# Patient Record
Sex: Female | Born: 1980
Health system: Southern US, Community
[De-identification: ages and names within clinical notes are randomized; demographics above are authoritative.]

## PROBLEM LIST (undated history)

## (undated) DIAGNOSIS — K429 Umbilical hernia without obstruction or gangrene: Secondary | ICD-10-CM

## (undated) DIAGNOSIS — J189 Pneumonia, unspecified organism: Secondary | ICD-10-CM

## (undated) DIAGNOSIS — M779 Enthesopathy, unspecified: Secondary | ICD-10-CM

## (undated) DIAGNOSIS — M1990 Unspecified osteoarthritis, unspecified site: Secondary | ICD-10-CM

## (undated) DIAGNOSIS — K2970 Gastritis, unspecified, without bleeding: Secondary | ICD-10-CM

## (undated) DIAGNOSIS — G2581 Restless legs syndrome: Secondary | ICD-10-CM

## (undated) DIAGNOSIS — Q909 Down syndrome, unspecified: Secondary | ICD-10-CM

## (undated) DIAGNOSIS — K219 Gastro-esophageal reflux disease without esophagitis: Secondary | ICD-10-CM

## (undated) DIAGNOSIS — K589 Irritable bowel syndrome without diarrhea: Secondary | ICD-10-CM

## (undated) DIAGNOSIS — E039 Hypothyroidism, unspecified: Secondary | ICD-10-CM

## (undated) DIAGNOSIS — Z9989 Dependence on other enabling machines and devices: Secondary | ICD-10-CM

## (undated) DIAGNOSIS — R519 Headache, unspecified: Secondary | ICD-10-CM

## (undated) DIAGNOSIS — K760 Fatty (change of) liver, not elsewhere classified: Secondary | ICD-10-CM

## (undated) DIAGNOSIS — G4730 Sleep apnea, unspecified: Secondary | ICD-10-CM

## (undated) DIAGNOSIS — N390 Urinary tract infection, site not specified: Secondary | ICD-10-CM

## (undated) DIAGNOSIS — R51 Headache: Secondary | ICD-10-CM

## (undated) DIAGNOSIS — Z8489 Family history of other specified conditions: Secondary | ICD-10-CM

## (undated) DIAGNOSIS — E079 Disorder of thyroid, unspecified: Secondary | ICD-10-CM

## (undated) DIAGNOSIS — L732 Hidradenitis suppurativa: Secondary | ICD-10-CM

## (undated) HISTORY — PX: HERNIA REPAIR: SHX51

## (undated) HISTORY — PX: AXILLARY HIDRADENITIS EXCISION: SUR522

## (undated) HISTORY — DX: Gastritis, unspecified, without bleeding: K29.70

## (undated) HISTORY — PX: TONSILLECTOMY: SUR1361

## (undated) HISTORY — DX: Disorder of thyroid, unspecified: E07.9

## (undated) HISTORY — DX: Down syndrome, unspecified: Q90.9

## (undated) HISTORY — DX: Umbilical hernia without obstruction or gangrene: K42.9

## (undated) HISTORY — DX: Enthesopathy, unspecified: M77.9

## (undated) HISTORY — PX: KNEE ARTHROSCOPY: SHX127

## (undated) HISTORY — PX: CHOLECYSTECTOMY: SHX55

## (undated) HISTORY — DX: Hidradenitis suppurativa: L73.2

## (undated) HISTORY — DX: Fatty (change of) liver, not elsewhere classified: K76.0

---

## 2007-09-07 ENCOUNTER — Encounter: Admission: RE | Admit: 2007-09-07 | Discharge: 2007-09-07 | Payer: Self-pay | Admitting: General Surgery

## 2007-10-19 ENCOUNTER — Inpatient Hospital Stay (HOSPITAL_COMMUNITY): Admission: RE | Admit: 2007-10-19 | Discharge: 2007-10-26 | Payer: Self-pay | Admitting: General Surgery

## 2007-10-19 ENCOUNTER — Ambulatory Visit: Payer: Self-pay | Admitting: Internal Medicine

## 2008-04-25 ENCOUNTER — Ambulatory Visit (HOSPITAL_COMMUNITY): Admission: RE | Admit: 2008-04-25 | Discharge: 2008-04-25 | Payer: Self-pay | Admitting: General Surgery

## 2008-06-20 ENCOUNTER — Encounter (INDEPENDENT_AMBULATORY_CARE_PROVIDER_SITE_OTHER): Payer: Self-pay | Admitting: General Surgery

## 2008-06-20 ENCOUNTER — Ambulatory Visit (HOSPITAL_COMMUNITY): Admission: RE | Admit: 2008-06-20 | Discharge: 2008-06-20 | Payer: Self-pay | Admitting: General Surgery

## 2009-11-17 ENCOUNTER — Encounter
Admission: RE | Admit: 2009-11-17 | Discharge: 2009-11-17 | Payer: Self-pay | Source: Home / Self Care | Attending: Emergency Medicine | Admitting: Emergency Medicine

## 2010-02-20 ENCOUNTER — Encounter: Admit: 2010-02-20 | Payer: Self-pay | Admitting: Emergency Medicine

## 2010-03-13 ENCOUNTER — Encounter: Payer: Medicare Other | Attending: Emergency Medicine | Admitting: *Deleted

## 2010-03-13 DIAGNOSIS — E119 Type 2 diabetes mellitus without complications: Secondary | ICD-10-CM | POA: Insufficient documentation

## 2010-03-13 DIAGNOSIS — Z713 Dietary counseling and surveillance: Secondary | ICD-10-CM | POA: Insufficient documentation

## 2010-03-13 DIAGNOSIS — E669 Obesity, unspecified: Secondary | ICD-10-CM | POA: Insufficient documentation

## 2010-04-10 ENCOUNTER — Ambulatory Visit: Payer: Medicare Other | Attending: Family Medicine | Admitting: Physical Therapy

## 2010-04-10 DIAGNOSIS — M25519 Pain in unspecified shoulder: Secondary | ICD-10-CM | POA: Insufficient documentation

## 2010-04-10 DIAGNOSIS — M25619 Stiffness of unspecified shoulder, not elsewhere classified: Secondary | ICD-10-CM | POA: Insufficient documentation

## 2010-04-10 DIAGNOSIS — IMO0001 Reserved for inherently not codable concepts without codable children: Secondary | ICD-10-CM | POA: Insufficient documentation

## 2010-04-13 ENCOUNTER — Ambulatory Visit: Payer: Medicare Other | Admitting: Physical Therapy

## 2010-04-16 ENCOUNTER — Ambulatory Visit: Payer: Medicare Other | Admitting: Physical Therapy

## 2010-04-19 ENCOUNTER — Ambulatory Visit: Payer: Medicare Other | Admitting: Physical Therapy

## 2010-04-23 ENCOUNTER — Ambulatory Visit: Payer: Medicare Other | Admitting: Physical Therapy

## 2010-04-26 ENCOUNTER — Ambulatory Visit: Payer: Medicare Other | Admitting: Physical Therapy

## 2010-05-03 ENCOUNTER — Ambulatory Visit: Payer: Medicare Other | Admitting: Physical Therapy

## 2010-05-04 ENCOUNTER — Ambulatory Visit: Payer: Medicare Other | Admitting: Physical Therapy

## 2010-05-15 LAB — CBC
Hemoglobin: 13.8 g/dL (ref 12.0–15.0)
MCV: 88.8 fL (ref 78.0–100.0)
RBC: 4.72 MIL/uL (ref 3.87–5.11)
WBC: 8.7 10*3/uL (ref 4.0–10.5)

## 2010-05-15 LAB — DIFFERENTIAL
Basophils Relative: 2 % — ABNORMAL HIGH (ref 0–1)
Eosinophils Relative: 1 % (ref 0–5)
Monocytes Relative: 6 % (ref 3–12)
Neutrophils Relative %: 64 % (ref 43–77)
Smear Review: ADEQUATE

## 2010-05-15 LAB — BASIC METABOLIC PANEL
CO2: 22 mEq/L (ref 19–32)
Calcium: 9.1 mg/dL (ref 8.4–10.5)
Chloride: 106 mEq/L (ref 96–112)
Creatinine, Ser: 0.63 mg/dL (ref 0.4–1.2)
GFR calc Af Amer: >60 mL/min (ref >60)
Sodium: 139 mEq/L (ref 135–145)

## 2010-05-15 LAB — PREGNANCY, URINE: Preg Test, Ur: NEGATIVE

## 2010-05-17 LAB — DIFFERENTIAL
Eosinophils Relative: 1 % (ref 0–5)
Lymphocytes Relative: 25 % (ref 12–46)
Lymphs Abs: 2.2 10*3/uL (ref 0.7–4.0)
Monocytes Absolute: 0.6 10*3/uL (ref 0.1–1.0)
Monocytes Relative: 7 % (ref 3–12)

## 2010-05-17 LAB — CBC
HCT: 36.3 % (ref 36.0–46.0)
Hemoglobin: 11.9 g/dL — ABNORMAL LOW (ref 12.0–15.0)
RBC: 4.09 MIL/uL (ref 3.87–5.11)
WBC: 8.8 10*3/uL (ref 4.0–10.5)

## 2010-05-17 LAB — BASIC METABOLIC PANEL
Chloride: 101 mEq/L (ref 96–112)
GFR calc non Af Amer: >60 mL/min (ref >60)
Potassium: 3.7 mEq/L (ref 3.5–5.1)
Sodium: 137 mEq/L (ref 135–145)

## 2010-05-17 LAB — PREGNANCY, URINE: Preg Test, Ur: NEGATIVE

## 2010-06-19 NOTE — Op Note (Signed)
NAME:  Oneill, Wanda                ACCOUNT NO.:  [id number]   MEDICAL RECORD NO.:  [id number]          PATIENT TYPE:  AMB   LOCATION:  DAY                          FACILITY:  [facility]   PHYSICIAN:  Juanetta Gosling, MDDATE OF BIRTH:  1980/04/11   DATE OF PROCEDURE:  06/20/2008  DATE OF DISCHARGE:                               OPERATIVE REPORT   PREOPERATIVE DIAGNOSIS:  Chronic left axillary hidradenitis.   POSTOPERATIVE DIAGNOSIS:  Chronic left axillary hidradenitis.   PROCEDURE:  Excision left axillary hidradenitis..   SURGEON:  Troy Sine. Dwain Sarna, M.D.   ASSISTANT:  None.   ANESTHESIA:  General.   SPECIMENS:  Axillary skin and subcutaneous tissue to pathology.   ESTIMATED BLOOD LOSS:  Minimal.   COMPLICATIONS:  None.   DRAINS:  None.   SUPERVISING ANESTHESIOLOGIST:  Quentin Cornwall. Council Mechanic, M.D.   DISPOSITION:  To recovery room in stable condition.   HISTORY:  [name] is a 30 year old female well known to me from a  laparoscopic ventral hernia repair in September 2009.  She has an area  of chronic hidradenitis in her left axilla that bothers her frequently  as well as cosmetically.  She and I, along with her parents, had a long  discussion about excising this area.  She was previously cancelled for  an upper respiratory tract infection, but on the day of surgery is doing  very well.  We discussed the risks and benefits of this procedure and  plan for an excision of her left axillary hidradenitis.   PROCEDURE:  After informed consent was obtained, the patient was first  brought to the holding area.  She had some significant difficulty in  obtaining an IV.  It was recommended by Anesthesia the next time she  undergoes the procedure that she have a PICC line or some other line  placed preoperatively.  Following this, she was then administered 400 mg  of ciprofloxacin and taken to the operating room where sequential  compression devices were placed on her lower extremities  prior to  induction.  She underwent general anesthesia without complication.  Her  left axilla and arm was then prepped with ChloraPrep and 3 minutes were  allowed to pass.  The area was then draped in the standard sterile  surgical fashion.  Surgical time-out was then performed.   An elliptical incision was then made to encompass the entire area that  appeared affected by the hidradenitis.  This was about a 13 x 5-cm area.  This area was excised down to the level of the fascia and the skin and  all the subcutaneous tissue was removed.  This appeared to remove all of  her disease.  This was then washed out.  Layered closure with 2-0 Vicryl  was then performed and the skin was then closed with multiple  interrupted vertical mattress 2-0 nylon sutures.  Bacitracin was applied  to the wound.  Ten mL of 0.25% Marcaine was infiltrated.  A sterile  dressing was then placed over her arm.  She tolerated this well, was  extubated in the operating room, and transferred to  PACU in stable  condition.      Juanetta Gosling, MD  Electronically Signed     MCW/MEDQ  D:  06/20/2008  T:  06/20/2008  Job:  [id number]   cc:   Clydie Braun L. Hal Hope, M.D.  Fax: [phone]

## 2010-06-19 NOTE — Op Note (Signed)
NAME:  Wanda Oneill, Wanda Oneill                ACCOUNT NO.:  [id number]   MEDICAL RECORD NO.:  [id number]          PATIENT TYPE:  INP   LOCATION:  1531                         FACILITY:  [facility]   PHYSICIAN:  Juanetta Gosling, MDDATE OF BIRTH:  1980-12-09   DATE OF PROCEDURE:  10/19/2007  DATE OF DISCHARGE:                               OPERATIVE REPORT   PREOPERATIVE DIAGNOSIS:  Incisional hernia.   POSTOPERATIVE DIAGNOSIS:  Incisional hernia x2.   PROCEDURE:  Laparoscopic ventral hernia repair with a Proceed mesh and  lysis of adhesions times 30 minutes.   SURGEON:  Juanetta Gosling, MD   ASSISTANT:  Clovis Pu. Cornett, M.D.   ANESTHESIA:  General.   FINDINGS:  Two small ventral hernias, one at the site of her prior  Ventralex hernia repair.   SPECIMENS:  None.   DRAINS:  None.   ESTIMATED BLOOD LOSS:  Minimal.   COMPLICATIONS:  None.   DISPOSITION:  To PACU in stable condition.   INDICATIONS:  This is a 30 year old with a past surgical history  significant for 3 abdominal hernia repairs, two of which she reports  were open and one laparoscopic.  She has had mesh placed in at least 2  of those.  She has Ventralex placed in one of the last operations.  Over  the last 3 years she has had increase in size of the supraumbilical  hernia and  feels some discomfort, no trouble with her bowel movements.  She does take  for some occasional complaints Bentyl.  She underwent a  CT scan that showed a small 2.5 cm supraumbilical hernia.  I counseled  her for laparoscopic ventral hernia repair with mesh with lysis of  adhesions.  We also discussed this may not cure her of some of the  symptoms she is having.   DESCRIPTION OF PROCEDURE:  After informed consent was obtained, the  patient was taken to the operating room.  She was administered 1 g of  vancomycin due to her penicillin allergy.  In the operating room  sequential compression devices were placed on her lower extremities and  then she was placed under general endotracheal anesthesia without  complication.  A Foley catheter and orogastric catheter were both  placed.  Her abdomen was prepped and draped in standard sterile surgical  fashion.  A surgical time out was then performed.  I then made a 5 mm  incision in her left upper quadrant and then I used the OptiVu trocar  and gained access to the abdomen without difficulty.  Her abdomen was  then insufflated to 15 mmHg pressure.  The camera was then inserted and  then I inserted a further 10 mm trocar into the left midabdomen under  direct vision after infiltration of local anesthetic without  complication.  Another 5 mm port was then placed in the left lower  quadrant after infiltration with local anesthetic without complication.  She was noted to have some adhesions to her abdominal wall.  These were  taken down with a combination of blunt dissection and sharp dissection  for approximately 30 minutes.  There was a small hernia noted just above  her umbilicus that was about 1.5 cm in size.  She was also noted to have  a small hernia just superior to her prior Ventralex repair.  I then used  the scissors to cut out the Ventralex mesh which was very scarred in and  difficult.  This was then removed through the 10 mm left midabdominal  trocar with some difficulty as this was very scarred and it was  basically a hard mass at that point.  Upon completion of this, I then  took the falciform ligament down with electrocautery.  There were two  defects approximately 4 cm apart. They were measured and I elected to  place an 8 x 10 inch Proceed mesh which would give me at least 4 cm  overlap in all directions.  I then placed 4-0 Prolene sutures in the  cardinal positions.  This was then placed intra-abdominally and  unrolled. The pressure was brought down to 7 and the position of the  mesh was then marked on the external portion of the abdomen.  Four stab  incisions were  made in the cardinal positions and a Storz suture passer  was then used to bring the Prolene suture through the abdominal wall.  Upon pulling these up, they were noted to be in good position.  The mesh  lay flat.  They were then tied down.  The Protacker was then used to  tack all around the edges in the double crown method.  The mesh was laid  flat and was in good position upon the completion of this.  To perform  the tacking portion, I did place one extra 5 mm trocar in her right mid  abdomen without difficulty.  Upon completion of this the camera was  moved to the right side.  I used a 0 Vicryl and the Endo close device to  then close the 10 mm trocar without difficulty.  I then looked around  the abdomen and there was no evidence of any injury from access and  hemostasis was observed.  The abdomen was then desufflated.  The trocars  were then removed without difficulty.  The trocar sites were then closed  with 4-0 Monocryl in the subcuticular fashion.  Dermabond was used then  to close all of these as well as the stab incisions from the sutures.  She tolerated this well, was extubated in the operating room.  Foley  catheter was removed.  She was transferred to the PACU in stable  condition.      Juanetta Gosling, MD  Electronically Signed     MCW/MEDQ  D:  10/19/2007  T:  10/20/2007  Job:  [id number]

## 2010-06-22 ENCOUNTER — Ambulatory Visit (INDEPENDENT_AMBULATORY_CARE_PROVIDER_SITE_OTHER): Payer: Medicare Other | Admitting: Family Medicine

## 2010-06-22 ENCOUNTER — Encounter: Payer: Self-pay | Admitting: Family Medicine

## 2010-06-22 DIAGNOSIS — G5750 Tarsal tunnel syndrome, unspecified lower limb: Secondary | ICD-10-CM

## 2010-06-22 DIAGNOSIS — R269 Unspecified abnormalities of gait and mobility: Secondary | ICD-10-CM

## 2010-06-22 DIAGNOSIS — M76829 Posterior tibial tendinitis, unspecified leg: Secondary | ICD-10-CM | POA: Insufficient documentation

## 2010-06-22 NOTE — Progress Notes (Signed)
  Subjective:    Patient ID: Wanda Oneill, female    DOB: 29-May-1980, 30 y.o.   MRN: [id number]  HPI 30 year old female with a history of Down's syndrome presents for evaluation of left foot and ankle pain. Her father is Derotha Fishbaugh, who is one of the urgent care doctors at [facility] urgent care.  Pain is in the left medial ankle and foot and has been present for about 2 weeks. She states she thinks she may have had some sort of slipping incident that stressed that part of her foot while she was at work. 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 Since then she has had gradual worsening pain in the left midfoot region that is worse with prolonged standing and walking and better with rest. It is a sharp throbbing pain. It does not wake her up at night. She's not having associated swelling, numbness, or tingling. She was initially evaluated by Dr. Warner Mccreedy at the urgent care and had x-rays, who subsequently referred her over here.  PMH: History of Down syndrome Social history: Works at [employer]. Does not smoke or drink. She lives by herself in an apartment.  Review of Systems Denies fever, sweats, chills, weight loss, shortness of breath, chest pain, numbness, tingling    Objective:   Physical Exam Gen. appearance well-appearing female no distress Neck: Supple Feet: She has mild pes cavus bilaterally and extremely broad forefoot. She has a fairly large gap between her first and second toes bilaterally. She has significant tenderness to palpation in the left medial midfoot in the region of the posterior tibial tendon insertion onto the navicular bone. She has no pain in the medial ankle. She has normal ankle range of motion and function. She is able to go up on both her toes with little pain on the left side. Gait: With walking, she has significant midfoot collapse left greater than right.  MSK [place]: Left foot and ankle shows normal posterior tibial tendon posterior to the medial malleolus. At the  insertion of the tendon onto the navicular bone, there is some mild edema and significant amount of increased Doppler flow. There is no evidence of cortical irregularity of the navicular bone.       Assessment & Plan:  Left midfoot pain, most likely insertional posterior tibial tendinitis - Given her amount of pain, we gave her a short Cam Walker boot - will also make custom orthotics to support her feet and I recommended she put the left orthotic in her boot for the time being -She can continue using the Voltaren gel that her dad has been giving her occasionally - we'll keep her out of work for next week, which is fortunate since she is going on vacation anyway for most of that time. She will return to work on May 29. - will followup with her in about 2-3 weeks to see how she's doing  Patient was fitted for a : standard, cushioned, semi-rigid orthotic. The orthotic was heated and afterward the patient stood on the orthotic blank positioned on the orthotic stand. The patient was positioned in subtalar neutral position and 10 degrees of ankle dorsiflexion in a weight bearing stance. After completion of molding, a stable base was applied to the orthotic blank. The blank was ground to a stable position for weight bearing. Size: 6 blue swirl Base: F3 plastic Posting: none Additional orthotic padding: none

## 2010-06-22 NOTE — Discharge Summary (Signed)
NAME:  Wanda Oneill, Wanda Oneill                ACCOUNT NO.:  [id number]   MEDICAL RECORD NO.:  [id number]          PATIENT TYPE:  INP   LOCATION:  1402                         [facility]   PHYSICIAN:  Juanetta Gosling, MDDATE OF BIRTH:  Jun 19, 1980   DATE OF ADMISSION:  10/19/2007  DATE OF DISCHARGE:  10/26/2007                               DISCHARGE SUMMARY   Wanda Oneill is a 30 year old female with  Down syndrome, who was admitted on  October 19, 2007 for repair of ventral hernia.  She has a prior  history of ventral hernia repairs and had a very symptomatic hernia.  She was admitted, underwent a laparoscopic ventral hernia repair with 8  x 10 inch Porcine mesh inserted, and also had extensive lysis of  adhesions and removal of a prior Ventralex patch that was in place.   Postoperatively, she had difficulty maintaining her oxygen saturations.  I obtained a pulmonary consultation from Dr. Sherene Sires.  She was eventually  begun on antibiotics for presumed pneumonia after undergoing a CT angio  of her chest, which showed no evidence of a pulmonary embolus.  Eventually with pulmonary toilet, she began to have better oxygen  saturations and was tolerating more of her activity.  Her diet was  advanced slowly to the point on postop day #7, she was able to go home  with  home oxygen set up.  Her wounds remained clean and her bowel  function had returned to normal by the time of her discharge.   PROCEDURES:  Laparoscopic ventral hernia repair with 8 x 10 Porcine  mesh.   CONSULTATIONS:  Pulmonary, Casimiro Needle B. Sherene Sires, MD, FCCP.   DISCHARGE:  Patient was doing fair and was discharged on Vicodin 5/500  one every 4 hours with the instructions to return in 1 week for  followup.      Juanetta Gosling, MD  Electronically Signed     MCW/MEDQ  D:  11/25/2007  T:  11/25/2007  Job:  [id number]

## 2010-07-06 ENCOUNTER — Ambulatory Visit (INDEPENDENT_AMBULATORY_CARE_PROVIDER_SITE_OTHER): Payer: Medicare Other | Admitting: Family Medicine

## 2010-07-06 ENCOUNTER — Encounter: Payer: Self-pay | Admitting: Family Medicine

## 2010-07-06 VITALS — BP 128/83 | HR 70

## 2010-07-06 DIAGNOSIS — M76829 Posterior tibial tendinitis, unspecified leg: Secondary | ICD-10-CM

## 2010-07-06 NOTE — Progress Notes (Signed)
  Subjective:    Patient ID: Wanda Oneill, female    DOB: [birth year], 30 y.o.   MRN: [id number]  HPI 30 year old female with history of Down syndrome here to followup on her left posterior tibial tendinitis. We made her orthotics at last visit and also put her in a Personal assistant. She's been back to work at [employer] 2 days this week, and had difficulty standing the entire 4 hours. She is with her mom today, and both feel the boot is hindering her work. She states the scaphoid pad we put on the right orthotic is causing more pain. She states otherwise her left medial foot and ankle does feel better as long as she is not standing on it.   Review of Systems Denies fever, sweats, chills, weight loss    Objective:   Physical Exam General appearance: Obese female no distress Left foot: Only mild tenderness along the medial midfoot at the insertion of the posterior tibial tendon at the navicular. No overlying erythema or warmth. Only minimal edema in the foot.       Assessment & Plan:  Posterior tibial tendinitis, improving -We took the right scaphoid pad off also since it was causing pain -She'll start working her way out of the [employer] -At this point she can followup with [place] as needed if her pain does not continue to improve or gets worse - Stress the importance overall continuing to use her custom orthotics in her normal tennis shoes

## 2010-08-21 ENCOUNTER — Ambulatory Visit (INDEPENDENT_AMBULATORY_CARE_PROVIDER_SITE_OTHER): Payer: Medicare Other | Admitting: Sports Medicine

## 2010-08-21 ENCOUNTER — Encounter: Payer: Self-pay | Admitting: Sports Medicine

## 2010-08-21 VITALS — BP 139/83 | HR 71

## 2010-08-21 DIAGNOSIS — M76829 Posterior tibial tendinitis, unspecified leg: Secondary | ICD-10-CM

## 2010-08-21 NOTE — Progress Notes (Signed)
PT referral faxed to [facility] at [facility] per pt request.

## 2010-08-21 NOTE — Progress Notes (Signed)
  Subjective:    Patient ID: Wanda Oneill, female    DOB: 1980/02/26, 30 y.o.   MRN: [id number]  HPI Posterior tibial tendinitis: Vicy initially noticed this injury in May of this year. She came to the sports medicine Center and was diagnosed with posterior tibial tendinitis.  Musculoskeletal ultrasound at that time showed edema around the posterior tibial tendon at its insertion in the navicular. She was placed in a CAM boot, given topical Voltaren, and custom orthotics were made. She was initially doing better and came to see [place] in June. At that point she was taken out of her cam boot. Unfortunately her pain has worsened, and at this point she experiences at both at work and at home. She notes that her pain occurs after approximately one hour of standing. She is is compliant with her orthotics. And she was very compliant through May with her cam boot. She has not done any formal therapy yet.   Review of Systems    negative except as noted above in the history of present illness. Objective:   Physical Exam    general: Well-developed, obese, Caucasian female. Musculoskeletal exam:   L Ankle: No visible erythema or swelling. Range of motion is full in all directions. Strength is 5/5 in all directions. We do reproduce pain with resisted inversion. She has no pain with great toe plantar flexion or small toe plantar flexion. Stable lateral and medial ligaments; squeeze test and kleiger test unremarkable; Talar dome nontender; No pain at base of 5th MT; No tenderness over cuboid; The patient has some tenderness over the navicular insertion of the posterior tibialis. No tenderness on posterior aspects of lateral and medial malleolus No sign of peroneal tendon subluxations or tenderness to palpation Negative tarsal tunnel tinel's  When viewed from the front, and standing, I note that her orthotics do not sufficiently control pronation of her left foot. I removed the existing scaphoid pad  from her left orthotic, and placed a larger felt scaphoid pad. This appeared to better control her pronation.  Assessment & Plan:

## 2010-08-21 NOTE — Assessment & Plan Note (Signed)
I suspect that she will need more time in orthosis that will control excess pronation. We will go ahead and place her in an ankle stabilizing orthosis, and she will wear this with her her newly builtup orthotics in her daily walking shoes. At this point I will place her in formal physical therapy for eccentric rehabilitation of her posterior tibialis tendon. She may also continue her oral ibuprofen and topical Voltaren. I would like to see her back in the office in one month to reassess her progress. Should she still had insufficient improvement in her symptoms, I would like to rescan her and likely get her back into her cam boot.

## 2010-08-21 NOTE — Patient Instructions (Signed)
Great to meet you today. I suspect that you have worsening of your posterior tibial tendinitis. We built up your orthotic today We will put you in an ASO brace today. I would also like you to see the physical therapist. Continue using the ibuprofen and topical Voltaren. I would like to see you back in one month to see how you are doing. Wanda Oneill. Benjamin Stain, M.D.

## 2010-08-28 ENCOUNTER — Ambulatory Visit: Payer: Medicare Other | Attending: Emergency Medicine | Admitting: Physical Therapy

## 2010-08-28 DIAGNOSIS — IMO0001 Reserved for inherently not codable concepts without codable children: Secondary | ICD-10-CM | POA: Insufficient documentation

## 2010-08-28 DIAGNOSIS — M25579 Pain in unspecified ankle and joints of unspecified foot: Secondary | ICD-10-CM | POA: Insufficient documentation

## 2010-08-28 DIAGNOSIS — M25673 Stiffness of unspecified ankle, not elsewhere classified: Secondary | ICD-10-CM | POA: Insufficient documentation

## 2010-08-28 DIAGNOSIS — R269 Unspecified abnormalities of gait and mobility: Secondary | ICD-10-CM | POA: Insufficient documentation

## 2010-08-28 DIAGNOSIS — M25676 Stiffness of unspecified foot, not elsewhere classified: Secondary | ICD-10-CM | POA: Insufficient documentation

## 2010-09-03 ENCOUNTER — Ambulatory Visit: Payer: Medicare Other | Admitting: Physical Therapy

## 2010-09-07 ENCOUNTER — Ambulatory Visit: Payer: Medicare Other | Attending: Emergency Medicine | Admitting: Physical Therapy

## 2010-09-07 DIAGNOSIS — IMO0001 Reserved for inherently not codable concepts without codable children: Secondary | ICD-10-CM | POA: Insufficient documentation

## 2010-09-07 DIAGNOSIS — M25673 Stiffness of unspecified ankle, not elsewhere classified: Secondary | ICD-10-CM | POA: Insufficient documentation

## 2010-09-07 DIAGNOSIS — M25676 Stiffness of unspecified foot, not elsewhere classified: Secondary | ICD-10-CM | POA: Insufficient documentation

## 2010-09-07 DIAGNOSIS — R269 Unspecified abnormalities of gait and mobility: Secondary | ICD-10-CM | POA: Insufficient documentation

## 2010-09-07 DIAGNOSIS — M25579 Pain in unspecified ankle and joints of unspecified foot: Secondary | ICD-10-CM | POA: Insufficient documentation

## 2010-09-17 ENCOUNTER — Ambulatory Visit: Payer: Medicare Other | Admitting: Physical Therapy

## 2010-09-18 ENCOUNTER — Ambulatory Visit (INDEPENDENT_AMBULATORY_CARE_PROVIDER_SITE_OTHER): Payer: Medicare Other | Admitting: Sports Medicine

## 2010-09-18 ENCOUNTER — Encounter: Payer: Self-pay | Admitting: Sports Medicine

## 2010-09-18 VITALS — BP 109/76 | HR 70

## 2010-09-18 DIAGNOSIS — M76829 Posterior tibial tendinitis, unspecified leg: Secondary | ICD-10-CM

## 2010-09-18 MED ORDER — NITROGLYCERIN 0.2 MG/HR TD PT24: MEDICATED_PATCH | TRANSDERMAL | Status: DC

## 2010-09-18 NOTE — Progress Notes (Signed)
  Subjective:    Patient ID: Wanda Oneill, female    DOB: 08-11-1980, 30 y.o.   MRN: [id number]  HPI Left posterior tibial tendinitis: Wanda Oneill initially noticed this injury in May of this year. She came to the sports medicine Center and was diagnosed with posterior tibial tendinitis.  Musculoskeletal ultrasound at that time showed edema around the posterior tibial tendon at its insertion in the navicular. She was placed in a CAM boot, given topical Voltaren, and custom orthotics were made. She was initially doing better and came to see [place] in June, she had been in the boot for less than a month. At that point she was taken out of her cam boot. I put her in anankle stabilizing orthosis, and referred her for formal physical therapy. She's been doing therapy for about 2 times per week now.  She notes that her pain is really overall not improved.    Review of Systems    negative except as noted above in the history of present illness Objective:   Physical Exam  L Ankle: No visible erythema or swelling. Range of motion is full in all directions. Strength is 5/5 in all directions. We do reproduce pain with resisted inversion. She has no pain with great toe plantar flexion or small toe plantar flexion. Stable lateral and medial ligaments; squeeze test and kleiger test unremarkable; Talar dome nontender; No pain at base of 5th MT; No tenderness over cuboid; No tenderness noted over the dorsal navicular The patient has some tenderness over the navicular insertion of the posterior tibialis. No tenderness on posterior aspects of lateral and medial malleolus No sign of peroneal tendon subluxations or tenderness to palpation Negative tarsal tunnel tinel's.    musculoskeletal ultrasonography: The posterior tibialis tendon still has some edema, however there are no tears or gaps in the tendon through its entire course. At its insertion in the navicular she does have some surrounding edema with signs of  periostitis with Doppler imaging over the navicular. When compared with previous images, the increased Doppler signal is greatly improved. I also did not seek neovessels in the tendon as were previously noted Assessment & Plan:

## 2010-09-18 NOTE — Assessment & Plan Note (Addendum)
Unfortunately she has not progressed as expected. I will put her back in the boot for 4 weeks, she may do physical therapy while in the boot. One quarter of a nitroglycerin patch to affected area daily A would like to see her back at that point and we will continue with therapy, and orthotics as before. Should she still not be doing better, it would be prudent to obtain an MRI of the left ankle, I am not that suspicious right now for a navicular stress fracture as the symptoms are not present on the dorsum of the foot, an MRI would further delineate this.

## 2010-09-19 ENCOUNTER — Ambulatory Visit: Payer: Medicare Other | Admitting: Physical Therapy

## 2010-09-25 ENCOUNTER — Ambulatory Visit: Payer: Medicare Other | Admitting: Physical Therapy

## 2010-09-27 ENCOUNTER — Ambulatory Visit: Payer: Medicare Other | Admitting: Physical Therapy

## 2010-10-03 ENCOUNTER — Ambulatory Visit: Payer: Medicare Other | Admitting: Physical Therapy

## 2010-10-05 ENCOUNTER — Ambulatory Visit: Payer: Medicare Other | Admitting: Physical Therapy

## 2010-10-09 ENCOUNTER — Ambulatory Visit: Payer: Medicare Other | Attending: Emergency Medicine | Admitting: Physical Therapy

## 2010-10-09 DIAGNOSIS — IMO0001 Reserved for inherently not codable concepts without codable children: Secondary | ICD-10-CM | POA: Insufficient documentation

## 2010-10-09 DIAGNOSIS — R269 Unspecified abnormalities of gait and mobility: Secondary | ICD-10-CM | POA: Insufficient documentation

## 2010-10-09 DIAGNOSIS — M25673 Stiffness of unspecified ankle, not elsewhere classified: Secondary | ICD-10-CM | POA: Insufficient documentation

## 2010-10-09 DIAGNOSIS — M25676 Stiffness of unspecified foot, not elsewhere classified: Secondary | ICD-10-CM | POA: Insufficient documentation

## 2010-10-09 DIAGNOSIS — M25579 Pain in unspecified ankle and joints of unspecified foot: Secondary | ICD-10-CM | POA: Insufficient documentation

## 2010-10-12 ENCOUNTER — Ambulatory Visit: Payer: Medicare Other | Admitting: Physical Therapy

## 2010-10-15 ENCOUNTER — Ambulatory Visit: Payer: Medicare Other | Admitting: Physical Therapy

## 2010-10-16 ENCOUNTER — Encounter: Payer: Self-pay | Admitting: Sports Medicine

## 2010-10-16 ENCOUNTER — Ambulatory Visit (INDEPENDENT_AMBULATORY_CARE_PROVIDER_SITE_OTHER): Payer: Medicare Other | Admitting: Sports Medicine

## 2010-10-16 VITALS — BP 127/77

## 2010-10-16 DIAGNOSIS — M76829 Posterior tibial tendinitis, unspecified leg: Secondary | ICD-10-CM

## 2010-10-16 NOTE — Assessment & Plan Note (Addendum)
Still not completely better after placing her in the boot for an additional month. I would have expected resolution of her posterior tibialis tendinitis by this point. She does have some tenderness over the navicular insertion of the posterior tibialis tendon, go ahead and get an MRI of the left foot to assess for a navicular stress fracture. She will continue using the nitroglycerin, and keep in the boot. Write her another note for 4 weeks out of work. She will continue physical therapy. She will come back to see me in one week to go over the results of the MRI. Should she have signs of a navicular stress fracture, and failure of therapy in the cam boot, I would put her in a formal cast.

## 2010-10-16 NOTE — Progress Notes (Deleted)
  Subjective:    Patient ID: Wanda Oneill, female    DOB: 26-Jun-1980, 30 y.o.   MRN: [id number]  HPI    Review of Systems     Objective:   Physical Exam        Assessment & Plan:   Subjective:    Patient ID: Wanda Oneill, female    DOB: 1980/03/28, 30 y.o.   MRN: [id number]  HPI Left posterior tibial tendinitis: Wanda Oneill returns for followup of this injury which was first noticed in May of this year. She came [facility] the [facility] and was diagnosed with posterior tibial tendinitis.  Musculoskeletal ultrasound at that time showed edema around the posterior tibial tendon at its insertion in the navicular. She was placed in a CAM boot, given topical Voltaren, and custom orthotics were made. She was initially doing better and came to see [place] in June, she had been in the boot for less than a month. At that point she was taken out of her cam boot. I put her in an ankle stabilizing orthosis at that point, and referred her for formal physical therapy. I then saw her approximately 1 month ago and she was not improved I put her in a boot again for an additional 4 weeks and we did an additional 4 weeks of physical therapy. She returns today approximately 40% better. She's been out of work, and is also been using the nitroglycerin patch.  Review of Systems    negative except as noted above in the history of present illness Objective:   Physical Exam  L Ankle: No visible erythema or swelling. Range of motion is full in all directions. Strength is 5/5 in all directions. We do reproduce pain with resisted inversion. She has no pain with great toe plantar flexion or small toe plantar flexion. Stable lateral and medial ligaments; squeeze test and kleiger test unremarkable; Talar dome nontender; No pain at base of 5th MT; No tenderness over cuboid; No tenderness noted over the dorsal navicular The patient still has some tenderness over the navicular insertion of the posterior  tibialis. No tenderness on posterior aspects of lateral and medial malleolus No sign of peroneal tendon subluxations or tenderness to palpation Negative tarsal tunnel tinel's.  Assessment & Plan:

## 2010-10-16 NOTE — Patient Instructions (Addendum)
Great to see you, Thanks for the brownies. MRI of the left foot. Continue physical therapy. Continue nitroglycerin patches. Make an appointment to see me in one week to go over the results of the MRI. MRI appt is Sept 17th at 10am at [facility].  Wanda Oneill. Benjamin Stain, M.D. [facility] [address] [place] [address] [phone]

## 2010-10-16 NOTE — Progress Notes (Signed)
  Subjective:    Patient ID: Wanda Oneill, female    DOB: [birth year], 30 y.o.   MRN: [id number]  HPI Left posterior tibial tendinitis: Wanda Oneill returns for followup of this injury which was first noticed in May of this year. She came to the sports medicine Center and was diagnosed with posterior tibial tendinitis.  Musculoskeletal ultrasound at that time showed edema around the posterior tibial tendon at its insertion in the navicular. She was placed in a CAM boot, given topical Voltaren, and custom orthotics were made. She was initially doing better and came to see [place] in June, she had been in the boot for less than a month. At that point she was taken out of her cam boot. I put her in an ankle stabilizing orthosis at that point, and referred her for formal physical therapy. I then saw her approximately 1 month ago and she was not improved I put her in a boot again for an additional 4 weeks and we did an additional 4 weeks of physical therapy. She returns today approximately 40% better. She's been out of work, and is also been using the nitroglycerin patch.  Review of Systems    negative except as noted above in the history of present illness Objective:   Physical Exam  L Ankle: No visible erythema or swelling. Range of motion is full in all directions. Strength is 5/5 in all directions. We do reproduce pain with resisted inversion. She has no pain with great toe plantar flexion or small toe plantar flexion. Stable lateral and medial ligaments; squeeze test and kleiger test unremarkable; Talar dome nontender; No pain at base of 5th MT; No tenderness over cuboid; No tenderness noted over the dorsal navicular The patient still has some tenderness over the navicular insertion of the posterior tibialis. No tenderness on posterior aspects of lateral and medial malleolus No sign of peroneal tendon subluxations or tenderness to palpation Negative tarsal tunnel tinel's.

## 2010-10-19 ENCOUNTER — Ambulatory Visit: Payer: Medicare Other | Admitting: Rehabilitation

## 2010-10-22 ENCOUNTER — Ambulatory Visit (HOSPITAL_COMMUNITY)
Admission: RE | Admit: 2010-10-22 | Discharge: 2010-10-22 | Disposition: A | Discharge status: Home / Self Care | Payer: Medicare Other | Source: Ambulatory Visit | Attending: Sports Medicine | Admitting: Sports Medicine

## 2010-10-22 DIAGNOSIS — M76829 Posterior tibial tendinitis, unspecified leg: Secondary | ICD-10-CM

## 2010-10-22 DIAGNOSIS — R609 Edema, unspecified: Secondary | ICD-10-CM | POA: Insufficient documentation

## 2010-10-22 DIAGNOSIS — M79609 Pain in unspecified limb: Secondary | ICD-10-CM | POA: Insufficient documentation

## 2010-10-22 DIAGNOSIS — M722 Plantar fascial fibromatosis: Secondary | ICD-10-CM | POA: Insufficient documentation

## 2010-10-23 ENCOUNTER — Ambulatory Visit: Payer: Medicare Other | Admitting: Physical Therapy

## 2010-10-23 ENCOUNTER — Ambulatory Visit (INDEPENDENT_AMBULATORY_CARE_PROVIDER_SITE_OTHER): Payer: Medicare Other | Admitting: Sports Medicine

## 2010-10-23 ENCOUNTER — Encounter: Payer: Self-pay | Admitting: Sports Medicine

## 2010-10-23 VITALS — BP 128/82

## 2010-10-23 DIAGNOSIS — M76829 Posterior tibial tendinitis, unspecified leg: Secondary | ICD-10-CM

## 2010-10-23 MED ORDER — PREDNISONE 20 MG PO TABS: 20.0000 mg | ORAL_TABLET | Freq: Two times a day (BID) | ORAL | Status: AC

## 2010-10-23 NOTE — Patient Instructions (Signed)
Out of work. Steroids 2x a day for 10 days. Check blood sugars daily while on steroids, call if >200. Come back to see me in 2 weeks. Airsport brace. Crutches. Ihor Austin. Benjamin Stain, M.D.

## 2010-10-23 NOTE — Progress Notes (Signed)
  Subjective:    Patient ID: Wanda Oneill, female    DOB: 1980-10-24, 30 y.o.   MRN: [id number]  HPI Left posterior tibial tendinitis: Wanda Oneill returns for followup of this injury which was first noticed in May of this year. She came to the sports medicine Center and was diagnosed with posterior tibial tendinitis.  Musculoskeletal ultrasound at that time showed edema around the posterior tibial tendon at its insertion in the navicular. She was placed in a CAM boot, given topical Voltaren, and custom orthotics were made. She was initially doing better and came to see [place] in June, she had been in the boot for less than a month. At that point she was taken out of her cam boot. I put her in an ankle stabilizing orthosis at that point, and referred her for formal physical therapy. I then saw her approximately 1.5 months ago and she was not improved I put her in a boot again for an additional 4 weeks and we did an additional 4 weeks of physical therapy. She returned approximately 40% better but still unable to work. She has also been using the nitroglycerin patch.  As this is been present for approximately 5 months, and her pain was predominantly at the insertion of the posterior tibialis tendon at the navicular, I had a concern for a navicular stress fracture. I obtained an MRI which we will review today.  She has had orthotics made sometime ago.   Review of Systems    thoroughly reviewed and negative with regards to the chief complaint Objective:   Physical Exam L Ankle: No visible erythema or swelling. Range of motion is full in all directions. Strength is 5/5 in all directions. We do reproduce pain with resisted inversion. She has no pain with great toe plantar flexion or small toe plantar flexion. Stable lateral and medial ligaments; squeeze test and kleiger test unremarkable; Talar dome nontender; No pain at base of 5th MT; No tenderness over cuboid; No tenderness noted over the dorsal  navicular The patient still has some tenderness over the navicular insertion of the posterior tibialis. No tenderness on posterior aspects of lateral and medial malleolus No sign of peroneal tendon subluxations or tenderness to palpation Negative tarsal tunnel tinel's.  MRI was reviewed independently by me and showed no stress injury to the navicular, there is radiographic plantar fasciitis, there is also posterior tibial tendinosis, with fluid around the sheath as well as increased T2 signal in the tendon substance itself.     Assessment & Plan:

## 2010-10-23 NOTE — Assessment & Plan Note (Signed)
We review the MRI results together, and I discussed the entire case with the patient's father Dr. Janace Hoard. I also discussed this case with Dr. Darrick Penna. I think it is reasonable to try an air sport brace to control excess pronation/supination. She will also try crutches. She may stop the nitroglycerin as there is no benefit yet. We will try a burst of prednisone, 20 mg twice a day for 10 days. She will check her blood sugars daily, and call us if they go over 200 during this period. She can continue physical therapy. They have requested that I see her back in a period of 2 weeks for another work note however I do not expect significant improvement in her symptoms at that point. Her father and mother have also requested that we consider a guided tendon sheath injection of corticosteroid should all of the above not provide any benefit. She would of course be nonweightbearing for at least one week if this procedure were done.

## 2010-10-24 ENCOUNTER — Ambulatory Visit: Payer: Medicare Other | Admitting: Physical Therapy

## 2010-10-29 ENCOUNTER — Ambulatory Visit: Payer: Medicare Other | Admitting: Physical Therapy

## 2010-10-31 ENCOUNTER — Ambulatory Visit: Payer: Medicare Other | Admitting: Physical Therapy

## 2010-11-05 ENCOUNTER — Ambulatory Visit: Payer: Medicare Other | Attending: Emergency Medicine | Admitting: Physical Therapy

## 2010-11-05 DIAGNOSIS — M25676 Stiffness of unspecified foot, not elsewhere classified: Secondary | ICD-10-CM | POA: Insufficient documentation

## 2010-11-05 DIAGNOSIS — M25579 Pain in unspecified ankle and joints of unspecified foot: Secondary | ICD-10-CM | POA: Insufficient documentation

## 2010-11-05 DIAGNOSIS — IMO0001 Reserved for inherently not codable concepts without codable children: Secondary | ICD-10-CM | POA: Insufficient documentation

## 2010-11-05 DIAGNOSIS — M25673 Stiffness of unspecified ankle, not elsewhere classified: Secondary | ICD-10-CM | POA: Insufficient documentation

## 2010-11-05 DIAGNOSIS — R269 Unspecified abnormalities of gait and mobility: Secondary | ICD-10-CM | POA: Insufficient documentation

## 2010-11-05 LAB — CBC
HCT: 26.3 — ABNORMAL LOW
HCT: 26.6 — ABNORMAL LOW
MCHC: 32.6
MCV: 89.2
MCV: 89.3
Platelets: 190
Platelets: 255
WBC: 12.2 — ABNORMAL HIGH
WBC: 12.8 — ABNORMAL HIGH

## 2010-11-05 LAB — BASIC METABOLIC PANEL
BUN: 4 — ABNORMAL LOW
BUN: 5 — ABNORMAL LOW
CO2: 30
CO2: 33 — ABNORMAL HIGH
Chloride: 100
Chloride: 102
Creatinine, Ser: 0.65
Potassium: 4.2

## 2010-11-06 ENCOUNTER — Encounter: Payer: Self-pay | Admitting: Sports Medicine

## 2010-11-06 ENCOUNTER — Ambulatory Visit (INDEPENDENT_AMBULATORY_CARE_PROVIDER_SITE_OTHER): Payer: Medicare Other | Admitting: Sports Medicine

## 2010-11-06 VITALS — BP 105/71 | HR 79

## 2010-11-06 DIAGNOSIS — M76829 Posterior tibial tendinitis, unspecified leg: Secondary | ICD-10-CM

## 2010-11-06 NOTE — Assessment & Plan Note (Signed)
Continue the air sport brace to control excess pronation/supination. May stop crutches. Continue physical therapy I will see her back in 3 months. I provided her a note to be out of work until November 5. We considered a guided tendon sheath injection of corticosteroid should her symptoms return, or her improvement plateau. She would of course be nonweightbearing for at least one week if this procedure were done.

## 2010-11-06 NOTE — Progress Notes (Signed)
  Subjective:    Patient ID: Wanda Oneill, female    DOB: 01/11/1981, 30 y.o.   MRN: [id number]  HPI Left posterior tibial tendinitis: Rielyn returns for followup of this injury which was first noticed in May of this year. She came to the sports medicine Center and was diagnosed with posterior tibial tendinitis.  Musculoskeletal ultrasound at that time showed edema around the posterior tibial tendon at its insertion in the navicular. She was placed in a CAM boot, given topical Voltaren, and custom orthotics were made. She was initially doing better and came to see [place] in June, she had been in the boot for less than a month. At that point she was taken out of her cam boot. I put her in an ankle stabilizing orthosis at that point, and referred her for formal physical therapy. I then saw her approximately 1.5 months ago and she was not improved I put her in a boot again for an additional 4 weeks and we did an additional 4 weeks of physical therapy. She returned approximately 40% better but still unable to work. She has also been using the nitroglycerin patch.  As this is been present for approximately 5 months, and her pain was predominantly at the insertion of the posterior tibialis tendon at the navicular, I had a concern for a navicular stress fracture. I obtained an MRI which was consistent with posterior tibial tendinopathy, and had no sign of stress fracture.  She has had orthotics made sometime ago.  We put her in an air sport brace, and gave her a burst of prednisone at the last visit. Today she returns and is significantly better.   Review of Systems    no fevers, chills, night sweats, weight loss Objective:   Physical Exam L Ankle: No visible erythema or swelling. Range of motion is full in all directions. Strength is 5/5 in all directions. We no longer reproduce pain with resisted inversion. She has no pain with great toe plantar flexion or small toe plantar flexion. Stable lateral and  medial ligaments; squeeze test and kleiger test unremarkable; Talar dome nontender; No pain at base of 5th MT; No tenderness over cuboid; No tenderness noted over the dorsal navicular The patient no longer has any tenderness over the navicular insertion of the posterior tibialis. No tenderness on posterior aspects of lateral and medial malleolus No sign of peroneal tendon subluxations or tenderness to palpation Negative tarsal tunnel tinel's.     Assessment & Plan:

## 2010-11-07 ENCOUNTER — Ambulatory Visit: Payer: Medicare Other | Admitting: Physical Therapy

## 2010-11-07 LAB — DIFFERENTIAL
Lymphocytes Relative: 32
Lymphs Abs: 2.5
Neutrophils Relative %: 60

## 2010-11-07 LAB — CBC
MCHC: 32.8
MCV: 88.2
RBC: 4.09

## 2010-11-07 LAB — COMPREHENSIVE METABOLIC PANEL
AST: 31
CO2: 28
Calcium: 9
Creatinine, Ser: 0.72
GFR calc Af Amer: >60
GFR calc non Af Amer: >60
Glucose, Bld: 113 — ABNORMAL HIGH

## 2010-11-07 LAB — URINE CULTURE
Colony Count: 25000
Special Requests: NEGATIVE

## 2010-11-07 LAB — URINALYSIS, ROUTINE W REFLEX MICROSCOPIC
Nitrite: NEGATIVE
Specific Gravity, Urine: 1.012
pH: 7

## 2010-11-07 LAB — PREGNANCY, URINE: Preg Test, Ur: NEGATIVE

## 2010-12-18 ENCOUNTER — Ambulatory Visit (INDEPENDENT_AMBULATORY_CARE_PROVIDER_SITE_OTHER): Payer: Medicare Other | Admitting: Sports Medicine

## 2010-12-18 VITALS — BP 118/77

## 2010-12-18 DIAGNOSIS — M76829 Posterior tibial tendinitis, unspecified leg: Secondary | ICD-10-CM

## 2010-12-18 MED ORDER — AIRCAST SPORT ANKLE BRACE/LEFT MISC: Status: DC

## 2010-12-18 NOTE — Progress Notes (Signed)
  Subjective:    Patient ID: Wanda Oneill, female    DOB: 08-Feb-1980, 30 y.o.   MRN: [id number]  HPI  Left posterior tibial tendinitis: Wanda Oneill returns for followup of this injury which was first noticed in May of this year. She came to the sports medicine Center and was diagnosed with posterior tibial tendinitis.  Musculoskeletal ultrasound at that time showed edema around the posterior tibial tendon at its insertion in the navicular. She was placed in a CAM boot, given topical Voltaren, and custom orthotics were made. She was initially doing better and came to see [place] in June, she had been in the boot for less than a month. At that point she was taken out of her cam boot. I put her in an ankle stabilizing orthosis at that point, and referred her for formal physical therapy. I then saw her approximately 1.5 months ago and she was not improved I put her in a boot again for an additional 4 weeks and we did an additional 4 weeks of physical therapy. She returned approximately 40% better but still unable to work. She has also been using the nitroglycerin patch.  As this is been present for approximately 5 months, and her pain was predominantly at the insertion of the posterior tibialis tendon at the navicular, I had a concern for a navicular stress fracture. I obtained an MRI which was consistent with posterior tibial tendinopathy, and had no sign of stress fracture.  She has had orthotics made sometime ago.  We put her in an air sport brace, and gave her a burst of prednisone at the last visit. She continues to do very well regarding her pain, and at this point has decided not to proceed with the posterior tibialis tendon sheath injection. 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 This will allow her to stay off of her feet, and she is really just not happy at [employer].  He is very happy with the Aircast. The problem is that his continue to wear down, and the support  material is now protruding. She would like a knot her Aircast, and a prescription for a custom Aircast to be made by local orthotist.   Review of Systems     no fevers, chills, night sweats, weight loss Objective:   Physical Exam  L Ankle: No visible erythema or swelling. Range of motion is full in all directions. Strength is 5/5 in all directions. We no longer reproduce pain with resisted inversion. She has no pain with great toe plantar flexion or small toe plantar flexion. Stable lateral and medial ligaments; squeeze test and kleiger test unremarkable; Talar dome nontender; No pain at base of 5th MT; No tenderness over cuboid; No tenderness noted over the dorsal navicular The patient no longer has any tenderness over the navicular insertion of the posterior tibialis. No tenderness on posterior aspects of lateral and medial malleolus No sign of peroneal tendon subluxations or tenderness to palpation Negative tarsal tunnel tinel's.     Assessment & Plan:

## 2010-12-18 NOTE — Assessment & Plan Note (Signed)
Continue the air sport brace to control excess pronation/supination. We again considered a guided tendon sheath injection of corticosteroid should her symptoms return, or her improvement plateau. She would of course be nonweightbearing for at least one week if this procedure were done. I've given her a new air sport brace. Have also given her a prescription for a custom Aircast to be made by local orthotist. We will see her back on an as-needed basis.

## 2010-12-26 ENCOUNTER — Other Ambulatory Visit: Payer: Self-pay | Admitting: Sports Medicine

## 2010-12-26 DIAGNOSIS — M76829 Posterior tibial tendinitis, unspecified leg: Secondary | ICD-10-CM

## 2010-12-26 MED ORDER — AIRCAST SPORT ANKLE BRACE/LEFT MISC: Status: DC

## 2011-01-08 ENCOUNTER — Ambulatory Visit (INDEPENDENT_AMBULATORY_CARE_PROVIDER_SITE_OTHER): Payer: Medicare Other | Admitting: General Surgery

## 2011-01-08 ENCOUNTER — Encounter (INDEPENDENT_AMBULATORY_CARE_PROVIDER_SITE_OTHER): Payer: Self-pay | Admitting: General Surgery

## 2011-01-08 VITALS — BP 114/76 | HR 76 | Temp 97.2°F | Resp 24 | Ht 64.0 in | Wt 270.8 lb

## 2011-01-08 DIAGNOSIS — R1013 Epigastric pain: Secondary | ICD-10-CM

## 2011-01-08 NOTE — Progress Notes (Signed)
Subjective:     Patient ID: Wanda Oneill, female   DOB: 08-20-1980, 30 y.o.   MRN: [id number]  HPI This is a 30 year old female no is well known to me after I performed a laparoscopic ventral hernia repair on her for a recurrent hernia in 2009. I reviewed my prior operation report from this. She also had a left axillary hidradenitis procedure done by me in 2010. She has done well since then. 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 She hurt her ankle the summer and this has been difficult for her. She also has a lot of concerns about her weight and her inability to lose weight we discussed at length today. She came in today after having what sounds like an episode of gastroenteritis in [place] recently. This has resolved. She apparently underwent a CAT scan while she was there that may or shown a recurrent hernia. She does complain she has a little bit of a bulge but this is difficult to tell due to her body habitus. She reports no other real complaints today.  Review of Systems     Objective:   Physical Exam  Constitutional: She appears well-developed and well-nourished.  Abdominal: Soft. Normal appearance. There is no tenderness. No hernia (she has a very weak abdominal wall but I do not definitively palpate a recurrent hernia).         Assessment:     Possible recurrent hernia    Plan:     There is a question of whether or not there is a recurrent hernia. It is difficult for me to tell my exam today and I don't really feel this today. I think the best plan is going to be to obtain her CAT scan she had in [place] for followup after that. She and I discussed this in detail today. We also had about a 30 minute discussion on her weight is well.

## 2011-01-23 ENCOUNTER — Ambulatory Visit (INDEPENDENT_AMBULATORY_CARE_PROVIDER_SITE_OTHER): Payer: Medicare Other

## 2011-01-23 DIAGNOSIS — I10 Essential (primary) hypertension: Secondary | ICD-10-CM

## 2011-01-23 DIAGNOSIS — E039 Hypothyroidism, unspecified: Secondary | ICD-10-CM

## 2011-01-23 DIAGNOSIS — E119 Type 2 diabetes mellitus without complications: Secondary | ICD-10-CM

## 2011-02-28 ENCOUNTER — Ambulatory Visit (INDEPENDENT_AMBULATORY_CARE_PROVIDER_SITE_OTHER): Payer: Medicare Other

## 2011-02-28 DIAGNOSIS — R300 Dysuria: Secondary | ICD-10-CM

## 2011-03-08 ENCOUNTER — Telehealth (INDEPENDENT_AMBULATORY_CARE_PROVIDER_SITE_OTHER): Payer: Self-pay

## 2011-03-08 NOTE — Telephone Encounter (Signed)
LMOM for pt to call me back. I want to give her the opinion of Dr Doreen Salvage after reviewing her records from out of town about a poss. Hernia. Per Dr Dwain Sarna he does not think this is a hernia and she should just watch this area and if anymore problems to call us for an appt.

## 2011-03-11 ENCOUNTER — Encounter (INDEPENDENT_AMBULATORY_CARE_PROVIDER_SITE_OTHER): Payer: Self-pay

## 2011-03-29 ENCOUNTER — Telehealth (INDEPENDENT_AMBULATORY_CARE_PROVIDER_SITE_OTHER): Payer: Self-pay

## 2011-03-29 NOTE — Telephone Encounter (Signed)
I called pt's mother Warden Fillers today after receiving letter from pt that she was wondering if we ever received her records from [place]. I explained that I did call the pt on 03-08-11 after DR Dwain Sarna reviewed her records from out of town but I had to leave a message. I asked the mother if I could save her contact # as the primary # and the pt with her mother agreed for me to change this in the computer. I did tell the pt's mother that Dr Dwain Sarna did not see a hernia on the records he reviewed from [place]. I advised for the pt to just watch the area but if becomes more painful to call us back. The mother relayed the message to the pt and the pt wants to make an appt now b/c having more pain at night to where she thinks she has a hernia now. I made an appt for the pt to see DR Dwain Sarna on 04-16-11.

## 2011-04-11 ENCOUNTER — Telehealth (INDEPENDENT_AMBULATORY_CARE_PROVIDER_SITE_OTHER): Payer: Self-pay

## 2011-04-11 NOTE — Telephone Encounter (Signed)
Called pt's mom to call me b/c I need to r/s pt's time for her appt date 3-12/ahs

## 2011-04-12 ENCOUNTER — Ambulatory Visit (INDEPENDENT_AMBULATORY_CARE_PROVIDER_SITE_OTHER): Payer: Medicare Other | Admitting: Family Medicine

## 2011-04-12 VITALS — BP 126/69 | HR 73 | Temp 98.7°F | Resp 16 | Ht 64.0 in | Wt 265.0 lb

## 2011-04-12 DIAGNOSIS — J330 Polyp of nasal cavity: Secondary | ICD-10-CM

## 2011-04-12 DIAGNOSIS — E1165 Type 2 diabetes mellitus with hyperglycemia: Secondary | ICD-10-CM | POA: Insufficient documentation

## 2011-04-12 DIAGNOSIS — E039 Hypothyroidism, unspecified: Secondary | ICD-10-CM | POA: Insufficient documentation

## 2011-04-12 DIAGNOSIS — IMO0002 Reserved for concepts with insufficient information to code with codable children: Secondary | ICD-10-CM | POA: Insufficient documentation

## 2011-04-12 DIAGNOSIS — IMO0001 Reserved for inherently not codable concepts without codable children: Secondary | ICD-10-CM

## 2011-04-12 MED ORDER — DICLOFENAC EPOLAMINE 1.3 % TD PTCH: MEDICATED_PATCH | TRANSDERMAL | Status: DC

## 2011-04-12 MED ORDER — BETAMETHASONE DIPROPIONATE AUG 0.05 % EX CREA: TOPICAL_CREAM | CUTANEOUS | Status: DC | PRN

## 2011-04-12 MED ORDER — SITAGLIPTIN PHOSPHATE 100 MG PO TABS: 100.0000 mg | ORAL_TABLET | Freq: Every day | ORAL | Status: DC

## 2011-04-12 MED ORDER — LEVOTHYROXINE SODIUM 137 MCG PO TABS: 137.0000 ug | ORAL_TABLET | Freq: Every day | ORAL | Status: DC

## 2011-04-12 MED ORDER — ALLOPURINOL 100 MG PO TABS: 100.0000 mg | ORAL_TABLET | Freq: Every day | ORAL | Status: DC

## 2011-04-12 NOTE — Progress Notes (Signed)
[facility]  4 skin tags prepped with alcohol. 2% lidocaine with epi injected to base of each tag. # 15 blade used for removal Drysol for hemostasis Mupirocin/Bandaid.  Pt tolerated this well.  Wound care reviewed

## 2011-04-12 NOTE — Progress Notes (Signed)
31 yo woman with right nasal polyp that has been annoying her for 3 months.  Seemed to improve a bit with nasal spray but never resolved and now is as annoying as ever.  Also, she has several skin tags on her neck that are irritating and she has a wedding to attend in May and she wants the  Tags to go away for ever.  O:  Small right nasal passage polyp  Multiple small right neck skin tags.  A:  Simple problems that should resolve  P: refer to ENT Dr. Nicholaus Corolla and remove the skin tags today.

## 2011-04-15 ENCOUNTER — Telehealth (INDEPENDENT_AMBULATORY_CARE_PROVIDER_SITE_OTHER): Payer: Self-pay

## 2011-04-15 NOTE — Telephone Encounter (Signed)
Moved the appt to 4:30 per Dr Dwain Sarna b/c a surgical case added in the middle of clinic.

## 2011-04-16 ENCOUNTER — Encounter (INDEPENDENT_AMBULATORY_CARE_PROVIDER_SITE_OTHER): Payer: Medicaid Other | Admitting: General Surgery

## 2011-04-16 ENCOUNTER — Ambulatory Visit (INDEPENDENT_AMBULATORY_CARE_PROVIDER_SITE_OTHER): Payer: Medicare Other | Admitting: General Surgery

## 2011-04-16 ENCOUNTER — Encounter (INDEPENDENT_AMBULATORY_CARE_PROVIDER_SITE_OTHER): Payer: Self-pay | Admitting: General Surgery

## 2011-04-16 DIAGNOSIS — R109 Unspecified abdominal pain: Secondary | ICD-10-CM

## 2011-04-16 NOTE — Progress Notes (Signed)
Subjective:     Patient ID: Wanda Oneill, female   DOB: 1980/04/02, 31 y.o.   MRN: [id number]  HPI Wanda Oneill is a 89 yof I know well from a laparoscopic repair of a small ventral hernia that recurred after primary repair.  This was a difficult postoperative course for her.  She has done well and I excised axillary hidradenitis as well.  I saw her last in December after she had an episode of abdominal pain and a possible recurrent hernia.  I did not feel a hernia when I saw her.  I obtained her notes and the ct from [place] and I did not see a hernia on her ct nor was one reported.  She was following up today for that but she states in last 2 weeks she noted a tender knot that appeared near one of her open incisions.  She has no trouble with her bowels now but this area is bothering her and she is concerned.  Review of Systems     Objective:   Physical Exam  Abdominal: There is tenderness.         Multiple well healed lap scars        Assessment:     ? Recurrent hernia vs abdominal wall mass    Plan:     I don't feel a definite hernia and I'm not sure exactly what this is.  I don't think is recurrent hernia but she certainly is at high risk given her abdominal wall and size.  I will send her to get a ct to see what this is and base further decisions on that.

## 2011-04-19 ENCOUNTER — Telehealth (INDEPENDENT_AMBULATORY_CARE_PROVIDER_SITE_OTHER): Payer: Self-pay

## 2011-04-19 NOTE — Telephone Encounter (Signed)
LMOM For pt's mom to call me so I can go over the CT appt.

## 2011-04-23 LAB — CBC WITH DIFFERENTIAL/PLATELET
Basophils Absolute: 0.1 K/uL (ref 0.0–0.1)
Basophils Relative: 1 % (ref 0–1)
Eosinophils Absolute: 0.1 K/uL (ref 0.0–0.7)
Eosinophils Relative: 1 % (ref 0–5)
HCT: 39 % (ref 36.0–46.0)
Hemoglobin: 12 g/dL (ref 12.0–15.0)
Lymphocytes Relative: 31 % (ref 12–46)
Lymphs Abs: 2.3 K/uL (ref 0.7–4.0)
MCH: 27.7 pg (ref 26.0–34.0)
MCHC: 30.8 g/dL (ref 30.0–36.0)
MCV: 90.1 fL (ref 78.0–100.0)
Monocytes Absolute: 0.6 K/uL (ref 0.1–1.0)
Monocytes Relative: 8 % (ref 3–12)
Neutro Abs: 4.6 K/uL (ref 1.7–7.7)
Neutrophils Relative %: 60 % (ref 43–77)
Platelets: 340 K/uL (ref 150–400)
RBC: 4.33 MIL/uL (ref 3.87–5.11)
RDW: 15.7 % — ABNORMAL HIGH (ref 11.5–15.5)
WBC: 7.6 K/uL (ref 4.0–10.5)

## 2011-04-24 ENCOUNTER — Ambulatory Visit (HOSPITAL_COMMUNITY)
Admission: RE | Admit: 2011-04-24 | Discharge: 2011-04-24 | Disposition: A | Discharge status: Home / Self Care | Payer: Medicare Other | Source: Ambulatory Visit | Attending: General Surgery | Admitting: General Surgery

## 2011-04-24 DIAGNOSIS — R109 Unspecified abdominal pain: Secondary | ICD-10-CM | POA: Insufficient documentation

## 2011-04-24 DIAGNOSIS — R160 Hepatomegaly, not elsewhere classified: Secondary | ICD-10-CM | POA: Insufficient documentation

## 2011-04-24 DIAGNOSIS — K439 Ventral hernia without obstruction or gangrene: Secondary | ICD-10-CM | POA: Insufficient documentation

## 2011-04-24 MED ORDER — IOHEXOL 300 MG/ML  SOLN
100.0000 mL | Freq: Once | INTRAMUSCULAR | Status: AC | PRN
  Administered 2011-04-24: 100 mL via INTRAVENOUS

## 2011-04-25 ENCOUNTER — Telehealth (INDEPENDENT_AMBULATORY_CARE_PROVIDER_SITE_OTHER): Payer: Self-pay | Admitting: General Surgery

## 2011-04-25 NOTE — Telephone Encounter (Signed)
Message copied by Liliana Cline on Thu Apr 25, 2011 12:16 PM ------      Message from: Dwain Sarna, MATTHEW      Created: Thu Apr 25, 2011  7:41 AM       Wanda Oneill,      She does have a small hernia.  This does not contain any bowel.  She is going to need to come back in preferably with her parents to talk.        MW      ----- Message -----         From: Rad Results In Interface         Sent: 04/24/2011   3:25 PM           To: Emelia Loron, MD

## 2011-04-25 NOTE — Telephone Encounter (Signed)
Patient and mother aware of CT results. Appt made for them to come in and discuss hernia repair.

## 2011-05-14 ENCOUNTER — Ambulatory Visit (INDEPENDENT_AMBULATORY_CARE_PROVIDER_SITE_OTHER): Payer: Medicare Other | Admitting: Family Medicine

## 2011-05-14 VITALS — BP 112/71 | HR 72 | Temp 98.3°F | Resp 18 | Ht 64.0 in | Wt 264.8 lb

## 2011-05-14 DIAGNOSIS — E119 Type 2 diabetes mellitus without complications: Secondary | ICD-10-CM

## 2011-05-14 DIAGNOSIS — M2550 Pain in unspecified joint: Secondary | ICD-10-CM

## 2011-05-14 DIAGNOSIS — L409 Psoriasis, unspecified: Secondary | ICD-10-CM

## 2011-05-14 DIAGNOSIS — L408 Other psoriasis: Secondary | ICD-10-CM

## 2011-05-14 LAB — URIC ACID: Uric Acid, Serum: 7.5 mg/dL — ABNORMAL HIGH (ref 2.4–7.0)

## 2011-05-14 MED ORDER — DICLOFENAC SODIUM 75 MG PO TBEC: 75.0000 mg | DELAYED_RELEASE_TABLET | Freq: Every day | ORAL | Status: AC | PRN

## 2011-05-14 NOTE — Progress Notes (Signed)
  Subjective:    Patient ID: Wanda Oneill, female    DOB: 09/30/1980, 31 y.o.   MRN: [id number]  HPI  Patient presents complaining of (L) knee pain. Patient's (R) knee can also bother her. Patient denies swelling or redness. Worse after prolong ambulation Knee feels like it could give way. Has never fallen. Diagnosed in the past with gout and on chronic allopurinol  (L) second digit pain- started today.  Very difficult to bend (L) second digit. "burns"  No repetitive tasks with (L) hand.  Psoriatic skin lesions   Review of Systems     Objective:   Physical Exam  Constitutional: She appears well-developed.  Neck: Neck supple. No thyromegaly present.  Cardiovascular: Normal rate, regular rhythm and normal heart sounds.   Pulmonary/Chest: Effort normal and breath sounds normal.  Musculoskeletal:       Right shoulder: She exhibits decreased range of motion.       Left knee: She exhibits normal range of motion, no swelling, no effusion and no erythema. tenderness (generalized) found. No medial joint line, no lateral joint line, no MCL and no LCL tenderness noted.  Neurological: She is alert.  Skin: Lesion (plaque lesions elbows) noted.      Results for orders placed in visit on 05/14/11  POCT SEDIMENTATION RATE      Component Value Range   POCT SED RATE 64 (*) 0 - 22 (mm/hr)  URIC ACID      Component Value Range   Uric Acid, Serum 7.5 (*) 2.4 - 7.0 (mg/dL)      Assessment & Plan:   1. Arthralgia  POCT SEDIMENTATION RATE, Uric acid, diclofenac (VOLTAREN) 75 MG EC tablet  2. Psoriasis  diclofenac (VOLTAREN) 75 MG EC tablet  3. DM type 2 (diabetes mellitus, type 2)     Trial of NSAIDS Follow up if symptoms persist after use of antiinflammatories

## 2011-05-23 ENCOUNTER — Ambulatory Visit (INDEPENDENT_AMBULATORY_CARE_PROVIDER_SITE_OTHER): Payer: Medicare Other | Admitting: General Surgery

## 2011-05-23 ENCOUNTER — Encounter (INDEPENDENT_AMBULATORY_CARE_PROVIDER_SITE_OTHER): Payer: Self-pay | Admitting: General Surgery

## 2011-05-23 DIAGNOSIS — R109 Unspecified abdominal pain: Secondary | ICD-10-CM

## 2011-05-23 NOTE — Progress Notes (Signed)
Subjective:     Patient ID: Wanda Oneill, female   DOB: Jun 13, 1980, 31 y.o.   MRN: [id number]  HPI 67 yof who I know well from lvh repair with mesh. She returns today with her mom to review ct results.   Review of Systems CT ABDOMEN AND PELVIS WITH CONTRAST  Technique: Multidetector CT imaging of the abdomen and pelvis was  performed following the standard protocol during bolus  administration of intravenous contrast.  Contrast: OMNIPAQUE IOHEXOL 300 MG/ML IJ SOLN  Comparison: [place] [facility] at [address] abdomen and pelvis  CT exam from 09/07/2007.  Findings: The liver is enlarged, measuring 22.6 cm in cranial  caudal length. No focal abnormalities seen in the liver or spleen.  Stomach, duodenum, pancreas, and adrenal glands are unremarkable.  The gallbladder is surgically absent. Kidneys have normal imaging  features.  No abdominal aortic aneurysm. There is no free fluid or  lymphadenopathy in the abdomen. There is evidence of ventral mesh  placement. There is a 1.7 cm ventral hernia in a right paramidline  location, superficial to the mesh. This tiny hernia sac contains  only omental fat and is positioned just to the right of the midline  about 5.5 cm cranial to the umbilicus.  Imaging through the pelvis shows no free intraperitoneal fluid.  There is no pelvic sidewall lymphadenopathy. There are some mildly  prominent lymph nodes in both inguinal regions and there is  associated superficial vascular collateralization in the  anteromedial aspect of each upper thigh, incompletely visualized.  Uterus is unremarkable. There is no adnexal mass. No colonic  diverticulitis. Terminal ileum is normal. The appendix is normal.  Bone windows reveal no worrisome lytic or sclerotic osseous  lesions.  IMPRESSION:  1.7 cm right paramidline hernia contains only fat. No evidence for  incarceration of fat necrosis at this time.     Objective:   Physical Exam Unchanged from  last visit, I do not feel a discrete hernia    Assessment:     Abdominal pain    Plan:     I have reviewed her CT scan as well as looking at it with a couple my other partners. When I look at her CT scan in relation to the operation that I did previously I do not think this is a recurrent hernia right now. I think that this area as it is read as a 1.7 cm hernia is just her hernia sac that is sitting above the mesh that previously placed. This goes along also with the fact that I do not feel a discrete defect on her exam. She had a lot of trouble after this first operation. I am hesitant to take her to the operating room unless I am absolutely sure there is something I can repair. I think it would be reasonable just to see her back in 3 months to see how she is doing as well. They also inquired about weight loss surgery and I will ask one of my partners to see if they be willing to see her to evaluate her for this as I think that that would help considerably both in the formation of hernias as well as some upper abdominal pain. She has her sister's wedding coming up soon and they're going to give me a call back to let me know what they like to do but otherwise I will plan on seeing her back in 3 months.

## 2011-05-28 ENCOUNTER — Ambulatory Visit (INDEPENDENT_AMBULATORY_CARE_PROVIDER_SITE_OTHER): Payer: Medicare Other | Admitting: Sports Medicine

## 2011-05-28 VITALS — BP 117/77

## 2011-05-28 DIAGNOSIS — M76829 Posterior tibial tendinitis, unspecified leg: Secondary | ICD-10-CM

## 2011-05-28 NOTE — Progress Notes (Signed)
  Subjective:    Patient ID: Wanda Oneill, female    DOB: 1980-12-12, 31 y.o.   MRN: [id number]  HPI Wanda Oneill returns for followup of her left tibialis posterior tendinitis.  She is localized her pain behind the medial malleolus, noted it was worse with being on her feet for a long time, and noted that it radiated medially to the navicular as well as proximally up her leg. I have been seeing her for almost a year now, and we worked through rehabilitation with formal therapy, MRI, nitroglycerin patches, oral NSAIDs. She's been to various braces, and finally has a custom air sport brace. Overall she is essentially pain-free at this point.   Review of Systems    No fevers, chills, night sweats, weight loss, chest pain, or shortness of breath.  Social History: Non-smoker. Objective:   Physical Exam General:  Well developed, well nourished, and in no acute distress. Neuro:  Alert and oriented x3, extra-ocular muscles intact. Skin: Warm and dry, no rashes noted. Respiratory:  Not using accessory muscles, speaking in full sentences. Musculoskeletal: Left Ankle: No visible erythema or swelling. Range of motion is full in all directions. Strength is 5/5 in all directions. Stable lateral and medial ligaments; squeeze test and kleiger test unremarkable; Talar dome nontender; No pain at base of 5th MT; No tenderness over cuboid; No tenderness over N spot or navicular prominence No tenderness on posterior aspects of lateral and medial malleolus No sign of peroneal tendon subluxations or tenderness to palpation Negative tarsal tunnel tinel's Able to walk 4 steps.     Assessment & Plan:

## 2011-05-28 NOTE — Assessment & Plan Note (Addendum)
Doing much better with custom brace, exercises, and oral mediations. Cont rehab exercises daily. Brace when out and about. RTC 2 months.

## 2011-07-02 ENCOUNTER — Other Ambulatory Visit: Payer: Self-pay | Admitting: Physician Assistant

## 2011-07-02 NOTE — Telephone Encounter (Signed)
Please advise Wanda Oneill that we need to see her and update her TSH before she runs out!

## 2011-07-03 ENCOUNTER — Ambulatory Visit (INDEPENDENT_AMBULATORY_CARE_PROVIDER_SITE_OTHER): Payer: Medicare Other | Admitting: Emergency Medicine

## 2011-07-03 ENCOUNTER — Ambulatory Visit: Payer: Medicare Other

## 2011-07-03 VITALS — BP 124/78 | HR 80 | Temp 98.2°F | Resp 16 | Ht 64.0 in | Wt 263.2 lb

## 2011-07-03 DIAGNOSIS — E039 Hypothyroidism, unspecified: Secondary | ICD-10-CM

## 2011-07-03 DIAGNOSIS — M25569 Pain in unspecified knee: Secondary | ICD-10-CM

## 2011-07-03 DIAGNOSIS — E119 Type 2 diabetes mellitus without complications: Secondary | ICD-10-CM

## 2011-07-03 LAB — COMPREHENSIVE METABOLIC PANEL
ALT: 38 U/L — ABNORMAL HIGH (ref 0–35)
AST: 30 U/L (ref 0–37)
Albumin: 4 g/dL (ref 3.5–5.2)
BUN: 13 mg/dL (ref 6–23)
Calcium: 9.5 mg/dL (ref 8.4–10.5)
Chloride: 98 mEq/L (ref 96–112)
Potassium: 4.2 mEq/L (ref 3.5–5.3)
Sodium: 136 mEq/L (ref 135–145)
Total Protein: 7.4 g/dL (ref 6.0–8.3)

## 2011-07-03 LAB — TSH: TSH: 3.669 u[IU]/mL (ref 0.350–4.500)

## 2011-07-03 MED ORDER — LEVOTHYROXINE SODIUM 137 MCG PO TABS: 137.0000 ug | ORAL_TABLET | Freq: Every day | ORAL | Status: DC

## 2011-07-03 NOTE — Progress Notes (Signed)
  Subjective:    Patient ID: Wanda Oneill, female    DOB: 02-01-1981, 31 y.o.   MRN: [id number]  HPI patient enters for followup of her diabetes. Sugars have been running high. She's had some orthopedic issues and has not been able to exercise as much his previous.    Review of Systems she recently has had some left knee discomfort. She has some difficulty walking. When she took the anti-inflammatory she felt well.     Objective:   Physical Exam HEENT exam is her neck is supple. Chest is clear to heart regular rate no murmurs. Abdomen soft nontender. Examination of the left knee reveals pain with McMurray testing.  Results for orders placed in visit on 07/03/11  POCT GLYCOSYLATED HEMOGLOBIN (HGB A1C)      Component Value Range   Hemoglobin A1C 6.5    GLUCOSE, POCT (MANUAL RESULT ENTRY)      Component Value Range   POC Glucose 118 (*) 70 - 99 (mg/dl)   [facility] reading (PRIMARY) by  Dr.Lockie Bothun NAD       Assessment & Plan:  Patient has poor control of her diabetes and intolerance to metformin. We'll get films of her left knee if not already done the would consider short course of nonsteroidals to see if this helps.

## 2011-07-07 ENCOUNTER — Encounter: Payer: Self-pay | Admitting: Family Medicine

## 2011-07-08 ENCOUNTER — Encounter (INDEPENDENT_AMBULATORY_CARE_PROVIDER_SITE_OTHER): Payer: Self-pay | Admitting: General Surgery

## 2011-07-10 ENCOUNTER — Other Ambulatory Visit: Payer: Self-pay | Admitting: Emergency Medicine

## 2011-07-10 DIAGNOSIS — M109 Gout, unspecified: Secondary | ICD-10-CM

## 2011-07-10 MED ORDER — ALLOPURINOL 100 MG PO TABS: 100.0000 mg | ORAL_TABLET | Freq: Two times a day (BID) | ORAL | Status: DC

## 2011-07-30 ENCOUNTER — Ambulatory Visit: Payer: Medicare Other | Admitting: Sports Medicine

## 2011-08-09 ENCOUNTER — Other Ambulatory Visit: Payer: Self-pay | Admitting: Physician Assistant

## 2011-09-09 ENCOUNTER — Other Ambulatory Visit: Payer: Self-pay | Admitting: Family Medicine

## 2011-09-09 DIAGNOSIS — E039 Hypothyroidism, unspecified: Secondary | ICD-10-CM

## 2011-09-09 DIAGNOSIS — R059 Cough, unspecified: Secondary | ICD-10-CM

## 2011-09-09 DIAGNOSIS — E119 Type 2 diabetes mellitus without complications: Secondary | ICD-10-CM

## 2011-09-09 DIAGNOSIS — N946 Dysmenorrhea, unspecified: Secondary | ICD-10-CM

## 2011-09-09 DIAGNOSIS — M79673 Pain in unspecified foot: Secondary | ICD-10-CM

## 2011-09-09 DIAGNOSIS — R05 Cough: Secondary | ICD-10-CM

## 2011-09-09 MED ORDER — NORGESTIMATE-ETH ESTRADIOL 0.25-35 MG-MCG PO TABS: 1.0000 | ORAL_TABLET | Freq: Every day | ORAL | Status: DC

## 2011-09-09 MED ORDER — TRAMADOL HCL 50 MG PO TABS: 50.0000 mg | ORAL_TABLET | Freq: Three times a day (TID) | ORAL | Status: AC | PRN

## 2011-09-09 MED ORDER — BENZONATATE 100 MG PO CAPS: 100.0000 mg | ORAL_CAPSULE | Freq: Three times a day (TID) | ORAL | Status: DC | PRN

## 2011-09-09 MED ORDER — PIOGLITAZONE HCL 30 MG PO TABS: 30.0000 mg | ORAL_TABLET | Freq: Every day | ORAL | Status: DC

## 2011-09-09 MED ORDER — LEVOTHYROXINE SODIUM 137 MCG PO TABS: 137.0000 ug | ORAL_TABLET | Freq: Every day | ORAL | Status: DC

## 2011-09-09 NOTE — Progress Notes (Signed)
Asked by patient's mother to refill some of her medications. She is stable on her medications- needs refills of her tramadol (takes about 4 a week for chronic foot pain).  Also refilled sprintec, actos, synthroid and bently

## 2011-09-12 ENCOUNTER — Encounter: Payer: Self-pay | Admitting: Emergency Medicine

## 2011-09-26 ENCOUNTER — Other Ambulatory Visit: Payer: Self-pay | Admitting: Internal Medicine

## 2011-10-21 ENCOUNTER — Ambulatory Visit (INDEPENDENT_AMBULATORY_CARE_PROVIDER_SITE_OTHER): Payer: Medicare Other | Admitting: Emergency Medicine

## 2011-10-21 VITALS — BP 131/74 | HR 65 | Temp 98.1°F | Resp 18 | Ht 64.0 in | Wt 270.0 lb

## 2011-10-21 DIAGNOSIS — Z9109 Other allergy status, other than to drugs and biological substances: Secondary | ICD-10-CM

## 2011-10-21 DIAGNOSIS — K589 Irritable bowel syndrome without diarrhea: Secondary | ICD-10-CM

## 2011-10-21 DIAGNOSIS — K469 Unspecified abdominal hernia without obstruction or gangrene: Secondary | ICD-10-CM

## 2011-10-21 DIAGNOSIS — M25559 Pain in unspecified hip: Secondary | ICD-10-CM

## 2011-10-21 DIAGNOSIS — J309 Allergic rhinitis, unspecified: Secondary | ICD-10-CM

## 2011-10-21 DIAGNOSIS — IMO0001 Reserved for inherently not codable concepts without codable children: Secondary | ICD-10-CM

## 2011-10-21 DIAGNOSIS — G8929 Other chronic pain: Secondary | ICD-10-CM

## 2011-10-21 DIAGNOSIS — N946 Dysmenorrhea, unspecified: Secondary | ICD-10-CM

## 2011-10-21 DIAGNOSIS — E119 Type 2 diabetes mellitus without complications: Secondary | ICD-10-CM

## 2011-10-21 DIAGNOSIS — K439 Ventral hernia without obstruction or gangrene: Secondary | ICD-10-CM | POA: Insufficient documentation

## 2011-10-21 DIAGNOSIS — E039 Hypothyroidism, unspecified: Secondary | ICD-10-CM

## 2011-10-21 DIAGNOSIS — L408 Other psoriasis: Secondary | ICD-10-CM

## 2011-10-21 DIAGNOSIS — L409 Psoriasis, unspecified: Secondary | ICD-10-CM | POA: Insufficient documentation

## 2011-10-21 DIAGNOSIS — M109 Gout, unspecified: Secondary | ICD-10-CM

## 2011-10-21 DIAGNOSIS — Z309 Encounter for contraceptive management, unspecified: Secondary | ICD-10-CM

## 2011-10-21 LAB — LIPASE: Lipase: 30 U/L (ref 0–75)

## 2011-10-21 LAB — COMPREHENSIVE METABOLIC PANEL
Alkaline Phosphatase: 54 U/L (ref 39–117)
Creat: 0.71 mg/dL (ref 0.50–1.10)
Glucose, Bld: 105 mg/dL — ABNORMAL HIGH (ref 70–99)
Sodium: 138 mEq/L (ref 135–145)
Total Bilirubin: 0.3 mg/dL (ref 0.3–1.2)
Total Protein: 7.2 g/dL (ref 6.0–8.3)

## 2011-10-21 LAB — AMYLASE: Amylase: 32 U/L (ref 0–105)

## 2011-10-21 LAB — GLUCOSE, POCT (MANUAL RESULT ENTRY): POC Glucose: 96 mg/dl (ref 70–99)

## 2011-10-21 MED ORDER — SITAGLIPTIN PHOSPHATE 100 MG PO TABS: 100.0000 mg | ORAL_TABLET | Freq: Every day | ORAL | Status: DC

## 2011-10-21 MED ORDER — ALLOPURINOL 100 MG PO TABS: 100.0000 mg | ORAL_TABLET | Freq: Two times a day (BID) | ORAL | Status: DC

## 2011-10-21 MED ORDER — PIOGLITAZONE HCL 30 MG PO TABS
30.0000 mg | ORAL_TABLET | Freq: Every day | ORAL | Status: DC
Start: 2011-10-21 — End: 2012-10-15

## 2011-10-21 MED ORDER — BETAMETHASONE DIPROPIONATE AUG 0.05 % EX CREA: TOPICAL_CREAM | Freq: Two times a day (BID) | CUTANEOUS | Status: DC

## 2011-10-21 MED ORDER — TRAMADOL HCL 50 MG PO TABS: 50.0000 mg | ORAL_TABLET | Freq: Four times a day (QID) | ORAL | Status: DC | PRN

## 2011-10-21 MED ORDER — FLUTICASONE PROPIONATE 50 MCG/ACT NA SUSP: 2.0000 | Freq: Every day | NASAL | Status: DC

## 2011-10-21 MED ORDER — NORGESTIMATE-ETH ESTRADIOL 0.25-35 MG-MCG PO TABS: 1.0000 | ORAL_TABLET | Freq: Every day | ORAL | Status: DC

## 2011-10-21 MED ORDER — DICYCLOMINE HCL 10 MG PO CAPS: 20.0000 mg | ORAL_CAPSULE | Freq: Two times a day (BID) | ORAL | Status: DC

## 2011-10-21 MED ORDER — LEVOTHYROXINE SODIUM 137 MCG PO TABS: 137.0000 ug | ORAL_TABLET | Freq: Every day | ORAL | Status: DC

## 2011-10-21 MED ORDER — METFORMIN HCL ER 500 MG PO TB24: 500.0000 mg | ORAL_TABLET | Freq: Every day | ORAL | Status: DC

## 2011-10-21 MED ORDER — METFORMIN HCL ER 500 MG PO TB24: ORAL_TABLET | ORAL | Status: DC

## 2011-10-21 NOTE — Progress Notes (Signed)
  Subjective:    Patient ID: Wanda Oneill, female    DOB: May 09, 1980, 31 y.o.   MRN: [id number]  HPI Patient presents today with a follow-up for diabetes. She has been trying to exercise more. She states she has days that are good for dieting and some days are not.     Review of Systems     Objective:   Physical Exam        Assessment & Plan:

## 2011-10-21 NOTE — Progress Notes (Signed)
  Subjective:    Patient ID: Jeryl Columbia, female    DOB: [birth year], 31 y.o.   MRN: [id number]  HPI patient in for followup of her diabetes. She has been doing well at home on her current medications. She continues to struggle with weight loss. She currently is on Actos and Januvia. She would like a referral back to her general surgeon to reevaluate her abdominal wall hernia. She's had chronic problems with myalgias. Lyme titer ordered    Review of Systems     Objective:   Physical Exam HEENT exam is unremarkable neck supple thyroid borderline-enlarged chest clear heart regular rate no murmurs extremity exam reveals no ulcerations normal  Results for orders placed in visit on 10/21/11  POCT GLYCOSYLATED HEMOGLOBIN (HGB A1C)      Component Value Range   Hemoglobin A1C 6.5    GLUCOSE, POCT (MANUAL RESULT ENTRY)      Component Value Range   POC Glucose 96  70 - 99 mg/dl        Assessment & Plan:  We'll start Glucophage XL 500 half a tablet a day and see she can tolerate this and hopefully protect her from side effects of Januvia . Routine labs were done today. All meds refilled.

## 2011-11-05 ENCOUNTER — Ambulatory Visit (INDEPENDENT_AMBULATORY_CARE_PROVIDER_SITE_OTHER): Payer: Medicare Other | Admitting: Emergency Medicine

## 2011-11-05 VITALS — BP 115/75 | HR 71 | Temp 98.0°F | Resp 18 | Ht 64.0 in | Wt 272.2 lb

## 2011-11-05 DIAGNOSIS — M545 Low back pain, unspecified: Secondary | ICD-10-CM

## 2011-11-05 DIAGNOSIS — A09 Infectious gastroenteritis and colitis, unspecified: Secondary | ICD-10-CM

## 2011-11-05 DIAGNOSIS — Q909 Down syndrome, unspecified: Secondary | ICD-10-CM

## 2011-11-05 LAB — POCT URINALYSIS DIPSTICK
Bilirubin, UA: NEGATIVE
Ketones, UA: NEGATIVE
Spec Grav, UA: <=1.005

## 2011-11-05 LAB — POCT UA - MICROSCOPIC ONLY: Mucus, UA: NEGATIVE

## 2011-11-05 MED ORDER — LOPERAMIDE HCL 2 MG PO TABS: 2.0000 mg | ORAL_TABLET | Freq: Four times a day (QID) | ORAL | Status: DC | PRN

## 2011-11-05 NOTE — Progress Notes (Signed)
[facility] [address], [place] [address] [phone]  Date:  11/05/2011   Name:  Wanda Oneill   DOB:  Apr 21, 1980   MRN:  [id number]  PCP:  Lucilla Edin, MD    Chief Complaint: Back Pain and Diarrhea   History of Present Illness:  Wanda Oneill is a 31 y.o. very pleasant female patient who presents with the following:  Multiple complaints.  Here with 4 days of diarrhea with no blood mucous or pus in stool. No fever or chills.  No improvement with non medical treatment. Mother ill last week with same.  No travel or questionable water consumption.  Now has developed low back pain.  No radiation, no dysuria, urgency or frequency.  Breaking through her usual pain medication. No weakness or tingling.  No history of injury or overuse.  Patient Active Problem List  Diagnosis  . Gait abnormality  . Tibial tendinitis, posterior  . Type 2 diabetes mellitus, uncontrolled  . Hypothyroid  . Hernia of abdominal wall  . Psoriasis    Past Medical History  Diagnosis Date  . Diabetes mellitus     type II  . Thyroid disease     hypothyroidism  . Down syndrome   . Hidradenitis   . Tendonitis     chronic in left foot  . Cancer     thyroid    Past Surgical History  Procedure Date  . Cholecystectomy   . Axillary hidradenitis excision   . Hernia repair     lvh with mesh    History  Substance Use Topics  . Smoking status: Never Smoker   . Smokeless tobacco: Never Used  . Alcohol Use: No    No family history on file.  Allergies  Allergen Reactions  . Cefuroxime Axetil Hives  . Sulfa Antibiotics Hives    Medication list has been reviewed and updated.  Current Outpatient Prescriptions on File Prior to Visit  Medication Sig Dispense Refill  . acetaminophen (TYLENOL) 500 MG tablet Take 500 mg by mouth 2 (two) times daily as needed.      Marland Kitchen allopurinol (ZYLOPRIM) 100 MG tablet Take 1 tablet (100 mg total) by mouth 2 (two) times daily.  180 tablet   3  . augmented betamethasone dipropionate (DIPROLENE-AF) 0.05 % cream Apply topically 2 (two) times daily.  50 g  0  . calcium carbonate 200 MG capsule Take 250 mg by mouth daily.        . diclofenac (FLECTOR) 1.3 % PTCH Once a day as needed  30 patch  0  . diclofenac (VOLTAREN) 75 MG EC tablet Take 1 tablet (75 mg total) by mouth daily as needed.  30 tablet  0  . dicyclomine (BENTYL) 10 MG capsule Take 2 capsules (20 mg total) by mouth 2 (two) times daily.  540 capsule  3  . Elastic Bandages & Supports (AIRCAST SPORT ANKLE BRACE/LEFT) MISC Custom aircast for left foot.  1 each  0  . esomeprazole (NEXIUM) 20 MG capsule Take 20 mg by mouth as needed.        . fluticasone (FLONASE) 50 MCG/ACT nasal spray Place 2 sprays into the nose daily.  48 g  3  . ibuprofen (ADVIL,MOTRIN) 100 MG tablet Take 100 mg by mouth every 6 (six) hours as needed.        Marland Kitchen levothyroxine (SYNTHROID, LEVOTHROID) 137 MCG tablet Take 1 tablet (137 mcg total) by mouth daily. NEEDS OFFICE VISIT/LABS FOR  MORE  90 tablet  3  . metFORMIN (GLUCOPHAGE-XR) 500 MG 24 hr tablet Take one half tablet daily  45 tablet  3  . naproxen sodium (ANAPROX) 220 MG tablet Take 220 mg by mouth 2 (two) times daily with a meal.        . norgestimate-ethinyl estradiol (SPRINTEC 28) 0.25-35 MG-MCG tablet Take 1 tablet by mouth daily.  3 Package  4  . pioglitazone (ACTOS) 30 MG tablet Take 1 tablet (30 mg total) by mouth daily.  90 tablet  3  . sitaGLIPtin (JANUVIA) 100 MG tablet Take 1 tablet (100 mg total) by mouth daily.  90 tablet  3  . traMADol (ULTRAM) 50 MG tablet Take 1 tablet (50 mg total) by mouth every 6 (six) hours as needed.  30 tablet  5    Review of Systems:  As per HPI, otherwise negative.    Physical Examination: Filed Vitals:   11/05/11 1335  BP: 115/75  Pulse: 71  Temp: 98 F (36.7 C)  Resp: 18   Filed Vitals:   11/05/11 1335  Height: 5\' 4"  (1.626 m)  Weight: 272 lb 3.2 oz (123.469 kg)   Body mass index is 46.72  kg/(m^2). Ideal Body Weight: Weight in (lb) to have BMI = 25: 145.3   GEN: WDWN, NAD, Non-toxic, A & O x 3.  Obese, not septic or icteric. No rash HEENT: Atraumatic, Normocephalic. Neck supple. No masses, No LAD. Ears and Nose: No external deformity.  TM negative CV: RRR, No M/G/R. No JVD. No thrill. No extra heart sounds. PULM: CTA B, no wheezes, crackles, rhonchi. No retractions. No resp. distress. No accessory muscle use. ABD: S, NT, ND, +BS. No rebound. No HSM. Obese  EXTR: No c/c/e NEURO Normal gait.  PSYCH: Normally interactive. Conversant. Not depressed or anxious appearing.  Calm demeanor.    Assessment and Plan: Gastroenteritis Lumbar strain Imodium Continue current pain meds.  Carmelina Dane, MD    Results for orders placed in visit on 11/05/11  POCT URINALYSIS DIPSTICK      Component Value Range   Color, UA yellow     Clarity, UA clear     Glucose, UA neg     Bilirubin, UA neg     Ketones, UA neg     Spec Grav, UA <=1.005     Blood, UA trace-intact     pH, UA 6.5     Protein, UA neg     Urobilinogen, UA 0.2     Nitrite, UA neg     Leukocytes, UA Trace    POCT UA - MICROSCOPIC ONLY      Component Value Range   WBC, Ur, HPF, POC 0-1     RBC, urine, microscopic 0-1     Bacteria, U Microscopic trace     Mucus, UA neg     Epithelial cells, urine per micros 5-10     Crystals, Ur, HPF, POC neg     Casts, Ur, LPF, POC waxy casts     Yeast, UA neg     I have reviewed and agree with documentation. Robert P. Merla Riches, M.D.

## 2011-11-11 DIAGNOSIS — Q909 Down syndrome, unspecified: Secondary | ICD-10-CM | POA: Insufficient documentation

## 2011-11-19 ENCOUNTER — Encounter (INDEPENDENT_AMBULATORY_CARE_PROVIDER_SITE_OTHER): Payer: Medicare Other | Admitting: General Surgery

## 2011-12-06 ENCOUNTER — Ambulatory Visit (INDEPENDENT_AMBULATORY_CARE_PROVIDER_SITE_OTHER): Payer: Medicare Other | Admitting: General Surgery

## 2011-12-06 ENCOUNTER — Encounter (INDEPENDENT_AMBULATORY_CARE_PROVIDER_SITE_OTHER): Payer: Self-pay | Admitting: General Surgery

## 2011-12-06 VITALS — BP 148/66 | HR 100 | Temp 97.0°F | Resp 28 | Ht 64.0 in | Wt 270.0 lb

## 2011-12-06 DIAGNOSIS — R1013 Epigastric pain: Secondary | ICD-10-CM

## 2011-12-06 NOTE — Progress Notes (Signed)
Subjective:     Patient ID: Wanda Oneill, female   DOB: 1980/10/03, 31 y.o.   MRN: [id number]  HPI  59 yof I know well from a laparoscopic repair of a small ventral hernia that recurred after primary repair. This was a difficult postoperative course for her.  I saw her last in December after she had an episode of abdominal pain and a possible recurrent hernia. I did not feel a hernia when I saw her. I obtained her notes and the ct from [facility] and I did not see a hernia on her ct nor was one reported. I then sent her for another ct as it sounded like things had changed further. I  reviewed her CT scan as well as looking at it with a couple my other partners. When I look at her CT scan in relation to the operation that I did previously I did not think this was a recurrent hernia. I think that this area as it is read as a 1.7 cm hernia is just her hernia sac that is sitting above the mesh that previously placed. This goes along also with the fact that I do not feel a discrete defect on her exam. She returns today and does not really notice a definitive bulge but notes some epigastric discomfort.  She states she has abdominal pain when doing heavy lifting and this prevents her from doing that.  That is better now that she does not work at [employer].  She also states she presses her stomach in when having a bowel movement   Review of Systems     Objective:   Physical Exam Well healed incision, loss of tone throughout abdomen, no discrete hernia defect that i can identify    Assessment:     Abdominal pain, ? hernia    Plan:      I still do not definitively think she has a hernia.  I don't think her two complaints as listed above would be from that anyways.  I think the pain and heavy lifting is associated with her obesity and lack of abdominal musculature more than anything.  I think the bowel movement issue is also associated with this.  I think the only way to really be for sure would be to do  laparoscopy.  I think this is not without risk in her.  She had a very difficult course previously and I am hesitant to do anything unless we feel there is something to fix.  I think would be better to follow her for now and see if she has any further symptoms. They will call me. Her mom was present for this appt today. She also is interested in weight loss surgery and I will discuss with one of my partners and get back in touch with them as I think that might be helpful.

## 2011-12-27 ENCOUNTER — Telehealth (INDEPENDENT_AMBULATORY_CARE_PROVIDER_SITE_OTHER): Payer: Self-pay | Admitting: General Surgery

## 2011-12-27 NOTE — Telephone Encounter (Signed)
Spoke with pt's mother and explained that Dr. Johna Sheriff has agreed to take on her daughter as a pt for the weight loss program and that as soon as they get her an appt on his schedule, we would call them to let them know.

## 2012-01-13 ENCOUNTER — Telehealth: Payer: Self-pay | Admitting: Radiology

## 2012-01-13 DIAGNOSIS — E039 Hypothyroidism, unspecified: Secondary | ICD-10-CM

## 2012-01-13 MED ORDER — GLUCOSE BLOOD VI STRP: ORAL_STRIP | Status: DC

## 2012-01-13 MED ORDER — LEVOTHYROXINE SODIUM 137 MCG PO TABS: 137.0000 ug | ORAL_TABLET | Freq: Every day | ORAL | Status: DC

## 2012-01-13 NOTE — Telephone Encounter (Signed)
Okay to refill medications for one year. When I see her back for her diabetes check I will check a TSH and T4 at that time

## 2012-01-13 NOTE — Telephone Encounter (Signed)
Thanks, have done this.

## 2012-01-13 NOTE — Telephone Encounter (Signed)
I have gotten a refill request for test strips and Levothryoxine 137 I have sent in one month supply. Will you review her chart and see when she is due for labs with thyroid, should it be now, or okay to wait full year?

## 2012-01-22 DIAGNOSIS — K219 Gastro-esophageal reflux disease without esophagitis: Secondary | ICD-10-CM | POA: Insufficient documentation

## 2012-01-28 ENCOUNTER — Telehealth: Payer: Self-pay

## 2012-01-28 NOTE — Telephone Encounter (Signed)
Patient needs refill of synthroid, she is completely out.

## 2012-01-28 NOTE — Telephone Encounter (Signed)
Patient was given #90 with 3RF in September, she should still have refills left, is this not correct?

## 2012-01-28 NOTE — Telephone Encounter (Signed)
Refills already at pharmacy.  Pharmacy called and they refilled.

## 2012-02-05 DIAGNOSIS — Z0271 Encounter for disability determination: Secondary | ICD-10-CM

## 2012-08-10 DIAGNOSIS — M109 Gout, unspecified: Secondary | ICD-10-CM | POA: Insufficient documentation

## 2012-10-15 ENCOUNTER — Ambulatory Visit (INDEPENDENT_AMBULATORY_CARE_PROVIDER_SITE_OTHER): Payer: Medicare Other | Admitting: Sports Medicine

## 2012-10-15 ENCOUNTER — Encounter: Payer: Self-pay | Admitting: Sports Medicine

## 2012-10-15 VITALS — BP 122/80 | HR 59 | Ht 64.0 in | Wt 270.0 lb

## 2012-10-15 DIAGNOSIS — IMO0002 Reserved for concepts with insufficient information to code with codable children: Secondary | ICD-10-CM

## 2012-10-15 DIAGNOSIS — M76822 Posterior tibial tendinitis, left leg: Secondary | ICD-10-CM

## 2012-10-15 DIAGNOSIS — R269 Unspecified abnormalities of gait and mobility: Secondary | ICD-10-CM

## 2012-10-15 DIAGNOSIS — E1165 Type 2 diabetes mellitus with hyperglycemia: Secondary | ICD-10-CM

## 2012-10-15 DIAGNOSIS — M76829 Posterior tibial tendinitis, unspecified leg: Secondary | ICD-10-CM

## 2012-10-15 DIAGNOSIS — IMO0001 Reserved for inherently not codable concepts without codable children: Secondary | ICD-10-CM

## 2012-10-15 NOTE — Assessment & Plan Note (Signed)
I think this is chronic based on the pronation and the loss of her longitudinal arch. I suspect the injury to her medial ankle when she fell partially tore some of the ligament and possibly the ankle capsule so that there is more pronation of the left side than the right.  I strongly encouraged her to get back into using custom orthotics  I would like her to use an ankle support  I recommended heel raises and balance exercises for the left foot

## 2012-10-15 NOTE — Patient Instructions (Addendum)
Practice balancing on 1 foot and do heel raises on a step set of 10   Start wearing orthotics with athletic shoes more   Try x - compression brace  Please get x-ray at [facility]  Please follow up as needed  Thank you for seeing [place] today!

## 2012-10-15 NOTE — Assessment & Plan Note (Signed)
Her uncontrolled diabetes make sure greater risk of developing midfoot arthritis and possibly a Charcot joint  I want to repeat her foot x-rays today

## 2012-10-15 NOTE — Assessment & Plan Note (Signed)
This requires a custom orthotic even to get a partial correction of her pronation  She needs to purchase new tennis shoes so that she can continue wearing the orthotic

## 2012-10-15 NOTE — Progress Notes (Signed)
  Subjective:    Patient ID: Wanda Oneill, female    DOB: [birth year], 32 y.o.   MRN: [id number]  HPI  Pt presents to clinic for f/u of left foot pain.  She has a history of a fall think was on a wet floor while working at [employer] in this led to increased pain in the left medial ankle and arch. Initial x-rays were negative but her pain persisted she was sent here for multiple evaluations and treatments.  The pain improved somewhat the October and November of 2012 him in improved a great deal April of 2013.  However, since that time the pain is never completely resolved. When she does not use her orthotics she does get more pain. Over the course of the year her ankle support does not provide as much help as well.  Injured at work 06/2010. Office visits at [facility] 06/22/10- orthotics made, place in cam walker 07/06/10- Wean out of cam walker 08/21/10- ASO and orthotics adjustment, PT started 09/18/10- placed back in cam walker, started NTG, cont PT 10/16/10- MRI ordered, cont PT 10/23/10 - Prednisone, airsport brace, and crutches 11/06/10 - Cont airsport and PT, stop crutches 12/18/10 - Cont airsport and PT 05/28/11 - Improved- almost pain free - continue orthotics, airsport as needed and home rehab exercises  Pt has continued to have left foot pain in arch.  She has not been wearing any type of support - brace or orthotics. Wears crocs most of the time. When she goes to school and does more walking she has increased pain.   Review of Systems     Objective:   Physical Exam Obese female in no acute distress Physical appearance of Down's syndrome She is very conversant and has a good vocabulary and excellent recall of her history  Sinus tarsi swelling on lt Mild subtalar subluxation on lt Good great toe motion bilat Short 1st MT and MC segments bilat bilat ankle laxity with positive drawer on lt, not on rt bilat navicular drop with severe pronation and genu valgus Rear foot not as pronated    Walking gait shows significant pronation that is worse on the left along with dynamic genu valgus      Assessment & Plan:

## 2012-10-19 ENCOUNTER — Ambulatory Visit (INDEPENDENT_AMBULATORY_CARE_PROVIDER_SITE_OTHER): Payer: Medicare Other | Admitting: Emergency Medicine

## 2012-10-19 VITALS — BP 122/74 | HR 76 | Temp 98.4°F | Resp 16 | Ht 64.0 in | Wt 255.0 lb

## 2012-10-19 DIAGNOSIS — M67919 Unspecified disorder of synovium and tendon, unspecified shoulder: Secondary | ICD-10-CM

## 2012-10-19 DIAGNOSIS — M7552 Bursitis of left shoulder: Secondary | ICD-10-CM

## 2012-10-19 DIAGNOSIS — M25512 Pain in left shoulder: Secondary | ICD-10-CM

## 2012-10-19 DIAGNOSIS — M25519 Pain in unspecified shoulder: Secondary | ICD-10-CM

## 2012-10-19 MED ORDER — NAPROXEN SODIUM 550 MG PO TABS: 550.0000 mg | ORAL_TABLET | Freq: Two times a day (BID) | ORAL | Status: DC

## 2012-10-19 MED ORDER — METHYLPREDNISOLONE ACETATE 80 MG/ML IJ SUSP: 80.0000 mg | Freq: Once | INTRAMUSCULAR | Status: DC

## 2012-10-19 NOTE — Patient Instructions (Addendum)
Bursitis Bursitis is a swelling and soreness (inflammation) of a fluid-filled sac (bursa) that overlies and protects a joint. It can be caused by injury, overuse of the joint, arthritis or infection. The joints most likely to be affected are the elbows, shoulders, hips and knees. HOME CARE INSTRUCTIONS   Apply ice to the affected area for 15-20 minutes each hour while awake for 2 days. Put the ice in a plastic bag and place a towel between the bag of ice and your skin.  Rest the injured joint as much as possible, but continue to put the joint through a full range of motion, 4 times per day. (The shoulder joint especially becomes rapidly "frozen" if not used.) When the pain lessens, begin normal slow movements and usual activities.  Only take over-the-counter or prescription medicines for pain, discomfort or fever as directed by your caregiver.  Your caregiver may recommend draining the bursa and injecting medicine into the bursa. This may help the healing process.  Follow all instructions for follow-up with your caregiver. This includes any orthopedic referrals, physical therapy and rehabilitation. Any delay in obtaining necessary care could result in a delay or failure of the bursitis to heal and chronic pain. SEEK IMMEDIATE MEDICAL CARE IF:   Your pain increases even during treatment.  You develop an oral temperature above 102 F (38.9 C) and have heat and inflammation over the involved bursa. MAKE SURE YOU:   Understand these instructions.  Will watch your condition.  Will get help right away if you are not doing well or get worse. Document Released: 01/19/2000 Document Revised: 04/15/2011 Document Reviewed: 12/23/2008 Exit[employer] Patient Information 2014 [employer].  

## 2012-10-19 NOTE — Progress Notes (Signed)
[facility] [address] [place] [address] [id number]  Date:  10/19/2012   Name:  Wanda Oneill   DOB:  09-24-1980   MRN:  [id number]  PCP:  Dois Davenport., MD    Chief Complaint: Arm Pain   History of Present Illness:  Wanda Oneill is a 32 y.o. very pleasant female patient who presents with the following:  History of pain in left shoulder.  No history of overuse or injury.  Pain started Thursday.  Had similar in past in opposite shoulder.  No improvement with over the counter medications or other home remedies. Denies other complaint or health concern today.   Patient Active Problem List   Diagnosis Date Noted  . Down syndrome 11/11/2011  . Hernia of abdominal wall 10/21/2011  . Psoriasis 10/21/2011  . Type 2 diabetes mellitus, uncontrolled 04/12/2011  . Hypothyroid 04/12/2011  . Gait abnormality 06/22/2010  . Tibial tendinitis, posterior 06/22/2010    Past Medical History  Diagnosis Date  . Diabetes mellitus     type II  . Thyroid disease     hypothyroidism  . Down syndrome   . Hidradenitis   . Tendonitis     chronic in left foot  . Cancer     thyroid    Past Surgical History  Procedure Laterality Date  . Cholecystectomy    . Axillary hidradenitis excision    . Hernia repair      lvh with mesh  . Tonsilectomy, adenoidectomy, bilateral myringotomy and tubes      History  Substance Use Topics  . Smoking status: Never Smoker   . Smokeless tobacco: Never Used  . Alcohol Use: No    History reviewed. No pertinent family history.  Allergies  Allergen Reactions  . Cefuroxime Axetil Hives  . Sulfa Antibiotics Hives    Medication list has been reviewed and updated.  Current Outpatient Prescriptions on File Prior to Visit  Medication Sig Dispense Refill  . acetaminophen (TYLENOL) 500 MG tablet Take 500 mg by mouth 2 (two) times daily as needed.      Marland Kitchen augmented betamethasone dipropionate (DIPROLENE-AF) 0.05 % cream Apply  topically 2 (two) times daily.  50 g  0  . baclofen (LIORESAL) 10 MG tablet Take 10 mg by mouth daily as needed.      . clobetasol cream (TEMOVATE) 0.05 % daily as needed.      . dicyclomine (BENTYL) 10 MG capsule Take 20 mg by mouth 2 (two) times daily as needed.      Marland Kitchen ibuprofen (ADVIL,MOTRIN) 100 MG tablet Take 100 mg by mouth every 6 (six) hours as needed.        . INVOKANA 300 MG TABS Take 300 mg by mouth daily.      Marland Kitchen levothyroxine (SYNTHROID, LEVOTHROID) 137 MCG tablet Take 1 tablet (137 mcg total) by mouth daily.  30 tablet  11  . naproxen sodium (ANAPROX) 220 MG tablet Take 220 mg by mouth 2 (two) times daily with a meal.        . omeprazole (PRILOSEC) 20 MG capsule Take 20 mg by mouth daily.      . sitaGLIPtin (JANUVIA) 100 MG tablet Take 1 tablet (100 mg total) by mouth daily.  90 tablet  3  . traMADol (ULTRAM) 50 MG tablet Take 1 tablet (50 mg total) by mouth every 6 (six) hours as needed.  30 tablet  5   No current facility-administered medications on file prior to  visit.    Review of Systems:  As per HPI, otherwise negative.    Physical Examination: Filed Vitals:   10/19/12 1524  BP: 122/74  Pulse: 76  Temp: 98.4 F (36.9 C)  Resp: 16   Filed Vitals:   10/19/12 1524  Height: 5\' 4"  (1.626 m)  Weight: 255 lb (115.667 kg)   Body mass index is 43.75 kg/(m^2). Ideal Body Weight: Weight in (lb) to have BMI = 25: 145.3   GEN: WDWN, NAD, Non-toxic, Alert & Oriented x 3 HEENT: Atraumatic, Normocephalic.  Ears and Nose: No external deformity. EXTR: No clubbing/cyanosis/edema NEURO: Normal gait.  PSYCH: Normally interactive. Conversant. Not depressed or anxious appearing.  Calm demeanor.  LEFT shoulder:  Tender over lateral shoulder.  Limited ABDuction.  NATI   Assessment and Plan: Shoulder bursitis Depo 80 and marcaine 2 ml injection   Signed,  Phillips Odor, MD

## 2012-10-26 ENCOUNTER — Ambulatory Visit: Payer: Medicare Other | Admitting: Physical Therapy

## 2012-11-03 ENCOUNTER — Ambulatory Visit
Admission: RE | Admit: 2012-11-03 | Discharge: 2012-11-03 | Disposition: A | Discharge status: Home / Self Care | Payer: Medicaid Other | Source: Ambulatory Visit | Attending: Sports Medicine | Admitting: Sports Medicine

## 2012-11-03 ENCOUNTER — Other Ambulatory Visit: Payer: Self-pay | Admitting: *Deleted

## 2012-11-03 DIAGNOSIS — M79672 Pain in left foot: Secondary | ICD-10-CM

## 2012-11-11 ENCOUNTER — Encounter: Payer: Self-pay | Admitting: Sports Medicine

## 2012-11-14 ENCOUNTER — Other Ambulatory Visit: Payer: Self-pay | Admitting: Emergency Medicine

## 2012-12-08 ENCOUNTER — Encounter: Payer: Self-pay | Admitting: *Deleted

## 2012-12-21 ENCOUNTER — Telehealth (INDEPENDENT_AMBULATORY_CARE_PROVIDER_SITE_OTHER): Payer: Self-pay | Admitting: General Surgery

## 2012-12-21 ENCOUNTER — Ambulatory Visit (INDEPENDENT_AMBULATORY_CARE_PROVIDER_SITE_OTHER): Payer: Medicare Other | Admitting: Family Medicine

## 2012-12-21 ENCOUNTER — Ambulatory Visit (INDEPENDENT_AMBULATORY_CARE_PROVIDER_SITE_OTHER): Payer: Medicare Other | Admitting: Surgery

## 2012-12-21 ENCOUNTER — Encounter (INDEPENDENT_AMBULATORY_CARE_PROVIDER_SITE_OTHER): Payer: Self-pay | Admitting: Surgery

## 2012-12-21 VITALS — BP 90/50 | HR 99 | Temp 99.9°F | Resp 16 | Ht 64.5 in | Wt 249.0 lb

## 2012-12-21 VITALS — BP 98/60 | HR 84 | Temp 98.6°F | Resp 15

## 2012-12-21 DIAGNOSIS — R809 Proteinuria, unspecified: Secondary | ICD-10-CM

## 2012-12-21 DIAGNOSIS — E119 Type 2 diabetes mellitus without complications: Secondary | ICD-10-CM

## 2012-12-21 DIAGNOSIS — R109 Unspecified abdominal pain: Secondary | ICD-10-CM

## 2012-12-21 DIAGNOSIS — R1110 Vomiting, unspecified: Secondary | ICD-10-CM

## 2012-12-21 DIAGNOSIS — K439 Ventral hernia without obstruction or gangrene: Secondary | ICD-10-CM

## 2012-12-21 LAB — POCT CBC
Granulocyte percent: 92.1 %G — AB (ref 37–80)
HCT, POC: 46.7 % (ref 37.7–47.9)
Hemoglobin: 14.3 g/dL (ref 12.2–16.2)
Lymph, poc: 0.6 (ref 0.6–3.4)
MCH, POC: 28.4 pg (ref 27–31.2)
MCV: 92.6 fL (ref 80–97)
POC Granulocyte: 10 — AB (ref 2–6.9)
Platelet Count, POC: 250 10*3/uL (ref 142–424)
RBC: 5.04 M/uL (ref 4.04–5.48)
WBC: 10.9 10*3/uL — AB (ref 4.6–10.2)

## 2012-12-21 LAB — POCT URINALYSIS DIPSTICK
Bilirubin, UA: 5
Glucose, UA: 500
Ketones, UA: 15
Leukocytes, UA: NEGATIVE
Nitrite, UA: NEGATIVE
Protein, UA: 30
pH, UA: 5.5

## 2012-12-21 LAB — POCT URINE PREGNANCY: Preg Test, Ur: NEGATIVE

## 2012-12-21 LAB — GLUCOSE, POCT (MANUAL RESULT ENTRY): POC Glucose: 175 mg/dl — AB (ref 70–99)

## 2012-12-21 MED ORDER — ONDANSETRON 4 MG PO TBDP: 4.0000 mg | ORAL_TABLET | Freq: Once | ORAL | Status: DC

## 2012-12-21 NOTE — Progress Notes (Addendum)
Pt seen and examined along with Ms. Debbra Riding.  Agree with her assessment and plan.  I spoke with Dr. Dwain Sarna who is on call at the hospital and cannot see her today.  She will see Dr. Corliss Skains at the urgent work in clinic at [facility] this afternoon.   Latesa's parents know that they can call me at any time She has not taken her DM medications because she has not been eating but will start back on them as soon as she is able.   Also add on CMP  Spoke with Roshini's mom Liza around 7pm.  She reports that Ryka went back to her apt around 5 and seemed to be doing ok.  I noted that her BP was lower than usual at her [facility] visit.  Warden Fillers had also noted this, but stated that her BP at [facility] was about 120/90, which is at her baseline. Although this BP was not recorded (it was a recheck) Liza did take notice of it and felt reassured.

## 2012-12-21 NOTE — Telephone Encounter (Signed)
Dr Dallas Schimke called re: patient. States she is currently having a fever, vomiting and abdominal pain. States the mom doesn't want patient to have another CT. She is worried about her hernia. Reading Dr Doreen Salvage note from 12/05/12 he did not think she had a hernia. Dr Dallas Schimke wanted to send patient over for evaluation with one of our MD's. I advised I would page Dr Dwain Sarna to speak with her since he is familiar with patient. Dr Dwain Sarna paged to call Dr Dallas Schimke at [phone].

## 2012-12-21 NOTE — Progress Notes (Signed)
Subjective:    Patient ID: Wanda Oneill, female    DOB: 03-24-1980, 32 y.o.   MRN: [id number]  HPI   Wanda Oneill is a very pleasant 32 yr old female here with abdominal pain and vomiting.  She is accompanied today by her mother.  She has had 5 abdominal hernias in the past.  A new one was discovered within the last several years.  CT last yr indicated hernia contained omental fat on and Dr. Dwain Sarna opted not to operate at that time.  She has an appt scheduled with Dr. Dwain Sarna 01/25/13.  She reports abdominal pain that began last night.  7 episodes of vomiting through the night, some diarrhea as well.  Last emesis at 7 am.  No fever, but 99.85F at triage.  Reports pain 9/10 in upper abdomen.  No known sick contacts.  She has had a few sips of water today but nothing else.  She did urinate this morning.  No urinary symptoms.  LMP about 2 wks ago, some spotting but just restarted OCP.  Hx stomach issues - takes bentyl and omeprazole.  She has diabetes - has not taken meds today.     Review of Systems  Constitutional: Negative for fever and chills.  Respiratory: Negative for cough, shortness of breath and wheezing.   Cardiovascular: Negative.   Gastrointestinal: Positive for nausea, vomiting, abdominal pain and diarrhea.  Genitourinary: Negative for dysuria, frequency and hematuria.  Musculoskeletal: Positive for back pain.  Skin: Negative.   Neurological: Negative.        Objective:   Physical Exam  Vitals reviewed. Constitutional: She is oriented to person, place, and time. She appears well-developed and well-nourished. She appears ill. No distress.  HENT:  Head: Normocephalic and atraumatic.  Mouth/Throat: Mucous membranes are dry.  Eyes: Conjunctivae are normal. No scleral icterus.  Cardiovascular: Normal rate, regular rhythm and normal heart sounds.   Pulmonary/Chest: Effort normal and breath sounds normal.  Abdominal: Soft. Bowel sounds are normal. There is tenderness in the right  upper quadrant, epigastric area and left upper quadrant. There is no rigidity, no rebound, no guarding and no tenderness at McBurney's point.  Bowel sounds normoactive; tenderness in upper abd; mulitple surgical scars along abd; tenderness over known hernia, but more so in upper abd; no rigidity  Neurological: She is alert and oriented to person, place, and time.  Skin: Skin is warm and dry.  Psychiatric: She has a normal mood and affect. Her behavior is normal.    Results for orders placed in visit on 12/21/12  POCT CBC      Result Value Range   WBC 10.9 (*) 4.6 - 10.2 K/uL   Lymph, poc 0.6  0.6 - 3.4   POC LYMPH PERCENT 5.3 (*) 10 - 50 %L   MID (cbc) 0.3  0 - 0.9   POC MID % 2.6  0 - 12 %M   POC Granulocyte 10.0 (*) 2 - 6.9   Granulocyte percent 92.1 (*) 37 - 80 %G   RBC 5.04  4.04 - 5.48 M/uL   Hemoglobin 14.3  12.2 - 16.2 g/dL   HCT, POC 40.9  81.1 - 47.9 %   MCV 92.6  80 - 97 fL   MCH, POC 28.4  27 - 31.2 pg   MCHC 30.6 (*) 31.8 - 35.4 g/dL   RDW, POC 91.4     Platelet Count, POC 250  142 - 424 K/uL   MPV 8.2  0 - 99.8 fL  GLUCOSE, POCT (MANUAL RESULT ENTRY)      Result Value Range   POC Glucose 175 (*) 70 - 99 mg/dl  POCT URINALYSIS DIPSTICK      Result Value Range   Color, UA amber     Clarity, UA cloudy     Glucose, UA 500     Bilirubin, UA 5     Ketones, UA 15     Spec Grav, UA 1.020     Blood, UA large     pH, UA 5.5     Protein, UA 30     Urobilinogen, UA 0.2     Nitrite, UA neg     Leukocytes, UA Negative    POCT URINE PREGNANCY      Result Value Range   Preg Test, Ur Negative         Assessment & Plan:  Abdominal pain - Plan: POCT CBC, POCT urinalysis dipstick, POCT urine pregnancy  Vomiting - Plan: POCT CBC, ondansetron (ZOFRAN-ODT) disintegrating tablet 4 mg  Ventral hernia  DM (diabetes mellitus) - Plan: POCT glucose (manual entry), CANCELED: POCT glycosylated hemoglobin (Hb A1C)   Wanda Oneill is a very pleasant 32 yr old female here with  abdominal pain, vomiting, and diarrhea in the setting of a known ventral hernia.  Vomiting and diarrhea have slowed at this point, but pt does have a low grade fever and white count of 10.9.  Certainly this could be a viral gastroenteritis, but with her known hernia would like to rule out incarceration.  We have spoken with [facility] who will work the pt in for an appt at 2:45pm today.  We may need to proceed with CT scan later today.    Case discussed with Dr. Margreta Journey MHS, PA-C [facility] 11/17/201412:44 PM

## 2012-12-21 NOTE — Patient Instructions (Signed)
We have worked you in to [facility] at 2:45pm today.

## 2012-12-21 NOTE — Progress Notes (Signed)
Urgent office on 12/21/12 This is a patient of Dr. Emelia Loron who is status post multiple ventral hernia repairs with mesh. Her last surgery was about 5 years ago and was a laparoscopic ventral hernia repair with mesh. The patient was last seen one year ago with some upper abdominal pain. Her last CT scan was in early 2013 and did not show an obvious recurrence. Over the last couple of days the patient has had some epigastric abdominal pain, nausea, vomiting, and diarrhea. The nausea and diarrhea have improved today. She still has a poor appetite. She denies any significant abdominal distention. Her primary care physician referred her for evaluation make sure she did not have a hernia.  Filed Vitals:   12/21/12 1517  BP: 98/60  Pulse: 84  Temp: 98.6 F (37 C)  Resp: 15     Her abdomen is obese but soft. She has multiple transverse incisions as well as laparoscopic incisions. When the patient is standing and with Valsalva maneuver there is no sign of recurrent hernia on physical examination. No significant abdominal tenderness. No palpable masses. Her abdomen does not seem particularly distended.  It is unlikely that her nausea, vomiting, and diarrhea are due to any type of recurrent hernia. She does not show any signs of bowel obstruction.  I cannot palpate any ventral hernia on physical examination. However, it secondary to her very complex past surgical history and her continued complaints of pain, I would recommend a repeat CT scan before her next followup appointment with Dr. Dwain Sarna. I spoke with his nurse and she has ordered the CT scan. The patient has an appointment with him in December.  Wilmon Arms. Corliss Skains, MD, [facility]  General/ Trauma Surgery  12/21/2012 3:49 PM

## 2012-12-22 LAB — COMPREHENSIVE METABOLIC PANEL
ALT: 23 U/L (ref 0–35)
AST: 23 U/L (ref 0–37)
Albumin: 3.5 g/dL (ref 3.5–5.2)
Alkaline Phosphatase: 43 U/L (ref 39–117)
BUN: 14 mg/dL (ref 6–23)
CO2: 25 mEq/L (ref 19–32)
Calcium: 8.3 mg/dL — ABNORMAL LOW (ref 8.4–10.5)
Chloride: 100 mEq/L (ref 96–112)
Potassium: 4.2 mEq/L (ref 3.5–5.3)
Sodium: 139 mEq/L (ref 135–145)
Total Bilirubin: 0.3 mg/dL (ref 0.3–1.2)

## 2012-12-23 ENCOUNTER — Encounter: Payer: Self-pay | Admitting: Family Medicine

## 2012-12-23 NOTE — Addendum Note (Signed)
Addended by: Abbe Amsterdam C on: 12/23/2012 07:49 PM   Modules accepted: Orders

## 2012-12-30 ENCOUNTER — Other Ambulatory Visit: Payer: Medicare Other

## 2013-01-04 ENCOUNTER — Ambulatory Visit (INDEPENDENT_AMBULATORY_CARE_PROVIDER_SITE_OTHER): Payer: Medicare Other | Admitting: Surgery

## 2013-01-06 ENCOUNTER — Ambulatory Visit
Admission: RE | Admit: 2013-01-06 | Discharge: 2013-01-06 | Disposition: A | Discharge status: Home / Self Care | Payer: Medicare Other | Source: Ambulatory Visit | Attending: General Surgery | Admitting: General Surgery

## 2013-01-06 DIAGNOSIS — R109 Unspecified abdominal pain: Secondary | ICD-10-CM

## 2013-01-06 MED ORDER — IOHEXOL 300 MG/ML  SOLN
125.0000 mL | Freq: Once | INTRAMUSCULAR | Status: AC | PRN
  Administered 2013-01-06: 125 mL via INTRAVENOUS

## 2013-01-25 ENCOUNTER — Encounter (INDEPENDENT_AMBULATORY_CARE_PROVIDER_SITE_OTHER): Payer: Self-pay | Admitting: General Surgery

## 2013-01-25 ENCOUNTER — Ambulatory Visit (INDEPENDENT_AMBULATORY_CARE_PROVIDER_SITE_OTHER): Payer: Medicare Other | Admitting: General Surgery

## 2013-01-25 ENCOUNTER — Encounter (INDEPENDENT_AMBULATORY_CARE_PROVIDER_SITE_OTHER): Payer: Self-pay

## 2013-01-25 VITALS — BP 124/80 | HR 72 | Temp 98.4°F | Resp 15 | Ht 64.0 in | Wt 254.0 lb

## 2013-01-25 DIAGNOSIS — R109 Unspecified abdominal pain: Secondary | ICD-10-CM

## 2013-01-25 DIAGNOSIS — Z09 Encounter for follow-up examination after completed treatment for conditions other than malignant neoplasm: Secondary | ICD-10-CM

## 2013-01-25 NOTE — Progress Notes (Signed)
Subjective:     Patient ID: Wanda Oneill, female   DOB: 05-06-1980, 32 y.o.   MRN: [id number]  HPI This is a 32 year old female who I know well. She has a surgical history of some open hernia repairs, laparoscopic cholecystectomy, as well as a laparoscopic incisional hernia repair with mesh that I did. She continues to have some abdominal pain. She has been having pain at the most superior incision that she has. She clearly points to that. She had one episode several weeks ago that she saw Dr. Patsy Lager for. I discussed with her at that time. She then saw one of my partners who ordered a CT scan although he did not feel anything concerning on her examination. This area which she points to which is really in her epigastrium hurts when she is having a bowel movement or when she is lifting something heavy. 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 She has one episode of emesis associated with the very significant episode. This also hurts more when she is eating greasy or fatty foods. She has not noticed a bulge in that area. She comes in today to review her CT scan and to discuss this. Both of her parents are also with her today.  Review of Systems EXAM:  CT ABDOMEN AND PELVIS WITH CONTRAST  TECHNIQUE:  Multidetector CT imaging of the abdomen and pelvis was performed  using the standard protocol following bolus administration of  intravenous contrast.  CONTRAST: OMNIPAQUE IOHEXOL 300 MG/ML SOLN  COMPARISON: CT abdomen pelvis of 04/24/2011  FINDINGS:  The lung bases clear. The liver is somewhat low in attenuation  suggesting fatty infiltration with probable small hemangioma  medially in the medial segment of the left lobe posteriorly.  Surgical clips are present from prior cholecystectomy. The pancreas  is normal in size in the pancreatic duct is not dilated. The adrenal  glands and spleen are unremarkable. The stomach is moderately  distended with fluid and food  debris with no abnormality noted. The  kidneys enhance with no calculus or mass and no hydronephrosis is  seen. The abdominal aorta is normal in caliber. No adenopathy is  seen.  The previously noted small collection of fat to the right of midline  several cms above the umbilicus now appears to have a calcified  periphery and probably represents an area of fat necrosis. Linear  stranding in the subcutaneous soft tissues adjacent to this probable  area fat necrosis is stable. The small left periumbilical hernia  containing fat is unchanged. No new anterior abdominal wall hernia  is seen. Surgical mesh is noted anteriorly from above the umbilicus  to the mid pelvic abdominal wall. The lumbar vertebrae are normal  alignment with degenerative change involving the facet joints of the  lower lumbar spine.  1. No new anterior abdominal wall hernia is seen.  2. Probable small focus of fat necrosis at the site of the  previously described small right ventral hernia above the umbilicus  which is now partially calcified.  3. No change in left periumbilical hernia containing only fat.  4. Probable fatty infiltration of the liver.     Objective:   Physical Exam  Vitals reviewed. Constitutional: She appears well-developed and well-nourished.  Abdominal: Normal appearance and bowel sounds are normal. She exhibits no distension. There is tenderness. No hernia. Hernia confirmed negative in the ventral area.         Assessment:  Abdominal pain     Plan:     I have reviewed her CT scan. I don't really think she has a new hernia. I think these are hernia sac from the prior repair and I dont think she has a new hernia. There is a chance she could have a small hernia but I do not think this is the source of her symptoms right now although either. Her symptoms as listed above sounds more GI in nature right now. I discussed this with [name] as well as her parents today. I think at first a GI  evaluation would be reasonable. She does take Bentyl and uses Prilosec. If this is negative then I will plan on seeing her back. She is s/p cholecystectomy.

## 2013-02-05 ENCOUNTER — Other Ambulatory Visit: Payer: Self-pay | Admitting: Emergency Medicine

## 2013-02-08 ENCOUNTER — Telehealth (INDEPENDENT_AMBULATORY_CARE_PROVIDER_SITE_OTHER): Payer: Self-pay

## 2013-02-08 DIAGNOSIS — R109 Unspecified abdominal pain: Secondary | ICD-10-CM

## 2013-02-08 NOTE — Telephone Encounter (Signed)
Placed there referral request into epic today for the pt to be seen by Dr Hilarie Fredrickson.

## 2013-02-24 ENCOUNTER — Encounter: Payer: Self-pay | Admitting: Internal Medicine

## 2013-03-04 ENCOUNTER — Encounter: Payer: Self-pay | Admitting: Internal Medicine

## 2013-03-05 ENCOUNTER — Encounter: Payer: Self-pay | Admitting: Internal Medicine

## 2013-03-05 ENCOUNTER — Ambulatory Visit (INDEPENDENT_AMBULATORY_CARE_PROVIDER_SITE_OTHER): Payer: Medicare Other | Admitting: Internal Medicine

## 2013-03-05 VITALS — BP 90/70 | HR 64 | Ht 64.0 in | Wt 254.0 lb

## 2013-03-05 DIAGNOSIS — K439 Ventral hernia without obstruction or gangrene: Secondary | ICD-10-CM

## 2013-03-05 DIAGNOSIS — R109 Unspecified abdominal pain: Secondary | ICD-10-CM

## 2013-03-05 DIAGNOSIS — R1013 Epigastric pain: Secondary | ICD-10-CM

## 2013-03-05 MED ORDER — OMEPRAZOLE 20 MG PO CPDR: 20.0000 mg | DELAYED_RELEASE_CAPSULE | Freq: Every day | ORAL | Status: DC

## 2013-03-05 MED ORDER — DICYCLOMINE HCL 10 MG PO CAPS: 20.0000 mg | ORAL_CAPSULE | Freq: Three times a day (TID) | ORAL | Status: DC

## 2013-03-05 NOTE — Progress Notes (Signed)
Patient ID: Wanda Oneill, female   DOB: 1980-03-27, 33 y.o.   MRN: [id number] HPI: Ludie Hudon is a 33 yo female with PMH of Down syndrome, hypothyroidism, type 2 diabetes, gallbladder disease status post cholecystectomy, and ventral hernia with multiple repairs who is seen in consultation at the request of Dr. Donne Hazel to evaluate abdominal pain. She is here today with her mother. She reports that she feels like she has another abdominal hernia which is causing her abdominal pain. She reports this pain is present every day it tends to be worse with eating. It is also worse with fried and fatty foods. At times it feels better after a bowel movement. She is having approximately 3-4 bowel movements daily which can be loose. No constipation. No blood in her stool or melena. She reports loose stools which occur postprandially and at times are urgent. This has been most noticeable after cholecystectomy.  Regarding her abdominal pain it doesn't radiate and is located in the epigastrium and central midabdomen. She uses Bentyl for this pain with relief. The pain can at times be worse with lifting heavy objects and lying on her stomach. She has a prescription for omeprazole but does not take this on a daily basis. She uses naproxen 3-4 days a week for chronic lower leg pain after an injury which occurred at work years ago.. She uses Bentyl several times daily.  Past Medical History  Diagnosis Date  . Diabetes mellitus     type II  . Thyroid disease     hypothyroidism  . Down syndrome   . Hidradenitis   . Tendonitis     chronic in left foot  . Cancer     thyroid    Past Surgical History  Procedure Laterality Date  . Cholecystectomy    . Axillary hidradenitis excision    . Hernia repair      lvh with mesh  . Tonsilectomy, adenoidectomy, bilateral myringotomy and tubes      Current Outpatient Prescriptions  Medication Sig Dispense Refill  . acetaminophen (TYLENOL) 500 MG tablet Take 500 mg by  mouth 2 (two) times daily as needed.      Marland Kitchen allopurinol (ZYLOPRIM) 300 MG tablet Take 300 mg by mouth daily.      Marland Kitchen augmented betamethasone dipropionate (DIPROLENE-AF) 0.05 % cream Apply topically 2 (two) times daily.  50 g  0  . baclofen (LIORESAL) 10 MG tablet Take 10 mg by mouth daily as needed.      . clobetasol cream (TEMOVATE) 0.05 % daily as needed.      . dicyclomine (BENTYL) 10 MG capsule Take 2 capsules (20 mg total) by mouth 4 (four) times daily -  before meals and at bedtime.  90 capsule  6  . ibuprofen (ADVIL,MOTRIN) 100 MG tablet Take 100 mg by mouth every 6 (six) hours as needed.        . INVOKANA 300 MG TABS Take 300 mg by mouth daily.      Marland Kitchen levothyroxine (SYNTHROID, LEVOTHROID) 137 MCG tablet Take 1 tablet (137 mcg total) by mouth daily before breakfast. PATIENT NEEDS OFFICE VISIT/LABS FOR ADDITIONAL REFILLS  30 tablet  0  . naproxen sodium (ANAPROX) 220 MG tablet Take 220 mg by mouth 2 (two) times daily with a meal.        . norgestimate-ethinyl estradiol (ORTHO-CYCLEN,SPRINTEC,PREVIFEM) 0.25-35 MG-MCG tablet Take 1 tablet by mouth daily.      Marland Kitchen omeprazole (PRILOSEC) 20 MG capsule Take 1 capsule (20 mg total)  by mouth daily.  30 capsule  6  . sitaGLIPtin (JANUVIA) 100 MG tablet Take 1 tablet (100 mg total) by mouth daily.  90 tablet  3  . traMADol (ULTRAM) 50 MG tablet Take 1 tablet (50 mg total) by mouth every 6 (six) hours as needed.  30 tablet  5   Current Facility-Administered Medications  Medication Dose Route Frequency Provider Last Rate Last Dose  . methylPREDNISolone acetate (DEPO-MEDROL) injection 80 mg  80 mg Intramuscular Once Ellison Carwin, MD      . ondansetron (ZOFRAN-ODT) disintegrating tablet 4 mg  4 mg Oral Once Theda Sers, PA-C        Allergies  Allergen Reactions  . Cefuroxime Axetil Hives  . Sulfa Antibiotics Hives    History reviewed. No pertinent family history.  History  Substance Use Topics  . Smoking status: Never Smoker   .  Smokeless tobacco: Never Used  . Alcohol Use: No    ROS: As per history of present illness, otherwise negative  BP 90/70  Pulse 64  Ht _0  (1.626 m)  Wt 254 lb (115.214 kg)  BMI 43.58 kg/m2 Constitutional: Well-developed and well-nourished, typical appearance for trisomy 21. No distress. HEENT: Oropharynx is clear and moist. No oropharyngeal exudate. Conjunctivae are normal.  No scleral icterus. Neck: Neck supple. Trachea midline. Cardiovascular: Normal rate, regular rhythm and intact distal pulses.  Pulmonary/chest: Effort normal and breath sounds normal. No wheezing, rales or rhonchi. Abdominal: Soft, obese, epigastric and midabdominal tenderness with deep palpation, no rebound or guarding, multiple well-healed abdominal scars no palpable hernias, nondistended. Bowel sounds active throughout. Extremities: no clubbing, cyanosis, or edema Neurological: Alert and oriented to person place and time. Skin: Skin is warm and dry. No rashes noted. Psychiatric: Normal mood and affect. Behavior is normal.  RELEVANT LABS AND IMAGING: CBC    Component Value Date/Time   WBC 10.9* 12/21/2012 1159   WBC 7.6 04/22/2011 1330   RBC 5.04 12/21/2012 1159   RBC 4.33 04/22/2011 1330   HGB 14.3 12/21/2012 1159   HGB 12.0 04/22/2011 1330   HCT 46.7 12/21/2012 1159   HCT 39.0 04/22/2011 1330   PLT 340 04/22/2011 1330   MCV 92.6 12/21/2012 1159   MCV 90.1 04/22/2011 1330   MCH 28.4 12/21/2012 1159   MCH 27.7 04/22/2011 1330   MCHC 30.6* 12/21/2012 1159   MCHC 30.8 04/22/2011 1330   RDW 15.7* 04/22/2011 1330   LYMPHSABS 2.3 04/22/2011 1330   MONOABS 0.6 04/22/2011 1330   EOSABS 0.1 04/22/2011 1330   BASOSABS 0.1 04/22/2011 1330    CMP     Component Value Date/Time   NA 139 12/21/2012 1851   K 4.2 12/21/2012 1851   CL 100 12/21/2012 1851   CO2 25 12/21/2012 1851   GLUCOSE 138* 12/21/2012 1851   BUN 14 12/21/2012 1851   CREATININE 0.73 12/21/2012 1851   CREATININE 0.63 06/20/2008 1036   CALCIUM  8.3* 12/21/2012 1851   PROT 7.1 12/21/2012 1851   ALBUMIN 3.5 12/21/2012 1851   AST 23 12/21/2012 1851   ALT 23 12/21/2012 1851   ALKPHOS 43 12/21/2012 1851   BILITOT 0.3 12/21/2012 1851   GFRNONAA >60 06/20/2008 1036   GFRAA  Value: >60        The eGFR has been calculated using the MDRD equation. This calculation has not been validated in all clinical situations. eGFR's persistently <60 mL/min signify possible Chronic Kidney Disease. 06/20/2008 1036   CLINICAL DATA:  Abdominal pain, evaluate for  recurrent umbilical hernia, history of 5 prior hernia repairs   EXAM: CT ABDOMEN AND PELVIS WITH CONTRAST -- DEC 2014   TECHNIQUE: Multidetector CT imaging of the abdomen and pelvis was performed using the standard protocol following bolus administration of intravenous contrast.   CONTRAST:  179m OMNIPAQUE IOHEXOL 300 MG/ML  SOLN   COMPARISON:  CT abdomen pelvis of 04/24/2011   FINDINGS: The lung bases clear. The liver is somewhat low in attenuation suggesting fatty infiltration with probable small hemangioma medially in the medial segment of the left lobe posteriorly. Surgical clips are present from prior cholecystectomy. The pancreas is normal in size in the pancreatic duct is not dilated. The adrenal glands and spleen are unremarkable. The stomach is moderately distended with fluid and food debris with no abnormality noted. The kidneys enhance with no calculus or mass and no hydronephrosis is seen. The abdominal aorta is normal in caliber. No adenopathy is seen.   The previously noted small collection of fat to the right of midline several cms above the umbilicus now appears to have a calcified periphery and probably represents an area of fat necrosis. Linear stranding in the subcutaneous soft tissues adjacent to this probable area fat necrosis is stable. The small left periumbilical hernia containing fat is unchanged. No new anterior abdominal wall hernia is seen. Surgical mesh  is noted anteriorly from above the umbilicus to the mid pelvic abdominal wall. The lumbar vertebrae are normal alignment with degenerative change involving the facet joints of the lower lumbar spine.   1. No new anterior abdominal wall hernia is seen. 2. Probable small focus of fat necrosis at the site of the previously described small right ventral hernia above the umbilicus which is now partially calcified. 3. No change in left periumbilical hernia containing only fat. 4. Probable fatty infiltration of the liver.   ASSESSMENT/PLAN: 33yo female with PMH of Down syndrome, hypothyroidism, type 2 diabetes, gallbladder disease status post cholecystectomy, and ventral hernia with multiple repairs who is seen in consultation at the request of Dr. WDonne Hazelto evaluate abdominal pain.  1.  Epigastric/mid-abd pain -- her pain could be dyspepsia or ulcer related. I will also like to rule out celiac disease. She has seen Dr. WDonne Hazelwho does not think her current pain is hernia related, and I agree. Irritable bowel is also a possibility. I have recommended that she take her omeprazole 20 mg daily on a more regular basis. She had been taking this at night more as needed and I recommended 30 minutes to one hour before breakfast each day.  I also have suggested she schedule her Bentyl 20 mg 3 times a day to try to prevent painful episodes. I also recommended low fat diet. [place] discussed the test today including the risks and benefits and they are agreeable to proceed. Endoscopy will help rule out active inflammation/ulcer disease in the setting of NSAIDs. Labs and imaging reviewed as above.

## 2013-03-05 NOTE — Patient Instructions (Signed)
We have sent the following medications to your pharmacy for you to pick up at your convenience: Omeprazole 20 mg DAILY; Bentyl; shcedule your times to take it.  You have been scheduled for an endoscopy with propofol. Please follow written instructions given to you at your visit today. If you use inhalers (even only as needed), please bring them with you on the day of your procedure. Your physician has requested that you go to www.[email or web address].com and enter the access code given to you at your visit today. This web site gives a general overview about your procedure. However, you should still follow specific instructions given to you by our office regarding your preparation for the procedure.                                                We are excited to introduce MyChart, a new best-in-class service that provides you online access to important information in your electronic medical record. We want to make it easier for you to view your health information - all in one secure location - when and where you need it. We expect MyChart will enhance the quality of care and service we provide.  When you register for MyChart, you can:    View your test results.    Request appointments and receive appointment reminders via email.    Request medication renewals.    View your medical history, allergies, medications and immunizations.    Communicate with your physician's office through a password-protected site.    Conveniently print information such as your medication lists.  To find out if MyChart is right for you, please talk to a member of our clinical staff today. We will gladly answer your questions about this free health and wellness tool.  If you are age 34 or older and want a member of your family to have access to your record, you must provide written consent by completing a proxy form available at our office. Please speak to our clinical staff about guidelines regarding accounts for patients  younger than age 96.  As you activate your MyChart account and need any technical assistance, please call the MyChart technical support line at [phone]) or email your question to [email or web address]. If you email your question(s), please include your name, a return phone number and the best time to reach you.  If you have non-urgent health-related questions, you can send a message to our office through [place] at [email or web address]. If you have a medical emergency, call 911.  Thank you for using MyChart as your new health and wellness resource!   MyChart licensed from [employer],  1999-2010. Patents Pending.

## 2013-03-09 ENCOUNTER — Encounter: Payer: Self-pay | Admitting: Internal Medicine

## 2013-04-03 ENCOUNTER — Ambulatory Visit (INDEPENDENT_AMBULATORY_CARE_PROVIDER_SITE_OTHER): Payer: Medicare Other | Admitting: Emergency Medicine

## 2013-04-03 VITALS — BP 102/70 | HR 77 | Temp 98.2°F | Resp 18 | Ht 62.5 in | Wt 249.0 lb

## 2013-04-03 DIAGNOSIS — IMO0002 Reserved for concepts with insufficient information to code with codable children: Secondary | ICD-10-CM

## 2013-04-03 MED ORDER — AMOXICILLIN-POT CLAVULANATE 875-125 MG PO TABS: 1.0000 | ORAL_TABLET | Freq: Two times a day (BID) | ORAL | Status: DC

## 2013-04-03 NOTE — Progress Notes (Signed)
[facility] [address] [place] [address] [phone]  Date:  04/03/2013   Name:  Wanda Oneill   DOB:  1980-06-11   MRN:  [id number]  PCP:  [name]., MD    Chief Complaint: Finger Infection   History of Present Illness:  Wanda Oneill is a 33 y.o. very pleasant female patient who presents with the following:  Has pain in left index finger.  History of MRSA.  No improvement with over the counter medications or other home remedies. Denies other complaint or health concern today.   Patient Active Problem List   Diagnosis Date Noted  . Down syndrome 11/11/2011  . Hernia of abdominal wall 10/21/2011  . Psoriasis 10/21/2011  . Type 2 diabetes mellitus, uncontrolled 04/12/2011  . Hypothyroid 04/12/2011  . Gait abnormality 06/22/2010  . Tibial tendinitis, posterior 06/22/2010    Past Medical History  Diagnosis Date  . Diabetes mellitus     type II  . Thyroid disease     hypothyroidism  . Down syndrome   . Hidradenitis   . Tendonitis     chronic in left foot  . Cancer     thyroid    Past Surgical History  Procedure Laterality Date  . Cholecystectomy    . Axillary hidradenitis excision    . Hernia repair      lvh with mesh  . Tonsilectomy, adenoidectomy, bilateral myringotomy and tubes      History  Substance Use Topics  . Smoking status: Never Smoker   . Smokeless tobacco: Never Used  . Alcohol Use: No    History reviewed. No pertinent family history.  Allergies  Allergen Reactions  . Cefuroxime Axetil Hives  . Sulfa Antibiotics Hives    Medication list has been reviewed and updated.  Current Outpatient Prescriptions on File Prior to Visit  Medication Sig Dispense Refill  . acetaminophen (TYLENOL) 500 MG tablet Take 500 mg by mouth 2 (two) times daily as needed.      Marland Kitchen allopurinol (ZYLOPRIM) 300 MG tablet Take 300 mg by mouth daily.      Marland Kitchen augmented betamethasone dipropionate (DIPROLENE-AF) 0.05 % cream Apply  topically 2 (two) times daily.  50 g  0  . clobetasol cream (TEMOVATE) 0.05 % daily as needed.      . dicyclomine (BENTYL) 10 MG capsule Take 2 capsules (20 mg total) by mouth 4 (four) times daily -  before meals and at bedtime.  90 capsule  6  . INVOKANA 300 MG TABS Take 300 mg by mouth daily.      Marland Kitchen levothyroxine (SYNTHROID, LEVOTHROID) 137 MCG tablet Take 1 tablet (137 mcg total) by mouth daily before breakfast. PATIENT NEEDS OFFICE VISIT/LABS FOR ADDITIONAL REFILLS  30 tablet  0  . naproxen sodium (ANAPROX) 220 MG tablet Take 220 mg by mouth 2 (two) times daily with a meal.        . norgestimate-ethinyl estradiol (ORTHO-CYCLEN,SPRINTEC,PREVIFEM) 0.25-35 MG-MCG tablet Take 1 tablet by mouth daily.      Marland Kitchen omeprazole (PRILOSEC) 20 MG capsule Take 1 capsule (20 mg total) by mouth daily.  30 capsule  6  . sitaGLIPtin (JANUVIA) 100 MG tablet Take 1 tablet (100 mg total) by mouth daily.  90 tablet  3  . traMADol (ULTRAM) 50 MG tablet Take 1 tablet (50 mg total) by mouth every 6 (six) hours as needed.  30 tablet  5  . baclofen (LIORESAL) 10 MG tablet Take 10 mg by  mouth daily as needed.      Marland Kitchen ibuprofen (ADVIL,MOTRIN) 100 MG tablet Take 100 mg by mouth every 6 (six) hours as needed.         Current Facility-Administered Medications on File Prior to Visit  Medication Dose Route Frequency Provider Last Rate Last Dose  . methylPREDNISolone acetate (DEPO-MEDROL) injection 80 mg  80 mg Intramuscular Once Ellison Carwin, MD      . ondansetron (ZOFRAN-ODT) disintegrating tablet 4 mg  4 mg Oral Once Theda Sers, PA-C        Review of Systems:  As per HPI, otherwise negative.    Physical Examination: Filed Vitals:   04/03/13 1615  BP: 102/70  Pulse: 77  Temp: 98.2 F (36.8 C)  Resp: 18   Filed Vitals:   04/03/13 1615  Height: 5' 2.5" (1.588 m)  Weight: 249 lb (112.946 kg)   Body mass index is 44.79 kg/(m^2). Ideal Body Weight: Weight in (lb) to have BMI = 25: 138.6   GEN: WDWN,  NAD, Non-toxic, Alert & Oriented x 3 HEENT: Atraumatic, Normocephalic.  Ears and Nose: No external deformity. EXTR: No clubbing/cyanosis/edema NEURO: Normal gait.  PSYCH: Normally interactive. Conversant. Not depressed or anxious appearing.  Calm demeanor.  LEFT hand:  Paronychia left index finger   Assessment and Plan: Paronychia augmentin  Signed,  Ellison Carwin, MD

## 2013-04-03 NOTE — Patient Instructions (Signed)
Paronychia Paronychia is an inflammatory reaction involving the folds of the skin surrounding the fingernail. This is commonly caused by an infection in the skin around a nail. The most common cause of paronychia is frequent wetting of the hands (as seen with bartenders, food servers, nurses or others who wet their hands). This makes the skin around the fingernail susceptible to infection by bacteria (germs) or fungus. Other predisposing factors are:  Aggressive manicuring.  Nail biting.  Thumb sucking. The most common cause is a staphylococcal (a type of germ) infection, or a fungal (Candida) infection. When caused by a germ, it usually comes on suddenly with redness, swelling, pus and is often painful. It may get under the nail and form an abscess (collection of pus), or form an abscess around the nail. If the nail itself is infected with a fungus, the treatment is usually prolonged and may require oral medicine for up to one year. Your caregiver will determine the length of time treatment is required. The paronychia caused by bacteria (germs) may largely be avoided by not pulling on hangnails or picking at cuticles. When the infection occurs at the tips of the finger it is called felon. When the cause of paronychia is from the herpes simplex virus (HSV) it is called herpetic whitlow. TREATMENT  When an abscess is present treatment is often incision and drainage. This means that the abscess must be cut open so the pus can get out. When this is done, the following home care instructions should be followed. HOME CARE INSTRUCTIONS   It is important to keep the affected fingers very dry. Rubber or plastic gloves over cotton gloves should be used whenever the hand must be placed in water.  Keep wound clean, dry and dressed as suggested by your caregiver between warm soaks or warm compresses.  Soak in warm water for fifteen to twenty minutes three to four times per day for bacterial infections. Fungal  infections are very difficult to treat, so often require treatment for long periods of time.  For bacterial (germ) infections take antibiotics (medicine which kill germs) as directed and finish the prescription, even if the problem appears to be solved before the medicine is gone.  Only take over-the-counter or prescription medicines for pain, discomfort, or fever as directed by your caregiver. SEEK IMMEDIATE MEDICAL CARE IF:  You have redness, swelling, or increasing pain in the wound.  You notice pus coming from the wound.  You have a fever.  You notice a bad smell coming from the wound or dressing. Document Released: 07/17/2000 Document Revised: 04/15/2011 Document Reviewed: 03/18/2008 ExitCare Patient Information 2014 [employer].  

## 2013-04-07 ENCOUNTER — Other Ambulatory Visit: Payer: Self-pay | Admitting: Gastroenterology

## 2013-04-07 ENCOUNTER — Other Ambulatory Visit (INDEPENDENT_AMBULATORY_CARE_PROVIDER_SITE_OTHER): Payer: Medicare Other

## 2013-04-07 ENCOUNTER — Encounter: Payer: Self-pay | Admitting: Internal Medicine

## 2013-04-07 ENCOUNTER — Ambulatory Visit (AMBULATORY_SURGERY_CENTER): Payer: Medicare Other | Admitting: Internal Medicine

## 2013-04-07 VITALS — BP 153/92 | HR 75 | Temp 97.7°F | Resp 17

## 2013-04-07 DIAGNOSIS — D1330 Benign neoplasm of unspecified part of small intestine: Secondary | ICD-10-CM

## 2013-04-07 DIAGNOSIS — R1013 Epigastric pain: Secondary | ICD-10-CM

## 2013-04-07 DIAGNOSIS — K2960 Other gastritis without bleeding: Secondary | ICD-10-CM

## 2013-04-07 LAB — LIPASE: Lipase: 42 U/L (ref 11.0–59.0)

## 2013-04-07 LAB — COMPREHENSIVE METABOLIC PANEL
ALK PHOS: 48 U/L (ref 39–117)
ALT: 26 U/L (ref 0–35)
AST: 22 U/L (ref 0–37)
Albumin: 3.1 g/dL — ABNORMAL LOW (ref 3.5–5.2)
BUN: 15 mg/dL (ref 6–23)
CO2: 26 mEq/L (ref 19–32)
Calcium: 8.6 mg/dL (ref 8.4–10.5)
Chloride: 103 mEq/L (ref 96–112)
Creatinine, Ser: 0.8 mg/dL (ref 0.4–1.2)
GFR: 88.91 mL/min (ref >60.00)
GLUCOSE: 108 mg/dL — AB (ref 70–99)
POTASSIUM: 4.6 meq/L (ref 3.5–5.1)
SODIUM: 137 meq/L (ref 135–145)
TOTAL PROTEIN: 7.5 g/dL (ref 6.0–8.3)
Total Bilirubin: 0.7 mg/dL (ref 0.3–1.2)

## 2013-04-07 LAB — IGA: IgA: 415 mg/dL — ABNORMAL HIGH (ref 68–378)

## 2013-04-07 LAB — AMYLASE: AMYLASE: 54 U/L (ref 27–131)

## 2013-04-07 MED ORDER — SODIUM CHLORIDE 0.9 % IV SOLN: 500.0000 mL | INTRAVENOUS | Status: DC

## 2013-04-07 NOTE — Patient Instructions (Addendum)
YOU HAD AN ENDOSCOPIC PROCEDURE TODAY AT [facility]: Refer to the procedure report that was given to you for any specific questions about what was found during the examination.  If the procedure report does not answer your questions, please call your gastroenterologist to clarify.  If you requested that your care partner not be given the details of your procedure findings, then the procedure report has been included in a sealed envelope for you to review at your convenience later.  YOU SHOULD EXPECT: Some feelings of bloating in the abdomen. Passage of more gas than usual.  Walking can help get rid of the air that was put into your GI tract during the procedure and reduce the bloating. If you had a lower endoscopy (such as a colonoscopy or flexible sigmoidoscopy) you may notice spotting of blood in your stool or on the toilet paper. If you underwent a bowel prep for your procedure, then you may not have a normal bowel movement for a few days.  DIET: Your first meal following the procedure should be a light meal and then it is ok to progress to your normal diet.  A half-sandwich or bowl of soup is an example of a good first meal.  Heavy or fried foods are harder to digest and may make you feel nauseous or bloated.  Likewise meals heavy in dairy and vegetables can cause extra gas to form and this can also increase the bloating.  Drink plenty of fluids but you should avoid alcoholic beverages for 24 hours.  ACTIVITY: Your care partner should take you home directly after the procedure.  You should plan to take it easy, moving slowly for the rest of the day.  You can resume normal activity the day after the procedure however you should NOT DRIVE or use heavy machinery for 24 hours (because of the sedation medicines used during the test).    SYMPTOMS TO REPORT IMMEDIATELY: A gastroenterologist can be reached at any hour.  During normal business hours, 8:30 AM to 5:00 PM Monday through Friday,  call [phone].  After hours and on weekends, please call the GI answering service at [phone] who will take a message and have the physician on call contact you.     Following upper endoscopy (EGD)  Vomiting of blood or coffee ground material  New chest pain or pain under the shoulder blades  Painful or persistently difficult swallowing  New shortness of breath  Fever of 100F or higher  Black, tarry-looking stools  FOLLOW UP: If any biopsies were taken you will be contacted by phone or by letter within the next 1-3 weeks.  Call your gastroenterologist if you have not heard about the biopsies in 3 weeks.  Our staff will call the home number listed on your records the next business day following your procedure to check on you and address any questions or concerns that you may have at that time regarding the information given to you following your procedure. This is a courtesy call and so if there is no answer at the home number and we have not heard from you through the emergency physician on call, we will assume that you have returned to your regular daily activities without incident.  SIGNATURES/CONFIDENTIALITY: You and/or your care partner have signed paperwork which will be entered into your electronic medical record.  These signatures attest to the fact that that the information above on your After Visit Summary has been reviewed and is understood.    Full responsibility of the confidentiality of this discharge information lies with you and/or your care-partner.   Information on gastritis given to you today  Await biopsy results  Lab work today on discharge  Continue anti reflux medication 20 -30 minutes before 1st meal of the day  Bentyl as directed and as needed

## 2013-04-07 NOTE — Progress Notes (Signed)
Called to room to assist during endoscopic procedure.  Patient ID and intended procedure confirmed with present staff. Received instructions for my participation in the procedure from the performing physician.  

## 2013-04-07 NOTE — Op Note (Addendum)
[place]  [address] [place], [address]   ENDOSCOPY PROCEDURE REPORT  PATIENT: Wanda, Oneill  MR#: [id number] BIRTHDATE: 1980/03/07 , 32  yrs. old GENDER: Female ENDOSCOPIST: Jerene Bears, MD REFERRED BY:  Rolm Bookbinder, M.D. PROCEDURE DATE:  04/07/2013 PROCEDURE:  EGD w/ biopsy ASA CLASS:     Class III INDICATIONS:  Epigastric pain. MEDICATIONS: MAC sedation, administered by CRNA and propofol (Diprivan) 200mg  IV TOPICAL ANESTHETIC: none  DESCRIPTION OF PROCEDURE: After the risks benefits and alternatives of the procedure were thoroughly explained, informed consent was obtained.  The LB [id number] endoscope was introduced through the mouth and advanced to the second portion of the duodenum. Without limitations.  The instrument was slowly withdrawn as the mucosa was fully examined.   ESOPHAGUS: The mucosa of the esophagus appeared normal.  Z-line regular at 38 cm.  STOMACH: Mild to moderate gastritis (inflammation) characterized by erythema and edema was found in the entire examined stomach.  There was a small nodule in the antrum, benign appearing.  Multiple biopsies were performed using cold forceps.  DUODENUM: Mild duodenal inflammation was found in the duodenal bulb. The second part of the duodenum had areas of scattered villous flattening.  Multiple biopsies from bulb and second duodenum to exclude celiac.  Retroflexed views revealed no abnormalities.     The scope was then withdrawn from the patient and the procedure completed.  COMPLICATIONS: There were no complications.  ENDOSCOPIC IMPRESSION: 1.   The mucosa of the esophagus appeared normal 2.   Gastritis (inflammation) was found in the entire examined stomach; multiple biopsies 3.   Duodenal inflammation was found in the duodenal bulb and second portion; multiple biopsies  RECOMMENDATIONS: 1.  Await biopsy results 2.  Follow-up of helicobacter pylori status, treat if  indicated 3.  Continue taking your PPI (antiacid medicine) once daily.  It is best to be taken 20-30 minutes prior to breakfast meal. 4.  Bentyl as directed and as needed  eSigned:  Jerene Bears, MD 04/07/2013 10:59 AM      CC:The Patient, Rolm Bookbinder, MD, and Horald Pollen, MD

## 2013-04-07 NOTE — Progress Notes (Signed)
  [facility] Post-op Note  Patient: Wanda Oneill  Procedure(s) Performed: endoscopy  Patient Location: [facility] - Recovery Area  Anesthesia Type: Deep Sedation/Propofol  Level of Consciousness: awake, oriented and patient cooperative  Airway and Oxygen Therapy: Patient Spontanous Breathing  Post-op Pain: none  Post-op Assessment:  Post-op Vital signs reviewed, Patient's Cardiovascular Status Stable, Respiratory Function Stable, Patent Airway, No signs of Nausea or vomiting and Pain level controlled  Post-op Vital Signs: Reviewed and stable  Complications: No apparent anesthesia complications  Tippi Mccrae E 10:57 AM

## 2013-04-08 ENCOUNTER — Telehealth: Payer: Self-pay | Admitting: *Deleted

## 2013-04-08 LAB — TISSUE TRANSGLUTAMINASE, IGA: Tissue Transglutaminase Ab, IgA: 8.2 U/mL (ref <20)

## 2013-04-08 NOTE — Telephone Encounter (Signed)
Left message on pt's mother's cell phone [phone] that we called to f/u

## 2013-04-13 ENCOUNTER — Encounter: Payer: Self-pay | Admitting: Internal Medicine

## 2013-05-08 ENCOUNTER — Other Ambulatory Visit: Payer: Self-pay | Admitting: Physician Assistant

## 2013-05-31 ENCOUNTER — Ambulatory Visit (INDEPENDENT_AMBULATORY_CARE_PROVIDER_SITE_OTHER): Payer: Medicare Other | Admitting: Family Medicine

## 2013-05-31 ENCOUNTER — Ambulatory Visit: Payer: Medicare Other

## 2013-05-31 VITALS — BP 112/70 | HR 69 | Temp 98.0°F | Resp 16 | Ht 63.0 in | Wt 252.3 lb

## 2013-05-31 DIAGNOSIS — M25519 Pain in unspecified shoulder: Secondary | ICD-10-CM

## 2013-05-31 DIAGNOSIS — M25512 Pain in left shoulder: Secondary | ICD-10-CM

## 2013-05-31 MED ORDER — DICLOFENAC SODIUM 1 % TD GEL: 2.0000 g | Freq: Four times a day (QID) | TRANSDERMAL | Status: DC

## 2013-05-31 NOTE — Patient Instructions (Signed)
I have prescribed voltaren gel for you to use on your shoulder- use 2 gm four times a day for a couple of weeks to see if it will help.  However, if this is too expensive do not fill it and let me know; we will think of something else.    You can also use some of your tramadol as needed for pain.  However I would recommend that you avoid taking this along with baclofen because it could make you too sleepy

## 2013-05-31 NOTE — Progress Notes (Signed)
[facility] [address] [place] [address] [phone]  Date:  05/31/2013   Name:  Wanda Oneill   DOB:  [birth year]   MRN:  [id number]  PCP:  [name]., MD    Chief Complaint: Shoulder Pain   History of Present Illness:  Wanda Oneill is a 33 y.o. very pleasant female patient who presents with the following:  Wanda Oneill is a charming young woman with Down syndrome; she is high functioning, employed at [employer] and keeps her own apartment.   Here today with a problem with her shoulder.  She was seen by my partner Dr. Ouida Sills in September with pain in her left shoulder- she was treated with a depo- medrol and marcaine injection., naproxen as needed.  She felt better  Her shoulder is hurting her again. She does not recall if she ever had an injection any other time in the past.  Her shoulder has been hurting again for the last week or so.  She is taking some baclofen that helps her some.   She has been carrying a heavy purse, and some bags in her day to day activities.  However otherwise she is not aware of anything that she has done out of the ordinary.  She does an exercise class sometimes and does lift light weights but has not been aware of any injury.    She has more pain at night when she tries to lay down.  It is difficult for her to get comfortable.  She also has more pain when she wakes up in the morning. She does take tramadol sometimes for her feet; she does have this at home.    Patient Active Problem List   Diagnosis Date Noted  . Down syndrome 11/11/2011  . Hernia of abdominal wall 10/21/2011  . Psoriasis 10/21/2011  . Type 2 diabetes mellitus, uncontrolled 04/12/2011  . Hypothyroid 04/12/2011  . Gait abnormality 06/22/2010  . Tibial tendinitis, posterior 06/22/2010    Past Medical History  Diagnosis Date  . Diabetes mellitus     type II  . Thyroid disease     hypothyroidism  . Down syndrome   . Hidradenitis   . Tendonitis    chronic in left foot  . Cancer     thyroid    Past Surgical History  Procedure Laterality Date  . Cholecystectomy    . Axillary hidradenitis excision    . Hernia repair      lvh with mesh  . Tonsilectomy, adenoidectomy, bilateral myringotomy and tubes      History  Substance Use Topics  . Smoking status: Never Smoker   . Smokeless tobacco: Never Used  . Alcohol Use: No    History reviewed. No pertinent family history.  Allergies  Allergen Reactions  . Cefuroxime Axetil Hives  . Sulfa Antibiotics Hives    Medication list has been reviewed and updated.  Current Outpatient Prescriptions on File Prior to Visit  Medication Sig Dispense Refill  . acetaminophen (TYLENOL) 500 MG tablet Take 500 mg by mouth 2 (two) times daily as needed.      Marland Kitchen allopurinol (ZYLOPRIM) 300 MG tablet Take 300 mg by mouth daily.      Marland Kitchen augmented betamethasone dipropionate (DIPROLENE-AF) 0.05 % cream Apply topically 2 (two) times daily.  50 g  0  . baclofen (LIORESAL) 10 MG tablet Take 10 mg by mouth daily as needed.      . clobetasol cream (TEMOVATE) 0.05 % daily as  needed.      . dicyclomine (BENTYL) 10 MG capsule Take 2 capsules (20 mg total) by mouth 4 (four) times daily -  before meals and at bedtime.  90 capsule  6  . INVOKANA 300 MG TABS Take 300 mg by mouth daily.      Marland Kitchen levothyroxine (SYNTHROID, LEVOTHROID) 137 MCG tablet Take 1 tablet (137 mcg total) by mouth daily before breakfast. PATIENT NEEDS OFFICE VISIT/LABS FOR ADDITIONAL REFILLS  30 tablet  0  . naproxen sodium (ANAPROX) 220 MG tablet Take 220 mg by mouth 2 (two) times daily with a meal.        . norgestimate-ethinyl estradiol (ORTHO-CYCLEN,SPRINTEC,PREVIFEM) 0.25-35 MG-MCG tablet Take 1 tablet by mouth daily.      . sitaGLIPtin (JANUVIA) 100 MG tablet Take 1 tablet (100 mg total) by mouth daily.  90 tablet  3  . amoxicillin-clavulanate (AUGMENTIN) 875-125 MG per tablet Take 1 tablet by mouth 2 (two) times daily.  20 tablet  0  .  omeprazole (PRILOSEC) 20 MG capsule Take 1 capsule (20 mg total) by mouth daily.  30 capsule  6  . ONE TOUCH ULTRA TEST test strip USE TO CHECK BLOOD SUGAR ONCE DAILY (ICD CODE:25.00)  100 each  3  . traMADol (ULTRAM) 50 MG tablet Take 1 tablet (50 mg total) by mouth every 6 (six) hours as needed.  30 tablet  5   Current Facility-Administered Medications on File Prior to Visit  Medication Dose Route Frequency Provider Last Rate Last Dose  . methylPREDNISolone acetate (DEPO-MEDROL) injection 80 mg  80 mg Intramuscular Once Ellison Carwin, MD      . ondansetron (ZOFRAN-ODT) disintegrating tablet 4 mg  4 mg Oral Once Theda Sers, PA-C        Review of Systems:  As per HPI- otherwise negative. She has no exertional pain, no chest pain.   Physical Examination: Filed Vitals:   05/31/13 1339  BP: 112/70  Pulse: 69  Temp: 98 F (36.7 C)  Resp: 16   Filed Vitals:   05/31/13 1339  Height: 5\' 3"  (1.6 m)  Weight: 252 lb 4.8 oz (114.443 kg)   Body mass index is 44.7 kg/(m^2). Ideal Body Weight: Weight in (lb) to have BMI = 25: 140.8  GEN: WDWN, NAD, Non-toxic, A & O x 3, overweight, looks well HEENT: Atraumatic, Normocephalic. Neck supple. No masses, No LAD. Ears and Nose: No external deformity. CV: RRR, No M/G/R. No JVD. No thrill. No extra heart sounds. PULM: CTA B, no wheezes, crackles, rhonchi. No retractions. No resp. distress. No accessory muscle use. EXTR: No c/c/e NEURO Normal gait.  PSYCH: Normally interactive. Conversant. Not depressed or anxious appearing.  Calm demeanor.  Left shoulder: she has full ROM to flexion, abduction, internal and external rotation.  No sign of impingement.  She is slightly tender over the [facility] joint.  No effusion, redness or swelling.  Normal UE strength bilaterally  [facility] reading (PRIMARY) by  Dr. Lorelei Pont. Left shoulder:  Normal shoulder  LEFT SHOULDER - 2+ VIEW  COMPARISON: None.  FINDINGS: There is no evidence of fracture or  dislocation. There is mild degenerative joint changes of the left acromioclavicular joint with mild increased sclerosis of the acromion and distal clavicle. Soft tissues are unremarkable.  IMPRESSION: No acute fracture or dislocation. Mild degenerative joint changes of the left acromioclavicular joint.  Assessment and Plan: Left shoulder pain - Plan: DG Shoulder Left, diclofenac sodium (VOLTAREN) 1 % GEL  Laiklyn is here today with  left shoulder pain.  She has recently had some GI troubles- gastritis.  Would prefer to avoid oral NSAIDs if possible. rx for voltaren gel; however she is aware that this might not be covered well by her insurance and will discuss with her parents prior to filling. If sx persist will refer to ortho for further evaluation.    Signed Lamar Blinks, MD

## 2013-06-01 ENCOUNTER — Telehealth: Payer: Self-pay

## 2013-06-01 ENCOUNTER — Encounter: Payer: Self-pay | Admitting: Family Medicine

## 2013-06-01 NOTE — Telephone Encounter (Signed)
PA needed for voltaren gel. Completed [place] form and faxed.

## 2013-08-31 ENCOUNTER — Other Ambulatory Visit: Payer: Self-pay | Admitting: Radiology

## 2013-08-31 MED ORDER — BLOOD GLUCOSE METER KIT: PACK | Status: AC

## 2013-08-31 NOTE — Telephone Encounter (Signed)
Sent in glucose meter per Email from patients mother.

## 2013-12-23 NOTE — Telephone Encounter (Signed)
I checked w/[employer] on this because never heard back and they don't have a record of it. I resent it to new fax # provided.

## 2013-12-24 NOTE — Telephone Encounter (Signed)
PA was denied bc it has not been approved for use for shoulder, spine or hip. Notified pharm.

## 2014-07-08 ENCOUNTER — Encounter: Payer: Self-pay | Admitting: *Deleted

## 2014-07-08 NOTE — Telephone Encounter (Signed)
This encounter was created in error - please disregard.

## 2014-07-27 DIAGNOSIS — K529 Noninfective gastroenteritis and colitis, unspecified: Secondary | ICD-10-CM | POA: Insufficient documentation

## 2014-08-01 ENCOUNTER — Ambulatory Visit: Payer: Medicaid Other | Admitting: Family Medicine

## 2014-08-01 ENCOUNTER — Other Ambulatory Visit: Payer: Self-pay | Admitting: Internal Medicine

## 2014-08-04 ENCOUNTER — Encounter: Payer: Self-pay | Admitting: Family Medicine

## 2014-08-04 ENCOUNTER — Ambulatory Visit (INDEPENDENT_AMBULATORY_CARE_PROVIDER_SITE_OTHER): Payer: Medicare Other | Admitting: Family Medicine

## 2014-08-04 VITALS — BP 135/83 | HR 75 | Ht 64.0 in | Wt 245.0 lb

## 2014-08-04 DIAGNOSIS — M79672 Pain in left foot: Secondary | ICD-10-CM

## 2014-08-09 DIAGNOSIS — M79672 Pain in left foot: Secondary | ICD-10-CM | POA: Insufficient documentation

## 2014-08-09 NOTE — Assessment & Plan Note (Signed)
consistent with metatarsalgia.  Metatarsal pads placed today.  Encouraged regular use, avoiding flat shoes.  Consider icing, nsaids if needed.  Consider custom orthotics if she doesn't continue to improve.  F/u in 1 month or prn.

## 2014-08-09 NOTE — Progress Notes (Signed)
PCP: [name]., MD  Subjective:   HPI: Patient is a 34 y.o. female here for left foot pain.  Patient reports for about 2-3 weeks she's had left foot pain mainly in ball of foot. No known injuries or increase in activities. Pain level up to 8/10 at times. Has history of post tib tendinitis but this is different. No swelling or bruising.  Past Medical History  Diagnosis Date  . Diabetes mellitus     type II  . Thyroid disease     hypothyroidism  . Down syndrome   . Hidradenitis   . Tendonitis     chronic in left foot  . Cancer     thyroid    Current Outpatient Prescriptions on File Prior to Visit  Medication Sig Dispense Refill  . acetaminophen (TYLENOL) 500 MG tablet Take 500 mg by mouth 2 (two) times daily as needed.    Marland Kitchen allopurinol (ZYLOPRIM) 300 MG tablet Take 300 mg by mouth daily.    Marland Kitchen augmented betamethasone dipropionate (DIPROLENE-AF) 0.05 % cream Apply topically 2 (two) times daily. 50 g 0  . baclofen (LIORESAL) 10 MG tablet Take 10 mg by mouth daily as needed.    . Blood Glucose Monitoring Suppl (BLOOD GLUCOSE METER KIT AND SUPPLIES) Dispense based on patient and insurance preference. To check blood sugars daily FOR ICD-9 250.00 1 each 0  . clobetasol cream (TEMOVATE) 0.05 % daily as needed.    . diclofenac sodium (VOLTAREN) 1 % GEL Apply 2 g topically 4 (four) times daily. 200 g 2  . dicyclomine (BENTYL) 10 MG capsule Take 2 capsules (20 mg total) by mouth 4 (four) times daily -  before meals and at bedtime. 90 capsule 6  . INVOKANA 300 MG TABS Take 300 mg by mouth daily.    Marland Kitchen levothyroxine (SYNTHROID, LEVOTHROID) 137 MCG tablet Take 1 tablet (137 mcg total) by mouth daily before breakfast. PATIENT NEEDS OFFICE VISIT/LABS FOR ADDITIONAL REFILLS 30 tablet 0  . naproxen sodium (ANAPROX) 220 MG tablet Take 220 mg by mouth 2 (two) times daily with a meal.      . norgestimate-ethinyl estradiol (ORTHO-CYCLEN,SPRINTEC,PREVIFEM) 0.25-35 MG-MCG tablet Take 1 tablet by  mouth daily.    Marland Kitchen omeprazole (PRILOSEC) 20 MG capsule TAKE 1 CAPSULE (20 MG TOTAL) BY MOUTH DAILY. 90 capsule 0  . ONE TOUCH ULTRA TEST test strip USE TO CHECK BLOOD SUGAR ONCE DAILY (ICD CODE:25.00) 100 each 3  . sitaGLIPtin (JANUVIA) 100 MG tablet Take 1 tablet (100 mg total) by mouth daily. 90 tablet 3  . traMADol (ULTRAM) 50 MG tablet Take 1 tablet (50 mg total) by mouth every 6 (six) hours as needed. 30 tablet 5   Current Facility-Administered Medications on File Prior to Visit  Medication Dose Route Frequency Provider Last Rate Last Dose  . methylPREDNISolone acetate (DEPO-MEDROL) injection 80 mg  80 mg Intramuscular Once Roselee Culver, MD        Past Surgical History  Procedure Laterality Date  . Cholecystectomy    . Axillary hidradenitis excision    . Hernia repair      lvh with mesh  . Tonsilectomy, adenoidectomy, bilateral myringotomy and tubes      Allergies  Allergen Reactions  . Cefuroxime Axetil Hives  . Sulfa Antibiotics Hives    History   Social History  . Marital Status: Single    Spouse Name: N/A  . Number of Children: N/A  . Years of Education: N/A   Occupational History  .  disabled    Social History Main Topics  . Smoking status: Never Smoker   . Smokeless tobacco: Never Used  . Alcohol Use: No  . Drug Use: No  . Sexual Activity: Not on file   Other Topics Concern  . Not on file   Social History Narrative    No family history on file.  BP 135/83 mmHg  Pulse 75  Ht 5' 4" (1.626 m)  Wt 245 lb (111.131 kg)  BMI 42.03 kg/m2  Review of Systems: See HPI above.    Objective:  Physical Exam:  Gen: NAD  Left foot/ankle: No gross deformity, swelling, ecchymoses Transverse arch collapse with callus over all MT heads. FROM ankle without pain, 5/5 strength. TTP 2nd > 3rd metatarsals plantar and dorsal surfaces. Negative ant drawer and talar tilt.   Negative syndesmotic compression. Mild pain metatarsal squeeze. Thompsons test  negative. NV intact distally.  MSK u/s: No evidence cortical irregularity, neovascularity, edema overlying cortices of metatarsals of left foot.    Assessment & Plan:  1. Left foot pain - consistent with metatarsalgia.  Metatarsal pads placed today.  Encouraged regular use, avoiding flat shoes.  Consider icing, nsaids if needed.  Consider custom orthotics if she doesn't continue to improve.  F/u in 1 month or prn.

## 2014-09-14 ENCOUNTER — Ambulatory Visit (INDEPENDENT_AMBULATORY_CARE_PROVIDER_SITE_OTHER): Payer: Medicare Other | Admitting: Family Medicine

## 2014-09-14 ENCOUNTER — Encounter: Payer: Self-pay | Admitting: *Deleted

## 2014-09-14 VITALS — BP 123/79 | HR 76 | Ht 64.0 in | Wt 240.0 lb

## 2014-09-14 DIAGNOSIS — M79672 Pain in left foot: Secondary | ICD-10-CM

## 2014-09-14 NOTE — Patient Instructions (Signed)
Break in the custom orthotics as we discussed - 2-3 hours today, 5-6 hours tomorrow, 9-10 hours on Friday, etc. If you're having problems after a week or two give me a call and we'll make some adjustments. Otherwise follow up with me in 5-6 weeks.

## 2014-09-20 NOTE — Assessment & Plan Note (Signed)
consistent with metatarsalgia.  Did not tolerate metatarsal pads in sports insoles despite proper placement.  Will try custom orthotics instead - these felt very comfortable in the office after making them for her.  F/u in 5-6 weeks but call if any problems before that.   Patient was fitted for a : standard, cushioned, semi-rigid orthotic. The orthotic was heated and afterward the patient stood on the orthotic blank positioned on the orthotic stand. The patient was positioned in subtalar neutral position and 10 degrees of ankle dorsiflexion in a weight bearing stance. After completion of molding, a stable base was applied to the orthotic blank. The blank was ground to a stable position for weight bearing. Size: 9 blue swirl  Base: blue med density eva Posting: none Additional orthotic padding: none Total prep time 45 minutes.

## 2014-09-20 NOTE — Progress Notes (Signed)
PCP: [name]., MD  Subjective:   HPI: Patient is a 34 y.o. female here for left foot pain.  6/30: Patient reports for about 2-3 weeks she's had left foot pain mainly in ball of foot. No known injuries or increase in activities. Pain level up to 8/10 at times. Has history of post tib tendinitis but this is different. No swelling or bruising.  8/10: Patient reports she continues to have pain despite metatarsal pads, sports insoles. She would like to go ahead with custom orthotics.  Past Medical History  Diagnosis Date  . Diabetes mellitus     type II  . Thyroid disease     hypothyroidism  . Down syndrome   . Hidradenitis   . Tendonitis     chronic in left foot  . Cancer     thyroid  . Periumbilical hernia   . Gastritis     Current Outpatient Prescriptions on File Prior to Visit  Medication Sig Dispense Refill  . acetaminophen (TYLENOL) 500 MG tablet Take 500 mg by mouth 2 (two) times daily as needed.    Marland Kitchen allopurinol (ZYLOPRIM) 300 MG tablet Take 300 mg by mouth daily.    Marland Kitchen augmented betamethasone dipropionate (DIPROLENE-AF) 0.05 % cream Apply topically 2 (two) times daily. 50 g 0  . baclofen (LIORESAL) 10 MG tablet Take 10 mg by mouth daily as needed.    . Blood Glucose Monitoring Suppl (BLOOD GLUCOSE METER KIT AND SUPPLIES) Dispense based on patient and insurance preference. To check blood sugars daily FOR ICD-9 250.00 1 each 0  . clobetasol cream (TEMOVATE) 0.05 % daily as needed.    . diclofenac sodium (VOLTAREN) 1 % GEL Apply 2 g topically 4 (four) times daily. 200 g 2  . dicyclomine (BENTYL) 10 MG capsule Take 2 capsules (20 mg total) by mouth 4 (four) times daily -  before meals and at bedtime. 90 capsule 6  . INVOKANA 300 MG TABS Take 300 mg by mouth daily.    Marland Kitchen levothyroxine (SYNTHROID, LEVOTHROID) 137 MCG tablet Take 1 tablet (137 mcg total) by mouth daily before breakfast. PATIENT NEEDS OFFICE VISIT/LABS FOR ADDITIONAL REFILLS 30 tablet 0  . naproxen  sodium (ANAPROX) 220 MG tablet Take 220 mg by mouth 2 (two) times daily with a meal.      . norgestimate-ethinyl estradiol (ORTHO-CYCLEN,SPRINTEC,PREVIFEM) 0.25-35 MG-MCG tablet Take 1 tablet by mouth daily.    Marland Kitchen omeprazole (PRILOSEC) 20 MG capsule TAKE 1 CAPSULE (20 MG TOTAL) BY MOUTH DAILY. 90 capsule 0  . ONE TOUCH ULTRA TEST test strip USE TO CHECK BLOOD SUGAR ONCE DAILY (ICD CODE:25.00) 100 each 3  . sitaGLIPtin (JANUVIA) 100 MG tablet Take 1 tablet (100 mg total) by mouth daily. 90 tablet 3  . traMADol (ULTRAM) 50 MG tablet Take 1 tablet (50 mg total) by mouth every 6 (six) hours as needed. 30 tablet 5   Current Facility-Administered Medications on File Prior to Visit  Medication Dose Route Frequency Provider Last Rate Last Dose  . methylPREDNISolone acetate (DEPO-MEDROL) injection 80 mg  80 mg Intramuscular Once Roselee Culver, MD        Past Surgical History  Procedure Laterality Date  . Cholecystectomy    . Axillary hidradenitis excision    . Hernia repair      multiple  . Tonsilectomy, adenoidectomy, bilateral myringotomy and tubes      Allergies  Allergen Reactions  . Cefuroxime Axetil Hives  . Sulfa Antibiotics Hives    Social History  Social History  . Marital Status: Single    Spouse Name: N/A  . Number of Children: N/A  . Years of Education: N/A   Occupational History  . disabled    Social History Main Topics  . Smoking status: Never Smoker   . Smokeless tobacco: Never Used  . Alcohol Use: No  . Drug Use: No  . Sexual Activity: Not on file   Other Topics Concern  . Not on file   Social History Narrative    No family history on file.  BP 123/79 mmHg  Pulse 76  Ht 5' 4"  (1.626 m)  Wt 240 lb (108.863 kg)  BMI 41.18 kg/m2  Review of Systems: See HPI above.    Objective:  Physical Exam:  Gen: NAD  Left foot/ankle: No gross deformity, swelling, ecchymoses Transverse arch collapse with callus over all MT heads. FROM ankle without  pain, 5/5 strength. TTP 2nd > 3rd metatarsals plantar and dorsal surfaces. Negative ant drawer and talar tilt.   Negative syndesmotic compression. Mild pain metatarsal squeeze. Thompsons test negative. NV intact distally.   Assessment & Plan:  1. Left foot pain - consistent with metatarsalgia.  Did not tolerate metatarsal pads in sports insoles despite proper placement.  Will try custom orthotics instead - these felt very comfortable in the office after making them for her.  F/u in 5-6 weeks but call if any problems before that.   Patient was fitted for a : standard, cushioned, semi-rigid orthotic. The orthotic was heated and afterward the patient stood on the orthotic blank positioned on the orthotic stand. The patient was positioned in subtalar neutral position and 10 degrees of ankle dorsiflexion in a weight bearing stance. After completion of molding, a stable base was applied to the orthotic blank. The blank was ground to a stable position for weight bearing. Size: 9 blue swirl  Base: blue med density eva Posting: none Additional orthotic padding: none Total prep time 45 minutes.

## 2014-10-19 ENCOUNTER — Ambulatory Visit: Payer: Medicaid Other | Admitting: Internal Medicine

## 2014-10-27 ENCOUNTER — Encounter: Payer: Self-pay | Admitting: Family Medicine

## 2014-10-27 ENCOUNTER — Ambulatory Visit (INDEPENDENT_AMBULATORY_CARE_PROVIDER_SITE_OTHER): Payer: Medicare Other | Admitting: Family Medicine

## 2014-10-27 VITALS — BP 104/63 | HR 78 | Ht 64.0 in | Wt 254.0 lb

## 2014-10-27 DIAGNOSIS — M79672 Pain in left foot: Secondary | ICD-10-CM

## 2014-11-01 NOTE — Progress Notes (Signed)
PCP: [name]., MD  Subjective:   HPI: Patient is a 34 y.o. female here for left foot pain.  6/30: Patient reports for about 2-3 weeks she's had left foot pain mainly in ball of foot. No known injuries or increase in activities. Pain level up to 8/10 at times. Has history of post tib tendinitis but this is different. No swelling or bruising.  8/10: Patient reports she continues to have pain despite metatarsal pads, sports insoles. She would like to go ahead with custom orthotics.  9/22: Patient reports she's still having 7/10 level of pain despite orthotics. Pain worse with walking.  Past Medical History  Diagnosis Date  . Diabetes mellitus     type II  . Thyroid disease     hypothyroidism  . Down syndrome   . Hidradenitis   . Tendonitis     chronic in left foot  . Cancer     thyroid  . Periumbilical hernia   . Gastritis     Current Outpatient Prescriptions on File Prior to Visit  Medication Sig Dispense Refill  . acetaminophen (TYLENOL) 500 MG tablet Take 500 mg by mouth 2 (two) times daily as needed.    Marland Kitchen allopurinol (ZYLOPRIM) 300 MG tablet Take 300 mg by mouth daily.    Marland Kitchen augmented betamethasone dipropionate (DIPROLENE-AF) 0.05 % cream Apply topically 2 (two) times daily. 50 g 0  . baclofen (LIORESAL) 10 MG tablet Take 10 mg by mouth daily as needed.    . Blood Glucose Monitoring Suppl (BLOOD GLUCOSE METER KIT AND SUPPLIES) Dispense based on patient and insurance preference. To check blood sugars daily FOR ICD-9 250.00 1 each 0  . clobetasol cream (TEMOVATE) 0.05 % daily as needed.    . diclofenac sodium (VOLTAREN) 1 % GEL Apply 2 g topically 4 (four) times daily. 200 g 2  . dicyclomine (BENTYL) 10 MG capsule Take 2 capsules (20 mg total) by mouth 4 (four) times daily -  before meals and at bedtime. 90 capsule 6  . INVOKANA 300 MG TABS Take 300 mg by mouth daily.    Marland Kitchen levothyroxine (SYNTHROID, LEVOTHROID) 137 MCG tablet Take 1 tablet (137 mcg total) by  mouth daily before breakfast. PATIENT NEEDS OFFICE VISIT/LABS FOR ADDITIONAL REFILLS 30 tablet 0  . naproxen sodium (ANAPROX) 220 MG tablet Take 220 mg by mouth 2 (two) times daily with a meal.      . norgestimate-ethinyl estradiol (ORTHO-CYCLEN,SPRINTEC,PREVIFEM) 0.25-35 MG-MCG tablet Take 1 tablet by mouth daily.    Marland Kitchen omeprazole (PRILOSEC) 20 MG capsule TAKE 1 CAPSULE (20 MG TOTAL) BY MOUTH DAILY. 90 capsule 0  . ONE TOUCH ULTRA TEST test strip USE TO CHECK BLOOD SUGAR ONCE DAILY (ICD CODE:25.00) 100 each 3  . sitaGLIPtin (JANUVIA) 100 MG tablet Take 1 tablet (100 mg total) by mouth daily. 90 tablet 3  . traMADol (ULTRAM) 50 MG tablet Take 1 tablet (50 mg total) by mouth every 6 (six) hours as needed. 30 tablet 5   Current Facility-Administered Medications on File Prior to Visit  Medication Dose Route Frequency Provider Last Rate Last Dose  . methylPREDNISolone acetate (DEPO-MEDROL) injection 80 mg  80 mg Intramuscular Once Roselee Culver, MD        Past Surgical History  Procedure Laterality Date  . Cholecystectomy    . Axillary hidradenitis excision    . Hernia repair      multiple  . Tonsilectomy, adenoidectomy, bilateral myringotomy and tubes      Allergies  Allergen  Reactions  . Cefuroxime Axetil Hives  . Sulfa Antibiotics Hives    Social History   Social History  . Marital Status: Single    Spouse Name: N/A  . Number of Children: N/A  . Years of Education: N/A   Occupational History  . disabled    Social History Main Topics  . Smoking status: Never Smoker   . Smokeless tobacco: Never Used  . Alcohol Use: No  . Drug Use: No  . Sexual Activity: Not on file   Other Topics Concern  . Not on file   Social History Narrative    No family history on file.  BP 104/63 mmHg  Pulse 78  Ht 5' 4"  (1.626 m)  Wt 254 lb (115.214 kg)  BMI 43.58 kg/m2  Review of Systems: See HPI above.    Objective:  Physical Exam:  Gen: NAD  Left foot/ankle: No gross  deformity, swelling, ecchymoses Transverse arch collapse with callus over all MT heads. FROM ankle without pain, 5/5 strength. TTP 2nd metatarsal plantar surface. Negative ant drawer and talar tilt.   Negative syndesmotic compression. Mild pain metatarsal squeeze. Thompsons test negative. NV intact distally.   Assessment & Plan:  1. Left foot pain - consistent with metatarsalgia.  Still having trouble with this despite trial of metatarsal pads, custom orthotics.  Added some padding to plantar portion of orthotic so painful area of 2nd MT head drops in this area.  Discussed we could try MT pads again but in the orthotic if this does not work as well.  F/u prn.

## 2014-11-01 NOTE — Assessment & Plan Note (Signed)
consistent with metatarsalgia.  Still having trouble with this despite trial of metatarsal pads, custom orthotics.  Added some padding to plantar portion of orthotic so painful area of 2nd MT head drops in this area.  Discussed we could try MT pads again but in the orthotic if this does not work as well.  F/u prn.

## 2014-11-21 ENCOUNTER — Ambulatory Visit (INDEPENDENT_AMBULATORY_CARE_PROVIDER_SITE_OTHER): Payer: Medicare Other | Admitting: Internal Medicine

## 2014-11-21 ENCOUNTER — Other Ambulatory Visit: Payer: Medicare Other

## 2014-11-21 ENCOUNTER — Encounter: Payer: Self-pay | Admitting: Internal Medicine

## 2014-11-21 VITALS — BP 96/50 | HR 80 | Ht 63.39 in | Wt 250.2 lb

## 2014-11-21 DIAGNOSIS — R1013 Epigastric pain: Secondary | ICD-10-CM

## 2014-11-21 DIAGNOSIS — K580 Irritable bowel syndrome with diarrhea: Secondary | ICD-10-CM

## 2014-11-21 DIAGNOSIS — R140 Abdominal distension (gaseous): Secondary | ICD-10-CM | POA: Diagnosis not present

## 2014-11-21 NOTE — Progress Notes (Signed)
Subjective:    Patient ID: Wanda Oneill, female    DOB: August 26, 1980, 34 y.o.   MRN: [id number]  HPI Wanda Oneill is a 34 year old female with history of GERD, dyspepsia, Down syndrome, hypothyroidism, type 2 diabetes, ventral hernia status post several repairs, history of cholecystectomy who is seen to evaluate epigastric and midabdominal pain with chronic loose stools. Loose stools have been more of an issue for her lately. She was last seen in the office in January 2015. She is having primarily postprandial loose stools and can have bowel movements multiple times a day as much as 10 times a day on days that her "bad". Fried and greasy foods tend to make her abdominal discomfort and loose stools worse. She denies rectal bleeding or melena. She was using Bentyl 20 mg 1-2 tablets in the morning and this considerably help symptoms but she was not using the additional daily doses on schedule. Her mother feels this may be why she notices more mid abdominal discomfort later in the day. She is taking omeprazole 20 mg once daily. She denies nausea and vomiting. Weight has been stable. She does report frequent gas and bloating.  Previously upper endoscopy was performed on 04/07/2013 -- this revealed normal esophagus, mild gastritis and mild duodenal bulb inflammation. Biopsies showed benign small bowel and minimal chronic inflammation in the stomach. There is no metaplasia, dysplasia or H. pylori. There is no evidence of celiac disease. TTG was also negative  Review of Systems As per history of present illness, otherwise negative  Current Medications, Allergies, Past Medical History, Past Surgical History, Family History and Social History were reviewed in [employer] record.     Objective:   Physical Exam BP 96/50 mmHg  Pulse 80  Ht 5' 3.39" (1.61 m)  Wt 250 lb 4 oz (113.513 kg)  BMI 43.79 kg/m2 Constitutional: Well-developed and well-nourished. No distress. HEENT:  Normocephalic and atraumatic. Oropharynx is clear and moist. No oropharyngeal exudate. Conjunctivae are normal.  No scleral icterus. Neck: Neck supple. Trachea midline. Cardiovascular: Normal rate, regular rhythm and intact distal pulses. No M/R/G Pulmonary/chest: Effort normal and breath sounds normal. No wheezing, rales or rhonchi. Abdominal: Soft,obese, nontender, nondistended. Bowel sounds active throughout. There are no masses palpable. No hepatosplenomegaly. Extremities: no clubbing, cyanosis, or edema Lymphadenopathy: No cervical adenopathy noted. Neurological: Alert and oriented to person place and time. Skin: Skin is warm and dry. Typical appearing psoriatic rash on elbows and knees Psychiatric: Normal mood and affect. Behavior is normal.     Assessment & Plan:   34 year old female with history of GERD, dyspepsia, Down syndrome, hypothyroidism, type 2 diabetes, ventral hernia status post several repairs, history of cholecystectomy who is seen to evaluate epigastric and midabdominal pain with chronic loose stools.  1. Mid abd pain/post-prandial loose stools/diarrhea -- differential includes IBS, pancreatic insufficiency, bile salt diarrhea and bacterial overgrowth. Celiac disease has been excluded previously by TTG and small bowel biopsy. I do think she would benefit from Bentyl on a scheduled basis and I have recommended she continue this 3 times daily. Stool test for fecal elastase and GI pathogen panel. We had a thorough discussion regarding possible treatment pathways. We'll await stool studies and give a trial of rifaximin 550 3 times a day 14 days for IBS with diarrhea predominance. If this fails to improve symptoms my next option would be to try Viberzi or cholestyramine. Office follow-up in 2-3 months, sooner if necessary  25 minutes spent with patient and mother today

## 2014-11-21 NOTE — Patient Instructions (Signed)
Your physician has requested that you go to the basement for the following lab work before leaving today: GI pathogen panel, fecal elastace  Continue Bentyl 40 mg four times daily as needed.  We have sent Xifaxan to [facility]. You should hear from them within the next several days.  Please follow up with Dr Hilarie Fredrickson in 3 months.  Call our office in 1 month to let us know how you are doing after the [place].

## 2014-11-29 ENCOUNTER — Encounter: Payer: Self-pay | Admitting: Family Medicine

## 2014-11-29 ENCOUNTER — Ambulatory Visit (INDEPENDENT_AMBULATORY_CARE_PROVIDER_SITE_OTHER): Payer: Medicare Other | Admitting: Family Medicine

## 2014-11-29 VITALS — BP 119/72 | Ht 64.0 in | Wt 250.0 lb

## 2014-11-29 DIAGNOSIS — M79672 Pain in left foot: Secondary | ICD-10-CM | POA: Diagnosis not present

## 2014-12-01 NOTE — Assessment & Plan Note (Signed)
consistent with metatarsalgia.  Still having trouble with this despite trial of metatarsal pads, custom orthotics, cutout made for 2nd MT head to drop in orthotic.  Decided to add small amount of blue cushion to act as a metatarsal pad to see if she tolerates this better.  Discussed only other consideration would be cushion directly under MT head which should increase pain though.  She will follow up as needed.

## 2014-12-01 NOTE — Progress Notes (Signed)
PCP: [name]., MD  Subjective:   HPI: Patient is a 34 y.o. female here for left foot pain.  6/30: Patient reports for about 2-3 weeks she's had left foot pain mainly in ball of foot. No known injuries or increase in activities. Pain level up to 8/10 at times. Has history of post tib tendinitis but this is different. No swelling or bruising.  8/10: Patient reports she continues to have pain despite metatarsal pads, sports insoles. She would like to go ahead with custom orthotics.  9/22: Patient reports she's still having 7/10 level of pain despite orthotics. Pain worse with walking.  10/25: Patient continues to struggle with plantar foot pain around 2nd metatarsal. Reports can get up to 7-10/10. Worse when on feet a long time, sharp. No skin changes, fever, other complaints.  Past Medical History  Diagnosis Date  . Diabetes mellitus     type II  . Thyroid disease     hypothyroidism  . Down syndrome   . Hidradenitis   . Tendonitis     chronic in left foot  . Cancer ([place])     thyroid  . Periumbilical hernia   . Gastritis     Current Outpatient Prescriptions on File Prior to Visit  Medication Sig Dispense Refill  . acetaminophen (TYLENOL) 500 MG tablet Take 500 mg by mouth 2 (two) times daily as needed.    Marland Kitchen allopurinol (ZYLOPRIM) 300 MG tablet Take 300 mg by mouth daily.    Marland Kitchen augmented betamethasone dipropionate (DIPROLENE-AF) 0.05 % cream Apply topically 2 (two) times daily. 50 g 0  . baclofen (LIORESAL) 10 MG tablet Take 10 mg by mouth daily as needed.    . Blood Glucose Monitoring Suppl (BLOOD GLUCOSE METER KIT AND SUPPLIES) Dispense based on patient and insurance preference. To check blood sugars daily FOR ICD-9 250.00 1 each 0  . clobetasol cream (TEMOVATE) 0.05 % daily as needed.    . dicyclomine (BENTYL) 10 MG capsule Take 2 capsules (20 mg total) by mouth 4 (four) times daily -  before meals and at bedtime. 90 capsule 6  . INVOKANA 300 MG TABS Take  300 mg by mouth daily.    Marland Kitchen levothyroxine (SYNTHROID, LEVOTHROID) 137 MCG tablet Take 1 tablet (137 mcg total) by mouth daily before breakfast. PATIENT NEEDS OFFICE VISIT/LABS FOR ADDITIONAL REFILLS 30 tablet 0  . naproxen sodium (ANAPROX) 220 MG tablet Take 220 mg by mouth 2 (two) times daily with a meal.      . norgestimate-ethinyl estradiol (ORTHO-CYCLEN,SPRINTEC,PREVIFEM) 0.25-35 MG-MCG tablet Take 1 tablet by mouth daily.    Marland Kitchen omeprazole (PRILOSEC) 20 MG capsule TAKE 1 CAPSULE (20 MG TOTAL) BY MOUTH DAILY. 90 capsule 0  . ONE TOUCH ULTRA TEST test strip USE TO CHECK BLOOD SUGAR ONCE DAILY (ICD CODE:25.00) 100 each 3  . sitaGLIPtin (JANUVIA) 100 MG tablet Take 1 tablet (100 mg total) by mouth daily. 90 tablet 3  . traMADol (ULTRAM) 50 MG tablet Take 1 tablet (50 mg total) by mouth every 6 (six) hours as needed. 30 tablet 5   Current Facility-Administered Medications on File Prior to Visit  Medication Dose Route Frequency Provider Last Rate Last Dose  . methylPREDNISolone acetate (DEPO-MEDROL) injection 80 mg  80 mg Intramuscular Once Roselee Culver, MD        Past Surgical History  Procedure Laterality Date  . Cholecystectomy    . Axillary hidradenitis excision    . Hernia repair      multiple  .  Tonsilectomy, adenoidectomy, bilateral myringotomy and tubes      Allergies  Allergen Reactions  . Cefuroxime Axetil Hives  . Sulfa Antibiotics Hives    Social History   Social History  . Marital Status: Single    Spouse Name: N/A  . Number of Children: N/A  . Years of Education: N/A   Occupational History  . disabled    Social History Main Topics  . Smoking status: Never Smoker   . Smokeless tobacco: Never Used  . Alcohol Use: No  . Drug Use: No  . Sexual Activity: Not on file   Other Topics Concern  . Not on file   Social History Narrative    No family history on file.  BP 119/72 mmHg  Ht 5' 4"  (1.626 m)  Wt 250 lb (113.399 kg)  BMI 42.89 kg/m2  Review of  Systems: See HPI above.    Objective:  Physical Exam:  Gen: NAD  Left foot/ankle: No gross deformity, swelling, ecchymoses Transverse arch collapse with callus over all MT heads. FROM ankle without pain, 5/5 strength. TTP 2nd metatarsal plantar surface. Negative ant drawer and talar tilt.   Negative syndesmotic compression. Mild pain metatarsal squeeze. Thompsons test negative. NV intact distally.  Right foot/ankle: FROM without pain.   Assessment & Plan:  1. Left foot pain - consistent with metatarsalgia.  Still having trouble with this despite trial of metatarsal pads, custom orthotics, cutout made for 2nd MT head to drop in orthotic.  Decided to add small amount of blue cushion to act as a metatarsal pad to see if she tolerates this better.  Discussed only other consideration would be cushion directly under MT head which should increase pain though.  She will follow up as needed.  Total visit time 15 minutes of which half was spent on counseling, answering questions, discussing treatment options.

## 2015-02-06 ENCOUNTER — Ambulatory Visit (INDEPENDENT_AMBULATORY_CARE_PROVIDER_SITE_OTHER): Payer: Medicare Other | Admitting: Physician Assistant

## 2015-02-06 ENCOUNTER — Ambulatory Visit (INDEPENDENT_AMBULATORY_CARE_PROVIDER_SITE_OTHER): Payer: Medicare Other

## 2015-02-06 VITALS — BP 121/64 | HR 77 | Temp 98.2°F | Resp 17 | Ht 63.5 in | Wt 249.5 lb

## 2015-02-06 DIAGNOSIS — R059 Cough, unspecified: Secondary | ICD-10-CM

## 2015-02-06 DIAGNOSIS — R05 Cough: Secondary | ICD-10-CM

## 2015-02-06 DIAGNOSIS — R0789 Other chest pain: Secondary | ICD-10-CM | POA: Diagnosis not present

## 2015-02-06 LAB — POCT CBC
GRANULOCYTE PERCENT: 59.8 % (ref 37–80)
HCT, POC: 43 % (ref 37.7–47.9)
HEMOGLOBIN: 14.1 g/dL (ref 12.2–16.2)
LYMPH, POC: 3.2 (ref 0.6–3.4)
MCH: 29.3 pg (ref 27–31.2)
MCHC: 32.8 g/dL (ref 31.8–35.4)
MCV: 89.2 fL (ref 80–97)
MID (cbc): 0.6 (ref 0–0.9)
MPV: 7.3 fL (ref 0–99.8)
POC GRANULOCYTE: 5.6 (ref 2–6.9)
POC LYMPH PERCENT: 33.7 %L (ref 10–50)
POC MID %: 6.5 % (ref 0–12)
Platelet Count, POC: 281 10*3/uL (ref 142–424)
RBC: 4.82 M/uL (ref 4.04–5.48)
RDW, POC: 15.8 %
WBC: 9.4 10*3/uL (ref 4.6–10.2)

## 2015-02-06 MED ORDER — ACETAMINOPHEN 500 MG PO TABS: 1000.0000 mg | ORAL_TABLET | Freq: Three times a day (TID) | ORAL | Status: DC | PRN

## 2015-02-06 MED ORDER — AZITHROMYCIN 250 MG PO TABS: ORAL_TABLET | ORAL | Status: DC

## 2015-02-06 MED ORDER — HYDROCODONE-HOMATROPINE 5-1.5 MG/5ML PO SYRP: 2.5000 mL | ORAL_SOLUTION | Freq: Every evening | ORAL | Status: DC | PRN

## 2015-02-06 NOTE — Progress Notes (Signed)
02/07/2015 12:56 PM   DOB: 1981-01-08 / MRN: [id number]  SUBJECTIVE:  Wanda Oneill is a 35 y.o. female presenting for 3 weeks of cough.  This started with mild sore throat and rhinorrhea, however this symptoms resolved after roughly 1 week and the cough lingered. Associates left sided chest pain that is worse with breathing and movement.  She is a never smoker. She is taking birth control.  She denies fever, chills, nausea, diaphoresis.  She has a history of pneumonia x 2 with hospitalization.  Her mother is with her today and would feel reassured with a chest radiograph. She has a history of GERD and does not tolerate NSAIDs.    Depression screen PHQ 2/9 02/06/2015  Decreased Interest 0  Down, Depressed, Hopeless 0  PHQ - 2 Score 0      She is allergic to cefuroxime axetil and sulfa antibiotics.   She  has a past medical history of Diabetes mellitus; Thyroid disease; Down syndrome; Hidradenitis; Tendonitis; Cancer ([place]); Periumbilical hernia; and Gastritis.    She  reports that she has never smoked. She has never used smokeless tobacco. She reports that she does not drink alcohol or use illicit drugs. She  has no sexual activity history on file. The patient  has past surgical history that includes Cholecystectomy; Axillary hidradenitis excision; Hernia repair; and Tonsilectomy, adenoidectomy, bilateral myringotomy and tubes.  Her family history is not on file.  Review of Systems  Constitutional: Negative for fever.  Respiratory: Positive for cough. Negative for hemoptysis and shortness of breath.   Cardiovascular: Positive for chest pain. Negative for palpitations and leg swelling.  Skin: Negative for rash.  Neurological: Negative for dizziness and headaches.  Psychiatric/Behavioral: Negative for depression.    Problem list and medications reviewed and updated by myself where necessary, and exist elsewhere in the encounter.   OBJECTIVE:  BP 121/64 mmHg  Pulse 77  Temp(Src) 98.2  F (36.8 C) (Oral)  Resp 17  Ht 5' 3.5" (1.613 m)  Wt 249 lb 8 oz (113.172 kg)  BMI 43.50 kg/m2  SpO2 98%  Physical Exam  Constitutional: She is oriented to person, place, and time. She appears well-developed.  HENT:  Right Ear: External ear normal.  Left Ear: External ear normal.  Nose: Nose normal.  Mouth/Throat: Oropharynx is clear and moist.  Cardiovascular: Normal rate and regular rhythm.   Pulmonary/Chest: Effort normal and breath sounds normal. She has no decreased breath sounds. She has no wheezes. She has no rhonchi. She has no rales. She exhibits no edema.    Abdominal: Soft.  Neurological: She is alert and oriented to person, place, and time. No cranial nerve deficit.  Skin: Skin is warm and dry. She is not diaphoretic.  Psychiatric: She has a normal mood and affect.  Nursing note and vitals reviewed.  [facility] reading (PRIMARY) by  PA Amarylis Rovito: STAT read please comment.   Results for orders placed or performed in visit on 02/06/15 (from the past 48 hour(s))  POCT CBC     Status: None   Collection Time: 02/06/15  7:40 PM  Result Value Ref Range   WBC 9.4 4.6 - 10.2 K/uL   Lymph, poc 3.2 0.6 - 3.4   POC LYMPH PERCENT 33.7 10 - 50 %L   MID (cbc) 0.6 0 - 0.9   POC MID % 6.5 0 - 12 %M   POC Granulocyte 5.6 2 - 6.9   Granulocyte percent 59.8 37 - 80 %G   RBC 4.82  4.04 - 5.48 M/uL   Hemoglobin 14.1 12.2 - 16.2 g/dL   HCT, POC 43.0 37.7 - 47.9 %   MCV 89.2 80 - 97 fL   MCH, POC 29.3 27 - 31.2 pg   MCHC 32.8 31.8 - 35.4 g/dL   RDW, POC 15.8 %   Platelet Count, POC 281 142 - 424 K/uL   MPV 7.3 0 - 99.8 fL    ASSESSMENT AND PLAN  Genece was seen today for cough and chest pain.  Diagnoses and all orders for this visit:  Cough: RADs, CBC, and PE negative for concerning pathology.  She has chest tenderness on exam, most likely secondary to post viral cough syndrome.  Advised conservative measures for now.  RTC if worsening of her symptoms change.  Abx prescribed to be  filled on the fifth if she is not getting better.   -     DG Chest 2 View; Future -     POCT CBC -     HYDROcodone-homatropine (HYCODAN) 5-1.5 MG/5ML syrup; Take 2.5-5 mLs by mouth at bedtime as needed.  Chest wall pain -     acetaminophen (TYLENOL) 500 MG tablet; Take 2 tablets (1,000 mg total) by mouth every 8 (eight) hours as needed for mild pain or moderate pain. Take 2 tabs every 8 hours.  Other orders -     azithromycin (ZITHROMAX) 250 MG tablet; Take 2 on day one, and 1 daily thereafter until finished.  Do no fill until 02/08/14.    The patient was advised to call or return to clinic if she does not see an improvement in symptoms or to seek the care of the closest emergency department if she worsens with the above plan.   Philis Fendt, MHS, PA-C [facility] 02/07/2015 12:56 PM

## 2015-03-23 ENCOUNTER — Other Ambulatory Visit: Payer: Self-pay | Admitting: General Surgery

## 2015-03-23 DIAGNOSIS — K439 Ventral hernia without obstruction or gangrene: Secondary | ICD-10-CM

## 2015-04-03 ENCOUNTER — Ambulatory Visit
Admission: RE | Admit: 2015-04-03 | Discharge: 2015-04-03 | Disposition: A | Discharge status: Home / Self Care | Payer: Medicare Other | Source: Ambulatory Visit | Attending: General Surgery | Admitting: General Surgery

## 2015-04-03 DIAGNOSIS — K439 Ventral hernia without obstruction or gangrene: Secondary | ICD-10-CM

## 2015-04-28 ENCOUNTER — Other Ambulatory Visit: Payer: Self-pay | Admitting: General Surgery

## 2015-05-23 ENCOUNTER — Encounter (HOSPITAL_COMMUNITY): Payer: Self-pay

## 2015-05-23 ENCOUNTER — Other Ambulatory Visit (HOSPITAL_COMMUNITY): Payer: Self-pay | Admitting: *Deleted

## 2015-05-23 ENCOUNTER — Encounter (HOSPITAL_COMMUNITY)
Admission: RE | Admit: 2015-05-23 | Discharge: 2015-05-23 | Disposition: A | Discharge status: Home / Self Care | Payer: Medicare Other | Source: Ambulatory Visit | Attending: General Surgery | Admitting: General Surgery

## 2015-05-23 DIAGNOSIS — K432 Incisional hernia without obstruction or gangrene: Secondary | ICD-10-CM | POA: Diagnosis not present

## 2015-05-23 DIAGNOSIS — E119 Type 2 diabetes mellitus without complications: Secondary | ICD-10-CM | POA: Insufficient documentation

## 2015-05-23 DIAGNOSIS — Z01818 Encounter for other preprocedural examination: Secondary | ICD-10-CM | POA: Insufficient documentation

## 2015-05-23 DIAGNOSIS — Z01812 Encounter for preprocedural laboratory examination: Secondary | ICD-10-CM | POA: Diagnosis not present

## 2015-05-23 HISTORY — DX: Family history of other specified conditions: Z84.89

## 2015-05-23 HISTORY — DX: Hypothyroidism, unspecified: E03.9

## 2015-05-23 HISTORY — DX: Headache, unspecified: R51.9

## 2015-05-23 HISTORY — DX: Gastro-esophageal reflux disease without esophagitis: K21.9

## 2015-05-23 HISTORY — DX: Irritable bowel syndrome, unspecified: K58.9

## 2015-05-23 HISTORY — DX: Headache: R51

## 2015-05-23 HISTORY — DX: Pneumonia, unspecified organism: J18.9

## 2015-05-23 HISTORY — DX: Restless legs syndrome: G25.81

## 2015-05-23 LAB — CBC WITH DIFFERENTIAL/PLATELET
BASOS ABS: 0.1 10*3/uL (ref 0.0–0.1)
BASOS PCT: 1 %
Eosinophils Absolute: 0 10*3/uL (ref 0.0–0.7)
Eosinophils Relative: 0 %
HEMATOCRIT: 43.9 % (ref 36.0–46.0)
HEMOGLOBIN: 14 g/dL (ref 12.0–15.0)
LYMPHS PCT: 39 %
Lymphs Abs: 3.5 10*3/uL (ref 0.7–4.0)
MCH: 29.3 pg (ref 26.0–34.0)
MCHC: 31.9 g/dL (ref 30.0–36.0)
MCV: 91.8 fL (ref 78.0–100.0)
MONO ABS: 0.6 10*3/uL (ref 0.1–1.0)
Monocytes Relative: 7 %
NEUTROS ABS: 4.7 10*3/uL (ref 1.7–7.7)
NEUTROS PCT: 53 %
Platelets: 265 10*3/uL (ref 150–400)
RBC: 4.78 MIL/uL (ref 3.87–5.11)
RDW: 15.2 % (ref 11.5–15.5)
WBC: 9 10*3/uL (ref 4.0–10.5)

## 2015-05-23 LAB — HCG, SERUM, QUALITATIVE: Preg, Serum: NEGATIVE

## 2015-05-23 LAB — BASIC METABOLIC PANEL
ANION GAP: 11 (ref 5–15)
BUN: 19 mg/dL (ref 6–20)
CO2: 26 mmol/L (ref 22–32)
Calcium: 9.3 mg/dL (ref 8.9–10.3)
Chloride: 100 mmol/L — ABNORMAL LOW (ref 101–111)
Creatinine, Ser: 0.78 mg/dL (ref 0.44–1.00)
GLUCOSE: 213 mg/dL — AB (ref 65–99)
POTASSIUM: 4.3 mmol/L (ref 3.5–5.1)
Sodium: 137 mmol/L (ref 135–145)

## 2015-05-23 LAB — GLUCOSE, CAPILLARY: Glucose-Capillary: 233 mg/dL — ABNORMAL HIGH (ref 65–99)

## 2015-05-23 NOTE — Pre-Procedure Instructions (Addendum)
Wanda Oneill  05/23/2015     Your procedure is scheduled on Tuesday, May 30, 2015 at 10:35 AM.   Report to [facility] Entrance "A" Admitting Office at 8:30 AM.   Call this number if you have problems the morning of surgery: [phone]   Any questions prior to day of surgery, please call [phone] between 8 & 4 PM.   Remember:  Do not eat food or drink liquids after midnight Monday, 05/29/15.  Take these medicines the morning of surgery with A SIP OF WATER: Allopurinol (Zyloprim), Famotidine (Pepcid), birth control pill, Levothyroxine (Synthroid), Tramadol or Tylenol - if needed.  Stop NSAIDS (Naproxen, Aleve, Ibuprofen, Motrin, etc) as of today. Do not use Aspirin products prior to surgery as of today.   How to Manage Your Diabetes Before Surgery   Why is it important to control my blood sugar before and after surgery?   Improving blood sugar levels before and after surgery helps healing and can limit problems.  A way of improving blood sugar control is eating a healthy diet by:  - Eating less sugar and carbohydrates  - Increasing activity/exercise  - Talk with your doctor about reaching your blood sugar goals  High blood sugars (greater than 180 mg/dL) can raise your risk of infections and slow down your recovery so you will need to focus on controlling your diabetes during the weeks before surgery.  Make sure that the doctor who takes care of your diabetes knows about your planned surgery including the date and location.  How do I manage my blood sugars before surgery?   Check your blood sugar at least 4 times a day, 2 days before surgery to make sure that they are not too high or low.  Check your blood sugar the morning of your surgery when you wake up and every 2 hours until you get to the Short-Stay unit.  Treat a low blood sugar (less than 70 mg/dL) with 1/2 cup of clear juice (cranberry or apple), 4 glucose tablets, OR glucose gel.  Recheck blood  sugar in 15 minutes after treatment (to make sure it is greater than 70 mg/dL).  If blood sugar is not greater than 70 mg/dL on re-check, call [phone] for further instructions.   Report your blood sugar to the Short-Stay nurse when you get to Short-Stay.  References:  [facility], 2007 "How to Manage your Diabetes Before and After Surgery".  What do I do about my diabetes medications?   Do not take oral diabetes medicines (pills) the morning of surgery.   Do not wear jewelry, make-up or nail polish.  Do not wear lotions, powders, or perfumes.  You may wear deodorant.  Do not shave 48 hours prior to surgery.    Do not bring valuables to the hospital.  [facility] is not responsible for any belongings or valuables.  Contacts, dentures or bridgework may not be worn into surgery.  Leave your suitcase in the car.  After surgery it may be brought to your room.  For patients admitted to the hospital, discharge time will be determined by your treatment team.  Special instructions:  [place] - Preparing for Surgery  Before surgery, you can play an important role.  Because skin is not sterile, your skin needs to be as free of germs as possible.  You can reduce the number of germs on you skin by washing with CHG (chlorahexidine gluconate) soap before surgery.  CHG is  an antiseptic cleaner which kills germs and bonds with the skin to continue killing germs even after washing.  Please DO NOT use if you have an allergy to CHG or antibacterial soaps.  If your skin becomes reddened/irritated stop using the CHG and inform your nurse when you arrive at Short Stay.  Do not shave (including legs and underarms) for at least 48 hours prior to the first CHG shower.  You may shave your face.  Please follow these instructions carefully:   1.  Shower with CHG Soap the night before surgery and the                                morning of Surgery.  2.  If you choose to wash  your hair, wash your hair first as usual with your       normal shampoo.  3.  After you shampoo, rinse your hair and body thoroughly to remove the                      Shampoo.  4.  Use CHG as you would any other liquid soap.  You can apply chg directly       to the skin and wash gently with scrungie or a clean washcloth.  5.  Apply the CHG Soap to your body ONLY FROM THE NECK DOWN.        Do not use on open wounds or open sores.  Avoid contact with your eyes, ears, mouth and genitals (private parts).  Wash genitals (private parts) with your normal soap.  6.  Wash thoroughly, paying special attention to the area where your surgery        will be performed.  7.  Thoroughly rinse your body with warm water from the neck down.  8.  DO NOT shower/wash with your normal soap after using and rinsing off       the CHG Soap.  9.  Pat yourself dry with a clean towel.            10.  Wear clean pajamas.            11.  Place clean sheets on your bed the night of your first shower and do not        sleep with pets.  Day of Surgery  Do not apply any lotions the morning of surgery.  Please wear clean clothes to the hospital.   Please read over the following fact sheets that you were given. Pain Booklet, Coughing and Deep Breathing and Surgical Site Infection Prevention

## 2015-05-23 NOTE — Progress Notes (Signed)
Pt has Down's syndrome, but is independent and lives on her own. Mom is here with her for her PAT appt. Pt and mom denies cardiac history, chest pain or sob. Pt is diabetic, last A1C was 8.0 on 04/25/15. Today's cbg was 233 (has eaten lunch). She states normally her fasting blood sugar is between 125-145.

## 2015-05-29 MED ORDER — VANCOMYCIN HCL 10 G IV SOLR
1500.0000 mg | INTRAVENOUS | Status: AC
  Administered 2015-05-30: 1000 mg via INTRAVENOUS
  Filled 2015-05-29: qty 1500

## 2015-05-30 ENCOUNTER — Encounter (HOSPITAL_COMMUNITY): Payer: Self-pay | Admitting: General Practice

## 2015-05-30 ENCOUNTER — Ambulatory Visit (HOSPITAL_COMMUNITY): Payer: Medicare Other | Admitting: Anesthesiology

## 2015-05-30 ENCOUNTER — Encounter (HOSPITAL_COMMUNITY)
Admission: RE | Disposition: A | Discharge status: Home / Self Care | Payer: Self-pay | Source: Ambulatory Visit | Attending: General Surgery

## 2015-05-30 ENCOUNTER — Observation Stay (HOSPITAL_COMMUNITY)
Admission: RE | Admit: 2015-05-30 | Discharge: 2015-05-31 | Disposition: A | Discharge status: Home / Self Care | Payer: Medicare Other | Source: Ambulatory Visit | Attending: General Surgery | Admitting: General Surgery

## 2015-05-30 DIAGNOSIS — E669 Obesity, unspecified: Secondary | ICD-10-CM | POA: Insufficient documentation

## 2015-05-30 DIAGNOSIS — Q909 Down syndrome, unspecified: Secondary | ICD-10-CM | POA: Diagnosis not present

## 2015-05-30 DIAGNOSIS — E039 Hypothyroidism, unspecified: Secondary | ICD-10-CM | POA: Diagnosis not present

## 2015-05-30 DIAGNOSIS — K436 Other and unspecified ventral hernia with obstruction, without gangrene: Secondary | ICD-10-CM | POA: Diagnosis present

## 2015-05-30 DIAGNOSIS — E119 Type 2 diabetes mellitus without complications: Secondary | ICD-10-CM | POA: Insufficient documentation

## 2015-05-30 DIAGNOSIS — K430 Incisional hernia with obstruction, without gangrene: Secondary | ICD-10-CM | POA: Diagnosis present

## 2015-05-30 DIAGNOSIS — K219 Gastro-esophageal reflux disease without esophagitis: Secondary | ICD-10-CM | POA: Insufficient documentation

## 2015-05-30 HISTORY — PX: INCISIONAL HERNIA REPAIR: SHX193

## 2015-05-30 HISTORY — PX: INSERTION OF MESH: SHX5868

## 2015-05-30 HISTORY — PX: LAPAROSCOPY: SHX197

## 2015-05-30 LAB — GLUCOSE, CAPILLARY
GLUCOSE-CAPILLARY: 144 mg/dL — AB (ref 65–99)
Glucose-Capillary: 181 mg/dL — ABNORMAL HIGH (ref 65–99)
Glucose-Capillary: 246 mg/dL — ABNORMAL HIGH (ref 65–99)
Glucose-Capillary: 206 mg/dL — ABNORMAL HIGH (ref 65–99)

## 2015-05-30 LAB — HEMOGLOBIN AND HEMATOCRIT, BLOOD
HCT: 33 % — ABNORMAL LOW (ref 36.0–46.0)
Hemoglobin: 10.3 g/dL — ABNORMAL LOW (ref 12.0–15.0)

## 2015-05-30 LAB — MRSA PCR SCREENING: MRSA BY PCR: NEGATIVE

## 2015-05-30 SURGERY — LAPAROSCOPY, DIAGNOSTIC
Anesthesia: General | Site: Abdomen

## 2015-05-30 MED ORDER — ACETAMINOPHEN 325 MG PO TABS
650.0000 mg | ORAL_TABLET | Freq: Four times a day (QID) | ORAL | Status: AC
  Administered 2015-05-30 – 2015-05-31 (×4): 650 mg via ORAL
  Filled 2015-05-30 (×4): qty 2

## 2015-05-30 MED ORDER — LORATADINE 10 MG PO TABS
10.0000 mg | ORAL_TABLET | Freq: Every day | ORAL | Status: DC
  Administered 2015-05-30: 10 mg via ORAL
  Filled 2015-05-30: qty 1

## 2015-05-30 MED ORDER — PROPOFOL 10 MG/ML IV BOLUS
INTRAVENOUS | Status: AC
  Filled 2015-05-30: qty 20

## 2015-05-30 MED ORDER — LEVOTHYROXINE SODIUM 25 MCG PO TABS
137.0000 ug | ORAL_TABLET | Freq: Every day | ORAL | Status: DC
  Administered 2015-05-31: 137 ug via ORAL
  Filled 2015-05-30: qty 1

## 2015-05-30 MED ORDER — SODIUM CHLORIDE 0.9 % IV SOLN
INTRAVENOUS | Status: DC
  Administered 2015-05-30 – 2015-05-31 (×2): via INTRAVENOUS

## 2015-05-30 MED ORDER — KETOROLAC TROMETHAMINE 15 MG/ML IJ SOLN
INTRAMUSCULAR | Status: AC
  Filled 2015-05-30: qty 1

## 2015-05-30 MED ORDER — PHENYLEPHRINE HCL 10 MG/ML IJ SOLN: 10.0000 mg | INTRAVENOUS | Status: DC | PRN

## 2015-05-30 MED ORDER — METFORMIN HCL ER 500 MG PO TB24
500.0000 mg | ORAL_TABLET | Freq: Every day | ORAL | Status: DC
  Filled 2015-05-30: qty 1

## 2015-05-30 MED ORDER — ONDANSETRON 4 MG PO TBDP
4.0000 mg | ORAL_TABLET | Freq: Four times a day (QID) | ORAL | Status: DC | PRN
  Filled 2015-05-30: qty 1

## 2015-05-30 MED ORDER — ONDANSETRON HCL 4 MG/2ML IJ SOLN
4.0000 mg | Freq: Four times a day (QID) | INTRAMUSCULAR | Status: DC | PRN
  Administered 2015-05-30 – 2015-05-31 (×2): 4 mg via INTRAVENOUS
  Filled 2015-05-30 (×2): qty 2

## 2015-05-30 MED ORDER — NEOSTIGMINE METHYLSULFATE 10 MG/10ML IV SOLN
INTRAVENOUS | Status: DC | PRN
  Administered 2015-05-30: 4 mg via INTRAVENOUS

## 2015-05-30 MED ORDER — TRAMADOL HCL 50 MG PO TABS: 50.0000 mg | ORAL_TABLET | Freq: Four times a day (QID) | ORAL | Status: DC | PRN

## 2015-05-30 MED ORDER — SODIUM CHLORIDE 0.9 % IV SOLN
Freq: Once | INTRAVENOUS | Status: AC
  Administered 2015-05-30: 16:00:00 via INTRAVENOUS

## 2015-05-30 MED ORDER — METOCLOPRAMIDE HCL 5 MG/ML IJ SOLN
INTRAMUSCULAR | Status: AC
  Filled 2015-05-30: qty 2

## 2015-05-30 MED ORDER — PROMETHAZINE HCL 25 MG/ML IJ SOLN
INTRAMUSCULAR | Status: AC
  Filled 2015-05-30: qty 1

## 2015-05-30 MED ORDER — ROCURONIUM BROMIDE 100 MG/10ML IV SOLN
INTRAVENOUS | Status: DC | PRN
  Administered 2015-05-30: 20 mg via INTRAVENOUS
  Administered 2015-05-30 (×4): 10 mg via INTRAVENOUS

## 2015-05-30 MED ORDER — LACTATED RINGERS IV SOLN
INTRAVENOUS | Status: DC
  Administered 2015-05-30 (×2): via INTRAVENOUS

## 2015-05-30 MED ORDER — FENTANYL CITRATE (PF) 250 MCG/5ML IJ SOLN
INTRAMUSCULAR | Status: AC
  Filled 2015-05-30: qty 5

## 2015-05-30 MED ORDER — MIDAZOLAM HCL 5 MG/5ML IJ SOLN
INTRAMUSCULAR | Status: DC | PRN
  Administered 2015-05-30: 1 mg via INTRAVENOUS

## 2015-05-30 MED ORDER — FENTANYL CITRATE (PF) 100 MCG/2ML IJ SOLN
25.0000 ug | INTRAMUSCULAR | Status: DC | PRN
  Administered 2015-05-30 (×3): 50 ug via INTRAVENOUS

## 2015-05-30 MED ORDER — ENOXAPARIN SODIUM 40 MG/0.4ML ~~LOC~~ SOLN
40.0000 mg | SUBCUTANEOUS | Status: DC
  Administered 2015-05-31: 40 mg via SUBCUTANEOUS
  Filled 2015-05-30: qty 0.4

## 2015-05-30 MED ORDER — CANAGLIFLOZIN 100 MG PO TABS
100.0000 mg | ORAL_TABLET | Freq: Every day | ORAL | Status: DC
  Administered 2015-05-31: 100 mg via ORAL
  Filled 2015-05-30: qty 1

## 2015-05-30 MED ORDER — DEXAMETHASONE SODIUM PHOSPHATE 4 MG/ML IJ SOLN
INTRAMUSCULAR | Status: DC | PRN
  Administered 2015-05-30: 4 mg via INTRAVENOUS

## 2015-05-30 MED ORDER — GLYCOPYRROLATE 0.2 MG/ML IJ SOLN
INTRAMUSCULAR | Status: DC | PRN
  Administered 2015-05-30: 0.6 mg via INTRAVENOUS

## 2015-05-30 MED ORDER — BUPIVACAINE-EPINEPHRINE (PF) 0.25% -1:200000 IJ SOLN
INTRAMUSCULAR | Status: AC
  Filled 2015-05-30: qty 30

## 2015-05-30 MED ORDER — MORPHINE SULFATE (PF) 2 MG/ML IV SOLN
1.0000 mg | INTRAVENOUS | Status: DC | PRN
Start: 2015-05-30 — End: 2015-05-31
  Filled 2015-05-30: qty 1

## 2015-05-30 MED ORDER — METOCLOPRAMIDE HCL 5 MG/ML IJ SOLN
10.0000 mg | Freq: Once | INTRAMUSCULAR | Status: AC | PRN
  Administered 2015-05-30: 10 mg via INTRAVENOUS

## 2015-05-30 MED ORDER — MIDAZOLAM HCL 2 MG/2ML IJ SOLN
INTRAMUSCULAR | Status: AC
  Filled 2015-05-30: qty 2

## 2015-05-30 MED ORDER — DOCUSATE SODIUM 100 MG PO CAPS
100.0000 mg | ORAL_CAPSULE | Freq: Two times a day (BID) | ORAL | Status: DC
  Administered 2015-05-30: 100 mg via ORAL
  Filled 2015-05-30: qty 1

## 2015-05-30 MED ORDER — CANAGLIFLOZIN-METFORMIN HCL 150-500 MG PO TABS: 1.0000 | ORAL_TABLET | Freq: Every day | ORAL | Status: DC

## 2015-05-30 MED ORDER — EPHEDRINE SULFATE 50 MG/ML IJ SOLN
INTRAMUSCULAR | Status: DC | PRN
  Administered 2015-05-30: 5 mg via INTRAVENOUS

## 2015-05-30 MED ORDER — ALLOPURINOL 300 MG PO TABS
300.0000 mg | ORAL_TABLET | Freq: Every day | ORAL | Status: DC
Start: 2015-05-31 — End: 2015-05-31
  Administered 2015-05-31: 300 mg via ORAL
  Filled 2015-05-30: qty 1

## 2015-05-30 MED ORDER — FENTANYL CITRATE (PF) 100 MCG/2ML IJ SOLN
INTRAMUSCULAR | Status: AC
  Filled 2015-05-30: qty 2

## 2015-05-30 MED ORDER — 0.9 % SODIUM CHLORIDE (POUR BTL) OPTIME
TOPICAL | Status: DC | PRN
  Administered 2015-05-30: 1000 mL

## 2015-05-30 MED ORDER — ONDANSETRON HCL 4 MG/2ML IJ SOLN
INTRAMUSCULAR | Status: DC | PRN
  Administered 2015-05-30: 4 mg via INTRAVENOUS

## 2015-05-30 MED ORDER — BACLOFEN 10 MG PO TABS: 10.0000 mg | ORAL_TABLET | Freq: Every day | ORAL | Status: DC | PRN

## 2015-05-30 MED ORDER — LIDOCAINE HCL (CARDIAC) 20 MG/ML IV SOLN
INTRAVENOUS | Status: DC | PRN
  Administered 2015-05-30: 100 mg via INTRAVENOUS

## 2015-05-30 MED ORDER — FENTANYL CITRATE (PF) 100 MCG/2ML IJ SOLN
INTRAMUSCULAR | Status: DC | PRN
  Administered 2015-05-30 (×2): 50 ug via INTRAVENOUS
  Administered 2015-05-30 (×6): 25 ug via INTRAVENOUS

## 2015-05-30 MED ORDER — PANTOPRAZOLE SODIUM 40 MG PO TBEC
40.0000 mg | DELAYED_RELEASE_TABLET | Freq: Every day | ORAL | Status: DC
Start: 2015-05-30 — End: 2015-05-31
  Administered 2015-05-30: 40 mg via ORAL
  Filled 2015-05-30: qty 1

## 2015-05-30 MED ORDER — NORGESTIMATE-ETH ESTRADIOL 0.25-35 MG-MCG PO TABS
1.0000 | ORAL_TABLET | Freq: Every day | ORAL | Status: DC
  Administered 2015-05-31: 1 via ORAL

## 2015-05-30 MED ORDER — BUPIVACAINE-EPINEPHRINE 0.25% -1:200000 IJ SOLN
INTRAMUSCULAR | Status: DC | PRN
  Administered 2015-05-30: 8 mL

## 2015-05-30 MED ORDER — SIMETHICONE 80 MG PO CHEW: 40.0000 mg | CHEWABLE_TABLET | Freq: Four times a day (QID) | ORAL | Status: DC | PRN

## 2015-05-30 MED ORDER — PROPOFOL 10 MG/ML IV BOLUS
INTRAVENOUS | Status: DC | PRN
  Administered 2015-05-30: 180 mg via INTRAVENOUS
  Administered 2015-05-30 (×2): 10 mg via INTRAVENOUS
  Administered 2015-05-30: 20 mg via INTRAVENOUS

## 2015-05-30 MED ORDER — MEPERIDINE HCL 25 MG/ML IJ SOLN: 6.2500 mg | INTRAMUSCULAR | Status: DC | PRN

## 2015-05-30 MED ORDER — FAMOTIDINE 20 MG PO TABS
20.0000 mg | ORAL_TABLET | Freq: Every day | ORAL | Status: DC
  Administered 2015-05-30: 20 mg via ORAL
  Filled 2015-05-30: qty 1

## 2015-05-30 MED ORDER — KETOROLAC TROMETHAMINE 15 MG/ML IJ SOLN
15.0000 mg | Freq: Three times a day (TID) | INTRAMUSCULAR | Status: DC
  Administered 2015-05-30 – 2015-05-31 (×3): 15 mg via INTRAVENOUS
  Filled 2015-05-30 (×2): qty 1

## 2015-05-30 MED ORDER — VANCOMYCIN HCL IN DEXTROSE 1-5 GM/200ML-% IV SOLN
INTRAVENOUS | Status: AC
  Filled 2015-05-30: qty 200

## 2015-05-30 MED ORDER — INSULIN ASPART 100 UNIT/ML ~~LOC~~ SOLN
0.0000 [IU] | Freq: Three times a day (TID) | SUBCUTANEOUS | Status: DC
  Administered 2015-05-30: 5 [IU] via SUBCUTANEOUS
  Administered 2015-05-31: 3 [IU] via SUBCUTANEOUS

## 2015-05-30 MED ORDER — SUCCINYLCHOLINE CHLORIDE 20 MG/ML IJ SOLN
INTRAMUSCULAR | Status: DC | PRN
  Administered 2015-05-30: 120 mg via INTRAVENOUS

## 2015-05-30 MED ORDER — PHENYLEPHRINE HCL 10 MG/ML IJ SOLN
INTRAMUSCULAR | Status: DC | PRN
  Administered 2015-05-30: 40 ug via INTRAVENOUS
  Administered 2015-05-30: 120 ug via INTRAVENOUS

## 2015-05-30 SURGICAL SUPPLY — 45 items
APPLIER CLIP 5 13 M/L LIGAMAX5 (MISCELLANEOUS) ×2
CANISTER SUCTION 2500CC (MISCELLANEOUS) IMPLANT
CHLORAPREP W/TINT 26ML (MISCELLANEOUS) ×2 IMPLANT
CLIP APPLIE 5 13 M/L LIGAMAX5 (MISCELLANEOUS) ×1 IMPLANT
COVER SURGICAL LIGHT HANDLE (MISCELLANEOUS) ×2 IMPLANT
DEVICE SECURE STRAP 25 ABSORB (INSTRUMENTS) ×2 IMPLANT
DRAPE LAPAROSCOPIC ABDOMINAL (DRAPES) ×2 IMPLANT
ELECT REM PT RETURN 9FT ADLT (ELECTROSURGICAL) ×2
ELECTRODE REM PT RTRN 9FT ADLT (ELECTROSURGICAL) ×1 IMPLANT
GLOVE BIO SURGEON STRL SZ7 (GLOVE) ×4 IMPLANT
GLOVE BIO SURGEON STRL SZ8 (GLOVE) ×2 IMPLANT
GLOVE BIOGEL PI IND STRL 6.5 (GLOVE) ×2 IMPLANT
GLOVE BIOGEL PI IND STRL 7.0 (GLOVE) ×1 IMPLANT
GLOVE BIOGEL PI IND STRL 7.5 (GLOVE) ×2 IMPLANT
GLOVE BIOGEL PI IND STRL 8.5 (GLOVE) ×1 IMPLANT
GLOVE BIOGEL PI INDICATOR 6.5 (GLOVE) ×2
GLOVE BIOGEL PI INDICATOR 7.0 (GLOVE) ×1
GLOVE BIOGEL PI INDICATOR 7.5 (GLOVE) ×2
GLOVE BIOGEL PI INDICATOR 8.5 (GLOVE) ×1
GLOVE ECLIPSE 7.5 STRL STRAW (GLOVE) ×2 IMPLANT
GOWN STRL REUS W/ TWL LRG LVL3 (GOWN DISPOSABLE) ×3 IMPLANT
GOWN STRL REUS W/TWL LRG LVL3 (GOWN DISPOSABLE) ×3
KIT BASIN OR (CUSTOM PROCEDURE TRAY) ×2 IMPLANT
KIT ROOM TURNOVER OR (KITS) ×2 IMPLANT
LIQUID BAND (GAUZE/BANDAGES/DRESSINGS) ×2 IMPLANT
MESH VENTRALEX ST 2.5 CRC MED (Mesh General) ×2 IMPLANT
NEEDLE SPNL 20GX3.5 QUINCKE YW (NEEDLE) ×2 IMPLANT
NS IRRIG 1000ML POUR BTL (IV SOLUTION) ×2 IMPLANT
PAD ARMBOARD 7.5X6 YLW CONV (MISCELLANEOUS) ×2 IMPLANT
SCISSORS LAP 5X35 DISP (ENDOMECHANICALS) IMPLANT
SET IRRIG TUBING LAPAROSCOPIC (IRRIGATION / IRRIGATOR) IMPLANT
SLEEVE ENDOPATH XCEL 5M (ENDOMECHANICALS) ×2 IMPLANT
STRIP CLOSURE SKIN 1/2X4 (GAUZE/BANDAGES/DRESSINGS) ×2 IMPLANT
SUT MNCRL AB 4-0 PS2 18 (SUTURE) ×2 IMPLANT
SUT PROLENE 0 CT 1 CR/8 (SUTURE) ×2 IMPLANT
SUT VIC AB 3-0 SH 27 (SUTURE) ×1
SUT VIC AB 3-0 SH 27X BRD (SUTURE) ×1 IMPLANT
TOWEL OR 17X24 6PK STRL BLUE (TOWEL DISPOSABLE) ×2 IMPLANT
TOWEL OR 17X26 10 PK STRL BLUE (TOWEL DISPOSABLE) ×2 IMPLANT
TRAY FOLEY CATH 14FRSI W/METER (CATHETERS) IMPLANT
TRAY LAPAROSCOPIC MC (CUSTOM PROCEDURE TRAY) ×2 IMPLANT
TROCAR XCEL BLUNT TIP 100MML (ENDOMECHANICALS) IMPLANT
TROCAR XCEL NON-BLD 11X100MML (ENDOMECHANICALS) IMPLANT
TROCAR XCEL NON-BLD 5MMX100MML (ENDOMECHANICALS) ×2 IMPLANT
TUBING INSUFFLATION (TUBING) ×2 IMPLANT

## 2015-05-30 NOTE — Anesthesia Procedure Notes (Signed)
Procedure Name: Intubation Date/Time: 05/30/2015 11:29 AM Performed by: Merdis Delay Pre-anesthesia Checklist: Patient identified, Timeout performed, Suction available, Patient being monitored and Emergency Drugs available Patient Re-evaluated:Patient Re-evaluated prior to inductionOxygen Delivery Method: Circle system utilized Preoxygenation: Pre-oxygenation with 100% oxygen Intubation Type: IV induction Ventilation: Mask ventilation without difficulty Tube size: 7.0 mm Number of attempts: 1 Airway Equipment and Method: Video-laryngoscopy Placement Confirmation: ETT inserted through vocal cords under direct vision,  breath sounds checked- equal and bilateral,  positive ETCO2 and CO2 detector Secured at: 22 cm Tube secured with: Tape Dental Injury: Teeth and Oropharynx as per pre-operative assessment  Difficulty Due To: Difficulty was unanticipated Comments: Patient easy mask ventilation. DL x1 with glidescope- delayed Succs onset--> glidescope blade removed and ventilation resumed. DL again with glidescope 3- grade 1 view.

## 2015-05-30 NOTE — Progress Notes (Signed)
Phenergan 6.25mg  iv ordered by dr Orene Desanctis epic will not allow me to put order in due to back order but we have it in our pyxis therefore 6.25mg  iv was given witness by phillip lopez rn

## 2015-05-30 NOTE — H&P (Signed)
Wanda Oneill is an 35 y.o. female.   Chief Complaint: abdominal bulge HPI: 56 yof with Downs syndrome with history of mulitple open and lap hernia repairs and lap chole presented with new epigastric pain especially with a bm. she is holding this area when she does this and has some tenderness when touched/hit there (she holds little kids). she had a lot of issues with my prior laparoscopic repair. I saw her recently and she sounded like there might be a hernia there. I sent her for a ct scan that shows a small fat containing hernia in the epigastrium about 6 mm in diameter. there are two other hernias but these are both covered by the mesh I placed previously. she returns today and her parents are here too    Past Medical History  Diagnosis Date  . Diabetes mellitus     type II  . Thyroid disease     hypothyroidism  . Down syndrome   . Hidradenitis   . Tendonitis     chronic in left foot  . Periumbilical hernia   . Gastritis   . Pneumonia   . Hypothyroidism   . GERD (gastroesophageal reflux disease)   . Headache   . Restless legs   . Irritable bowel syndrome   . Family history of adverse reaction to anesthesia     mom has had n/v    Past Surgical History  Procedure Laterality Date  . Cholecystectomy    . Axillary hidradenitis excision Bilateral   . Hernia repair      multiple  . Tonsilectomy, adenoidectomy, bilateral myringotomy and tubes      Family History  Problem Relation Age of Onset  . Thyroid cancer Mother   . Diabetes type II Mother   . High Cholesterol Mother   . Heart disease Mother   . Diabetes type II Father   . Hypertension Father   . Stroke Father    Social History:  reports that she has never smoked. She has never used smokeless tobacco. She reports that she does not drink alcohol or use illicit drugs.  Allergies:  Allergies  Allergen Reactions  . Cefuroxime Hives and Swelling    Unspecified location of swelling  . Cefuroxime Axetil Hives   . Sulfa Antibiotics Hives    Facility-administered medications prior to admission  Medication Dose Route Frequency Provider Last Rate Last Dose  . methylPREDNISolone acetate (DEPO-MEDROL) injection 80 mg  80 mg Intramuscular Once Wanda Culver, MD       Medications Prior to Admission  Medication Sig Dispense Refill  . acetaminophen (TYLENOL) 500 MG tablet Take 2 tablets (1,000 mg total) by mouth every 8 (eight) hours as needed for mild pain or moderate pain. Take 2 tabs every 8 hours. (Patient taking differently: Take 1,000 mg by mouth 2 (two) times daily as needed for mild pain or moderate pain. Take 2 tabs every 8 hours.) 30 tablet 0  . allopurinol (ZYLOPRIM) 300 MG tablet Take 300 mg by mouth daily.    Marland Kitchen augmented betamethasone dipropionate (DIPROLENE-AF) 0.05 % cream Apply topically 2 (two) times daily. (Patient taking differently: Apply 1 application topically daily as needed (for eczema). ) 50 g 0  . baclofen (LIORESAL) 10 MG tablet Take 10 mg by mouth daily as needed for muscle spasms.     . Blood Glucose Monitoring Suppl (BLOOD GLUCOSE METER KIT AND SUPPLIES) Dispense based on patient and insurance preference. To check blood sugars daily FOR ICD-9 250.00 1 each  0  . canagliflozin (INVOKANA) 300 MG TABS tablet Take 150 mg by mouth daily before breakfast.     . cetirizine (ZYRTEC) 10 MG tablet Take 10 mg by mouth daily as needed for allergies.     . clobetasol cream (TEMOVATE) 6.15 % Apply 1 application topically daily as needed (for eczema).     . dicyclomine (BENTYL) 10 MG capsule Take 2 capsules (20 mg total) by mouth 4 (four) times daily -  before meals and at bedtime. (Patient taking differently: Take 20 mg by mouth 2 (two) times daily as needed for spasms. ) 90 capsule 6  . famotidine (PEPCID) 20 MG tablet Take 20 mg by mouth daily.     . INVOKAMET 150-500 MG TABS Take 1 tablet by mouth daily. Once daily with dinner  1  . levothyroxine (SYNTHROID, LEVOTHROID) 137 MCG tablet  Take 1 tablet (137 mcg total) by mouth daily before breakfast. PATIENT NEEDS OFFICE VISIT/LABS FOR ADDITIONAL REFILLS 30 tablet 0  . naproxen sodium (ANAPROX) 220 MG tablet Take 220 mg by mouth daily as needed (for pain).     . norgestimate-ethinyl estradiol (ORTHO-CYCLEN,SPRINTEC,PREVIFEM) 0.25-35 MG-MCG tablet Take 1 tablet by mouth daily.    Marland Kitchen omeprazole (PRILOSEC) 20 MG capsule TAKE 1 CAPSULE (20 MG TOTAL) BY MOUTH DAILY. (Patient taking differently: TAKE 1 CAPSULE (20 MG TOTAL) BY MOUTH DAILY AS NEEDED for acid reflux) 90 capsule 0  . ONE TOUCH ULTRA TEST test strip USE TO CHECK BLOOD SUGAR ONCE DAILY (ICD CODE:25.00) 100 each 3  . sitaGLIPtin (JANUVIA) 100 MG tablet Take 1 tablet (100 mg total) by mouth daily. 90 tablet 3  . traMADol (ULTRAM) 50 MG tablet Take 1 tablet (50 mg total) by mouth every 6 (six) hours as needed. (Patient taking differently: Take 50 mg by mouth daily as needed for moderate pain. ) 30 tablet 5    Results for orders placed or performed during the hospital encounter of 05/30/15 (from the past 48 hour(s))  Glucose, capillary     Status: Abnormal   Collection Time: 05/30/15  8:49 AM  Result Value Ref Range   Glucose-Capillary 144 (H) 65 - 99 mg/dL   No results found.  ROS Negative  Blood pressure 123/72, pulse 72, temperature 97.8 F (36.6 C), resp. rate 20, weight 112.674 kg (248 lb 6.4 oz), SpO2 99 %. Physical Exam   Vitals (Wanda Oneill CMA; 04/28/2015 11:23 AM) 04/28/2015 11:23 AM Weight: 247 lb Height: 64in Body Surface Area: 2.14 m Body Mass Index: 42.4 kg/m  Temp.: 45F(Temporal)  Pulse: 73 (Regular)  BP: 130/76 (Sitting, Left Arm, Standard) Physical Exam Wanda Bookbinder MD; 04/28/2015 2:17 PM) General Mental Status-Alert. Orientation-Oriented X3. Chest and Lung Exam Chest and lung exam reveals -on auscultation, normal breath sounds, no adventitious sounds and normal vocal resonance. Cardiovascular Cardiovascular examination  reveals -normal heart sounds, regular rate and rhythm with no murmurs. Abdomen Note: healed incisions with small hernia at epigastrium likely, there are a couple other areas consistent with remaining sac/seroma but no ther hernias  Assessment/Plan INCISIONAL HERNIA (K43.2) Story: diagnostic laparoscopy likely open hernia with mesh I think reasonable to repair this given her symptoms . I would like to do this as simple as possible. ideally for recurrence and activity a better repair would be indicated but she did not fly through the last one. she went home on oxygen and I was concerned she would not survive it for a while. I will plan on inserting laparoscope, identifying defect and fixing likely  with a pvp. I discussed risks with she and her parents and we will proceed.  Wanda Bookbinder, MD 05/30/2015, 9:48 AM

## 2015-05-30 NOTE — Care Management Obs Status (Signed)
[facility] NOTIFICATION   Patient Details  Name: Wanda Oneill MRN: [id number] Date of Birth: 1980/12/25   Medicare Observation Status Notification Given:  Yes    Abeera Flannery T, RN 05/30/2015, 4:08 PM

## 2015-05-30 NOTE — Anesthesia Postprocedure Evaluation (Signed)
Anesthesia Post Note  Patient: Wanda Oneill  Procedure(s) Performed: Procedure(s) (LRB): LAPAROSCOPY DIAGNOSTIC (N/A) INSERTION OF MESH (N/A) LAPAROSCOPIC INCISIONAL HERNIA (N/A)  Patient location during evaluation: PACU Anesthesia Type: General Level of consciousness: awake and alert Pain management: pain level controlled Vital Signs Assessment: post-procedure vital signs reviewed and stable Respiratory status: spontaneous breathing, nonlabored ventilation, respiratory function stable and patient connected to nasal cannula oxygen Cardiovascular status: blood pressure returned to baseline and stable : some nausea , being treated. Anesthetic complications: no    Last Vitals:  Filed Vitals:   05/30/15 1315 05/30/15 1330  BP: 112/69 97/60  Pulse: 76 90  Temp:    Resp: 21 15    Last Pain:  Filed Vitals:   05/30/15 1335  PainSc: Asleep                 Sonnet Rizor,JAMES TERRILL

## 2015-05-30 NOTE — Op Note (Signed)
Preoperative diagnosis: incarcerated epigastric hernia, incisional Postoperative diagnosis: saa Procedure:diagnostic laparoscopy with lysis of adhesions, repair of 1 cm epigastric hernia with ventralex patch Surgeon: Dr Serita Grammes Anesthesia: general EBL: minimal Drains none Specimen none Complications: none Sponge count correct at completion Disposition to recovery stable  Indications: This is a 46 yof I know well who has Downs syndrome and multiple prior hernia repairs.  I did a laparoscopic repair in 2009.  She has epigastric pain and a ct scan that shows a small hernia at site of prior incision.  I discussed diagnostic laparoscopy with her and likely open repair with mesh.   Procedure: After informed consent was obtained the patient was taken to the operating room. She was given antibiotics. Sequential compression devices were on her legs. She was placed under general anesthesia without complication. Her abdomen was prepped and draped in the standard sterile surgical fashion. A surgical timeout was then performed.  I infiltrated marcaine in the left upper quadrant after the stomach was evacuated. I then made an incision and inserted a 5 mm optiview trocar without injury. The abdomen was insufflated to 15 mm Hg pressure. I then inserted two further five mm trocars in the left side of the abdomen. She had adhesions to the mesh I had placed previously. I took most of these down. There was no evidence of recurrent hernia at this site.  Her transverse colon was adherent to the mesh without a hernia and I did not lyse all these adhesions. I took the falciform ligament down with cautery. I was then able to identify a less than 1 cm defect with fat incarcerated in it.  I was able to reduce some of this. I elected to then make a transverse incision over this and inserted a 6 cm ventralex patch in this area. I closed the defect and secured the mesh ends with 0 prolene suture.  I then used the  securstrap tacker to put the mesh in apposition with the abdominal wall.  This was in good position and gave good overlap. I think this is best least invasive repair for her.  Hemostasis was observed. I did place clips on a small vessel near the hernia.  I then desufflated the abdomen and removed all trocars. I closed the transverse incision with 3-0 vicryl and 4-0 monocryl.  The remaining incision were closed with 4-0 monocryl. Glue and steristrips were applied to all of them.  She tolerated well, was extubated and transferred to recovery.   There was a small umbilical hernia as well as a 1 cm defect above the umbilicus. I took the falciform ligament down with cautery. I then removed the incarcerated preperitoneal fat from the hernia. I used an ecosac to remove this from the abdomen. I then placed four sutures of 0 prolene in cardinal positions around a 15 cm ventralight mesh. This was then inserted and rolled flat. I used the endoclose device to pull up these sutures to give at least 5 cm of overlap in all directions. I then used the reliatack tacker to secure the edges. The mesh was in good position and gave adequate coverage upon completion.  I then desufflated the abdomen and removed all my remaining trocars. I then closed these with 4-0 Monocryl and Dermabond. She tolerated this well was extubated and transferred to the recovery room in stable condition

## 2015-05-30 NOTE — Interval H&P Note (Signed)
History and Physical Interval Note:  05/30/2015 9:50 AM  Wanda Oneill  has presented today for surgery, with the diagnosis of INCISIONAL HERNIA  The various methods of treatment have been discussed with the patient and family. After consideration of risks, benefits and other options for treatment, the patient has consented to  Procedure(s): LAPAROSCOPY DIAGNOSTIC, OPEN INCISIONAL HERNIA REPAIR WITH MESH (N/A) INSERTION OF MESH (N/A) as a surgical intervention .  The patient's history has been reviewed, patient examined, no change in status, stable for surgery.  I have reviewed the patient's chart and labs.  Questions were answered to the patient's satisfaction.     [name]

## 2015-05-30 NOTE — Anesthesia Preprocedure Evaluation (Addendum)
Anesthesia Evaluation  Patient identified by MRN, date of birth, ID band Patient awake  General Assessment Comment:Pt c Down syn  History of Anesthesia Complications (+) Family history of anesthesia reaction  Airway Mallampati: III  TM Distance: <3 FB Neck ROM: Limited  Mouth opening: Limited Mouth Opening  Dental  (+) Teeth Intact, Dental Advisory Given   Pulmonary    breath sounds clear to auscultation       Cardiovascular negative cardio ROS   Rhythm:Regular Rate:Normal     Neuro/Psych  Headaches,    GI/Hepatic GERD  ,  Endo/Other  diabetes  Renal/GU      Musculoskeletal negative musculoskeletal ROS (+)   Abdominal (+) + obese,   Peds  Hematology   Anesthesia Other Findings   Reproductive/Obstetrics negative OB ROS                            Anesthesia Physical Anesthesia Plan  ASA: III  Anesthesia Plan: General   Post-op Pain Management:    Induction: Intravenous  Airway Management Planned: Oral ETT  Additional Equipment:   Intra-op Plan:   Post-operative Plan: Extubation in OR  Informed Consent:   Dental advisory given  Plan Discussed with: CRNA and Surgeon  Anesthesia Plan Comments:         Anesthesia Quick Evaluation

## 2015-05-30 NOTE — Transfer of Care (Signed)
Immediate Anesthesia Transfer of Care Note  Patient: Wanda Oneill  Procedure(s) Performed: Procedure(s): LAPAROSCOPY DIAGNOSTIC (N/A) INSERTION OF MESH (N/A) LAPAROSCOPIC INCISIONAL HERNIA (N/A)  Patient Location: PACU  Anesthesia Type:General  Level of Consciousness: awake, alert  and oriented  Airway & Oxygen Therapy: Patient Spontanous Breathing and Patient connected to face mask oxygen  Post-op Assessment: Report given to RN and Post -op Vital signs reviewed and stable  Post vital signs: Reviewed and stable  Last Vitals:  Filed Vitals:   05/30/15 0850  BP: 123/72  Pulse: 72  Temp: 36.6 C  Resp: 20    Complications: No apparent anesthesia complications

## 2015-05-30 NOTE — Anesthesia Postprocedure Evaluation (Signed)
Anesthesia Post Note  Patient: Wanda Oneill  Procedure(s) Performed: Procedure(s) (LRB): LAPAROSCOPY DIAGNOSTIC (N/A) INSERTION OF MESH (N/A) LAPAROSCOPIC INCISIONAL HERNIA (N/A)  Patient location during evaluation: PACU Anesthesia Type: General Level of consciousness: awake and alert Pain management: pain level controlled Vital Signs Assessment: post-procedure vital signs reviewed and stable Respiratory status: spontaneous breathing, nonlabored ventilation and respiratory function stable Cardiovascular status: blood pressure returned to baseline and stable Postop Assessment: no signs of nausea or vomiting Anesthetic complications: no            Ceylin Dreibelbis A.

## 2015-05-31 ENCOUNTER — Encounter (HOSPITAL_COMMUNITY): Payer: Self-pay | Admitting: General Surgery

## 2015-05-31 DIAGNOSIS — K430 Incisional hernia with obstruction, without gangrene: Secondary | ICD-10-CM | POA: Diagnosis not present

## 2015-05-31 LAB — BASIC METABOLIC PANEL
ANION GAP: 11 (ref 5–15)
BUN: 19 mg/dL (ref 6–20)
CALCIUM: 8 mg/dL — AB (ref 8.9–10.3)
CO2: 22 mmol/L (ref 22–32)
Chloride: 102 mmol/L (ref 101–111)
Creatinine, Ser: 1.02 mg/dL — ABNORMAL HIGH (ref 0.44–1.00)
Glucose, Bld: 182 mg/dL — ABNORMAL HIGH (ref 65–99)
Potassium: 4.3 mmol/L (ref 3.5–5.1)
Sodium: 135 mmol/L (ref 135–145)

## 2015-05-31 LAB — GLUCOSE, CAPILLARY: Glucose-Capillary: 176 mg/dL — ABNORMAL HIGH (ref 65–99)

## 2015-05-31 MED ORDER — TRAMADOL HCL 50 MG PO TABS: 50.0000 mg | ORAL_TABLET | Freq: Four times a day (QID) | ORAL | Status: DC | PRN

## 2015-05-31 MED ORDER — NAPROXEN 250 MG PO TABS: 250.0000 mg | ORAL_TABLET | Freq: Two times a day (BID) | ORAL | Status: DC

## 2015-05-31 NOTE — Progress Notes (Signed)
Pt doing well, has eaten a late solid breakfast (about 1100 am), walked in hall a few times and tolerated walking well, VSS, c/o only soreness to abdominal area at incisions. Pt and her mom feel ready for discharge. Call placed to Dr Donne Hazel at his office as he requested after pt had done the above activities and was tolerating well, and felt ready to go home.  Message left with office nurse, await return call from Dr Donne Hazel.

## 2015-05-31 NOTE — Progress Notes (Signed)
1 Day Post-Op  Subjective: Doing well, voiding, tol clears, mom thinks normal postop for her  Objective: Vital signs in last 24 hours: Temp:  [97 F (36.1 C)-99 F (37.2 C)] 99 F (37.2 C) (04/26 0423) Pulse Rate:  [64-104] 100 (04/26 0423) Resp:  [13-33] 23 (04/26 0100) BP: (90-142)/(48-106) 130/60 mmHg (04/26 0423) SpO2:  [86 %-100 %] 95 % (04/26 0423) Weight:  [112 kg (246 lb 14.6 oz)-112.674 kg (248 lb 6.4 oz)] 112 kg (246 lb 14.6 oz) (04/25 1700) Last BM Date: 05/30/15  Intake/Output from previous day: 04/25 0701 - 04/26 0700 In: 2209.3 [P.O.:600; I.V.:1609.3] Out: 85 [Urine:75; Blood:10] Intake/Output this shift:    GI: soft approp tender incisions clean  Lab Results:   Recent Labs  05/30/15 1551  HGB 10.3*  HCT 33.0*   BMET  Recent Labs  05/31/15 0413  NA 135  K 4.3  CL 102  CO2 22  GLUCOSE 182*  BUN 19  CREATININE 1.02*  CALCIUM 8.0*   PT/INR No results for input(s): LABPROT, INR in the last 72 hours. ABG No results for input(s): PHART, HCO3 in the last 72 hours.  Invalid input(s): PCO2, PO2  Studies/Results: No results found.  Anti-infectives: Anti-infectives    Start     Dose/Rate Route Frequency Ordered Stop   05/30/15 1000  vancomycin (VANCOCIN) 1,500 mg in sodium chloride 0.9 % 500 mL IVPB     1,500 mg 250 mL/hr over 120 Minutes Intravenous To ShortStay Surgical 05/29/15 1350 05/30/15 1215   05/30/15 0839  vancomycin (VANCOCIN) 1 GM/200ML IVPB    Comments:  Ancil Boozer  : cabinet override      05/30/15 0839 05/30/15 2044      Assessment/Plan: POD 1 dx lsc, epigastric hernia repair  Will get oob today, if does well can go home hct dilutional, has done well overnight      [facility] 05/31/2015

## 2015-05-31 NOTE — Discharge Instructions (Signed)
[facility] LAPAROSCOPIC SURGERY: POST OP INSTRUCTIONS  Always review your discharge instruction sheet given to you by the facility where your surgery was performed. IF YOU HAVE DISABILITY OR FAMILY LEAVE FORMS, YOU MUST BRING THEM TO THE OFFICE FOR PROCESSING.   DO NOT GIVE THEM TO YOUR DOCTOR.  1. A prescription for pain medication may be given to you upon discharge.  Take your pain medication as prescribed, if needed.  If narcotic pain medicine is not needed, then you may take acetaminophen (Tylenol), naprosyn (Alleve), or ibuprofen (Advil) as needed. 2. Take your usually prescribed medications unless otherwise directed. 3. If you need a refill on your pain medication, please contact your pharmacy.  They will contact our office to request authorization. Prescriptions will not be filled after 5pm or on week-ends. 4. You should follow a light diet the first few days after arrival home, such as soup and crackers, etc.  Be sure to include lots of fluids daily. 5. Most patients will experience some swelling and bruising in the area of the incisions.  Ice packs will help.  Swelling and bruising can take several days to resolve.  6. It is common to experience some constipation if taking pain medication after surgery.  Increasing fluid intake and taking a stool softener (such as Colace) will usually help or prevent this problem from occurring.  A mild laxative (Milk of Magnesia or Miralax) should be taken according to package instructions if there are no bowel movements after 48 hours. 7. Unless discharge instructions indicate otherwise, you may remove your bandages 48 hours after surgery, and you may shower at that time.  You may have steri-strips (small skin tapes) in place directly over the incision.  These strips should be left on the skin for 7-10 days.  If your surgeon used skin glue on the incision, you may shower in 24 hours.  The glue will flake  off over the next 2-3 weeks.  Any sutures or staples will be removed at the office during your follow-up visit. 8. ACTIVITIES:  You may resume regular (light) daily activities beginning the next day--such as daily self-care, walking, climbing stairs--gradually increasing activities as tolerated.  You may have sexual intercourse when it is comfortable.  Refrain from any heavy lifting or straining until approved by your doctor. a. You may drive when you are no longer taking prescription pain medication, you can comfortably wear a seatbelt, and you can safely maneuver your car and apply brakes. b. RETURN TO WORK:  __________________________________________________________ 9. You should see your doctor in the office for a follow-up appointment approximately 2-3 weeks after your surgery.  Make sure that you call for this appointment within a day or two after you arrive home to insure a convenient appointment time. 10. OTHER INSTRUCTIONS: __________________________________________________________________________________________________________________________ __________________________________________________________________________________________________________________________ WHEN TO CALL YOUR DOCTOR: 1. Fever over 101.0 2. Inability to urinate 3. Continued bleeding from incision. 4. Increased pain, redness, or drainage from the incision. 5. Increasing abdominal pain  The clinic staff is available to answer your questions during regular business hours.  Please don't hesitate to call and ask to speak to one of the nurses for clinical concerns.  If you have a medical emergency, go to the nearest emergency room or call 911.  A surgeon from [facility] is always on call at the hospital. [address], [address], [place], [place]  [address] ? [address] [address], [place], [place]   [address] [phone] ? [phone] ? [phone]   [phone] Web site: [email or web address]  

## 2015-05-31 NOTE — Progress Notes (Signed)
D/c instructions reviewed with pt and her father. Copy of instructions and script given to pt. Pt d/c'd via wheelchair with belongings with family, escorted by unit NT.

## 2015-06-02 NOTE — Discharge Summary (Signed)
Physician Discharge Summary  Patient ID: Wanda Oneill MRN: [id number] DOB/AGE: 36-Aug-1982 36 y.o.  Admit date: 05/30/2015 Discharge date: 06/02/2015  Admission Diagnoses: Incisional hernia Downs syndrome Obesity  Discharge Diagnoses:  Active Problems:   Incarcerated epigastric hernia   Discharged Condition: good  Hospital Course: 35 yof who I know well from prior surgery had an incarcerated epigastric hernia.  I had fixed multiple incisional hernias before with mesh.  I repaired this new one with mesh. She had some issues with oxygenation overnight which is not unexpected.  She is voiding, tolerating diet the following day.  She is ambulating with her mom well and ready to go home.   Consults: None  Significant Diagnostic Studies: none  Treatments: surgery: incisional hernia repair   Disposition: 01-Home or Self Care     Medication List    TAKE these medications        acetaminophen 500 MG tablet  Commonly known as:  TYLENOL  Take 2 tablets (1,000 mg total) by mouth every 8 (eight) hours as needed for mild pain or moderate pain. Take 2 tabs every 8 hours.     allopurinol 300 MG tablet  Commonly known as:  ZYLOPRIM  Take 300 mg by mouth daily.     augmented betamethasone dipropionate 0.05 % cream  Commonly known as:  DIPROLENE-AF  Apply topically 2 (two) times daily.     baclofen 10 MG tablet  Commonly known as:  LIORESAL  Take 10 mg by mouth daily as needed for muscle spasms.     blood glucose meter kit and supplies  Dispense based on patient and insurance preference. To check blood sugars daily FOR ICD-9 250.00     cetirizine 10 MG tablet  Commonly known as:  ZYRTEC  Take 10 mg by mouth daily as needed for allergies.     clobetasol cream 0.05 %  Commonly known as:  TEMOVATE  Apply 1 application topically daily as needed (for eczema).     dicyclomine 10 MG capsule  Commonly known as:  BENTYL  Take 2 capsules (20 mg total) by mouth 4 (four) times daily  -  before meals and at bedtime.     famotidine 20 MG tablet  Commonly known as:  PEPCID  Take 20 mg by mouth daily.     INVOKAMET 150-500 MG Tabs  Generic drug:  Canagliflozin-Metformin HCl  Take 1 tablet by mouth daily. Once daily with dinner     INVOKANA 300 MG Tabs tablet  Generic drug:  canagliflozin  Take 150 mg by mouth daily before breakfast.     levothyroxine 137 MCG tablet  Commonly known as:  SYNTHROID, LEVOTHROID  Take 1 tablet (137 mcg total) by mouth daily before breakfast. PATIENT NEEDS OFFICE VISIT/LABS FOR ADDITIONAL REFILLS     naproxen sodium 220 MG tablet  Commonly known as:  ANAPROX  Take 220 mg by mouth daily as needed (for pain).     norgestimate-ethinyl estradiol 0.25-35 MG-MCG tablet  Commonly known as:  ORTHO-CYCLEN,SPRINTEC,PREVIFEM  Take 1 tablet by mouth daily.     omeprazole 20 MG capsule  Commonly known as:  PRILOSEC  TAKE 1 CAPSULE (20 MG TOTAL) BY MOUTH DAILY.     ONE TOUCH ULTRA TEST test strip  Generic drug:  glucose blood  USE TO CHECK BLOOD SUGAR ONCE DAILY (ICD CODE:25.00)     sitaGLIPtin 100 MG tablet  Commonly known as:  JANUVIA  Take 1 tablet (100 mg total) by mouth daily.  traMADol 50 MG tablet  Commonly known as:  ULTRAM  Take 1 tablet (50 mg total) by mouth every 6 (six) hours as needed.     traMADol 50 MG tablet  Commonly known as:  ULTRAM  Take 1 tablet (50 mg total) by mouth every 6 (six) hours as needed for moderate pain.           Follow-up Information    Follow up with [facility], MD In 2 weeks.   Specialty:  General Surgery   Contact information:   [address] [place] [address] [phone]       Signed: Rolm Bookbinder 06/02/2015, 6:51 AM

## 2015-09-29 ENCOUNTER — Telehealth: Payer: Self-pay | Admitting: Internal Medicine

## 2015-09-29 NOTE — Telephone Encounter (Signed)
Wanda Oneill,  This is triage for Dr. Hilarie Fredrickson.

## 2015-10-02 NOTE — Telephone Encounter (Signed)
Paperwork [facility] to [facility] for xifaxan, pt aware.

## 2015-10-02 NOTE — Telephone Encounter (Signed)
Okay for rifaximin 550 mg TID x 14 days for IBS-d

## 2015-10-02 NOTE — Telephone Encounter (Signed)
Pt reports she is having issues with IBS-D again with abdominal pain, cramping and diarrhea. States she did well when she took the xifaxan back in October. Pt is requesting a refill on the xifaxan. Please advise.

## 2015-12-01 ENCOUNTER — Ambulatory Visit (HOSPITAL_COMMUNITY)
Admission: EM | Admit: 2015-12-01 | Discharge: 2015-12-01 | Disposition: A | Discharge status: Home / Self Care | Payer: Medicare Other | Attending: Internal Medicine | Admitting: Internal Medicine

## 2015-12-01 ENCOUNTER — Encounter (HOSPITAL_COMMUNITY): Payer: Self-pay | Admitting: Family Medicine

## 2015-12-01 ENCOUNTER — Ambulatory Visit (INDEPENDENT_AMBULATORY_CARE_PROVIDER_SITE_OTHER): Payer: Medicare Other

## 2015-12-01 DIAGNOSIS — J181 Lobar pneumonia, unspecified organism: Secondary | ICD-10-CM

## 2015-12-01 DIAGNOSIS — J189 Pneumonia, unspecified organism: Secondary | ICD-10-CM

## 2015-12-01 MED ORDER — LEVOFLOXACIN 750 MG PO TABS: 750.0000 mg | ORAL_TABLET | Freq: Every day | ORAL | 0 refills | Status: DC

## 2015-12-01 NOTE — ED Provider Notes (Signed)
CSN: [id number]     Arrival date & time 12/01/15  1251 History   First MD Initiated Contact with Patient 12/01/15 1341     Chief Complaint  Patient presents with  . Cough   (Consider location/radiation/quality/duration/timing/severity/associated sxs/prior Treatment) HPI  Wanda Oneill is a 35 y.o. female presenting to UC accompanied by her mother with concern for pneumonia.  Pt has Down Syndrome, majority of hx provided by mother.  Pt has had URI symptoms for about 3 weeks following a family vacation to the beach.  Pt's mother notes other family members had cold-like symptoms as well, however, yesterday, pt became very lethargic and started to cry due to not feeling well.  She was seen by a PA at her PCP office, dx with acute bronchitis and given a prescription for Azithromycin.  Mother notes her husband is a physician but is out of town, he recommended pt be seen  today as pt is still not any better and mother is requesting a CXR to r/o pneumonia due to hx of hospitalization in the past due to pneumonia. Pt has felt fatigued and has had a decreased appetite but no vomiting, diarrhea, chest pain or SOB.  Mild intermittent productive cough. No hx of asthma. She has never needed an inhaler when she has been sick.     Past Medical History:  Diagnosis Date  . Diabetes mellitus    type II  . Down syndrome   . Family history of adverse reaction to anesthesia    mom has had n/v  . Gastritis   . GERD (gastroesophageal reflux disease)   . Headache   . Hidradenitis   . Hypothyroidism   . Irritable bowel syndrome   . Periumbilical hernia   . Pneumonia   . Restless legs   . Tendonitis    chronic in left foot  . Thyroid disease    hypothyroidism   Past Surgical History:  Procedure Laterality Date  . AXILLARY HIDRADENITIS EXCISION Bilateral   . CHOLECYSTECTOMY    . HERNIA REPAIR     multiple  . INCISIONAL HERNIA REPAIR  05/30/2015   LAPROSCOPIC  . INCISIONAL HERNIA REPAIR N/A 05/30/2015    Procedure: LAPAROSCOPIC INCISIONAL HERNIA;  Surgeon: Rolm Bookbinder, MD;  Location: [place];  Service: General;  Laterality: N/A;  . INSERTION OF MESH N/A 05/30/2015   Procedure: INSERTION OF MESH;  Surgeon: Rolm Bookbinder, MD;  Location: [place];  Service: General;  Laterality: N/A;  . LAPAROSCOPY N/A 05/30/2015   Procedure: LAPAROSCOPY DIAGNOSTIC;  Surgeon: Rolm Bookbinder, MD;  Location: [facility];  Service: General;  Laterality: N/A;  . TONSILECTOMY, ADENOIDECTOMY, BILATERAL MYRINGOTOMY AND TUBES     Family History  Problem Relation Age of Onset  . Thyroid cancer Mother   . Diabetes type II Mother   . High Cholesterol Mother   . Heart disease Mother   . Diabetes type II Father   . Hypertension Father   . Stroke Father    Social History  Substance Use Topics  . Smoking status: Never Smoker  . Smokeless tobacco: Never Used  . Alcohol use No   OB History    No data available     Review of Systems  Constitutional: Positive for appetite change and fatigue. Negative for chills and fever.  HENT: Positive for congestion, rhinorrhea and sore throat (mild, from coughing). Negative for sneezing and voice change.   Respiratory: Positive for cough. Negative for chest tightness, shortness of breath, wheezing and stridor.  Cardiovascular: Negative for chest pain and palpitations.  Gastrointestinal: Negative for abdominal pain, diarrhea, nausea and vomiting.  Neurological: Positive for weakness ( generalized). Negative for syncope.    Allergies  Cefuroxime; Cefuroxime axetil; and Sulfa antibiotics  Home Medications   Prior to Admission medications   Medication Sig Start Date End Date Taking? Authorizing Provider  acetaminophen (TYLENOL) 500 MG tablet Take 2 tablets (1,000 mg total) by mouth every 8 (eight) hours as needed for mild pain or moderate pain. Take 2 tabs every 8 hours. Patient taking differently: Take 1,000 mg by mouth 2 (two) times daily as needed for mild pain or moderate  pain. Take 2 tabs every 8 hours. 02/06/15   Tereasa Coop, PA-C  allopurinol (ZYLOPRIM) 300 MG tablet Take 300 mg by mouth daily.    Historical Provider, MD  augmented betamethasone dipropionate (DIPROLENE-AF) 0.05 % cream Apply topically 2 (two) times daily. Patient taking differently: Apply 1 application topically daily as needed (for eczema).  10/21/11   Darlyne Russian, MD  baclofen (LIORESAL) 10 MG tablet Take 10 mg by mouth daily as needed for muscle spasms.  10/08/12   Historical Provider, MD  Blood Glucose Monitoring Suppl (BLOOD GLUCOSE METER KIT AND SUPPLIES) Dispense based on patient and insurance preference. To check blood sugars daily FOR ICD-9 250.00 08/31/13   Darlyne Russian, MD  canagliflozin (INVOKANA) 300 MG TABS tablet Take 150 mg by mouth daily before breakfast.     Historical Provider, MD  cetirizine (ZYRTEC) 10 MG tablet Take 10 mg by mouth daily as needed for allergies.     Historical Provider, MD  clobetasol cream (TEMOVATE) 7.20 % Apply 1 application topically daily as needed (for eczema).  10/07/12   Historical Provider, MD  dicyclomine (BENTYL) 10 MG capsule Take 2 capsules (20 mg total) by mouth 4 (four) times daily -  before meals and at bedtime. Patient taking differently: Take 20 mg by mouth 2 (two) times daily as needed for spasms.  03/05/13   Jerene Bears, MD  famotidine (PEPCID) 20 MG tablet Take 20 mg by mouth daily.     Historical Provider, MD  INVOKAMET 150-500 MG TABS Take 1 tablet by mouth daily. Once daily with dinner 04/21/15   Historical Provider, MD  levofloxacin (LEVAQUIN) 750 MG tablet Take 1 tablet (750 mg total) by mouth daily. 12/01/15   Noland Fordyce, PA-C  levothyroxine (SYNTHROID, LEVOTHROID) 137 MCG tablet Take 1 tablet (137 mcg total) by mouth daily before breakfast. PATIENT NEEDS OFFICE VISIT/LABS FOR ADDITIONAL REFILLS 02/05/13   Theda Sers, PA-C  naproxen sodium (ANAPROX) 220 MG tablet Take 220 mg by mouth daily as needed (for pain).     Historical  Provider, MD  norgestimate-ethinyl estradiol (ORTHO-CYCLEN,SPRINTEC,PREVIFEM) 0.25-35 MG-MCG tablet Take 1 tablet by mouth daily.    Historical Provider, MD  omeprazole (PRILOSEC) 20 MG capsule TAKE 1 CAPSULE (20 MG TOTAL) BY MOUTH DAILY. Patient taking differently: TAKE 1 CAPSULE (20 MG TOTAL) BY MOUTH DAILY AS NEEDED for acid reflux 08/01/14   Jerene Bears, MD  ONE TOUCH ULTRA TEST test strip USE TO CHECK BLOOD SUGAR ONCE DAILY (ICD CODE:25.00) 05/08/13   Mancel Bale, PA-C  sitaGLIPtin (JANUVIA) 100 MG tablet Take 1 tablet (100 mg total) by mouth daily. 10/21/11   Darlyne Russian, MD  traMADol (ULTRAM) 50 MG tablet Take 1 tablet (50 mg total) by mouth every 6 (six) hours as needed. Patient taking differently: Take 50 mg by mouth daily as  needed for moderate pain.  10/21/11   Darlyne Russian, MD  traMADol (ULTRAM) 50 MG tablet Take 1 tablet (50 mg total) by mouth every 6 (six) hours as needed for moderate pain. 05/31/15   Rolm Bookbinder, MD   Meds Ordered and Administered this Visit  Medications - No data to display  Temp 99.2 F (37.3 C)   Resp 20   LMP 12/01/2015   SpO2 96%  No data found.   Physical Exam  Constitutional: She appears well-developed and well-nourished. No distress.  Pt sitting in exam chair, appears well, NAD.  Non-toxic appearing.  HENT:  Head: Normocephalic and atraumatic.  Eyes: Conjunctivae are normal. No scleral icterus.  Neck: Normal range of motion. Neck supple.  Cardiovascular: Normal rate, regular rhythm and normal heart sounds.   Pulmonary/Chest: Effort normal. No stridor. No respiratory distress. She has decreased breath sounds in the left lower field. She has no wheezes. She has rhonchi ( faint rhonchi in lower lung fields bilaterally ). She has no rales.  Abdominal: Soft. She exhibits no distension. There is no tenderness.  Musculoskeletal: Normal range of motion.  Lymphadenopathy:    She has no cervical adenopathy.  Neurological: She is alert.  Skin:  Skin is warm and dry. She is not diaphoretic.  Nursing note and vitals reviewed.   Urgent Care Course   Clinical Course    Procedures (including critical care time)  Labs Review Labs Reviewed - No data to display  Imaging Review Dg Chest 2 View  Result Date: 12/01/2015 CLINICAL DATA:  Cough, chest congestion. EXAM: CHEST  2 VIEW COMPARISON:  02/06/2015 FINDINGS: There is consolidation noted in the left lower lobe compatible with pneumonia. Heart is normal in size. No confluent opacity on the right. No effusions or acute bony abnormality. IMPRESSION: Left lower lobe pneumonia. Electronically Signed   By: Rolm Baptise M.D.   On: 12/01/2015 13:39     MDM   1. Pneumonia of left lower lobe due to infectious organism [facility])    Pt presenting to UC with URI symptoms and concern for pneumonia despite being started on Azithromycin yesterday.  She has only had 1 dose of the antibiotic.   O2 Sat 96% on RA. No evidence of respiratory distress. Temp is 99.2*F  CXR: c/w Left lower lob pneumonia  Considered given IM injection of Rocephin due to potential resistance to Azithromycin in the community, however, pt has hx of hives and swelling with Ceftin. Will continue Azithromycin alone.   Encouraged mother to continue to give Azithromycin as it is an appropriate treatment for CAP.  However, due to hx of hospitalization and complications from pneumonia, discussed alternative treatment with Levaquin.  Provided paper prescription to hold for Levaquin (with expiration date). Advised to only give if pt not improving after 4th dose of Azithromycin. Encouraged to discuss with her husband (a physician) as well.  Discussed potential risk of tendon rupture and body/joint pain from Levaquin.    Discussed symptoms that warrant emergent care in the ED including passing out, unable to keep down fluids, or difficulty breathing. Patient's mother verbalized understanding and agreement with treatment plan.       Noland Fordyce, PA-C 12/01/15 1605

## 2015-12-01 NOTE — ED Triage Notes (Signed)
Pt here chills, cough, congestion. sts just started on Z pac and this morning became very weak.

## 2015-12-01 NOTE — Discharge Instructions (Signed)
°  Please continue to take the Azithromycin as prescribed it may take another 2-3 days of the medication to start to show improvement.    If symptoms continue to worsen after 3 more days of Azithromycin, you may start taking the Levaquin antibiotic.  There is a risk of severe tendon injury including tendon rupture and body aches/pains with Levaquin so it is important to only take if symptoms not improving.  If you have questions/concerns, please return to Urgent Care or follow up with her Primary Care Provider.  If she develops difficulty breathing, passing out, or other new concerning symptoms, please go to closest emergency department for further evaluation and treatment.

## 2015-12-04 ENCOUNTER — Ambulatory Visit (HOSPITAL_COMMUNITY)
Admission: EM | Admit: 2015-12-04 | Discharge: 2015-12-04 | Disposition: A | Discharge status: Home / Self Care | Payer: Medicare Other | Attending: Family Medicine | Admitting: Family Medicine

## 2015-12-04 ENCOUNTER — Ambulatory Visit (INDEPENDENT_AMBULATORY_CARE_PROVIDER_SITE_OTHER): Payer: Medicare Other

## 2015-12-04 ENCOUNTER — Encounter (HOSPITAL_COMMUNITY): Payer: Self-pay | Admitting: Emergency Medicine

## 2015-12-04 DIAGNOSIS — J181 Lobar pneumonia, unspecified organism: Secondary | ICD-10-CM

## 2015-12-04 DIAGNOSIS — J189 Pneumonia, unspecified organism: Secondary | ICD-10-CM

## 2015-12-04 MED ORDER — BENZONATATE 100 MG PO CAPS: 100.0000 mg | ORAL_CAPSULE | Freq: Three times a day (TID) | ORAL | 0 refills | Status: DC | PRN

## 2015-12-04 NOTE — ED Provider Notes (Signed)
[facility]    CSN: [id number] Arrival date & time: 12/04/15  1225     History   Chief Complaint Chief Complaint  Patient presents with  . Follow-up    HPI Wanda Oneill is a 35 y.o. female.   This a 35 year old mentally retarded girl who comes in for follow-up on pneumonia. Father was out of town and mother is leaving on Friday to join him in [place]. Wanda Oneill lives alone.  This is the fourth episode of pneumonia for Wanda Oneill over the last 10 years. It's not as severe as previous pneumonias and she is getting better. She still coughing quite a bit however. She was given a Z-Pak last Thursday and is about to complete it. She has no shortness of breath or fevers result of the last 24 hours.      Past Medical History:  Diagnosis Date  . Diabetes mellitus    type II  . Down syndrome   . Family history of adverse reaction to anesthesia    mom has had n/v  . Gastritis   . GERD (gastroesophageal reflux disease)   . Headache   . Hidradenitis   . Hypothyroidism   . Irritable bowel syndrome   . Periumbilical hernia   . Pneumonia   . Restless legs   . Tendonitis    chronic in left foot  . Thyroid disease    hypothyroidism    Patient Active Problem List   Diagnosis Date Noted  . Incarcerated epigastric hernia 05/30/2015  . Left foot pain 08/09/2014  . Chronic diarrhea 07/27/2014  . Gout 08/10/2012  . Acid reflux 01/22/2012  . Down syndrome 11/11/2011  . Hernia of abdominal wall 10/21/2011  . Psoriasis 10/21/2011  . Type 2 diabetes mellitus, uncontrolled ([facility]) 04/12/2011  . Hypothyroid 04/12/2011  . Gait abnormality 06/22/2010  . Tibial tendinitis, posterior 06/22/2010    Past Surgical History:  Procedure Laterality Date  . AXILLARY HIDRADENITIS EXCISION Bilateral   . CHOLECYSTECTOMY    . HERNIA REPAIR     multiple  . INCISIONAL HERNIA REPAIR  05/30/2015   LAPROSCOPIC  . INCISIONAL HERNIA REPAIR N/A 05/30/2015   Procedure: LAPAROSCOPIC INCISIONAL  HERNIA;  Surgeon: Rolm Bookbinder, MD;  Location: [facility];  Service: General;  Laterality: N/A;  . INSERTION OF MESH N/A 05/30/2015   Procedure: INSERTION OF MESH;  Surgeon: Rolm Bookbinder, MD;  Location: [facility];  Service: General;  Laterality: N/A;  . LAPAROSCOPY N/A 05/30/2015   Procedure: LAPAROSCOPY DIAGNOSTIC;  Surgeon: Rolm Bookbinder, MD;  Location: [facility];  Service: General;  Laterality: N/A;  . TONSILECTOMY, ADENOIDECTOMY, BILATERAL MYRINGOTOMY AND TUBES      OB History    No data available       Home Medications    Prior to Admission medications   Medication Sig Start Date End Date Taking? Authorizing Provider  acetaminophen (TYLENOL) 500 MG tablet Take 2 tablets (1,000 mg total) by mouth every 8 (eight) hours as needed for mild pain or moderate pain. Take 2 tabs every 8 hours. Patient taking differently: Take 1,000 mg by mouth 2 (two) times daily as needed for mild pain or moderate pain. Take 2 tabs every 8 hours. 02/06/15   Tereasa Coop, PA-C  allopurinol (ZYLOPRIM) 300 MG tablet Take 300 mg by mouth daily.    Historical Provider, MD  augmented betamethasone dipropionate (DIPROLENE-AF) 0.05 % cream Apply topically 2 (two) times daily. Patient taking differently: Apply 1 application topically daily as needed (for eczema).  10/21/11   Darlyne Russian, MD  baclofen (LIORESAL) 10 MG tablet Take 10 mg by mouth daily as needed for muscle spasms.  10/08/12   Historical Provider, MD  benzonatate (TESSALON) 100 MG capsule Take 1-2 capsules (100-200 mg total) by mouth 3 (three) times daily as needed for cough. 12/04/15   Robyn Haber, MD  Blood Glucose Monitoring Suppl (BLOOD GLUCOSE METER KIT AND SUPPLIES) Dispense based on patient and insurance preference. To check blood sugars daily FOR ICD-9 250.00 08/31/13   Darlyne Russian, MD  canagliflozin (INVOKANA) 300 MG TABS tablet Take 150 mg by mouth daily before breakfast.     Historical Provider, MD  cetirizine (ZYRTEC) 10 MG tablet Take 10  mg by mouth daily as needed for allergies.     Historical Provider, MD  clobetasol cream (TEMOVATE) 7.82 % Apply 1 application topically daily as needed (for eczema).  10/07/12   Historical Provider, MD  dicyclomine (BENTYL) 10 MG capsule Take 2 capsules (20 mg total) by mouth 4 (four) times daily -  before meals and at bedtime. Patient taking differently: Take 20 mg by mouth 2 (two) times daily as needed for spasms.  03/05/13   Jerene Bears, MD  famotidine (PEPCID) 20 MG tablet Take 20 mg by mouth daily.     Historical Provider, MD  INVOKAMET 150-500 MG TABS Take 1 tablet by mouth daily. Once daily with dinner 04/21/15   Historical Provider, MD  levothyroxine (SYNTHROID, LEVOTHROID) 137 MCG tablet Take 1 tablet (137 mcg total) by mouth daily before breakfast. PATIENT NEEDS OFFICE VISIT/LABS FOR ADDITIONAL REFILLS 02/05/13   Theda Sers, PA-C  naproxen sodium (ANAPROX) 220 MG tablet Take 220 mg by mouth daily as needed (for pain).     Historical Provider, MD  norgestimate-ethinyl estradiol (ORTHO-CYCLEN,SPRINTEC,PREVIFEM) 0.25-35 MG-MCG tablet Take 1 tablet by mouth daily.    Historical Provider, MD  omeprazole (PRILOSEC) 20 MG capsule TAKE 1 CAPSULE (20 MG TOTAL) BY MOUTH DAILY. Patient taking differently: TAKE 1 CAPSULE (20 MG TOTAL) BY MOUTH DAILY AS NEEDED for acid reflux 08/01/14   Jerene Bears, MD  ONE TOUCH ULTRA TEST test strip USE TO CHECK BLOOD SUGAR ONCE DAILY (ICD CODE:25.00) 05/08/13   Mancel Bale, PA-C  sitaGLIPtin (JANUVIA) 100 MG tablet Take 1 tablet (100 mg total) by mouth daily. 10/21/11   Darlyne Russian, MD  traMADol (ULTRAM) 50 MG tablet Take 1 tablet (50 mg total) by mouth every 6 (six) hours as needed. Patient taking differently: Take 50 mg by mouth daily as needed for moderate pain.  10/21/11   Darlyne Russian, MD  traMADol (ULTRAM) 50 MG tablet Take 1 tablet (50 mg total) by mouth every 6 (six) hours as needed for moderate pain. 05/31/15   Rolm Bookbinder, MD    Family  History Family History  Problem Relation Age of Onset  . Thyroid cancer Mother   . Diabetes type II Mother   . High Cholesterol Mother   . Heart disease Mother   . Diabetes type II Father   . Hypertension Father   . Stroke Father     Social History Social History  Substance Use Topics  . Smoking status: Never Smoker  . Smokeless tobacco: Never Used  . Alcohol use No     Allergies   Cefuroxime; Cefuroxime axetil; and Sulfa antibiotics   Review of Systems Review of Systems  Constitutional: Negative.   HENT: Negative.   Eyes: Negative.   Respiratory: Positive for  cough.   Cardiovascular: Negative.   Neurological: Negative.      Physical Exam Triage Vital Signs ED Triage Vitals  Enc Vitals Group     BP 12/04/15 1257 119/78     Pulse Rate 12/04/15 1257 83     Resp 12/04/15 1257 20     Temp 12/04/15 1257 98.2 F (36.8 C)     Temp Source 12/04/15 1257 Oral     SpO2 12/04/15 1257 97 %     Weight --      Height --      Head Circumference --      Peak Flow --      Pain Score 12/04/15 1302 0     Pain Loc --      Pain Edu? --      Excl. in [place]? --    No data found.   Updated Vital Signs BP 119/78 (BP Location: Left Wrist)   Pulse 83   Temp 98.2 F (36.8 C) (Oral)   Resp 20   LMP 12/01/2015   SpO2 97%   Visual Acuity Right Eye Distance:   Left Eye Distance:   Bilateral Distance:    Right Eye Near:   Left Eye Near:    Bilateral Near:     Physical Exam  Constitutional: She is oriented to person, place, and time. She appears well-developed and well-nourished.  HENT:  Head: Normocephalic and atraumatic.  Right Ear: External ear normal.  Left Ear: External ear normal.  Mouth/Throat: Oropharynx is clear and moist.  Eyes: Conjunctivae are normal. Pupils are equal, round, and reactive to light.  Neck: Normal range of motion. Neck supple.  Cardiovascular: Normal rate, regular rhythm and normal heart sounds.   Pulmonary/Chest: Effort normal. She has  rales.  Left lower lobe rales and expiratory wheezes on the left.  Neurological: She is alert and oriented to person, place, and time.  Skin: Skin is warm and dry.  Nursing note and vitals reviewed.    UC Treatments / Results  Labs (all labs ordered are listed, but only abnormal results are displayed) Labs Reviewed - No data to display  EKG  EKG Interpretation None       Radiology Dg Chest 2 View  Result Date: 12/04/2015 CLINICAL DATA:  Cough. EXAM: CHEST  2 VIEW COMPARISON:  12/01/2015. FINDINGS: Heart size stable. Persistent left lower lung infiltrate noted with slight interim clearing. Associated mild atelectasis noted both lung bases. No pleural effusion or pneumothorax. IMPRESSION: Persistent left lower lung infiltrate noted with slight interim clearing. Electronically Signed   By: Marcello Moores  Register   On: 12/04/2015 13:46    Procedures Procedures (including critical care time)  Medications Ordered in UC Medications - No data to display   Initial Impression / Assessment and Plan / UC Course  I have reviewed the triage vital signs and the nursing notes.  Pertinent labs & imaging results that were available during my care of the patient were reviewed by me and considered in my medical decision making (see chart for details).  Clinical Course    Final Clinical Impressions(s) / UC Diagnoses   Final diagnoses:  Pneumonia of left lower lobe due to infectious organism [facility])   Symptoms and x-ray show improvement. New Prescriptions New Prescriptions   BENZONATATE (TESSALON) 100 MG CAPSULE    Take 1-2 capsules (100-200 mg total) by mouth 3 (three) times daily as needed for cough.     Robyn Haber, MD 12/04/15 1355

## 2015-12-04 NOTE — ED Triage Notes (Signed)
The patient presented to the [facility] to follow up on a pneumonia diagnosis that was made at the [facility] on 12/01/2015. The patient was prescribed a Z-Pac and will complete that tonight as prescribed. The patient reported continued cough and fatigue. The patient requested a refill for the tessalon pearls.

## 2015-12-04 NOTE — Discharge Instructions (Signed)
Study Result   CLINICAL DATA:  Cough.   EXAM: CHEST  2 VIEW   COMPARISON:  12/01/2015.   FINDINGS: Heart size stable. Persistent left lower lung infiltrate noted with slight interim clearing. Associated mild atelectasis noted both lung bases. No pleural effusion or pneumothorax.   IMPRESSION: Persistent left lower lung infiltrate noted with slight interim clearing.     Electronically Signed   By: Marcello Moores  Register   On: 12/04/2015 13:46

## 2016-01-15 ENCOUNTER — Telehealth: Payer: Self-pay | Admitting: Internal Medicine

## 2016-01-15 NOTE — Telephone Encounter (Signed)
Mother states the xifaxan is not helping and that Dr. Hilarie Fredrickson mentioned starting her on something different if it didn't help. She would like something else called in. Please advise.

## 2016-01-16 NOTE — Telephone Encounter (Signed)
Please let her mother know that previously I had considered Viberzi for her loose stools. Since this discussion Viberzi is now contraindicated in patients without gallbladder, such as Wanda Oneill If loose stools continue to be an issue we could consider colestipol 2 g in the morning. This dose can be titrated based on result. I'm okay trying this but it needs to be separated by 2 hours on either side of other medications Office follow-up with me or APP is recommended for continuity

## 2016-01-17 NOTE — Telephone Encounter (Signed)
Pts mother is aware and states that she is having more of an issue with frequency and abdominal cramping that diarrhea. Would like to schedule an appt to be seen. Pt scheduled to see Dr. Hilarie Fredrickson 01/26/16 @9am . Pts mother aware of appt.

## 2016-01-22 ENCOUNTER — Encounter: Payer: Self-pay | Admitting: *Deleted

## 2016-01-26 ENCOUNTER — Encounter: Payer: Self-pay | Admitting: Internal Medicine

## 2016-01-26 ENCOUNTER — Ambulatory Visit (INDEPENDENT_AMBULATORY_CARE_PROVIDER_SITE_OTHER): Payer: Medicare Other | Admitting: Internal Medicine

## 2016-01-26 ENCOUNTER — Other Ambulatory Visit: Payer: Medicare Other

## 2016-01-26 VITALS — BP 110/78 | HR 96 | Ht 64.0 in | Wt 240.2 lb

## 2016-01-26 DIAGNOSIS — R195 Other fecal abnormalities: Secondary | ICD-10-CM

## 2016-01-26 DIAGNOSIS — R1030 Lower abdominal pain, unspecified: Secondary | ICD-10-CM

## 2016-01-26 DIAGNOSIS — K589 Irritable bowel syndrome without diarrhea: Secondary | ICD-10-CM | POA: Diagnosis not present

## 2016-01-26 DIAGNOSIS — R1013 Epigastric pain: Secondary | ICD-10-CM

## 2016-01-26 DIAGNOSIS — K219 Gastro-esophageal reflux disease without esophagitis: Secondary | ICD-10-CM | POA: Diagnosis not present

## 2016-01-26 MED ORDER — DICYCLOMINE HCL 20 MG PO TABS: 20.0000 mg | ORAL_TABLET | Freq: Two times a day (BID) | ORAL | 2 refills | Status: DC

## 2016-01-26 NOTE — Progress Notes (Signed)
Subjective:    Patient ID: Wanda Oneill, female    DOB: 1980-09-13, 35 y.o.   MRN: [id number]  HPI Wanda Oneill is a 35 year old female with a history of GERD, dyspepsia, chronic loose stools with abdominal pain, Down syndrome, hypothyroidism, type 2 diabetes, ventral hernia status post several repairs and history of cholecystectomy who is here for follow-up. She is here today with her mother and father.  She has continued to struggle with loose stools, worse after eating. At times this can be urgent. Stools are nonbloody and non-melenic. Associated with lower abdominal cramping which can be intense and even "make her cry". Stools can be as frequently as 5-6 times in the morning and a very severe day maybe 10-12 times per day. Symptoms are typically not nocturnal. She was treated with rifaximin about 10 months ago and had at least 2 months of near complete remission of her loose stools. Her mother states she was "in love" with this medication. Symptoms recurred and she was retreated in October 2017. During this time symptoms again improved dramatically but the benefit on lasted about 2 weeks after therapy. She is currently using Bentyl 20 mg tablets twice per day occasionally she will take 2 tablets when her lower crampy pain is severe. She feels that her metformin also affects her loose stools. Her blood sugars on the whole are fairly well controlled around usually 150 fasting.  She does report issues with indigestion and reflux which is separate from her lower abdominal pain. It seems that she is using both omeprazole more on an as-needed basis but several times per week. This is 20 mg when she uses it. She also will use famotidine 20 mg daily as needed. She is normally taking at least one of these 2 medications daily.  Diet varies and she admits to having a hard time avoiding high fat foods. She does work to try to control her blood sugars. She states that she has received advice from "friends at  church" about needing to try various types of diet which feels overwhelming to her. During this discussion she became tearful.  Previously upper endoscopy was performed on 04/07/2013 -- this revealed normal esophagus, mild gastritis and mild duodenal bulb inflammation. Biopsies showed benign small bowel and minimal chronic inflammation in the stomach. There is no metaplasia, dysplasia or H. pylori. There is no evidence of celiac disease. TTG was also negative  Review of Systems As per HPI, otherwise negative  Current Medications, Allergies, Past Medical History, Past Surgical History, Family History and Social History were reviewed in [employer] record.     Objective:   Physical Exam BP 110/78   Pulse 96   Ht 5\' 4"  (1.626 m)   Wt 240 lb 4 oz (109 kg)   BMI 41.24 kg/m  Constitutional: Well-developed and well-nourished. Appearance consistent with Down syndrome. No distress. HEENT: Normocephalic and atraumatic. Oropharynx is clear and moist. No oropharyngeal exudate. Conjunctivae are normal.  No scleral icterus. Neck: Neck supple. Trachea midline. Cardiovascular: Normal rate, regular rhythm and intact distal pulses. No M/R/G Pulmonary/chest: Effort normal and breath sounds normal. No wheezing, rales or rhonchi. Abdominal: Soft, obese, well-healed abdominal scars nontender, nondistended. Bowel sounds active throughout.  Extremities: no clubbing, cyanosis, or edema Lymphadenopathy: No cervical adenopathy noted. Neurological: Alert and oriented to person place and time. Skin: Skin is warm and dry. No rashes noted. Psychiatric: Normal mood and affect. Behavior is normal.      Assessment & Plan:  35 year old female with a history of GERD, dyspepsia, chronic loose stools with abdominal pain, Down syndrome, hypothyroidism, type 2 diabetes, ventral hernia status post several repairs and history of cholecystectomy who is here for follow-up.   1. Chronic loose  stools/lower abdominal pain/IBS -- excellent response to rifaximin highly suggestive of IBS. Unfortunately the benefit of rifaximin did not last very long after recent re-treatment. I explained that this medication is not intended for daily and ongoing use. We had a long discussion regarding management of the symptoms as well as medications. --I would like her to take Bentyl 20 mg twice daily on schedule. She can add a third or even fourth dose if symptoms are severe. Do not exceed 80 mg in 24 hours --We're going to do a trial of changing acid suppression therapy to see if this affects loose stools. I'm going to have her do Pepcid 20 mg twice daily without omeprazole for 1-2 weeks. If loose stools improve and GERD/dyspepsia controlled will continue this dose. If not discontinue Pepcid and try omeprazole 20 mg daily alone. We will allow time to pass before determining which therapy works best for her. Her mother and father will help with this trial --She will work with primary care and her father, who is a primary physician, on trying to reduce metformin if allowable by her blood sugars. Metformin likely contributing, degree of which unknown, to her loose stools and lower abdominal cramping --If no benefit from the above could consider a trial of probiotic, Creon or colestipol. Likely in this order --I've asked that she submit a fecal elastase --FODMAP diet discussed and recommended  2. GERD and dyspepsia -- see above  Three-month follow-up, sooner if necessary  45 minutes spent with the patient today. Greater than 50% was spent in counseling and coordination of care with the patient

## 2016-01-26 NOTE — Patient Instructions (Addendum)
Your physician has requested that you go to the basement for the following lab work before leaving today: Fecal elastase, fecal leukocytes, Ova and Parasites  Continue Bentyl 20 mg twice daily. We have sent a prescription of this to your pharmacy.  Lets try changing your meds some: For 1 week, take Pepcid 20 mg twice daily WITHOUT omeprazole. Then, for 1 week, take omeprazole 20 mg daily WITHOUT pepcid.  We may need to consider decreasing your metformin dosing to see if this helps with the loose stool.  Call our office after you have finished the 2 week trial of medication changes to let us know how you are doing. Our phone number is [phone].  We have given you a copy of a FODMAP diet to look over and follow.  If you are age 41 or older, your body mass index should be between 23-30. Your Body mass index is 41.24 kg/m. If this is out of the aforementioned range listed, please consider follow up with your Primary Care Provider.  If you are age 91 or younger, your body mass index should be between 19-25. Your Body mass index is 41.24 kg/m. If this is out of the aformentioned range listed, please consider follow up with your Primary Care Provider.

## 2016-01-28 ENCOUNTER — Emergency Department (HOSPITAL_BASED_OUTPATIENT_CLINIC_OR_DEPARTMENT_OTHER): Payer: Medicare Other

## 2016-01-28 ENCOUNTER — Encounter (HOSPITAL_BASED_OUTPATIENT_CLINIC_OR_DEPARTMENT_OTHER): Payer: Self-pay | Admitting: *Deleted

## 2016-01-28 ENCOUNTER — Emergency Department (HOSPITAL_BASED_OUTPATIENT_CLINIC_OR_DEPARTMENT_OTHER)
Admission: EM | Admit: 2016-01-28 | Discharge: 2016-01-28 | Disposition: A | Discharge status: Home / Self Care | Payer: Medicare Other | Attending: Emergency Medicine | Admitting: Emergency Medicine

## 2016-01-28 DIAGNOSIS — E039 Hypothyroidism, unspecified: Secondary | ICD-10-CM | POA: Insufficient documentation

## 2016-01-28 DIAGNOSIS — Z79899 Other long term (current) drug therapy: Secondary | ICD-10-CM | POA: Diagnosis not present

## 2016-01-28 DIAGNOSIS — B9789 Other viral agents as the cause of diseases classified elsewhere: Secondary | ICD-10-CM

## 2016-01-28 DIAGNOSIS — J069 Acute upper respiratory infection, unspecified: Secondary | ICD-10-CM | POA: Diagnosis not present

## 2016-01-28 DIAGNOSIS — R05 Cough: Secondary | ICD-10-CM | POA: Diagnosis present

## 2016-01-28 DIAGNOSIS — Z7984 Long term (current) use of oral hypoglycemic drugs: Secondary | ICD-10-CM | POA: Insufficient documentation

## 2016-01-28 DIAGNOSIS — E119 Type 2 diabetes mellitus without complications: Secondary | ICD-10-CM | POA: Insufficient documentation

## 2016-01-28 MED ORDER — BENZONATATE 100 MG PO CAPS: 100.0000 mg | ORAL_CAPSULE | Freq: Three times a day (TID) | ORAL | 0 refills | Status: DC

## 2016-01-28 NOTE — ED Provider Notes (Signed)
[facility] Provider Note   CSN: [id number] Arrival date & time: 01/28/16  1254     History   Chief Complaint Chief Complaint  Patient presents with  . Cough    HPI Wanda Oneill is a 35 y.o. female.  HPI Patient presents to the emergency department with cough and nasal congestion over the last 3 days.  The patient states that she has had cough with nasal congestion that seems to be worsening.  Patient states that she normally takes [employer] for cough and seems to help with her symptoms.  Patient states nothing seems make the condition worseThe patient denies chest pain, shortness of breath, headache,blurred vision, neck pain, fever, weakness, numbness, dizziness, anorexia, edema, abdominal pain, nausea, vomiting, diarrhea, rash, back pain, dysuria, hematemesis, bloody stool, near syncope, or syncope. Past Medical History:  Diagnosis Date  . Diabetes mellitus    type II  . Down syndrome   . Family history of adverse reaction to anesthesia    mom has had n/v  . Gastritis   . GERD (gastroesophageal reflux disease)   . Headache   . Hepatic steatosis   . Hidradenitis   . Hypothyroidism   . Irritable bowel syndrome   . Periumbilical hernia   . Pneumonia   . Restless legs   . Tendonitis    chronic in left foot  . Thyroid disease    hypothyroidism    Patient Active Problem List   Diagnosis Date Noted  . Incarcerated epigastric hernia 05/30/2015  . Left foot pain 08/09/2014  . Chronic diarrhea 07/27/2014  . Gout 08/10/2012  . Acid reflux 01/22/2012  . Down syndrome 11/11/2011  . Hernia of abdominal wall 10/21/2011  . Psoriasis 10/21/2011  . Type 2 diabetes mellitus, uncontrolled ([place]) 04/12/2011  . Hypothyroid 04/12/2011  . Gait abnormality 06/22/2010  . Tibial tendinitis, posterior 06/22/2010    Past Surgical History:  Procedure Laterality Date  . AXILLARY HIDRADENITIS EXCISION Bilateral   . CHOLECYSTECTOMY    . HERNIA REPAIR     multiple   . INCISIONAL HERNIA REPAIR  05/30/2015   LAPROSCOPIC  . INCISIONAL HERNIA REPAIR N/A 05/30/2015   Procedure: LAPAROSCOPIC INCISIONAL HERNIA;  Surgeon: Rolm Bookbinder, MD;  Location: [facility];  Service: General;  Laterality: N/A;  . INSERTION OF MESH N/A 05/30/2015   Procedure: INSERTION OF MESH;  Surgeon: Rolm Bookbinder, MD;  Location: [facility];  Service: General;  Laterality: N/A;  . LAPAROSCOPY N/A 05/30/2015   Procedure: LAPAROSCOPY DIAGNOSTIC;  Surgeon: Rolm Bookbinder, MD;  Location: [facility];  Service: General;  Laterality: N/A;  . TONSILECTOMY, ADENOIDECTOMY, BILATERAL MYRINGOTOMY AND TUBES      OB History    No data available       Home Medications    Prior to Admission medications   Medication Sig Start Date End Date Taking? Authorizing Provider  acetaminophen (TYLENOL) 500 MG tablet Take 2 tablets (1,000 mg total) by mouth every 8 (eight) hours as needed for mild pain or moderate pain. Take 2 tabs every 8 hours. Patient taking differently: Take 1,000 mg by mouth 2 (two) times daily as needed for mild pain or moderate pain. Take 2 tabs every 8 hours. 02/06/15   Tereasa Coop, PA-C  allopurinol (ZYLOPRIM) 300 MG tablet Take 300 mg by mouth daily.    Historical Provider, MD  baclofen (LIORESAL) 10 MG tablet Take 10 mg by mouth daily as needed for muscle spasms.  10/08/12   Historical Provider, MD  benzonatate (  TESSALON) 100 MG capsule Take 1 capsule (100 mg total) by mouth every 8 (eight) hours. 01/28/16   Dalia Heading, PA-C  Blood Glucose Monitoring Suppl (BLOOD GLUCOSE METER KIT AND SUPPLIES) Dispense based on patient and insurance preference. To check blood sugars daily FOR ICD-9 250.00 08/31/13   Darlyne Russian, MD  cetirizine (ZYRTEC) 10 MG tablet Take 10 mg by mouth daily as needed for allergies.     Historical Provider, MD  clobetasol cream (TEMOVATE) 5.40 % Apply 1 application topically daily as needed (for eczema).  10/07/12   Historical Provider, MD  dicyclomine (BENTYL) 20  MG tablet Take 1 tablet (20 mg total) by mouth 2 (two) times daily. 01/26/16   Jerene Bears, MD  famotidine (PEPCID) 20 MG tablet Take 20 mg by mouth daily.     Historical Provider, MD  INVOKAMET 150-500 MG TABS Take 1 tablet by mouth daily. Once daily with dinner 04/21/15   Historical Provider, MD  levothyroxine (SYNTHROID, LEVOTHROID) 137 MCG tablet Take 1 tablet (137 mcg total) by mouth daily before breakfast. PATIENT NEEDS OFFICE VISIT/LABS FOR ADDITIONAL REFILLS 02/05/13   Theda Sers, PA-C  metFORMIN (GLUCOPHAGE) 1000 MG tablet 2 (two) times daily. 01/16/16   Historical Provider, MD  naproxen sodium (ANAPROX) 220 MG tablet Take 220 mg by mouth daily as needed (for pain).     Historical Provider, MD  norgestimate-ethinyl estradiol (ORTHO-CYCLEN,SPRINTEC,PREVIFEM) 0.25-35 MG-MCG tablet Take 1 tablet by mouth daily.    Historical Provider, MD  omeprazole (PRILOSEC) 20 MG capsule TAKE 1 CAPSULE (20 MG TOTAL) BY MOUTH DAILY. Patient taking differently: TAKE 1 CAPSULE (20 MG TOTAL) BY MOUTH DAILY AS NEEDED for acid reflux 08/01/14   Jerene Bears, MD  ONE TOUCH ULTRA TEST test strip USE TO CHECK BLOOD SUGAR ONCE DAILY (ICD CODE:25.00) 05/08/13   Mancel Bale, PA-C  sitaGLIPtin (JANUVIA) 100 MG tablet Take 1 tablet (100 mg total) by mouth daily. 10/21/11   Darlyne Russian, MD    Family History Family History  Problem Relation Age of Onset  . Thyroid cancer Mother   . Diabetes type II Mother   . High Cholesterol Mother   . Heart disease Mother   . Diabetes type II Father   . Hypertension Father   . Stroke Father     Social History Social History  Substance Use Topics  . Smoking status: Never Smoker  . Smokeless tobacco: Never Used  . Alcohol use No     Allergies   Cefuroxime; Cefuroxime axetil; and Sulfa antibiotics   Review of Systems Review of Systems  All other systems negative except as documented in the HPI. All pertinent positives and negatives as reviewed in the  HPI. Physical Exam Updated Vital Signs BP 120/70 (BP Location: Right Arm)   Pulse 85   Temp 97.6 F (36.4 C) (Oral)   Resp 18   Ht '5\' 4"'$  (1.626 m)   Wt 108.9 kg   LMP 01/14/2016   SpO2 96%   BMI 41.20 kg/m   Physical Exam  Constitutional: She is oriented to person, place, and time. She appears well-developed and well-nourished. No distress.  HENT:  Head: Normocephalic and atraumatic.  Mouth/Throat: Oropharynx is clear and moist.  Eyes: Pupils are equal, round, and reactive to light.  Neck: Normal range of motion. Neck supple.  Cardiovascular: Normal rate, regular rhythm and normal heart sounds.  Exam reveals no gallop and no friction rub.   No murmur heard. Pulmonary/Chest: Effort normal and  breath sounds normal. No respiratory distress. She has no wheezes.  Neurological: She is alert and oriented to person, place, and time. She exhibits normal muscle tone. Coordination normal.  Skin: Skin is warm and dry. No rash noted. No erythema.  Psychiatric: She has a normal mood and affect. Her behavior is normal.  Nursing note and vitals reviewed.    ED Treatments / Results  Labs (all labs ordered are listed, but only abnormal results are displayed) Labs Reviewed - No data to display  EKG  EKG Interpretation None       Radiology Dg Chest 2 View  Result Date: 01/28/2016 CLINICAL DATA:  Cough x2 days EXAM: CHEST  2 VIEW COMPARISON:  None. FINDINGS: Mild lower lobe scarring. No focal consolidation. No pleural effusion or pneumothorax. The heart is normal in size. Visualized osseous structures are within normal limits. IMPRESSION: No evidence of acute cardiopulmonary disease. Electronically Signed   By: Julian Hy M.D.   On: 01/28/2016 13:19    Procedures Procedures (including critical care time)  Medications Ordered in ED Medications - No data to display   Initial Impression / Assessment and Plan / ED Course  I have reviewed the triage vital signs and the nursing  notes.  Pertinent labs & imaging results that were available during my care of the patient were reviewed by me and considered in my medical decision making (see chart for details).  Clinical Course     History of present illness treated for viral URI with cough.  Told to return here as needed.  Advised follow-up with her primary care Dr. increase her fluid intake, rest as much as possible  Final Clinical Impressions(s) / ED Diagnoses   Final diagnoses:  Viral URI with cough    New Prescriptions Discharge Medication List as of 01/28/2016  2:22 PM       Dalia Heading, PA-C 01/28/16 [place], MD 02/02/16 [phone]

## 2016-01-28 NOTE — ED Triage Notes (Signed)
Cough with sputum. Hx of pneumonia.

## 2016-01-28 NOTE — Discharge Instructions (Signed)
Return here as needed.  Follow-up with your primary care doctor, increase your fluid intake, rest as much as possible °

## 2016-02-10 ENCOUNTER — Ambulatory Visit (INDEPENDENT_AMBULATORY_CARE_PROVIDER_SITE_OTHER): Payer: Medicare Other

## 2016-02-10 ENCOUNTER — Ambulatory Visit (HOSPITAL_COMMUNITY)
Admission: EM | Admit: 2016-02-10 | Discharge: 2016-02-10 | Disposition: A | Discharge status: Home / Self Care | Payer: Medicare Other | Attending: Family Medicine | Admitting: Family Medicine

## 2016-02-10 ENCOUNTER — Encounter (HOSPITAL_COMMUNITY): Payer: Self-pay | Admitting: *Deleted

## 2016-02-10 DIAGNOSIS — B9789 Other viral agents as the cause of diseases classified elsewhere: Secondary | ICD-10-CM | POA: Diagnosis not present

## 2016-02-10 DIAGNOSIS — J069 Acute upper respiratory infection, unspecified: Secondary | ICD-10-CM

## 2016-02-10 MED ORDER — IPRATROPIUM BROMIDE 0.06 % NA SOLN: 2.0000 | Freq: Four times a day (QID) | NASAL | 1 refills | Status: DC

## 2016-02-10 MED ORDER — PREDNISONE 50 MG PO TABS: ORAL_TABLET | ORAL | 0 refills | Status: DC

## 2016-02-10 MED ORDER — BENZONATATE 200 MG PO CAPS: 200.0000 mg | ORAL_CAPSULE | Freq: Three times a day (TID) | ORAL | 1 refills | Status: DC | PRN

## 2016-02-10 NOTE — Discharge Instructions (Signed)
Drink plenty of fluids as discussed, use medicine as prescribed, and mucinex or delsym for cough. Return or see your doctor if further problems °

## 2016-02-10 NOTE — ED Provider Notes (Signed)
[facility]    CSN: [id number] Arrival date & time: 02/10/16  1548     History   Chief Complaint Chief Complaint  Patient presents with  . Cough    HPI Wanda Oneill is a 36 y.o. female.   The history is provided by the patient and a parent.  Cough  Cough characteristics:  Productive Sputum characteristics:  Yellow Severity:  Moderate Onset quality:  Gradual Duration:  3 weeks Progression:  Unchanged Chronicity:  New Smoker: no   Context: sick contacts, upper respiratory infection and weather changes   Associated symptoms: rhinorrhea and sinus congestion   Associated symptoms: no chest pain, no fever, no shortness of breath and no wheezing     Past Medical History:  Diagnosis Date  . Diabetes mellitus    type II  . Down syndrome   . Family history of adverse reaction to anesthesia    mom has had n/v  . Gastritis   . GERD (gastroesophageal reflux disease)   . Headache   . Hepatic steatosis   . Hidradenitis   . Hypothyroidism   . Irritable bowel syndrome   . Periumbilical hernia   . Pneumonia   . Restless legs   . Tendonitis    chronic in left foot  . Thyroid disease    hypothyroidism    Patient Active Problem List   Diagnosis Date Noted  . Incarcerated epigastric hernia 05/30/2015  . Left foot pain 08/09/2014  . Chronic diarrhea 07/27/2014  . Gout 08/10/2012  . Acid reflux 01/22/2012  . Down syndrome 11/11/2011  . Hernia of abdominal wall 10/21/2011  . Psoriasis 10/21/2011  . Type 2 diabetes mellitus, uncontrolled ([place]) 04/12/2011  . Hypothyroid 04/12/2011  . Gait abnormality 06/22/2010  . Tibial tendinitis, posterior 06/22/2010    Past Surgical History:  Procedure Laterality Date  . AXILLARY HIDRADENITIS EXCISION Bilateral   . CHOLECYSTECTOMY    . HERNIA REPAIR     multiple  . INCISIONAL HERNIA REPAIR  05/30/2015   LAPROSCOPIC  . INCISIONAL HERNIA REPAIR N/A 05/30/2015   Procedure: LAPAROSCOPIC INCISIONAL HERNIA;  Surgeon:  Rolm Bookbinder, MD;  Location: [facility];  Service: General;  Laterality: N/A;  . INSERTION OF MESH N/A 05/30/2015   Procedure: INSERTION OF MESH;  Surgeon: Rolm Bookbinder, MD;  Location: [facility];  Service: General;  Laterality: N/A;  . LAPAROSCOPY N/A 05/30/2015   Procedure: LAPAROSCOPY DIAGNOSTIC;  Surgeon: Rolm Bookbinder, MD;  Location: [facility];  Service: General;  Laterality: N/A;  . TONSILECTOMY, ADENOIDECTOMY, BILATERAL MYRINGOTOMY AND TUBES      OB History    No data available       Home Medications    Prior to Admission medications   Medication Sig Start Date End Date Taking? Authorizing Provider  acetaminophen (TYLENOL) 500 MG tablet Take 2 tablets (1,000 mg total) by mouth every 8 (eight) hours as needed for mild pain or moderate pain. Take 2 tabs every 8 hours. Patient taking differently: Take 1,000 mg by mouth 2 (two) times daily as needed for mild pain or moderate pain. Take 2 tabs every 8 hours. 02/06/15   Tereasa Coop, PA-C  allopurinol (ZYLOPRIM) 300 MG tablet Take 300 mg by mouth daily.    Historical Provider, MD  baclofen (LIORESAL) 10 MG tablet Take 10 mg by mouth daily as needed for muscle spasms.  10/08/12   Historical Provider, MD  benzonatate (TESSALON) 200 MG capsule Take 1 capsule (200 mg total) by mouth 3 (three) times  daily as needed for cough. 02/10/16   Billy Fischer, MD  Blood Glucose Monitoring Suppl (BLOOD GLUCOSE METER KIT AND SUPPLIES) Dispense based on patient and insurance preference. To check blood sugars daily FOR ICD-9 250.00 08/31/13   Darlyne Russian, MD  cetirizine (ZYRTEC) 10 MG tablet Take 10 mg by mouth daily as needed for allergies.     Historical Provider, MD  clobetasol cream (TEMOVATE) 6.65 % Apply 1 application topically daily as needed (for eczema).  10/07/12   Historical Provider, MD  dicyclomine (BENTYL) 20 MG tablet Take 1 tablet (20 mg total) by mouth 2 (two) times daily. 01/26/16   Jerene Bears, MD  famotidine (PEPCID) 20 MG tablet Take 20 mg  by mouth daily.     Historical Provider, MD  INVOKAMET 150-500 MG TABS Take 1 tablet by mouth daily. Once daily with dinner 04/21/15   Historical Provider, MD  ipratropium (ATROVENT) 0.06 % nasal spray Place 2 sprays into both nostrils 4 (four) times daily. 02/10/16   Billy Fischer, MD  levothyroxine (SYNTHROID, LEVOTHROID) 137 MCG tablet Take 1 tablet (137 mcg total) by mouth daily before breakfast. PATIENT NEEDS OFFICE VISIT/LABS FOR ADDITIONAL REFILLS 02/05/13   Theda Sers, PA-C  metFORMIN (GLUCOPHAGE) 1000 MG tablet 2 (two) times daily. 01/16/16   Historical Provider, MD  naproxen sodium (ANAPROX) 220 MG tablet Take 220 mg by mouth daily as needed (for pain).     Historical Provider, MD  norgestimate-ethinyl estradiol (ORTHO-CYCLEN,SPRINTEC,PREVIFEM) 0.25-35 MG-MCG tablet Take 1 tablet by mouth daily.    Historical Provider, MD  omeprazole (PRILOSEC) 20 MG capsule TAKE 1 CAPSULE (20 MG TOTAL) BY MOUTH DAILY. Patient taking differently: TAKE 1 CAPSULE (20 MG TOTAL) BY MOUTH DAILY AS NEEDED for acid reflux 08/01/14   Jerene Bears, MD  ONE TOUCH ULTRA TEST test strip USE TO CHECK BLOOD SUGAR ONCE DAILY (ICD CODE:25.00) 05/08/13   Mancel Bale, PA-C  predniSONE (DELTASONE) 50 MG tablet 1 tab daily for 2 days then 1/2 tab daily for 2 days. 02/10/16   Billy Fischer, MD  sitaGLIPtin (JANUVIA) 100 MG tablet Take 1 tablet (100 mg total) by mouth daily. 10/21/11   Darlyne Russian, MD    Family History Family History  Problem Relation Age of Onset  . Thyroid cancer Mother   . Diabetes type II Mother   . High Cholesterol Mother   . Heart disease Mother   . Diabetes type II Father   . Hypertension Father   . Stroke Father     Social History Social History  Substance Use Topics  . Smoking status: Never Smoker  . Smokeless tobacco: Never Used  . Alcohol use No     Allergies   Cefuroxime; Cefuroxime axetil; and Sulfa antibiotics   Review of Systems Review of Systems  Constitutional: Negative.   Negative for fever.  HENT: Positive for congestion, postnasal drip and rhinorrhea.   Respiratory: Positive for cough. Negative for shortness of breath and wheezing.   Cardiovascular: Negative.  Negative for chest pain.  Gastrointestinal: Negative.   All other systems reviewed and are negative.    Physical Exam Triage Vital Signs ED Triage Vitals  Enc Vitals Group     BP      Pulse      Resp      Temp      Temp src      SpO2      Weight      Height  Head Circumference      Peak Flow      Pain Score      Pain Loc      Pain Edu?      Excl. in [place]?    No data found.   Updated Vital Signs LMP 01/14/2016   Visual Acuity Right Eye Distance:   Left Eye Distance:   Bilateral Distance:    Right Eye Near:   Left Eye Near:    Bilateral Near:     Physical Exam  Constitutional: She is oriented to person, place, and time. She appears well-developed and well-nourished.  HENT:  Right Ear: External ear normal.  Left Ear: External ear normal.  Nose: Nose normal.  Mouth/Throat: Oropharynx is clear and moist.  Eyes: Conjunctivae and EOM are normal. Pupils are equal, round, and reactive to light.  Neck: Normal range of motion. Neck supple.  Cardiovascular: Normal rate, regular rhythm, normal heart sounds and intact distal pulses.   Pulmonary/Chest: Effort normal and breath sounds normal. She has no wheezes.  Lymphadenopathy:    She has no cervical adenopathy.  Neurological: She is alert and oriented to person, place, and time.  Skin: Skin is warm and dry.  Nursing note and vitals reviewed.    UC Treatments / Results  Labs (all labs ordered are listed, but only abnormal results are displayed) Labs Reviewed - No data to display  EKG  EKG Interpretation None       Radiology No results found. X-rays reviewed and report per radiologist.  Procedures Procedures (including critical care time)  Medications Ordered in UC Medications - No data to  display   Initial Impression / Assessment and Plan / UC Course  I have reviewed the triage vital signs and the nursing notes.  Pertinent labs & imaging results that were available during my care of the patient were reviewed by me and considered in my medical decision making (see chart for details).       Final Clinical Impressions(s) / UC Diagnoses   Final diagnoses:  Viral URI with cough    New Prescriptions Discharge Medication List as of 02/10/2016  5:47 PM    START taking these medications   Details  ipratropium (ATROVENT) 0.06 % nasal spray Place 2 sprays into both nostrils 4 (four) times daily., Starting Sat 02/10/2016, Print    predniSONE (DELTASONE) 50 MG tablet 1 tab daily for 2 days then 1/2 tab daily for 2 days., Print         Billy Fischer, MD 03/03/16 [phone]

## 2016-02-10 NOTE — ED Triage Notes (Signed)
Cough  X   3   Weeks     Seen  In   Er    approx   2  Weeks  Ago  Had       [employer]      Done   At  That  Time    cough   Got    Cough productive  Today

## 2016-05-17 ENCOUNTER — Other Ambulatory Visit: Payer: Self-pay | Admitting: Internal Medicine

## 2016-05-28 ENCOUNTER — Telehealth: Payer: Self-pay | Admitting: Internal Medicine

## 2016-05-28 NOTE — Telephone Encounter (Signed)
Pts mother calling stating the pts insurance will cover xifaxan twice a year and she thinks April is when it is covered. Would like refill on xifaxan. Please advise.

## 2016-05-28 NOTE — Telephone Encounter (Signed)
Ok to refill rifaximin 550 mg TID x 14 days

## 2016-05-28 NOTE — Telephone Encounter (Signed)
Order sent to [facility].

## 2016-11-12 ENCOUNTER — Telehealth: Payer: Self-pay | Admitting: *Deleted

## 2016-11-12 NOTE — Telephone Encounter (Signed)
Would you like pt to take another course of Xifaxin? Please advise. Last seen 01-2016. Last refills given 05-2016.

## 2016-11-12 NOTE — Telephone Encounter (Signed)
Patient's mom sent the following message via the [facility] website:  Naevia needs a refill of her Xifaxin from Dr. Germain Osgood. Patient has down syndrome.  If you speak to her mother, please let her know to use MyChart and not the website for refill requests. Thanks

## 2016-11-13 MED ORDER — RIFAXIMIN 550 MG PO TABS: 550.0000 mg | ORAL_TABLET | Freq: Three times a day (TID) | ORAL | 0 refills | Status: AC

## 2016-11-13 NOTE — Telephone Encounter (Signed)
Dr Hilarie Fredrickson, please advise... Per your last office note in 01/2016, she needed follow up in 3 months. She hasnt been seen in nearly a year. Do you want patient to have refill of xifaxan or come to office first?

## 2016-11-13 NOTE — Telephone Encounter (Signed)
Okay to refill her rifaximin, she's had excellent response to this in the past Does need follow-up at some point, nonurgent, for continuity

## 2016-11-13 NOTE — Telephone Encounter (Signed)
I have sent xifaxan to [facility] and have scheduled patient for a follow up with Dr Hilarie Fredrickson on 01/14/17 at 1:45 pm. Patient's mother verbalizes understanding. I have also asked that she use MyChart as a quicker way to contact us in the future regarding her medications.

## 2016-12-09 ENCOUNTER — Encounter: Payer: Self-pay | Admitting: Physical Therapy

## 2016-12-09 ENCOUNTER — Ambulatory Visit: Payer: Medicare Other | Attending: Specialist | Admitting: Physical Therapy

## 2016-12-09 DIAGNOSIS — R262 Difficulty in walking, not elsewhere classified: Secondary | ICD-10-CM

## 2016-12-09 DIAGNOSIS — M25661 Stiffness of right knee, not elsewhere classified: Secondary | ICD-10-CM

## 2016-12-09 DIAGNOSIS — R600 Localized edema: Secondary | ICD-10-CM | POA: Diagnosis present

## 2016-12-09 DIAGNOSIS — M25561 Pain in right knee: Secondary | ICD-10-CM | POA: Diagnosis not present

## 2016-12-09 NOTE — Therapy (Signed)
[address] [place], [place], [address] Phone: [phone]   Fax:  [phone]  Physical Therapy Evaluation  Patient Details  Name: Wanda Oneill MRN: [id number] Date of Birth: 02-20-1980 Referring Provider: Jomarie Longs   Encounter Date: 12/09/2016  PT End of Session - 12/09/16 1455    Visit Number  1    Date for PT Re-Evaluation  02/08/17    PT Start Time  1440    PT Stop Time  1535    PT Time Calculation (min)  55 min    Activity Tolerance  Patient tolerated treatment well    Behavior During Therapy  [facility] for tasks assessed/performed       Past Medical History:  Diagnosis Date  . Diabetes mellitus    type II  . Down syndrome   . Family history of adverse reaction to anesthesia    mom has had n/v  . Gastritis   . GERD (gastroesophageal reflux disease)   . Headache   . Hepatic steatosis   . Hidradenitis   . Hypothyroidism   . Irritable bowel syndrome   . Periumbilical hernia   . Pneumonia   . Restless legs   . Tendonitis    chronic in left foot  . Thyroid disease    hypothyroidism    Past Surgical History:  Procedure Laterality Date  . AXILLARY HIDRADENITIS EXCISION Bilateral   . CHOLECYSTECTOMY    . HERNIA REPAIR     multiple  . INCISIONAL HERNIA REPAIR  05/30/2015   LAPROSCOPIC  . TONSILECTOMY, ADENOIDECTOMY, BILATERAL MYRINGOTOMY AND TUBES      There were no vitals filed for this visit.   Subjective Assessment - 12/09/16 1436    Subjective  Patient reports that she had a right knee scope on 11/19/16.  They did a chondroplasty and a lateral meniscectomy.  She used a walker for about a week.      Limitations  Standing;Walking    Patient Stated Goals  less pain, easier walking    Currently in Pain?  Yes    Pain Score  6     Pain Location  Knee    Pain Orientation  Right    Pain Descriptors / Indicators  Sore    Pain Type  Surgical pain;Acute pain    Pain Onset  1 to 4 weeks  ago    Pain Frequency  Constant    Aggravating Factors   standing/walking, stairs pain can be up to 8/10    Pain Relieving Factors  rest, ice , Tylenol can help at best a 3/10    Effect of Pain on Daily Activities  some difficulty walking, standing and with stairs         [facility] PT Assessment - 12/09/16 0001      Assessment   Medical Diagnosis  right knee scope with lateral meniscectomy    Referring Provider  RA Collins    Onset Date/Surgical Date  11/19/16    Prior Therapy  no      Precautions   Precautions  None      Balance Screen   Has the patient fallen in the past 6 months  No    Has the patient had a decrease in activity level because of a fear of falling?   No    Is the patient reluctant to leave their home because of a fear of falling?   No      Home  Environment   Additional Comments  a few steps, has a ramp, does cooking and cleaning      Prior Function   Level of Independence  Independent    Vocation  On disability    Leisure  no exercise      ROM / Strength   AROM / PROM / Strength  AROM;Strength;PROM      AROM   AROM Assessment Site  Knee    Right/Left Knee  Right    Right Knee Extension  0    Right Knee Flexion  90      PROM   PROM Assessment Site  Knee    Right/Left Knee  Right    Right Knee Extension  0    Right Knee Flexion  104      Strength   Overall Strength Comments  right knee 4-/5 with some pain      Palpation   Palpation comment  some tenderness over the scars and in the posterior knee, she has edema of the knee area, there is mild warmth      Ambulation/Gait   Gait Comments  mild antalgic gait, right toe turned out, she goes down stairs one at a time, and can go up step over step, reports that for shopping now she has to use an electric cart             Objective measurements completed on examination: See above findings.      [facility] Adult PT Treatment/Exercise - 12/09/16 0001      Exercises   Exercises  Knee/Hip       Knee/Hip Exercises: Aerobic   Nustep  level 4 x 5 minutes      Modalities   Modalities  Vasopneumatic      Vasopneumatic   Number Minutes Vasopneumatic   15 minutes    Vasopnuematic Location   Knee    Vasopneumatic Pressure  Medium    Vasopneumatic Temperature   37             PT Education - 12/09/16 1453    Education provided  Yes    Education Details  Heel slides, quad sets, SAQ's    Person(s) Educated  Patient    Methods  Explanation;Demonstration;Handout    Comprehension  Verbalized understanding       PT Short Term Goals - 12/09/16 1501      PT SHORT TERM GOAL #1   Title  independent with initial HEP    Time  2    Period  Weeks    Status  New        PT Long Term Goals - 12/09/16 1501      PT LONG TERM GOAL #1   Title  understand RICE    Time  8    Period  Weeks    Status  New      PT LONG TERM GOAL #2   Title  dcrease pain 50%    Time  8    Period  Weeks    Status  New      PT LONG TERM GOAL #3   Title  increase AROM of knee flexion to 115 degrees    Time  8    Period  Weeks    Status  New      PT LONG TERM GOAL #4   Title  go down stairs step over step    Time  8    Period  Weeks  Status  New             Plan - 12/09/16 1458    Clinical Impression Statement  Patient underwent a right knee scope with chondroplasy and lateral meniscectomy on 11/19/16.  She reports that she is "sore" rates pain a 6/10.  She has antalgic gait, toe out gait, and goes down stairs step over step, she has some swelling and tenderness in the right knee    History and Personal Factors relevant to plan of care:  Down's    Clinical Presentation  Stable    Clinical Decision Making  Low    Rehab Potential  Good    PT Frequency  2x / week    PT Duration  8 weeks    PT Treatment/Interventions  ADLs/Self Care Home Management;Cryotherapy;Electrical Stimulation;Gait training;Stair training;Functional mobility training;Therapeutic activities;Therapeutic  exercise;Balance training;Patient/family education;Manual techniques;Vasopneumatic Device    PT Next Visit Plan  slowly add exercises, treat edema and pain as needed    Consulted and Agree with Plan of Care  Patient       Patient will benefit from skilled therapeutic intervention in order to improve the following deficits and impairments:  Abnormal gait, Decreased activity tolerance, Decreased balance, Difficulty walking, Increased edema, Decreased range of motion, Decreased strength, Decreased mobility  Visit Diagnosis: Acute pain of right knee - Plan: PT plan of care cert/re-cert  Stiffness of right knee, not elsewhere classified - Plan: PT plan of care cert/re-cert  Localized edema - Plan: PT plan of care cert/re-cert  Difficulty in walking, not elsewhere classified - Plan: PT plan of care cert/re-cert  G-Codes - 82/50/03 1503    Functional Assessment Tool Used (Outpatient Only)  foto 72% limitation    Functional Limitation  Mobility: Walking and moving around    Mobility: Walking and Moving Around Current Status (B0488)  At least 60 percent but less than 80 percent impaired, limited or restricted    Mobility: Walking and Moving Around Goal Status [phone])  At least 40 percent but less than 60 percent impaired, limited or restricted        Problem List Patient Active Problem List   Diagnosis Date Noted  . Incarcerated epigastric hernia 05/30/2015  . Left foot pain 08/09/2014  . Chronic diarrhea 07/27/2014  . Gout 08/10/2012  . Acid reflux 01/22/2012  . Down syndrome 11/11/2011  . Hernia of abdominal wall 10/21/2011  . Psoriasis 10/21/2011  . Type 2 diabetes mellitus, uncontrolled ([place]) 04/12/2011  . Hypothyroid 04/12/2011  . Gait abnormality 06/22/2010  . Tibial tendinitis, posterior 06/22/2010    Sumner Boast., PT 12/09/2016, 3:07 PM  [address] [place], [place], [address] Phone:  [phone]   Fax:  [phone]  Name: Wanda Oneill MRN: [id number] Date of Birth: [birth year]

## 2016-12-11 ENCOUNTER — Encounter: Payer: Self-pay | Admitting: Physical Therapy

## 2016-12-11 ENCOUNTER — Ambulatory Visit: Payer: Medicare Other | Admitting: Physical Therapy

## 2016-12-11 DIAGNOSIS — R600 Localized edema: Secondary | ICD-10-CM

## 2016-12-11 DIAGNOSIS — M25561 Pain in right knee: Secondary | ICD-10-CM | POA: Diagnosis not present

## 2016-12-11 DIAGNOSIS — R262 Difficulty in walking, not elsewhere classified: Secondary | ICD-10-CM

## 2016-12-11 DIAGNOSIS — M25661 Stiffness of right knee, not elsewhere classified: Secondary | ICD-10-CM

## 2016-12-11 NOTE — Therapy (Signed)
[place] [address] [place], [place], [address] Phone: [phone]   Fax:  [phone]  Physical Therapy Treatment  Patient Details  Name: Wanda Oneill MRN: [id number] Date of Birth: November 15, 1980 Referring Provider: Jomarie Longs   Encounter Date: 12/11/2016  PT End of Session - 12/11/16 1640    Visit Number  2    Date for PT Re-Evaluation  02/08/17    PT Start Time  3888    PT Stop Time  1650    PT Time Calculation (min)  49 min    Activity Tolerance  Patient tolerated treatment well    Behavior During Therapy  [facility] for tasks assessed/performed       Past Medical History:  Diagnosis Date  . Diabetes mellitus    type II  . Down syndrome   . Family history of adverse reaction to anesthesia    mom has had n/v  . Gastritis   . GERD (gastroesophageal reflux disease)   . Headache   . Hepatic steatosis   . Hidradenitis   . Hypothyroidism   . Irritable bowel syndrome   . Periumbilical hernia   . Pneumonia   . Restless legs   . Tendonitis    chronic in left foot  . Thyroid disease    hypothyroidism    Past Surgical History:  Procedure Laterality Date  . AXILLARY HIDRADENITIS EXCISION Bilateral   . CHOLECYSTECTOMY    . HERNIA REPAIR     multiple  . INCISIONAL HERNIA REPAIR  05/30/2015   LAPROSCOPIC  . TONSILECTOMY, ADENOIDECTOMY, BILATERAL MYRINGOTOMY AND TUBES      There were no vitals filed for this visit.  Subjective Assessment - 12/11/16 1603    Subjective  Pt reports that she is doing ok    Currently in Pain?  Yes    Pain Score  5     Pain Location  Knee    Pain Orientation  Right                      [facility] Adult PT Treatment/Exercise - 12/11/16 0001      High Level Balance   High Level Balance Activities  Side stepping;Backward walking      Knee/Hip Exercises: Aerobic   Nustep  level 4 x 5 minutes      Knee/Hip Exercises: Standing   Forward Step Up  Right;5 reps;Hand  Hold: 1;Step Height: 4";3 sets    Other Standing Knee Exercises  Standing march x10       Knee/Hip Exercises: Seated   Long Arc Quad  2 sets;Both;10 reps;Weights    Long Arc Quad Weight  2 lbs.    Hamstring Curl  2 sets;Both;Strengthening;10 reps;Weights    Hamstring Limitations  red tband    Sit to Sand  1 set;10 reps;with UE support      Modalities   Modalities  Cryotherapy      Cryotherapy   Number Minutes Cryotherapy  10 Minutes    Cryotherapy Location  Knee    Type of Cryotherapy  Ice pack               PT Short Term Goals - 12/11/16 1643      PT SHORT TERM GOAL #1   Title  independent with initial HEP    Status  Achieved        PT Long Term Goals - 12/11/16 1643      PT LONG  TERM GOAL #1   Title  understand RICE    Status  Partially Met      PT LONG TERM GOAL #2   Title  dcrease pain 50%    Status  On-going      PT LONG TERM GOAL #3   Title  increase AROM of knee flexion to 115 degrees            Plan - 12/11/16 1643    Clinical Impression Statement  Pt reports that he is doing better overall. She does stated some soreness. No issues with today's exercises, does report little pain in R knee with hamstring curls. Pt has a very slight antalgic gait.     Rehab Potential  Good    PT Frequency  2x / week    PT Duration  8 weeks    PT Treatment/Interventions  ADLs/Self Care Home Management;Cryotherapy;Electrical Stimulation;Gait training;Stair training;Functional mobility training;Therapeutic activities;Therapeutic exercise;Balance training;Patient/family education;Manual techniques;Vasopneumatic Device    PT Next Visit Plan  slowly add exercises, treat edema and pain as needed       Patient will benefit from skilled therapeutic intervention in order to improve the following deficits and impairments:  Abnormal gait, Decreased activity tolerance, Decreased balance, Difficulty walking, Increased edema, Decreased range of motion, Decreased strength,  Decreased mobility  Visit Diagnosis: Acute pain of right knee  Localized edema  Difficulty in walking, not elsewhere classified  Stiffness of right knee, not elsewhere classified     Problem List Patient Active Problem List   Diagnosis Date Noted  . Incarcerated epigastric hernia 05/30/2015  . Left foot pain 08/09/2014  . Chronic diarrhea 07/27/2014  . Gout 08/10/2012  . Acid reflux 01/22/2012  . Down syndrome 11/11/2011  . Hernia of abdominal wall 10/21/2011  . Psoriasis 10/21/2011  . Type 2 diabetes mellitus, uncontrolled ([place]) 04/12/2011  . Hypothyroid 04/12/2011  . Gait abnormality 06/22/2010  . Tibial tendinitis, posterior 06/22/2010    Scot Jun, PTA 12/11/2016, 4:46 PM  [place] [address], [place], [address] Phone: [phone]   Fax:  [phone]  Name: Wanda Oneill MRN: [id number] Date of Birth: Dec 05, 1980

## 2016-12-16 ENCOUNTER — Encounter: Payer: Self-pay | Admitting: Physical Therapy

## 2016-12-16 ENCOUNTER — Ambulatory Visit: Payer: Medicare Other | Admitting: Physical Therapy

## 2016-12-16 DIAGNOSIS — M25561 Pain in right knee: Secondary | ICD-10-CM

## 2016-12-16 DIAGNOSIS — R262 Difficulty in walking, not elsewhere classified: Secondary | ICD-10-CM

## 2016-12-16 DIAGNOSIS — M25661 Stiffness of right knee, not elsewhere classified: Secondary | ICD-10-CM

## 2016-12-16 DIAGNOSIS — R600 Localized edema: Secondary | ICD-10-CM

## 2016-12-16 NOTE — Therapy (Signed)
[address] [place], [place], [address] Phone: [phone]   Fax:  [phone]  Physical Therapy Treatment  Patient Details  Name: Wanda Oneill MRN: [id number] Date of Birth: 01/29/1981 Referring Provider: Jomarie Longs   Encounter Date: 12/16/2016  PT End of Session - 12/16/16 1436    Visit Number  3    Date for PT Re-Evaluation  02/08/17    PT Start Time  1400    PT Stop Time  1455    PT Time Calculation (min)  55 min    Activity Tolerance  Patient tolerated treatment well    Behavior During Therapy  [facility] for tasks assessed/performed       Past Medical History:  Diagnosis Date  . Diabetes mellitus    type II  . Down syndrome   . Family history of adverse reaction to anesthesia    mom has had n/v  . Gastritis   . GERD (gastroesophageal reflux disease)   . Headache   . Hepatic steatosis   . Hidradenitis   . Hypothyroidism   . Irritable bowel syndrome   . Periumbilical hernia   . Pneumonia   . Restless legs   . Tendonitis    chronic in left foot  . Thyroid disease    hypothyroidism    Past Surgical History:  Procedure Laterality Date  . AXILLARY HIDRADENITIS EXCISION Bilateral   . CHOLECYSTECTOMY    . HERNIA REPAIR     multiple  . INCISIONAL HERNIA REPAIR  05/30/2015   LAPROSCOPIC  . TONSILECTOMY, ADENOIDECTOMY, BILATERAL MYRINGOTOMY AND TUBES      There were no vitals filed for this visit.  Subjective Assessment - 12/16/16 1358    Subjective  Patient reports that she is sore, but it is getting better.    Currently in Pain?  Yes    Pain Score  4     Pain Location  Knee    Pain Orientation  Right    Pain Descriptors / Indicators  Sore    Aggravating Factors   standing, walking    Pain Relieving Factors  rest                      [facility] Adult PT Treatment/Exercise - 12/16/16 0001      High Level Balance   High Level Balance Activities  Side stepping;Backward  walking    High Level Balance Comments  ball kicks to get her moving some      Knee/Hip Exercises: Aerobic   Recumbent Bike  L0 x 5 minutes    Nustep  level 4 x 6minutes      Knee/Hip Exercises: Machines for Strengthening   Cybex Knee Extension  tried 5# but reported "too much"    Cybex Knee Flexion  20# 2x15      Knee/Hip Exercises: Seated   Long Arc Quad  2 sets;Both;10 reps;Weights    Long Arc Quad Weight  2 lbs.    Hamstring Curl  2 sets;Both;Strengthening;10 reps;Weights    Hamstring Limitations  red tband      Knee/Hip Exercises: Supine   Short Arc Quad Sets  Right;2 sets;10 reps 2.5#    Bridges with Diona Foley Squeeze  2 sets;10 reps    Straight Leg Raise with External Rotation  2 sets;10 reps;Right      Vasopneumatic   Number Minutes Vasopneumatic   15 minutes    Vasopnuematic Location   Knee  Vasopneumatic Pressure  Medium    Vasopneumatic Temperature   37               PT Short Term Goals - 12/11/16 1643      PT SHORT TERM GOAL #1   Title  independent with initial HEP    Status  Achieved        PT Long Term Goals - 12/16/16 1437      PT LONG TERM GOAL #1   Title  understand RICE    Status  Achieved            Plan - 12/16/16 1436    Clinical Impression Statement  Patient very weak in the quads and the glutes.  Needs cues and encouragement    PT Next Visit Plan  continue to add exercises    Consulted and Agree with Plan of Care  Patient       Patient will benefit from skilled therapeutic intervention in order to improve the following deficits and impairments:  Abnormal gait, Decreased activity tolerance, Decreased balance, Difficulty walking, Increased edema, Decreased range of motion, Decreased strength, Decreased mobility  Visit Diagnosis: Acute pain of right knee  Localized edema  Difficulty in walking, not elsewhere classified  Stiffness of right knee, not elsewhere classified     Problem List Patient Active Problem List    Diagnosis Date Noted  . Incarcerated epigastric hernia 05/30/2015  . Left foot pain 08/09/2014  . Chronic diarrhea 07/27/2014  . Gout 08/10/2012  . Acid reflux 01/22/2012  . Down syndrome 11/11/2011  . Hernia of abdominal wall 10/21/2011  . Psoriasis 10/21/2011  . Type 2 diabetes mellitus, uncontrolled ([place]) 04/12/2011  . Hypothyroid 04/12/2011  . Gait abnormality 06/22/2010  . Tibial tendinitis, posterior 06/22/2010    Sumner Boast., PT 12/16/2016, 2:37 PM  [place] [address] [place], [place], [address] Phone: [phone]   Fax:  [phone]  Name: BRYN SALINE MRN: [id number] Date of Birth: 05/30/1980

## 2016-12-19 ENCOUNTER — Encounter: Payer: Medicare Other | Admitting: Physical Therapy

## 2016-12-31 ENCOUNTER — Ambulatory Visit: Payer: Medicare Other | Admitting: Physical Therapy

## 2016-12-31 ENCOUNTER — Encounter: Payer: Self-pay | Admitting: Physical Therapy

## 2016-12-31 DIAGNOSIS — R600 Localized edema: Secondary | ICD-10-CM

## 2016-12-31 DIAGNOSIS — R262 Difficulty in walking, not elsewhere classified: Secondary | ICD-10-CM

## 2016-12-31 DIAGNOSIS — M25561 Pain in right knee: Secondary | ICD-10-CM | POA: Diagnosis not present

## 2016-12-31 DIAGNOSIS — M25661 Stiffness of right knee, not elsewhere classified: Secondary | ICD-10-CM

## 2016-12-31 NOTE — Therapy (Signed)
[place] [address] [place], [place], [address] Phone: [phone]   Fax:  [phone]  Physical Therapy Treatment  Patient Details  Name: Wanda Oneill MRN: [id number] Date of Birth: 1980/12/01 Referring Provider: Jomarie Longs   Encounter Date: 12/31/2016  PT End of Session - 12/31/16 1602    Visit Number  4    Date for PT Re-Evaluation  02/08/17    PT Start Time  8099    PT Stop Time  1615    PT Time Calculation (min)  45 min    Activity Tolerance  Patient limited by pain    Behavior During Therapy  [facility] for tasks assessed/performed       Past Medical History:  Diagnosis Date  . Diabetes mellitus    type II  . Down syndrome   . Family history of adverse reaction to anesthesia    mom has had n/v  . Gastritis   . GERD (gastroesophageal reflux disease)   . Headache   . Hepatic steatosis   . Hidradenitis   . Hypothyroidism   . Irritable bowel syndrome   . Periumbilical hernia   . Pneumonia   . Restless legs   . Tendonitis    chronic in left foot  . Thyroid disease    hypothyroidism    Past Surgical History:  Procedure Laterality Date  . AXILLARY HIDRADENITIS EXCISION Bilateral   . CHOLECYSTECTOMY    . HERNIA REPAIR     multiple  . INCISIONAL HERNIA REPAIR  05/30/2015   LAPROSCOPIC  . INCISIONAL HERNIA REPAIR N/A 05/30/2015   Procedure: LAPAROSCOPIC INCISIONAL HERNIA;  Surgeon: Rolm Bookbinder, MD;  Location: [facility];  Service: General;  Laterality: N/A;  . INSERTION OF MESH N/A 05/30/2015   Procedure: INSERTION OF MESH;  Surgeon: Rolm Bookbinder, MD;  Location: [facility];  Service: General;  Laterality: N/A;  . LAPAROSCOPY N/A 05/30/2015   Procedure: LAPAROSCOPY DIAGNOSTIC;  Surgeon: Rolm Bookbinder, MD;  Location: [facility];  Service: General;  Laterality: N/A;  . TONSILECTOMY, ADENOIDECTOMY, BILATERAL MYRINGOTOMY AND TUBES      There were no vitals filed for this visit.  Subjective Assessment  - 12/31/16 1536    Subjective  Patient reports that she has been sore, has a higher rating of pain.  Reports that she ices sometimes at home    Currently in Pain?  Yes    Pain Score  7     Pain Location  Knee    Pain Orientation  Right    Aggravating Factors   walking                      [facility] Adult PT Treatment/Exercise - 12/31/16 0001      Knee/Hip Exercises: Aerobic   Recumbent Bike  L0 x 5 minutes    Nustep  level 4 x 64minutes      Knee/Hip Exercises: Supine   Short Arc Quad Sets  Right;2 sets;10 reps 2.5#    Bridges with Ball Squeeze  2 sets;10 reps    Straight Leg Raise with External Rotation  2 sets;10 reps;Right      Vasopneumatic   Number Minutes Vasopneumatic   15 minutes    Vasopnuematic Location   Knee    Vasopneumatic Pressure  Medium    Vasopneumatic Temperature   37               PT Short Term Goals - 12/11/16 1643  PT SHORT TERM GOAL #1   Title  independent with initial HEP    Status  Achieved        PT Long Term Goals - 12/31/16 1603      PT LONG TERM GOAL #2   Title  dcrease pain 50%    Status  On-going      PT LONG TERM GOAL #4   Title  go down stairs step over step    Status  On-going            Plan - 12/31/16 1603    Clinical Impression Statement  Patient reporting some increased pain in the knee, decreased some of the exercises, she is very weak and deconditioned requiring rest periodically       Patient will benefit from skilled therapeutic intervention in order to improve the following deficits and impairments:  Abnormal gait, Decreased activity tolerance, Decreased balance, Difficulty walking, Increased edema, Decreased range of motion, Decreased strength, Decreased mobility  Visit Diagnosis: Acute pain of right knee  Localized edema  Difficulty in walking, not elsewhere classified  Stiffness of right knee, not elsewhere classified     Problem List Patient Active Problem List   Diagnosis  Date Noted  . Incarcerated epigastric hernia 05/30/2015  . Left foot pain 08/09/2014  . Chronic diarrhea 07/27/2014  . Gout 08/10/2012  . Acid reflux 01/22/2012  . Down syndrome 11/11/2011  . Hernia of abdominal wall 10/21/2011  . Psoriasis 10/21/2011  . Type 2 diabetes mellitus, uncontrolled ([place]) 04/12/2011  . Hypothyroid 04/12/2011  . Gait abnormality 06/22/2010  . Tibial tendinitis, posterior 06/22/2010    Sumner Boast., PT 12/31/2016, 4:04 PM  [place] [address] [place], [place], [address] Phone: [phone]   Fax:  [phone]  Name: Wanda Oneill MRN: [id number] Date of Birth: 01/17/1981

## 2017-01-01 ENCOUNTER — Ambulatory Visit: Payer: Medicare Other | Admitting: Physical Therapy

## 2017-01-08 ENCOUNTER — Encounter: Payer: Self-pay | Admitting: Physical Therapy

## 2017-01-08 ENCOUNTER — Ambulatory Visit: Payer: Medicare Other | Attending: Specialist | Admitting: Physical Therapy

## 2017-01-08 DIAGNOSIS — M25661 Stiffness of right knee, not elsewhere classified: Secondary | ICD-10-CM | POA: Diagnosis present

## 2017-01-08 DIAGNOSIS — R600 Localized edema: Secondary | ICD-10-CM | POA: Diagnosis present

## 2017-01-08 DIAGNOSIS — M25561 Pain in right knee: Secondary | ICD-10-CM

## 2017-01-08 DIAGNOSIS — R262 Difficulty in walking, not elsewhere classified: Secondary | ICD-10-CM

## 2017-01-08 NOTE — Therapy (Addendum)
[address] [place], [place], [address] Phone: [phone]   Fax:  [phone]  Physical Therapy Treatment  Patient Details  Name: Wanda Oneill MRN: [id number] Date of Birth: 04/27/1980 Referring Provider: Jomarie Longs   Encounter Date: 01/08/2017  PT End of Session - 01/08/17 1349    Visit Number  5    Date for PT Re-Evaluation  02/08/17    PT Start Time  1314    PT Stop Time  1410    PT Time Calculation (min)  56 min    Activity Tolerance  Patient tolerated treatment well    Behavior During Therapy  [facility] for tasks assessed/performed       Past Medical History:  Diagnosis Date  . Diabetes mellitus    type II  . Down syndrome   . Family history of adverse reaction to anesthesia    mom has had n/v  . Gastritis   . GERD (gastroesophageal reflux disease)   . Headache   . Hepatic steatosis   . Hidradenitis   . Hypothyroidism   . Irritable bowel syndrome   . Periumbilical hernia   . Pneumonia   . Restless legs   . Tendonitis    chronic in left foot  . Thyroid disease    hypothyroidism    Past Surgical History:  Procedure Laterality Date  . AXILLARY HIDRADENITIS EXCISION Bilateral   . CHOLECYSTECTOMY    . HERNIA REPAIR     multiple  . INCISIONAL HERNIA REPAIR  05/30/2015   LAPROSCOPIC  . INCISIONAL HERNIA REPAIR N/A 05/30/2015   Procedure: LAPAROSCOPIC INCISIONAL HERNIA;  Surgeon: Rolm Bookbinder, MD;  Location: [place];  Service: General;  Laterality: N/A;  . INSERTION OF MESH N/A 05/30/2015   Procedure: INSERTION OF MESH;  Surgeon: Rolm Bookbinder, MD;  Location: [place];  Service: General;  Laterality: N/A;  . LAPAROSCOPY N/A 05/30/2015   Procedure: LAPAROSCOPY DIAGNOSTIC;  Surgeon: Rolm Bookbinder, MD;  Location: [place];  Service: General;  Laterality: N/A;  . TONSILECTOMY, ADENOIDECTOMY, BILATERAL MYRINGOTOMY AND TUBES      There were no vitals filed for this visit.  Subjective  Assessment - 01/08/17 1314    Subjective  Patient reports that she was a little better, she reports that she thinks the more she is up walking and the colder weather does increase her soreness    Currently in Pain?  Yes    Pain Score  5     Pain Location  Knee    Pain Orientation  Right    Pain Descriptors / Indicators  Sore    Aggravating Factors   walking, colder weather         [facility] PT Assessment - 01/08/17 0001      AROM   Right Knee Extension  0    Right Knee Flexion  108                  [facility] Adult PT Treatment/Exercise - 01/08/17 0001      Knee/Hip Exercises: Aerobic   Recumbent Bike  L0 x 5 minutes    Nustep  level 4 x 79mnutes      Knee/Hip Exercises: Standing   Knee Flexion  2 sets;10 reps 3#    Hip Abduction  2 sets;10 reps    Abduction Limitations  2#      Knee/Hip Exercises: Seated   Long Arc Quad  2 sets;Both;10 reps;Weights    Long  Arc Quad Weight  2 lbs.    Ball Squeeze  20 reps    Marching  2 sets;10 reps    Marching Weights  2 lbs.    Hamstring Curl  2 sets;Both;Strengthening;10 reps;Weights    Hamstring Limitations  red tband      Knee/Hip Exercises: Supine   Short Arc Quad Sets  Right;2 sets;10 reps 4$    Bridges with [employer] Squeeze  2 sets;10 reps    Straight Leg Raise with External Rotation  2 sets;10 reps;Right      Vasopneumatic   Number Minutes Vasopneumatic   15 minutes    Vasopnuematic Location   Knee    Vasopneumatic Pressure  Medium    Vasopneumatic Temperature   37               PT Short Term Goals - 12/11/16 1643      PT SHORT TERM GOAL #1   Title  independent with initial HEP    Status  Achieved        PT Long Term Goals - 01/08/17 1351      PT LONG TERM GOAL #1   Title  understand RICE    Status  Achieved      PT LONG TERM GOAL #2   Title  dcrease pain 50%    Status  On-going      PT LONG TERM GOAL #3   Title  increase AROM of knee flexion to 115 degrees    Status  Partially Met      PT LONG  TERM GOAL #4   Title  go down stairs step over step    Status  On-going            Plan - 01/08/17 1349    Clinical Impression Statement  We have decreased frequency to 1x/week, we are working on knee and hip strength, she is weak in the hips.  She reports no pain with any exercises, reports difficukty walking for longer periods    PT Next Visit Plan  work on hips    Consulted and Agree with Plan of Care  Patient       Patient will benefit from skilled therapeutic intervention in order to improve the following deficits and impairments:  Abnormal gait, Decreased activity tolerance, Decreased balance, Difficulty walking, Increased edema, Decreased range of motion, Decreased strength, Decreased mobility  Visit Diagnosis: Acute pain of right knee  Localized edema  Difficulty in walking, not elsewhere classified  Stiffness of right knee, not elsewhere classified     Problem List Patient Active Problem List   Diagnosis Date Noted  . Incarcerated epigastric hernia 05/30/2015  . Left foot pain 08/09/2014  . Chronic diarrhea 07/27/2014  . Gout 08/10/2012  . Acid reflux 01/22/2012  . Down syndrome 11/11/2011  . Hernia of abdominal wall 10/21/2011  . Psoriasis 10/21/2011  . Type 2 diabetes mellitus, uncontrolled ([place]) 04/12/2011  . Hypothyroid 04/12/2011  . Gait abnormality 06/22/2010  . Tibial tendinitis, posterior 06/22/2010  Spoke with patient's mom, reports that she feels Madhavi is doing great and would like to D/C.  Goals met   Sumner Boast., PT 01/08/2017, 1:53 PM  [place], [place], [address] Phone: [phone]   Fax:  [phone]  Name: PENNEY DOMANSKI MRN: [id number] Date of Birth: 07-20-1980

## 2017-01-14 ENCOUNTER — Ambulatory Visit: Payer: Medicare Other | Admitting: Internal Medicine

## 2017-01-15 ENCOUNTER — Ambulatory Visit: Payer: Medicare Other | Admitting: Physical Therapy

## 2017-01-22 ENCOUNTER — Ambulatory Visit: Payer: Medicare Other | Admitting: Physical Therapy

## 2017-02-27 ENCOUNTER — Other Ambulatory Visit: Payer: Self-pay

## 2017-02-27 ENCOUNTER — Encounter: Payer: Self-pay | Admitting: Family Medicine

## 2017-02-27 ENCOUNTER — Ambulatory Visit (INDEPENDENT_AMBULATORY_CARE_PROVIDER_SITE_OTHER): Payer: Medicare Other

## 2017-02-27 ENCOUNTER — Ambulatory Visit (INDEPENDENT_AMBULATORY_CARE_PROVIDER_SITE_OTHER): Payer: Medicare Other | Admitting: Family Medicine

## 2017-02-27 VITALS — BP 120/78 | HR 91 | Temp 98.7°F | Resp 18 | Ht 64.0 in | Wt 240.6 lb

## 2017-02-27 DIAGNOSIS — S99921A Unspecified injury of right foot, initial encounter: Secondary | ICD-10-CM | POA: Diagnosis not present

## 2017-02-27 DIAGNOSIS — Y92009 Unspecified place in unspecified non-institutional (private) residence as the place of occurrence of the external cause: Secondary | ICD-10-CM | POA: Diagnosis not present

## 2017-02-27 DIAGNOSIS — S8991XA Unspecified injury of right lower leg, initial encounter: Secondary | ICD-10-CM | POA: Diagnosis not present

## 2017-02-27 DIAGNOSIS — W1789XA Other fall from one level to another, initial encounter: Secondary | ICD-10-CM

## 2017-02-27 DIAGNOSIS — S93601A Unspecified sprain of right foot, initial encounter: Secondary | ICD-10-CM

## 2017-02-27 NOTE — Patient Instructions (Addendum)
Dg Knee Complete 4 Views Right  Result Date: 02/27/2017 CLINICAL DATA:  Right knee pain due to a fall today. Initial encounter. EXAM: RIGHT KNEE - COMPLETE 4+ VIEW COMPARISON:  None. FINDINGS: No evidence of fracture, dislocation, or joint effusion. No evidence of arthropathy or other focal bone abnormality. Soft tissues are unremarkable. IMPRESSION: Negative exam. Electronically Signed   By: Inge Rise M.D.   On: 02/27/2017 15:39   Dg Foot Complete Right  Result Date: 02/27/2017 CLINICAL DATA:  Tenderness and swelling. EXAM: RIGHT FOOT COMPLETE - 3+ VIEW COMPARISON:  No recent. FINDINGS: Diffuse degenerative change. Degenerative changes most prominent first metatarsophalangeal joint. No evidence of fracture dislocation. No acute bony abnormality. Diffuse soft tissue swelling. No radiopaque foreign body. IMPRESSION: 1.  Diffuse soft tissue swelling.  No radiopaque foreign body. 2. Diffuse degenerative change. Degenerative changes most prominent about the first metatarsophalangeal joint. No acute bony abnormality. Electronically Signed   By: Marcello Moores  Register   On: 02/27/2017 15:39       IF you received an x-ray today, you will receive an invoice from [facility]. Please contact [facility] at [phone] with questions or concerns regarding your invoice.   IF you received labwork today, you will receive an invoice from [facility]. Please contact [facility] at [phone] with questions or concerns regarding your invoice.   Our billing staff will not be able to assist you with questions regarding bills from these companies.  You will be contacted with the lab results as soon as they are available. The fastest way to get your results is to activate your My Chart account. Instructions are located on the last page of this paperwork. If you have not heard from [place] regarding the results in 2 weeks, please contact this office.    Foot Sprain A foot sprain is an injury to one of the  strong bands of tissue (ligaments) that connect and support the many bones in your feet. The ligament can be stretched too much or it can tear. A tear can be either partial or complete. The severity of the sprain depends on how much of the ligament was damaged or torn. What are the causes? A foot sprain is usually caused by suddenly twisting or pivoting your foot. What increases the risk? This injury is more likely to occur in people who:  Play a sport, such as basketball or football.  Exercise or play a sport without warming up.  Start a new workout or sport.  Suddenly increase how long or hard they exercise or play a sport.  What are the signs or symptoms? Symptoms of this condition start soon after an injury and include:  Pain, especially in the arch of the foot.  Bruising.  Swelling.  Inability to walk or use the foot to support body weight.  How is this diagnosed? This condition is diagnosed with a medical history and physical exam. You may also have imaging tests, such as:  X-rays to make sure there are no broken bones (fractures).  MRI to see if the ligament has torn.  How is this treated? Treatment varies depending on the severity of your sprain. Mild sprains can be treated with rest, ice, compression, and elevation (RICE). If your ligament is overstretched or partially torn, treatment usually involves keeping your foot in a fixed position (immobilization) for a period of time. To help you do this, your health care provider will apply a bandage, splint, or walking boot to keep your foot from moving until it  heals. You may also be advised to use crutches or a scooter for a few weeks to avoid bearing weight on your foot while it is healing. If your ligament is fully torn, you may need surgery to reconnect the ligament to the bone. After surgery, a cast or splint will be applied and will need to stay on your foot while it heals. Your health care provider may also suggest  exercises or physical therapy to strengthen your foot. Follow these instructions at home: If You Have a Bandage, Splint, or Walking Boot:  Wear it as directed by your health care provider. Remove it only as directed by your health care provider.  Loosen the bandage, splint, or walking boot if your toes become numb and tingle, or if they turn cold and blue. Bathing  If your health care provider approves bathing and showering, cover the bandage or splint with a watertight plastic bag to protect it from water. Do not let the bandage or splint get wet. Managing pain, stiffness, and swelling  If directed, apply ice to the injured area: ? Put ice in a plastic bag. ? Place a towel between your skin and the bag. ? Leave the ice on for 20 minutes, 2-3 times per day.  Move your toes often to avoid stiffness and to lessen swelling.  Raise (elevate) the injured area above the level of your heart while you are sitting or lying down. Driving  Do not drive or operate heavy machinery while taking pain medicine.  Ask your health care provider when it is safe to drive if you have a bandage, splint, or walking boot on your foot. Activity  Rest as directed by your health care provider.  Do not use the injured foot to support your body weight until your health care provider says that you can. Use crutches or other supportive devices as directed by your health care provider.  Ask your health care provider what activities are safe for you. Gradually increase how much and how far you walk until your health care provider says it is safe to return to full activity.  Do any exercise or physical therapy as directed by your health care provider. General instructions  If a splint was applied, do not put pressure on any part of it until it is fully hardened. This may take several hours.  Take medicines only as directed by your health care provider. These include over-the-counter medicines and prescription  medicines.  Keep all follow-up visits as directed by your health care provider. This is important.  When you can walk without pain, wear supportive shoes that have stiff soles. Do not wear flip-flops, and do not walk barefoot. Contact a health care provider if:  Your pain is not controlled with medicine.  Your bruising or swelling gets worse or does not get better with treatment.  Your splint or walking boot is damaged. Get help right away if:  You develop severe numbness or tingling in your foot.  Your foot turns blue, white, or gray, and it feels cold. This information is not intended to replace advice given to you by your health care provider. Make sure you discuss any questions you have with your health care provider. Document Released: 07/13/2001 Document Revised: 06/29/2015 Document Reviewed: 11/24/2013 Elsevier Interactive Patient Education  2018 [employer].

## 2017-02-27 NOTE — Progress Notes (Signed)
Subjective:    Patient ID: Wanda Oneill, female    DOB: 02-24-1980, 37 y.o.   MRN: [id number] Chief Complaint  Patient presents with  . Foot Injury    x1 week, pt states she missed on step going down and fell and twisted her right ankle and right knee.  . Knee Injury    HPI Went down into a sunken room and fell and went down onto right knee and both feet.  Still has aching in the knee which is wexing/waning and has some new lumps but may have been there prior has had bad varicose veins in Rt leg (surgically fixed in Lt). Main complaint is ongoing Rt foot pain with pain in center dorsum of foot - mild swelling and subtle bruising. Has been able to wear some thick soled shoes with a mary jane strap with comfort but others hurt. Limping a little. Iced a few times but heat feel better.    Past Medical History:  Diagnosis Date  . Diabetes mellitus    type II  . Down syndrome   . Family history of adverse reaction to anesthesia    mom has had n/v  . Gastritis   . GERD (gastroesophageal reflux disease)   . Headache   . Hepatic steatosis   . Hidradenitis   . Hypothyroidism   . Irritable bowel syndrome   . Periumbilical hernia   . Pneumonia   . Restless legs   . Tendonitis    chronic in left foot  . Thyroid disease    hypothyroidism   Past Surgical History:  Procedure Laterality Date  . AXILLARY HIDRADENITIS EXCISION Bilateral   . CHOLECYSTECTOMY    . HERNIA REPAIR     multiple  . INCISIONAL HERNIA REPAIR  05/30/2015   LAPROSCOPIC  . INCISIONAL HERNIA REPAIR N/A 05/30/2015   Procedure: LAPAROSCOPIC INCISIONAL HERNIA;  Surgeon: Rolm Bookbinder, MD;  Location: [place];  Service: General;  Laterality: N/A;  . INSERTION OF MESH N/A 05/30/2015   Procedure: INSERTION OF MESH;  Surgeon: Rolm Bookbinder, MD;  Location: [place];  Service: General;  Laterality: N/A;  . LAPAROSCOPY N/A 05/30/2015   Procedure: LAPAROSCOPY DIAGNOSTIC;  Surgeon: Rolm Bookbinder, MD;  Location: [place];   Service: General;  Laterality: N/A;  . TONSILECTOMY, ADENOIDECTOMY, BILATERAL MYRINGOTOMY AND TUBES     Current Outpatient Medications on File Prior to Visit  Medication Sig Dispense Refill  . acetaminophen (TYLENOL) 500 MG tablet Take 2 tablets (1,000 mg total) by mouth every 8 (eight) hours as needed for mild pain or moderate pain. Take 2 tabs every 8 hours. (Patient taking differently: Take 1,000 mg by mouth 2 (two) times daily as needed for mild pain or moderate pain. Take 2 tabs every 8 hours.) 30 tablet 0  . allopurinol (ZYLOPRIM) 300 MG tablet Take 300 mg by mouth daily.    . Blood Glucose Monitoring Suppl (BLOOD GLUCOSE METER KIT AND SUPPLIES) Dispense based on patient and insurance preference. To check blood sugars daily FOR ICD-9 250.00 1 each 0  . cetirizine (ZYRTEC) 10 MG tablet Take 10 mg by mouth daily as needed for allergies.     . clobetasol cream (TEMOVATE) 0.37 % Apply 1 application topically daily as needed (for eczema).     . dicyclomine (BENTYL) 20 MG tablet TAKE ONE TABLET BY MOUTH TWICE DAILY 60 tablet 2  . famotidine (PEPCID) 20 MG tablet Take 20 mg by mouth daily.     . INVOKAMET 150-500 MG TABS  Take 1 tablet by mouth daily. Once daily with dinner  1  . levothyroxine (SYNTHROID, LEVOTHROID) 137 MCG tablet Take 1 tablet (137 mcg total) by mouth daily before breakfast. PATIENT NEEDS OFFICE VISIT/LABS FOR ADDITIONAL REFILLS 30 tablet 0  . metFORMIN (GLUCOPHAGE) 1000 MG tablet 2 (two) times daily.  2  . naproxen sodium (ANAPROX) 220 MG tablet Take 220 mg by mouth daily as needed (for pain).     . norgestimate-ethinyl estradiol (ORTHO-CYCLEN,SPRINTEC,PREVIFEM) 0.25-35 MG-MCG tablet Take 1 tablet by mouth daily.    Marland Kitchen omeprazole (PRILOSEC) 20 MG capsule TAKE 1 CAPSULE (20 MG TOTAL) BY MOUTH DAILY. (Patient taking differently: TAKE 1 CAPSULE (20 MG TOTAL) BY MOUTH DAILY AS NEEDED for acid reflux) 90 capsule 0  . ONE TOUCH ULTRA TEST test strip USE TO CHECK BLOOD SUGAR ONCE DAILY  (ICD CODE:25.00) 100 each 3  . sitaGLIPtin (JANUVIA) 100 MG tablet Take 1 tablet (100 mg total) by mouth daily. 90 tablet 3  . baclofen (LIORESAL) 10 MG tablet Take 10 mg by mouth daily as needed for muscle spasms.     . benzonatate (TESSALON) 200 MG capsule Take 1 capsule (200 mg total) by mouth 3 (three) times daily as needed for cough. (Patient not taking: Reported on 02/27/2017) 30 capsule 1  . ipratropium (ATROVENT) 0.06 % nasal spray Place 2 sprays into both nostrils 4 (four) times daily. (Patient not taking: Reported on 02/27/2017) 15 mL 1  . predniSONE (DELTASONE) 50 MG tablet 1 tab daily for 2 days then 1/2 tab daily for 2 days. (Patient not taking: Reported on 02/27/2017) 3 tablet 0   No current facility-administered medications on file prior to visit.    Allergies  Allergen Reactions  . Cefuroxime Hives and Swelling    Unspecified location of swelling  . Cefuroxime Axetil Hives  . Sulfa Antibiotics Hives   Family History  Problem Relation Age of Onset  . Thyroid cancer Mother   . Diabetes type II Mother   . High Cholesterol Mother   . Heart disease Mother   . Diabetes type II Father   . Hypertension Father   . Stroke Father    Social History   Socioeconomic History  . Marital status: Single    Spouse name: None  . Number of children: None  . Years of education: None  . Highest education level: None  Social Needs  . Financial resource strain: None  . Food insecurity - worry: None  . Food insecurity - inability: None  . Transportation needs - medical: None  . Transportation needs - non-medical: None  Occupational History  . Occupation: disabled  Tobacco Use  . Smoking status: Never Smoker  . Smokeless tobacco: Never Used  Substance and Sexual Activity  . Alcohol use: No    Alcohol/week: 0.0 oz  . Drug use: No  . Sexual activity: Yes    Birth control/protection: Pill  Other Topics Concern  . None  Social History Narrative  . None   Depression screen [facility]  2/9 02/27/2017 02/06/2015  Decreased Interest 0 0  Down, Depressed, Hopeless 0 0  PHQ - 2 Score 0 0    Review of Systems  Constitutional: Positive for activity change.  Cardiovascular: Negative for leg swelling.  Musculoskeletal: Positive for arthralgias, gait problem (limping some) and joint swelling (foot). Negative for myalgias.  Skin: Negative for color change, pallor and wound.  Neurological: Negative for weakness and numbness.  Hematological: Bruises/bleeds easily.       Objective:  Physical Exam  Constitutional: She is oriented to person, place, and time. She appears well-developed and well-nourished. No distress.  HENT:  Head: Normocephalic and atraumatic.  Right Ear: External ear normal.  Eyes: Conjunctivae are normal. No scleral icterus.  Pulmonary/Chest: Effort normal.  Musculoskeletal:       Right knee: She exhibits normal range of motion, no swelling, no effusion, normal patellar mobility and no bony tenderness. No tenderness found.       Right ankle: She exhibits decreased range of motion (mild). She exhibits no swelling, no ecchymosis and normal pulse. No tenderness.       Right foot: There is decreased range of motion, tenderness, bony tenderness and swelling (subtle). There is normal capillary refill and no crepitus.  Varicose veins over patella but not tender, no color change, easily compressible.  Foot tender over proximal 2nd and 3rd metatarsals on the dorsal surface only  Neurological: She is alert and oriented to person, place, and time.  Skin: Skin is warm and dry. She is not diaphoretic. No erythema.  Psychiatric: She has a normal mood and affect. Her behavior is normal.         BP 120/78 (BP Location: Left Arm, Patient Position: Sitting, Cuff Size: Large)   Pulse 91   Temp 98.7 F (37.1 C) (Oral)   Resp 18   Ht _0  (1.626 m)   Wt 240 lb 9.6 oz (109.1 kg)   LMP 02/04/2017   SpO2 97%   BMI 41.30 kg/m   Dg Knee Complete 4 Views Right  Result  Date: 02/27/2017 CLINICAL DATA:  Right knee pain due to a fall today. Initial encounter. EXAM: RIGHT KNEE - COMPLETE 4+ VIEW COMPARISON:  None. FINDINGS: No evidence of fracture, dislocation, or joint effusion. No evidence of arthropathy or other focal bone abnormality. Soft tissues are unremarkable. IMPRESSION: Negative exam. Electronically Signed   By: Inge Rise M.D.   On: 02/27/2017 15:39   Dg Foot Complete Right  Result Date: 02/27/2017 CLINICAL DATA:  Tenderness and swelling. EXAM: RIGHT FOOT COMPLETE - 3+ VIEW COMPARISON:  No recent. FINDINGS: Diffuse degenerative change. Degenerative changes most prominent first metatarsophalangeal joint. No evidence of fracture dislocation. No acute bony abnormality. Diffuse soft tissue swelling. No radiopaque foreign body. IMPRESSION: 1.  Diffuse soft tissue swelling.  No radiopaque foreign body. 2. Diffuse degenerative change. Degenerative changes most prominent about the first metatarsophalangeal joint. No acute bony abnormality. Electronically Signed   By: Marcello Moores  Register   On: 02/27/2017 15:39    Assessment & Plan:   1. Injury of right foot, initial encounter   2. Injury of right knee, initial encounter - suspect bruised - does have some varicose veins over patella but may have been there prior  3. Other fall from one level to another as cause of accidental injury at home as place of occurrence, initial encounter   4.      Right foot sprain - continue conservative care with RICE. Has firm-soled shoes she can wear at all times during day - ok to remove while asleep - so no need for post-op shoe. RTC if worsening or not healed in 1 mo.  Orders Placed This Encounter  Procedures  . DG Foot Complete Right    Standing Status:   Future    Number of Occurrences:   1    Standing Expiration Date:   02/27/2018    Order Specific Question:   Reason for Exam (SYMPTOM  OR DIAGNOSIS REQUIRED)  Answer:   tenderness and swelling over 1-3rd proximal  metatarsals since fell down step 8d ago    Order Specific Question:   Is the patient pregnant?    Answer:   No    Order Specific Question:   Preferred imaging location?    Answer:   External  . DG Knee Complete 4 Views Right    Standing Status:   Future    Number of Occurrences:   1    Standing Expiration Date:   02/27/2018    Order Specific Question:   Reason for Exam (SYMPTOM  OR DIAGNOSIS REQUIRED)    Answer:   fell onto knee 8d prior with incontinued intermittent pain    Order Specific Question:   Is the patient pregnant?    Answer:   No    Order Specific Question:   Preferred imaging location?    Answer:   External    Delman Cheadle, M.D.  Primary Care at [facility] [address] [place], [place] [address] [phone] phone [phone] fax  03/02/17 1:37 PM

## 2017-03-04 NOTE — Progress Notes (Signed)
Wanda Oneill [address] [place], [place] [address] [phone]  Date:  03/05/2017   Name:  Wanda Oneill   DOB:  [birth year]   MRN:  [id number]  PCP:  Darreld Mclean, MD    Chief Complaint: Establish Care   History of Present Illness:  Wanda Oneill is a 37 y.o. very pleasant female patient who presents with the following:  Sheliah is a here as a new pt to [facility], although I have known her for years and used to see her at [facility].  Her dad Dr. Ruben Reason is my former partner. Kealie has down syndrome, DM, hypothyroidism, obesity  She is a pt of Dr. Chalmers Cater for endocrinology - for her DM, but not for her thyroid  She sees Dr. Hilarie Fredrickson for IBS  She recently had her left varicose veins stripped- this turned out great.  She needs to get her right leg done as well  She has had a lot of ventral hernia repairs done- they think 6 times.    A1c in care everywhere from 2/18- 7.7, TSH 2.9  Foot exam: do today Eye exam: [facility] Pneumovax: she did have "a long time ago" but it seemed to make her itch Flu: october Urine micro: We are not sure of her tdap history- will update for her today as she has many young nieces and nephews   She has her own apartment.  She had been working at [employer] for a while, but kept getting injured so she gave this up.  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 Could be psoriasis.  Never had a biopsy  Patient Active Problem List   Diagnosis Date Noted  . Incarcerated epigastric hernia 05/30/2015  . Left foot pain 08/09/2014  . Chronic diarrhea 07/27/2014  . Gout 08/10/2012  . Acid reflux 01/22/2012  .  Down syndrome 11/11/2011  . Hernia of abdominal wall 10/21/2011  . Psoriasis 10/21/2011  . Type 2 diabetes mellitus, uncontrolled ([place]) 04/12/2011  . Hypothyroid 04/12/2011  . Gait abnormality 06/22/2010  . Tibial tendinitis, posterior 06/22/2010    Past Medical History:  Diagnosis Date  . Diabetes mellitus    type II  . Down syndrome   . Family history of adverse reaction to anesthesia    mom has had n/v  . Gastritis   . GERD (gastroesophageal reflux disease)   . Headache   . Hepatic steatosis   . Hidradenitis   . Hypothyroidism   . Irritable bowel syndrome   . Periumbilical hernia   . Pneumonia   . Restless legs   . Tendonitis    chronic in left foot  . Thyroid disease    hypothyroidism    Past Surgical History:  Procedure Laterality Date  . AXILLARY HIDRADENITIS EXCISION Bilateral   . CHOLECYSTECTOMY    . HERNIA REPAIR     multiple  . INCISIONAL HERNIA REPAIR  05/30/2015   LAPROSCOPIC  . INCISIONAL HERNIA REPAIR N/A 05/30/2015   Procedure: LAPAROSCOPIC INCISIONAL HERNIA;  Surgeon: Rolm Bookbinder, MD;  Location: [place];  Service: General;  Laterality: N/A;  .  INSERTION OF MESH N/A 05/30/2015   Procedure: INSERTION OF MESH;  Surgeon: Rolm Bookbinder, MD;  Location: [facility];  Service: General;  Laterality: N/A;  . LAPAROSCOPY N/A 05/30/2015   Procedure: LAPAROSCOPY DIAGNOSTIC;  Surgeon: Rolm Bookbinder, MD;  Location: [facility];  Service: General;  Laterality: N/A;  . TONSILECTOMY, ADENOIDECTOMY, BILATERAL MYRINGOTOMY AND TUBES      Social History   Tobacco Use  . Smoking status: Never Smoker  . Smokeless tobacco: Never Used  Substance Use Topics  . Alcohol use: No    Alcohol/week: 0.0 oz  . Drug use: No    Family History  Problem Relation Age of Onset  . Thyroid cancer Mother   . Diabetes type II Mother   . High Cholesterol Mother   . Heart disease Mother   . Diabetes type II Father   . Hypertension Father   . Stroke Father     Allergies  Allergen  Reactions  . Cefuroxime Hives and Swelling    Unspecified location of swelling  . Cefuroxime Axetil Hives  . Sulfa Antibiotics Hives    Medication list has been reviewed and updated.  Current Outpatient Medications on File Prior to Visit  Medication Sig Dispense Refill  . acetaminophen (TYLENOL) 500 MG tablet Take 2 tablets (1,000 mg total) by mouth every 8 (eight) hours as needed for mild pain or moderate pain. Take 2 tabs every 8 hours. (Patient taking differently: Take 1,000 mg by mouth 2 (two) times daily as needed for mild pain or moderate pain. Take 2 tabs every 8 hours.) 30 tablet 0  . allopurinol (ZYLOPRIM) 300 MG tablet Take 300 mg by mouth daily.    . baclofen (LIORESAL) 10 MG tablet Take 10 mg by mouth daily as needed for muscle spasms.     . benzonatate (TESSALON) 200 MG capsule Take 1 capsule (200 mg total) by mouth 3 (three) times daily as needed for cough. 30 capsule 1  . Blood Glucose Monitoring Suppl (BLOOD GLUCOSE METER KIT AND SUPPLIES) Dispense based on patient and insurance preference. To check blood sugars daily FOR ICD-9 250.00 1 each 0  . cetirizine (ZYRTEC) 10 MG tablet Take 10 mg by mouth daily as needed for allergies.     . clobetasol cream (TEMOVATE) 3.90 % Apply 1 application topically daily as needed (for eczema).     . dicyclomine (BENTYL) 20 MG tablet TAKE ONE TABLET BY MOUTH TWICE DAILY 60 tablet 2  . famotidine (PEPCID) 20 MG tablet Take 20 mg by mouth daily.     . INVOKAMET 150-500 MG TABS Take 1 tablet by mouth daily. Once daily with dinner  1  . ipratropium (ATROVENT) 0.06 % nasal spray Place 2 sprays into both nostrils 4 (four) times daily. 15 mL 1  . levothyroxine (SYNTHROID, LEVOTHROID) 137 MCG tablet Take 1 tablet (137 mcg total) by mouth daily before breakfast. PATIENT NEEDS OFFICE VISIT/LABS FOR ADDITIONAL REFILLS 30 tablet 0  . metFORMIN (GLUCOPHAGE) 1000 MG tablet 2 (two) times daily.  2  . naproxen sodium (ANAPROX) 220 MG tablet Take 220 mg by  mouth daily as needed (for pain).     . norgestimate-ethinyl estradiol (ORTHO-CYCLEN,SPRINTEC,PREVIFEM) 0.25-35 MG-MCG tablet Take 1 tablet by mouth daily.    Marland Kitchen omeprazole (PRILOSEC) 20 MG capsule TAKE 1 CAPSULE (20 MG TOTAL) BY MOUTH DAILY. (Patient taking differently: TAKE 1 CAPSULE (20 MG TOTAL) BY MOUTH DAILY AS NEEDED for acid reflux) 90 capsule 0  . ONE TOUCH ULTRA TEST test strip USE  TO CHECK BLOOD SUGAR ONCE DAILY (ICD CODE:25.00) 100 each 3  . predniSONE (DELTASONE) 50 MG tablet 1 tab daily for 2 days then 1/2 tab daily for 2 days. 3 tablet 0  . sitaGLIPtin (JANUVIA) 100 MG tablet Take 1 tablet (100 mg total) by mouth daily. 90 tablet 3   No current facility-administered medications on file prior to visit.     Review of Systems:  As per HPI- otherwise negative.   Physical Examination: Vitals:   03/05/17 1537  BP: 126/88  Pulse: 80  Resp: 18  Temp: 97.8 F (36.6 C)  SpO2: 99%   Vitals:   03/05/17 1537  Weight: 240 lb 12.8 oz (109.2 kg)  Height: '5\' 4"'  (1.626 m)   Body mass index is 41.33 kg/m. Ideal Body Weight: Weight in (lb) to have BMI = 25: 145.3  GEN: WDWN, NAD, Non-toxic, A & O x 3, obese, down syndrome.  Looks well  HEENT: Atraumatic, Normocephalic. Neck supple. No masses, No LAD.  Bilateral TM wnl, oropharynx normal.  PEERL,EOMI.   Ears and Nose: No external deformity. CV: RRR, No M/G/R. No JVD. No thrill. No extra heart sounds. PULM: CTA B, no wheezes, crackles, rhonchi. No retractions. No resp. distress. No accessory muscle use. EXTR: No c/c/e NEURO Normal gait.  PSYCH: Normally interactive. Conversant. Not depressed or anxious appearing.  Calm demeanor.  She has very dry and scaly skin over the knuckles of both hands and in a plaque pattern on her right elbow.  May be psoriasis   Assessment and Plan: Acquired hypothyroidism  Controlled type 2 diabetes mellitus without complication, without long-term current use of insulin ([place])  Immunization due -  Plan: Tdap vaccine greater than or equal to 7yo IM  Dry skin  Updated tdap today Offered to do a skin bx for her today but she would prefer to delay as she is already getting a tetanus shot today She will come in to see me soon to have this bx done  Signed Lamar Blinks, MD  Touched base with one of the ID docs about the possibility of further pneumonia vaccine doses.  She did suggest a dose of prevnar followed in 5 years with a booster dose.  Will offer this to [employer] message to pt:  I asked one of the infectious disease doctors about if you should have a different type of pneumonia vaccine - since you got sick with pneumonia last year.  She did suggest that you have the prevar 13 vaccine shot, with a booster dose in 5 years.   I wanted to offer this to you at your convenience.  Also, she suggest that we check an IgG panel to look for any immune deficiencies which we can do when we next draw blood

## 2017-03-05 ENCOUNTER — Ambulatory Visit: Payer: Medicare Other | Admitting: Family Medicine

## 2017-03-05 ENCOUNTER — Encounter: Payer: Self-pay | Admitting: Family Medicine

## 2017-03-05 VITALS — BP 126/88 | HR 80 | Temp 97.8°F | Resp 18 | Ht 64.0 in | Wt 240.8 lb

## 2017-03-05 DIAGNOSIS — L853 Xerosis cutis: Secondary | ICD-10-CM | POA: Diagnosis not present

## 2017-03-05 DIAGNOSIS — Z23 Encounter for immunization: Secondary | ICD-10-CM

## 2017-03-05 DIAGNOSIS — E039 Hypothyroidism, unspecified: Secondary | ICD-10-CM | POA: Diagnosis not present

## 2017-03-05 DIAGNOSIS — E119 Type 2 diabetes mellitus without complications: Secondary | ICD-10-CM

## 2017-03-05 NOTE — Patient Instructions (Addendum)
It was so nice to see you again today! I would like to see you and take a very small biopsy of your skin to see if you have psoriasis.  We can do this at your convenience- please schedule a 30 minute appt for this  Let me look into the question of if you need any additional pneumonia vaccine coverage- I will be in touch with you once I do some research   You got your tdap vaccine today- this will make sure you are protected against the whooping cough, which is important given the time you spend around kids!

## 2017-03-07 ENCOUNTER — Ambulatory Visit (INDEPENDENT_AMBULATORY_CARE_PROVIDER_SITE_OTHER): Payer: Medicare Other | Admitting: Internal Medicine

## 2017-03-07 ENCOUNTER — Encounter: Payer: Self-pay | Admitting: Internal Medicine

## 2017-03-07 VITALS — BP 118/68 | HR 74 | Ht 64.0 in | Wt 239.1 lb

## 2017-03-07 DIAGNOSIS — R109 Unspecified abdominal pain: Secondary | ICD-10-CM | POA: Diagnosis not present

## 2017-03-07 DIAGNOSIS — K529 Noninfective gastroenteritis and colitis, unspecified: Secondary | ICD-10-CM

## 2017-03-07 DIAGNOSIS — K219 Gastro-esophageal reflux disease without esophagitis: Secondary | ICD-10-CM | POA: Diagnosis not present

## 2017-03-07 DIAGNOSIS — K580 Irritable bowel syndrome with diarrhea: Secondary | ICD-10-CM

## 2017-03-07 MED ORDER — DIPHENOXYLATE-ATROPINE 2.5-0.025 MG PO TABS: 1.0000 | ORAL_TABLET | Freq: Four times a day (QID) | ORAL | 2 refills | Status: DC | PRN

## 2017-03-07 MED ORDER — DICYCLOMINE HCL 20 MG PO TABS: 20.0000 mg | ORAL_TABLET | Freq: Two times a day (BID) | ORAL | 2 refills | Status: DC

## 2017-03-07 NOTE — Progress Notes (Signed)
Subjective:    Patient ID: Wanda Oneill, female    DOB: Aug 15, 1980, 38 y.o.   MRN: [id number]  HPI Wanda Oneill is a 37 year old female with a history of GERD, dyspepsia, chronic diarrhea with abdominal cramping pain, Down syndrome, hypothyroidism, type 2 diabetes, and ventral hernia status post multiple repairs, prior cholecystectomy who is here for follow-up.  She is here today with her mother.  She was last seen on 01/26/2016.  She had been treated several times for her chronic diarrhea with abdominal cramping with rifaximin.  Initially her response to this medication was superb and she had 5-6 months of benefit.  We last treated her in October 2018 with rifaximin 550 mg 3 times daily times 14 days and her relief only lasted 1-2 weeks.  She is currently struggling with frequent loose and urgent stools with mid and lower abdominal crampy pain.  Stools are nonbloody and non-melenic.  This is despite Bentyl 20 mg 1-3 times a day.  She is having some acid reflux and indigestion for which she has been primarily using Pepcid 1-2 times daily.  She is not using omeprazole on a regular basis.  She is also using the over-the-counter product IBgard with some benefit.  She estimates she can have 7 or 8 stools just in the morning and at times stools can wake her from sleep which is interrupting to her sleep habits.  Appetite has remained good.  She has not lost weight.  The Bentyl does seem to help her abdominal cramping and her mother feels that she uses this at least twice daily.  I requested previous stool studies but they have had a hard time completing these.  She had an upper endoscopy previously in March 2015 which revealed minimal chronic gastritis without H. pylori.  Small bowel biopsies were negative for celiac disease.  Review of Systems As per HPI, otherwise negative  Current Medications, Allergies, Past Medical History, Past Surgical History, Family History and Social History were reviewed in  [employer] record.     Objective:   Physical Exam BP 118/68   Pulse 74   Ht 5\' 4"  (1.626 m)   Wt 239 lb 2 oz (108.5 kg)   BMI 41.05 kg/m  Constitutional: Well-developed and well-nourished. No distress. HEENT: Normocephalic and atraumatic. Oropharynx is clear and moist. Conjunctivae are normal.  No scleral icterus. Neck: Neck supple. Trachea midline. Cardiovascular: Normal rate, regular rhythm and intact distal pulses. No M/R/G Pulmonary/chest: Effort normal and breath sounds normal. No wheezing, rales or rhonchi. Abdominal: Soft, obese, nontender, nondistended. Bowel sounds active throughout.  Well-healed prior abdominal scars Extremities: no clubbing, cyanosis, or edema Neurological: Alert and oriented to person place and time. Skin: Skin is warm and dry. Psychiatric: Normal mood and affect. Behavior is normal.     Assessment & Plan:  37 year old female with a history of GERD, dyspepsia, chronic diarrhea with abdominal cramping pain, Down syndrome, hypothyroidism, type 2 diabetes, and ventral hernia status post multiple repairs, prior cholecystectomy who is here for follow-up.  1.  IBS with diarrhea and mid and lower abdominal cramping --her symptoms responded initially to rifaximin making IBS or SIBO the most likely etiology.  That said she is struggling now with frequent loose stools which are affecting her life and sleep.  We discussed possible colonoscopy which she would like to avoid if possible.  I have recommended that we perform the [facility] test called IBCause --this hopefully help identify the exact cause of her chronic  diarrhea (it would retest for celiac disease which I think should be done given the high association of celiac and Down syndrome, check for bile salt diarrhea given her cholecystectomy, check for pancreatic insufficiency, check for inflammatory diarrhea).  Her mother will check with her insurance company, I believe Medicaid should cover  this, and if so we will proceed with this test --In the interim, continue Bentyl 20 mg 3 times daily as needed.  Continue IBgard per box instruction. --New prescription for Lomotil 1 tablet 4 times daily as needed (she was told to avoid this when we are performing the stool tests discussed above)  2.  GERD and dyspepsia --as omeprazole can worsen diarrhea, I recommended Pepcid 20 mg twice daily, about 12 hours apart if possible  25 minutes spent with the patient today. Greater than 50% was spent in counseling and coordination of care with the patient

## 2017-03-07 NOTE — Patient Instructions (Addendum)
Please take pepcid twice daily.  Discontinue omeprazole.  Continue Bentyl 20 mg three times daily as needed.  Continue IG Guard as needed.  We have sent the following medications to your pharmacy for you to pick up at your convenience: Lomotil four times daily as needed  Your physician has requested that you have specialized "[facility]" lab work completed. Please take the order form given to you at your visit today to [facility] station closest to you. We will contact you once results have been made available to [place]. If you have not heard from our office within 2 weeks after having lab work completed, please contact us at [phone].

## 2017-03-08 ENCOUNTER — Encounter: Payer: Self-pay | Admitting: Family Medicine

## 2017-03-23 ENCOUNTER — Other Ambulatory Visit: Payer: Self-pay | Admitting: Family Medicine

## 2017-03-31 NOTE — Progress Notes (Signed)
[facility] [place] [address], [address] [place], [place] [address] [phone] Fax [phone]  Date:  04/07/2017   Name:  Wanda Oneill   DOB:  03/02/1980   MRN:  [id number]  PCP:  ,  C, MD    Chief Complaint: Biopsy   History of Present Illness:  Wanda Oneill is a 36 y.o. very pleasant female patient who presents with the following:  Following up today with concern about her skin.  She has always suffered from very try skin, thought to be eczema She does have dx of arthritis- we wonder if she might have psoriasis and thus psoriatic arthritis.  However she has never had a formal dx of her skin issue.  She is having a punch bx done today for this reason.    Wanda Oneill is here today with her mom.  She understands the plan for a punch bx and is ok with this being done today  She also needs me to put refills of her meds on file for her- a lot of her meds are coming from different providers and she would like to get them all in one place  Dr. Balan is managing her DM   Patient Active Problem List   Diagnosis Date Noted  . Incarcerated epigastric hernia 05/30/2015  . Left foot pain 08/09/2014  . Chronic diarrhea 07/27/2014  . Gout 08/10/2012  . Acid reflux 01/22/2012  . Down syndrome 11/11/2011  . Hernia of abdominal wall 10/21/2011  . Psoriasis 10/21/2011  . Type 2 diabetes mellitus, uncontrolled ([facility]) 04/12/2011  . Hypothyroid 04/12/2011  . Gait abnormality 06/22/2010  . Tibial tendinitis, posterior 06/22/2010    Past Medical History:  Diagnosis Date  . Diabetes mellitus    type II  . Down syndrome   . Family history of adverse reaction to anesthesia    mom has had n/v  . Gastritis   . GERD (gastroesophageal reflux disease)   . Headache   . Hepatic steatosis   . Hidradenitis   . Hypothyroidism   . Irritable bowel syndrome   . Periumbilical hernia   . Pneumonia   . Restless legs   . Tendonitis    chronic in left  foot  . Thyroid disease    hypothyroidism    Past Surgical History:  Procedure Laterality Date  . AXILLARY HIDRADENITIS EXCISION Bilateral   . CHOLECYSTECTOMY    . HERNIA REPAIR     multiple  . INCISIONAL HERNIA REPAIR  05/30/2015   LAPROSCOPIC  . INCISIONAL HERNIA REPAIR N/A 05/30/2015   Procedure: LAPAROSCOPIC INCISIONAL HERNIA;  Surgeon: Matthew Wakefield, MD;  [facility];  Service: General;  Laterality: N/A;  . INSERTION OF MESH N/A 05/30/2015   Procedure: INSERTION OF MESH;  Surgeon: Matthew Wakefield, MD;  [facility];  Service: General;  Laterality: N/A;  . LAPAROSCOPY N/A 05/30/2015   Procedure: LAPAROSCOPY DIAGNOSTIC;  Surgeon: Matthew Wakefield, MD;  Location[facility];  Service: General;  Laterality: N/A;  . TONSILECTOMY, ADENOIDECTOMY, BILATERAL MYRINGOTOMY AND TUBES      Social History   Tobacco Use  . Smoking status: Never Smoker  . Smokeless tobacco: Never Used  Substance Use Topics  . Alcohol use: No    Alcohol/week: 0.0 oz  . Drug use: No    Family History  Problem Relation Age of Onset  . Thyroid cancer Mother   . Diabetes type II Mother   . High Cholesterol Mother   .   Heart disease Mother   . Diabetes type II Father   . Hypertension Father   . Stroke Father     Allergies  Allergen Reactions  . Cefuroxime Hives and Swelling    Unspecified location of swelling  . Cefuroxime Axetil Hives  . Sulfa Antibiotics Hives    Medication list has been reviewed and updated.  Current Outpatient Medications on File Prior to Visit  Medication Sig Dispense Refill  . acetaminophen (TYLENOL) 500 MG tablet Take 2 tablets (1,000 mg total) by mouth every 8 (eight) hours as needed for mild pain or moderate pain. Take 2 tabs every 8 hours. (Patient taking differently: Take 1,000 mg by mouth 2 (two) times daily as needed for mild pain or moderate pain. Take 2 tabs every 8 hours.) 30 tablet 0  . allopurinol (ZYLOPRIM) 300 MG tablet Take 1 tablet (300 mg total) by  mouth daily. 30 tablet 2  . baclofen (LIORESAL) 10 MG tablet Take 10 mg by mouth daily as needed for muscle spasms.     . Blood Glucose Monitoring Suppl (BLOOD GLUCOSE METER KIT AND SUPPLIES) Dispense based on patient and insurance preference. To check blood sugars daily FOR ICD-9 250.00 1 each 0  . cetirizine (ZYRTEC) 10 MG tablet Take 10 mg by mouth daily as needed for allergies.     . clobetasol cream (TEMOVATE) 0.05 % Apply 1 application topically daily as needed (for eczema).     . dicyclomine (BENTYL) 20 MG tablet Take 1 tablet (20 mg total) by mouth 2 (two) times daily. 60 tablet 2  . diphenoxylate-atropine (LOMOTIL) 2.5-0.025 MG tablet Take 1 tablet by mouth 4 (four) times daily as needed for diarrhea or loose stools. 120 tablet 2  . famotidine (PEPCID) 20 MG tablet Take 20 mg by mouth daily.     . levothyroxine (SYNTHROID, LEVOTHROID) 137 MCG tablet Take 1 tablet (137 mcg total) by mouth daily before breakfast. PATIENT NEEDS OFFICE VISIT/LABS FOR ADDITIONAL REFILLS 30 tablet 0  . liraglutide (VICTOZA) 18 MG/3ML SOPN Inject into the skin daily.    . metFORMIN (GLUCOPHAGE) 1000 MG tablet 2 (two) times daily.  2  . naproxen sodium (ANAPROX) 220 MG tablet Take 220 mg by mouth daily as needed (for pain).     . norgestimate-ethinyl estradiol (ORTHO-CYCLEN,SPRINTEC,PREVIFEM) 0.25-35 MG-MCG tablet Take 1 tablet by mouth daily.    . ONE TOUCH ULTRA TEST test strip USE TO CHECK BLOOD SUGAR ONCE DAILY (ICD CODE:25.00) 100 each 3  . sitaGLIPtin (JANUVIA) 100 MG tablet Take 1 tablet (100 mg total) by mouth daily. 90 tablet 3   No current facility-administered medications on file prior to visit.     Review of Systems:  As per HPI- otherwise negative.   Physical Examination: Vitals:   04/07/17 1359  BP: 118/72  Pulse: 86  Resp: 16  Temp: 98.2 F (36.8 C)  SpO2: 98%   Vitals:   04/07/17 1359  Weight: 238 lb (108 kg)  Height: 5' 4" (1.626 m)   Body mass index is 40.85 kg/m. Ideal  Body Weight: Weight in (lb) to have BMI = 25: 145.3  GEN: WDWN, NAD, Non-toxic, A & O x 3, obese, looks well  HEENT: Atraumatic, Normocephalic. Neck supple. No masses, No LAD. Ears and Nose: No external deformity. CV: RRR, No M/G/R. No JVD. No thrill. No extra heart sounds. PULM: CTA B, no wheezes, crackles, rhonchi. No retractions. No resp. distress. No accessory muscle use. EXTR: No c/c/e NEURO Normal gait.  PSYCH: Normally   interactive. Conversant. Not depressed or anxious appearing.  Calm demeanor.  She has scaly, white in color plaques on her knees and on her elbows,  And one plaque distal to the elbow on the dorsal left forearm  VC obtained.  Prepped skin with betadine, draped and used aseptic technique.  Anesthesia with small wheal of 1% lido with epi.  Took 39m punch bx through the dermis and sent to path.  Silver nitrate for hemostasis and bandage applied.  Pt tolerated very well, minimal EBL.    Assessment and Plan: Psoriasis - Plan: Dermatology pathology  Punch bx of skin plaque left arm.  Await pathology report and will be in touch  Had agreed to "refill all her medications" but realized I will need some clarification, will message her mom with a few questions as below  Signed JLamar Blinks MD  Allopurinol- is this a long term drug?  Baclofen- is she still taking this? Bentyl- from Dr. PHilarie Fredrickson he has rx for twice a day.  Will refill for her Lomotil- generally a prn med.  Will not refill for now pepcid- OTC?  Levothyroxine- we have not filled in some time.  What dose is LMersadieson now Victoza- per Dr. BChalmers CaterMetformin- I presume also from Dr. BChalmers Cater I am glad to refill if needed Birth control pills- refill needed? Januvia- still taking?

## 2017-04-02 ENCOUNTER — Ambulatory Visit: Payer: Medicare Other | Admitting: Family Medicine

## 2017-04-03 ENCOUNTER — Other Ambulatory Visit: Payer: Self-pay | Admitting: General Surgery

## 2017-04-03 DIAGNOSIS — K432 Incisional hernia without obstruction or gangrene: Secondary | ICD-10-CM

## 2017-04-04 ENCOUNTER — Telehealth: Payer: Self-pay | Admitting: *Deleted

## 2017-04-04 MED ORDER — ALLOPURINOL 300 MG PO TABS: 300.0000 mg | ORAL_TABLET | Freq: Every day | ORAL | 2 refills | Status: DC

## 2017-04-04 NOTE — Telephone Encounter (Signed)
Melissa-- please advise in PCP's absence.  Hinton Lovely  Sent: Thu April 03, 2017 7:09 PM  To: [facility] Clinical Pool      Message   ----- Message from [place], Generic sent at 04/03/2017 7:09 PM EST -----    Can U pls call in allopurinol for [facility].

## 2017-04-07 ENCOUNTER — Encounter: Payer: Self-pay | Admitting: Family Medicine

## 2017-04-07 ENCOUNTER — Ambulatory Visit (INDEPENDENT_AMBULATORY_CARE_PROVIDER_SITE_OTHER): Payer: Medicare Other | Admitting: Family Medicine

## 2017-04-07 VITALS — BP 118/72 | HR 86 | Temp 98.2°F | Resp 16 | Ht 64.0 in | Wt 238.0 lb

## 2017-04-07 DIAGNOSIS — L409 Psoriasis, unspecified: Secondary | ICD-10-CM

## 2017-04-07 MED ORDER — DICYCLOMINE HCL 20 MG PO TABS: 20.0000 mg | ORAL_TABLET | Freq: Two times a day (BID) | ORAL | 3 refills | Status: DC

## 2017-04-07 NOTE — Patient Instructions (Signed)
I will be in touch with your biopsy results asap Leave the wound clean and dry for today, tomorrow you can shower and then re-place bandage Keep a band-aid on for a few days until a good scab forms  Let me know if any sign of infection such as redness, swelling or pain

## 2017-04-13 ENCOUNTER — Encounter: Payer: Self-pay | Admitting: Family Medicine

## 2017-04-16 ENCOUNTER — Telehealth: Payer: Self-pay | Admitting: *Deleted

## 2017-04-16 NOTE — Telephone Encounter (Signed)
Received Dermatopathology Report results from [facility]; forwarded to provider/SLS 03/13    

## 2017-04-21 ENCOUNTER — Other Ambulatory Visit: Payer: Self-pay | Admitting: Family Medicine

## 2017-04-25 ENCOUNTER — Ambulatory Visit
Admission: RE | Admit: 2017-04-25 | Discharge: 2017-04-25 | Disposition: A | Discharge status: Home / Self Care | Payer: Medicare Other | Source: Ambulatory Visit | Attending: General Surgery | Admitting: General Surgery

## 2017-04-25 ENCOUNTER — Other Ambulatory Visit: Payer: Self-pay | Admitting: Family Medicine

## 2017-04-25 ENCOUNTER — Telehealth: Payer: Self-pay

## 2017-04-25 DIAGNOSIS — K432 Incisional hernia without obstruction or gangrene: Secondary | ICD-10-CM

## 2017-04-25 MED ORDER — LEVOTHYROXINE SODIUM 137 MCG PO TABS: 137.0000 ug | ORAL_TABLET | Freq: Every day | ORAL | 2 refills | Status: DC

## 2017-04-25 MED ORDER — DICYCLOMINE HCL 20 MG PO TABS: 20.0000 mg | ORAL_TABLET | Freq: Three times a day (TID) | ORAL | 3 refills | Status: DC

## 2017-04-25 MED ORDER — ALLOPURINOL 300 MG PO TABS: 300.0000 mg | ORAL_TABLET | Freq: Every day | ORAL | 3 refills | Status: DC

## 2017-04-25 MED ORDER — DIPHENOXYLATE-ATROPINE 2.5-0.025 MG PO TABS: 1.0000 | ORAL_TABLET | Freq: Four times a day (QID) | ORAL | 3 refills | Status: DC | PRN

## 2017-04-25 MED ORDER — BACLOFEN 10 MG PO TABS: 10.0000 mg | ORAL_TABLET | Freq: Every day | ORAL | 3 refills | Status: DC | PRN

## 2017-04-25 MED ORDER — IOPAMIDOL (ISOVUE-300) INJECTION 61%
100.0000 mL | Freq: Once | INTRAVENOUS | Status: AC | PRN
  Administered 2017-04-25: 100 mL via INTRAVENOUS

## 2017-04-25 MED ORDER — NORGESTIMATE-ETH ESTRADIOL 0.25-35 MG-MCG PO TABS: 1.0000 | ORAL_TABLET | Freq: Every day | ORAL | 4 refills | Status: DC

## 2017-04-25 NOTE — Telephone Encounter (Signed)
PA approved. Effective from 04/25/2017 through 04/26/2018.

## 2017-04-25 NOTE — Telephone Encounter (Signed)
PA initiated via Covermymeds; KEY: [id number]. Awaiting determination.

## 2017-04-25 NOTE — Progress Notes (Signed)
Orders only for medication refill

## 2017-04-29 ENCOUNTER — Other Ambulatory Visit: Payer: Self-pay | Admitting: Family Medicine

## 2017-04-30 ENCOUNTER — Encounter: Payer: Self-pay | Admitting: Internal Medicine

## 2017-05-15 ENCOUNTER — Telehealth: Payer: Self-pay | Admitting: *Deleted

## 2017-05-15 NOTE — Telephone Encounter (Signed)
Patient's IBCause Results from [facility] have returned. Per Dr Hilarie Fredrickson, "Please let patient know, IBCause reveals bile acid diarrhea as cause for her chronic loose stools. This can be treated; please begin cholestyramine 4 grams every morning. May need BID.Titrate to response. Separate med from others. JMP"  I have left a voicemail for patient to call back.  See original results under "Media" tab in EPIC.

## 2017-05-16 ENCOUNTER — Encounter: Payer: Self-pay | Admitting: Internal Medicine

## 2017-05-19 MED ORDER — CHOLESTYRAMINE 4 G PO PACK: 4.0000 g | PACK | ORAL | 2 refills | Status: DC

## 2017-05-19 NOTE — Telephone Encounter (Signed)
I have spoken to patient's mother and father to advise of IBCause test results and Dr Vena Rua recommendations. Cholestryamine will be sent to [facility] and they have been advised to titrate to twice daily dosing of medication if needed. They will call us back if this is needed so we can adjust medication amounts at the pharmacy.

## 2017-05-26 ENCOUNTER — Other Ambulatory Visit: Payer: Self-pay | Admitting: Family Medicine

## 2017-05-26 MED ORDER — LEVONORGEST-ETH ESTRAD 91-DAY 0.15-0.03 MG PO TABS
1.0000 | ORAL_TABLET | Freq: Every day | ORAL | 4 refills | Status: DC
Start: 2017-05-26 — End: 2018-08-17

## 2017-06-02 ENCOUNTER — Ambulatory Visit: Payer: Medicare Other | Admitting: Family Medicine

## 2017-06-17 ENCOUNTER — Telehealth: Payer: Self-pay | Admitting: *Deleted

## 2017-06-17 NOTE — Telephone Encounter (Signed)
Received Application and Renewal of Disability Parking Placard form [place] [employer]; forward to provider/SLS 05/14

## 2017-08-01 LAB — HM DIABETES EYE EXAM

## 2017-10-13 NOTE — H&P (Signed)
UNICOMPARTMENTAL KNEE ADMISSION H&P  Patient is being admitted for right lateral unicompartmental knee arthroplasty.  Subjective:  Chief Complaint:  Right knee lateral compartmental primary OA /pain    HPI: Wanda Oneill, 37 y.o. female , has a history of pain and functional disability in the right and has failed non-surgical conservative treatments for greater than 12 weeks to include NSAID's and/or analgesics, corticosteriod injections, viscosupplementation injections and activity modification.  Onset of symptoms was gradual, starting 7 years ago with gradually worsening course since that time. The patient noted prior procedures on the knee to include  arthroscopy on the right knee(s).  Patient currently rates pain in the right knee(s) at 7 out of 10 with activity. Patient has worsening of pain with activity and weight bearing, pain that interferes with activities of daily living, pain with passive range of motion, crepitus and joint swelling.  Patient has evidence of periarticular osteophytes and joint space narrowing of the lateral compartment by imaging studies.  There is no active infection.  Risks, benefits and expectations were discussed with the patient.  Risks including but not limited to the risk of anesthesia, blood clots, nerve damage, blood vessel damage, failure of the prosthesis, infection and up to and including death.  Patient understand the risks, benefits and expectations and wishes to proceed with surgery.   PCP: Copland, Gay Filler, MD  D/C Plans:       Home / SNF  Post-op Meds:       No Rx given   Tranexamic Acid:      To be given - IV   Decadron:      Is to be given  FYI:      ASA  Norco  DME:    Rx given for - RW & 3-n-1  PT:   OPPT Rx given   Past Medical History:  Diagnosis Date  . Diabetes mellitus    type II  . Down syndrome   . Family history of adverse reaction to anesthesia    mom has had n/v  . Gastritis   . GERD (gastroesophageal  reflux disease)   . Headache   . Hepatic steatosis   . Hidradenitis   . Hypothyroidism   . Irritable bowel syndrome   . Periumbilical hernia   . Pneumonia   . Restless legs   . Tendonitis    chronic in left foot  . Thyroid disease    hypothyroidism     Past Surgical History:  Procedure Laterality Date  . AXILLARY HIDRADENITIS EXCISION Bilateral   . CHOLECYSTECTOMY    . HERNIA REPAIR     multiple  . INCISIONAL HERNIA REPAIR  05/30/2015   LAPROSCOPIC  . INCISIONAL HERNIA REPAIR N/A 05/30/2015   Procedure: LAPAROSCOPIC INCISIONAL HERNIA;  Surgeon: Rolm Bookbinder, MD;  Location: [place];  Service: General;  Laterality: N/A;  . INSERTION OF MESH N/A 05/30/2015   Procedure: INSERTION OF MESH;  Surgeon: Rolm Bookbinder, MD;  Location: [place];  Service: General;  Laterality: N/A;  . LAPAROSCOPY N/A 05/30/2015   Procedure: LAPAROSCOPY DIAGNOSTIC;  Surgeon: Rolm Bookbinder, MD;  Location: [place];  Service: General;  Laterality: N/A;  . TONSILECTOMY, ADENOIDECTOMY, BILATERAL MYRINGOTOMY AND TUBES      Allergies  Allergen Reactions  . Cefuroxime Hives and Swelling    Unspecified location of swelling  . Cefuroxime Axetil Hives  . Sulfa Antibiotics Hives    Social History   Tobacco Use  . Smoking status: Never Smoker  .  Smokeless tobacco: Never Used  Substance Use Topics  . Alcohol use: No    Alcohol/week: 0.0 standard drinks  . Drug use: No    Family History  Problem Relation Age of Onset  . Thyroid cancer Mother   . Diabetes type II Mother   . High Cholesterol Mother   . Heart disease Mother   . Diabetes type II Father   . Hypertension Father   . Stroke Father      Review of Systems  Constitutional: Negative.   HENT: Negative.   Eyes: Negative.   Respiratory: Negative.   Cardiovascular: Negative.   Gastrointestinal: Positive for diarrhea and heartburn.  Genitourinary: Negative.   Musculoskeletal: Positive for joint pain.  Skin: Negative.   Neurological:  Positive for headaches.  Endo/Heme/Allergies: Negative.   Psychiatric/Behavioral: Negative.      Objective:   Physical Exam  Constitutional: She is oriented to person, place, and time. She appears well-developed.  HENT:  Head: Normocephalic.  Eyes: Pupils are equal, round, and reactive to light.  Neck: Neck supple. No JVD present. No tracheal deviation present. No thyromegaly present.  Cardiovascular: Normal rate, regular rhythm and intact distal pulses.  Respiratory: Effort normal and breath sounds normal. No respiratory distress. She has no wheezes.  GI: Soft. There is no tenderness. There is no guarding.  Musculoskeletal:       Right knee: She exhibits decreased range of motion and swelling. She exhibits no ecchymosis, no deformity, no laceration and no erythema. Tenderness found. Lateral joint line tenderness noted. No medial joint line tenderness noted.  Lymphadenopathy:    She has no cervical adenopathy.  Neurological: She is alert and oriented to person, place, and time.  Skin: Skin is warm and dry.  Psychiatric: She has a normal mood and affect.       Labs:  Estimated body mass index is 40.85 kg/m as calculated from the following:   Height as of 04/07/17: 5\' 4"  (1.626 m).   Weight as of 04/07/17: 108 kg.   Imaging Review Plain radiographs demonstrate severe degenerative joint disease of the right knee(s) lateral compartment. The overall alignment is neutral. The bone quality appears to be good for age and reported activity level.  Assessment/Plan:  End stage arthritis, right knee lateral compartment  The patient history, physical examination, clinical judgment of the provider and imaging studies are consistent with end stage degenerative joint disease of the right knee(s) and lateral unicompartmental knee arthroplasty is deemed medically necessary. The treatment options including medical management, injection therapy arthroscopy and arthroplasty were discussed at  length. The risks and benefits of total knee arthroplasty were presented and reviewed. The risks due to aseptic loosening, infection, stiffness, patella tracking problems, thromboembolic complications and other imponderables were discussed. The patient acknowledged the explanation, agreed to proceed with the plan and consent was signed. Patient is being admitted for outpatient / observation treatment for surgery, pain control, PT, OT, prophylactic antibiotics, VTE prophylaxis, progressive ambulation and ADL's and discharge planning. The patient is planning to be discharged to skilled nursing facility / home.     [place] Pugh Caelin Rosen   PA-C  10/13/2017, 10:56 AM

## 2017-10-17 NOTE — Patient Instructions (Addendum)
Wanda Oneill  10/17/2017   Your procedure is scheduled on: Thursday 10/30/2017  Report to [facility] Main  Entrance              Report to admitting at  [place]  AM    Call this number if you have problems the morning of surgery [phone]  How to Manage Your Diabetes Before and After Surgery  Why is it important to control my blood sugar before and after surgery? . Improving blood sugar levels before and after surgery helps healing and can limit problems. . A way of improving blood sugar control is eating a healthy diet by: o  Eating less sugar and carbohydrates o  Increasing activity/exercise o  Talking with your doctor about reaching your blood sugar goals . High blood sugars (greater than 180 mg/dL) can raise your risk of infections and slow your recovery, so you will need to focus on controlling your diabetes during the weeks before surgery. . Make sure that the doctor who takes care of your diabetes knows about your planned surgery including the date and location.  How do I manage my blood sugar before surgery? . Check your blood sugar at least 4 times a day, starting 2 days before surgery, to make sure that the level is not too high or low. o Check your blood sugar the morning of your surgery when you wake up and every 2 hours until you get to the Short Stay unit. . If your blood sugar is less than 70 mg/dL, you will need to treat for low blood sugar: o Do not take insulin. o Treat a low blood sugar (less than 70 mg/dL) with  cup of clear juice (cranberry or apple), 4 glucose tablets, OR glucose gel. o Recheck blood sugar in 15 minutes after treatment (to make sure it is greater than 70 mg/dL). If your blood sugar is not greater than 70 mg/dL on recheck, call [phone] for further instructions. . Report your blood sugar to the short stay nurse when you get to Short Stay.  . If you are admitted to the hospital after surgery: o Your blood sugar will  be checked by the staff and you will probably be given insulin after surgery (instead of oral diabetes medicines) to make sure you have good blood sugar levels. o The goal for blood sugar control after surgery is 80-180 mg/dL.   WHAT DO I DO ABOUT MY DIABETES MEDICATION?       The day before surgery, Take Metformin as usual.        The day before surgery, take Empagliflozin (Jardiance)as usual.  . Do not take oral diabetes medicines (pills) the morning of surgery.        Remember: Do not eat food or drink liquids :After Midnight.   BRUSH YOUR TEETH MORNING OF SURGERY AND RINSE YOUR MOUTH OUT, NO CHEWING GUM CANDY OR MINTS.     Take these medicines the morning of surgery with A SIP OF WATER: none   DO NOT TAKE ANY DIABETIC MEDICATIONS DAY OF YOUR SURGERY                               You may not have any metal on your body including hair pins and              piercings  Do not wear jewelry, make-up, lotions, powders or perfumes, deodorant             Do not wear nail polish.  Do not shave  48 hours prior to surgery.             Do not bring valuables to the hospital. [place].  Contacts, dentures or bridgework may not be worn into surgery.  Leave suitcase in the car. After surgery it may be brought to your room.                  Please read over the following fact sheets you were given: _____________________________________________________________________             [facility] - Preparing for Surgery Before surgery, you can play an important role.  Because skin is not sterile, your skin needs to be as free of germs as possible.  You can reduce the number of germs on your skin by washing with CHG (chlorahexidine gluconate) soap before surgery.  CHG is an antiseptic cleaner which kills germs and bonds with the skin to continue killing germs even after washing. Please DO NOT use if you have an allergy to CHG or antibacterial  soaps.  If your skin becomes reddened/irritated stop using the CHG and inform your nurse when you arrive at Short Stay. Do not shave (including legs and underarms) for at least 48 hours prior to the first CHG shower.  You may shave your face/neck. Please follow these instructions carefully:  1.  Shower with CHG Soap the night before surgery and the  morning of Surgery.  2.  If you choose to wash your hair, wash your hair first as usual with your  normal  shampoo.  3.  After you shampoo, rinse your hair and body thoroughly to remove the  shampoo.                           4.  Use CHG as you would any other liquid soap.  You can apply chg directly  to the skin and wash                       Gently with a scrungie or clean washcloth.  5.  Apply the CHG Soap to your body ONLY FROM THE NECK DOWN.   Do not use on face/ open                           Wound or open sores. Avoid contact with eyes, ears mouth and genitals (private parts).                       Wash face,  Genitals (private parts) with your normal soap.             6.  Wash thoroughly, paying special attention to the area where your surgery  will be performed.  7.  Thoroughly rinse your body with warm water from the neck down.  8.  DO NOT shower/wash with your normal soap after using and rinsing off  the CHG Soap.                9.  Pat yourself dry with a clean towel.  10.  Wear clean pajamas.            11.  Place clean sheets on your bed the night of your first shower and do not  sleep with pets. Day of Surgery : Do not apply any lotions/deodorants the morning of surgery.  Please wear clean clothes to the hospital/surgery center.  FAILURE TO FOLLOW THESE INSTRUCTIONS MAY RESULT IN THE CANCELLATION OF YOUR SURGERY PATIENT SIGNATURE_________________________________  NURSE SIGNATURE__________________________________  ________________________________________________________________________   [name]  An  incentive spirometer is a tool that can help keep your lungs clear and active. This tool measures how well you are filling your lungs with each breath. Taking long deep breaths may help reverse or decrease the chance of developing breathing (pulmonary) problems (especially infection) following:  A long period of time when you are unable to move or be active. BEFORE THE PROCEDURE   If the spirometer includes an indicator to show your best effort, your nurse or respiratory therapist will set it to a desired goal.  If possible, sit up straight or lean slightly forward. Try not to slouch.  Hold the incentive spirometer in an upright position. INSTRUCTIONS FOR USE  1. Sit on the edge of your bed if possible, or sit up as far as you can in bed or on a chair. 2. Hold the incentive spirometer in an upright position. 3. Breathe out normally. 4. Place the mouthpiece in your mouth and seal your lips tightly around it. 5. Breathe in slowly and as deeply as possible, raising the piston or the ball toward the top of the column. 6. Hold your breath for 3-5 seconds or for as long as possible. Allow the piston or ball to fall to the bottom of the column. 7. Remove the mouthpiece from your mouth and breathe out normally. 8. Rest for a few seconds and repeat Steps 1 through 7 at least 10 times every 1-2 hours when you are awake. Take your time and take a few normal breaths between deep breaths. 9. The spirometer may include an indicator to show your best effort. Use the indicator as a goal to work toward during each repetition. 10. After each set of 10 deep breaths, practice coughing to be sure your lungs are clear. If you have an incision (the cut made at the time of surgery), support your incision when coughing by placing a pillow or rolled up towels firmly against it. Once you are able to get out of bed, walk around indoors and cough well. You may stop using the incentive spirometer when instructed by your  caregiver.  RISKS AND COMPLICATIONS  Take your time so you do not get dizzy or light-headed.  If you are in pain, you may need to take or ask for pain medication before doing incentive spirometry. It is harder to take a deep breath if you are having pain. AFTER USE  Rest and breathe slowly and easily.  It can be helpful to keep track of a log of your progress. Your caregiver can provide you with a simple table to help with this. If you are using the spirometer at home, follow these instructions: [place] IF:   You are having difficultly using the spirometer.  You have trouble using the spirometer as often as instructed.  Your pain medication is not giving enough relief while using the spirometer.  You develop fever of 100.5 F (38.1 C) or higher. SEEK IMMEDIATE MEDICAL CARE IF:   You cough up bloody  sputum that had not been present before.  You develop fever of 102 F (38.9 C) or greater.  You develop worsening pain at or near the incision site. MAKE SURE YOU:   Understand these instructions.  Will watch your condition.  Will get help right away if you are not doing well or get worse. Document Released: 06/03/2006 Document Revised: 04/15/2011 Document Reviewed: 08/04/2006 Exit[employer] Patient Information 2014 [employer], [place].   ________________________________________________________________________  WHAT IS A BLOOD TRANSFUSION? Blood Transfusion Information  A transfusion is the replacement of blood or some of its parts. Blood is made up of multiple cells which provide different functions.  Red blood cells carry oxygen and are used for blood loss replacement.  White blood cells fight against infection.  Platelets control bleeding.  Plasma helps clot blood.  Other blood products are available for specialized needs, such as hemophilia or other clotting disorders. BEFORE THE TRANSFUSION  Who gives blood for transfusions?   Healthy volunteers who are fully  evaluated to make sure their blood is safe. This is blood bank blood. Transfusion therapy is the safest it has ever been in the practice of medicine. Before blood is taken from a donor, a complete history is taken to make sure that person has no history of diseases nor engages in risky social behavior (examples are intravenous drug use or sexual activity with multiple partners). The donor's travel history is screened to minimize risk of transmitting infections, such as malaria. The donated blood is tested for signs of infectious diseases, such as HIV and hepatitis. The blood is then tested to be sure it is compatible with you in order to minimize the chance of a transfusion reaction. If you or a relative donates blood, this is often done in anticipation of surgery and is not appropriate for emergency situations. It takes many days to process the donated blood. RISKS AND COMPLICATIONS Although transfusion therapy is very safe and saves many lives, the main dangers of transfusion include:   Getting an infectious disease.  Developing a transfusion reaction. This is an allergic reaction to something in the blood you were given. Every precaution is taken to prevent this. The decision to have a blood transfusion has been considered carefully by your caregiver before blood is given. Blood is not given unless the benefits outweigh the risks. AFTER THE TRANSFUSION  Right after receiving a blood transfusion, you will usually feel much better and more energetic. This is especially true if your red blood cells have gotten low (anemic). The transfusion raises the level of the red blood cells which carry oxygen, and this usually causes an energy increase.  The nurse administering the transfusion will monitor you carefully for complications. HOME CARE INSTRUCTIONS  No special instructions are needed after a transfusion. You may find your energy is better. Speak with your caregiver about any limitations on activity  for underlying diseases you may have. SEEK MEDICAL CARE IF:   Your condition is not improving after your transfusion.  You develop redness or irritation at the intravenous (IV) site. SEEK IMMEDIATE MEDICAL CARE IF:  Any of the following symptoms occur over the next 12 hours:  Shaking chills.  You have a temperature by mouth above 102 F (38.9 C), not controlled by medicine.  Chest, back, or muscle pain.  People around you feel you are not acting correctly or are confused.  Shortness of breath or difficulty breathing.  Dizziness and fainting.  You get a rash or develop hives.  You have a  decrease in urine output.  Your urine turns a dark color or changes to pink, red, or brown. Any of the following symptoms occur over the next 10 days:  You have a temperature by mouth above 102 F (38.9 C), not controlled by medicine.  Shortness of breath.  Weakness after normal activity.  The white part of the eye turns yellow (jaundice).  You have a decrease in the amount of urine or are urinating less often.  Your urine turns a dark color or changes to pink, red, or brown. Document Released: 01/19/2000 Document Revised: 04/15/2011 Document Reviewed: 09/07/2007 [facility] Patient Information 2014 [place], [place].  _______________________________________________________________________

## 2017-10-20 ENCOUNTER — Other Ambulatory Visit: Payer: Self-pay

## 2017-10-20 ENCOUNTER — Encounter (HOSPITAL_COMMUNITY)
Admission: RE | Admit: 2017-10-20 | Discharge: 2017-10-20 | Disposition: A | Discharge status: Home / Self Care | Payer: Medicare Other | Source: Ambulatory Visit | Attending: Orthopedic Surgery | Admitting: Orthopedic Surgery

## 2017-10-20 ENCOUNTER — Encounter (HOSPITAL_COMMUNITY): Payer: Self-pay

## 2017-10-20 DIAGNOSIS — Z01818 Encounter for other preprocedural examination: Secondary | ICD-10-CM | POA: Diagnosis present

## 2017-10-20 DIAGNOSIS — E119 Type 2 diabetes mellitus without complications: Secondary | ICD-10-CM | POA: Insufficient documentation

## 2017-10-20 DIAGNOSIS — M1711 Unilateral primary osteoarthritis, right knee: Secondary | ICD-10-CM | POA: Diagnosis not present

## 2017-10-20 LAB — BASIC METABOLIC PANEL
Anion gap: 12 (ref 5–15)
BUN: 15 mg/dL (ref 6–20)
CO2: 27 mmol/L (ref 22–32)
CREATININE: 0.68 mg/dL (ref 0.44–1.00)
Calcium: 9.4 mg/dL (ref 8.9–10.3)
Chloride: 101 mmol/L (ref 98–111)
GFR calc Af Amer: >60 mL/min (ref >60)
Glucose, Bld: 172 mg/dL — ABNORMAL HIGH (ref 70–99)
Potassium: 4.7 mmol/L (ref 3.5–5.1)
SODIUM: 140 mmol/L (ref 135–145)

## 2017-10-20 LAB — CBC
HCT: 43.9 % (ref 36.0–46.0)
Hemoglobin: 14.1 g/dL (ref 12.0–15.0)
MCH: 29.5 pg (ref 26.0–34.0)
MCHC: 32.1 g/dL (ref 30.0–36.0)
MCV: 91.8 fL (ref 78.0–100.0)
PLATELETS: 300 10*3/uL (ref 150–400)
RBC: 4.78 MIL/uL (ref 3.87–5.11)
RDW: 15.7 % — AB (ref 11.5–15.5)
WBC: 7.4 10*3/uL (ref 4.0–10.5)

## 2017-10-20 LAB — SURGICAL PCR SCREEN
MRSA, PCR: NEGATIVE
STAPHYLOCOCCUS AUREUS: NEGATIVE

## 2017-10-20 LAB — GLUCOSE, CAPILLARY: GLUCOSE-CAPILLARY: 203 mg/dL — AB (ref 70–99)

## 2017-10-20 LAB — HCG, SERUM, QUALITATIVE: PREG SERUM: NEGATIVE

## 2017-10-20 LAB — ABO/RH: ABO/RH(D): A POS

## 2017-10-21 LAB — HEMOGLOBIN A1C
Hgb A1c MFr Bld: 7.2 % — ABNORMAL HIGH (ref 4.8–5.6)
Mean Plasma Glucose: 160 mg/dL

## 2017-10-21 NOTE — Pre-Procedure Instructions (Signed)
Hgb A1C results 10/20/2017 faxed to Dr. Alvan Dame via epic.

## 2017-10-22 NOTE — Pre-Procedure Instructions (Signed)
Dr. Nyoka Cowden made aware of EKG results 10/20/2017, no new orders received at this time.

## 2017-10-29 ENCOUNTER — Other Ambulatory Visit: Payer: Self-pay | Admitting: Internal Medicine

## 2017-10-29 MED ORDER — GENTAMICIN SULFATE 40 MG/ML IJ SOLN
5.0000 mg/kg | INTRAVENOUS | Status: DC
  Filled 2017-10-29: qty 9.5

## 2017-10-29 MED ORDER — VANCOMYCIN HCL 10 G IV SOLR
1500.0000 mg | INTRAVENOUS | Status: AC
  Administered 2017-10-30: 1500 mg via INTRAVENOUS
  Filled 2017-10-29: qty 1500

## 2017-10-30 ENCOUNTER — Encounter (HOSPITAL_COMMUNITY)
Admission: RE | Disposition: A | Discharge status: Home / Self Care | Payer: Self-pay | Source: Ambulatory Visit | Attending: Orthopedic Surgery

## 2017-10-30 ENCOUNTER — Other Ambulatory Visit: Payer: Self-pay

## 2017-10-30 ENCOUNTER — Ambulatory Visit (HOSPITAL_COMMUNITY): Payer: Medicare Other | Admitting: Anesthesiology

## 2017-10-30 ENCOUNTER — Encounter (HOSPITAL_COMMUNITY): Payer: Self-pay | Admitting: *Deleted

## 2017-10-30 ENCOUNTER — Observation Stay (HOSPITAL_COMMUNITY)
Admission: RE | Admit: 2017-10-30 | Discharge: 2017-11-02 | Disposition: A | Discharge status: Home / Self Care | Payer: Medicare Other | Source: Ambulatory Visit | Attending: Orthopedic Surgery | Admitting: Orthopedic Surgery

## 2017-10-30 DIAGNOSIS — Z7982 Long term (current) use of aspirin: Secondary | ICD-10-CM | POA: Diagnosis not present

## 2017-10-30 DIAGNOSIS — Z8249 Family history of ischemic heart disease and other diseases of the circulatory system: Secondary | ICD-10-CM | POA: Insufficient documentation

## 2017-10-30 DIAGNOSIS — E119 Type 2 diabetes mellitus without complications: Secondary | ICD-10-CM | POA: Insufficient documentation

## 2017-10-30 DIAGNOSIS — Z881 Allergy status to other antibiotic agents status: Secondary | ICD-10-CM | POA: Diagnosis not present

## 2017-10-30 DIAGNOSIS — K589 Irritable bowel syndrome without diarrhea: Secondary | ICD-10-CM | POA: Diagnosis not present

## 2017-10-30 DIAGNOSIS — G2581 Restless legs syndrome: Secondary | ICD-10-CM | POA: Diagnosis not present

## 2017-10-30 DIAGNOSIS — M1711 Unilateral primary osteoarthritis, right knee: Secondary | ICD-10-CM | POA: Diagnosis not present

## 2017-10-30 DIAGNOSIS — Z882 Allergy status to sulfonamides status: Secondary | ICD-10-CM | POA: Insufficient documentation

## 2017-10-30 DIAGNOSIS — Z888 Allergy status to other drugs, medicaments and biological substances status: Secondary | ICD-10-CM | POA: Insufficient documentation

## 2017-10-30 DIAGNOSIS — K219 Gastro-esophageal reflux disease without esophagitis: Secondary | ICD-10-CM | POA: Diagnosis not present

## 2017-10-30 DIAGNOSIS — E039 Hypothyroidism, unspecified: Secondary | ICD-10-CM | POA: Diagnosis not present

## 2017-10-30 DIAGNOSIS — Z7984 Long term (current) use of oral hypoglycemic drugs: Secondary | ICD-10-CM | POA: Diagnosis not present

## 2017-10-30 DIAGNOSIS — Z96651 Presence of right artificial knee joint: Secondary | ICD-10-CM

## 2017-10-30 DIAGNOSIS — Q909 Down syndrome, unspecified: Secondary | ICD-10-CM | POA: Diagnosis not present

## 2017-10-30 DIAGNOSIS — Z79899 Other long term (current) drug therapy: Secondary | ICD-10-CM | POA: Insufficient documentation

## 2017-10-30 DIAGNOSIS — Z6841 Body Mass Index (BMI) 40.0 and over, adult: Secondary | ICD-10-CM | POA: Insufficient documentation

## 2017-10-30 HISTORY — PX: PARTIAL KNEE ARTHROPLASTY: SHX2174

## 2017-10-30 LAB — GLUCOSE, CAPILLARY
GLUCOSE-CAPILLARY: 168 mg/dL — AB (ref 70–99)
GLUCOSE-CAPILLARY: 131 mg/dL — AB (ref 70–99)
Glucose-Capillary: 144 mg/dL — ABNORMAL HIGH (ref 70–99)
Glucose-Capillary: 172 mg/dL — ABNORMAL HIGH (ref 70–99)

## 2017-10-30 LAB — TYPE AND SCREEN
ABO/RH(D): A POS
ANTIBODY SCREEN: NEGATIVE

## 2017-10-30 SURGERY — ARTHROPLASTY, KNEE, UNICOMPARTMENTAL
Anesthesia: Spinal | Site: Knee | Laterality: Right

## 2017-10-30 MED ORDER — CLINDAMYCIN PHOSPHATE 600 MG/50ML IV SOLN
600.0000 mg | Freq: Four times a day (QID) | INTRAVENOUS | Status: AC
  Administered 2017-10-30 (×2): 600 mg via INTRAVENOUS
  Filled 2017-10-30 (×2): qty 50

## 2017-10-30 MED ORDER — SODIUM CHLORIDE 0.9 % IJ SOLN
INTRAMUSCULAR | Status: AC
  Filled 2017-10-30: qty 20

## 2017-10-30 MED ORDER — DOCUSATE SODIUM 100 MG PO CAPS
100.0000 mg | ORAL_CAPSULE | Freq: Two times a day (BID) | ORAL | Status: DC
  Filled 2017-10-30 (×3): qty 1

## 2017-10-30 MED ORDER — LORATADINE 10 MG PO TABS
10.0000 mg | ORAL_TABLET | Freq: Every day | ORAL | Status: DC
  Administered 2017-10-31 – 2017-11-02 (×3): 10 mg via ORAL
  Filled 2017-10-30 (×4): qty 1

## 2017-10-30 MED ORDER — ONDANSETRON HCL 4 MG/2ML IJ SOLN
4.0000 mg | Freq: Four times a day (QID) | INTRAMUSCULAR | Status: DC | PRN
  Administered 2017-10-31: 4 mg via INTRAVENOUS
  Filled 2017-10-30: qty 2

## 2017-10-30 MED ORDER — CLINDAMYCIN PHOSPHATE 900 MG/50ML IV SOLN
900.0000 mg | Freq: Once | INTRAVENOUS | Status: AC
  Administered 2017-10-30: 900 mg via INTRAVENOUS

## 2017-10-30 MED ORDER — PROPOFOL 500 MG/50ML IV EMUL
INTRAVENOUS | Status: DC | PRN
  Administered 2017-10-30: 100 ug/kg/min via INTRAVENOUS

## 2017-10-30 MED ORDER — ROPIVACAINE HCL 7.5 MG/ML IJ SOLN
INTRAMUSCULAR | Status: DC | PRN
  Administered 2017-10-30: 20 mL via PERINEURAL

## 2017-10-30 MED ORDER — MORPHINE SULFATE (PF) 2 MG/ML IV SOLN
0.5000 mg | INTRAVENOUS | Status: DC | PRN
  Administered 2017-10-30: 0.5 mg via INTRAVENOUS
  Administered 2017-10-31: 1 mg via INTRAVENOUS
  Filled 2017-10-30 (×2): qty 1

## 2017-10-30 MED ORDER — LACTATED RINGERS IV SOLN
INTRAVENOUS | Status: DC
  Administered 2017-10-30: 07:00:00 via INTRAVENOUS

## 2017-10-30 MED ORDER — FERROUS SULFATE 325 (65 FE) MG PO TABS
325.0000 mg | ORAL_TABLET | Freq: Two times a day (BID) | ORAL | Status: DC
  Administered 2017-10-30: 325 mg via ORAL
  Filled 2017-10-30 (×5): qty 1

## 2017-10-30 MED ORDER — HYDROCODONE-ACETAMINOPHEN 5-325 MG PO TABS
1.0000 | ORAL_TABLET | ORAL | Status: DC | PRN
  Administered 2017-10-31: 1 via ORAL
  Administered 2017-11-01 (×2): 2 via ORAL
  Administered 2017-11-01: 1 via ORAL
  Administered 2017-11-02 (×2): 2 via ORAL
  Filled 2017-10-30 (×4): qty 2
  Filled 2017-10-30: qty 1
  Filled 2017-10-30: qty 2
  Filled 2017-10-30: qty 1

## 2017-10-30 MED ORDER — DEXAMETHASONE SODIUM PHOSPHATE 10 MG/ML IJ SOLN
10.0000 mg | Freq: Once | INTRAMUSCULAR | Status: AC
  Administered 2017-10-31: 10 mg via INTRAVENOUS
  Filled 2017-10-30: qty 1

## 2017-10-30 MED ORDER — ONDANSETRON HCL 4 MG/2ML IJ SOLN
INTRAMUSCULAR | Status: DC | PRN
  Administered 2017-10-30: 4 mg via INTRAVENOUS

## 2017-10-30 MED ORDER — CLINDAMYCIN PHOSPHATE 900 MG/50ML IV SOLN
INTRAVENOUS | Status: AC
  Administered 2017-10-30: 900 mg via INTRAVENOUS
  Filled 2017-10-30: qty 50

## 2017-10-30 MED ORDER — PROPOFOL 10 MG/ML IV BOLUS
INTRAVENOUS | Status: AC
  Filled 2017-10-30: qty 40

## 2017-10-30 MED ORDER — FENTANYL CITRATE (PF) 100 MCG/2ML IJ SOLN
INTRAMUSCULAR | Status: AC
  Administered 2017-10-30: 100 ug
  Filled 2017-10-30: qty 2

## 2017-10-30 MED ORDER — HYDROMORPHONE HCL 1 MG/ML IJ SOLN: 0.2500 mg | INTRAMUSCULAR | Status: DC | PRN

## 2017-10-30 MED ORDER — SODIUM CHLORIDE 0.9 % IJ SOLN
INTRAMUSCULAR | Status: DC | PRN
  Administered 2017-10-30: 30 mL via INTRAVENOUS

## 2017-10-30 MED ORDER — METOCLOPRAMIDE HCL 5 MG PO TABS: 5.0000 mg | ORAL_TABLET | Freq: Three times a day (TID) | ORAL | Status: DC | PRN

## 2017-10-30 MED ORDER — CHLORHEXIDINE GLUCONATE 4 % EX LIQD: 60.0000 mL | Freq: Once | CUTANEOUS | Status: DC

## 2017-10-30 MED ORDER — BUPIVACAINE IN DEXTROSE 0.75-8.25 % IT SOLN
INTRATHECAL | Status: DC | PRN
  Administered 2017-10-30: 1.6 mg via INTRATHECAL

## 2017-10-30 MED ORDER — ACETAMINOPHEN 325 MG PO TABS
325.0000 mg | ORAL_TABLET | Freq: Four times a day (QID) | ORAL | Status: DC | PRN
  Administered 2017-10-31 – 2017-11-01 (×2): 650 mg via ORAL
  Filled 2017-10-30 (×2): qty 2

## 2017-10-30 MED ORDER — KETOROLAC TROMETHAMINE 30 MG/ML IJ SOLN
INTRAMUSCULAR | Status: AC
  Filled 2017-10-30: qty 1

## 2017-10-30 MED ORDER — METOCLOPRAMIDE HCL 5 MG/ML IJ SOLN: 5.0000 mg | Freq: Three times a day (TID) | INTRAMUSCULAR | Status: DC | PRN

## 2017-10-30 MED ORDER — HYDROCODONE-ACETAMINOPHEN 7.5-325 MG PO TABS
1.0000 | ORAL_TABLET | ORAL | Status: DC | PRN
  Administered 2017-10-30 – 2017-10-31 (×4): 1 via ORAL
  Filled 2017-10-30: qty 1
  Filled 2017-10-30: qty 2
  Filled 2017-10-30 (×2): qty 1

## 2017-10-30 MED ORDER — PHENYLEPHRINE 40 MCG/ML (10ML) SYRINGE FOR IV PUSH (FOR BLOOD PRESSURE SUPPORT)
PREFILLED_SYRINGE | INTRAVENOUS | Status: DC | PRN
  Administered 2017-10-30: 80 ug via INTRAVENOUS

## 2017-10-30 MED ORDER — MIDAZOLAM HCL 2 MG/2ML IJ SOLN
INTRAMUSCULAR | Status: AC
  Filled 2017-10-30: qty 2

## 2017-10-30 MED ORDER — TRANEXAMIC ACID 1000 MG/10ML IV SOLN
1000.0000 mg | INTRAVENOUS | Status: DC
  Filled 2017-10-30: qty 10

## 2017-10-30 MED ORDER — METHOCARBAMOL 500 MG PO TABS
500.0000 mg | ORAL_TABLET | Freq: Four times a day (QID) | ORAL | Status: DC | PRN
  Administered 2017-10-31 – 2017-11-01 (×3): 500 mg via ORAL
  Filled 2017-10-30 (×5): qty 1

## 2017-10-30 MED ORDER — ASPIRIN 81 MG PO CHEW
81.0000 mg | CHEWABLE_TABLET | Freq: Two times a day (BID) | ORAL | Status: DC
  Administered 2017-10-30 – 2017-11-02 (×6): 81 mg via ORAL
  Filled 2017-10-30 (×6): qty 1

## 2017-10-30 MED ORDER — MIDAZOLAM HCL 2 MG/2ML IJ SOLN
INTRAMUSCULAR | Status: AC
  Administered 2017-10-30: 1 mg via INTRAVENOUS
  Filled 2017-10-30: qty 2

## 2017-10-30 MED ORDER — KETOROLAC TROMETHAMINE 30 MG/ML IJ SOLN
INTRAMUSCULAR | Status: DC | PRN
  Administered 2017-10-30: 30 mg via INTRAVENOUS

## 2017-10-30 MED ORDER — PROPOFOL 10 MG/ML IV BOLUS
INTRAVENOUS | Status: AC
  Filled 2017-10-30: qty 20

## 2017-10-30 MED ORDER — MIDAZOLAM HCL 5 MG/5ML IJ SOLN
INTRAMUSCULAR | Status: DC | PRN
  Administered 2017-10-30: 1 mg via INTRAVENOUS

## 2017-10-30 MED ORDER — INSULIN ASPART 100 UNIT/ML ~~LOC~~ SOLN
0.0000 [IU] | Freq: Three times a day (TID) | SUBCUTANEOUS | Status: DC
  Administered 2017-10-30: 2 [IU] via SUBCUTANEOUS
  Administered 2017-10-31 – 2017-11-01 (×6): 3 [IU] via SUBCUTANEOUS
  Administered 2017-11-02: 5 [IU] via SUBCUTANEOUS
  Administered 2017-11-02: 2 [IU] via SUBCUTANEOUS

## 2017-10-30 MED ORDER — SODIUM CHLORIDE 0.9 % IV SOLN
INTRAVENOUS | Status: DC
  Administered 2017-10-30 – 2017-10-31 (×2): via INTRAVENOUS

## 2017-10-30 MED ORDER — TRAMADOL HCL 50 MG PO TABS
50.0000 mg | ORAL_TABLET | Freq: Four times a day (QID) | ORAL | Status: DC
  Filled 2017-10-30 (×3): qty 1

## 2017-10-30 MED ORDER — ONDANSETRON HCL 4 MG/2ML IJ SOLN
INTRAMUSCULAR | Status: AC
  Filled 2017-10-30: qty 2

## 2017-10-30 MED ORDER — 0.9 % SODIUM CHLORIDE (POUR BTL) OPTIME
TOPICAL | Status: DC | PRN
  Administered 2017-10-30: 1000 mL

## 2017-10-30 MED ORDER — BUPIVACAINE-EPINEPHRINE 0.25% -1:200000 IJ SOLN
INTRAMUSCULAR | Status: DC | PRN
  Administered 2017-10-30: 30 mL

## 2017-10-30 MED ORDER — DEXAMETHASONE SODIUM PHOSPHATE 10 MG/ML IJ SOLN: 10.0000 mg | Freq: Once | INTRAMUSCULAR | Status: DC

## 2017-10-30 MED ORDER — HYDROMORPHONE HCL 1 MG/ML IJ SOLN
INTRAMUSCULAR | Status: AC
  Filled 2017-10-30: qty 1

## 2017-10-30 MED ORDER — LIP MEDEX EX OINT
TOPICAL_OINTMENT | CUTANEOUS | Status: AC
  Filled 2017-10-30: qty 7

## 2017-10-30 MED ORDER — METHOCARBAMOL 500 MG IVPB - SIMPLE MED
INTRAVENOUS | Status: AC
  Filled 2017-10-30: qty 50

## 2017-10-30 MED ORDER — ONDANSETRON HCL 4 MG PO TABS
4.0000 mg | ORAL_TABLET | Freq: Four times a day (QID) | ORAL | Status: DC | PRN
  Administered 2017-10-30: 4 mg via ORAL
  Filled 2017-10-30: qty 1

## 2017-10-30 MED ORDER — MENTHOL 3 MG MT LOZG: 1.0000 | LOZENGE | OROMUCOSAL | Status: DC | PRN

## 2017-10-30 MED ORDER — MAGNESIUM CITRATE PO SOLN: 1.0000 | Freq: Once | ORAL | Status: DC | PRN

## 2017-10-30 MED ORDER — BISACODYL 10 MG RE SUPP: 10.0000 mg | Freq: Every day | RECTAL | Status: DC | PRN

## 2017-10-30 MED ORDER — CANAGLIFLOZIN 100 MG PO TABS
100.0000 mg | ORAL_TABLET | Freq: Every day | ORAL | Status: DC
  Administered 2017-10-31 – 2017-11-02 (×3): 100 mg via ORAL
  Filled 2017-10-30 (×3): qty 1

## 2017-10-30 MED ORDER — PROMETHAZINE HCL 25 MG/ML IJ SOLN: 6.2500 mg | INTRAMUSCULAR | Status: DC | PRN

## 2017-10-30 MED ORDER — CHOLESTYRAMINE LIGHT 4 G PO PACK
4.0000 g | PACK | ORAL | Status: DC
  Administered 2017-10-31 – 2017-11-02 (×3): 4 g via ORAL
  Filled 2017-10-30 (×4): qty 1

## 2017-10-30 MED ORDER — BUPIVACAINE-EPINEPHRINE (PF) 0.25% -1:200000 IJ SOLN
INTRAMUSCULAR | Status: AC
  Filled 2017-10-30: qty 30

## 2017-10-30 MED ORDER — SODIUM CHLORIDE 0.9 % IJ SOLN
INTRAMUSCULAR | Status: AC
  Filled 2017-10-30: qty 10

## 2017-10-30 MED ORDER — LEVOTHYROXINE SODIUM 25 MCG PO TABS
137.0000 ug | ORAL_TABLET | Freq: Every day | ORAL | Status: DC
  Administered 2017-10-30 – 2017-11-01 (×3): 137 ug via ORAL
  Filled 2017-10-30 (×3): qty 1

## 2017-10-30 MED ORDER — ALUM & MAG HYDROXIDE-SIMETH 200-200-20 MG/5ML PO SUSP: 15.0000 mL | ORAL | Status: DC | PRN

## 2017-10-30 MED ORDER — DICYCLOMINE HCL 20 MG PO TABS
20.0000 mg | ORAL_TABLET | Freq: Three times a day (TID) | ORAL | Status: DC
  Administered 2017-10-30 – 2017-10-31 (×4): 20 mg via ORAL
  Filled 2017-10-30 (×6): qty 1

## 2017-10-30 MED ORDER — GLYCOPYRROLATE PF 0.2 MG/ML IJ SOSY
PREFILLED_SYRINGE | INTRAMUSCULAR | Status: DC | PRN
  Administered 2017-10-30: .2 mg via INTRAVENOUS

## 2017-10-30 MED ORDER — MIDAZOLAM HCL 2 MG/2ML IJ SOLN
1.0000 mg | INTRAMUSCULAR | Status: DC
  Administered 2017-10-30: 1 mg via INTRAVENOUS

## 2017-10-30 MED ORDER — PHENYLEPHRINE 40 MCG/ML (10ML) SYRINGE FOR IV PUSH (FOR BLOOD PRESSURE SUPPORT)
PREFILLED_SYRINGE | INTRAVENOUS | Status: AC
  Filled 2017-10-30: qty 10

## 2017-10-30 MED ORDER — TRANEXAMIC ACID 1000 MG/10ML IV SOLN
1000.0000 mg | Freq: Once | INTRAVENOUS | Status: AC
  Administered 2017-10-30: 1000 mg via INTRAVENOUS
  Filled 2017-10-30: qty 1000

## 2017-10-30 MED ORDER — PHENOL 1.4 % MT LIQD: 1.0000 | OROMUCOSAL | Status: DC | PRN

## 2017-10-30 MED ORDER — METHOCARBAMOL 500 MG IVPB - SIMPLE MED
500.0000 mg | Freq: Four times a day (QID) | INTRAVENOUS | Status: DC | PRN
  Administered 2017-10-30: 500 mg via INTRAVENOUS
  Filled 2017-10-30: qty 50

## 2017-10-30 MED ORDER — GENTAMICIN SULFATE 40 MG/ML IJ SOLN
5.0000 mg/kg | INTRAVENOUS | Status: DC
  Filled 2017-10-30: qty 9.5

## 2017-10-30 MED ORDER — ALLOPURINOL 300 MG PO TABS
300.0000 mg | ORAL_TABLET | Freq: Every day | ORAL | Status: DC
  Administered 2017-10-30 – 2017-11-02 (×4): 300 mg via ORAL
  Filled 2017-10-30 (×4): qty 1

## 2017-10-30 MED ORDER — CHOLESTYRAMINE 4 G PO PACK
4.0000 g | PACK | Freq: Every day | ORAL | Status: DC
  Filled 2017-10-30: qty 1

## 2017-10-30 MED ORDER — POLYETHYLENE GLYCOL 3350 17 G PO PACK
17.0000 g | PACK | Freq: Two times a day (BID) | ORAL | Status: DC
  Filled 2017-10-30 (×3): qty 1

## 2017-10-30 MED ORDER — METFORMIN HCL 500 MG PO TABS
1000.0000 mg | ORAL_TABLET | Freq: Two times a day (BID) | ORAL | Status: DC
  Administered 2017-10-30 – 2017-11-02 (×6): 1000 mg via ORAL
  Filled 2017-10-30 (×7): qty 2

## 2017-10-30 MED ORDER — DIPHENHYDRAMINE HCL 12.5 MG/5ML PO ELIX: 12.5000 mg | ORAL_SOLUTION | ORAL | Status: DC | PRN

## 2017-10-30 SURGICAL SUPPLY — 52 items
BAG DECANTER FOR FLEXI CONT (MISCELLANEOUS) IMPLANT
BAG ZIPLOCK 12X15 (MISCELLANEOUS) IMPLANT
BANDAGE ACE 6X5 VEL STRL LF (GAUZE/BANDAGES/DRESSINGS) ×2 IMPLANT
BLADE SAW RECIPROCATING 77.5 (BLADE) ×2 IMPLANT
BLADE SAW SGTL 13.0X1.19X90.0M (BLADE) ×2 IMPLANT
BONE CEMENT GENTAMICIN (Cement) ×2 IMPLANT
BOWL SMART MIX CTS (DISPOSABLE) ×2 IMPLANT
CEMENT BONE GENTAMICIN 40 (Cement) ×1 IMPLANT
COVER SURGICAL LIGHT HANDLE (MISCELLANEOUS) ×2 IMPLANT
CUFF TOURN SGL QUICK 34 (TOURNIQUET CUFF) ×1
CUFF TRNQT CYL 34X4X40X1 (TOURNIQUET CUFF) ×1 IMPLANT
DECANTER SPIKE VIAL GLASS SM (MISCELLANEOUS) ×2 IMPLANT
DERMABOND ADVANCED (GAUZE/BANDAGES/DRESSINGS) ×1
DERMABOND ADVANCED .7 DNX12 (GAUZE/BANDAGES/DRESSINGS) ×1 IMPLANT
DRAPE U-SHAPE 47X51 STRL (DRAPES) ×2 IMPLANT
DRESSING AQUACEL AG SP 3.5X10 (GAUZE/BANDAGES/DRESSINGS) ×1 IMPLANT
DRSG AQUACEL AG SP 3.5X10 (GAUZE/BANDAGES/DRESSINGS) ×2
DURAPREP 26ML APPLICATOR (WOUND CARE) ×4 IMPLANT
ELECT REM PT RETURN 15FT ADLT (MISCELLANEOUS) ×2 IMPLANT
GLOVE BIOGEL M 7.0 STRL (GLOVE) ×4 IMPLANT
GLOVE BIOGEL PI IND STRL 6.5 (GLOVE) ×3 IMPLANT
GLOVE BIOGEL PI IND STRL 7.5 (GLOVE) ×2 IMPLANT
GLOVE BIOGEL PI IND STRL 8.5 (GLOVE) ×1 IMPLANT
GLOVE BIOGEL PI INDICATOR 6.5 (GLOVE) ×3
GLOVE BIOGEL PI INDICATOR 7.5 (GLOVE) ×2
GLOVE BIOGEL PI INDICATOR 8.5 (GLOVE) ×1
GLOVE ECLIPSE 8.0 STRL XLNG CF (GLOVE) ×2 IMPLANT
GLOVE ORTHO TXT STRL SZ7.5 (GLOVE) ×4 IMPLANT
GOWN STRL REUS W/TWL 2XL LVL3 (GOWN DISPOSABLE) ×2 IMPLANT
GOWN STRL REUS W/TWL LRG LVL3 (GOWN DISPOSABLE) ×2 IMPLANT
GOWN STRL REUS W/TWL XL LVL3 (GOWN DISPOSABLE) ×2 IMPLANT
HOLDER FOLEY CATH W/STRAP (MISCELLANEOUS) ×2 IMPLANT
KNEE PARTIAL CEMENT FEM XS (Miscellaneous) ×2 IMPLANT
LEGGING LITHOTOMY PAIR STRL (DRAPES) ×2 IMPLANT
MANIFOLD NEPTUNE II (INSTRUMENTS) ×2 IMPLANT
MARKER SKIN DUAL TIP RULER LAB (MISCELLANEOUS) ×2 IMPLANT
NDL SAFETY ECLIPSE 18X1.5 (NEEDLE) ×1 IMPLANT
NEEDLE HYPO 18GX1.5 SHARP (NEEDLE) ×1
PACK TOTAL KNEE CUSTOM (KITS) ×2 IMPLANT
SUT MNCRL AB 4-0 PS2 18 (SUTURE) ×2 IMPLANT
SUT STRATAFIX PDS+ 0 24IN (SUTURE) ×2 IMPLANT
SUT VIC AB 1 CT1 36 (SUTURE) ×2 IMPLANT
SUT VIC AB 2-0 CT1 27 (SUTURE) ×2
SUT VIC AB 2-0 CT1 TAPERPNT 27 (SUTURE) ×2 IMPLANT
SYR 3ML LL SCALE MARK (SYRINGE) IMPLANT
SYR 50ML LL SCALE MARK (SYRINGE) ×2 IMPLANT
SYSTEM KNEE OXFORD PARTIAL (Knees) ×2 IMPLANT
TRAY FOLEY BAG SILVER LF 14FR (CATHETERS) ×2 IMPLANT
TRAY FOLEY MTR SLVR 16FR STAT (SET/KITS/TRAYS/PACK) IMPLANT
WATER STERILE IRR 1000ML UROMA (IV SOLUTION) ×4 IMPLANT
WRAP KNEE MAXI GEL POST OP (GAUZE/BANDAGES/DRESSINGS) ×2 IMPLANT
YANKAUER SUCT BULB TIP NO VENT (SUCTIONS) ×2 IMPLANT

## 2017-10-30 NOTE — Anesthesia Postprocedure Evaluation (Signed)
Anesthesia Post Note  Patient: Wanda Oneill  Procedure(s) Performed: UNICOMPARTMENTAL RIGHT KNEE LATERAL (Right Knee)     Patient location during evaluation: PACU Anesthesia Type: Spinal Level of consciousness: awake and alert Pain management: pain level controlled Vital Signs Assessment: post-procedure vital signs reviewed and stable Respiratory status: spontaneous breathing and respiratory function stable Cardiovascular status: blood pressure returned to baseline and stable Postop Assessment: spinal receding Anesthetic complications: no                  Neville Walston DANIEL

## 2017-10-30 NOTE — Evaluation (Addendum)
Physical Therapy Evaluation Patient Details Name: Wanda Oneill MRN: [id number] DOB: 11/08/1980 Today's Date: 10/30/2017   History of Present Illness  Pt is a 37 YO female s/p R UKR on 10/30/17. PMH includes down syndrome, DMII, GERD, headaches, IBS, RLS, multiple hernia repairs.   Clinical Impression   Pt presents with R knee pain exacerbated by mobility, difficulty performing mobility tasks, mild bilat LE weakness, and decreased tolerance for ambulation due to pain. Pt to benefit from acute PT to address deficits. Pt ambulated 20 ft with RW with min guard assist, requiring 3 seated rest breaks because of R knee pain. Pt would prefer to be d/c home with family with OPPT, but family states if she is not safe to go home they are open to ST-SNF stay. Family states pt will need to be able to ambulate 45 ft at once to safely return home. Continue to assess d/c placement tomorrow. Will continue to follow acutely, and will progress mobility as tolerated by pt.     Follow Up Recommendations Follow surgeon's recommendation for DC plan and follow-up therapies;Supervision for mobility/OOB(Preferred home, option for ST-SNF for rehab )    Equipment Recommendations  None recommended by PT    Recommendations for Other Services       Precautions / Restrictions Precautions Precautions: Fall;Knee Restrictions Weight Bearing Restrictions: No Other Position/Activity Restrictions: WBAT       Mobility  Bed Mobility Overal bed mobility: Needs Assistance Bed Mobility: Supine to Sit     Supine to sit: Min assist;HOB elevated     General bed mobility comments: Assist for LE management, trunk elevation. Verbal cuing to scoot to EOB. Pt with painful R knee when sitting EOB due to dependent position of limb.   Transfers Overall transfer level: Needs assistance Equipment used: Rolling walker (2 wheeled) Transfers: Sit to/from Stand Sit to Stand: Min assist;+2 safety/equipment;+2 physical  assistance;From elevated surface         General transfer comment: Assist for power up and steadying upon standing. No RLE buckling present with weight shifts. Verbal cuing for hand placement.   Ambulation/Gait Ambulation/Gait assistance: +2 safety/equipment;Min guard(chair follow ) Gait Distance (Feet): 20 Feet Assistive device: Rolling walker (2 wheeled) Gait Pattern/deviations: Step-to pattern;Antalgic;Decreased weight shift to right;Decreased stance time - right;Trunk flexed;Decreased stride length;Wide base of support Gait velocity: very decr    General Gait Details: Min guard for safety. Verbal cuing for sequencing, placement in RW. Pt required 3 seated rest breaks due to R knee pain, which increased with ambulation.   Stairs            Wheelchair Mobility    Modified Rankin (Stroke Patients Only)       Balance Overall balance assessment: Mild deficits observed, not formally tested                                           Pertinent Vitals/Pain Pain Assessment: 0-10 Pain Score: 7  Pain Location: R knee  Pain Descriptors / Indicators: Constant;Sore;Tightness Pain Intervention(s): Limited activity within patient's tolerance;Repositioned;Ice applied;Monitored during session    [place] expects to be discharged to:: Private residence Living Arrangements: Parent(for 2-3 weeks or as needed ) Available Help at Discharge: Family;Available PRN/intermittently Type of Home: House Home Access: Stairs to enter Entrance Stairs-Rails: Right Entrance Stairs-Number of Steps: 2 Home Layout: Two level;Able to live on main level with bedroom/bathroom  Home Equipment: Toilet riser;Walker - 2 wheels;Cane - single point      Prior Function Level of Independence: Independent               Hand Dominance   Dominant Hand: Right    Extremity/Trunk Assessment   Upper Extremity Assessment Upper Extremity Assessment: Overall WFL for  tasks assessed    Lower Extremity Assessment Lower Extremity Assessment: Generalized weakness;RLE deficits/detail RLE Deficits / Details: Suspected post-surgical RLE weakness; weak quad set, difficulty with SLR  RLE Sensation: WNL    Cervical / Trunk Assessment Cervical / Trunk Assessment: Normal  Communication   Communication: No difficulties  Cognition Arousal/Alertness: Awake/alert Behavior During Therapy: WFL for tasks assessed/performed Overall Cognitive Status: Within Functional Limits for tasks assessed                                        General Comments      Exercises     Assessment/Plan    PT Assessment Patient needs continued PT services  PT Problem List Decreased strength;Pain;Decreased range of motion;Decreased activity tolerance;Decreased knowledge of use of DME;Decreased balance;Decreased safety awareness;Decreased mobility       PT Treatment Interventions DME instruction;Therapeutic activities;Therapeutic exercise;Gait training;Patient/family education;Stair training;Balance training;Functional mobility training    PT Goals (Current goals can be found in the Care Plan section)  Acute Rehab PT Goals Patient Stated Goal: go home with parents  PT Goal Formulation: With patient/family Time For Goal Achievement: 11/06/17 Potential to Achieve Goals: Good    Frequency 7X/week   Barriers to discharge        Co-evaluation               AM-PAC PT "6 Clicks" Daily Activity  Outcome Measure Difficulty turning over in bed (including adjusting bedclothes, sheets and blankets)?: Unable Difficulty moving from lying on back to sitting on the side of the bed? : Unable Difficulty sitting down on and standing up from a chair with arms (e.g., wheelchair, bedside commode, etc,.)?: Unable Help needed moving to and from a bed to chair (including a wheelchair)?: A Little Help needed walking in hospital room?: A Little Help needed climbing 3-5  steps with a railing? : A Little 6 Click Score: 12    End of Session Equipment Utilized During Treatment: Gait belt Activity Tolerance: Patient tolerated treatment well;Patient limited by pain Patient left: in chair;with chair alarm set;with call bell/phone within reach;with family/visitor present;with SCD's reapplied Nurse Communication: Mobility status PT Visit Diagnosis: Difficulty in walking, not elsewhere classified (R26.2);Other abnormalities of gait and mobility (R26.89)    Time: 7893-8101 PT Time Calculation (min) (ACUTE ONLY): 32 min   Charges:   PT Evaluation $PT Eval Low Complexity: 1 Low PT Treatments $Gait Training: 8-22 mins        Julien Girt, PT Acute Rehabilitation Services Pager [phone]  Office [phone]   Ruthe Roemer D Audriella Blakeley 10/30/2017, 5:30 PM

## 2017-10-30 NOTE — Op Note (Signed)
NAME: Rosier, INAAYA VELLUCCI MEDICAL RECORD [id number] ACCOUNT [id number] DATE OF BIRTH:03-31-1980 FACILITY: [facility] LOCATION: [facility] PHYSICIAN:Lorie Melichar Marian Sorrow, MD  OPERATIVE REPORT  DATE OF PROCEDURE:  10/30/2017  PREOPERATIVE DIAGNOSIS:  Right knee lateral compartment isolated osteoarthritis.  POSTOPERATIVE DIAGNOSIS:  Right knee lateral compartment isolated osteoarthritis.  PROCEDURE:  Right knee lateral compartment unicompartmental arthroplasty.  COMPONENTS USED:  A Biomet lateral fixed bearing arthroplasty with a size extra small femur and a right size C3 lateral tibial complex.  SURGEON:  Paralee Cancel, MD  ASSISTANT:  Danae Orleans PA-C.  Note that Ms. Babish was present for the entirety of the case and preoperative positioning, perioperative management of the operative extremity, general facilitation of the case and primary wound closure.  ANESTHESIA:  Regional plus spinal.  SPECIMENS:  None.  COMPLICATIONS:  None.  ESTIMATED BLOOD LOSS:  Less than 100 mL.  TOURNIQUET:  Set at 250 mmHg for 42 minutes.  DRAINS:  None.  INDICATIONS:  This lady is a very pleasant 37 year old female with Down syndrome who was referred for evaluation and management of an isolated lateral compartment osteoarthritis failing conservative measures including arthroscopic surgery and  medications.  We discussed treatment options of partial versus total knee arthroplasty in the setting of isolated lateral compartment disease.  We discussed the risks of infection and DVT.  The risk of progression of degenerative changes in the other  compartments.  Failure of the implants that would require conversion to total knee arthroplasty.  After discussion with her family, the decision was to proceed with right lateral arthroplasty.  Consent was obtained for benefit of pain relief.  DESCRIPTION OF PROCEDURE:  The patient was brought to the operative theater.  Once adequate anesthesia, preoperative antibiotic  administered including clindamycin and gentamicin.  She was positioned supine with a right thigh tourniquet placed.  Right  lower extremity was then positioned over the Oxford leg holder to allow for 120 degrees of flexion.  Right lower extremity was then prepped and draped in sterile fashion.  A timeout was performed identifying the patient, the planned procedure and  extremity.  Leg was exsanguinated, tourniquet elevated to 250 mmHg.  A lateral paramidline incision was made from the proximal pole of patella to the tubercle.  Soft tissue exposure obtained.  Lateral arthrotomy was made with minimal dissection of the  proximal lateral tibia.  Once the knee was exposed and synovectomy carried out, I first sized the femur to be a size extra small accounting for the defect and the loss of cartilage.  Once this was carried out, we placed extramedullary guide parallel to  the tibial crest.  I made a split through the patella tendon and then used a reciprocating saw along the lateral ridge of the anterior spine of the tibia and then used an oscillating saw to resect bone off the proximal tibia to make a minimal resection  per protocol design.  We then brought the knee to full extension with a size C tibial tray in place.  In extension, I found that the size 3 feeler gauge had appropriate tension at full extension.  Once we did this, I attended to the femoral preparation.  The femoral canal was opened with a drill and opened with a T handle awl and an intramedullary rod passed.  The extra small posterior cutting block guide was then positioned on the proximal cut  surface of the tibia in length to the intramedullary rod.  Drill holes were made in the middle portion of the lateral  femoral condyle.  The posterior cutting block was then tamped to the distal femur and the posterior cut of the femur, made.  At this  point, per protocol, I placed a 0 speculum in the distal femur and milled the distal femur, removing any  bone that was a necessary.  We then did a trial reduction with an extra small femur the C tibial tray.  In this position, I could not get even the #1  alert in place in extension.  For this reason, I started with a 4 spigot.  I placed the 4 spigot on the milled distal femur, removing excessive bone.  Once this was done, we retrialed again with a C tibia, and an extra small femur.  Here, I was able to get the knee to extension, but  only with a 2 insert.  Given these findings, I did re-mill with a 5.  Note that the flexion gap was appropriately lax with the above noted findings.  Once I milled again with a size 5 spigot, we removed excessive bone.  I then fit final tibial preparation, pinning the tray into place and using a reciprocating saw to create  a keel.  At this point, we did a final trial reduction with an extra small femur.  The right lateral C3 construct.  Here I found the knee was stable in extension and flexion.  She was noted based on her Down syndrome to have 10 degrees of passive  hyperextension and this was maintained.  Given these findings, we removed all the trial components.  I drilled the sclerotic bone.  We mixed cement and the final components were opened.  The final components were then cemented into place, cementing the femur first removing excessive cement and  then in a figure-of-four position, placed in the size right C3 lateral tibial complex.  It was then tamped into place and brought into full extension for compression.  Excess cement was removed.  Once the cement fully cured, we checked to make certain  that there was no visualized cement and none was seen and if it was, it was removed.  We then irrigated the knee.  The tourniquet was let down after 40 minutes.  The knee was irrigated.  The extensor mechanism was reapproximated using #1 Vicryl and #1  Stratafix suture.  The remaining wound was closed with 2-0 Vicryl and running Monocryl stitch.  The knee was then cleaned,  dried and dressed sterilely using surgical glue and Aquacel dressing.  The knee was wrapped in Ace wrap and ice pack.  Note that we  did do an intraarticular synovial injection of Marcaine, Toradol and saline.  She tolerated the procedure well.  Findings were reviewed with family.  She will be admitted to the hospital overnight for observation.  TN/NUANCE  D:10/30/2017 T:10/30/2017 JOB:[id number]

## 2017-10-30 NOTE — Progress Notes (Signed)
Assisted Dr. Singer with right, ultrasound guided, adductor canal block. Side rails up, monitors on throughout procedure. See vital signs in flow sheet. Tolerated Procedure well.  

## 2017-10-30 NOTE — Anesthesia Procedure Notes (Signed)
Spinal  Patient location during procedure: OR Start time: 10/30/2017 8:47 AM End time: 10/30/2017 8:57 AM Staffing Anesthesiologist: Duane Boston, MD Performed: anesthesiologist  Preanesthetic Checklist Completed: patient identified, surgical consent, pre-op evaluation, timeout performed, IV checked, risks and benefits discussed and monitors and equipment checked Spinal Block Patient position: sitting Prep: DuraPrep Patient monitoring: cardiac monitor, continuous pulse ox and blood pressure Approach: midline Location: L3-4 Injection technique: single-shot Needle Needle type: Pencan  Needle gauge: 24 G Needle length: 9 cm Additional Notes Functioning IV was confirmed and monitors were applied. Sterile prep and drape, including hand hygiene and sterile gloves were used. The patient was positioned and the spine was prepped. The skin was anesthetized with lidocaine.  Free flow of clear CSF was obtained prior to injecting local anesthetic into the CSF.  The spinal needle aspirated freely following injection.  The needle was carefully withdrawn.  The patient tolerated the procedure well. Difficult placement.

## 2017-10-30 NOTE — Anesthesia Procedure Notes (Signed)
Anesthesia Regional Block: Adductor canal block   Pre-Anesthetic Checklist: ,, timeout performed, Correct Patient, Correct Site, Correct Laterality, Correct Procedure, Correct Position, site marked, Risks and benefits discussed,  Surgical consent,  Pre-op evaluation,  At surgeon's request and post-op pain management  Laterality: Right  Prep: chloraprep       Needles:  Injection technique: Single-shot  Needle Type: Stimulator Needle - 80     Needle Length: 10cm  Needle Gauge: 21     Additional Needles:   Narrative:  Start time: 10/30/2017 8:11 AM End time: 10/30/2017 8:21 AM Injection made incrementally with aspirations every 5 mL.  Performed by: Personally

## 2017-10-30 NOTE — Brief Op Note (Signed)
10/30/2017  10:33 AM  PATIENT:  Tresa Endo  37 y.o. female  PRE-OPERATIVE DIAGNOSIS:  Right knee lateral compartment osetoarthritis  POST-OPERATIVE DIAGNOSIS:  Right knee lateral compartment osetoarthritis  PROCEDURE:  Procedure(s) with comments: UNICOMPARTMENTAL RIGHT KNEE LATERAL (Right) - 90 mins  SURGEON:  Surgeon(s) and Role:    Paralee Cancel, MD - Primary  PHYSICIAN ASSISTANT: Danae Orleans, PA-C  ANESTHESIA:   regional and spinal  EBL:  50 mL   BLOOD ADMINISTERED:none  DRAINS: none   LOCAL MEDICATIONS USED:  MARCAINE     SPECIMEN:  No Specimen  DISPOSITION OF SPECIMEN:  N/A  COUNTS:  YES  TOURNIQUET:   Total Tourniquet Time Documented: Thigh (Right) - 42 minutes Total: Thigh (Right) - 42 minutes   DICTATION: .Other Dictation: Dictation Number [id number]  PLAN OF CARE: Admit for overnight observation  PATIENT DISPOSITION:  PACU - hemodynamically stable.   Delay start of Pharmacological VTE agent (>24hrs) due to surgical blood loss or risk of bleeding: no

## 2017-10-30 NOTE — Anesthesia Preprocedure Evaluation (Addendum)
Anesthesia Evaluation  Patient identified by MRN, date of birth, ID band Patient awake    Reviewed: Allergy & Precautions, NPO status , Patient's Chart, lab work & pertinent test results  History of Anesthesia Complications Negative for: history of anesthetic complications  Airway Mallampati: III  TM Distance: >3 FB Neck ROM: Full    Dental no notable dental hx. (+) Dental Advisory Given   Pulmonary    Pulmonary exam normal        Cardiovascular negative cardio ROS Normal cardiovascular exam     Neuro/Psych  Headaches, negative psych ROS   GI/Hepatic Neg liver ROS, GERD  ,  Endo/Other  diabetesHypothyroidism   Renal/GU negative Renal ROS     Musculoskeletal   Abdominal   Peds  Hematology   Anesthesia Other Findings   Reproductive/Obstetrics                            Anesthesia Physical Anesthesia Plan  ASA: III  Anesthesia Plan: Spinal   Post-op Pain Management:  Regional for Post-op pain   Induction:   PONV Risk Score and Plan: 2 and Ondansetron and Propofol infusion  Airway Management Planned: Natural Airway and Simple Face Mask  Additional Equipment:   Intra-op Plan:   Post-operative Plan:   Informed Consent: I have reviewed the patients History and Physical, chart, labs and discussed the procedure including the risks, benefits and alternatives for the proposed anesthesia with the patient or authorized representative who has indicated his/her understanding and acceptance.   Dental advisory given  Plan Discussed with: Anesthesiologist and CRNA  Anesthesia Plan Comments:        Anesthesia Quick Evaluation

## 2017-10-30 NOTE — Discharge Instructions (Signed)

## 2017-10-30 NOTE — Transfer of Care (Signed)
Immediate Anesthesia Transfer of Care Note  Patient: Wanda Oneill  Procedure(s) Performed: UNICOMPARTMENTAL RIGHT KNEE LATERAL (Right Knee)  Patient Location: PACU  Anesthesia Type:Regional and Spinal  Level of Consciousness: sedated  Airway & Oxygen Therapy: Patient Spontanous Breathing and Patient connected to face mask oxygen  Post-op Assessment: Report given to RN and Post -op Vital signs reviewed and stable  Post vital signs: Reviewed and stable  Last Vitals:  Vitals Value Taken Time  BP    Temp    Pulse    Resp    SpO2      Last Pain:  Vitals:   10/30/17 0649  TempSrc: Oral      Patients Stated Pain Goal: 3 (11/55/20 8022)  Complications: No apparent anesthesia complications

## 2017-10-30 NOTE — Interval H&P Note (Signed)
History and Physical Interval Note:  10/30/2017 7:19 AM  Wanda Oneill  has presented today for surgery, with the diagnosis of Right knee lateral osteoarthritis  The various methods of treatment have been discussed with the patient and family. After consideration of risks, benefits and other options for treatment, the patient has consented to  Procedure(s) with comments: UNICOMPARTMENTAL RIGHT KNEE LATERAL (Right) - 90 mins as a surgical intervention .  The patient's history has been reviewed, patient examined, no change in status, stable for surgery.  I have reviewed the patient's chart and labs.  Questions were answered to the patient's satisfaction.     Mauri Pole

## 2017-10-31 ENCOUNTER — Encounter (HOSPITAL_COMMUNITY): Payer: Self-pay | Admitting: Orthopedic Surgery

## 2017-10-31 DIAGNOSIS — M1711 Unilateral primary osteoarthritis, right knee: Secondary | ICD-10-CM | POA: Diagnosis not present

## 2017-10-31 LAB — GLUCOSE, CAPILLARY
GLUCOSE-CAPILLARY: 175 mg/dL — AB (ref 70–99)
GLUCOSE-CAPILLARY: 147 mg/dL — AB (ref 70–99)
Glucose-Capillary: 160 mg/dL — ABNORMAL HIGH (ref 70–99)
Glucose-Capillary: 200 mg/dL — ABNORMAL HIGH (ref 70–99)

## 2017-10-31 LAB — CBC
HCT: 39 % (ref 36.0–46.0)
HEMOGLOBIN: 12.5 g/dL (ref 12.0–15.0)
MCH: 29.4 pg (ref 26.0–34.0)
MCHC: 32.1 g/dL (ref 30.0–36.0)
MCV: 91.8 fL (ref 78.0–100.0)
Platelets: 252 10*3/uL (ref 150–400)
RBC: 4.25 MIL/uL (ref 3.87–5.11)
RDW: 15.6 % — AB (ref 11.5–15.5)
WBC: 12.3 10*3/uL — AB (ref 4.0–10.5)

## 2017-10-31 LAB — BASIC METABOLIC PANEL
ANION GAP: 13 (ref 5–15)
BUN: 9 mg/dL (ref 6–20)
CALCIUM: 8 mg/dL — AB (ref 8.9–10.3)
CO2: 20 mmol/L — ABNORMAL LOW (ref 22–32)
CREATININE: 0.71 mg/dL (ref 0.44–1.00)
Chloride: 102 mmol/L (ref 98–111)
GFR calc non Af Amer: >60 mL/min (ref >60)
Glucose, Bld: 185 mg/dL — ABNORMAL HIGH (ref 70–99)
Potassium: 4.2 mmol/L (ref 3.5–5.1)
SODIUM: 135 mmol/L (ref 135–145)

## 2017-10-31 MED ORDER — DOCUSATE SODIUM 100 MG PO CAPS: 100.0000 mg | ORAL_CAPSULE | Freq: Two times a day (BID) | ORAL | 0 refills | Status: DC

## 2017-10-31 MED ORDER — POLYETHYLENE GLYCOL 3350 17 G PO PACK: 17.0000 g | PACK | Freq: Two times a day (BID) | ORAL | 0 refills | Status: DC

## 2017-10-31 MED ORDER — METHOCARBAMOL 500 MG PO TABS: 500.0000 mg | ORAL_TABLET | Freq: Four times a day (QID) | ORAL | 0 refills | Status: DC | PRN

## 2017-10-31 MED ORDER — DICYCLOMINE HCL 20 MG PO TABS
40.0000 mg | ORAL_TABLET | Freq: Four times a day (QID) | ORAL | Status: DC | PRN
  Administered 2017-10-31: 40 mg via ORAL
  Filled 2017-10-31 (×2): qty 2

## 2017-10-31 MED ORDER — HYDROCODONE-ACETAMINOPHEN 7.5-325 MG PO TABS: 1.0000 | ORAL_TABLET | ORAL | 0 refills | Status: DC | PRN

## 2017-10-31 MED ORDER — ASPIRIN 81 MG PO CHEW: 81.0000 mg | CHEWABLE_TABLET | Freq: Two times a day (BID) | ORAL | 0 refills | Status: AC

## 2017-10-31 MED ORDER — FERROUS SULFATE 325 (65 FE) MG PO TABS: 325.0000 mg | ORAL_TABLET | Freq: Three times a day (TID) | ORAL | 3 refills | Status: DC

## 2017-10-31 NOTE — Progress Notes (Signed)
Physical Therapy Treatment Patient Details Name: Wanda Oneill MRN: [id number] DOB: 1980-04-15 Today's Date: 10/31/2017    History of Present Illness Pt is a 37 YO female s/p R UKR on 10/30/17. PMH includes down syndrome, DMII, GERD, headaches, IBS, RLS, multiple hernia repairs.     PT Comments    Right KI placed with much improved weight bearing and able to progress ambulation. Will assist back into bed after use of BSC. Plans Rehab.  Follow Up Recommendations  SNF     Equipment Recommendations  None recommended by PT    Recommendations for Other Services       Precautions / Restrictions Precautions Precautions: Fall;Knee Precaution Comments: used for PT ambulation Required Braces or Orthoses: Knee Immobilizer - Right    Mobility  Bed Mobility                  Transfers Overall transfer level: Needs assistance Equipment used: Rolling walker (2 wheeled) Transfers: Sit to/from [employer] Transfers Sit to Stand: Mod assist         General transfer comment: Assist for power up and steadying upon standing.  Multiple attempts from recliner with more assistance. Cues for hand  and right leg position when sitting down.   Ambulation/Gait Ambulation/Gait assistance: +2 safety/equipment;Mod assist;Min assist Gait Distance (Feet): 15 Feet(then 20') Assistive device: Rolling walker (2 wheeled) Gait Pattern/deviations: Step-to pattern;Step-through pattern;Decreased step length - left;Decreased stance time - right     General Gait Details: much improved step length on left using right KI. Patient able to progress ambulation.   Stairs             Wheelchair Mobility    Modified Rankin (Stroke Patients Only)       Balance                                            Cognition Arousal/Alertness: Awake/alert                                            Exercises      General Comments        Pertinent  Vitals/Pain Pain Score: 5  Pain Location: R knee  Pain Descriptors / Indicators: Constant;Sore;Tightness Pain Intervention(s): Monitored during session;Premedicated before session;Ice applied    Home Living                      Prior Function            PT Goals (current goals can now be found in the care plan section) Progress towards PT goals: Progressing toward goals    Frequency    7X/week      PT Plan Current plan remains appropriate    Co-evaluation              AM-PAC PT "6 Clicks" Daily Activity  Outcome Measure  Difficulty turning over in bed (including adjusting bedclothes, sheets and blankets)?: A Lot Difficulty moving from lying on back to sitting on the side of the bed? : A Lot Difficulty sitting down on and standing up from a chair with arms (e.g., wheelchair, bedside commode, etc,.)?: A Lot Help needed moving to and from a bed to chair (including a wheelchair)?: A Lot Help  needed walking in hospital room?: A Lot Help needed climbing 3-5 steps with a railing? : Total 6 Click Score: 11    End of Session Equipment Utilized During Treatment: Gait belt Activity Tolerance: Patient tolerated treatment well Patient left: (on [facility]) Nurse Communication: Mobility status PT Visit Diagnosis: Difficulty in walking, not elsewhere classified (R26.2);Other abnormalities of gait and mobility (R26.89)     Time: 1413-1500 PT Time Calculation (min) (ACUTE ONLY): 47 min  Charges:  $Gait Training: 23-37 mins $Self Care/Home Management: [place] Pager [phone] Office [phone]    Claretha Cooper 10/31/2017, 3:41 PM

## 2017-10-31 NOTE — Progress Notes (Signed)
     Subjective: 1 Day Post-Op Procedure(s) (LRB): UNICOMPARTMENTAL RIGHT KNEE LATERAL (Right)   Patient reports pain as mild, controlled with medications. No events throughout the night.  Family concerned about safety at home with regards to ambulation.  Plan for discharge tomorrow due to underlying medical co-morbidities, pain control and need for inpatient therapy to meet goal of being discharged home safely with family/caregiver.  Past Medical History:  Diagnosis Date  . Diabetes mellitus    type II  . Down syndrome   . Family history of adverse reaction to anesthesia    mom has had n/v  . Gastritis   . GERD (gastroesophageal reflux disease)   . Headache   . Hepatic steatosis   . Hidradenitis   . Hypothyroidism   . Irritable bowel syndrome   . Periumbilical hernia   . Pneumonia   . Restless legs   . Tendonitis    chronic in left foot  . Thyroid disease    hypothyroidism      Objective:   VITALS:   Vitals:   10/31/17 0148 10/31/17 0406  BP: (!) 155/85 135/75  Pulse: 98 90  Resp: 18 18  Temp: 98.4 F (36.9 C) 97.7 F (36.5 C)  SpO2: 95% 95%    Dorsiflexion/Plantar flexion intact Incision: dressing C/D/I No cellulitis present Compartment soft  LABS Recent Labs    10/31/17 0447  HGB 12.5  HCT 39.0  WBC 12.3*  PLT 252    Recent Labs    10/31/17 0447  NA 135  K 4.2  BUN 9  CREATININE 0.71  GLUCOSE 185*     Assessment/Plan: 1 Day Post-Op Procedure(s) (LRB): UNICOMPARTMENTAL RIGHT KNEE LATERAL (Right) Foley cath d/c'ed Advance diet Up with therapy D/C IV fluids Discharge disposition TBD  Morbid Obesity (BMI >40)  Estimated body mass index is 40.72 kg/m as calculated from the following:   Height as of this encounter: 5\' 4"  (1.626 m).   Weight as of this encounter: 107.6 kg. Patient also counseled that weight may inhibit the healing process Patient counseled that losing weight will help with future health issues     [place].  Nethra Mehlberg   PAC  10/31/2017, 8:05 AM

## 2017-10-31 NOTE — Clinical Social Work Note (Signed)
Clinical Social Work Assessment  Patient Details  Name: Wanda Oneill MRN: [id number] Date of Birth: 12-01-1980  Date of referral:  10/31/17               Reason for consult:  Discharge Planning                Permission sought to share information with:  Family Supports, Facility Sport and exercise psychologist Permission granted to share information::  Yes, Verbal Permission Granted  Name::     Wanda Oneill, Wanda Oneill Dr  Agency::  SNF  Relationship::  Parents   Contact Information:  [phone], [phone]  Housing/Transportation Living arrangements for the past 2 months:  Apartment Source of Information:  Parent Patient Interpreter Needed:  None Criminal Activity/Legal Involvement Pertinent to Current Situation/Hospitalization:  No - Comment as needed Significant Relationships:  None Lives with:  Self Do you feel safe going back to the place where you live?  Yes Need for family participation in patient care:  Yes  Care giving concerns:  Patient is being admitted for right lateral unicompartmental knee arthroplasty. Patient will need SNF placement for short rehab.   Social Worker assessment / plan: CSW discussed discharge plans with the patient and her parents.Patient parents are agreeable to SNF placement for a couple of days of rehab. Patient plan is to transition home with her parents until she is able to complete her own ADL'S again.  CSW explain SNF process and provided bed offers. Patient and parents agreeable to [facility].  CSW initiated [employer] authorization. FL2 done. PASRR done.   Plan:SNF   Employment status:   Disabled  Insurance information:  Medicare PT Recommendations:  [place] / Referral to community resources:  [place]  Patient/Family's Response to care:  Agreeable and Responding well to care.   Patient/Family's Understanding of and Emotional Response to Diagnosis, Current Treatment, and  Prognosis:  Patient defers to her parents for understanding and support. Patient parents are very acknowledgeable of the patient care.   Emotional Assessment Appearance:  Developmentally appropriate Attitude/Demeanor/Rapport:    Affect (typically observed):  Accepting Orientation:  Oriented to Self, Oriented to Place, Oriented to  Time, Oriented to Situation Alcohol / Substance use:  Not Applicable Psych involvement (Current and /or in the community):  No (Comment)  Discharge Needs  Concerns to be addressed:  Discharge Planning Concerns Readmission within the last 30 days:  No Current discharge risk:  Dependent with Mobility, Physical Impairment Barriers to Discharge:  Continued Medical Work up, Ernest, LCSW 10/31/2017, 11:56 AM

## 2017-10-31 NOTE — Progress Notes (Signed)
Physical Therapy Treatment Patient Details Name: Wanda Oneill MRN: [id number] DOB: 1981-01-08 Today's Date: 10/31/2017    History of Present Illness Pt is a 37 YO female s/p R UKR on 10/30/17. PMH includes down syndrome, DMII, GERD, headaches, IBS, RLS, multiple hernia repairs.     PT Comments    The patient requires extensive time and mod assist of 2 for safe ambulation x 8' x 2. Patient demonstrates decreased ability to advance left leg with decreased tolerance to WB on right leg. Will place KI on right next PT visit in hopes to stabilize right knee to allow increased weight bearing and be able to advance ambulation.  Patient complained of dizziness when upright, BP 143/84. Mildly clammy. Parents present. Patient will benefit from post acute rehab to return to  Prior level of function.  Follow Up Recommendations  SNF     Equipment Recommendations  None recommended by PT    Recommendations for Other Services       Precautions / Restrictions Precautions Precautions: Fall;Knee Precaution Comments: will use KI temporarily Restrictions Weight Bearing Restrictions: No    Mobility  Bed Mobility   Bed Mobility: Supine to Sit     Supine to sit: Mod assist     General bed mobility comments: multimodal cues for  technique, use of bed rail. Assist  for right leg and trunk. Extra time  Transfers Overall transfer level: Needs assistance Equipment used: Rolling walker (2 wheeled) Transfers: Sit to/from Stand Sit to Stand: Mod assist         General transfer comment: Assist for power up and steadying upon standing. from bed raised and recliner with more assistance. Cues for hand  and right leg position when sitting down.   Ambulation/Gait Ambulation/Gait assistance: +2 safety/equipment;Min guard;Mod assist Gait Distance (Feet): 8 Feet(x 2) Assistive device: Rolling walker (2 wheeled) Gait Pattern/deviations: Step-to pattern;Antalgic;Trunk flexed;Decreased stance time -  right;Decreased step length - left Gait velocity: very decr    General Gait Details: decreased  tolerance and support of the right leg in order  to step forward with the left leg. Much extra time required  to advance left leg. Multimodal cues for posture, tends to look down and lean on RW at forearms. Seated rest and  stood with mod assist from recliner, asssitance to power up and steady. Again much difficulty advancing left leg. Left leg tending to be in external rotation.   Stairs             Wheelchair Mobility    Modified Rankin (Stroke Patients Only)       Balance Overall balance assessment: Needs assistance Sitting-balance support: Feet supported;No upper extremity supported Sitting balance-Leahy Scale: Fair     Standing balance support: During functional activity;Bilateral upper extremity supported Standing balance-Leahy Scale: Poor Standing balance comment: relies on UE for suport                            Cognition Arousal/Alertness: Awake/alert Behavior During Therapy: WFL for tasks assessed/performed Overall Cognitive Status: Within Functional Limits for tasks assessed                                        Exercises Total Joint Exercises Ankle Circles/Pumps: AROM;Both;10 reps;Supine Quad Sets: AAROM;Both;10 reps;Supine Heel Slides: AAROM;Right;10 reps;Supine Hip ABduction/ADduction: AAROM;Right;10 reps Straight Leg Raises: AAROM;Right;10 reps Goniometric ROM: 5-  45 degrees knee flexion while sitting. Patient guarding during heel slides Patient guarding with attempts for SLR and knee flexion.   General Comments        Pertinent Vitals/Pain Pain Score: 7  Pain Location: R knee  Pain Descriptors / Indicators: Constant;Sore;Tightness Pain Intervention(s): Monitored during session;Limited activity within patient's tolerance;Premedicated before session;Ice applied    Home Living                      Prior Function             PT Goals (current goals can now be found in the care plan section) Progress towards PT goals: Progressing toward goals    Frequency    7X/week      PT Plan Discharge plan needs to be updated    Co-evaluation              AM-PAC PT "6 Clicks" Daily Activity  Outcome Measure  Difficulty turning over in bed (including adjusting bedclothes, sheets and blankets)?: Unable Difficulty moving from lying on back to sitting on the side of the bed? : Unable Difficulty sitting down on and standing up from a chair with arms (e.g., wheelchair, bedside commode, etc,.)?: Unable Help needed moving to and from a bed to chair (including a wheelchair)?: Total Help needed walking in hospital room?: Total Help needed climbing 3-5 steps with a railing? : Total 6 Click Score: 6    End of Session Equipment Utilized During Treatment: Gait belt Activity Tolerance: Patient limited by pain Patient left: in chair;with call bell/phone within reach;with chair alarm set Nurse Communication: Mobility status PT Visit Diagnosis: Difficulty in walking, not elsewhere classified (R26.2);Other abnormalities of gait and mobility (R26.89)     Time: 1006-1050 PT Time Calculation (min) (ACUTE ONLY): 44 min  Charges:  $Gait Training: 23-37 mins $Therapeutic Exercise: 8-22 mins                     Tresa Endo PT Acute Rehabilitation Services Pager [phone] Office [phone]    Claretha Cooper 10/31/2017, 11:17 AM

## 2017-10-31 NOTE — Progress Notes (Signed)
Physical Therapy Treatment Patient Details Name: Wanda Oneill MRN: [id number] DOB: [birth year] Today's Date: 10/31/2017    History of Present Illness Pt is a 37 YO female s/p R UKR on 10/30/17. PMH includes down syndrome, DMII, GERD, headaches, IBS, RLS, multiple hernia repairs.     PT Comments    The patient is progressing well. KI is supportive. Continue mobility and wean from [facility] use.   Follow Up Recommendations  SNF     Equipment Recommendations  None recommended by PT    Recommendations for Other Services       Precautions / Restrictions Precautions Precautions: Fall;Knee Precaution Comments: used for PT ambulation Required Braces or Orthoses: Knee Immobilizer - Right    Mobility  Bed Mobility   Bed Mobility: Sit to Supine       Sit to supine: Min assist   General bed mobility comments: assist right leg onto bed. later demonstrated leg lifter to be used for repositioning and bed mobility  Transfers Overall transfer level: Needs assistance Equipment used: Rolling walker (2 wheeled) Transfers: Sit to/from [facility] Sit to Stand: Min assist Stand pivot transfers: Min assist       General transfer comment: min assist from higher surface of BSC. Pivot steps to rbed, able to side step along the bed.  Ambulation/Gait        Stairs             Wheelchair Mobility    Modified Rankin (Stroke Patients Only)       Balance                                            Cognition Arousal/Alertness: Awake/alert                                            Exercises      General Comments        Pertinent Vitals/Pain Pain Score: 5  Pain Location: R knee  Pain Descriptors / Indicators: Sore;Tightness Pain Intervention(s): Premedicated before session;Monitored during session;Limited activity within patient's tolerance;Ice applied    Home Living                      Prior Function             PT Goals (current goals can now be found in the care plan section) Progress towards PT goals: Progressing toward goals    Frequency    7X/week      PT Plan Current plan remains appropriate    Co-evaluation              AM-PAC PT "6 Clicks" Daily Activity  Outcome Measure  Difficulty turning over in bed (including adjusting bedclothes, sheets and blankets)?: A Lot Difficulty moving from lying on back to sitting on the side of the bed? : A Lot Difficulty sitting down on and standing up from a chair with arms (e.g., wheelchair, bedside commode, etc,.)?: A Lot Help needed moving to and from a bed to chair (including a wheelchair)?: A Lot Help needed walking in hospital room?: A Lot Help needed climbing 3-5 steps with a railing? : Total 6 Click Score: 11    End of Session Equipment Utilized During Treatment: Gait belt  Activity Tolerance: Patient tolerated treatment well Patient left: in bed;with call bell/phone within reach;with family/visitor present;with bed alarm set Nurse Communication: Mobility status PT Visit Diagnosis: Difficulty in walking, not elsewhere classified (R26.2);Other abnormalities of gait and mobility (R26.89)     Time: 9810-2548 PT Time Calculation (min) (ACUTE ONLY): 17 min  Charges:  $$Therapeutic Activity: 23-37 mins $                    Claretha Cooper 10/31/2017, 3:46 PM  Tresa Endo PT Acute Rehabilitation Services Pager [phone] Office [phone]

## 2017-10-31 NOTE — NC FL2 (Addendum)
[place] LEVEL OF CARE SCREENING TOOL     IDENTIFICATION  Patient Name: Wanda Oneill Birthdate: 08-Apr-1980 Sex: female Admission Date (Current Location): 10/30/2017  [facility] and [place] Number:  Herbalist and Address:  [facility],  [place] [address], [place]      Provider Number: [id number]  Attending Physician Name and Address:  Paralee Cancel, MD  Relative Name and Phone Number:       Current Level of Care: Hospital Recommended Level of Care: [place] Prior Approval Number:    Date Approved/Denied:   PASRR Number: [id number]  Discharge Plan: SNF    Current Diagnoses: Patient Active Problem List   Diagnosis Date Noted  . Morbid obesity ([place]) 10/31/2017  . S/P right UKR, lateral 10/30/2017  . Incarcerated epigastric hernia 05/30/2015  . Left foot pain 08/09/2014  . Chronic diarrhea 07/27/2014  . Gout 08/10/2012  . Acid reflux 01/22/2012  . Down syndrome 11/11/2011  . Hernia of abdominal wall 10/21/2011  . Psoriasis 10/21/2011  . Type 2 diabetes mellitus, uncontrolled ([place]) 04/12/2011  . Hypothyroid 04/12/2011  . Gait abnormality 06/22/2010  . Tibial tendinitis, posterior 06/22/2010    Orientation RESPIRATION BLADDER Height & Weight     Self, Time, Situation, Place  Normal Continent Weight: 237 lb 3.2 oz (107.6 kg) Height:  5\' 4"  (162.6 cm)  BEHAVIORAL SYMPTOMS/MOOD NEUROLOGICAL BOWEL NUTRITION STATUS      Continent Diet(Low Sodium Heart Healthy )  AMBULATORY STATUS COMMUNICATION OF NEEDS Skin   Extensive Assist Verbally Surgical wounds                       Personal Care Assistance Level of Assistance  Bathing, Feeding, Dressing Bathing Assistance: Limited assistance Feeding assistance: Independent Dressing Assistance: Limited assistance     Functional Limitations Info  Sight, Hearing, Speech Sight Info: Impaired Hearing Info: Adequate Speech Info: Adequate    SPECIAL CARE  FACTORS FREQUENCY  PT (By licensed PT), OT (By licensed OT)     PT Frequency: 5X/WEEK  OT Frequency: 5X/WEEK             Contractures Contractures Info: Not present    Additional Factors Info  Code Status, Allergies, Psychotropic, Insulin Sliding Scale Code Status Info: Fullcode  Allergies Info: Allergies: Vancomycin, Cefuroxime, Cefuroxime Axetil, Sulfa Antibiotics Psychotropic Info: Tramadol  Insulin Sliding Scale Info: See Med list        Current Medications (10/31/2017):  This is the current hospital active medication list Current Facility-Administered Medications  Medication Dose Route Frequency Provider Last Rate Last Dose  . 0.9 %  sodium chloride infusion   Intravenous Continuous Danae Orleans, PA-C 75 mL/hr at 10/31/17 0310    . acetaminophen (TYLENOL) tablet 325-650 mg  325-650 mg Oral Q6H PRN Danae Orleans, PA-C   650 mg at 10/31/17 0647  . allopurinol (ZYLOPRIM) tablet 300 mg  300 mg Oral Daily Danae Orleans, PA-C   300 mg at 10/31/17 1104  . alum & mag hydroxide-simeth (MAALOX/MYLANTA) 200-200-20 MG/5ML suspension 15 mL  15 mL Oral Q4H PRN Danae Orleans, PA-C      . aspirin chewable tablet 81 mg  81 mg Oral BID Danae Orleans, PA-C   81 mg at 10/31/17 0849  . bisacodyl (DULCOLAX) suppository 10 mg  10 mg Rectal Daily PRN Babish, Rodman Key, PA-C      . canagliflozin [facility]) tablet 100 mg  100 mg Oral QAC breakfast Danae Orleans, PA-C  100 mg at 10/31/17 0849  . cholestyramine light (PREVALITE) packet 4 g  4 g Oral Q24H Paralee Cancel, MD   4 g at 10/31/17 [phone]  . dicyclomine (BENTYL) tablet 20 mg  20 mg Oral TID AC & HS Danae Orleans, PA-C   20 mg at 10/31/17 0849  . diphenhydrAMINE (BENADRYL) 12.5 MG/5ML elixir 12.5-25 mg  12.5-25 mg Oral Q4H PRN Danae Orleans, PA-C      . docusate sodium (COLACE) capsule 100 mg  100 mg Oral BID Babish, Matthew, PA-C      . ferrous sulfate tablet 325 mg  325 mg Oral BID WC Danae Orleans, PA-C   325 mg at 10/31/17 0850   . HYDROcodone-acetaminophen (NORCO) 7.5-325 MG per tablet 1-2 tablet  1-2 tablet Oral Q4H PRN Danae Orleans, PA-C   1 tablet at 10/30/17 1800  . HYDROcodone-acetaminophen (NORCO/VICODIN) 5-325 MG per tablet 1-2 tablet  1-2 tablet Oral Q4H PRN Danae Orleans, PA-C   1 tablet at 10/31/17 0849  . insulin aspart (novoLOG) injection 0-15 Units  0-15 Units Subcutaneous TID WC Danae Orleans, PA-C   3 Units at 10/31/17 0850  . levothyroxine (SYNTHROID, LEVOTHROID) tablet 137 mcg  137 mcg Oral QHS Danae Orleans, PA-C   137 mcg at 10/30/17 2309  . loratadine (CLARITIN) tablet 10 mg  10 mg Oral Daily Danae Orleans, PA-C   10 mg at 10/31/17 1104  . magnesium citrate solution 1 Bottle  1 Bottle Oral Once PRN Babish, Matthew, PA-C      . menthol-cetylpyridinium (CEPACOL) lozenge 3 mg  1 lozenge Oral PRN Danae Orleans, PA-C       Or  . phenol (CHLORASEPTIC) mouth spray 1 spray  1 spray Mouth/Throat PRN Danae Orleans, PA-C      . metFORMIN (GLUCOPHAGE) tablet 1,000 mg  1,000 mg Oral BID WC Danae Orleans, PA-C   1,000 mg at 10/31/17 0849  . methocarbamol (ROBAXIN) tablet 500 mg  500 mg Oral Q6H PRN Danae Orleans, PA-C   500 mg at 10/31/17 1104   Or  . methocarbamol (ROBAXIN) 500 mg in dextrose 5 % 50 mL IVPB  500 mg Intravenous Q6H PRN Danae Orleans, PA-C   Stopped at 10/30/17 1300  . metoCLOPramide (REGLAN) tablet 5-10 mg  5-10 mg Oral Q8H PRN Danae Orleans, PA-C       Or  . metoCLOPramide (REGLAN) injection 5-10 mg  5-10 mg Intravenous Q8H PRN Danae Orleans, PA-C      . morphine 2 MG/ML injection 0.5-1 mg  0.5-1 mg Intravenous Q2H PRN Danae Orleans, PA-C   1 mg at 10/31/17 0350  . ondansetron (ZOFRAN) tablet 4 mg  4 mg Oral Q6H PRN Danae Orleans, PA-C   4 mg at 10/30/17 2314   Or  . ondansetron (ZOFRAN) injection 4 mg  4 mg Intravenous Q6H PRN Danae Orleans, PA-C   4 mg at 10/31/17 0400  . polyethylene glycol (MIRALAX / GLYCOLAX) packet 17 g  17 g Oral BID Babish, Matthew, PA-C       . traMADol Veatrice Bourbon) tablet 50 mg  50 mg Oral Q6H Danae Orleans, PA-C         Discharge Medications: Please see discharge summary for a list of discharge medications.  Relevant Imaging Results:  Relevant Lab Results:   Additional Information [id number]  Lia Hopping, LCSW

## 2017-11-01 DIAGNOSIS — M1711 Unilateral primary osteoarthritis, right knee: Secondary | ICD-10-CM | POA: Diagnosis not present

## 2017-11-01 LAB — BASIC METABOLIC PANEL
ANION GAP: 10 (ref 5–15)
BUN: 10 mg/dL (ref 6–20)
CHLORIDE: 100 mmol/L (ref 98–111)
CO2: 25 mmol/L (ref 22–32)
Calcium: 8.7 mg/dL — ABNORMAL LOW (ref 8.9–10.3)
Creatinine, Ser: 0.67 mg/dL (ref 0.44–1.00)
Glucose, Bld: 166 mg/dL — ABNORMAL HIGH (ref 70–99)
POTASSIUM: 4.4 mmol/L (ref 3.5–5.1)
Sodium: 135 mmol/L (ref 135–145)

## 2017-11-01 LAB — GLUCOSE, CAPILLARY
GLUCOSE-CAPILLARY: 184 mg/dL — AB (ref 70–99)
Glucose-Capillary: 165 mg/dL — ABNORMAL HIGH (ref 70–99)
Glucose-Capillary: 164 mg/dL — ABNORMAL HIGH (ref 70–99)
Glucose-Capillary: 196 mg/dL — ABNORMAL HIGH (ref 70–99)

## 2017-11-01 LAB — CBC
HCT: 39.7 % (ref 36.0–46.0)
HEMOGLOBIN: 12.6 g/dL (ref 12.0–15.0)
MCH: 29 pg (ref 26.0–34.0)
MCHC: 31.7 g/dL (ref 30.0–36.0)
MCV: 91.3 fL (ref 78.0–100.0)
PLATELETS: 283 10*3/uL (ref 150–400)
RBC: 4.35 MIL/uL (ref 3.87–5.11)
RDW: 15.5 % (ref 11.5–15.5)
WBC: 11.7 10*3/uL — AB (ref 4.0–10.5)

## 2017-11-01 NOTE — Progress Notes (Addendum)
Physical Therapy Treatment Patient Details Name: Wanda Oneill MRN: [id number] DOB: 07-24-1980 Today's Date: 11/01/2017    History of Present Illness Pt is a 37 YO female s/p R UKR on 10/30/17. PMH includes down syndrome, DMII, GERD, headaches, IBS, RLS, multiple hernia repairs.     PT Comments    The patient is improving  Today in  Mobility. Parents would like for patient to DC to home if able to achieve goals of steps by tomorrow. Recommend patient DC tomorrow to give more PT sessions for achieving goals. Recommend OT consult.  Follow Up Recommendations  Outpatient PT;Follow surgeon's recommendation for DC plan and follow-up therapies     Equipment Recommendations  None recommended by PT    Recommendations for Other Services  OT      Precautions / Restrictions Precautions Precautions: Fall;Knee Precaution Comments: used KI for PT ambulation Required Braces or Orthoses: Knee Immobilizer - Right Restrictions Other Position/Activity Restrictions: WBAT     Mobility  Bed Mobility               General bed mobility comments: in recliner  Transfers Overall transfer level: Needs assistance Equipment used: Rolling walker (2 wheeled) Transfers: Sit to/from Stand Sit to Stand: Min assist         General transfer comment: extra time to rise, cues for hand placement and Right leg, cues for hand placement to push from recliner.  Ambulation/Gait                 Stairs             Wheelchair Mobility    Modified Rankin (Stroke Patients Only)       Balance                                            Cognition Arousal/Alertness: Awake/alert                                            Exercises Total Joint Exercises Goniometric ROM: 5-60 knee flexion right knee in sitting    General Comments        Pertinent Vitals/Pain Pain Score: 3  Pain Location: R knee  Pain Descriptors / Indicators:  Sore;Tightness Pain Intervention(s): Monitored during session;Premedicated before session;Ice applied    Home Living                      Prior Function            PT Goals (current goals can now be found in the care plan section) Progress towards PT goals: Progressing toward goals    Frequency    7X/week      PT Plan Discharge plan needs to be updated    Co-evaluation              AM-PAC PT "6 Clicks" Daily Activity  Outcome Measure  Difficulty turning over in bed (including adjusting bedclothes, sheets and blankets)?: A Lot Difficulty moving from lying on back to sitting on the side of the bed? : A Lot Difficulty sitting down on and standing up from a chair with arms (e.g., wheelchair, bedside commode, etc,.)?: A Lot Help needed moving to and from a bed to chair (including a wheelchair)?: A  Lot Help needed walking in hospital room?: A Lot Help needed climbing 3-5 steps with a railing? : Total 6 Click Score: 11    End of Session Equipment Utilized During Treatment: Gait belt;Right knee immobilizer Activity Tolerance: Patient tolerated treatment well Patient left: in chair;with call bell/phone within reach Nurse Communication: Mobility status PT Visit Diagnosis: Difficulty in walking, not elsewhere classified (R26.2);Other abnormalities of gait and mobility (R26.89)     Time: 1324-4010 PT Time Calculation (min) (ACUTE ONLY): 32 min  Charges:  $Gait Training: 8-22 mins $Therapeutic Exercise: 8-22 mins                     Tresa Endo PT Acute Rehabilitation Services Pager [phone] Office [phone]    Claretha Cooper 11/01/2017, 12:55 PM

## 2017-11-01 NOTE — Progress Notes (Signed)
CSW spoke with patient and family regarding discharge plans. Family has decided to spend an additional day inpatient to have two more PT sessions and then discharge home. CSW updated facility on change of plans.  No more needs. CSW signing off.    Pricilla Holm, MSW, [place] [facility] [phone]

## 2017-11-01 NOTE — Progress Notes (Signed)
Subjective: 2 Days Post-Op Procedure(s) (LRB): UNICOMPARTMENTAL RIGHT KNEE LATERAL (Right) Patient reports pain as mild.  Pain well controlled. No other c/o. Voiding without difficulty. Plan for D/C to [facility] today.  Objective: Vital signs in last 24 hours: Temp:  [97.5 F (36.4 C)-98.2 F (36.8 C)] 98.1 F (36.7 C) (09/28 0629) Pulse Rate:  [74-86] 85 (09/28 0629) Resp:  [16-17] 16 (09/28 0626) BP: (133-142)/(84-97) 138/84 (09/28 0629) SpO2:  [93 %-97 %] 93 % (09/28 0629)  Intake/Output from previous day: 09/27 0701 - 09/28 0700 In: 1407.7 [P.O.:760; I.V.:647.7] Out: 3000 [Urine:3000] Intake/Output this shift: No intake/output data recorded.  Recent Labs    10/31/17 0447 11/01/17 0343  HGB 12.5 12.6   Recent Labs    10/31/17 0447 11/01/17 0343  WBC 12.3* 11.7*  RBC 4.25 4.35  HCT 39.0 39.7  PLT 252 283   Recent Labs    10/31/17 0447 11/01/17 0343  NA 135 135  K 4.2 4.4  CL 102 100  CO2 20* 25  BUN 9 10  CREATININE 0.71 0.67  GLUCOSE 185* 166*  CALCIUM 8.0* 8.7*   No results for input(s): LABPT, INR in the last 72 hours.  Neurologically intact ABD soft Neurovascular intact Sensation intact distally Intact pulses distally Dorsiflexion/Plantar flexion intact Incision: dressing C/D/I and no drainage No cellulitis present Compartment soft no calf pain or sign of DVT  Assessment/Plan: 2 Days Post-Op Procedure(s) (LRB): UNICOMPARTMENTAL RIGHT KNEE LATERAL (Right) Advance diet Up with therapy D/C IV fluids D/C to [facility] today Follow up 2 weeks p/op as outpt   BISSELL, JACLYN M. 11/01/2017, 10:01 AM

## 2017-11-01 NOTE — Progress Notes (Signed)
Notified Jennette Kettle, PA, re: pt, family & PT discussed home discharge vs SNF and believe she could go home after two more PT sessions. Order given to cancel discharge to SNF. Dereonna Lensing, [employer]

## 2017-11-01 NOTE — Discharge Summary (Signed)
Physician Discharge Summary   Patient ID: Wanda Oneill MRN: [id number] DOB/AGE: 05-03-1980 37 y.o.  Admit date: 10/30/2017 Discharge date:   Primary Diagnosis: primary osteoarthritis right knee  Admission Diagnoses:  Past Medical History:  Diagnosis Date  . Diabetes mellitus    type II  . Down syndrome   . Family history of adverse reaction to anesthesia    mom has had n/v  . Gastritis   . GERD (gastroesophageal reflux disease)   . Headache   . Hepatic steatosis   . Hidradenitis   . Hypothyroidism   . Irritable bowel syndrome   . Periumbilical hernia   . Pneumonia   . Restless legs   . Tendonitis    chronic in left foot  . Thyroid disease    hypothyroidism   Discharge Diagnoses:   Principal Problem:   S/P right UKR, lateral Active Problems:   Morbid obesity ([place])  Estimated body mass index is 40.72 kg/m as calculated from the following:   Height as of this encounter: 5' 4"  (1.626 m).   Weight as of this encounter: 107.6 kg.  Procedure:  Procedure(s) (LRB): UNICOMPARTMENTAL RIGHT KNEE LATERAL (Right)   Consults: None  HPI: see H&P Laboratory Data: Admission on 10/30/2017  Component Date Value Ref Range Status  . Glucose-Capillary 10/30/2017 168* 70 - 99 mg/dL Final  . Comment 1 10/30/2017 Notify RN   Final  . Comment 2 10/30/2017 Document in Chart   Final  . Glucose-Capillary 10/30/2017 131* 70 - 99 mg/dL Final  . Comment 1 10/30/2017 Notify RN   Final  . Comment 2 10/30/2017 Document in Chart   Final  . WBC 10/31/2017 12.3* 4.0 - 10.5 K/uL Final  . RBC 10/31/2017 4.25  3.87 - 5.11 MIL/uL Final  . Hemoglobin 10/31/2017 12.5  12.0 - 15.0 g/dL Final  . HCT 10/31/2017 39.0  36.0 - 46.0 % Final  . MCV 10/31/2017 91.8  78.0 - 100.0 fL Final  . MCH 10/31/2017 29.4  26.0 - 34.0 pg Final  . MCHC 10/31/2017 32.1  30.0 - 36.0 g/dL Final  . RDW 10/31/2017 15.6* 11.5 - 15.5 % Final  . Platelets 10/31/2017 252  150 - 400 K/uL Final   Performed at [facility], [place] [address], [place], [place] [address]  . Sodium 10/31/2017 135  135 - 145 mmol/L Final  . Potassium 10/31/2017 4.2  3.5 - 5.1 mmol/L Final  . Chloride 10/31/2017 102  98 - 111 mmol/L Final  . CO2 10/31/2017 20* 22 - 32 mmol/L Final  . Glucose, Bld 10/31/2017 185* 70 - 99 mg/dL Final  . BUN 10/31/2017 9  6 - 20 mg/dL Final  . Creatinine, Ser 10/31/2017 0.71  0.44 - 1.00 mg/dL Final  . Calcium 10/31/2017 8.0* 8.9 - 10.3 mg/dL Final  . GFR calc non Af Amer 10/31/2017 >60  >60 mL/min Final  . GFR calc Af Amer 10/31/2017 >60  >60 mL/min Final   Comment: (NOTE) The eGFR has been calculated using the CKD EPI equation. This calculation has not been validated in all clinical situations. eGFR's persistently <60 mL/min signify possible Chronic Kidney Disease.   Georgiann Hahn gap 10/31/2017 13  5 - 15 Final   Performed at [facility], [place] [address] [place],  [address]  . Glucose-Capillary 10/30/2017 144* 70 - 99 mg/dL Final  . Glucose-Capillary 10/30/2017 172* 70 - 99 mg/dL Final  . Glucose-Capillary 10/31/2017 175* 70 - 99 mg/dL Final  . Glucose-Capillary  10/31/2017 160* 70 - 99 mg/dL Final  . Glucose-Capillary 10/31/2017 200* 70 - 99 mg/dL Final  . WBC 11/01/2017 11.7* 4.0 - 10.5 K/uL Final  . RBC 11/01/2017 4.35  3.87 - 5.11 MIL/uL Final  . Hemoglobin 11/01/2017 12.6  12.0 - 15.0 g/dL Final  . HCT 11/01/2017 39.7  36.0 - 46.0 % Final  . MCV 11/01/2017 91.3  78.0 - 100.0 fL Final  . MCH 11/01/2017 29.0  26.0 - 34.0 pg Final  . MCHC 11/01/2017 31.7  30.0 - 36.0 g/dL Final  . RDW 11/01/2017 15.5  11.5 - 15.5 % Final  . Platelets 11/01/2017 283  150 - 400 K/uL Final   Performed at [facility], [place] [address] [place], [place] [address]  . Sodium 11/01/2017 135  135 - 145 mmol/L Final  . Potassium 11/01/2017 4.4  3.5 - 5.1 mmol/L Final  . Chloride 11/01/2017 100  98 - 111 mmol/L Final  . CO2 11/01/2017 25  22 - 32 mmol/L  Final  . Glucose, Bld 11/01/2017 166* 70 - 99 mg/dL Final  . BUN 11/01/2017 10  6 - 20 mg/dL Final  . Creatinine, Ser 11/01/2017 0.67  0.44 - 1.00 mg/dL Final  . Calcium 11/01/2017 8.7* 8.9 - 10.3 mg/dL Final  . GFR calc non Af Amer 11/01/2017 >60  >60 mL/min Final  . GFR calc Af Amer 11/01/2017 >60  >60 mL/min Final   Comment: (NOTE) The eGFR has been calculated using the CKD EPI equation. This calculation has not been validated in all clinical situations. eGFR's persistently <60 mL/min signify possible Chronic Kidney Disease.   Georgiann Hahn gap 11/01/2017 10  5 - 15 Final   Performed at [facility], [place] [address] [place], [place] [address]  . Glucose-Capillary 10/31/2017 147* 70 - 99 mg/dL Final  . Glucose-Capillary 11/01/2017 165* 70 - 99 mg/dL Final  . Comment 1 11/01/2017 Notify RN   Final  . Comment 2 11/01/2017 Document in [facility] Outpatient Visit on 10/20/2017  Component Date Value Ref Range Status  . MRSA, PCR 10/20/2017 NEGATIVE  NEGATIVE Final  . Staphylococcus aureus 10/20/2017 NEGATIVE  NEGATIVE Final   Comment: (NOTE) The Xpert SA Assay (FDA approved for NASAL specimens in patients 35 years of age and older), is one component of a comprehensive surveillance program. It is not intended to diagnose infection nor to guide or monitor treatment. Performed at [facility], [place] [address], [place], [place] [address]   . ABO/RH(D) 10/20/2017 A POS   Final  . Antibody Screen 10/20/2017 NEG   Final  . Sample Expiration 10/20/2017 11/02/2017   Final  . Extend sample reason 10/20/2017    Final                   Value:NO TRANSFUSIONS OR PREGNANCY IN THE PAST 3 MONTHS Performed at [facility], [place] [address] [place], [place] [address]   . Sodium 10/20/2017 140  135 - 145 mmol/L Final  . Potassium 10/20/2017 4.7  3.5 - 5.1 mmol/L Final  . Chloride 10/20/2017 101  98 - 111 mmol/L Final  . CO2 10/20/2017 27   22 - 32 mmol/L Final  . Glucose, Bld 10/20/2017 172* 70 - 99 mg/dL Final  . BUN 10/20/2017 15  6 - 20 mg/dL Final  . Creatinine, Ser 10/20/2017 0.68  0.44 - 1.00 mg/dL Final  . Calcium 10/20/2017 9.4  8.9 - 10.3 mg/dL Final  .  GFR calc non Af Amer 10/20/2017 >60  >60 mL/min Final  . GFR calc Af Amer 10/20/2017 >60  >60 mL/min Final   Comment: (NOTE) The eGFR has been calculated using the CKD EPI equation. This calculation has not been validated in all clinical situations. eGFR's persistently <60 mL/min signify possible Chronic Kidney Disease.   Georgiann Hahn gap 10/20/2017 12  5 - 15 Final   Performed at [facility], [place] [address], [place], [place] [address]  . Hgb A1c MFr Bld 10/20/2017 7.2* 4.8 - 5.6 % Final   Comment: (NOTE)         Prediabetes: 5.7 - 6.4         Diabetes: >6.4         Glycemic control for adults with diabetes: <7.0   . Mean Plasma Glucose 10/20/2017 160  mg/dL Final   Comment: (NOTE) Performed At: [facility] [place], [place] [id number] Rush Farmer MD [id number]   . WBC 10/20/2017 7.4  4.0 - 10.5 K/uL Final  . RBC 10/20/2017 4.78  3.87 - 5.11 MIL/uL Final  . Hemoglobin 10/20/2017 14.1  12.0 - 15.0 g/dL Final  . HCT 10/20/2017 43.9  36.0 - 46.0 % Final  . MCV 10/20/2017 91.8  78.0 - 100.0 fL Final  . MCH 10/20/2017 29.5  26.0 - 34.0 pg Final  . MCHC 10/20/2017 32.1  30.0 - 36.0 g/dL Final  . RDW 10/20/2017 15.7* 11.5 - 15.5 % Final  . Platelets 10/20/2017 300  150 - 400 K/uL Final   Performed at [facility], [place] [address], [place], [place] [address]  . Preg, Serum 10/20/2017 NEGATIVE  NEGATIVE Final   Comment:        THE SENSITIVITY OF THIS METHODOLOGY IS >10 mIU/mL. Performed at [facility], [place] [address], [place], [place] [address]   . Glucose-Capillary 10/20/2017 203* 70 - 99 mg/dL Final  . ABO/RH(D) 10/20/2017    Final                   Value:A POS Performed at  [facility], [place] [address], [place], [place] [address]      X-Rays:No results found.  EKG: Orders placed or performed during the hospital encounter of 10/20/17  . EKG 12 lead  . EKG 12 lead     Hospital Course: ASLYNN BRUNETTI is a 37 y.o. who was admitted to [facility]. They were brought to the operating room on 10/30/2017 and underwent Procedure(s): UNICOMPARTMENTAL RIGHT KNEE LATERAL.  Patient tolerated the procedure well and was later transferred to the recovery room and then to the orthopaedic floor for postoperative care.  They were given PO and IV analgesics for pain control following their surgery.  They were given 24 hours of postoperative antibiotics of  Anti-infectives (From admission, onward)   Start     Dose/Rate Route Frequency Ordered Stop   10/30/17 1430  clindamycin (CLEOCIN) IVPB 600 mg     600 mg 100 mL/hr over 30 Minutes Intravenous Every 6 hours 10/30/17 1341 10/30/17 2025   10/30/17 0845  clindamycin (CLEOCIN) IVPB 900 mg     900 mg 100 mL/hr over 30 Minutes Intravenous  Once 10/30/17 0831 10/30/17 0908   10/30/17 0642  gentamicin (GARAMYCIN) 380 mg in dextrose 5 % 100 mL IVPB  Status:  Discontinued     5 mg/kg  75.9 kg (Adjusted) 109.5 mL/hr over 60 Minutes Intravenous Every 24 hours  10/30/17 9381 10/30/17 1337   10/30/17 0600  vancomycin (VANCOCIN) 1,500 mg in sodium chloride 0.9 % 500 mL IVPB     1,500 mg 250 mL/hr over 120 Minutes Intravenous On call to O.R. 10/29/17 1330 10/30/17 0825   10/30/17 0600  gentamicin (GARAMYCIN) 380 mg in dextrose 5 % 100 mL IVPB  Status:  Discontinued     5 mg/kg  75.9 kg (Adjusted) 219 mL/hr over 30 Minutes Intravenous 30 min pre-op 10/29/17 1343 10/30/17 1337     and started on DVT prophylaxis in the form of Aspirin, Foot Pumps and TED hose.   PT and OT were ordered for total joint protocol.  Discharge planning consulted to help with postop disposition and equipment needs.  Patient had a good  night on the evening of surgery.  They started to get up OOB with therapy on day one. Continued to work with therapy into day two. By day two, the patient had progressed with therapy and meeting their goals.  Incision was healing well.  Patient was seen in rounds and was ready for discharge.   Diet: Regular diet Activity:WBAT Follow-up:in 14 days Disposition - [place] Discharged Condition: good   Discharge Instructions    Call MD / Call 911   Complete by:  As directed    If you experience chest pain or shortness of breath, CALL 911 and be transported to the hospital emergency room.  If you develope a fever above 101 F, pus (white drainage) or increased drainage or redness at the wound, or calf pain, call your surgeon's office.   Call MD / Call 911   Complete by:  As directed    If you experience chest pain or shortness of breath, CALL 911 and be transported to the hospital emergency room.  If you develope a fever above 101 F, pus (white drainage) or increased drainage or redness at the wound, or calf pain, call your surgeon's office.   Change dressing   Complete by:  As directed    Maintain surgical dressing until follow up in the clinic. If the edges start to pull up, may reinforce with tape. If the dressing is no longer working, may remove and cover with gauze and tape, but must keep the area dry and clean.  Call with any questions or concerns.   Constipation Prevention   Complete by:  As directed    Drink plenty of fluids.  Prune juice may be helpful.  You may use a stool softener, such as Colace (over the counter) 100 mg twice a day.  Use MiraLax (over the counter) for constipation as needed.   Constipation Prevention   Complete by:  As directed    Drink plenty of fluids.  Prune juice may be helpful.  You may use a stool softener, such as Colace (over the counter) 100 mg twice a day.  Use MiraLax (over the counter) for constipation as needed.   Diet - low  sodium heart healthy   Complete by:  As directed    Diet - low sodium heart healthy   Complete by:  As directed    Discharge instructions   Complete by:  As directed    Maintain surgical dressing until follow up in the clinic. If the edges start to pull up, may reinforce with tape. If the dressing is no longer working, may remove and cover with gauze and tape, but must keep the area dry and clean.  Follow up in 2  weeks at [facility]. Call with any questions or concerns.   Increase activity slowly as tolerated   Complete by:  As directed    Weight bearing as tolerated with assist device (walker, cane, etc) as directed, use it as long as suggested by your surgeon or therapist, typically at least 4-6 weeks.   Increase activity slowly as tolerated   Complete by:  As directed    TED hose   Complete by:  As directed    Use stockings (TED hose) for 2 weeks on both leg(s).  You may remove them at night for sleeping.     Allergies as of 11/01/2017      Reactions   Vancomycin Itching   Cefuroxime Hives, Swelling   Unspecified location of swelling   Cefuroxime Axetil Hives   Sulfa Antibiotics Hives      Medication List    STOP taking these medications   acetaminophen 650 MG CR tablet Commonly known as:  TYLENOL   baclofen 10 MG tablet Commonly known as:  LIORESAL   naproxen sodium 220 MG tablet Commonly known as:  ALEVE     TAKE these medications   allopurinol 300 MG tablet Commonly known as:  ZYLOPRIM Take 1 tablet (300 mg total) by mouth daily.   aspirin 81 MG chewable tablet Chew 1 tablet (81 mg total) by mouth 2 (two) times daily. Take for 4 weeks, then resume regular dose.   blood glucose meter kit and supplies Dispense based on patient and insurance preference. To check blood sugars daily FOR ICD-9 250.00   cetirizine 10 MG tablet Commonly known as:  ZYRTEC Take 10 mg by mouth daily as needed for allergies.   cholestyramine 4 g packet Commonly known as:   QUESTRAN DISSOLVE & TAKE 1 PACKET ( 4 GM TOTAL ) BY MOUTH IN THE MORNING   clobetasol cream 0.05 % Commonly known as:  TEMOVATE Apply 1 application topically daily as needed (for eczema).   dicyclomine 20 MG tablet Commonly known as:  BENTYL Take 1 tablet (20 mg total) by mouth 4 (four) times daily -  before meals and at bedtime.   docusate sodium 100 MG capsule Commonly known as:  COLACE Take 1 capsule (100 mg total) by mouth 2 (two) times daily.   ferrous sulfate 325 (65 FE) MG tablet Take 1 tablet (325 mg total) by mouth 3 (three) times daily with meals.   HYDROcodone-acetaminophen 7.5-325 MG tablet Commonly known as:  NORCO Take 1-2 tablets by mouth every 4 (four) hours as needed for moderate pain.   IBGARD 90 MG Cpcr Generic drug:  Peppermint Oil Take 1 capsule by mouth daily as needed (IBS).   JARDIANCE 25 MG Tabs tablet Generic drug:  empagliflozin Take 25 mg by mouth daily.   levonorgestrel-ethinyl estradiol 0.15-0.03 MG tablet Commonly known as:  SEASONALE,INTROVALE,JOLESSA Take 1 tablet by mouth daily.   levothyroxine 137 MCG tablet Commonly known as:  SYNTHROID, LEVOTHROID Take 1 tablet (137 mcg total) by mouth daily before breakfast. What changed:  when to take this   metFORMIN 1000 MG tablet Commonly known as:  GLUCOPHAGE Take 1,000 mg by mouth 2 (two) times daily.   methocarbamol 500 MG tablet Commonly known as:  ROBAXIN Take 1 tablet (500 mg total) by mouth every 6 (six) hours as needed for muscle spasms.   ONE TOUCH ULTRA TEST test strip Generic drug:  glucose blood USE TO CHECK BLOOD SUGAR ONCE DAILY (ICD CODE:25.00)   OZEMPIC (0.25 OR 0.5 MG/DOSE)  2 MG/1.5ML Sopn Generic drug:  Semaglutide(0.25 or 0.5MG/DOS) Inject 0.5 mg into the skin once a week. Saturdays   polyethylene glycol packet Commonly known as:  MIRALAX / GLYCOLAX Take 17 g by mouth 2 (two) times daily.            Discharge Care Instructions  (From admission, onward)          Start     Ordered   10/31/17 0000  Change dressing    Comments:  Maintain surgical dressing until follow up in the clinic. If the edges start to pull up, may reinforce with tape. If the dressing is no longer working, may remove and cover with gauze and tape, but must keep the area dry and clean.  Call with any questions or concerns.   10/31/17 0804         Follow-up Information    Paralee Cancel, MD. Schedule an appointment as soon as possible for a visit in 2 weeks.   Specialty:  Orthopedic Surgery Contact information: [address] [place] [address] [phone]           Signed: Lacie Draft PA-C Orthopaedic Surgery 11/01/2017, 10:07 AM

## 2017-11-01 NOTE — Progress Notes (Signed)
Physical Therapy Treatment Patient Details Name: Wanda Oneill MRN: [id number] DOB: Jun 22, 1980 Today's Date: 11/01/2017    History of Present Illness Pt is a 37 YO female s/p R UKR on 10/30/17. PMH includes down syndrome, DMII, GERD, headaches, IBS, RLS, multiple hernia repairs.     PT Comments    Patient is progressing well. Plans to Dc  To parent's home. Practice steps in AM. Plans OPPT.   Follow Up Recommendations  Outpatient PT;Follow surgeon's recommendation for DC plan and follow-up therapies     Equipment Recommendations  None recommended by PT    Recommendations for Other Services       Precautions / Restrictions Precautions Precautions: Fall;Knee Precaution Comments: did not use this visit Restrictions Weight Bearing Restrictions: No Other Position/Activity Restrictions: WBAT     Mobility  Bed Mobility   Bed Mobility: Supine to Sit;Sit to Supine, patient practiced with leg lifter           General bed mobility comments: practiced with leg lifter, min assist  for supporting right leg during transition  Transfers Overall transfer level: Needs assistance Equipment used: Rolling walker (2 wheeled) Transfers: Sit to/from Stand Sit to Stand: Min assist         General transfer comment: extra time to rise, cues for hand placement   Ambulation/Gait Ambulation/Gait assistance: Min guard;Min assist Gait Distance (Feet): 50 Feet(then 25) Assistive device: Rolling walker (2 wheeled) Gait Pattern/deviations: Step-to pattern;Step-through pattern     General Gait Details: much improved step length . Patient able to progress ambulation.   Stairs             Wheelchair Mobility    Modified Rankin (Stroke Patients Only)       Balance Overall balance assessment: Needs assistance Sitting-balance support: Feet supported;No upper extremity supported Sitting balance-Leahy Scale: Good     Standing balance support: During functional activity;No  upper extremity supported Standing balance-Leahy Scale: Fair                              Cognition Arousal/Alertness: Awake/alert Behavior During Therapy: WFL for tasks assessed/performed Overall Cognitive Status: Within Functional Limits for tasks assessed                                        Exercises      General Comments        Pertinent Vitals/Pain Pain Assessment: Faces Pain Score: 7  Faces Pain Scale: Hurts little more Pain Location: R knee  Pain Descriptors / Indicators: Aching;Sore;Tightness Pain Intervention(s): Monitored during session;Premedicated before session;Ice applied    Home Living Family/patient expects to be discharged to:: Private residence Living Arrangements: Parent(2-3 weeks as needed) Available Help at Discharge: Family;Available PRN/intermittently Type of Home: House Home Access: Stairs to enter Entrance Stairs-Rails: Right Home Layout: Two level;Able to live on main level with bedroom/bathroom Home Equipment: Toilet riser;Walker - 2 wheels;Cane - single point      Prior Function Level of Independence: Independent          PT Goals (current goals can now be found in the care plan section) Acute Rehab PT Goals Patient Stated Goal: go home with parents  Progress towards PT goals: Progressing toward goals    Frequency    7X/week      PT Plan Current plan remains appropriate    Co-evaluation  AM-PAC PT "6 Clicks" Daily Activity  Outcome Measure  Difficulty turning over in bed (including adjusting bedclothes, sheets and blankets)?: A Lot Difficulty moving from lying on back to sitting on the side of the bed? : A Lot Difficulty sitting down on and standing up from a chair with arms (e.g., wheelchair, bedside commode, etc,.)?: A Lot Help needed moving to and from a bed to chair (including a wheelchair)?: A Lot Help needed walking in hospital room?: A Lot Help needed climbing 3-5  steps with a railing? : Total 6 Click Score: 11    End of Session   Activity Tolerance: Patient tolerated treatment well Patient left: in chair;with call bell/phone within reach;with family/visitor present Nurse Communication: Mobility status PT Visit Diagnosis: Difficulty in walking, not elsewhere classified (R26.2);Other abnormalities of gait and mobility (R26.89)     Time: 3903-0092 PT Time Calculation (min) (ACUTE ONLY): 42 min  Charges:  $Gait Training: 23-37 mins $Self Care/Home Management: [place] Pager [phone] Office [phone]    Claretha Cooper 11/01/2017, 6:06 PM

## 2017-11-01 NOTE — Evaluation (Signed)
Occupational Therapy Evaluation Patient Details Name: Wanda Oneill MRN: [id number] DOB: 06/12/1980 Today's Date: 11/01/2017    History of Present Illness Pt is a 37 YO female s/p R UKR on 10/30/17. PMH includes down syndrome, DMII, GERD, headaches, IBS, RLS, multiple hernia repairs.    Clinical Impression   This 37 yo female admitted and underwent above presents to acute OT with decreased AROM R knee, increased pain right knee, and decreased  Balance all affecting her safety and independence with basic ADLs. She will benefit from acute OT with follow up [place] to get back to [place] where she lives alone.    Follow Up Recommendations  Home health OT;Supervision/Assistance - 24 hour    Equipment Recommendations  3 in 1 bedside commode       Precautions / Restrictions Precautions Precautions: Fall;Knee Precaution Comments: ambulated to bathroom and back without KI--pt did well (no knee buckling) Required Braces or Orthoses: Knee Immobilizer - Right Restrictions Weight Bearing Restrictions: No Other Position/Activity Restrictions: WBAT       Mobility Bed Mobility               General bed mobility comments: Pt up in recliner upon arrival  Transfers Overall transfer level: Needs assistance Equipment used: Rolling walker (2 wheeled) Transfers: Sit to/from Stand Sit to Stand: Min assist         General transfer comment: extra time to rise, cues for hand placement     Balance Overall balance assessment: Needs assistance Sitting-balance support: Feet supported;No upper extremity supported Sitting balance-Leahy Scale: Good     Standing balance support: During functional activity;No upper extremity supported Standing balance-Leahy Scale: Fair                             ADL either performed or assessed with clinical judgement   ADL Overall ADL's : Needs assistance/impaired Eating/Feeding: Independent;Sitting   Grooming: Min guard;Wash/dry  hands;Standing Grooming Details (indicate cue type and reason): at sink in bathroom Upper Body Bathing: Set up;Sitting   Lower Body Bathing: Moderate assistance Lower Body Bathing Details (indicate cue type and reason): min A sit<>stand Upper Body Dressing : Set up;Sitting   Lower Body Dressing: Moderate assistance Lower Body Dressing Details (indicate cue type and reason): min A sit<>stand Toilet Transfer: Minimal assistance;Ambulation;RW;BSC Toilet Transfer Details (indicate cue type and reason): over toilet, with A for RLE to prop on trashcan once seated on 3n1 and to help with leg off of trash can with sit<>stand Toileting- Clothing Manipulation and Hygiene: Minimal assistance;Sit to/from stand               Vision Patient Visual Report: No change from baseline              Pertinent Vitals/Pain Pain Assessment: 0-10 Pain Score: 7  Pain Location: R knee  Pain Descriptors / Indicators: Aching;Sore;Tightness Pain Intervention(s): Limited activity within patient's tolerance;Monitored during session;Repositioned;Ice applied     Hand Dominance Right   Extremity/Trunk Assessment Upper Extremity Assessment Upper Extremity Assessment: Overall WFL for tasks assessed           Communication Communication Communication: No difficulties   Cognition Arousal/Alertness: Awake/alert Behavior During Therapy: WFL for tasks assessed/performed Overall Cognitive Status: Within Functional Limits for tasks assessed  Home Living Family/patient expects to be discharged to:: Private residence Living Arrangements: Parent(2-3 weeks as needed) Available Help at Discharge: Family;Available PRN/intermittently Type of Home: House Home Access: Stairs to enter [employer] of Steps: 2 Entrance Stairs-Rails: Right Home Layout: Two level;Able to live on main level with bedroom/bathroom     Bathroom Shower/Tub:  Occupational psychologist: Standard     Home Equipment: Toilet riser;Walker - 2 wheels;Cane - single point          Prior Functioning/Environment Level of Independence: Independent                 OT Problem List: Decreased range of motion;Impaired balance (sitting and/or standing);Pain;Obesity;Decreased knowledge of use of DME or AE      OT Treatment/Interventions: Self-care/ADL training;Balance training;DME and/or AE instruction;Patient/family education    OT Goals(Current goals can be found in the care plan section) Acute Rehab OT Goals Patient Stated Goal: go home with parents  OT Goal Formulation: With patient Time For Goal Achievement: 11/08/17 Potential to Achieve Goals: Good  OT Frequency: Min 2X/week              AM-PAC PT "6 Clicks" Daily Activity     Outcome Measure Help from another person eating meals?: None Help from another person taking care of personal grooming?: A Little Help from another person toileting, which includes using toliet, bedpan, or urinal?: A Little Help from another person bathing (including washing, rinsing, drying)?: A Lot Help from another person to put on and taking off regular upper body clothing?: A Little Help from another person to put on and taking off regular lower body clothing?: A Lot 6 Click Score: 17   End of Session Equipment Utilized During Treatment: Gait belt;Rolling walker  Activity Tolerance: Patient tolerated treatment well Patient left: in chair;with call bell/phone within reach  OT Visit Diagnosis: Unsteadiness on feet (R26.81);Other abnormalities of gait and mobility (R26.89);Pain Pain - Right/Left: Right Pain - part of body: Knee                Time: 8469-6295 OT Time Calculation (min): 42 min Charges:  OT General Charges $OT Visit: 1 Visit OT Evaluation $OT Eval Moderate Complexity: 1 Mod OT Treatments $Self Care/Home Management : 23-37 mins  Golden Circle, OTR/L Acute [employer] Pager [phone] Office [phone]

## 2017-11-02 DIAGNOSIS — M1711 Unilateral primary osteoarthritis, right knee: Secondary | ICD-10-CM | POA: Diagnosis not present

## 2017-11-02 LAB — GLUCOSE, CAPILLARY
Glucose-Capillary: 201 mg/dL — ABNORMAL HIGH (ref 70–99)
Glucose-Capillary: 126 mg/dL — ABNORMAL HIGH (ref 70–99)
Glucose-Capillary: 165 mg/dL — ABNORMAL HIGH (ref 70–99)

## 2017-11-02 NOTE — Plan of Care (Signed)
  Problem: Health Behavior/Discharge Planning: Goal: Ability to manage health-related needs will improve Outcome: Progressing   Problem: Clinical Measurements: Goal: Ability to maintain clinical measurements within normal limits will improve Outcome: Progressing Goal: Will remain free from infection Outcome: Progressing Goal: Diagnostic test results will improve Outcome: Progressing   Problem: Coping: Goal: Level of anxiety will decrease Outcome: Progressing   Problem: Education: Goal: Knowledge of the prescribed therapeutic regimen will improve Outcome: Progressing Goal: Individualized Educational Video(s) Outcome: Progressing   Problem: Clinical Measurements: Goal: Postoperative complications will be avoided or minimized Outcome: Progressing   Problem: Skin Integrity: Goal: Will show signs of wound healing Outcome: Progressing

## 2017-11-02 NOTE — Progress Notes (Signed)
Discharged from floor via w/c for transport home by car. Belongings & mother with pt. No changes in assessment. Karyn Brull, [employer]

## 2017-11-02 NOTE — Progress Notes (Signed)
Occupational Therapy Treatment Patient Details Name: Wanda Oneill MRN: [id number] DOB: 07-Aug-1980 Today's Date: 11/02/2017    History of present illness Pt is a 37 YO female s/p R UKR on 10/30/17. PMH includes down syndrome, DMII, GERD, headaches, IBS, RLS, multiple hernia repairs.    OT comments  Pt is progressing toward stated goals. Focused session on toilet t/f and LB ADL training. Pt completed toilet t/f with BSC over toilet, needing in A and cues for hand placement. Toilet hygiene needing min A to use sitting/lateral leans. Introduced LB ADL equipment. Pt with long handled reacher at baseline, educated on how to implement into LB dressing routine, pt in understanding. Pt wanting to acquire sock aide, stating that is her biggest challenge this date. Practiced sock aide, needing min A and VC's. Ice applied at end of session for pain management, pt very ind in directing goals of care. Will continue to follow acutely, d/c plans remain appropriate.   Follow Up Recommendations  Home health OT;Supervision/Assistance - 24 hour    Equipment Recommendations  3 in 1 bedside commode    Recommendations for Other Services      Precautions / Restrictions Precautions Precautions: Fall;Knee Precaution Comments: used KI for func mobility Required Braces or Orthoses: Knee Immobilizer - Right Restrictions Weight Bearing Restrictions: No Other Position/Activity Restrictions: WBAT        Mobility Bed Mobility Overal bed mobility: Needs Assistance Bed Mobility: Supine to Sit     Supine to sit: Min assist Sit to supine: Min assist   General bed mobility comments: min A for support of RLE with leg lifter  Transfers Overall transfer level: Needs assistance Equipment used: Rolling walker (2 wheeled) Transfers: Sit to/from Stand Sit to Stand: Min assist Stand pivot transfers: Min assist       General transfer comment: extra time to push into standing, needing cues for appropriate hand  placement    Balance Overall balance assessment: Needs assistance Sitting-balance support: Feet supported;No upper extremity supported Sitting balance-Leahy Scale: Good     Standing balance support: During functional activity;Bilateral upper extremity supported Standing balance-Leahy Scale: Fair                             ADL either performed or assessed with clinical judgement   ADL Overall ADL's : Needs assistance/impaired                     Lower Body Dressing: Minimal assistance;Cueing for compensatory techniques;With adaptive equipment Lower Body Dressing Details (indicate cue type and reason): using sock aide Toilet Transfer: Minimal assistance;Regular Toilet;Comfort height toilet;RW;Ambulation Toilet Transfer Details (indicate cue type and reason): BSC over toilet, pt needing RLE propped on trash can, needin gmin A to get off toilet Toileting- Clothing Manipulation and Hygiene: Minimal assistance;Sitting/lateral lean Toileting - Clothing Manipulation Details (indicate cue type and reason): pt initiates with sitting/lateral leans, needing VC's and min A for accuracy of cleaning       General ADL Comments: focused session on toilet t/f and LB ADLs. Pt completed toilet t/f with BSC over toilet. Introduced AE, pt reports having long Acupuncturist. Instructed on use for LB dressing. Pt practiced with sock aide, wishing to acquire one.     Vision Baseline Vision/History: Wears glasses Wears Glasses: At all times Patient Visual Report: No change from baseline     Perception     Praxis      Cognition Arousal/Alertness: Awake/alert  Behavior During Therapy: WFL for tasks assessed/performed Overall Cognitive Status: Within Functional Limits for tasks assessed                                 General Comments: PMH of down syndrome        Exercises   Shoulder Instructions       General Comments      Pertinent Vitals/ Pain        Pain Assessment: Faces Pain Score: 7  Faces Pain Scale: Hurts little more Pain Location: R knee  Pain Descriptors / Indicators: Sore Pain Intervention(s): Limited activity within patient's tolerance;Monitored during session;Repositioned  Home Living                                          Prior Functioning/Environment              Frequency  Min 2X/week        Progress Toward Goals  OT Goals(current goals can now be found in the care plan section)  Progress towards OT goals: Progressing toward goals  Acute Rehab OT Goals Patient Stated Goal: go home and do things for myself Time For Goal Achievement: 11/08/17 Potential to Achieve Goals: Good  Plan Discharge plan remains appropriate    Co-evaluation      Reason for Co-Treatment: For patient/therapist safety(for a portion of session) PT goals addressed during session: Mobility/safety with mobility;Strengthening/ROM        AM-PAC PT "6 Clicks" Daily Activity     Outcome Measure   Help from another person eating meals?: None Help from another person taking care of personal grooming?: A Little Help from another person toileting, which includes using toliet, bedpan, or urinal?: A Little Help from another person bathing (including washing, rinsing, drying)?: A Lot Help from another person to put on and taking off regular upper body clothing?: A Little Help from another person to put on and taking off regular lower body clothing?: A Lot 6 Click Score: 17    End of Session Equipment Utilized During Treatment: Gait belt;Rolling walker;Right knee immobilizer  OT Visit Diagnosis: Unsteadiness on feet (R26.81);Other abnormalities of gait and mobility (R26.89);Pain Pain - Right/Left: Right Pain - part of body: Knee   Activity Tolerance Patient tolerated treatment well   Patient Left in chair;with call bell/phone within reach   Nurse Communication          Time: 0900-0920 OT Time Calculation  (min): 20 min  Charges: OT General Charges $OT Visit: 1 Visit OT Treatments $Self Care/Home Management : 8-22 mins  Zenovia Jarred, MSOT, OTR/L [place] OT/ Acute Relief OT Pager: [phone]  Zenovia Jarred 11/02/2017, 10:18 AM

## 2017-11-02 NOTE — Progress Notes (Signed)
Physical Therapy Treatment Patient Details Name: Wanda Oneill MRN: [id number] DOB: 08-20-1980 Today's Date: 11/02/2017    History of Present Illness Pt is a 37 YO female s/p R UKR on 10/30/17. PMH includes down syndrome, DMII, GERD, headaches, IBS, RLS, multiple hernia repairs.     PT Comments     Pt able to do standard size steps with min A and heavy reliance on rail. Her mother was present and feels they will be able to help her as needed. Pt works hard, but fatigues quickly.  Follow Up Recommendations  Outpatient PT;Follow surgeon's recommendation for DC plan and follow-up therapies     Equipment Recommendations  None recommended by PT    Recommendations for Other Services       Precautions / Restrictions Precautions Precautions: Fall;Knee Precaution Comments: used KI for func mobility Required Braces or Orthoses: Knee Immobilizer - Right Restrictions Weight Bearing Restrictions: No Other Position/Activity Restrictions: WBAT     Mobility  Bed Mobility               General bed mobility comments: in chair upon arrival  Transfers Overall transfer level: Needs assistance Equipment used: Rolling walker (2 wheeled) Transfers: Sit to/from Stand Sit to Stand: Min guard         General transfer comment: Able to stand with min/guard, but with a bit too much forward flexion. Pt educated on not leaning to far forward to avoid momentum making her fall forward  Ambulation/Gait Ambulation/Gait assistance: Min guard Gait Distance (Feet): 75 Feet Assistive device: Rolling walker (2 wheeled) Gait Pattern/deviations: Step-through pattern;Trunk flexed     General Gait Details: Pt fatigued with gait and needed several standing rest breaks   Stairs Stairs: Yes Stairs assistance: Min assist Stair Management: One rail Right;Sideways;Step to pattern Number of Stairs: 2 General stair comments: Pt with heavy reliance on rail with forward flexion. Mom present and felt  she and husband would be able to help   Wheelchair Mobility    Modified Rankin (Stroke Patients Only)       Balance Overall balance assessment: Needs assistance         Standing balance support: During functional activity;Bilateral upper extremity supported Standing balance-Leahy Scale: Fair                              Cognition Arousal/Alertness: Awake/alert Behavior During Therapy: WFL for tasks assessed/performed Overall Cognitive Status: Within Functional Limits for tasks assessed                                 General Comments: PMH of down syndrome      Exercises      General Comments        Pertinent Vitals/Pain Pain Assessment: Faces Faces Pain Scale: Hurts little more Pain Location: R knee  Pain Descriptors / Indicators: Sore Pain Intervention(s): Premedicated before session;Limited activity within patient's tolerance;Repositioned;Ice applied;Monitored during session    Home Living                      Prior Function            PT Goals (current goals can now be found in the care plan section) Acute Rehab PT Goals Patient Stated Goal: go home and do things for myself PT Goal Formulation: With patient/family Time For Goal Achievement: 11/06/17 Potential to Achieve Goals:  Good Progress towards PT goals: Progressing toward goals    Frequency    7X/week      PT Plan Current plan remains appropriate    Co-evaluation              AM-PAC PT "6 Clicks" Daily Activity  Outcome Measure  Difficulty turning over in bed (including adjusting bedclothes, sheets and blankets)?: A Lot Difficulty moving from lying on back to sitting on the side of the bed? : Unable Difficulty sitting down on and standing up from a chair with arms (e.g., wheelchair, bedside commode, etc,.)?: A Lot Help needed moving to and from a bed to chair (including a wheelchair)?: A Little Help needed walking in hospital room?: A  Little Help needed climbing 3-5 steps with a railing? : A Lot 6 Click Score: 13    End of Session Equipment Utilized During Treatment: Gait belt;Right knee immobilizer Activity Tolerance: Patient tolerated treatment well Patient left: in chair;with call bell/phone within reach;with family/visitor present Nurse Communication: Mobility status PT Visit Diagnosis: Difficulty in walking, not elsewhere classified (R26.2);Other abnormalities of gait and mobility (R26.89)     Time: 7867-6720 PT Time Calculation (min) (ACUTE ONLY): 22 min  Charges:  $Gait Training: 8-22 mins                     Patric Buckhalter L. Tamala Julian, [place] Pager [phone] 11/02/2017    [name] 11/02/2017, 3:17 PM

## 2017-11-02 NOTE — Progress Notes (Signed)
    Subjective: 3 Days Post-Op Procedure(s) (LRB): UNICOMPARTMENTAL RIGHT KNEE LATERAL (Right) Patient reports pain as 3 on 0-10 scale.   Denies CP or SOB.  Voiding without difficulty. Positive flatus. Objective: Vital signs in last 24 hours: Temp:  [97.5 F (36.4 C)-98.2 F (36.8 C)] 98.2 F (36.8 C) (09/29 0640) Pulse Rate:  [76-92] 92 (09/29 0640) Resp:  [16] 16 (09/29 0640) BP: (124-128)/(72-93) 125/80 (09/29 0640) SpO2:  [98 %-100 %] 98 % (09/29 0640)  Intake/Output from previous day: 09/28 0701 - 09/29 0700 In: 600 [P.O.:600] Out: 0  Intake/Output this shift: No intake/output data recorded.  Labs: Recent Labs    10/31/17 0447 11/01/17 0343  HGB 12.5 12.6   Recent Labs    10/31/17 0447 11/01/17 0343  WBC 12.3* 11.7*  RBC 4.25 4.35  HCT 39.0 39.7  PLT 252 283   Recent Labs    10/31/17 0447 11/01/17 0343  NA 135 135  K 4.2 4.4  CL 102 100  CO2 20* 25  BUN 9 10  CREATININE 0.71 0.67  GLUCOSE 185* 166*  CALCIUM 8.0* 8.7*   No results for input(s): LABPT, INR in the last 72 hours.  Physical Exam: Neurologically intact ABD soft Sensation intact distally Dorsiflexion/Plantar flexion intact Incision: no drainage Compartment soft Body mass index is 40.72 kg/m.   Assessment/Plan: 3 Days Post-Op Procedure(s) (LRB): UNICOMPARTMENTAL RIGHT KNEE LATERAL (Right) Advance diet Up with therapy  Hopeful to [place] after cleared by PT/OT Home Health ordered Meds on chart  [place]on Verne, Darla Lesches for Dr. Melina Schools [facility] [phone] 11/02/2017, 7:38 AM

## 2017-11-02 NOTE — Progress Notes (Signed)
Physical Therapy Treatment Patient Details Name: Wanda Oneill MRN: [id number] DOB: February 22, 1980 Today's Date: 11/02/2017    History of Present Illness Pt is a 37 YO female s/p R UKR on 10/30/17. PMH includes down syndrome, DMII, GERD, headaches, IBS, RLS, multiple hernia repairs.     PT Comments    Pt works well with therapy, but fatigues quickly.  Initiated stair training, but will return later today to practice again.    Follow Up Recommendations  Outpatient PT;Follow surgeon's recommendation for DC plan and follow-up therapies     Equipment Recommendations  None recommended by PT    Recommendations for Other Services       Precautions / Restrictions Precautions Precautions: Fall;Knee Precaution Comments: used KI for func mobility Required Braces or Orthoses: Knee Immobilizer - Right Restrictions Weight Bearing Restrictions: No Other Position/Activity Restrictions: WBAT     Mobility  Bed Mobility Overal bed mobility: Needs Assistance Bed Mobility: Supine to Sit     Supine to sit: Min assist Sit to supine: Min assist   General bed mobility comments: min A for support of RLE with leg lifter  Transfers Overall transfer level: Needs assistance Equipment used: Rolling walker (2 wheeled) Transfers: Sit to/from Stand Sit to Stand: Min assist Stand pivot transfers: Min assist       General transfer comment: extra time to push into standing, needing cues for appropriate hand placement  Ambulation/Gait Ambulation/Gait assistance: Min guard Gait Distance (Feet): 20 Feet Assistive device: Rolling walker (2 wheeled) Gait Pattern/deviations: Step-through pattern     General Gait Details: fatigued after toileting   Stairs Stairs: Yes Stairs assistance: Mod assist Stair Management: One rail Right;Sideways;Step to pattern Number of Stairs: 1 General stair comments: Pt ascended 1 step sideways with flexed posture and great effort.  She was fatigued from toileting  and ambulation.   Wheelchair Mobility    Modified Rankin (Stroke Patients Only)       Balance Overall balance assessment: Needs assistance Sitting-balance support: Feet supported;No upper extremity supported Sitting balance-Leahy Scale: Good     Standing balance support: During functional activity;Bilateral upper extremity supported Standing balance-Leahy Scale: Fair                              Cognition Arousal/Alertness: Awake/alert Behavior During Therapy: WFL for tasks assessed/performed Overall Cognitive Status: Within Functional Limits for tasks assessed                                 General Comments: PMH of down syndrome      Exercises Total Joint Exercises Ankle Circles/Pumps: AROM;Both;10 reps;Supine Quad Sets: AAROM;Both;10 reps;Supine Short Arc Quad: AAROM;Right;Supine;10 reps Heel Slides: AAROM;Right;10 reps;Supine Hip ABduction/ADduction: AAROM;Right;10 reps    General Comments        Pertinent Vitals/Pain Pain Assessment: Faces Pain Score: 7  Faces Pain Scale: Hurts little more Pain Location: R knee  Pain Descriptors / Indicators: Sore Pain Intervention(s): Limited activity within patient's tolerance;Monitored during session;Repositioned    Home Living                      Prior Function            PT Goals (current goals can now be found in the care plan section) Acute Rehab PT Goals Patient Stated Goal: go home and do things for myself PT Goal Formulation: With patient/family  Time For Goal Achievement: 11/06/17 Potential to Achieve Goals: Good Progress towards PT goals: Progressing toward goals    Frequency    7X/week      PT Plan Current plan remains appropriate    Co-evaluation PT/OT/SLP Co-Evaluation/Treatment: Yes Reason for Co-Treatment: For patient/therapist safety(for a portion of session) PT goals addressed during session: Mobility/safety with mobility;Strengthening/ROM         AM-PAC PT "6 Clicks" Daily Activity  Outcome Measure  Difficulty turning over in bed (including adjusting bedclothes, sheets and blankets)?: A Lot Difficulty moving from lying on back to sitting on the side of the bed? : Unable Difficulty sitting down on and standing up from a chair with arms (e.g., wheelchair, bedside commode, etc,.)?: A Lot Help needed moving to and from a bed to chair (including a wheelchair)?: A Little Help needed walking in hospital room?: A Lot Help needed climbing 3-5 steps with a railing? : Total 6 Click Score: 11    End of Session Equipment Utilized During Treatment: Gait belt;Right knee immobilizer Activity Tolerance: Patient tolerated treatment well Patient left: in chair;Other (comment)(OT) Nurse Communication: Mobility status PT Visit Diagnosis: Difficulty in walking, not elsewhere classified (R26.2);Other abnormalities of gait and mobility (R26.89)     Time: 5110-2111 PT Time Calculation (min) (ACUTE ONLY): 48 min  Charges:  $Gait Training: 8-22 mins $Therapeutic Exercise: 8-22 mins                     Wanda Oneill, [place] Pager [phone] 11/02/2017    [name] 11/02/2017, 10:01 AM

## 2017-11-02 NOTE — Care Management Note (Signed)
Case Management Note  Patient Details  Name: Wanda Oneill MRN: [id number] Date of Birth: 27-Jul-1980  Subjective/Objective:      Pt from home with parents for knee surgery.  Spoke with patient and mother via phone.  Pt has RW, 3n1, and all other necessary DME at home.                Action/Plan: Offered choice for [facility] agency.  Mother chooses [facility].  Tiffany with [facility] accepted referral.    Expected Discharge Date:  11/02/17               Expected Discharge Plan:  [place]  In-House Referral:  NA  Discharge planning Services  CM Consult  Post Acute Care Choice:  Home Health Choice offered to:  Patient  DME Arranged:  N/A DME Agency:  NA  HH Arranged:  PT, OT HH Agency:  [facility] (formerly [employer])  Status of Service:  Completed, signed off  If discussed at [employer], dates discussed:    Additional Comments:  Claudie Leach, RN 11/02/2017, 5:32 PM

## 2017-11-05 ENCOUNTER — Ambulatory Visit: Payer: Medicare Other | Admitting: Physical Therapy

## 2017-11-13 ENCOUNTER — Encounter: Payer: Self-pay | Admitting: Physical Therapy

## 2017-11-13 ENCOUNTER — Ambulatory Visit: Payer: Medicare Other | Attending: Orthopedic Surgery | Admitting: Physical Therapy

## 2017-11-13 DIAGNOSIS — M25661 Stiffness of right knee, not elsewhere classified: Secondary | ICD-10-CM | POA: Insufficient documentation

## 2017-11-13 DIAGNOSIS — R600 Localized edema: Secondary | ICD-10-CM | POA: Diagnosis present

## 2017-11-13 DIAGNOSIS — M25561 Pain in right knee: Secondary | ICD-10-CM | POA: Diagnosis present

## 2017-11-13 DIAGNOSIS — R262 Difficulty in walking, not elsewhere classified: Secondary | ICD-10-CM | POA: Insufficient documentation

## 2017-11-13 NOTE — Therapy (Signed)
[address] [place], [place], [address] Phone: [phone]   Fax:  [phone]  Physical Therapy Evaluation  Patient Details  Name: Wanda Oneill MRN: [id number] Date of Birth: May 06, 1980 Referring Provider (PT): Cathe Mons Date: 11/13/2017  PT End of Session - 11/13/17 1011    Visit Number  1    Date for PT Re-Evaluation  01/13/18    PT Start Time  0930    PT Stop Time  1022    PT Time Calculation (min)  52 min    Activity Tolerance  Patient tolerated treatment well    Behavior During Therapy  [facility] for tasks assessed/performed       Past Medical History:  Diagnosis Date  . Diabetes mellitus    type II  . Down syndrome   . Family history of adverse reaction to anesthesia    mom has had n/v  . Gastritis   . GERD (gastroesophageal reflux disease)   . Headache   . Hepatic steatosis   . Hidradenitis   . Hypothyroidism   . Irritable bowel syndrome   . Periumbilical hernia   . Pneumonia   . Restless legs   . Tendonitis    chronic in left foot  . Thyroid disease    hypothyroidism    Past Surgical History:  Procedure Laterality Date  . AXILLARY HIDRADENITIS EXCISION Bilateral   . CHOLECYSTECTOMY    . HERNIA REPAIR     multiple  . INCISIONAL HERNIA REPAIR  05/30/2015   LAPROSCOPIC  . INCISIONAL HERNIA REPAIR N/A 05/30/2015   Procedure: LAPAROSCOPIC INCISIONAL HERNIA;  Surgeon: Rolm Bookbinder, MD;  Location: [place];  Service: General;  Laterality: N/A;  . INSERTION OF MESH N/A 05/30/2015   Procedure: INSERTION OF MESH;  Surgeon: Rolm Bookbinder, MD;  Location: [place];  Service: General;  Laterality: N/A;  . KNEE ARTHROSCOPY     right knee  . LAPAROSCOPY N/A 05/30/2015   Procedure: LAPAROSCOPY DIAGNOSTIC;  Surgeon: Rolm Bookbinder, MD;  Location: [place];  Service: General;  Laterality: N/A;  . PARTIAL KNEE ARTHROPLASTY Right 10/30/2017   Procedure: UNICOMPARTMENTAL RIGHT KNEE  LATERAL;  Surgeon: Paralee Cancel, MD;  [facility];  Service: Orthopedics;  Laterality: Right;  90 mins  . TONSILLECTOMY     adenoids    There were no vitals filed for this visit.   Subjective Assessment - 11/13/17 0934    Subjective  Patient with right knee pain for over a year, had a knee scope last year and some injections that did not help.  She underwent a lateral 1/3 right knee replacement on 10/30/17.  Had home PT 5 visits.  She reports that she is really ready to get moving    Limitations  Standing;Walking;House hold activities    Patient Stated Goals  have more motions, no pain and walk without device    Currently in Pain?  Yes    Pain Score  5     Pain Location  Knee    Pain Orientation  Right    Pain Descriptors / Indicators  Aching    Pain Type  Acute pain;Surgical pain    Pain Onset  1 to 4 weeks ago    Pain Frequency  Constant    Aggravating Factors   bending, walking, as the day goes on pain up to 8/10    Pain Relieving Factors  pain medication, rest and ice, pain can come down  to 3/10    Effect of Pain on Daily Activities  limits walking         [facility] PT Assessment - 11/13/17 0001      Assessment   Medical Diagnosis  s/p right partial knee replacement    Referring Provider (PT)  Alvan Dame    Onset Date/Surgical Date  11/02/17      Precautions   Precautions  None      Balance Screen   Has the patient fallen in the past 6 months  Yes    How many times?  1    Has the patient had a decrease in activity level because of a fear of falling?   No    Is the patient reluctant to leave their home because of a fear of falling?   No      Home Environment   Additional Comments  a few steps, has a ramp, does cooking and cleaning      Prior Function   Level of Independence  Independent    Vocation  Student    Vocation Requirements  school 3x/week      Observation/Other Assessments-Edema    Edema  Circumferential      Circumferential Edema   Circumferential -  Right  48.5 cm    Circumferential - Left   43.5 cm      ROM / Strength   AROM / PROM / Strength  AROM;PROM;Strength      AROM   Overall AROM Comments  painful AROM    AROM Assessment Site  Knee    Right/Left Knee  Right    Right Knee Extension  25    Right Knee Flexion  73      PROM   Overall PROM Comments  very painful PROM    PROM Assessment Site  Knee    Right/Left Knee  Right    Right Knee Extension  15    Right Knee Flexion  84      Strength   Overall Strength Comments  3+/5 with pain      Palpation   Palpation comment  mild warmth, scar is puckering      Ambulation/Gait   Gait Comments  uses a FWW, slow, step to gait, stiff right knee      Standardized Balance Assessment   Standardized Balance Assessment  Timed Up and Go Test      Timed Up and Go Test   Normal TUG (seconds)  39                Objective measurements completed on examination: See above findings.      [place] Adult PT Treatment/Exercise - 11/13/17 0001      Exercises   Exercises  Knee/Hip      Knee/Hip Exercises: Aerobic   Nustep  level 4 x 6 minutes      Modalities   Modalities  Vasopneumatic      Vasopneumatic   Number Minutes Vasopneumatic   15 minutes    Vasopnuematic Location   Knee    Vasopneumatic Pressure  Low    Vasopneumatic Temperature   34             PT Education - 11/13/17 1010    Education Details  QS, SAQ, Heel slides, stnading knee flexion    Person(s) Educated  Patient;Parent(s)    Methods  Explanation;Demonstration;Handout    Comprehension  Verbalized understanding       PT Short Term Goals - 11/13/17 1015  PT SHORT TERM GOAL #1   Title  independent with initial HEP    Time  2    Period  Weeks    Status  New        PT Long Term Goals - 11/13/17 1015      PT LONG TERM GOAL #1   Title  understand RICE    Time  8    Period  Weeks    Status  New      PT LONG TERM GOAL #2   Title  dcrease pain 50%    Time  8    Period  Weeks     Status  New      PT LONG TERM GOAL #3   Title  increase AROM of knee flexion to 10 to 115 degrees flexion    Time  8    Period  Weeks    Status  New      PT LONG TERM GOAL #4   Title  go down stairs step over step    Time  8    Period  Weeks    Status  New      PT LONG TERM GOAL #5   Title  walk with SPC or less device for home and community distances    Time  8    Period  Weeks    Status  New             Plan - 11/13/17 1011    Clinical Impression Statement  Patient underwent right lateral 1/3 knee replacement on 10/30/17.  She had some home PT.  She is limited in her AROM to 25-73 degrees flexion, PROM 13-84 degrees flexion, TUG was 39 seconds, uses a FWW, slow step to gait, stiff leg, has significant edema    Clinical Presentation  Evolving    Clinical Decision Making  Low    Rehab Potential  Good    PT Frequency  2x / week    PT Duration  8 weeks    PT Treatment/Interventions  ADLs/Self Care Home Management;Cryotherapy;Electrical Stimulation;Gait training;Balance training;Therapeutic exercise;Therapeutic activities;Functional mobility training;Stair training;Patient/family education;Manual techniques;Vasopneumatic Device    PT Next Visit Plan  work on function, gait    Consulted and Agree with Plan of Care  Patient       Patient will benefit from skilled therapeutic intervention in order to improve the following deficits and impairments:  Abnormal gait, Decreased range of motion, Difficulty walking, Increased muscle spasms, Decreased activity tolerance, Pain, Impaired flexibility, Decreased scar mobility, Decreased mobility, Decreased strength, Increased edema  Visit Diagnosis: Acute pain of right knee - Plan: PT plan of care cert/re-cert  Localized edema - Plan: PT plan of care cert/re-cert  Difficulty in walking, not elsewhere classified - Plan: PT plan of care cert/re-cert  Stiffness of right knee, not elsewhere classified - Plan: PT plan of care  cert/re-cert     Problem List Patient Active Problem List   Diagnosis Date Noted  . Morbid obesity ([place]) 10/31/2017  . S/P right UKR, lateral 10/30/2017  . Incarcerated epigastric hernia 05/30/2015  . Left foot pain 08/09/2014  . Chronic diarrhea 07/27/2014  . Gout 08/10/2012  . Acid reflux 01/22/2012  . Down syndrome 11/11/2011  . Hernia of abdominal wall 10/21/2011  . Psoriasis 10/21/2011  . Type 2 diabetes mellitus, uncontrolled ([place]) 04/12/2011  . Hypothyroid 04/12/2011  . Gait abnormality 06/22/2010  . Tibial tendinitis, posterior 06/22/2010    Sumner Boast., PT 11/13/2017, 10:21 AM  [address] [place], [place], [address] Phone: [phone]   Fax:  [phone]  Name: Wanda Oneill MRN: [id number] Date of Birth: 09/26/1980

## 2017-11-18 ENCOUNTER — Ambulatory Visit: Payer: Medicare Other | Admitting: Physical Therapy

## 2017-11-18 DIAGNOSIS — M25561 Pain in right knee: Secondary | ICD-10-CM

## 2017-11-18 DIAGNOSIS — R600 Localized edema: Secondary | ICD-10-CM

## 2017-11-18 DIAGNOSIS — M25661 Stiffness of right knee, not elsewhere classified: Secondary | ICD-10-CM

## 2017-11-18 DIAGNOSIS — R262 Difficulty in walking, not elsewhere classified: Secondary | ICD-10-CM

## 2017-11-18 NOTE — Therapy (Signed)
[address] [place], [place], [address] Phone: [phone]   Fax:  [phone]  Physical Therapy Treatment  Patient Details  Name: Wanda Oneill MRN: [id number] Date of Birth: 22-Jan-1981 Referring Provider (PT): Cathe Mons Date: 11/18/2017  PT End of Session - 11/18/17 1558    Visit Number  2    Date for PT Re-Evaluation  01/13/18    PT Start Time  1525    PT Stop Time  1620    PT Time Calculation (min)  55 min       Past Medical History:  Diagnosis Date  . Diabetes mellitus    type II  . Down syndrome   . Family history of adverse reaction to anesthesia    mom has had n/v  . Gastritis   . GERD (gastroesophageal reflux disease)   . Headache   . Hepatic steatosis   . Hidradenitis   . Hypothyroidism   . Irritable bowel syndrome   . Periumbilical hernia   . Pneumonia   . Restless legs   . Tendonitis    chronic in left foot  . Thyroid disease    hypothyroidism    Past Surgical History:  Procedure Laterality Date  . AXILLARY HIDRADENITIS EXCISION Bilateral   . CHOLECYSTECTOMY    . HERNIA REPAIR     multiple  . INCISIONAL HERNIA REPAIR  05/30/2015   LAPROSCOPIC  . INCISIONAL HERNIA REPAIR N/A 05/30/2015   Procedure: LAPAROSCOPIC INCISIONAL HERNIA;  Surgeon: Rolm Bookbinder, MD;  Location: [place];  Service: General;  Laterality: N/A;  . INSERTION OF MESH N/A 05/30/2015   Procedure: INSERTION OF MESH;  Surgeon: Rolm Bookbinder, MD;  Location: [place];  Service: General;  Laterality: N/A;  . KNEE ARTHROSCOPY     right knee  . LAPAROSCOPY N/A 05/30/2015   Procedure: LAPAROSCOPY DIAGNOSTIC;  Surgeon: Rolm Bookbinder, MD;  Location: [place];  Service: General;  Laterality: N/A;  . PARTIAL KNEE ARTHROPLASTY Right 10/30/2017   Procedure: UNICOMPARTMENTAL RIGHT KNEE LATERAL;  Surgeon: Paralee Cancel, MD;  [facility];  Service: Orthopedics;  Laterality: Right;  90 mins  . TONSILLECTOMY      adenoids    There were no vitals filed for this visit.  Subjective Assessment - 11/18/17 1526    Subjective  amb in without AD, antalgic gait    Currently in Pain?  Yes    Pain Score  4     Pain Location  Knee    Pain Orientation  Right                       [facility] Adult PT Treatment/Exercise - 11/18/17 0001      Exercises   Exercises  Knee/Hip      Knee/Hip Exercises: Aerobic   Recumbent Bike  5 min   partial rev   Nustep  level 4 x 6 minutes   LE only     Knee/Hip Exercises: Machines for Strengthening   Cybex Leg Press  30# 2 sets 10      Knee/Hip Exercises: Standing   Lateral Step Up  Right;Hand Hold: 1;Step Height: 6"   step tap   Forward Step Up  Right;15 reps;Hand Hold: 1;Step Height: 6"   step tap     Knee/Hip Exercises: Seated   Long Arc Quad  Strengthening;Right;2 sets;10 reps   hold 3 sec at [employer]   Hamstring Curl  Strengthening;Right;2 sets;10  reps   yellow tband     Modalities   Modalities  Vasopneumatic      Vasopneumatic   Number Minutes Vasopneumatic   15 minutes    Vasopnuematic Location   Knee    Vasopneumatic Pressure  Low    Vasopneumatic Temperature   34      Manual Therapy   Manual Therapy  Passive ROM    Passive ROM  gentle PROM flex and ext, limited tolerance               PT Short Term Goals - 11/13/17 1015      PT SHORT TERM GOAL #1   Title  independent with initial HEP    Time  2    Period  Weeks    Status  New        PT Long Term Goals - 11/13/17 1015      PT LONG TERM GOAL #1   Title  understand RICE    Time  8    Period  Weeks    Status  New      PT LONG TERM GOAL #2   Title  dcrease pain 50%    Time  8    Period  Weeks    Status  New      PT LONG TERM GOAL #3   Title  increase AROM of knee flexion to 10 to 115 degrees flexion    Time  8    Period  Weeks    Status  New      PT LONG TERM GOAL #4   Title  go down stairs step over step    Time  8    Period  Weeks    Status  New       PT LONG TERM GOAL #5   Title  walk with SPC or less device for home and community distances    Time  8    Period  Weeks    Status  New            Plan - 11/18/17 1558    Clinical Impression Statement  pt concerned with getting leg into tub, addressed this but only able to if goes with Left leg first. pt needed much encouragement and cuing with ex and limited tolerance to PROM    PT Treatment/Interventions  ADLs/Self Care Home Management;Cryotherapy;Electrical Stimulation;Gait training;Balance training;Therapeutic exercise;Therapeutic activities;Functional mobility training;Stair training;Patient/family education;Manual techniques;Vasopneumatic Device    PT Next Visit Plan  work on function, gait and ROM       Patient will benefit from skilled therapeutic intervention in order to improve the following deficits and impairments:  Abnormal gait, Decreased range of motion, Difficulty walking, Increased muscle spasms, Decreased activity tolerance, Pain, Impaired flexibility, Decreased scar mobility, Decreased mobility, Decreased strength, Increased edema  Visit Diagnosis: Acute pain of right knee  Localized edema  Difficulty in walking, not elsewhere classified  Stiffness of right knee, not elsewhere classified     Problem List Patient Active Problem List   Diagnosis Date Noted  . Morbid obesity ([place]) 10/31/2017  . S/P right UKR, lateral 10/30/2017  . Incarcerated epigastric hernia 05/30/2015  . Left foot pain 08/09/2014  . Chronic diarrhea 07/27/2014  . Gout 08/10/2012  . Acid reflux 01/22/2012  . Down syndrome 11/11/2011  . Hernia of abdominal wall 10/21/2011  . Psoriasis 10/21/2011  . Type 2 diabetes mellitus, uncontrolled ([place]) 04/12/2011  . Hypothyroid 04/12/2011  . Gait abnormality 06/22/2010  . Tibial  tendinitis, posterior 06/22/2010    PAYSEUR,ANGIE PTA 11/18/2017, 4:00 PM  [address] [place], [place], [address] Phone: [phone]   Fax:  [phone]  Name: Wanda Oneill MRN: [id number] Date of Birth: January 16, 1981

## 2017-11-20 ENCOUNTER — Ambulatory Visit: Payer: Medicare Other | Admitting: Physical Therapy

## 2017-11-20 DIAGNOSIS — R262 Difficulty in walking, not elsewhere classified: Secondary | ICD-10-CM

## 2017-11-20 DIAGNOSIS — M25561 Pain in right knee: Secondary | ICD-10-CM

## 2017-11-20 DIAGNOSIS — R600 Localized edema: Secondary | ICD-10-CM

## 2017-11-20 DIAGNOSIS — M25661 Stiffness of right knee, not elsewhere classified: Secondary | ICD-10-CM

## 2017-11-20 NOTE — Therapy (Signed)
[address] [place], [place], [address] Phone: [phone]   Fax:  [phone]  Physical Therapy Treatment  Patient Details  Name: Wanda Oneill MRN: [id number] Date of Birth: 1980-08-11 Referring Provider (PT): Cathe Mons Date: 11/20/2017  PT End of Session - 11/20/17 1623    Visit Number  3    Date for PT Re-Evaluation  01/13/18    PT Start Time  1275    PT Stop Time  1635    PT Time Calculation (min)  65 min    Activity Tolerance  Patient tolerated treatment well    Behavior During Therapy  [facility] for tasks assessed/performed       Past Medical History:  Diagnosis Date  . Diabetes mellitus    type II  . Down syndrome   . Family history of adverse reaction to anesthesia    mom has had n/v  . Gastritis   . GERD (gastroesophageal reflux disease)   . Headache   . Hepatic steatosis   . Hidradenitis   . Hypothyroidism   . Irritable bowel syndrome   . Periumbilical hernia   . Pneumonia   . Restless legs   . Tendonitis    chronic in left foot  . Thyroid disease    hypothyroidism    Past Surgical History:  Procedure Laterality Date  . AXILLARY HIDRADENITIS EXCISION Bilateral   . CHOLECYSTECTOMY    . HERNIA REPAIR     multiple  . INCISIONAL HERNIA REPAIR  05/30/2015   LAPROSCOPIC  . INCISIONAL HERNIA REPAIR N/A 05/30/2015   Procedure: LAPAROSCOPIC INCISIONAL HERNIA;  Surgeon: Rolm Bookbinder, MD;  Location: [place];  Service: General;  Laterality: N/A;  . INSERTION OF MESH N/A 05/30/2015   Procedure: INSERTION OF MESH;  Surgeon: Rolm Bookbinder, MD;  Location: [place];  Service: General;  Laterality: N/A;  . KNEE ARTHROSCOPY     right knee  . LAPAROSCOPY N/A 05/30/2015   Procedure: LAPAROSCOPY DIAGNOSTIC;  Surgeon: Rolm Bookbinder, MD;  Location: [place];  Service: General;  Laterality: N/A;  . PARTIAL KNEE ARTHROPLASTY Right 10/30/2017   Procedure: UNICOMPARTMENTAL RIGHT KNEE LATERAL;   Surgeon: Paralee Cancel, MD;  [facility];  Service: Orthopedics;  Laterality: Right;  90 mins  . TONSILLECTOMY     adenoids    There were no vitals filed for this visit.  Subjective Assessment - 11/20/17 1621    Subjective  Pt relays she has been walking without walker or assistive device with good balance.     Currently in Pain?  Yes    Pain Score  4     Pain Location  Knee    Pain Orientation  Right    Pain Descriptors / Indicators  Aching;Tightness         [facility] PT Assessment - 11/20/17 0001      Assessment   Medical Diagnosis  s/p right partial knee replacement    Referring Provider (PT)  Alvan Dame    Onset Date/Surgical Date  11/02/17    Next MD Visit  11/27/17      AROM   Right Knee Flexion  93      Standardized Balance Assessment   Standardized Balance Assessment  Berg Balance Test      Berg Balance Test   Sit to Stand  Able to stand without using hands and stabilize independently    Standing Unsupported  Able to stand safely 2 minutes  Sitting with Back Unsupported but Feet Supported on Floor or Stool  Able to sit safely and securely 2 minutes    Stand to Sit  Sits safely with minimal use of hands    Transfers  Able to transfer safely, minor use of hands    Standing Unsupported with Eyes Closed  Able to stand 10 seconds safely    Standing Ubsupported with Feet Together  Able to place feet together independently and stand 1 minute safely    From Standing, Reach Forward with Outstretched Arm  Can reach confidently >25 cm (10")    From Standing Position, Pick up Object from Floor  Able to pick up shoe safely and easily    From Standing Position, Turn to Look Behind Over each Shoulder  Looks behind from both sides and weight shifts well    Turn 360 Degrees  Able to turn 360 degrees safely but slowly    Standing Unsupported, Alternately Place Feet on Step/Stool  Able to stand independently and complete 8 steps >20 seconds    Standing Unsupported, One Foot in Front   Able to plae foot ahead of the other independently and hold 30 seconds    Standing on One Leg  Tries to lift leg/unable to hold 3 seconds but remains standing independently    Total Score  49                   [facility] Adult PT Treatment/Exercise - 11/20/17 0001      Exercises   Exercises  Knee/Hip      Knee/Hip Exercises: Aerobic   Nustep  level 4 x 6 minutes      Knee/Hip Exercises: Standing   Forward Step Up  Right;15 reps;Hand Hold: 1;Step Height: 6"      Knee/Hip Exercises: Seated   Long Arc Quad  Strengthening;Right;2 sets;10 reps   1 lb   Long Arc Quad Weight  1 lbs.    Hamstring Curl  Strengthening;Right;2 sets;10 reps   yellow     Knee/Hip Exercises: Supine   Heel Slides  AAROM;15 reps   5 sec hold with strap     Modalities   Modalities  Vasopneumatic      Vasopneumatic   Number Minutes Vasopneumatic   15 minutes    Vasopnuematic Location   Knee    Vasopneumatic Pressure  Low    Vasopneumatic Temperature   34             PT Education - 11/20/17 1622    Education Details  BERG balance results which suggest she does not need assistive device    Person(s) Educated  Patient;Parent(s)   mom   Methods  Explanation    Comprehension  Verbalized understanding       PT Short Term Goals - 11/13/17 1015      PT SHORT TERM GOAL #1   Title  independent with initial HEP    Time  2    Period  Weeks    Status  New        PT Long Term Goals - 11/13/17 1015      PT LONG TERM GOAL #1   Title  understand RICE    Time  8    Period  Weeks    Status  New      PT LONG TERM GOAL #2   Title  dcrease pain 50%    Time  8    Period  Weeks    Status  New      PT LONG TERM GOAL #3   Title  increase AROM of knee flexion to 10 to 115 degrees flexion    Time  8    Period  Weeks    Status  New      PT LONG TERM GOAL #4   Title  go down stairs step over step    Time  8    Period  Weeks    Status  New      PT LONG TERM GOAL #5   Title  walk with  SPC or less device for home and community distances    Time  8    Period  Weeks    Status  New            Plan - 11/20/17 1624    Clinical Impression Statement  Pt has made good progress thus far with PT. She had increased knee flexion ROM, see updated measurements. She was stretched with AAROM heel slides today oppossed to PROM as she has better tolerance to this. Her balance was assessed today with BERG balance assessment where she scored 49/52 which suggest she does not need walker or other assistive device for walking. Scores less than 46/56 suggest she is at an increased risk for falls. She is not at a falls risk at this time and PT feels she does not need RW for community ambulation and at school. She does still have limp on Rt leg but this is more due to pain, and ROM versus balance deficits.     Rehab Potential  Good    PT Frequency  2x / week    PT Duration  8 weeks    PT Treatment/Interventions  ADLs/Self Care Home Management;Cryotherapy;Electrical Stimulation;Gait training;Balance training;Therapeutic exercise;Therapeutic activities;Functional mobility training;Stair training;Patient/family education;Manual techniques;Vasopneumatic Device    PT Next Visit Plan  work on function, gait and ROM    Consulted and Agree with Plan of Care  Patient       Patient will benefit from skilled therapeutic intervention in order to improve the following deficits and impairments:  Abnormal gait, Decreased range of motion, Difficulty walking, Increased muscle spasms, Decreased activity tolerance, Pain, Impaired flexibility, Decreased scar mobility, Decreased mobility, Decreased strength, Increased edema  Visit Diagnosis: Acute pain of right knee  Localized edema  Difficulty in walking, not elsewhere classified  Stiffness of right knee, not elsewhere classified     Problem List Patient Active Problem List   Diagnosis Date Noted  . Morbid obesity ([place]) 10/31/2017  . S/P right UKR,  lateral 10/30/2017  . Incarcerated epigastric hernia 05/30/2015  . Left foot pain 08/09/2014  . Chronic diarrhea 07/27/2014  . Gout 08/10/2012  . Acid reflux 01/22/2012  . Down syndrome 11/11/2011  . Hernia of abdominal wall 10/21/2011  . Psoriasis 10/21/2011  . Type 2 diabetes mellitus, uncontrolled ([place]) 04/12/2011  . Hypothyroid 04/12/2011  . Gait abnormality 06/22/2010  . Tibial tendinitis, posterior 06/22/2010    Debbe Odea, PT, DPT 11/20/2017, 4:31 PM  [address] [place], [place], [address] Phone: [phone]   Fax:  [phone]  Name: SUHANA WILNER MRN: [id number] Date of Birth: 06-Jan-1981

## 2017-11-25 ENCOUNTER — Ambulatory Visit: Payer: Medicare Other | Admitting: Physical Therapy

## 2017-11-25 ENCOUNTER — Encounter: Payer: Self-pay | Admitting: Physical Therapy

## 2017-11-25 DIAGNOSIS — M25661 Stiffness of right knee, not elsewhere classified: Secondary | ICD-10-CM

## 2017-11-25 DIAGNOSIS — R262 Difficulty in walking, not elsewhere classified: Secondary | ICD-10-CM

## 2017-11-25 DIAGNOSIS — R600 Localized edema: Secondary | ICD-10-CM

## 2017-11-25 DIAGNOSIS — M25561 Pain in right knee: Secondary | ICD-10-CM | POA: Diagnosis not present

## 2017-11-25 NOTE — Therapy (Signed)
[place] [address] [place], [place], [address] Phone: [phone]   Fax:  [phone]  Physical Therapy Treatment  Patient Details  Name: Wanda Oneill MRN: [id number] Date of Birth: 1981/01/27 Referring Provider (PT): Cathe Mons Date: 11/25/2017  PT End of Session - 11/25/17 1645    Visit Number  4    Date for PT Re-Evaluation  01/13/18    PT Start Time  1602    PT Stop Time  1657    PT Time Calculation (min)  55 min    Activity Tolerance  Patient tolerated treatment well    Behavior During Therapy  [facility] for tasks assessed/performed       Past Medical History:  Diagnosis Date  . Diabetes mellitus    type II  . Down syndrome   . Family history of adverse reaction to anesthesia    mom has had n/v  . Gastritis   . GERD (gastroesophageal reflux disease)   . Headache   . Hepatic steatosis   . Hidradenitis   . Hypothyroidism   . Irritable bowel syndrome   . Periumbilical hernia   . Pneumonia   . Restless legs   . Tendonitis    chronic in left foot  . Thyroid disease    hypothyroidism    Past Surgical History:  Procedure Laterality Date  . AXILLARY HIDRADENITIS EXCISION Bilateral   . CHOLECYSTECTOMY    . HERNIA REPAIR     multiple  . INCISIONAL HERNIA REPAIR  05/30/2015   LAPROSCOPIC  . INCISIONAL HERNIA REPAIR N/A 05/30/2015   Procedure: LAPAROSCOPIC INCISIONAL HERNIA;  Surgeon: Rolm Bookbinder, MD;  Location: [place];  Service: General;  Laterality: N/A;  . INSERTION OF MESH N/A 05/30/2015   Procedure: INSERTION OF MESH;  Surgeon: Rolm Bookbinder, MD;  Location: [place];  Service: General;  Laterality: N/A;  . KNEE ARTHROSCOPY     right knee  . LAPAROSCOPY N/A 05/30/2015   Procedure: LAPAROSCOPY DIAGNOSTIC;  Surgeon: Rolm Bookbinder, MD;  Location: [place];  Service: General;  Laterality: N/A;  . PARTIAL KNEE ARTHROPLASTY Right 10/30/2017   Procedure: UNICOMPARTMENTAL RIGHT KNEE LATERAL;   Surgeon: Paralee Cancel, MD;  [facility];  Service: Orthopedics;  Laterality: Right;  90 mins  . TONSILLECTOMY     adenoids    There were no vitals filed for this visit.  Subjective Assessment - 11/25/17 1639    Subjective  Patient reports that she gets tired easily, reports some calf pain and cramps    Currently in Pain?  Yes    Pain Score  5     Pain Location  Knee    Pain Orientation  Right    Aggravating Factors   walking    Pain Relieving Factors  rest, "the ice machine"                       [facility] Adult PT Treatment/Exercise - 11/25/17 0001      Ambulation/Gait   Gait Comments  gait with patient around the building no device, very slow gait 1 2 rest breaks, significant limp on the right      Exercises   Exercises  Knee/Hip      Knee/Hip Exercises: Stretches   Gastroc Stretch  2 reps;20 seconds      Knee/Hip Exercises: Aerobic   Recumbent Bike  5 min, partial revolutions      Knee/Hip Exercises: Machines for  Strengthening   Cybex Leg Press  no weight working on ROM      Knee/Hip Exercises: Standing   Other Standing Knee Exercises  8" toe clears      Knee/Hip Exercises: Seated   Hamstring Curl  Left;3 sets;10 reps    Hamstring Limitations  red tband      Knee/Hip Exercises: Supine   Short Arc Quad Sets  Right;3 sets;10 reps    Other Supine Knee/Hip Exercises  feet on ball K2C, small bridges very difficult for her, needs to do sets of 3, then worked on her very weak core with isometric abdominals with ball pushing with some PT resistance      Modalities   Modalities  Vasopneumatic      Vasopneumatic   Number Minutes Vasopneumatic   15 minutes    Vasopnuematic Location   Knee    Vasopneumatic Pressure  Low    Vasopneumatic Temperature   38               PT Short Term Goals - 11/25/17 1648      PT SHORT TERM GOAL #1   Title  independent with initial HEP    Status  Partially Met        PT Long Term Goals - 11/25/17 1648       PT LONG TERM GOAL #1   Title  understand [place]    Status  Partially Met            Plan - 11/25/17 1646    Clinical Impression Statement  Patient does not like to push the knee, she is hesitant with any exercise that may bend the knee or use weights, she does not like pain so does not like the stretches.  She does have tight calves, she is very weak in teh core but able to fire them with some assist.  She needed two rests with gait around the building    PT Next Visit Plan  continue to push her function,  and if she allows the ROM    Consulted and Agree with Plan of Care  Patient       Patient will benefit from skilled therapeutic intervention in order to improve the following deficits and impairments:  Abnormal gait, Decreased range of motion, Difficulty walking, Increased muscle spasms, Decreased activity tolerance, Pain, Impaired flexibility, Decreased scar mobility, Decreased mobility, Decreased strength, Increased edema  Visit Diagnosis: Acute pain of right knee  Localized edema  Difficulty in walking, not elsewhere classified  Stiffness of right knee, not elsewhere classified     Problem List Patient Active Problem List   Diagnosis Date Noted  . Morbid obesity ([place]) 10/31/2017  . S/P right UKR, lateral 10/30/2017  . Incarcerated epigastric hernia 05/30/2015  . Left foot pain 08/09/2014  . Chronic diarrhea 07/27/2014  . Gout 08/10/2012  . Acid reflux 01/22/2012  . Down syndrome 11/11/2011  . Hernia of abdominal wall 10/21/2011  . Psoriasis 10/21/2011  . Type 2 diabetes mellitus, uncontrolled ([place]) 04/12/2011  . Hypothyroid 04/12/2011  . Gait abnormality 06/22/2010  . Tibial tendinitis, posterior 06/22/2010    Sumner Boast., PT 11/25/2017, 4:49 PM  [place] [address] [place], [place], [address] Phone: [phone]   Fax:  [phone]  Name: Wanda Oneill MRN: [id number] Date  of Birth: 03/16/1980

## 2017-11-26 ENCOUNTER — Encounter: Payer: Self-pay | Admitting: Physical Therapy

## 2017-11-26 ENCOUNTER — Ambulatory Visit: Payer: Medicare Other | Admitting: Physical Therapy

## 2017-11-26 DIAGNOSIS — M25561 Pain in right knee: Secondary | ICD-10-CM

## 2017-11-26 DIAGNOSIS — M25661 Stiffness of right knee, not elsewhere classified: Secondary | ICD-10-CM

## 2017-11-26 DIAGNOSIS — R600 Localized edema: Secondary | ICD-10-CM

## 2017-11-26 DIAGNOSIS — R262 Difficulty in walking, not elsewhere classified: Secondary | ICD-10-CM

## 2017-11-26 NOTE — Therapy (Signed)
[place] [address] [place], [place], [address] Phone: [phone]   Fax:  [phone]  Physical Therapy Treatment  Patient Details  Name: Wanda Oneill MRN: [id number] Date of Birth: [birth year] Referring Provider (PT): Cathe Mons Date: 11/26/2017  PT End of Session - 11/26/17 1510    Visit Number  5    Date for PT Re-Evaluation  01/13/18    PT Start Time  1430    PT Stop Time  1524    PT Time Calculation (min)  54 min    Activity Tolerance  Patient tolerated treatment well    Behavior During Therapy  [facility] for tasks assessed/performed       Past Medical History:  Diagnosis Date  . Diabetes mellitus    type II  . Down syndrome   . Family history of adverse reaction to anesthesia    mom has had n/v  . Gastritis   . GERD (gastroesophageal reflux disease)   . Headache   . Hepatic steatosis   . Hidradenitis   . Hypothyroidism   . Irritable bowel syndrome   . Periumbilical hernia   . Pneumonia   . Restless legs   . Tendonitis    chronic in left foot  . Thyroid disease    hypothyroidism    Past Surgical History:  Procedure Laterality Date  . AXILLARY HIDRADENITIS EXCISION Bilateral   . CHOLECYSTECTOMY    . HERNIA REPAIR     multiple  . INCISIONAL HERNIA REPAIR  05/30/2015   LAPROSCOPIC  . INCISIONAL HERNIA REPAIR N/A 05/30/2015   Procedure: LAPAROSCOPIC INCISIONAL HERNIA;  Surgeon: Rolm Bookbinder, MD;  Location: [place];  Service: General;  Laterality: N/A;  . INSERTION OF MESH N/A 05/30/2015   Procedure: INSERTION OF MESH;  Surgeon: Rolm Bookbinder, MD;  Location: [place];  Service: General;  Laterality: N/A;  . KNEE ARTHROSCOPY     right knee  . LAPAROSCOPY N/A 05/30/2015   Procedure: LAPAROSCOPY DIAGNOSTIC;  Surgeon: Rolm Bookbinder, MD;  Location: [place];  Service: General;  Laterality: N/A;  . PARTIAL KNEE ARTHROPLASTY Right 10/30/2017   Procedure: UNICOMPARTMENTAL RIGHT KNEE LATERAL;   Surgeon: Paralee Cancel, MD;  [facility];  Service: Orthopedics;  Laterality: Right;  90 mins  . TONSILLECTOMY     adenoids    There were no vitals filed for this visit.  Subjective Assessment - 11/26/17 1431    Subjective  Reports that she was hurting a little more after the walking yesterday    Currently in Pain?  Yes    Pain Score  6     Pain Location  Knee    Pain Orientation  Right    Aggravating Factors   walking    Pain Relieving Factors  rest and ice                       [facility] Adult PT Treatment/Exercise - 11/26/17 0001      Transfers   Comments  problem solved and worked on bathtub transfers, used a 6 " step to step over and then a 10 " high to step over      Ambulation/Gait   Gait Comments  gait to the front door and back cues to take bigger steps and walk faster x 200 feet      Knee/Hip Exercises: Stretches   Gastroc Stretch  2 reps;20 seconds      Knee/Hip Exercises:  Aerobic   Nustep  level 4 x 6 minutes      Knee/Hip Exercises: Machines for Strengthening   Cybex Leg Press  no weight working on ROM      Knee/Hip Exercises: Standing   Other Standing Knee Exercises  8" toe clears      Knee/Hip Exercises: Seated   Long Arc Quad  Strengthening;Right;2 sets;10 reps    Long Arc Quad Weight  3 lbs.    Hamstring Curl  Left;3 sets;10 reps    Hamstring Limitations  red tband      Knee/Hip Exercises: Supine   Other Supine Knee/Hip Exercises  feet on ball K2C, small bridges very difficult for her, needs to do sets of 3, then worked on her very weak core with isometric abdominals with ball pushing with some PT resistance      Modalities   Modalities  Vasopneumatic      Vasopneumatic   Number Minutes Vasopneumatic   10 minutes    Vasopnuematic Location   Knee    Vasopneumatic Pressure  Low    Vasopneumatic Temperature   38               PT Short Term Goals - 11/25/17 1648      PT SHORT TERM GOAL #1   Title  independent with  initial HEP    Status  Partially Met        PT Long Term Goals - 11/26/17 1516      PT LONG TERM GOAL #1   Title  understand [place]    Status  Achieved            Plan - 11/26/17 1511    Clinical Impression Statement  Patient does not like to exercise.  She had some questions about bathtub transfers.  She has a very week core.  [place] with her mom about the treansfers    PT Next Visit Plan  continue to push her function,  and if she allows the ROM    Consulted and Agree with Plan of Care  Patient       Patient will benefit from skilled therapeutic intervention in order to improve the following deficits and impairments:  Abnormal gait, Decreased range of motion, Difficulty walking, Increased muscle spasms, Decreased activity tolerance, Pain, Impaired flexibility, Decreased scar mobility, Decreased mobility, Decreased strength, Increased edema  Visit Diagnosis: Acute pain of right knee  Localized edema  Difficulty in walking, not elsewhere classified  Stiffness of right knee, not elsewhere classified     Problem List Patient Active Problem List   Diagnosis Date Noted  . Morbid obesity ([place]) 10/31/2017  . S/P right UKR, lateral 10/30/2017  . Incarcerated epigastric hernia 05/30/2015  . Left foot pain 08/09/2014  . Chronic diarrhea 07/27/2014  . Gout 08/10/2012  . Acid reflux 01/22/2012  . Down syndrome 11/11/2011  . Hernia of abdominal wall 10/21/2011  . Psoriasis 10/21/2011  . Type 2 diabetes mellitus, uncontrolled ([place]) 04/12/2011  . Hypothyroid 04/12/2011  . Gait abnormality 06/22/2010  . Tibial tendinitis, posterior 06/22/2010    Sumner Boast., PT 11/26/2017, 3:17 PM  [place] [address] [place], [place], [address] Phone: [phone]   Fax:  [phone]  Name: Wanda Oneill MRN: [id number] Date of Birth: 03-15-1980

## 2017-12-02 ENCOUNTER — Ambulatory Visit: Payer: Medicare Other | Admitting: Physical Therapy

## 2017-12-02 ENCOUNTER — Encounter: Payer: Self-pay | Admitting: Physical Therapy

## 2017-12-02 DIAGNOSIS — M25561 Pain in right knee: Secondary | ICD-10-CM | POA: Diagnosis not present

## 2017-12-02 DIAGNOSIS — M25661 Stiffness of right knee, not elsewhere classified: Secondary | ICD-10-CM

## 2017-12-02 DIAGNOSIS — R262 Difficulty in walking, not elsewhere classified: Secondary | ICD-10-CM

## 2017-12-02 DIAGNOSIS — R600 Localized edema: Secondary | ICD-10-CM

## 2017-12-02 NOTE — Therapy (Signed)
[place] [place], [place], [address] Phone: [phone]   Fax:  [phone]  Physical Therapy Treatment  Patient Details  Name: Wanda Oneill MRN: [id number] Date of Birth: 03/22/1980 Referring Provider (PT): Cathe Mons Date: 12/02/2017  PT End of Session - 12/02/17 1331    Visit Number  6    Date for PT Re-Evaluation  01/13/18    PT Start Time  1304    PT Stop Time  5638    PT Time Calculation (min)  51 min    Activity Tolerance  Patient tolerated treatment well    Behavior During Therapy  [facility] for tasks assessed/performed       Past Medical History:  Diagnosis Date  . Diabetes mellitus    type II  . Down syndrome   . Family history of adverse reaction to anesthesia    mom has had n/v  . Gastritis   . GERD (gastroesophageal reflux disease)   . Headache   . Hepatic steatosis   . Hidradenitis   . Hypothyroidism   . Irritable bowel syndrome   . Periumbilical hernia   . Pneumonia   . Restless legs   . Tendonitis    chronic in left foot  . Thyroid disease    hypothyroidism    Past Surgical History:  Procedure Laterality Date  . AXILLARY HIDRADENITIS EXCISION Bilateral   . CHOLECYSTECTOMY    . HERNIA REPAIR     multiple  . INCISIONAL HERNIA REPAIR  05/30/2015   LAPROSCOPIC  . INCISIONAL HERNIA REPAIR N/A 05/30/2015   Procedure: LAPAROSCOPIC INCISIONAL HERNIA;  Surgeon: Rolm Bookbinder, MD;  Location: [place];  Service: General;  Laterality: N/A;  . INSERTION OF MESH N/A 05/30/2015   Procedure: INSERTION OF MESH;  Surgeon: Rolm Bookbinder, MD;  Location: [place];  Service: General;  Laterality: N/A;  . KNEE ARTHROSCOPY     right knee  . LAPAROSCOPY N/A 05/30/2015   Procedure: LAPAROSCOPY DIAGNOSTIC;  Surgeon: Rolm Bookbinder, MD;  Location: [place];  Service: General;  Laterality: N/A;  . PARTIAL KNEE ARTHROPLASTY Right 10/30/2017   Procedure: UNICOMPARTMENTAL RIGHT KNEE LATERAL;   Surgeon: Paralee Cancel, MD;  [facility];  Service: Orthopedics;  Laterality: Right;  90 mins  . TONSILLECTOMY     adenoids    There were no vitals filed for this visit.  Subjective Assessment - 12/02/17 1306    Subjective  Patient reports that she is very tired today    Currently in Pain?  Yes    Pain Score  5     Pain Location  Knee    Pain Orientation  Right;Anterior;Proximal    Pain Descriptors / Indicators  Sore    Aggravating Factors   bending         [facility] PT Assessment - 12/02/17 0001      AROM   Right Knee Extension  17    Right Knee Flexion  101                   [facility] Adult PT Treatment/Exercise - 12/02/17 0001      Transfers   Comments  problem solved and worked on bathtub transfers, used a 6 " step to step over and then a 10 " high to step over      Ambulation/Gait   Gait Comments  gait outside around the building, and did a lot of work with her on negotiating  steps      Knee/Hip Exercises: Aerobic   Nustep  level 4 x 6 minutes      Knee/Hip Exercises: Seated   Long Arc Quad  Strengthening;Right;2 sets;10 reps    Long Arc Quad Weight  3 lbs.      Knee/Hip Exercises: Supine   Short Arc Quad Sets  Right;3 sets;10 reps;Limitations    Short Arc Quad Sets Limitations  3#    Other Supine Knee/Hip Exercises  feet on ball K2C, small bridges very difficult for her, needs to do sets of 3, then worked on her very weak core with isometric abdominals with ball pushing with some PT resistance      Modalities   Modalities  Vasopneumatic      Vasopneumatic   Number Minutes Vasopneumatic   10 minutes    Vasopnuematic Location   Knee    Vasopneumatic Pressure  Low    Vasopneumatic Temperature   37               PT Short Term Goals - 12/02/17 1334      PT SHORT TERM GOAL #1   Title  independent with initial HEP    Status  Achieved        PT Long Term Goals - 11/26/17 1516      PT LONG TERM GOAL #1   Title  understand [place]     Status  Achieved            Plan - 12/02/17 1332    Clinical Impression Statement  Patient reports that she tried to get in and out of bathtub at home and was able to do, we worked again on this and she did well with some of the cues given.  She told me she was having difficulty with curbs, with a few cues she really did well only slight hesitation going up and down.    PT Next Visit Plan  continue to push her function,  and if she allows the ROM    Consulted and Agree with Plan of Care  Patient       Patient will benefit from skilled therapeutic intervention in order to improve the following deficits and impairments:  Abnormal gait, Decreased range of motion, Difficulty walking, Increased muscle spasms, Decreased activity tolerance, Pain, Impaired flexibility, Decreased scar mobility, Decreased mobility, Decreased strength, Increased edema  Visit Diagnosis: Acute pain of right knee  Localized edema  Difficulty in walking, not elsewhere classified  Stiffness of right knee, not elsewhere classified     Problem List Patient Active Problem List   Diagnosis Date Noted  . Morbid obesity ([place]) 10/31/2017  . S/P right UKR, lateral 10/30/2017  . Incarcerated epigastric hernia 05/30/2015  . Left foot pain 08/09/2014  . Chronic diarrhea 07/27/2014  . Gout 08/10/2012  . Acid reflux 01/22/2012  . Down syndrome 11/11/2011  . Hernia of abdominal wall 10/21/2011  . Psoriasis 10/21/2011  . Type 2 diabetes mellitus, uncontrolled ([place]) 04/12/2011  . Hypothyroid 04/12/2011  . Gait abnormality 06/22/2010  . Tibial tendinitis, posterior 06/22/2010    Sumner Boast., PT 12/02/2017, 1:40 PM  [place] [address] [place], [place], [address] Phone: [phone]   Fax:  [phone]  Name: Wanda Oneill MRN: [id number] Date of Birth: February 27, 1980

## 2017-12-04 ENCOUNTER — Ambulatory Visit: Payer: Medicare Other | Admitting: Physical Therapy

## 2017-12-09 ENCOUNTER — Ambulatory Visit: Payer: Medicare Other | Attending: Orthopedic Surgery | Admitting: Physical Therapy

## 2017-12-09 DIAGNOSIS — M25661 Stiffness of right knee, not elsewhere classified: Secondary | ICD-10-CM

## 2017-12-09 DIAGNOSIS — R600 Localized edema: Secondary | ICD-10-CM | POA: Diagnosis present

## 2017-12-09 DIAGNOSIS — R262 Difficulty in walking, not elsewhere classified: Secondary | ICD-10-CM | POA: Diagnosis present

## 2017-12-09 DIAGNOSIS — M25561 Pain in right knee: Secondary | ICD-10-CM | POA: Diagnosis not present

## 2017-12-09 NOTE — Therapy (Signed)
[address] [place], [place], [address] Phone: [phone]   Fax:  [phone]  Physical Therapy Treatment  Patient Details  Name: Wanda Oneill MRN: [id number] Date of Birth: Oct 15, 1980 Referring Provider (PT): Cathe Mons Date: 12/09/2017  PT End of Session - 12/09/17 1640    Visit Number  7    Date for PT Re-Evaluation  01/13/18    PT Start Time  4818    PT Stop Time  1700    PT Time Calculation (min)  56 min       Past Medical History:  Diagnosis Date  . Diabetes mellitus    type II  . Down syndrome   . Family history of adverse reaction to anesthesia    mom has had n/v  . Gastritis   . GERD (gastroesophageal reflux disease)   . Headache   . Hepatic steatosis   . Hidradenitis   . Hypothyroidism   . Irritable bowel syndrome   . Periumbilical hernia   . Pneumonia   . Restless legs   . Tendonitis    chronic in left foot  . Thyroid disease    hypothyroidism    Past Surgical History:  Procedure Laterality Date  . AXILLARY HIDRADENITIS EXCISION Bilateral   . CHOLECYSTECTOMY    . HERNIA REPAIR     multiple  . INCISIONAL HERNIA REPAIR  05/30/2015   LAPROSCOPIC  . INCISIONAL HERNIA REPAIR N/A 05/30/2015   Procedure: LAPAROSCOPIC INCISIONAL HERNIA;  Surgeon: Rolm Bookbinder, MD;  Location: [place];  Service: General;  Laterality: N/A;  . INSERTION OF MESH N/A 05/30/2015   Procedure: INSERTION OF MESH;  Surgeon: Rolm Bookbinder, MD;  Location: [place];  Service: General;  Laterality: N/A;  . KNEE ARTHROSCOPY     right knee  . LAPAROSCOPY N/A 05/30/2015   Procedure: LAPAROSCOPY DIAGNOSTIC;  Surgeon: Rolm Bookbinder, MD;  Location: [place];  Service: General;  Laterality: N/A;  . PARTIAL KNEE ARTHROPLASTY Right 10/30/2017   Procedure: UNICOMPARTMENTAL RIGHT KNEE LATERAL;  Surgeon: Paralee Cancel, MD;  [facility];  Service: Orthopedics;  Laterality: Right;  90 mins  . TONSILLECTOMY      adenoids    There were no vitals filed for this visit.  Subjective Assessment - 12/09/17 1606    Subjective  still hurts but better. cant cross RT leg like I do left    Currently in Pain?  Yes    Pain Score  5     Pain Location  Knee    Pain Orientation  Right         [facility] PT Assessment - 12/09/17 0001      AROM   Right Knee Extension  6                   [facility] Adult PT Treatment/Exercise - 12/09/17 0001      Exercises   Exercises  Knee/Hip      Knee/Hip Exercises: Aerobic   Nustep  L 4 6 min LE only      Knee/Hip Exercises: Machines for Strengthening   Cybex Knee Extension  5# 2 sets 10    Cybex Knee Flexion  20# 2 sets 10    Cybex Leg Press  20# 2 sets 10      Knee/Hip Exercises: Standing   Heel Raises  Both;2 sets;10 reps   black bar   Lateral Step Up  Right;1 set;15 reps;Hand Hold: 2;Step  Height: 6"    Forward Step Up  Right;15 reps;Hand Hold: 2;Step Height: 6"      Modalities   Modalities  Vasopneumatic      Vasopneumatic   Number Minutes Vasopneumatic   10 minutes    Vasopnuematic Location   Knee    Vasopneumatic Pressure  Low    Vasopneumatic Temperature   37               PT Short Term Goals - 12/02/17 1334      PT SHORT TERM GOAL #1   Title  independent with initial HEP    Status  Achieved        PT Long Term Goals - 12/09/17 1642      PT LONG TERM GOAL #2   Title  dcrease pain 50%    Status  Partially Met      PT LONG TERM GOAL #3   Title  increase AROM of knee flexion to 10 to 115 degrees flexion    Status  Partially Met      PT LONG TERM GOAL #4   Title  go down stairs step over step    Status  On-going      PT LONG TERM GOAL #5   Title  walk with SPC or less device for home and community distances    Status  Achieved            Plan - 12/09/17 1640    Clinical Impression Statement  initiated wt machines today for strength and she tolerated well, needed cues to work legs symmetrically and for  full TKE.pt with heavy use of UE and very fatigued with step ups esp laterally. pt with much improved ext, measured after ther ex. progressing with goals.    PT Treatment/Interventions  ADLs/Self Care Home Management;Cryotherapy;Electrical Stimulation;Gait training;Balance training;Therapeutic exercise;Therapeutic activities;Functional mobility training;Stair training;Patient/family education;Manual techniques;Vasopneumatic Device    PT Next Visit Plan  assess how she felt after weighted ex and work on steps and neg obstacles as pt verb walked in community with friends but fatigued       Patient will benefit from skilled therapeutic intervention in order to improve the following deficits and impairments:  Abnormal gait, Decreased range of motion, Difficulty walking, Increased muscle spasms, Decreased activity tolerance, Pain, Impaired flexibility, Decreased scar mobility, Decreased mobility, Decreased strength, Increased edema  Visit Diagnosis: Acute pain of right knee  Localized edema  Difficulty in walking, not elsewhere classified  Stiffness of right knee, not elsewhere classified     Problem List Patient Active Problem List   Diagnosis Date Noted  . Morbid obesity ([place]) 10/31/2017  . S/P right UKR, lateral 10/30/2017  . Incarcerated epigastric hernia 05/30/2015  . Left foot pain 08/09/2014  . Chronic diarrhea 07/27/2014  . Gout 08/10/2012  . Acid reflux 01/22/2012  . Down syndrome 11/11/2011  . Hernia of abdominal wall 10/21/2011  . Psoriasis 10/21/2011  . Type 2 diabetes mellitus, uncontrolled ([place]) 04/12/2011  . Hypothyroid 04/12/2011  . Gait abnormality 06/22/2010  . Tibial tendinitis, posterior 06/22/2010    Pinchos Topel,ANGIE PTA 12/09/2017, 4:47 PM  [address] [place], [place], [address] Phone: [phone]   Fax:  [phone]  Name: Wanda Oneill MRN: [id number] Date of Birth:  03-22-1980

## 2017-12-24 ENCOUNTER — Ambulatory Visit: Payer: Medicare Other | Admitting: Physical Therapy

## 2017-12-24 DIAGNOSIS — M25561 Pain in right knee: Secondary | ICD-10-CM

## 2017-12-24 DIAGNOSIS — R600 Localized edema: Secondary | ICD-10-CM

## 2017-12-24 DIAGNOSIS — R262 Difficulty in walking, not elsewhere classified: Secondary | ICD-10-CM

## 2017-12-24 DIAGNOSIS — M25661 Stiffness of right knee, not elsewhere classified: Secondary | ICD-10-CM

## 2017-12-24 NOTE — Therapy (Signed)
[address] [place], [place], [address] Phone: [phone]   Fax:  [phone]  Physical Therapy Treatment  Patient Details  Name: Wanda Oneill MRN: [id number] Date of Birth: [birth year] Referring Provider (PT): Cathe Mons Date: 12/24/2017  PT End of Session - 12/24/17 1134    Visit Number  8    Date for PT Re-Evaluation  01/13/18    PT Start Time  1102    PT Stop Time  1155    PT Time Calculation (min)  53 min    Activity Tolerance  Patient tolerated treatment well    Behavior During Therapy  [facility] for tasks assessed/performed       Past Medical History:  Diagnosis Date  . Diabetes mellitus    type II  . Down syndrome   . Family history of adverse reaction to anesthesia    mom has had n/v  . Gastritis   . GERD (gastroesophageal reflux disease)   . Headache   . Hepatic steatosis   . Hidradenitis   . Hypothyroidism   . Irritable bowel syndrome   . Periumbilical hernia   . Pneumonia   . Restless legs   . Tendonitis    chronic in left foot  . Thyroid disease    hypothyroidism    Past Surgical History:  Procedure Laterality Date  . AXILLARY HIDRADENITIS EXCISION Bilateral   . CHOLECYSTECTOMY    . HERNIA REPAIR     multiple  . INCISIONAL HERNIA REPAIR  05/30/2015   LAPROSCOPIC  . INCISIONAL HERNIA REPAIR N/A 05/30/2015   Procedure: LAPAROSCOPIC INCISIONAL HERNIA;  Surgeon: Rolm Bookbinder, MD;  Location: [place];  Service: General;  Laterality: N/A;  . INSERTION OF MESH N/A 05/30/2015   Procedure: INSERTION OF MESH;  Surgeon: Rolm Bookbinder, MD;  Location: [place];  Service: General;  Laterality: N/A;  . KNEE ARTHROSCOPY     right knee  . LAPAROSCOPY N/A 05/30/2015   Procedure: LAPAROSCOPY DIAGNOSTIC;  Surgeon: Rolm Bookbinder, MD;  Location: [place];  Service: General;  Laterality: N/A;  . PARTIAL KNEE ARTHROPLASTY Right 10/30/2017   Procedure: UNICOMPARTMENTAL RIGHT KNEE LATERAL;   Surgeon: Paralee Cancel, MD;  Location: [facility];  Service: Orthopedics;  Laterality: Right;  90 mins  . TONSILLECTOMY     adenoids    There were no vitals filed for this visit.  Subjective Assessment - 12/24/17 1127    Subjective  Pt relays her knee is getting better, no complaints after last session adding in wt machines    Patient Stated Goals  have more motions, no pain and walk without device    Currently in Pain?  Yes    Pain Score  3     Pain Orientation  Right    Pain Descriptors / Indicators  Sore                       [facility] Adult PT Treatment/Exercise - 12/24/17 0001      Knee/Hip Exercises: Aerobic   Nustep  L 4 6 min LE only      Knee/Hip Exercises: Machines for Strengthening   Cybex Knee Extension  10 lbs 2 sets of 10    Cybex Knee Flexion  20 lbs 2 sets of 15    Cybex Leg Press  10 lbs 2 sets of 10      Knee/Hip Exercises: Standing   Lateral Step Up  Right;1 set;15  reps;Hand Hold: 2;Step Height: 6"    Forward Step Up  Right;15 reps;Hand Hold: 2;Step Height: 6"    Other Standing Knee Exercises  stepping over objects in clinic step to and reciprocal      Modalities   Modalities  Vasopneumatic      Vasopneumatic   Number Minutes Vasopneumatic   10 minutes    Vasopnuematic Location   Knee    Vasopneumatic Pressure  Low    Vasopneumatic Temperature   37               PT Short Term Goals - 12/02/17 1334      PT SHORT TERM GOAL #1   Title  independent with initial HEP    Status  Achieved        PT Long Term Goals - 12/09/17 1642      PT LONG TERM GOAL #2   Title  dcrease pain 50%    Status  Partially Met      PT LONG TERM GOAL #3   Title  increase AROM of knee flexion to 10 to 115 degrees flexion    Status  Partially Met      PT LONG TERM GOAL #4   Title  go down stairs step over step    Status  On-going      PT LONG TERM GOAL #5   Title  walk with SPC or less device for home and community distances    Status  Achieved             Plan - 12/24/17 1134    Clinical Impression Statement  Session focused on functional knee strengthening with good tolerance. Weightt machines continued and she was able to go up in reps with knee flexion machine and up in weight with knee ext machine but she had to decrease weight with leg press today. Balance training was progressed today to add in stepping over objects today where she was supersion with stepping over with recovery step in bbetween obstacles but she required min A for stepping reciproally over obstacles.     Rehab Potential  Good    PT Frequency  2x / week    PT Duration  8 weeks    PT Treatment/Interventions  ADLs/Self Care Home Management;Cryotherapy;Electrical Stimulation;Gait training;Balance training;Therapeutic exercise;Therapeutic activities;Functional mobility training;Stair training;Patient/family education;Manual techniques;Vasopneumatic Device    PT Next Visit Plan  work on steps and neg obstacles as pt verb walked in community with friends but fatigued    Consulted and Agree with Plan of Care  Patient       Patient will benefit from skilled therapeutic intervention in order to improve the following deficits and impairments:  Abnormal gait, Decreased range of motion, Difficulty walking, Increased muscle spasms, Decreased activity tolerance, Pain, Impaired flexibility, Decreased scar mobility, Decreased mobility, Decreased strength, Increased edema  Visit Diagnosis: Acute pain of right knee  Localized edema  Difficulty in walking, not elsewhere classified  Stiffness of right knee, not elsewhere classified     Problem List Patient Active Problem List   Diagnosis Date Noted  . Morbid obesity ([place]) 10/31/2017  . S/P right UKR, lateral 10/30/2017  . Incarcerated epigastric hernia 05/30/2015  . Left foot pain 08/09/2014  . Chronic diarrhea 07/27/2014  . Gout 08/10/2012  . Acid reflux 01/22/2012  . Down syndrome 11/11/2011  . Hernia of  abdominal wall 10/21/2011  . Psoriasis 10/21/2011  . Type 2 diabetes mellitus, uncontrolled ([place]) 04/12/2011  . Hypothyroid 04/12/2011  .  Gait abnormality 06/22/2010  . Tibial tendinitis, posterior 06/22/2010    Debbe Odea, PT,DPT 12/24/2017, 11:58 AM  [address] [place], [place], [address] Phone: [phone]   Fax:  [phone]  Name: Wanda Oneill MRN: [id number] Date of Birth: 01-07-1981

## 2017-12-26 ENCOUNTER — Other Ambulatory Visit: Payer: Self-pay | Admitting: Endocrinology

## 2017-12-26 DIAGNOSIS — E039 Hypothyroidism, unspecified: Secondary | ICD-10-CM

## 2017-12-31 ENCOUNTER — Ambulatory Visit: Payer: Medicare Other | Admitting: Physical Therapy

## 2018-01-05 ENCOUNTER — Telehealth: Payer: Self-pay | Admitting: Family Medicine

## 2018-01-05 NOTE — Telephone Encounter (Signed)
Called patient in regards to needing to know where she had her diabetic eye exam at so we can obtain those records.

## 2018-01-07 ENCOUNTER — Ambulatory Visit: Payer: Medicare Other | Admitting: Physical Therapy

## 2018-01-14 ENCOUNTER — Ambulatory Visit: Payer: Medicare Other | Attending: Orthopedic Surgery | Admitting: Physical Therapy

## 2018-01-14 DIAGNOSIS — R262 Difficulty in walking, not elsewhere classified: Secondary | ICD-10-CM | POA: Insufficient documentation

## 2018-01-14 DIAGNOSIS — M25561 Pain in right knee: Secondary | ICD-10-CM | POA: Insufficient documentation

## 2018-01-14 NOTE — Therapy (Signed)
[address] [place], [place], [address] Phone: [phone]   Fax:  [phone]  Physical Therapy Treatment  Patient Details  Name: Wanda Oneill MRN: [id number] Date of Birth: 25-May-1980 Referring Provider (PT): Cathe Mons Date: 01/14/2018  PT End of Session - 01/14/18 1105    Visit Number  9    Date for PT Re-Evaluation  01/13/18    PT Start Time  1100    PT Stop Time  1140    PT Time Calculation (min)  40 min       Past Medical History:  Diagnosis Date  . Diabetes mellitus    type II  . Down syndrome   . Family history of adverse reaction to anesthesia    mom has had n/v  . Gastritis   . GERD (gastroesophageal reflux disease)   . Headache   . Hepatic steatosis   . Hidradenitis   . Hypothyroidism   . Irritable bowel syndrome   . Periumbilical hernia   . Pneumonia   . Restless legs   . Tendonitis    chronic in left foot  . Thyroid disease    hypothyroidism    Past Surgical History:  Procedure Laterality Date  . AXILLARY HIDRADENITIS EXCISION Bilateral   . CHOLECYSTECTOMY    . HERNIA REPAIR     multiple  . INCISIONAL HERNIA REPAIR  05/30/2015   LAPROSCOPIC  . INCISIONAL HERNIA REPAIR N/A 05/30/2015   Procedure: LAPAROSCOPIC INCISIONAL HERNIA;  Surgeon: Rolm Bookbinder, MD;  Location: [place];  Service: General;  Laterality: N/A;  . INSERTION OF MESH N/A 05/30/2015   Procedure: INSERTION OF MESH;  Surgeon: Rolm Bookbinder, MD;  Location: [place];  Service: General;  Laterality: N/A;  . KNEE ARTHROSCOPY     right knee  . LAPAROSCOPY N/A 05/30/2015   Procedure: LAPAROSCOPY DIAGNOSTIC;  Surgeon: Rolm Bookbinder, MD;  Location: [place];  Service: General;  Laterality: N/A;  . PARTIAL KNEE ARTHROPLASTY Right 10/30/2017   Procedure: UNICOMPARTMENTAL RIGHT KNEE LATERAL;  Surgeon: Paralee Cancel, MD;  Location: [facility];  Service: Orthopedics;  Laterality: Right;  90 mins  . TONSILLECTOMY      adenoids    There were no vitals filed for this visit.  Subjective Assessment - 01/14/18 1058    Subjective  overall doing much better 75%, some mild pain esp if I overdo    Currently in Pain?  Yes    Pain Score  3     Pain Location  Knee    Pain Orientation  Right         [facility] PT Assessment - 01/14/18 0001      AROM   Right Knee Extension  0    Right Knee Flexion  115      Strength   Overall Strength Comments  4+/5                   [facility] Adult PT Treatment/Exercise - 01/14/18 0001      Exercises   Exercises  Knee/Hip      Knee/Hip Exercises: Aerobic   Recumbent Bike  6 min    Nustep  L 5 6 min LE only      Knee/Hip Exercises: Machines for Strengthening   Cybex Knee Extension  10 lbs 2 sets of 10    Cybex Knee Flexion  20 lbs 2 sets of 15    Cybex Leg Press  20# 2  sets 10      Knee/Hip Exercises: Standing   Heel Raises  Both;2 sets;10 reps             PT Education - 01/14/18 1103    Education Details  educ on doing bike at apt gym and walking program    Person(s) Educated  Patient    Methods  Explanation    Comprehension  Verbalized understanding       PT Short Term Goals - 12/02/17 1334      PT SHORT TERM GOAL #1   Title  independent with initial HEP    Status  Achieved        PT Long Term Goals - 01/14/18 1100      PT LONG TERM GOAL #2   Title  dcrease pain 50%    Status  Achieved      PT LONG TERM GOAL #3   Title  increase AROM of knee flexion to 10 to 115 degrees flexion    Status  Achieved      PT LONG TERM GOAL #4   Title  go down stairs step over step    Status  Achieved      PT LONG TERM GOAL #5   Title  walk with SPC or less device for home and community distances            Plan - 01/14/18 1106    Clinical Impression Statement  all goals met. educ on ex for strength and endurance at home and apartment gym.    PT Treatment/Interventions  ADLs/Self Care Home Management;Cryotherapy;Electrical  Stimulation;Gait training;Balance training;Therapeutic exercise;Therapeutic activities;Functional mobility training;Stair training;Patient/family education;Manual techniques;Vasopneumatic Device    PT Next Visit Plan  D/C       Patient will benefit from skilled therapeutic intervention in order to improve the following deficits and impairments:  Abnormal gait, Decreased range of motion, Difficulty walking, Increased muscle spasms, Decreased activity tolerance, Pain, Impaired flexibility, Decreased scar mobility, Decreased mobility, Decreased strength, Increased edema  Visit Diagnosis: Acute pain of right knee  Difficulty in walking, not elsewhere classified     Problem List Patient Active Problem List   Diagnosis Date Noted  . Morbid obesity ([place]) 10/31/2017  . S/P right UKR, lateral 10/30/2017  . Incarcerated epigastric hernia 05/30/2015  . Left foot pain 08/09/2014  . Chronic diarrhea 07/27/2014  . Gout 08/10/2012  . Acid reflux 01/22/2012  . Down syndrome 11/11/2011  . Hernia of abdominal wall 10/21/2011  . Psoriasis 10/21/2011  . Type 2 diabetes mellitus, uncontrolled ([place]) 04/12/2011  . Hypothyroid 04/12/2011  . Gait abnormality 06/22/2010  . Tibial tendinitis, posterior 06/22/2010   PHYSICAL THERAPY DISCHARGE SUMMARY   Plan: Patient agrees to discharge.  Patient goals were met. Patient is being discharged due to meeting the stated rehab goals.  ?????      Wanda Oneill,ANGIE PTA 01/14/2018, 11:18 AM  [address] [place], [place], [address] Phone: [phone]   Fax:  [phone]  Name: Wanda Oneill MRN: [id number] Date of Birth: [birth year]

## 2018-01-21 ENCOUNTER — Other Ambulatory Visit: Payer: Medicare Other

## 2018-01-21 ENCOUNTER — Ambulatory Visit
Admission: RE | Admit: 2018-01-21 | Discharge: 2018-01-21 | Disposition: A | Discharge status: Home / Self Care | Payer: Medicare Other | Source: Ambulatory Visit | Attending: Endocrinology | Admitting: Endocrinology

## 2018-01-21 DIAGNOSIS — E039 Hypothyroidism, unspecified: Secondary | ICD-10-CM

## 2018-03-05 ENCOUNTER — Other Ambulatory Visit: Payer: Self-pay | Admitting: Family Medicine

## 2018-03-10 ENCOUNTER — Other Ambulatory Visit: Payer: Self-pay | Admitting: Family Medicine

## 2018-03-10 ENCOUNTER — Encounter: Payer: Self-pay | Admitting: Family Medicine

## 2018-03-10 MED ORDER — LEVOTHYROXINE SODIUM 137 MCG PO TABS: 137.0000 ug | ORAL_TABLET | Freq: Every morning | ORAL | 0 refills | Status: DC

## 2018-03-10 NOTE — Progress Notes (Signed)
Lab Results  Component Value Date   TSH 3.669 07/03/2011

## 2018-03-11 ENCOUNTER — Other Ambulatory Visit: Payer: Self-pay | Admitting: Internal Medicine

## 2018-04-24 ENCOUNTER — Telehealth: Payer: Self-pay | Admitting: Internal Medicine

## 2018-04-24 NOTE — Telephone Encounter (Signed)
Discussed with pts mother that Dr. Hilarie Fredrickson had stated that pt could take 4gm every am but may titrate to BID if needed. She verbalized understanding. Will call back if they need a different prescription.

## 2018-04-28 ENCOUNTER — Encounter: Payer: Self-pay | Admitting: Family Medicine

## 2018-04-28 ENCOUNTER — Ambulatory Visit: Payer: Medicare Other | Admitting: Internal Medicine

## 2018-04-29 ENCOUNTER — Ambulatory Visit: Payer: Medicare Other | Admitting: Family Medicine

## 2018-05-06 MED ORDER — CHOLESTYRAMINE 4 G PO PACK: 4.0000 g | PACK | Freq: Two times a day (BID) | ORAL | 2 refills | Status: DC

## 2018-05-06 NOTE — Addendum Note (Signed)
Addended by: Larina Bras on: 05/06/2018 02:29 PM   Modules accepted: Orders

## 2018-05-06 NOTE — Telephone Encounter (Signed)
Pt requested new script for Questran to state BID instead of daily.

## 2018-05-06 NOTE — Telephone Encounter (Signed)
Rx sent 

## 2018-05-13 ENCOUNTER — Other Ambulatory Visit: Payer: Self-pay | Admitting: Endocrinology

## 2018-05-13 DIAGNOSIS — E039 Hypothyroidism, unspecified: Secondary | ICD-10-CM

## 2018-06-05 ENCOUNTER — Other Ambulatory Visit: Payer: Self-pay | Admitting: Family Medicine

## 2018-06-19 ENCOUNTER — Other Ambulatory Visit: Payer: Medicare Other

## 2018-06-19 ENCOUNTER — Ambulatory Visit
Admission: RE | Admit: 2018-06-19 | Discharge: 2018-06-19 | Disposition: A | Discharge status: Home / Self Care | Payer: Medicare Other | Source: Ambulatory Visit | Attending: Endocrinology | Admitting: Endocrinology

## 2018-06-19 DIAGNOSIS — E039 Hypothyroidism, unspecified: Secondary | ICD-10-CM

## 2018-06-24 ENCOUNTER — Other Ambulatory Visit: Payer: Self-pay | Admitting: General Surgery

## 2018-06-24 DIAGNOSIS — R1013 Epigastric pain: Secondary | ICD-10-CM

## 2018-06-28 ENCOUNTER — Other Ambulatory Visit: Payer: Self-pay | Admitting: Family Medicine

## 2018-07-02 ENCOUNTER — Ambulatory Visit
Admission: RE | Admit: 2018-07-02 | Discharge: 2018-07-02 | Disposition: A | Discharge status: Home / Self Care | Payer: Medicare Other | Source: Ambulatory Visit | Attending: General Surgery | Admitting: General Surgery

## 2018-07-02 DIAGNOSIS — R1013 Epigastric pain: Secondary | ICD-10-CM

## 2018-07-02 MED ORDER — IOPAMIDOL (ISOVUE-300) INJECTION 61%
125.0000 mL | Freq: Once | INTRAVENOUS | Status: AC | PRN
  Administered 2018-07-02: 125 mL via INTRAVENOUS

## 2018-07-03 ENCOUNTER — Other Ambulatory Visit: Payer: Self-pay | Admitting: Family Medicine

## 2018-07-07 ENCOUNTER — Other Ambulatory Visit: Payer: Self-pay | Admitting: Family Medicine

## 2018-07-07 MED ORDER — ALLOPURINOL 300 MG PO TABS: 300.0000 mg | ORAL_TABLET | Freq: Every day | ORAL | 3 refills | Status: DC

## 2018-07-07 MED ORDER — BLOOD GLUCOSE TEST VI STRP: ORAL_STRIP | 3 refills | Status: DC

## 2018-07-22 ENCOUNTER — Other Ambulatory Visit: Payer: Self-pay

## 2018-07-22 ENCOUNTER — Ambulatory Visit (INDEPENDENT_AMBULATORY_CARE_PROVIDER_SITE_OTHER): Payer: Medicare Other | Admitting: Family

## 2018-07-22 ENCOUNTER — Encounter: Payer: Self-pay | Admitting: Family

## 2018-07-22 DIAGNOSIS — L03311 Cellulitis of abdominal wall: Secondary | ICD-10-CM | POA: Diagnosis not present

## 2018-07-22 MED ORDER — CLINDAMYCIN HCL 150 MG PO CAPS: 450.0000 mg | ORAL_CAPSULE | Freq: Three times a day (TID) | ORAL | 0 refills | Status: AC

## 2018-07-22 NOTE — Progress Notes (Signed)
Virtual Visit via Video Note  I connected with Wanda Oneill on 07/22/18 at 12:20 PM EDT by a video enabled telemedicine application and verified that I am speaking with the correct person using two identifiers.  Location: Patient: home Provider: office   I discussed the limitations of evaluation and management by telemedicine and the availability of in person appointments. The patient expressed understanding and agreed to proceed.  History of Present Illness:  Patient is a 38 yr old female with history of Down's syndrome and diabetes type II, who presents today with her mother for a virtual visit. Pt reports that she developed a "pimple" on her left lower abdomen 3 days ago. Mother saw it last night noted significant surrounding redness. They had some doxycycline on hand which they gave her but she has trouble tolerating doxy due to GI side effects. She is allergic to sulfa antibiotics.  Pt reports that the area is tender.  Mother outlined the redness with a black marker last night and notes that the redness has spread slightly outside of the demarcation.  Mother who is an Therapist, sports states that the area of redness if firm and without fluctuance.     Observations/Objective:  Gen: Awake, alert, no acute distress Resp: Breathing is even and non-labored Psych: calm/pleasant demeanor Neuro: Alert and Oriented x 3, + facial symmetry, speech is slightly dysarthric Skin:  + oval area of redness noted left lower abdomen (about 3-4 inches in diameter) with a central scabbed lesion   Assessment and Plan:  Cellulitis- Suspect MRSA.  will give trial of clindamycin to see if she can better tolerate compared to doxycycline.  Mother will continue to check the area daily and will let us know if increased pain, redness or if fluctuance develops. Pt and mother verbalize understanding and agree to plan.    Follow Up Instructions:    I discussed the assessment and treatment plan with the patient. The patient  was provided an opportunity to ask questions and all were answered. The patient agreed with the plan and demonstrated an understanding of the instructions.   The patient was advised to call back or seek an in-person evaluation if the symptoms worsen or if the condition fails to improve as anticipated.   [name], NP

## 2018-07-24 ENCOUNTER — Telehealth: Payer: Self-pay | Admitting: Family

## 2018-07-24 NOTE — Telephone Encounter (Signed)
Attempted to call mother to follow up on patient's cellulitis- no answer and no voicemail set up.

## 2018-07-27 NOTE — Telephone Encounter (Signed)
Contacted pt's mother. She reports that patient is doing much better, redness is much improved.

## 2018-07-31 ENCOUNTER — Other Ambulatory Visit: Payer: Self-pay | Admitting: Internal Medicine

## 2018-08-03 ENCOUNTER — Ambulatory Visit: Payer: Medicare Other | Admitting: Family Medicine

## 2018-08-09 NOTE — Progress Notes (Signed)
[facility] [address] [place], [place], [place] [address] [phone] [id number]  Date:  08/10/2018   Name:  Wanda Oneill   DOB:  1980/10/08   MRN:  [id number]  PCP:  Darreld Mclean, MD    Chief Complaint: Diabetes   History of Present Illness:  Wanda Oneill is a 38 y.o. very pleasant female patient who presents with the following:  Charming woman with history of Down syndrome and diabetes, hypothyroidism, obesity Here today for a recheck visit with her mom Wanda Oneill who contributes to the history  She recently did a evisit with Melissa due to possible cellulitis of her abdomen, treated with clindamycin   Most recent labs this fall She is getting her A1c and TSH checks per Dr. Chalmers Cater, her endocrinologist - they saw her in the recent past although I do not have the exact date   Foot exam due Urine micro due Pap? Eye exam - they will catch up, had to postpone due to covid Pneumovax- she did have this done a few years ago, developed itching at the injection site   Allopurinol- she has not noted any gout sx Zyrtec Bentyl jardiance 25 ocp synthroid Metformin 1000 BID ozempic  Wt Readings from Last 3 Encounters:  08/10/18 234 lb (106.1 kg)  10/30/17 237 lb 3.2 oz (107.6 kg)  10/20/17 237 lb 3.2 oz (107.6 kg)   She recently moved to a new apt- she is enjoying it, it is larger and really nice She does tend to have some chronic lower back pain. They wonder if I can refer her to see Neurosurgery- she was noted to have some degenerative change on her most recent CT abd/ pelvis from May: Musculoskeletal: There is degenerative change in the lower thoracic and lumbar regions. No blastic or lytic bone lesions. There are no intramuscular lesions evident.  [name] also mentions today that she might have some CP for the last 2 months- non exertional, might last 10 minutes or so. She is not sure quite how often she may notice it Not  having any such sx currently  She has not seen cardiology since she was a young child    Lab Results  Component Value Date   HGBA1C 7.2 (H) 10/20/2017     Patient Active Problem List   Diagnosis Date Noted  . Morbid obesity ([place]) 10/31/2017  . S/P right UKR, lateral 10/30/2017  . Incarcerated epigastric hernia 05/30/2015  . Left foot pain 08/09/2014  . Chronic diarrhea 07/27/2014  . Gout 08/10/2012  . Acid reflux 01/22/2012  . Down syndrome 11/11/2011  . Hernia of abdominal wall 10/21/2011  . Psoriasis 10/21/2011  . Type 2 diabetes mellitus, uncontrolled ([place]) 04/12/2011  . Hypothyroid 04/12/2011  . Gait abnormality 06/22/2010  . Tibial tendinitis, posterior 06/22/2010    Past Medical History:  Diagnosis Date  . Diabetes mellitus    type II  . Down syndrome   . Family history of adverse reaction to anesthesia    mom has had n/v  . Gastritis   . GERD (gastroesophageal reflux disease)   . Headache   . Hepatic steatosis   . Hidradenitis   . Hypothyroidism   . Irritable bowel syndrome   . Periumbilical hernia   . Pneumonia   . Restless legs   . Tendonitis    chronic in left foot  . Thyroid disease    hypothyroidism    Past Surgical History:  Procedure Laterality Date  . AXILLARY HIDRADENITIS EXCISION Bilateral   . CHOLECYSTECTOMY    . HERNIA REPAIR     multiple  . INCISIONAL HERNIA REPAIR  05/30/2015   LAPROSCOPIC  . INCISIONAL HERNIA REPAIR N/A 05/30/2015   Procedure: LAPAROSCOPIC INCISIONAL HERNIA;  Surgeon: Rolm Bookbinder, MD;  Location: [facility];  Service: General;  Laterality: N/A;  . INSERTION OF MESH N/A 05/30/2015   Procedure: INSERTION OF MESH;  Surgeon: Rolm Bookbinder, MD;  Location: [facility];  Service: General;  Laterality: N/A;  . KNEE ARTHROSCOPY     right knee  . LAPAROSCOPY N/A 05/30/2015   Procedure: LAPAROSCOPY DIAGNOSTIC;  Surgeon: Rolm Bookbinder, MD;  Location: [facility];  Service: General;  Laterality: N/A;  . PARTIAL KNEE ARTHROPLASTY  Right 10/30/2017   Procedure: UNICOMPARTMENTAL RIGHT KNEE LATERAL;  Surgeon: Paralee Cancel, MD;  Location: [facility];  Service: Orthopedics;  Laterality: Right;  90 mins  . TONSILLECTOMY     adenoids    Social History   Tobacco Use  . Smoking status: Never Smoker  . Smokeless tobacco: Never Used  Substance Use Topics  . Alcohol use: No    Alcohol/week: 0.0 standard drinks  . Drug use: No    Family History  Problem Relation Age of Onset  . Thyroid cancer Mother   . Diabetes type II Mother   . High Cholesterol Mother   . Heart disease Mother   . Diabetes type II Father   . Hypertension Father   . Stroke Father     Allergies  Allergen Reactions  . Vancomycin Itching  . Cefuroxime Hives and Swelling    Unspecified location of swelling  . Cefuroxime Axetil Hives  . Sulfa Antibiotics Hives    Medication list has been reviewed and updated.  Current Outpatient Medications on File Prior to Visit  Medication Sig Dispense Refill  . acetaminophen (TYLENOL) 325 MG tablet Take 650 mg by mouth every 6 (six) hours as needed.    Marland Kitchen allopurinol (ZYLOPRIM) 300 MG tablet Take 1 tablet (300 mg total) by mouth daily. 90 tablet 3  . Blood Glucose Monitoring Suppl (BLOOD GLUCOSE METER KIT AND SUPPLIES) Dispense based on patient and insurance preference. To check blood sugars daily FOR ICD-9 250.00 1 each 0  . cetirizine (ZYRTEC) 10 MG tablet Take 10 mg by mouth daily as needed for allergies.     . cholestyramine (QUESTRAN) 4 g packet DISSOLVE & TAKE 1 POWDER BY MOUTH TWICE DAILY 60 each 0  . clobetasol cream (TEMOVATE) 1.77 % Apply 1 application topically daily as needed (for eczema).     . dicyclomine (BENTYL) 20 MG tablet Take 1 tablet (20 mg total) by mouth 4 (four) times daily -  before meals and at bedtime. (Patient taking differently: Take 20 mg by mouth 2 (two) times a day. ) 360 tablet 3  . empagliflozin (JARDIANCE) 25 MG TABS tablet Take 25 mg by mouth daily.    . Glucose Blood (BLOOD  GLUCOSE TEST STRIPS) STRP Use to test blood sugar up to twice a day 100 each 3  . levonorgestrel-ethinyl estradiol (SEASONALE,INTROVALE,JOLESSA) 0.15-0.03 MG tablet Take 1 tablet by mouth daily. 1 Package 4  . levothyroxine (SYNTHROID) 137 MCG tablet TAKE 1 TABLET BY MOUTH ONCE DAILY IN THE MORNING . APPOINTMENT REQUIRED FOR FUTURE REFILLS 90 tablet 1  . metFORMIN (GLUCOPHAGE) 1000 MG tablet Take 1,000 mg by mouth 2 (two) times daily.   2  . naproxen sodium (ALEVE) 220 MG tablet Take 220  mg by mouth.    . ONE TOUCH ULTRA TEST test strip USE TO CHECK BLOOD SUGAR ONCE DAILY (ICD CODE:25.00) 100 each 3  . Semaglutide,0.25 or 0.5MG/DOS, (OZEMPIC, 0.25 OR 0.5 MG/DOSE,) 2 MG/1.5ML SOPN Inject 0.5 mg into the skin once a week. Saturdays     No current facility-administered medications on file prior to visit.     Review of Systems:  As per HPI- otherwise negative.   Physical Examination: Vitals:   08/10/18 1330  BP: 108/70  Pulse: 94  Resp: 16  Temp: 98 F (36.7 C)  SpO2: 97%   Vitals:   08/10/18 1330  Weight: 234 lb (106.1 kg)  Height: 5' 4" (1.626 m)   Body mass index is 40.17 kg/m. Ideal Body Weight: Weight in (lb) to have BMI = 25: 145.3  GEN: WDWN, NAD, Non-toxic, A & O x 3, obese, looks well  HEENT: Atraumatic, Normocephalic. Neck supple. No masses, No LAD. Ears and Nose: No external deformity. CV: RRR, No M/G/R. No JVD. No thrill. No extra heart sounds. PULM: CTA B, no wheezes, crackles, rhonchi. No retractions. No resp. distress. No accessory muscle use. ABD: S, NT, ND, +BS. No rebound. No HSM. EXTR: No c/c/e NEURO Normal gait.  PSYCH: Normally interactive. Conversant. Not depressed or anxious appearing.  Calm demeanor.  Did foot exam today  EKG: SR,  no acute change noted from previous tracing.  However machine today indicates possible old infarct   Assessment and Plan:   ICD-10-CM   1. Chest discomfort  R07.89 EKG 12-Lead    Ambulatory referral to Cardiology  2.  Chronic midline low back pain without sciatica  M54.5 Ambulatory referral to Neurosurgery   G89.29    Here today for a follow-up visit Referral to neurosurgery for her chronic back issues. If any imaging is needed prior to scheduling I am glad to order.  However today they are in a bit of a hurry to make it to next MD appt this afternoon so did not do x-rays today.  Atypical CP.  No acute findings on EKG.  I was able to schedule her to see Dr. Raliegh Ip here at the [facility] cardiology tomorrow at 1:15 pm.  I called her dad Dr Ruben Reason and we discussed this plan  Follow-up: No follow-ups on file.  No orders of the defined types were placed in this encounter.  Orders Placed This Encounter  Procedures  . Ambulatory referral to Neurosurgery  . Ambulatory referral to Cardiology  . EKG 12-Lead    _0 @  Outpatient Encounter Medications as of 08/10/2018  Medication Sig  . acetaminophen (TYLENOL) 325 MG tablet Take 650 mg by mouth every 6 (six) hours as needed.  Marland Kitchen allopurinol (ZYLOPRIM) 300 MG tablet Take 1 tablet (300 mg total) by mouth daily.  . Blood Glucose Monitoring Suppl (BLOOD GLUCOSE METER KIT AND SUPPLIES) Dispense based on patient and insurance preference. To check blood sugars daily FOR ICD-9 250.00  . cetirizine (ZYRTEC) 10 MG tablet Take 10 mg by mouth daily as needed for allergies.   . cholestyramine (QUESTRAN) 4 g packet DISSOLVE & TAKE 1 POWDER BY MOUTH TWICE DAILY  . clobetasol cream (TEMOVATE) 8.88 % Apply 1 application topically daily as needed (for eczema).   . dicyclomine (BENTYL) 20 MG tablet Take 1 tablet (20 mg total) by mouth 4 (four) times daily -  before meals and at bedtime. (Patient taking differently: Take 20 mg by mouth 2 (two) times a day. )  . empagliflozin (  JARDIANCE) 25 MG TABS tablet Take 25 mg by mouth daily.  . Glucose Blood (BLOOD GLUCOSE TEST STRIPS) STRP Use to test blood sugar up to twice a day  . levonorgestrel-ethinyl estradiol  (SEASONALE,INTROVALE,JOLESSA) 0.15-0.03 MG tablet Take 1 tablet by mouth daily.  Marland Kitchen levothyroxine (SYNTHROID) 137 MCG tablet TAKE 1 TABLET BY MOUTH ONCE DAILY IN THE MORNING . APPOINTMENT REQUIRED FOR FUTURE REFILLS  . metFORMIN (GLUCOPHAGE) 1000 MG tablet Take 1,000 mg by mouth 2 (two) times daily.   . naproxen sodium (ALEVE) 220 MG tablet Take 220 mg by mouth.  . ONE TOUCH ULTRA TEST test strip USE TO CHECK BLOOD SUGAR ONCE DAILY (ICD CODE:25.00)  . Semaglutide,0.25 or 0.5MG/DOS, (OZEMPIC, 0.25 OR 0.5 MG/DOSE,) 2 MG/1.5ML SOPN Inject 0.5 mg into the skin once a week. Saturdays  . [DISCONTINUED] docusate sodium (COLACE) 100 MG capsule Take 1 capsule (100 mg total) by mouth 2 (two) times daily.  . [DISCONTINUED] ferrous sulfate (FERROUSUL) 325 (65 FE) MG tablet Take 1 tablet (325 mg total) by mouth 3 (three) times daily with meals.  . [DISCONTINUED] HYDROcodone-acetaminophen (NORCO) 7.5-325 MG tablet Take 1-2 tablets by mouth every 4 (four) hours as needed for moderate pain.  . [DISCONTINUED] methocarbamol (ROBAXIN) 500 MG tablet Take 1 tablet (500 mg total) by mouth every 6 (six) hours as needed for muscle spasms.  . [DISCONTINUED] Peppermint Oil (IBGARD) 90 MG CPCR Take 1 capsule by mouth daily as needed (IBS).  . [DISCONTINUED] polyethylene glycol (MIRALAX / GLYCOLAX) packet Take 17 g by mouth 2 (two) times daily.   No facility-administered encounter medications on file as of 08/10/2018.      Signed Lamar Blinks, MD

## 2018-08-10 ENCOUNTER — Encounter: Payer: Self-pay | Admitting: Family Medicine

## 2018-08-10 ENCOUNTER — Other Ambulatory Visit: Payer: Self-pay

## 2018-08-10 ENCOUNTER — Ambulatory Visit (INDEPENDENT_AMBULATORY_CARE_PROVIDER_SITE_OTHER): Payer: Medicare Other | Admitting: Family Medicine

## 2018-08-10 VITALS — BP 108/70 | HR 94 | Temp 98.0°F | Resp 16 | Ht 64.0 in | Wt 234.0 lb

## 2018-08-10 DIAGNOSIS — G8929 Other chronic pain: Secondary | ICD-10-CM | POA: Diagnosis not present

## 2018-08-10 DIAGNOSIS — R0789 Other chest pain: Secondary | ICD-10-CM

## 2018-08-10 DIAGNOSIS — M545 Low back pain, unspecified: Secondary | ICD-10-CM

## 2018-08-11 ENCOUNTER — Ambulatory Visit (INDEPENDENT_AMBULATORY_CARE_PROVIDER_SITE_OTHER): Payer: Medicare Other | Admitting: Cardiology

## 2018-08-11 ENCOUNTER — Encounter: Payer: Self-pay | Admitting: Cardiology

## 2018-08-11 ENCOUNTER — Other Ambulatory Visit: Payer: Medicare Other

## 2018-08-11 VITALS — BP 114/64 | HR 96 | Ht 64.0 in | Wt 233.8 lb

## 2018-08-11 DIAGNOSIS — Q909 Down syndrome, unspecified: Secondary | ICD-10-CM | POA: Diagnosis not present

## 2018-08-11 DIAGNOSIS — E039 Hypothyroidism, unspecified: Secondary | ICD-10-CM | POA: Diagnosis not present

## 2018-08-11 DIAGNOSIS — R072 Precordial pain: Secondary | ICD-10-CM

## 2018-08-11 DIAGNOSIS — R079 Chest pain, unspecified: Secondary | ICD-10-CM

## 2018-08-11 DIAGNOSIS — E1165 Type 2 diabetes mellitus with hyperglycemia: Secondary | ICD-10-CM

## 2018-08-11 MED ORDER — METOPROLOL TARTRATE 50 MG PO TABS: 50.0000 mg | ORAL_TABLET | Freq: Two times a day (BID) | ORAL | 0 refills | Status: DC

## 2018-08-11 NOTE — Patient Instructions (Addendum)
Medication Instructions:  Your physician recommends that you continue on your current medications as directed. Please refer to the Current Medication list given to you today.  If you need a refill on your cardiac medications before your next appointment, please call your pharmacy.   Lab work: Your physician recommends that you return for lab work in:   3-7 days prior to CT: BMP to check kidneys  If you have labs (blood work) drawn today and your tests are completely normal, you will receive your results only by: Marland Kitchen MyChart Message (if you have MyChart) OR . A paper copy in the mail If you have any lab test that is abnormal or we need to change your treatment, we will call you to review the results.  Testing/Procedures: Your physician has requested that you have cardiac CT. Cardiac computed tomography (CT) is a painless test that uses an x-ray machine to take clear, detailed pictures of your heart. For further information please visit [email or web address]. Please follow instruction sheet as given.  Please arrive at the [facility] main entrance of [facility] at xx:xx AM (30-45 minutes prior to test start time)  [facility] [place], [place] [address] [phone]  Proceed to the [facility] Radiology Department (First Floor).  Please follow these instructions carefully (unless otherwise directed):   On the Night Before the Test: . Be sure to Drink plenty of water. . Do not consume any caffeinated/decaffeinated beverages or chocolate 12 hours prior to your test. . Do not take any antihistamines 12 hours prior to your test. . If you take Metformin do not take 24 hours prior to test. . Hold Jardiance and Ozempic day of the test On the Day of the Test: . Drink plenty of water. Do not drink any water within one hour of the test. . Do not eat any food 4 hours prior to the test. . You may take your regular medications prior to the test.  . Take  metoprolol (Lopressor) two hours prior to test..                  -If HR is less than 55 BPM- No Beta Blocker                -IF HR is greater than 55 BPM and patient is less than or equal to 20 yrs old Lopressor 100mg  x1.       After the Test: . Drink plenty of water. . After receiving IV contrast, you may experience a mild flushed feeling. This is normal. . On occasion, you may experience a mild rash up to 24 hours after the test. This is not dangerous. If this occurs, you can take Benadryl 25 mg and increase your fluid intake. . If you experience trouble breathing, this can be serious. If it is severe call 911 IMMEDIATELY. If it is mild, please call our office. . If you take any of these medications: Glipizide/Metformin, Avandament, Glucavance, please do not take 48 hours after completing test.   Follow-Up: At [facility], you and your health needs are our priority.  As part of our continuing mission to provide you with exceptional heart care, we have created designated Provider Care Teams.  These Care Teams include your primary Cardiologist (physician) and Advanced Practice Providers (APPs -  Physician Assistants and Nurse Practitioners) who all work together to provide you with the care you need, when you need it. You will need a follow up appointment  in 6 weeks.  Any Other Special Instructions Will Be Listed Below (If Applicable).

## 2018-08-11 NOTE — Progress Notes (Signed)
Cardiology Office Note:    Date:  08/11/2018   ID:  Wanda Oneill, DOB 13-Nov-1980, MRN [id number]  PCP:  Darreld Mclean, MD  Cardiologist:  No primary care provider on file.  Electrophysiologist:  None   Referring MD: Darreld Mclean, MD   Chief Complaint  Patient presents with  . Chest Pain    For at least the past month, on the left side, has felt pain toward the neck and at the top of her shoulder   38 yo female presents for consult with chief complaint of chest pain at request of her PCP Dr. Lorelei Pont.   History of Present Illness:    Wanda Oneill is a 38 y.o. female with a hx of Down syndrome, DM2, hypothyroidism, obesity, chronic lower back pain seen today for consult of chest pain at the request of her PCP Dr. Lorelei Pont. Seen by Dr. Lorelei Pont yesterday and she noted 2 month history of chest discomfort, EKG at that visit personally reviewed by me with sinus rhythm with t-wave inversion in lead III stable when compared to previous 10/20/17.  She complained of having chest pain which he described as heavy sensation with radiation towards the left arm.  It happens sometimes when she walks.  She is moving to a different apartment that involve carrying a lot of heavy stuff and sometimes she will get this sensation in the chest.  There is sometimes shortness of breath with this sensation.  Takes usually few minutes for this to go away sometimes however after 1 hour.  At the moment of my interview she is asymptomatic.  Her father is accompanying her into our office he is a physician. Risk factors include diabetes, her last fasting lipid profile was good.  Past Medical History:  Diagnosis Date  . Diabetes mellitus    type II  . Down syndrome   . Family history of adverse reaction to anesthesia    mom has had n/v  . Gastritis   . GERD (gastroesophageal reflux disease)   . Headache   . Hepatic steatosis   . Hidradenitis   . Hypothyroidism   . Irritable bowel syndrome   .  Periumbilical hernia   . Pneumonia   . Restless legs   . Tendonitis    chronic in left foot  . Thyroid disease    hypothyroidism    Past Surgical History:  Procedure Laterality Date  . AXILLARY HIDRADENITIS EXCISION Bilateral   . CHOLECYSTECTOMY    . HERNIA REPAIR     multiple  . INCISIONAL HERNIA REPAIR  05/30/2015   LAPROSCOPIC  . INCISIONAL HERNIA REPAIR N/A 05/30/2015   Procedure: LAPAROSCOPIC INCISIONAL HERNIA;  Surgeon: Rolm Bookbinder, MD;  Location: [place];  Service: General;  Laterality: N/A;  . INSERTION OF MESH N/A 05/30/2015   Procedure: INSERTION OF MESH;  Surgeon: Rolm Bookbinder, MD;  Location: [place];  Service: General;  Laterality: N/A;  . KNEE ARTHROSCOPY     right knee  . LAPAROSCOPY N/A 05/30/2015   Procedure: LAPAROSCOPY DIAGNOSTIC;  Surgeon: Rolm Bookbinder, MD;  Location: [place];  Service: General;  Laterality: N/A;  . PARTIAL KNEE ARTHROPLASTY Right 10/30/2017   Procedure: UNICOMPARTMENTAL RIGHT KNEE LATERAL;  Surgeon: Paralee Cancel, MD;  Location: [facility];  Service: Orthopedics;  Laterality: Right;  90 mins  . TONSILLECTOMY     adenoids    Current Medications: Current Meds  Medication Sig  . acetaminophen (TYLENOL) 325 MG tablet Take 650 mg by mouth every 6 (  six) hours as needed.  Marland Kitchen allopurinol (ZYLOPRIM) 300 MG tablet Take 1 tablet (300 mg total) by mouth daily.  . Blood Glucose Monitoring Suppl (BLOOD GLUCOSE METER KIT AND SUPPLIES) Dispense based on patient and insurance preference. To check blood sugars daily FOR ICD-9 250.00  . cetirizine (ZYRTEC) 10 MG tablet Take 10 mg by mouth daily as needed for allergies.   . cholestyramine (QUESTRAN) 4 g packet DISSOLVE & TAKE 1 POWDER BY MOUTH TWICE DAILY  . clobetasol cream (TEMOVATE) 3.90 % Apply 1 application topically daily as needed (for eczema).   . dicyclomine (BENTYL) 20 MG tablet Take 1 tablet (20 mg total) by mouth 4 (four) times daily -  before meals and at bedtime. (Patient taking differently: Take  20 mg by mouth 2 (two) times a day. )  . empagliflozin (JARDIANCE) 25 MG TABS tablet Take 25 mg by mouth daily.  . Glucose Blood (BLOOD GLUCOSE TEST STRIPS) STRP Use to test blood sugar up to twice a day  . levonorgestrel-ethinyl estradiol (SEASONALE,INTROVALE,JOLESSA) 0.15-0.03 MG tablet Take 1 tablet by mouth daily.  Marland Kitchen levothyroxine (SYNTHROID) 137 MCG tablet TAKE 1 TABLET BY MOUTH ONCE DAILY IN THE MORNING . APPOINTMENT REQUIRED FOR FUTURE REFILLS  . metFORMIN (GLUCOPHAGE) 1000 MG tablet Take 1,000 mg by mouth 2 (two) times daily.   . naproxen sodium (ALEVE) 220 MG tablet Take 220 mg by mouth.  . ONE TOUCH ULTRA TEST test strip USE TO CHECK BLOOD SUGAR ONCE DAILY (ICD CODE:25.00)  . Semaglutide,0.25 or 0.5MG/DOS, (OZEMPIC, 0.25 OR 0.5 MG/DOSE,) 2 MG/1.5ML SOPN Inject 0.5 mg into the skin once a week. Saturdays     Allergies:   Vancomycin, Cefuroxime, Cefuroxime axetil, and Sulfa antibiotics   Social History   Socioeconomic History  . Marital status: Single    Spouse name: Not on file  . Number of children: Not on file  . Years of education: Not on file  . Highest education level: Not on file  Occupational History  . Occupation: disabled  Social Needs  . Financial resource strain: Not on file  . Food insecurity    Worry: Not on file    Inability: Not on file  . Transportation needs    Medical: Not on file    Non-medical: Not on file  Tobacco Use  . Smoking status: Never Smoker  . Smokeless tobacco: Never Used  Substance and Sexual Activity  . Alcohol use: No    Alcohol/week: 0.0 standard drinks  . Drug use: No  . Sexual activity: Yes    Birth control/protection: Pill  Lifestyle  . Physical activity    Days per week: Not on file    Minutes per session: Not on file  . Stress: Not on file  Relationships  . Social Herbalist on phone: Not on file    Gets together: Not on file    Attends religious service: Not on file    Active member of club or organization:  Not on file    Attends meetings of clubs or organizations: Not on file    Relationship status: Not on file  Other Topics Concern  . Not on file  Social History Narrative  . Not on file     Family History: The patient's family history includes Diabetes type II in her father and mother; Heart disease in her mother; High Cholesterol in her mother; Hypertension in her father; Stroke in her father; Thyroid cancer in her mother.  ROS:   Please  see the history of present illness.     All other systems reviewed and are negative.  EKGs/Labs/Other Studies Reviewed:    The following studies were reviewed today: EKG 08/28/2018 sinus rhythm without acute ST/T wave changes - noted t-wave inversion in lead III stable when compared to prevoius 10/20/17    Recent Labs: 11/01/2017: BUN 10; Creatinine, Ser 0.67; Hemoglobin 12.6; Platelets 283; Potassium 4.4; Sodium 135  Recent Lipid Panel No results found for: CHOL, TRIG, HDL, CHOLHDL, VLDL, LDLCALC, LDLDIRECT  Physical Exam:    VS:  BP 114/64   Pulse 96   Ht 5' 4"  (1.626 m)   Wt 233 lb 12.8 oz (106.1 kg)   SpO2 99%   BMI 40.13 kg/m     Wt Readings from Last 3 Encounters:  08/11/18 233 lb 12.8 oz (106.1 kg)  Aug 28, 2018 234 lb (106.1 kg)  10/30/17 237 lb 3.2 oz (107.6 kg)     GEN:  Well nourished, well developed in no acute distress HEENT: Normal NECK: No JVD; No carotid bruits LYMPHATICS: No lymphadenopathy CARDIAC: RRR, no murmurs, rubs, gallops RESPIRATORY:  Clear to auscultation without rales, wheezing or rhonchi  ABDOMEN: Soft, non-tender, non-distended MUSCULOSKELETAL:  No edema; No deformity  SKIN: Warm and dry NEUROLOGIC:  Alert and oriented x 3 PSYCHIATRIC:  Normal affect   ASSESSMENT:    1. Chest pain in adult   2. Uncontrolled type 2 diabetes mellitus with hyperglycemia ([place])   3. Acquired hypothyroidism   4. Down syndrome   5. Precordial chest pain    PLAN:    In order of problems listed above:  1. Chest pain -does  have some worrisome characteristics.  Of course in somebody with Down syndrome concern is about potentially anomalous coronary arteries.  I think the best test we can do trying to rule it out will be to do fractional flow reserve.  That will allow [place] to look at her coronary arteries as well as to rule out any potential obstruction.  I asked her not to exert herself to the point that she is getting chest pain.  I asked her to start taking one baby aspirin every single day. 2. DM2 - Followed by endocrinology. On SGLT2 which is of cardiac benefit.  3. Hypothyroidism - Followed by endocrinology. Most recent TSH by [facility] 12/2017 2.74.  4. Down syndrome -noted   Medication Adjustments/Labs and Tests Ordered: Current medicines are reviewed at length with the patient today.  Concerns regarding medicines are outlined above.  Orders Placed This Encounter  Procedures  . CT CORONARY FRACTIONAL FLOW RESERVE DATA PREP  . CT CORONARY FRACTIONAL FLOW RESERVE FLUID ANALYSIS  . CT CORONARY MORPH W/CTA COR W/SCORE W/CA W/CM &/OR WO/CM  . Basic Metabolic Panel (BMET)   Meds ordered this encounter  Medications  . metoprolol tartrate (LOPRESSOR) 50 MG tablet    Sig: Take 1 tablet (50 mg total) by mouth 2 (two) times daily.    Dispense:  2 tablet    Refill:  0    Patient Instructions  Medication Instructions:  Your physician recommends that you continue on your current medications as directed. Please refer to the Current Medication list given to you today.  If you need a refill on your cardiac medications before your next appointment, please call your pharmacy.   Lab work: Your physician recommends that you return for lab work in:   3-7 days prior to CT: BMP to check kidneys  If you have labs (blood work) drawn today  and your tests are completely normal, you will receive your results only by: Marland Kitchen MyChart Message (if you have MyChart) OR . A paper copy in the mail If you have any lab test that is abnormal  or we need to change your treatment, we will call you to review the results.  Testing/Procedures: Your physician has requested that you have cardiac CT. Cardiac computed tomography (CT) is a painless test that uses an x-ray machine to take clear, detailed pictures of your heart. For further information please visit [facility]uge[facility]tn. Please follow instruction sheet as given.  Please arrive at the [facility] main entrance of [facility] at xx:xx AM (30-45 minutes prior to test start time)  [facility] [place], [place] [address] [phone]  Proceed to the [facility] Radiology Department (First Floor).  Please follow these instructions carefully (unless otherwise directed):   On the Night Before the Test: . Be sure to Drink plenty of water. . Do not consume any caffeinated/decaffeinated beverages or chocolate 12 hours prior to your test. . Do not take any antihistamines 12 hours prior to your test. . If you take Metformin do not take 24 hours prior to test. . Hold Jardiance and Ozempic day of the test On the Day of the Test: . Drink plenty of water. Do not drink any water within one hour of the test. . Do not eat any food 4 hours prior to the test. . You may take your regular medications prior to the test.  . Take metoprolol (Lopressor) two hours prior to test..                  -If HR is less than 55 BPM- No Beta Blocker                -IF HR is greater than 55 BPM and patient is less than or equal to 39 yrs old Lopressor 167m x1.       After the Test: . Drink plenty of water. . After receiving IV contrast, you may experience a mild flushed feeling. This is normal. . On occasion, you may experience a mild rash up to 24 hours after the test. This is not dangerous. If this occurs, you can take Benadryl 25 mg and increase your fluid intake. . If you experience trouble breathing, this can be serious. If it is severe call 911 IMMEDIATELY. If  it is mild, please call our office. . If you take any of these medications: Glipizide/Metformin, Avandament, Glucavance, please do not take 48 hours after completing test.   Follow-Up: At [facility] you and your health needs are our priority.  As part of our continuing mission to provide you with exceptional heart care, we have created designated Provider Care Teams.  These Care Teams include your primary Cardiologist (physician) and Advanced Practice Providers (APPs -  Physician Assistants and Nurse Practitioners) who all work together to provide you with the care you need, when you need it. You will need a follow up appointment in 6 weeks.  Any Other Special Instructions Will Be Listed Below (If Applicable).       Signed, Dr. RJenne Campus 08/11/2018 2:44 PM    [place]

## 2018-08-17 ENCOUNTER — Other Ambulatory Visit: Payer: Self-pay | Admitting: Family Medicine

## 2018-08-30 ENCOUNTER — Other Ambulatory Visit: Payer: Self-pay | Admitting: Internal Medicine

## 2018-08-31 ENCOUNTER — Telehealth: Payer: Self-pay | Admitting: Internal Medicine

## 2018-08-31 MED ORDER — CHOLESTYRAMINE 4 G PO PACK: PACK | ORAL | 0 refills | Status: DC

## 2018-08-31 NOTE — Telephone Encounter (Signed)
Rx sent 

## 2018-08-31 NOTE — Telephone Encounter (Signed)
Pt is scheduled for OV with Janett Billow 8/21 and requested a refill for cholestyramine.

## 2018-09-02 ENCOUNTER — Other Ambulatory Visit: Payer: Self-pay | Admitting: Family Medicine

## 2018-09-02 DIAGNOSIS — Z5181 Encounter for therapeutic drug level monitoring: Secondary | ICD-10-CM

## 2018-09-08 ENCOUNTER — Other Ambulatory Visit (INDEPENDENT_AMBULATORY_CARE_PROVIDER_SITE_OTHER): Payer: Medicare Other

## 2018-09-08 ENCOUNTER — Other Ambulatory Visit: Payer: Self-pay

## 2018-09-08 DIAGNOSIS — Z5181 Encounter for therapeutic drug level monitoring: Secondary | ICD-10-CM | POA: Diagnosis not present

## 2018-09-09 ENCOUNTER — Encounter: Payer: Self-pay | Admitting: Family Medicine

## 2018-09-09 LAB — BASIC METABOLIC PANEL
BUN: 15 mg/dL (ref 6–23)
CO2: 25 mEq/L (ref 19–32)
Calcium: 9.6 mg/dL (ref 8.4–10.5)
Chloride: 99 mEq/L (ref 96–112)
Creatinine, Ser: 0.65 mg/dL (ref 0.40–1.20)
GFR: 101.62 mL/min (ref >60.00)
Glucose, Bld: 168 mg/dL — ABNORMAL HIGH (ref 70–99)
Potassium: 4.6 mEq/L (ref 3.5–5.1)
Sodium: 138 mEq/L (ref 135–145)

## 2018-09-11 ENCOUNTER — Telehealth (HOSPITAL_COMMUNITY): Payer: Self-pay | Admitting: Emergency Medicine

## 2018-09-11 NOTE — Telephone Encounter (Signed)
Reaching out to patient to offer assistance regarding upcoming cardiac imaging study; pt verbalizes understanding of appt date/time, parking situation and where to check in, pre-test NPO status and medications ordered, and verified current allergies; name and call back number provided for further questions should they arise Izumi Mixon RN Navigator Cardiac Imaging [facility] [phone] office [phone] cell 

## 2018-09-14 ENCOUNTER — Other Ambulatory Visit: Payer: Self-pay

## 2018-09-14 ENCOUNTER — Ambulatory Visit (HOSPITAL_COMMUNITY): Payer: Medicare Other

## 2018-09-14 ENCOUNTER — Ambulatory Visit (HOSPITAL_COMMUNITY)
Admission: RE | Admit: 2018-09-14 | Discharge: 2018-09-14 | Disposition: A | Discharge status: Home / Self Care | Payer: Medicare Other | Source: Ambulatory Visit | Attending: Cardiology | Admitting: Cardiology

## 2018-09-14 ENCOUNTER — Encounter: Payer: Medicare Other | Admitting: *Deleted

## 2018-09-14 DIAGNOSIS — R079 Chest pain, unspecified: Secondary | ICD-10-CM | POA: Insufficient documentation

## 2018-09-14 DIAGNOSIS — Z006 Encounter for examination for normal comparison and control in clinical research program: Secondary | ICD-10-CM

## 2018-09-14 DIAGNOSIS — R072 Precordial pain: Secondary | ICD-10-CM | POA: Insufficient documentation

## 2018-09-14 MED ORDER — NITROGLYCERIN 0.4 MG SL SUBL
SUBLINGUAL_TABLET | SUBLINGUAL | Status: AC
  Filled 2018-09-14: qty 2

## 2018-09-14 MED ORDER — METOPROLOL TARTRATE 5 MG/5ML IV SOLN
INTRAVENOUS | Status: AC
  Filled 2018-09-14: qty 5

## 2018-09-14 MED ORDER — IOHEXOL 350 MG/ML SOLN
100.0000 mL | Freq: Once | INTRAVENOUS | Status: AC | PRN
  Administered 2018-09-14: 100 mL via INTRAVENOUS

## 2018-09-14 MED ORDER — NITROGLYCERIN 0.4 MG SL SUBL
0.8000 mg | SUBLINGUAL_TABLET | Freq: Once | SUBLINGUAL | Status: AC
  Administered 2018-09-14: 0.8 mg via SUBLINGUAL
  Filled 2018-09-14: qty 25

## 2018-09-14 MED ORDER — METOPROLOL TARTRATE 5 MG/5ML IV SOLN
5.0000 mg | INTRAVENOUS | Status: DC | PRN
  Administered 2018-09-14 (×2): 5 mg via INTRAVENOUS
  Filled 2018-09-14 (×3): qty 5

## 2018-09-14 NOTE — Research (Signed)
CADFEM Informed Consent                  Subject Name:   Wanda Oneill   Subject met inclusion and exclusion criteria.  The informed consent form, study requirements and expectations were reviewed with the subject and questions and concerns were addressed prior to the signing of the consent form.  The subject verbalized understanding of the trial requirements.  The subject agreed to participate in the CADFEM trial and signed the informed consent.  The informed consent was obtained prior to performance of any protocol-specific procedures for the subject.  A copy of the signed informed consent was given to the subject and a copy was placed in the subject's medical record.   [place] Natajah Derderian, Research Assistant  09/13/08/2020 08:34 a.m.

## 2018-09-22 ENCOUNTER — Ambulatory Visit (INDEPENDENT_AMBULATORY_CARE_PROVIDER_SITE_OTHER): Payer: Medicare Other | Admitting: Cardiology

## 2018-09-22 ENCOUNTER — Encounter: Payer: Self-pay | Admitting: Cardiology

## 2018-09-22 ENCOUNTER — Other Ambulatory Visit: Payer: Self-pay

## 2018-09-22 VITALS — BP 110/64 | HR 80 | Resp 10 | Ht 64.0 in | Wt 239.0 lb

## 2018-09-22 DIAGNOSIS — R072 Precordial pain: Secondary | ICD-10-CM | POA: Diagnosis not present

## 2018-09-22 DIAGNOSIS — Q909 Down syndrome, unspecified: Secondary | ICD-10-CM

## 2018-09-22 DIAGNOSIS — E1165 Type 2 diabetes mellitus with hyperglycemia: Secondary | ICD-10-CM | POA: Diagnosis not present

## 2018-09-22 MED ORDER — ASPIRIN EC 81 MG PO TBEC: 81.0000 mg | DELAYED_RELEASE_TABLET | Freq: Every day | ORAL | 3 refills | Status: DC

## 2018-09-22 NOTE — Progress Notes (Signed)
Patient's mother states PCP Dr. Lorelei Pont put Wanda Oneill on 81mg  ASA, wonders if [name] should continue to take that. Dr. Agustin Cree advises she continue to take ASA 81mg  daily.

## 2018-09-22 NOTE — Progress Notes (Signed)
Cardiology Office Note:    Date:  09/22/2018   ID:  Wanda Oneill, DOB [birth year], MRN [id number]  PCP:  Darreld Mclean, MD  Cardiologist:  Jenne Campus, MD    Referring MD: Darreld Mclean, MD   Chief Complaint  Patient presents with  . 6 week up follow up  Still having some chest pain  History of Present Illness:    Wanda Oneill is a 38 y.o. female who does have a Down syndrome, also hyper glycemia with diabetes.  She was referred to [place] because of atypical precordial chest pain.  Pain happened without exercise and is slightly worse with moving her shoulder.  We decided to proceed with CT angiogram to look at the morphology of her coronary arteries as well as to look for any evidence of calcium.  Results came no abnormality of coronary arteries, no calcium in her coronary arteries.  Therefore, likelihood of significant narrowing of her coronary arteries is very low.  On top of that today I will talk more about this pain.  Her mother was with [place] in the room and she brought some additional information to her symptomatology.  Patient described to have some pain worse when she move her left shoulder, on top of that she always carries back on her left shoulder.  On top of that she was discovered to have significant problem with her low lumbar spine.  Anymore she may be having some problem with cervical spine as well.  Regardless we decided to continue modification of her risk factors for coronary artery disease namely controlling her diabetes better.  Also trying to be active.  She will see a neurologist for her back.  She will not carry a parous and back on her left shoulder we did talk about some exercises.  Past Medical History:  Diagnosis Date  . Diabetes mellitus    type II  . Down syndrome   . Family history of adverse reaction to anesthesia    mom has had n/v  . Gastritis   . GERD (gastroesophageal reflux disease)   . Headache   . Hepatic steatosis   . Hidradenitis    . Hypothyroidism   . Irritable bowel syndrome   . Periumbilical hernia   . Pneumonia   . Restless legs   . Tendonitis    chronic in left foot  . Thyroid disease    hypothyroidism    Past Surgical History:  Procedure Laterality Date  . AXILLARY HIDRADENITIS EXCISION Bilateral   . CHOLECYSTECTOMY    . HERNIA REPAIR     multiple  . INCISIONAL HERNIA REPAIR  05/30/2015   LAPROSCOPIC  . INCISIONAL HERNIA REPAIR N/A 05/30/2015   Procedure: LAPAROSCOPIC INCISIONAL HERNIA;  Surgeon: Rolm Bookbinder, MD;  Location: [place];  Service: General;  Laterality: N/A;  . INSERTION OF MESH N/A 05/30/2015   Procedure: INSERTION OF MESH;  Surgeon: Rolm Bookbinder, MD;  Location: [place];  Service: General;  Laterality: N/A;  . KNEE ARTHROSCOPY     right knee  . LAPAROSCOPY N/A 05/30/2015   Procedure: LAPAROSCOPY DIAGNOSTIC;  Surgeon: Rolm Bookbinder, MD;  Location: [place];  Service: General;  Laterality: N/A;  . PARTIAL KNEE ARTHROPLASTY Right 10/30/2017   Procedure: UNICOMPARTMENTAL RIGHT KNEE LATERAL;  Surgeon: Paralee Cancel, MD;  Location[facility];  Service: Orthopedics;  Laterality: Right;  90 mins  . TONSILLECTOMY     adenoids    Current Medications: Current Meds  Medication Sig  . acetaminophen (TYLENOL)  325 MG tablet Take 650 mg by mouth every 6 (six) hours as needed.  Marland Kitchen allopurinol (ZYLOPRIM) 300 MG tablet Take 1 tablet (300 mg total) by mouth daily.  . Blood Glucose Monitoring Suppl (BLOOD GLUCOSE METER KIT AND SUPPLIES) Dispense based on patient and insurance preference. To check blood sugars daily FOR ICD-9 250.00  . cetirizine (ZYRTEC) 10 MG tablet Take 10 mg by mouth daily as needed for allergies.   . cholestyramine (QUESTRAN) 4 g packet DISSOLVE & TAKE 1 POWDER BY MOUTH TWICE DAILY  . clobetasol cream (TEMOVATE) 9.73 % Apply 1 application topically daily as needed (for eczema).   . dicyclomine (BENTYL) 20 MG tablet Take 1 tablet (20 mg total) by mouth 4 (four) times daily -  before  meals and at bedtime. (Patient taking differently: Take 20 mg by mouth 2 (two) times a day. )  . empagliflozin (JARDIANCE) 25 MG TABS tablet Take 25 mg by mouth daily.  . Glucose Blood (BLOOD GLUCOSE TEST STRIPS) STRP Use to test blood sugar up to twice a day  . JOLESSA 0.15-0.03 MG tablet Take 1 tablet by mouth once daily  . levothyroxine (SYNTHROID) 137 MCG tablet TAKE 1 TABLET BY MOUTH ONCE DAILY IN THE MORNING . APPOINTMENT REQUIRED FOR FUTURE REFILLS  . metFORMIN (GLUCOPHAGE) 1000 MG tablet Take 1,000 mg by mouth 2 (two) times daily.   . naproxen sodium (ALEVE) 220 MG tablet Take 220 mg by mouth.  . ONE TOUCH ULTRA TEST test strip USE TO CHECK BLOOD SUGAR ONCE DAILY (ICD CODE:25.00)  . Semaglutide,0.25 or 0.5MG/DOS, (OZEMPIC, 0.25 OR 0.5 MG/DOSE,) 2 MG/1.5ML SOPN Inject 0.5 mg into the skin once a week. Saturdays     Allergies:   Vancomycin, Cefuroxime, Cefuroxime axetil, and Sulfa antibiotics   Social History   Socioeconomic History  . Marital status: Single    Spouse name: Not on file  . Number of children: Not on file  . Years of education: Not on file  . Highest education level: Not on file  Occupational History  . Occupation: disabled  Social Needs  . Financial resource strain: Not on file  . Food insecurity    Worry: Not on file    Inability: Not on file  . Transportation needs    Medical: Not on file    Non-medical: Not on file  Tobacco Use  . Smoking status: Never Smoker  . Smokeless tobacco: Never Used  Substance and Sexual Activity  . Alcohol use: No    Alcohol/week: 0.0 standard drinks  . Drug use: No  . Sexual activity: Yes    Birth control/protection: Pill  Lifestyle  . Physical activity    Days per week: Not on file    Minutes per session: Not on file  . Stress: Not on file  Relationships  . Social Herbalist on phone: Not on file    Gets together: Not on file    Attends religious service: Not on file    Active member of club or  organization: Not on file    Attends meetings of clubs or organizations: Not on file    Relationship status: Not on file  Other Topics Concern  . Not on file  Social History Narrative  . Not on file     Family History: The patient's family history includes Diabetes type II in her father and mother; Heart disease in her mother; High Cholesterol in her mother; Hypertension in her father; Stroke in her father; Thyroid  cancer in her mother. ROS:   Please see the history of present illness.    All 14 point review of systems negative except as described per history of present illness  EKGs/Labs/Other Studies Reviewed:      Recent Labs: 11/01/2017: Hemoglobin 12.6; Platelets 283 09/08/2018: BUN 15; Creatinine, Ser 0.65; Potassium 4.6; Sodium 138  Recent Lipid Panel No results found for: CHOL, TRIG, HDL, CHOLHDL, VLDL, LDLCALC, LDLDIRECT  Physical Exam:    VS:  BP 110/64   Pulse 80   Ht '5\' 4"'  (1.626 m)   Wt 239 lb (108.4 kg)   SpO2 (!) 10%   BMI 41.02 kg/m     Wt Readings from Last 3 Encounters:  09/22/18 239 lb (108.4 kg)  08/11/18 233 lb 12.8 oz (106.1 kg)  08/10/18 234 lb (106.1 kg)     GEN:  Well nourished, well developed in no acute distress HEENT: Normal NECK: No JVD; No carotid bruits LYMPHATICS: No lymphadenopathy CARDIAC: RRR, no murmurs, no rubs, no gallops RESPIRATORY:  Clear to auscultation without rales, wheezing or rhonchi  ABDOMEN: Soft, non-tender, non-distended MUSCULOSKELETAL:  No edema; No deformity  SKIN: Warm and dry LOWER EXTREMITIES: no swelling NEUROLOGIC:  Alert and oriented x 3 PSYCHIATRIC:  Normal affect   ASSESSMENT:    1. Precordial chest pain   2. Down syndrome   3. Uncontrolled type 2 diabetes mellitus with hyperglycemia ([facility])    PLAN:    In order of problems listed above:  1. Precordial chest pain.  Calcium score 0.  No anomaly of her coronary arteries.  Will continue monitoring the situation. 2. Down syndrome.  Noted she is doing  quite well and functional 3. Type 2 diabetes: Still poorly controlled she understands she need to improve it.   Medication Adjustments/Labs and Tests Ordered: Current medicines are reviewed at length with the patient today.  Concerns regarding medicines are outlined above.  No orders of the defined types were placed in this encounter.  Medication changes: No orders of the defined types were placed in this encounter.   Signed, Park Liter, MD, [facility] 09/22/2018 2:57 PM    [place]

## 2018-09-22 NOTE — Patient Instructions (Signed)
Medication Instructions:  Your physician recommends that you continue on your current medications as directed. Please refer to the Current Medication list given to you today.  If you need a refill on your cardiac medications before your next appointment, please call your pharmacy.   Lab work: NONE  If you have labs (blood work) drawn today and your tests are completely normal, you will receive your results only by: . MyChart Message (if you have MyChart) OR . A paper copy in the mail If you have any lab test that is abnormal or we need to change your treatment, we will call you to review the results.  Testing/Procedures: NONE  Follow-Up: At [facility], you and your health needs are our priority.  As part of our continuing mission to provide you with exceptional heart care, we have created designated Provider Care Teams.  These Care Teams include your primary Cardiologist (physician) and Advanced Practice Providers (APPs -  Physician Assistants and Nurse Practitioners) who all work together to provide you with the care you need, when you need it. You will need a follow up appointment in 3 months.  Please call our office 2 months in advance to schedule this appointment.  You may see No primary care provider on file. or another member of our [facility] Provider Team in [place]: Brian Munley, MD . Rajan Revankar, MD  Any Other Special Instructions Will Be Listed Below (If Applicable).    

## 2018-09-23 ENCOUNTER — Ambulatory Visit (INDEPENDENT_AMBULATORY_CARE_PROVIDER_SITE_OTHER): Payer: Medicare Other | Admitting: Sports Medicine

## 2018-09-23 ENCOUNTER — Encounter: Payer: Self-pay | Admitting: Sports Medicine

## 2018-09-23 ENCOUNTER — Ambulatory Visit (INDEPENDENT_AMBULATORY_CARE_PROVIDER_SITE_OTHER): Payer: Medicare Other

## 2018-09-23 DIAGNOSIS — M19012 Primary osteoarthritis, left shoulder: Secondary | ICD-10-CM

## 2018-09-23 NOTE — Assessment & Plan Note (Signed)
Persistently painful and waking her from sleep in spite of naproxen. Ultrasound-guided acromioclavicular injection as above, updated x-rays. Formal physical therapy at [facility]. Return to see me in 1 month.

## 2018-09-23 NOTE — Progress Notes (Signed)
Subjective:    CC: Left shoulder pain  HPI:  This is a pleasant 38 year old female, for several days now she has had pain that she localizes over her left shoulder, directly at the acromioclavicular joint, worse with overhead activities and reaching across her chest.  She did have an x-ray back in 2015 that showed some acromioclavicular degenerative changes, she has been taking Aleve without much improvement, pain is moderate, persistent, localized without radiation.  I reviewed the past medical history, family history, social history, surgical history, and allergies today and no changes were needed.  Please see the problem list section below in epic for further details.  Past Medical History: Past Medical History:  Diagnosis Date  . Diabetes mellitus    type II  . Down syndrome   . Family history of adverse reaction to anesthesia    mom has had n/v  . Gastritis   . GERD (gastroesophageal reflux disease)   . Headache   . Hepatic steatosis   . Hidradenitis   . Hypothyroidism   . Irritable bowel syndrome   . Periumbilical hernia   . Pneumonia   . Restless legs   . Tendonitis    chronic in left foot  . Thyroid disease    hypothyroidism   Past Surgical History: Past Surgical History:  Procedure Laterality Date  . AXILLARY HIDRADENITIS EXCISION Bilateral   . CHOLECYSTECTOMY    . HERNIA REPAIR     multiple  . INCISIONAL HERNIA REPAIR  05/30/2015   LAPROSCOPIC  . INCISIONAL HERNIA REPAIR N/A 05/30/2015   Procedure: LAPAROSCOPIC INCISIONAL HERNIA;  Surgeon: Rolm Bookbinder, MD;  Location: [facility];  Service: General;  Laterality: N/A;  . INSERTION OF MESH N/A 05/30/2015   Procedure: INSERTION OF MESH;  Surgeon: Rolm Bookbinder, MD;  Location: [facility];  Service: General;  Laterality: N/A;  . KNEE ARTHROSCOPY     right knee  . LAPAROSCOPY N/A 05/30/2015   Procedure: LAPAROSCOPY DIAGNOSTIC;  Surgeon: Rolm Bookbinder, MD;  Location: [facility];  Service: General;  Laterality: N/A;  .  PARTIAL KNEE ARTHROPLASTY Right 10/30/2017   Procedure: UNICOMPARTMENTAL RIGHT KNEE LATERAL;  Surgeon: Paralee Cancel, MD;  Location: [facility];  Service: Orthopedics;  Laterality: Right;  90 mins  . TONSILLECTOMY     adenoids   Social History: Social History   Socioeconomic History  . Marital status: Single    Spouse name: Not on file  . Number of children: Not on file  . Years of education: Not on file  . Highest education level: Not on file  Occupational History  . Occupation: disabled  Social Needs  . Financial resource strain: Not on file  . Food insecurity    Worry: Not on file    Inability: Not on file  . Transportation needs    Medical: Not on file    Non-medical: Not on file  Tobacco Use  . Smoking status: Never Smoker  . Smokeless tobacco: Never Used  Substance and Sexual Activity  . Alcohol use: No    Alcohol/week: 0.0 standard drinks  . Drug use: No  . Sexual activity: Yes    Birth control/protection: Pill  Lifestyle  . Physical activity    Days per week: Not on file    Minutes per session: Not on file  . Stress: Not on file  Relationships  . Social Herbalist on phone: Not on file    Gets together: Not on file    Attends religious service: Not  on file    Active member of club or organization: Not on file    Attends meetings of clubs or organizations: Not on file    Relationship status: Not on file  Other Topics Concern  . Not on file  Social History Narrative  . Not on file   Family History: Family History  Problem Relation Age of Onset  . Thyroid cancer Mother   . Diabetes type II Mother   . High Cholesterol Mother   . Heart disease Mother   . Diabetes type II Father   . Hypertension Father   . Stroke Father    Allergies: Allergies  Allergen Reactions  . Vancomycin Itching  . Cefuroxime Hives and Swelling    Unspecified location of swelling  . Cefuroxime Axetil Hives  . Sulfa Antibiotics Hives   Medications: See med rec.   Review of Systems: No headache, visual changes, nausea, vomiting, diarrhea, constipation, dizziness, abdominal pain, skin rash, fevers, chills, night sweats, swollen lymph nodes, weight loss, chest pain, body aches, joint swelling, muscle aches, shortness of breath, mood changes, visual or auditory hallucinations.  Objective:    General: Well Developed, well nourished, and in no acute distress.  Neuro: Alert and oriented x3, extra-ocular muscles intact, sensation grossly intact.  HEENT: Normocephalic, atraumatic, pupils equal round reactive to light, neck supple, no masses, no lymphadenopathy, thyroid nonpalpable.  Skin: Warm and dry, no rashes noted.  Cardiac: Regular rate and rhythm, no murmurs rubs or gallops.  Respiratory: Clear to auscultation bilaterally. Not using accessory muscles, speaking in full sentences.  Abdominal: Soft, nontender, nondistended, positive bowel sounds, no masses, no organomegaly.  Left shoulder: Inspection reveals no abnormalities, atrophy or asymmetry. Tender to palpation over the acromioclavicular joint with a positive crossarm sign. ROM is full in all planes. Rotator cuff strength normal throughout. No signs of impingement with negative Neer and Hawkin's tests, empty can. Speeds and Yergason's tests normal. No labral pathology noted with negative Obrien's, negative crank, negative clunk, and good stability. Normal scapular function observed. No painful arc and no drop arm sign. No apprehension sign  Procedure: Real-time Ultrasound Guided injection of the left acromioclavicular joint Device: GE Logiq E  Verbal informed consent obtained.  Time-out conducted.  Noted no overlying erythema, induration, or other signs of local infection.  Skin prepped in a sterile fashion.  Local anesthesia: Topical Ethyl chloride.  With sterile technique and under real time ultrasound guidance:  1 cc Kenalog 40, 1 cc lidocaine injected easily Completed without difficulty   Pain immediately resolved suggesting accurate placement of the medication.  Advised to call if fevers/chills, erythema, induration, drainage, or persistent bleeding.  Images permanently stored and available for review in the ultrasound unit.  Impression: Technically successful ultrasound guided injection.  X-rays personally reviewed, she has acromioclavicular degenerative joint disease, she also has some supraspinatus calcific tendinopathy.  Impression and Recommendations:    The patient was counselled, risk factors were discussed, anticipatory guidance given.  Arthritis of left acromioclavicular joint Persistently painful and waking her from sleep in spite of naproxen. Ultrasound-guided acromioclavicular injection as above, updated x-rays. Formal physical therapy at [facility]. Return to see me in 1 month.   ___________________________________________ Gwen Her. Dianah Field, M.D., ABFM., CAQSM. Primary Care and Sports Medicine [facility]  Adjunct Professor of [employer]

## 2018-09-25 ENCOUNTER — Ambulatory Visit (INDEPENDENT_AMBULATORY_CARE_PROVIDER_SITE_OTHER): Payer: Medicare Other | Admitting: Gastroenterology

## 2018-09-25 ENCOUNTER — Encounter: Payer: Self-pay | Admitting: Gastroenterology

## 2018-09-25 VITALS — BP 100/64 | HR 83 | Temp 98.4°F | Ht 64.0 in | Wt 236.0 lb

## 2018-09-25 DIAGNOSIS — K9089 Other intestinal malabsorption: Secondary | ICD-10-CM | POA: Diagnosis not present

## 2018-09-25 MED ORDER — CHOLESTYRAMINE 4 G PO PACK: PACK | ORAL | 3 refills | Status: DC

## 2018-09-25 NOTE — Progress Notes (Signed)
09/25/2018 Wanda Oneill [id number] 08/20/1980   HISTORY OF PRESENT ILLNESS: This is a 38 year old female who is a patient of Dr. Vena Rua.  She is here today with her mom for follow-up of bile salt induced diarrhea.  She had the IBcause study performed, which indicated bile salt induced diarrhea as a cause of her symptoms.  Also may have some IBS component as well.  She has been taking Questran 1 packet twice daily and has had amazing results with that.  Her mother indicates that is been life-changing.  She also uses occasional Bentyl as needed as well.  Says that if she eats very fried or fatty/greasy foods then sometimes she will still have diarrhea so that is when she uses the Bentyl.  She needs medication refill the Questran.   Past Medical History:  Diagnosis Date   Diabetes mellitus    type II   Down syndrome    Family history of adverse reaction to anesthesia    mom has had n/v   Gastritis    GERD (gastroesophageal reflux disease)    Headache    Hepatic steatosis    Hidradenitis    Hypothyroidism    Irritable bowel syndrome    Periumbilical hernia    Pneumonia    Restless legs    Tendonitis    chronic in left foot   Thyroid disease    hypothyroidism   Past Surgical History:  Procedure Laterality Date   AXILLARY HIDRADENITIS EXCISION Bilateral    CHOLECYSTECTOMY     HERNIA REPAIR     multiple   INCISIONAL HERNIA REPAIR  05/30/2015   LAPROSCOPIC   INCISIONAL HERNIA REPAIR N/A 05/30/2015   Procedure: LAPAROSCOPIC INCISIONAL HERNIA;  Surgeon: Rolm Bookbinder, MD;  Location: [facility];  Service: General;  Laterality: N/A;   INSERTION OF MESH N/A 05/30/2015   Procedure: INSERTION OF MESH;  Surgeon: Rolm Bookbinder, MD;  Location: [facility];  Service: General;  Laterality: N/A;   KNEE ARTHROSCOPY     right knee   LAPAROSCOPY N/A 05/30/2015   Procedure: LAPAROSCOPY DIAGNOSTIC;  Surgeon: Rolm Bookbinder, MD;  Location: [facility];  Service: General;   Laterality: N/A;   PARTIAL KNEE ARTHROPLASTY Right 10/30/2017   Procedure: UNICOMPARTMENTAL RIGHT KNEE LATERAL;  Surgeon: Paralee Cancel, MD;  Location[facility];  Service: Orthopedics;  Laterality: Right;  90 mins   TONSILLECTOMY     adenoids    reports that she has never smoked. She has never used smokeless tobacco. She reports that she does not drink alcohol or use drugs. family history includes Diabetes type II in her father and mother; Heart disease in her mother; High Cholesterol in her mother; Hypertension in her father; Stroke in her father; Thyroid cancer in her mother. Allergies  Allergen Reactions   Vancomycin Itching   Cefuroxime Hives and Swelling    Unspecified location of swelling   Cefuroxime Axetil Hives   Sulfa Antibiotics Hives      Outpatient Encounter Medications as of 09/25/2018  Medication Sig   acetaminophen (TYLENOL) 325 MG tablet Take 650 mg by mouth every 6 (six) hours as needed.   allopurinol (ZYLOPRIM) 300 MG tablet Take 1 tablet (300 mg total) by mouth daily.   aspirin EC 81 MG tablet Take 1 tablet (81 mg total) by mouth daily.   Blood Glucose Monitoring Suppl (BLOOD GLUCOSE METER KIT AND SUPPLIES) Dispense based on patient and insurance preference. To check blood sugars daily FOR ICD-9 250.00   cetirizine (ZYRTEC)  10 MG tablet Take 10 mg by mouth daily as needed for allergies.    cholestyramine (QUESTRAN) 4 g packet DISSOLVE & TAKE 1 POWDER BY MOUTH TWICE DAILY   clobetasol cream (TEMOVATE) 6.72 % Apply 1 application topically daily as needed (for eczema).    dicyclomine (BENTYL) 20 MG tablet Take 1 tablet (20 mg total) by mouth 4 (four) times daily -  before meals and at bedtime. (Patient taking differently: Take 20 mg by mouth 2 (two) times a day. )   empagliflozin (JARDIANCE) 25 MG TABS tablet Take 25 mg by mouth daily.   Glucose Blood (BLOOD GLUCOSE TEST STRIPS) STRP Use to test blood sugar up to twice a day   JOLESSA 0.15-0.03 MG tablet  Take 1 tablet by mouth once daily   levothyroxine (SYNTHROID) 137 MCG tablet TAKE 1 TABLET BY MOUTH ONCE DAILY IN THE MORNING . APPOINTMENT REQUIRED FOR FUTURE REFILLS   metFORMIN (GLUCOPHAGE) 1000 MG tablet Take 1,000 mg by mouth 2 (two) times daily.    naproxen sodium (ALEVE) 220 MG tablet Take 220 mg by mouth.   ONE TOUCH ULTRA TEST test strip USE TO CHECK BLOOD SUGAR ONCE DAILY (ICD CODE:25.00)   Semaglutide,0.25 or 0.5MG/DOS, (OZEMPIC, 0.25 OR 0.5 MG/DOSE,) 2 MG/1.5ML SOPN Inject 0.5 mg into the skin once a week. Saturdays   [DISCONTINUED] cholestyramine (QUESTRAN) 4 g packet DISSOLVE & TAKE 1 POWDER BY MOUTH TWICE DAILY   No facility-administered encounter medications on file as of 09/25/2018.      REVIEW OF SYSTEMS  : All other systems reviewed and negative except where noted in the History of Present Illness.   PHYSICAL EXAM: BP 100/64    Pulse 83    Temp 98.4 F (36.9 C)    Ht _0  (1.626 m)    Wt 236 lb (107 kg)    BMI 40.51 kg/m  General: Well developed white female in no acute distress Head: Normocephalic and atraumatic Eyes:  Sclerae anicteric, conjunctiva pink. Ears: Normal auditory acuity Lungs: Clear throughout to auscultation; no increased WOB. Heart: Regular rate and rhythm; no M/R/G Abdomen: Soft, non-distended.  BS present.  Mild diffuse TTP. Musculoskeletal: Symmetrical with no gross deformities  Skin: No lesions on visible extremities Extremities: No edema  Neurological: Alert oriented x 4, grossly non-focal Psychological:  Alert and cooperative. Normal mood and affect  ASSESSMENT AND PLAN: *38 year old female with bile salt related diarrhea and possibly some IBS component as well.  Symptoms well controlled on Questran 2 packs daily.  Also uses Bentyl as needed.  Very satisfied with results.  Her mother says it has been life-changing.  Needs refills on cholestyramine.  Will send prescription for this.   CC:  Copland, Gay Filler, MD

## 2018-09-25 NOTE — Patient Instructions (Signed)
We have sent the following medications to your pharmacy for you to pick up at your convenience: Cholestyramine     If you are age 39 or younger, your body mass index should be between 19-25. Your Body mass index is 40.51 kg/m. If this is out of the aformentioned range listed, please consider follow up with your Primary Care Provider.    Thank you for choosing me and [facility].  Janett Billow Zehr-PA

## 2018-09-29 ENCOUNTER — Other Ambulatory Visit: Payer: Self-pay

## 2018-09-29 ENCOUNTER — Ambulatory Visit: Payer: Medicare Other | Attending: Sports Medicine | Admitting: Physical Therapy

## 2018-09-29 ENCOUNTER — Encounter: Payer: Self-pay | Admitting: Physical Therapy

## 2018-09-29 ENCOUNTER — Ambulatory Visit: Payer: Medicare Other | Admitting: Cardiology

## 2018-09-29 DIAGNOSIS — M25512 Pain in left shoulder: Secondary | ICD-10-CM | POA: Diagnosis present

## 2018-09-29 DIAGNOSIS — R262 Difficulty in walking, not elsewhere classified: Secondary | ICD-10-CM | POA: Diagnosis present

## 2018-09-29 DIAGNOSIS — M62830 Muscle spasm of back: Secondary | ICD-10-CM | POA: Insufficient documentation

## 2018-09-29 DIAGNOSIS — M545 Low back pain, unspecified: Secondary | ICD-10-CM

## 2018-09-29 NOTE — Therapy (Signed)
[address] [place] [place], [place], [address] Phone: [phone]   Fax:  [phone]  Physical Therapy Evaluation  Patient Details  Name: Wanda Oneill MRN: [id number] Date of Birth: 12/20/1980 Referring Provider (PT): Dr. Darene Lamer   Encounter Date: 09/29/2018  PT End of Session - 09/29/18 1548    Visit Number  1    Date for PT Re-Evaluation  11/29/18    Authorization Type  Medicare    PT Start Time  1440    PT Stop Time  1530    PT Time Calculation (min)  50 min    Activity Tolerance  Patient tolerated treatment well    Behavior During Therapy  [facility] for tasks assessed/performed       Past Medical History:  Diagnosis Date  . Diabetes mellitus    type II  . Down syndrome   . Family history of adverse reaction to anesthesia    mom has had n/v  . Gastritis   . GERD (gastroesophageal reflux disease)   . Headache   . Hepatic steatosis   . Hidradenitis   . Hypothyroidism   . Irritable bowel syndrome   . Periumbilical hernia   . Pneumonia   . Restless legs   . Tendonitis    chronic in left foot  . Thyroid disease    hypothyroidism    Past Surgical History:  Procedure Laterality Date  . AXILLARY HIDRADENITIS EXCISION Bilateral   . CHOLECYSTECTOMY    . HERNIA REPAIR     multiple  . INCISIONAL HERNIA REPAIR  05/30/2015   LAPROSCOPIC  . INCISIONAL HERNIA REPAIR N/A 05/30/2015   Procedure: LAPAROSCOPIC INCISIONAL HERNIA;  Surgeon: Rolm Bookbinder, MD;  Location: [facility];  Service: General;  Laterality: N/A;  . INSERTION OF MESH N/A 05/30/2015   Procedure: INSERTION OF MESH;  Surgeon: Rolm Bookbinder, MD;  Location: [facility];  Service: General;  Laterality: N/A;  . KNEE ARTHROSCOPY     right knee  . LAPAROSCOPY N/A 05/30/2015   Procedure: LAPAROSCOPY DIAGNOSTIC;  Surgeon: Rolm Bookbinder, MD;  Location: [facility];  Service: General;  Laterality: N/A;  . PARTIAL KNEE ARTHROPLASTY Right 10/30/2017   Procedure:  UNICOMPARTMENTAL RIGHT KNEE LATERAL;  Surgeon: Paralee Cancel, MD;  Location[facility];  Service: Orthopedics;  Laterality: Right;  90 mins  . TONSILLECTOMY     adenoids    There were no vitals filed for this visit.   Subjective Assessment - 09/29/18 1448    Subjective  Patient has referral for left shoulder pain and low back pain.  She reports very bad shoulder pain possibly from taking care of small children.  Had an injection and the pain got much better.Patient reports that she has had a long term back issues, MRI shows DDD, stenosis.  Reports that she can funtion but there are times that she really hurts and is "disabled"    Patient is accompained by:  Family member    Pertinent History  Downs,    Limitations  Lifting;Standing;Walking;House hold activities    Patient Stated Goals  have less pain    Currently in Pain?  Yes    Pain Score  5     Pain Location  Back    Pain Orientation  Lower    Pain Descriptors / Indicators  Aching;Tightness    Pain Type  Chronic pain    Pain Radiating Towards  denies    Pain Onset  More than a month  ago    Pain Frequency  Constant    Aggravating Factors   walking, standing,  activity pain up 9/10    Pain Relieving Factors  rest and Aleve pain can be down to 1-2/10    Effect of Pain on Daily Activities  has to use an electric scooter, limits activity         [facility] PT Assessment - 09/29/18 0001      Assessment   Medical Diagnosis  LBP, left shoulder pain    Referring Provider (PT)  Dr. Darene Lamer    Onset Date/Surgical Date  08/29/18    Prior Therapy  about 1 year ago      Precautions   Precautions  None      Balance Screen   Has the patient fallen in the past 6 months  No    Has the patient had a decrease in activity level because of a fear of falling?   No    Is the patient reluctant to leave their home because of a fear of falling?   No      Home Environment   Additional Comments  single story, does cooking and cleaning      Prior Function    Level of Independence  Independent    Vocation  On disability    Leisure  no exercise, some walking      Posture/Postural Control   Posture Comments  fwd head, rounded shoulders      ROM / Strength   AROM / PROM / Strength  AROM;Strength      AROM   Overall AROM Comments  Left shoulder ROM is WNL's, Lumbar ROM decreased 50%      Strength   Overall Strength Comments  LE's strength is 4-/5      Palpation   Palpation comment  she has tenderness iwth spasms in the lumbar area                Objective measurements completed on examination: See above findings.      [facility] Adult PT Treatment/Exercise - 09/29/18 0001      Modalities   Modalities  Moist Heat;Electrical Stimulation      Moist Heat Therapy   Number Minutes Moist Heat  15 Minutes    Moist Heat Location  Lumbar Spine      Electrical Stimulation   Electrical Stimulation Location  lumbar area    Electrical Stimulation Action  IFC    Electrical Stimulation Parameters  supine    Electrical Stimulation Goals  Pain             PT Education - 09/29/18 1548    Education Details  Wms flexion    Person(s) Educated  Patient    Methods  Explanation;Demonstration;Handout    Comprehension  Verbalized understanding       PT Short Term Goals - 09/29/18 1618      PT SHORT TERM GOAL #1   Title  independent with initial HEP    Time  2    Period  Weeks    Status  New        PT Long Term Goals - 09/29/18 1618      PT LONG TERM GOAL #1   Title  understadn posture and body mechanics    Time  8    Period  Weeks    Status  New      PT LONG TERM GOAL #2   Title  dcrease pain 50%  Time  8    Period  Weeks    Status  New      PT LONG TERM GOAL #3   Title  increase lumbar ROM 25%    Time  8    Period  Weeks    Status  New      PT LONG TERM GOAL #4   Title  report able to cook a meal without sitting down    Time  8    Period  Weeks    Status  New             Plan - 09/29/18 1553     Clinical Impression Statement  Patietn has LBP diagnosed with DDD.  Lumbar ROm decreased 50% with some LBP.  She reports difficulty standing to cook meals and walking to sho, at times uses an electric cart due to back pain.  Also has left shoulder pain, reports that it was very bad until she had an injection last week, the shoulder ROM is normal today.    Stability/Clinical Decision Making  Stable/Uncomplicated    Clinical Decision Making  Low    Rehab Potential  Good    PT Frequency  2x / week    PT Duration  8 weeks    PT Treatment/Interventions  ADLs/Self Care Home Management;Cryotherapy;Electrical Stimulation;Iontophoresis 4mg /ml Dexamethasone;Moist Heat;Ultrasound;Functional mobility training;Therapeutic activities;Patient/family education;Therapeutic exercise    PT Next Visit Plan  slowly start some exercises    Consulted and Agree with Plan of Care  Patient       Patient will benefit from skilled therapeutic intervention in order to improve the following deficits and impairments:  Abnormal gait, Improper body mechanics, Pain, Postural dysfunction, Increased muscle spasms, Decreased activity tolerance, Decreased endurance, Decreased range of motion, Decreased strength, Difficulty walking, Impaired flexibility  Visit Diagnosis: Acute bilateral low back pain without sciatica - Plan: PT plan of care cert/re-cert  Muscle spasm of back - Plan: PT plan of care cert/re-cert  Difficulty in walking, not elsewhere classified - Plan: PT plan of care cert/re-cert  Acute pain of left shoulder - Plan: PT plan of care cert/re-cert     Problem List Patient Active Problem List   Diagnosis Date Noted  . Bile salt-induced diarrhea 09/25/2018  . Arthritis of left acromioclavicular joint 09/23/2018  . Precordial chest pain 08/11/2018  . Morbid obesity ([place]) 10/31/2017  . S/P right UKR, lateral 10/30/2017  . Incarcerated epigastric hernia 05/30/2015  . Left foot pain 08/09/2014  . Chronic  diarrhea 07/27/2014  . Gout 08/10/2012  . Acid reflux 01/22/2012  . Down syndrome 11/11/2011  . Hernia of abdominal wall 10/21/2011  . Psoriasis 10/21/2011  . Type 2 diabetes mellitus, uncontrolled ([place]) 04/12/2011  . Hypothyroid 04/12/2011  . Gait abnormality 06/22/2010  . Tibial tendinitis, posterior 06/22/2010    Sumner Boast., PT 09/29/2018, 4:22 PM  [address] [place], [place], [address] Phone: [phone]   Fax:  [phone]  Name: Wanda Oneill MRN: [id number] Date of Birth: 26-Apr-1980

## 2018-10-01 ENCOUNTER — Ambulatory Visit: Payer: Medicare Other | Admitting: Physical Therapy

## 2018-10-01 ENCOUNTER — Other Ambulatory Visit: Payer: Self-pay

## 2018-10-01 ENCOUNTER — Encounter: Payer: Self-pay | Admitting: Physical Therapy

## 2018-10-01 DIAGNOSIS — M62830 Muscle spasm of back: Secondary | ICD-10-CM

## 2018-10-01 DIAGNOSIS — M545 Low back pain, unspecified: Secondary | ICD-10-CM

## 2018-10-01 NOTE — Therapy (Signed)
[place], [place], [address] Phone: [phone]   Fax:  [phone]  Physical Therapy Treatment  Patient Details  Name: Wanda Oneill MRN: [id number] Date of Birth: April 08, 1980 Referring Provider (PT): Dr. Darene Lamer   Encounter Date: 10/01/2018  PT End of Session - 10/01/18 1429    Visit Number  2    Date for PT Re-Evaluation  11/29/18    PT Start Time  1355    PT Stop Time  [id number]    PT Time Calculation (min)  50 min       Past Medical History:  Diagnosis Date  . Diabetes mellitus    type II  . Down syndrome   . Family history of adverse reaction to anesthesia    mom has had n/v  . Gastritis   . GERD (gastroesophageal reflux disease)   . Headache   . Hepatic steatosis   . Hidradenitis   . Hypothyroidism   . Irritable bowel syndrome   . Periumbilical hernia   . Pneumonia   . Restless legs   . Tendonitis    chronic in left foot  . Thyroid disease    hypothyroidism    Past Surgical History:  Procedure Laterality Date  . AXILLARY HIDRADENITIS EXCISION Bilateral   . CHOLECYSTECTOMY    . HERNIA REPAIR     multiple  . INCISIONAL HERNIA REPAIR  05/30/2015   LAPROSCOPIC  . INCISIONAL HERNIA REPAIR N/A 05/30/2015   Procedure: LAPAROSCOPIC INCISIONAL HERNIA;  Surgeon: Rolm Bookbinder, MD;  Location: [facility];  Service: General;  Laterality: N/A;  . INSERTION OF MESH N/A 05/30/2015   Procedure: INSERTION OF MESH;  Surgeon: Rolm Bookbinder, MD;  Location: [facility];  Service: General;  Laterality: N/A;  . KNEE ARTHROSCOPY     right knee  . LAPAROSCOPY N/A 05/30/2015   Procedure: LAPAROSCOPY DIAGNOSTIC;  Surgeon: Rolm Bookbinder, MD;  Location: [facility];  Service: General;  Laterality: N/A;  . PARTIAL KNEE ARTHROPLASTY Right 10/30/2017   Procedure: UNICOMPARTMENTAL RIGHT KNEE LATERAL;  Surgeon: Paralee Cancel, MD;  [facility];  Service: Orthopedics;  Laterality: Right;  90 mins  . TONSILLECTOMY      adenoids    There were no vitals filed for this visit.  Subjective Assessment - 10/01/18 1358    Subjective  shoulder feels fine after the injection, back does hurt. in good spirits.pt verb working on HEP but has to use hands on pant legs.    Pain Score  5     Pain Location  Eye                       [facility] Adult PT Treatment/Exercise - 10/01/18 0001      Exercises   Exercises  Knee/Hip;Shoulder;Lumbar      Lumbar Exercises: Aerobic   Nustep  lvl 4 30min.      Lumbar Exercises: Seated   Other Seated Lumbar Exercises  pelvic motions on dyna disc 10reps    Other Seated Lumbar Exercises  LAQ, Hip abduction, and marching seated on DynaDisc BIL 3# 10 reps      Lumbar Exercises: Supine   Other Supine Lumbar Exercises  Red Physio ball KTC, oblique, brdige DL, ball squeezes with abs      Knee/Hip Exercises: Standing   Hip Abduction  Stengthening;Both;10 reps;Knee straight    Other Standing Knee Exercises  Hip extension BIL 10 reps Y tband   knee  straight     Shoulder Exercises: Standing   Extension  10 reps;Both;Strengthening;Theraband    Theraband Level (Shoulder Extension)  Level 1 (Yellow)    Row  Strengthening;Both;10 reps;Theraband    Theraband Level (Shoulder Row)  Level 1 (Yellow)    Row Limitations  VC's for proper form      Modalities   Modalities  Moist Heat;Electrical Stimulation      Moist Heat Therapy   Number Minutes Moist Heat  15 Minutes    Moist Heat Location  Lumbar Spine      Electrical Stimulation   Electrical Stimulation Location  lumbar area    Electrical Stimulation Action  IFC    Electrical Stimulation Parameters  supine    Electrical Stimulation Goals  Pain               PT Short Term Goals - 09/29/18 1618      PT SHORT TERM GOAL #1   Title  independent with initial HEP    Time  2    Period  Weeks    Status  New        PT Long Term Goals - 09/29/18 1618      PT LONG TERM GOAL #1   Title  understadn posture  and body mechanics    Time  8    Period  Weeks    Status  New      PT LONG TERM GOAL #2   Title  dcrease pain 50%    Time  8    Period  Weeks    Status  New      PT LONG TERM GOAL #3   Title  increase lumbar ROM 25%    Time  8    Period  Weeks    Status  New      PT LONG TERM GOAL #4   Title  report able to cook a meal without sitting down    Time  8    Period  Weeks    Status  New            Plan - 10/01/18 1429    Clinical Impression Statement  pt tolerated ther ex well but did need VCing for core engagemnet and compensations.    PT Treatment/Interventions  ADLs/Self Care Home Management;Cryotherapy;Electrical Stimulation;Iontophoresis 4mg /ml Dexamethasone;Moist Heat;Ultrasound;Functional mobility training;Therapeutic activities;Patient/family education;Therapeutic exercise    PT Next Visit Plan  slowly start some exercises       Patient will benefit from skilled therapeutic intervention in order to improve the following deficits and impairments:  Abnormal gait, Improper body mechanics, Pain, Postural dysfunction, Increased muscle spasms, Decreased activity tolerance, Decreased endurance, Decreased range of motion, Decreased strength, Difficulty walking, Impaired flexibility  Visit Diagnosis: Acute bilateral low back pain without sciatica  Muscle spasm of back     Problem List Patient Active Problem List   Diagnosis Date Noted  . Bile salt-induced diarrhea 09/25/2018  . Arthritis of left acromioclavicular joint 09/23/2018  . Precordial chest pain 08/11/2018  . Morbid obesity ([place]) 10/31/2017  . S/P right UKR, lateral 10/30/2017  . Incarcerated epigastric hernia 05/30/2015  . Left foot pain 08/09/2014  . Chronic diarrhea 07/27/2014  . Gout 08/10/2012  . Acid reflux 01/22/2012  . Down syndrome 11/11/2011  . Hernia of abdominal wall 10/21/2011  . Psoriasis 10/21/2011  . Type 2 diabetes mellitus, uncontrolled ([place]) 04/12/2011  . Hypothyroid 04/12/2011  .  Gait abnormality 06/22/2010  . Tibial tendinitis, posterior 06/22/2010  Richele Strand,ANGIE PTA 10/01/2018, 2:32 PM  [place] [address] [address] [place], [place], [address] Phone: [phone]   Fax:  [phone]  Name: TSUYUKO MADURA MRN: [id number] Date of Birth: [birth year]

## 2018-10-01 NOTE — Progress Notes (Signed)
Addendum: Reviewed and agree with assessment and management plan. Beyonca Wisz M, MD  

## 2018-10-06 ENCOUNTER — Ambulatory Visit: Payer: Medicare Other | Attending: Sports Medicine | Admitting: Physical Therapy

## 2018-10-06 ENCOUNTER — Encounter: Payer: Self-pay | Admitting: Physical Therapy

## 2018-10-06 ENCOUNTER — Other Ambulatory Visit: Payer: Self-pay

## 2018-10-06 DIAGNOSIS — R262 Difficulty in walking, not elsewhere classified: Secondary | ICD-10-CM | POA: Diagnosis present

## 2018-10-06 DIAGNOSIS — M62830 Muscle spasm of back: Secondary | ICD-10-CM | POA: Diagnosis present

## 2018-10-06 DIAGNOSIS — M25661 Stiffness of right knee, not elsewhere classified: Secondary | ICD-10-CM | POA: Insufficient documentation

## 2018-10-06 DIAGNOSIS — M545 Low back pain, unspecified: Secondary | ICD-10-CM

## 2018-10-06 NOTE — Therapy (Signed)
[place], [place], [address] Phone: [phone]   Fax:  [phone]  Physical Therapy Treatment  Patient Details  Name: Wanda Oneill MRN: [id number] Date of Birth: 1981/01/19 Referring Provider (PT): Dr. Darene Lamer   Encounter Date: 10/06/2018  PT End of Session - 10/06/18 1642    Visit Number  3    Date for PT Re-Evaluation  11/29/18    Authorization Type  Medicare    PT Start Time  1608    PT Stop Time  1506    PT Time Calculation (min)  1378 min    Activity Tolerance  Patient tolerated treatment well    Behavior During Therapy  [facility] for tasks assessed/performed       Past Medical History:  Diagnosis Date  . Diabetes mellitus    type II  . Down syndrome   . Family history of adverse reaction to anesthesia    mom has had n/v  . Gastritis   . GERD (gastroesophageal reflux disease)   . Headache   . Hepatic steatosis   . Hidradenitis   . Hypothyroidism   . Irritable bowel syndrome   . Periumbilical hernia   . Pneumonia   . Restless legs   . Tendonitis    chronic in left foot  . Thyroid disease    hypothyroidism    Past Surgical History:  Procedure Laterality Date  . AXILLARY HIDRADENITIS EXCISION Bilateral   . CHOLECYSTECTOMY    . HERNIA REPAIR     multiple  . INCISIONAL HERNIA REPAIR  05/30/2015   LAPROSCOPIC  . INCISIONAL HERNIA REPAIR N/A 05/30/2015   Procedure: LAPAROSCOPIC INCISIONAL HERNIA;  Surgeon: Rolm Bookbinder, MD;  Location: [facility];  Service: General;  Laterality: N/A;  . INSERTION OF MESH N/A 05/30/2015   Procedure: INSERTION OF MESH;  Surgeon: Rolm Bookbinder, MD;  Location: [facility];  Service: General;  Laterality: N/A;  . KNEE ARTHROSCOPY     right knee  . LAPAROSCOPY N/A 05/30/2015   Procedure: LAPAROSCOPY DIAGNOSTIC;  Surgeon: Rolm Bookbinder, MD;  Location: [place] [place];  Service: General;  Laterality: N/A;  . PARTIAL KNEE ARTHROPLASTY Right 10/30/2017   Procedure:  UNICOMPARTMENTAL RIGHT KNEE LATERAL;  Surgeon: Paralee Cancel, MD;  [facility];  Service: Orthopedics;  Laterality: Right;  90 mins  . TONSILLECTOMY     adenoids    There were no vitals filed for this visit.  Subjective Assessment - 10/06/18 1611    Subjective  Patient reports that she thinks the stuff "we do here really helps"    Currently in Pain?  Yes    Pain Score  6     Pain Location  Back    Pain Orientation  Lower    Pain Relieving Factors  the treatment here                       [facility] Adult PT Treatment/Exercise - 10/06/18 0001      Lumbar Exercises: Aerobic   Nustep  lvl 4 110min.      Lumbar Exercises: Seated   Other Seated Lumbar Exercises  pelvic mobility and stability on dyna disc kicks, marches, red tband 2 ways      Lumbar Exercises: Supine   Other Supine Lumbar Exercises  Red Physio ball KTC, oblique, brdige DL, ball squeezes with abs      Knee/Hip Exercises: Standing   Hip Flexion  Both;2 sets;10 reps  Hip Flexion Limitations  2#    Hip Abduction  Both;2 sets;10 reps    Abduction Limitations  2#    Hip Extension  Both;2 sets;10 reps    Extension Limitations  2#      Modalities   Modalities  Moist Heat;Electrical Stimulation      Moist Heat Therapy   Number Minutes Moist Heat  15 Minutes    Moist Heat Location  Lumbar Spine      Electrical Stimulation   Electrical Stimulation Location  lumbar area    Electrical Stimulation Action  IFC    Electrical Stimulation Parameters  supine    Electrical Stimulation Goals  Pain               PT Short Term Goals - 10/06/18 1708      PT SHORT TERM GOAL #1   Title  independent with initial HEP    Status  On-going        PT Long Term Goals - 09/29/18 1618      PT LONG TERM GOAL #1   Title  understadn posture and body mechanics    Time  8    Period  Weeks    Status  New      PT LONG TERM GOAL #2   Title  dcrease pain 50%    Time  8    Period  Weeks    Status  New       PT LONG TERM GOAL #3   Title  increase lumbar ROM 25%    Time  8    Period  Weeks    Status  New      PT LONG TERM GOAL #4   Title  report able to cook a meal without sitting down    Time  8    Period  Weeks    Status  New            Plan - 10/06/18 1706    Clinical Impression Statement  Patient worked hard with the PT, cues needed to get core activation, and to work the correct mms and decrease compensation.  She does c/o fatigue and some pain with the activities.  Reports that the modalities "feel great"    PT Next Visit Plan  continue to progress exercises as tolerated    Consulted and Agree with Plan of Care  Patient       Patient will benefit from skilled therapeutic intervention in order to improve the following deficits and impairments:  Abnormal gait, Improper body mechanics, Pain, Postural dysfunction, Increased muscle spasms, Decreased activity tolerance, Decreased endurance, Decreased range of motion, Decreased strength, Difficulty walking, Impaired flexibility  Visit Diagnosis: Acute bilateral low back pain without sciatica  Muscle spasm of back     Problem List Patient Active Problem List   Diagnosis Date Noted  . Bile salt-induced diarrhea 09/25/2018  . Arthritis of left acromioclavicular joint 09/23/2018  . Precordial chest pain 08/11/2018  . Morbid obesity ([place]) 10/31/2017  . S/P right UKR, lateral 10/30/2017  . Incarcerated epigastric hernia 05/30/2015  . Left foot pain 08/09/2014  . Chronic diarrhea 07/27/2014  . Gout 08/10/2012  . Acid reflux 01/22/2012  . Down syndrome 11/11/2011  . Hernia of abdominal wall 10/21/2011  . Psoriasis 10/21/2011  . Type 2 diabetes mellitus, uncontrolled ([place]) 04/12/2011  . Hypothyroid 04/12/2011  . Gait abnormality 06/22/2010  . Tibial tendinitis, posterior 06/22/2010    Sumner Boast., PT 10/06/2018, 5:08 PM  [facility]  [place] [address]  [place], [place], [address] Phone: [phone]   Fax:  [phone]  Name: Wanda Oneill MRN: [id number] Date of Birth: Nov 20, 1980

## 2018-10-08 ENCOUNTER — Encounter: Payer: Self-pay | Admitting: Physical Therapy

## 2018-10-08 ENCOUNTER — Ambulatory Visit: Payer: Medicare Other | Admitting: Physical Therapy

## 2018-10-08 ENCOUNTER — Other Ambulatory Visit: Payer: Self-pay

## 2018-10-08 DIAGNOSIS — M62830 Muscle spasm of back: Secondary | ICD-10-CM

## 2018-10-08 DIAGNOSIS — M545 Low back pain, unspecified: Secondary | ICD-10-CM

## 2018-10-08 DIAGNOSIS — M25661 Stiffness of right knee, not elsewhere classified: Secondary | ICD-10-CM

## 2018-10-08 NOTE — Therapy (Signed)
[place], [place], [address] Phone: [phone]   Fax:  [phone]  Physical Therapy Treatment  Patient Details  Name: Wanda Oneill MRN: [id number] Date of Birth: January 05, 1981 Referring Provider (PT): Dr. Darene Lamer   Encounter Date: 10/08/2018  PT End of Session - 10/08/18 1704    Visit Number  4    Date for PT Re-Evaluation  11/29/18    PT Start Time  1620    PT Stop Time  [id number]    PT Time Calculation (min)  57 min       Past Medical History:  Diagnosis Date  . Diabetes mellitus    type II  . Down syndrome   . Family history of adverse reaction to anesthesia    mom has had n/v  . Gastritis   . GERD (gastroesophageal reflux disease)   . Headache   . Hepatic steatosis   . Hidradenitis   . Hypothyroidism   . Irritable bowel syndrome   . Periumbilical hernia   . Pneumonia   . Restless legs   . Tendonitis    chronic in left foot  . Thyroid disease    hypothyroidism    Past Surgical History:  Procedure Laterality Date  . AXILLARY HIDRADENITIS EXCISION Bilateral   . CHOLECYSTECTOMY    . HERNIA REPAIR     multiple  . INCISIONAL HERNIA REPAIR  05/30/2015   LAPROSCOPIC  . INCISIONAL HERNIA REPAIR N/A 05/30/2015   Procedure: LAPAROSCOPIC INCISIONAL HERNIA;  Surgeon: Rolm Bookbinder, MD;  Location: [facility];  Service: General;  Laterality: N/A;  . INSERTION OF MESH N/A 05/30/2015   Procedure: INSERTION OF MESH;  Surgeon: Rolm Bookbinder, MD;  Location: [facility];  Service: General;  Laterality: N/A;  . KNEE ARTHROSCOPY     right knee  . LAPAROSCOPY N/A 05/30/2015   Procedure: LAPAROSCOPY DIAGNOSTIC;  Surgeon: Rolm Bookbinder, MD;  Location: [facility];  Service: General;  Laterality: N/A;  . PARTIAL KNEE ARTHROPLASTY Right 10/30/2017   Procedure: UNICOMPARTMENTAL RIGHT KNEE LATERAL;  Surgeon: Paralee Cancel, MD;  [facility];  Service: Orthopedics;  Laterality: Right;  90 mins  . TONSILLECTOMY      adenoids    There were no vitals filed for this visit.  Subjective Assessment - 10/08/18 1617    Subjective  low back hurts, not all the time    Pain Score  5                        [facility] Adult PT Treatment/Exercise - 10/08/18 0001      Lumbar Exercises: Aerobic   Nustep  lvl 4 22min.      Lumbar Exercises: Seated   Long Arc Quad on Chair  Both;2 sets;10 reps;Weights   on DynaDisc   LAQ on Chair Weights (lbs)  2    Other Seated Lumbar Exercises  pelvic mobility and stability on dyna disc kicks, marches, red tband ABD      Lumbar Exercises: Supine   Other Supine Lumbar Exercises  Red Physio ball KTC, bridge DL, ball squeezes with abs      Knee/Hip Exercises: Standing   Hip Flexion  Stengthening;Both;2 sets;10 reps;Knee straight    Hip Flexion Limitations  2#    Hip Abduction  Both;2 sets;10 reps;Stengthening;Knee straight    Abduction Limitations  2#    Hip Extension  Both;2 sets;10 reps;Stengthening;Knee straight    Extension Limitations  2#  Modalities   Modalities  Moist Heat;Electrical Stimulation      Moist Heat Therapy   Number Minutes Moist Heat  15 Minutes    Moist Heat Location  Lumbar Spine      Electrical Stimulation   Electrical Stimulation Location  lumbar area    Electrical Stimulation Action  IFC    Electrical Stimulation Parameters  Supine    Electrical Stimulation Goals  Pain               PT Short Term Goals - 10/06/18 1708      PT SHORT TERM GOAL #1   Title  independent with initial HEP    Status  On-going        PT Long Term Goals - 10/08/18 1624      PT LONG TERM GOAL #1   Title  understadn posture and body mechanics    Status  On-going      PT LONG TERM GOAL #2   Title  dcrease pain 50%    Status  On-going      PT LONG TERM GOAL #3   Title  increase lumbar ROM 25%    Status  On-going      PT LONG TERM GOAL #4   Title  report able to cook a meal without sitting down    Baseline  shorter recipe  can be completed standing, longer she will sit at counter    Status  On-going      PT LONG TERM GOAL #5   Title  walk with [facility] or less device for home and community distances    Status  On-going            Plan - 10/08/18 1705    Clinical Impression Statement  pt. completed all exercises with no reports of increased pain. Cues to maintain core during stability exercises, decrease compnesatory strategies. She does visibly fatigue throughout session but is able to persevere. Excited about modalities for pain management    PT Treatment/Interventions  ADLs/Self Care Home Management;Cryotherapy;Electrical Stimulation;Iontophoresis 4mg /ml Dexamethasone;Moist Heat;Ultrasound;Functional mobility training;Therapeutic activities;Patient/family education;Therapeutic exercise    PT Next Visit Plan  continue to progress exercises as tolerated       Patient will benefit from skilled therapeutic intervention in order to improve the following deficits and impairments:  Abnormal gait, Improper body mechanics, Pain, Postural dysfunction, Increased muscle spasms, Decreased activity tolerance, Decreased endurance, Decreased range of motion, Decreased strength, Difficulty walking, Impaired flexibility  Visit Diagnosis: Acute bilateral low back pain without sciatica  Muscle spasm of back  Stiffness of right knee, not elsewhere classified     Problem List Patient Active Problem List   Diagnosis Date Noted  . Bile salt-induced diarrhea 09/25/2018  . Arthritis of left acromioclavicular joint 09/23/2018  . Precordial chest pain 08/11/2018  . Morbid obesity ([place]) 10/31/2017  . S/P right UKR, lateral 10/30/2017  . Incarcerated epigastric hernia 05/30/2015  . Left foot pain 08/09/2014  . Chronic diarrhea 07/27/2014  . Gout 08/10/2012  . Acid reflux 01/22/2012  . Down syndrome 11/11/2011  . Hernia of abdominal wall 10/21/2011  . Psoriasis 10/21/2011  . Type 2 diabetes mellitus, uncontrolled ([place])  04/12/2011  . Hypothyroid 04/12/2011  . Gait abnormality 06/22/2010  . Tibial tendinitis, posterior 06/22/2010    Dylan Xzavior Reinig, SPTA 10/08/2018, 5:07 PM  [place] [address] [place], [place], [address] Phone: [phone]   Fax:  [phone]  Name: Wanda Oneill  MRN: [id number] Date of Birth: [birth year]

## 2018-10-13 ENCOUNTER — Other Ambulatory Visit: Payer: Self-pay

## 2018-10-13 ENCOUNTER — Ambulatory Visit: Payer: Medicare Other | Admitting: Physical Therapy

## 2018-10-13 ENCOUNTER — Encounter: Payer: Self-pay | Admitting: Physical Therapy

## 2018-10-13 DIAGNOSIS — M545 Low back pain: Secondary | ICD-10-CM | POA: Diagnosis not present

## 2018-10-13 DIAGNOSIS — M62830 Muscle spasm of back: Secondary | ICD-10-CM

## 2018-10-13 NOTE — Therapy (Signed)
[place], [place], [address] Phone: [phone]   Fax:  [phone]  Physical Therapy Treatment  Patient Details  Name: Wanda Oneill MRN: [id number] Date of Birth: Sep 01, 1980 Referring Provider (PT): Dr. Darene Lamer   Encounter Date: 10/13/2018  PT End of Session - 10/13/18 1520    Visit Number  5    Date for PT Re-Evaluation  11/29/18    Authorization Type  Medicare    PT Start Time  [id number]    PT Stop Time  1539    PT Time Calculation (min)  54 min    Activity Tolerance  Patient tolerated treatment well    Behavior During Therapy  [facility] for tasks assessed/performed       Past Medical History:  Diagnosis Date  . Diabetes mellitus    type II  . Down syndrome   . Family history of adverse reaction to anesthesia    mom has had n/v  . Gastritis   . GERD (gastroesophageal reflux disease)   . Headache   . Hepatic steatosis   . Hidradenitis   . Hypothyroidism   . Irritable bowel syndrome   . Periumbilical hernia   . Pneumonia   . Restless legs   . Tendonitis    chronic in left foot  . Thyroid disease    hypothyroidism    Past Surgical History:  Procedure Laterality Date  . AXILLARY HIDRADENITIS EXCISION Bilateral   . CHOLECYSTECTOMY    . HERNIA REPAIR     multiple  . INCISIONAL HERNIA REPAIR  05/30/2015   LAPROSCOPIC  . INCISIONAL HERNIA REPAIR N/A 05/30/2015   Procedure: LAPAROSCOPIC INCISIONAL HERNIA;  Surgeon: Rolm Bookbinder, MD;  Location: [facility];  Service: General;  Laterality: N/A;  . INSERTION OF MESH N/A 05/30/2015   Procedure: INSERTION OF MESH;  Surgeon: Rolm Bookbinder, MD;  Location: [facility];  Service: General;  Laterality: N/A;  . KNEE ARTHROSCOPY     right knee  . LAPAROSCOPY N/A 05/30/2015   Procedure: LAPAROSCOPY DIAGNOSTIC;  Surgeon: Rolm Bookbinder, MD;  Location: [facility];  Service: General;  Laterality: N/A;  . PARTIAL KNEE ARTHROPLASTY Right 10/30/2017   Procedure:  UNICOMPARTMENTAL RIGHT KNEE LATERAL;  Surgeon: Paralee Cancel, MD;  [facility];  Service: Orthopedics;  Laterality: Right;  90 mins  . TONSILLECTOMY     adenoids    There were no vitals filed for this visit.  Subjective Assessment - 10/13/18 1449    Subjective  Patient reports that she has been on her feet more and is hurting more over the weekend    Currently in Pain?  Yes    Pain Score  6     Pain Location  Back    Pain Orientation  Lower    Aggravating Factors   standing                       [facility] Adult PT Treatment/Exercise - 10/13/18 0001      Lumbar Exercises: Aerobic   Nustep  level 4 x 6 minutes      Lumbar Exercises: Seated   Long Arc Quad on Chair  Both;2 sets;10 reps;Weights    LAQ on Chair Weights (lbs)  2.5    Hip Flexion on Ball  Both;20 reps   on mat table not the ball   Hip Flexion on Ball Limitations  2.5#    Other Seated Lumbar Exercises  pelvic mobility  and stability on dyna disc kicks, marches, red tband ABD    Other Seated Lumbar Exercises  red tband rows and extension, red tband HS curls      Lumbar Exercises: Supine   Bridge with Ball Squeeze  15 reps;1 second    Other Supine Lumbar Exercises  Red Physio ball KTC, bridge DL, ball squeezes with abs      Knee/Hip Exercises: Standing   Hip Flexion  Stengthening;Both;2 sets;10 reps;Knee straight    Hip Flexion Limitations  2#    Hip Abduction  Both;2 sets;10 reps;Stengthening;Knee straight    Abduction Limitations  2#    Hip Extension  Both;2 sets;10 reps;Stengthening;Knee straight    Extension Limitations  2#      Modalities   Modalities  Moist Heat;Electrical Stimulation      Moist Heat Therapy   Number Minutes Moist Heat  15 Minutes    Moist Heat Location  Lumbar Spine      Electrical Stimulation   Electrical Stimulation Location  lumbar area    Electrical Stimulation Action  IFC    Electrical Stimulation Parameters  supine    Electrical Stimulation Goals  Pain                PT Short Term Goals - 10/06/18 1708      PT SHORT TERM GOAL #1   Title  independent with initial HEP    Status  On-going        PT Long Term Goals - 10/13/18 1522      PT LONG TERM GOAL #1   Title  understadn posture and body mechanics    Status  On-going      PT LONG TERM GOAL #2   Title  dcrease pain 50%    Status  On-going      PT LONG TERM GOAL #3   Title  increase lumbar ROM 25%    Status  On-going            Plan - 10/13/18 1520    Clinical Impression Statement  Patient a little more lethargic and with increaed c/o pain today after doing more standing yesterday.  She really does have difficulty getting good abdominal contractions, a lot of cues verbal and tactile needed    PT Next Visit Plan  continue to progress exercises as tolerated    Consulted and Agree with Plan of Care  Patient       Patient will benefit from skilled therapeutic intervention in order to improve the following deficits and impairments:  Abnormal gait, Improper body mechanics, Pain, Postural dysfunction, Increased muscle spasms, Decreased activity tolerance, Decreased endurance, Decreased range of motion, Decreased [place], Difficulty walking, Impaired flexibility  Visit Diagnosis: Muscle spasm of back     Problem List Patient Active Problem List   Diagnosis Date Noted  . Bile salt-induced diarrhea 09/25/2018  . Arthritis of left acromioclavicular joint 09/23/2018  . Precordial chest pain 08/11/2018  . Morbid obesity ([place]) 10/31/2017  . S/P right UKR, lateral 10/30/2017  . Incarcerated epigastric hernia 05/30/2015  . Left foot pain 08/09/2014  . Chronic diarrhea 07/27/2014  . Gout 08/10/2012  . Acid reflux 01/22/2012  . Down syndrome 11/11/2011  . Hernia of abdominal wall 10/21/2011  . Psoriasis 10/21/2011  . Type 2 diabetes mellitus, uncontrolled ([place]) 04/12/2011  . Hypothyroid 04/12/2011  . Gait abnormality 06/22/2010  . Tibial tendinitis, posterior  06/22/2010    Sumner Boast., PT 10/13/2018, 3:23 PM  [facility]  [address] [place], [place], [address] Phone: [phone]   Fax:  [phone]  Name: GENIEVA ROCHEL MRN: [id number] Date of Birth: Aug 15, 1980

## 2018-10-14 ENCOUNTER — Encounter: Payer: Self-pay | Admitting: Physical Therapy

## 2018-10-14 ENCOUNTER — Ambulatory Visit: Payer: Medicare Other | Admitting: Physical Therapy

## 2018-10-14 DIAGNOSIS — M545 Low back pain, unspecified: Secondary | ICD-10-CM

## 2018-10-14 DIAGNOSIS — M62830 Muscle spasm of back: Secondary | ICD-10-CM

## 2018-10-14 NOTE — Therapy (Signed)
[place] [address] [place], [place], [address] Phone: [phone]   Fax:  [phone]  Physical Therapy Treatment  Patient Details  Name: Wanda Oneill MRN: [id number] Date of Birth: 06-Oct-1980 Referring Provider (PT): Dr. Darene Lamer   Encounter Date: 10/14/2018  PT End of Session - 10/14/18 1648    Visit Number  6    Date for PT Re-Evaluation  11/29/18    Authorization Type  Medicare    PT Start Time  4327    PT Stop Time  1705    PT Time Calculation (min)  56 min    Activity Tolerance  Patient tolerated treatment well    Behavior During Therapy  [facility] for tasks assessed/performed       Past Medical History:  Diagnosis Date  . Diabetes mellitus    type II  . Down syndrome   . Family history of adverse reaction to anesthesia    mom has had n/v  . Gastritis   . GERD (gastroesophageal reflux disease)   . Headache   . Hepatic steatosis   . Hidradenitis   . Hypothyroidism   . Irritable bowel syndrome   . Periumbilical hernia   . Pneumonia   . Restless legs   . Tendonitis    chronic in left foot  . Thyroid disease    hypothyroidism    Past Surgical History:  Procedure Laterality Date  . AXILLARY HIDRADENITIS EXCISION Bilateral   . CHOLECYSTECTOMY    . HERNIA REPAIR     multiple  . INCISIONAL HERNIA REPAIR  05/30/2015   LAPROSCOPIC  . INCISIONAL HERNIA REPAIR N/A 05/30/2015   Procedure: LAPAROSCOPIC INCISIONAL HERNIA;  Surgeon: Rolm Bookbinder, MD;  Location: [place];  Service: General;  Laterality: N/A;  . INSERTION OF MESH N/A 05/30/2015   Procedure: INSERTION OF MESH;  Surgeon: Rolm Bookbinder, MD;  Location: [place];  Service: General;  Laterality: N/A;  . KNEE ARTHROSCOPY     right knee  . LAPAROSCOPY N/A 05/30/2015   Procedure: LAPAROSCOPY DIAGNOSTIC;  Surgeon: Rolm Bookbinder, MD;  Location: [place];  Service: General;  Laterality: N/A;  . PARTIAL KNEE ARTHROPLASTY Right 10/30/2017   Procedure:  UNICOMPARTMENTAL RIGHT KNEE LATERAL;  Surgeon: Paralee Cancel, MD;  [facility];  Service: Orthopedics;  Laterality: Right;  90 mins  . TONSILLECTOMY     adenoids    There were no vitals filed for this visit.  Subjective Assessment - 10/14/18 1614    Subjective  I am sore but not as much    Currently in Pain?  Yes    Pain Score  6     Pain Location  Back    Pain Orientation  Right;Lower                       [facility] Adult PT Treatment/Exercise - 10/14/18 0001      Lumbar Exercises: Machines for Strengthening   Cybex Knee Extension  5# 2x10    Cybex Knee Flexion  20# 2x10      Lumbar Exercises: Seated   Other Seated Lumbar Exercises  weighted ball from chest out front for core stability, then weighted ball from hip to hip, weighted ball overhead lift from chest level    Other Seated Lumbar Exercises  green tband rows and extension, red tband HS curls      Lumbar Exercises: Supine   Other Supine Lumbar Exercises  Red Physio ball KTC,  bridge DL, ball squeezes with abs      Modalities   Modalities  Moist Heat;Electrical Stimulation      Moist Heat Therapy   Number Minutes Moist Heat  15 Minutes    Moist Heat Location  Lumbar Spine      Electrical Stimulation   Electrical Stimulation Location  lumbar area    Electrical Stimulation Action  IFC    Electrical Stimulation Parameters  supine    Electrical Stimulation Goals  Pain               PT Short Term Goals - 10/06/18 1708      PT SHORT TERM GOAL #1   Title  independent with initial HEP    Status  On-going        PT Long Term Goals - 10/14/18 1651      PT LONG TERM GOAL #1   Title  understadn posture and body mechanics    Status  Partially Met      PT LONG TERM GOAL #4   Title  report able to cook a meal without sitting down    Status  On-going            Plan - 10/14/18 1649    Clinical Impression Statement  Patient able to do the exercises without any increase of pain, she  does need cues for posture and to try to get her to engage her core.  Added some new exercises today, will assess if they were okay next week    PT Next Visit Plan  add core stability as tolerated    Consulted and Agree with Plan of Care  Patient       Patient will benefit from skilled therapeutic intervention in order to improve the following deficits and impairments:  Abnormal gait, Improper body mechanics, Pain, Postural dysfunction, Increased muscle spasms, Decreased activity tolerance, Decreased endurance, Decreased range of motion, Decreased strength, Difficulty walking, Impaired flexibility  Visit Diagnosis: Muscle spasm of back  Acute bilateral low back pain without sciatica     Problem List Patient Active Problem List   Diagnosis Date Noted  . Bile salt-induced diarrhea 09/25/2018  . Arthritis of left acromioclavicular joint 09/23/2018  . Precordial chest pain 08/11/2018  . Morbid obesity ([place]) 10/31/2017  . S/P right UKR, lateral 10/30/2017  . Incarcerated epigastric hernia 05/30/2015  . Left foot pain 08/09/2014  . Chronic diarrhea 07/27/2014  . Gout 08/10/2012  . Acid reflux 01/22/2012  . Down syndrome 11/11/2011  . Hernia of abdominal wall 10/21/2011  . Psoriasis 10/21/2011  . Type 2 diabetes mellitus, uncontrolled ([place]) 04/12/2011  . Hypothyroid 04/12/2011  . Gait abnormality 06/22/2010  . Tibial tendinitis, posterior 06/22/2010    Sumner Boast., PT 10/14/2018, 4:52 PM  [place] [address] [place], [place], [address] Phone: [phone]   Fax:  [phone]  Name: Wanda Oneill MRN: [id number] Date of Birth: [birth year]

## 2018-10-15 ENCOUNTER — Ambulatory Visit: Payer: Medicare Other | Admitting: Family Medicine

## 2018-10-17 NOTE — Progress Notes (Signed)
[facility] [address], [place], [place] [address] [phone]  Date:  10/19/2018   Name:  Wanda Oneill   DOB:  1980/08/08   MRN:  [id number]  PCP:  Darreld Mclean, MD    Chief Complaint: Hypothyroidism (3 month follow up)   History of Present Illness:  LEVA BAINE is a 38 y.o. very pleasant female patient who presents with the following:  Shelton is a charming woman with history of down syndrome and also DM, hypothyroidism, obesity, and several MSK issues related to her Down syndrome  Last seen by myself in July of this year - at that time she mentioned chest discomfort, so I referred her to cardiology.  They are monitoring.  Calcium score 0  She does not have to go back to cardiology and is not continue to be troubled by chest pain  Lab Results  Component Value Date   HGBA1C 7.2 (H) 10/20/2017   Her endocrinologist is Dr Chalmers Cater- she does follow her A1c and TSH- will be seen next week   She takes Questran which she was getting from GI -wonders if I can take over this medication, as GI does not otherwise need to see her.  I am glad to do this when refills are due  Her feet are doing better with some more supportive athletic shoes  She is not having knee pain, but does feel like her knee might give way   She had an MRI of her lumbar spine and has been DR Arnoldo Morale- they are doing PT right now. If this does not help they may do injections.  However at this point they do not feel surgery would be helpful.  Reviewed Dr. Arnoldo Morale note from August 28 She feels like PT is helpful to her - she started about a month ago She is not able to do a lot of walking right now  Flu shot done already   She does not need a pap as she has never been sexually involved.  Discussed doing a Pap with patient and her mother, but we feel this would be traumatic for her and is not necessary  jardiance  Metformin  ozempic  OCP  synthroid   Patient Active Problem List   Diagnosis Date Noted  . Bile salt-induced diarrhea 09/25/2018  . Arthritis of left acromioclavicular joint 09/23/2018  . Precordial chest pain 08/11/2018  . Morbid obesity ([place]) 10/31/2017  . S/P right UKR, lateral 10/30/2017  . Incarcerated epigastric hernia 05/30/2015  . Left foot pain 08/09/2014  . Chronic diarrhea 07/27/2014  . Gout 08/10/2012  . Acid reflux 01/22/2012  . Down syndrome 11/11/2011  . Hernia of abdominal wall 10/21/2011  . Psoriasis 10/21/2011  . Type 2 diabetes mellitus, uncontrolled ([place]) 04/12/2011  . Hypothyroid 04/12/2011  . Gait abnormality 06/22/2010  . Tibial tendinitis, posterior 06/22/2010    Past Medical History:  Diagnosis Date  . Diabetes mellitus    type II  . Down syndrome   . Family history of adverse reaction to anesthesia    mom has had n/v  . Gastritis   . GERD (gastroesophageal reflux disease)   . Headache   . Hepatic steatosis   . Hidradenitis   . Hypothyroidism   . Irritable bowel syndrome   . Periumbilical hernia   . Pneumonia   . Restless legs   . Tendonitis    chronic in left foot  . Thyroid  disease    hypothyroidism    Past Surgical History:  Procedure Laterality Date  . AXILLARY HIDRADENITIS EXCISION Bilateral   . CHOLECYSTECTOMY    . HERNIA REPAIR     multiple  . INCISIONAL HERNIA REPAIR  05/30/2015   LAPROSCOPIC  . INCISIONAL HERNIA REPAIR N/A 05/30/2015   Procedure: LAPAROSCOPIC INCISIONAL HERNIA;  Surgeon: Rolm Bookbinder, MD;  Location: [place];  Service: General;  Laterality: N/A;  . INSERTION OF MESH N/A 05/30/2015   Procedure: INSERTION OF MESH;  Surgeon: Rolm Bookbinder, MD;  Location: [place];  Service: General;  Laterality: N/A;  . KNEE ARTHROSCOPY     right knee  . LAPAROSCOPY N/A 05/30/2015   Procedure: LAPAROSCOPY DIAGNOSTIC;  Surgeon: Rolm Bookbinder, MD;  Location: [employer];  Service: General;  Laterality: N/A;  . PARTIAL KNEE ARTHROPLASTY Right 10/30/2017    Procedure: UNICOMPARTMENTAL RIGHT KNEE LATERAL;  Surgeon: Paralee Cancel, MD;  Location[facility];  Service: Orthopedics;  Laterality: Right;  90 mins  . TONSILLECTOMY     adenoids    Social History   Tobacco Use  . Smoking status: Never Smoker  . Smokeless tobacco: Never Used  Substance Use Topics  . Alcohol use: No    Alcohol/week: 0.0 standard drinks  . Drug use: No    Family History  Problem Relation Age of Onset  . Thyroid cancer Mother   . Diabetes type II Mother   . High Cholesterol Mother   . Heart disease Mother   . Diabetes type II Father   . Hypertension Father   . Stroke Father     Allergies  Allergen Reactions  . Vancomycin Itching  . Cefuroxime Hives and Swelling    Unspecified location of swelling  . Cefuroxime Axetil Hives  . Sulfa Antibiotics Hives    Medication list has been reviewed and updated.  Current Outpatient Medications on File Prior to Visit  Medication Sig Dispense Refill  . acetaminophen (TYLENOL) 325 MG tablet Take 650 mg by mouth every 6 (six) hours as needed.    Marland Kitchen allopurinol (ZYLOPRIM) 300 MG tablet Take 1 tablet (300 mg total) by mouth daily. 90 tablet 3  . aspirin EC 81 MG tablet Take 1 tablet (81 mg total) by mouth daily. 90 tablet 3  . Blood Glucose Monitoring Suppl (BLOOD GLUCOSE METER KIT AND SUPPLIES) Dispense based on patient and insurance preference. To check blood sugars daily FOR ICD-9 250.00 1 each 0  . cetirizine (ZYRTEC) 10 MG tablet Take 10 mg by mouth daily as needed for allergies.     . cholestyramine (QUESTRAN) 4 g packet DISSOLVE & TAKE 1 POWDER BY MOUTH TWICE DAILY 180 each 3  . clobetasol cream (TEMOVATE) 3.89 % Apply 1 application topically daily as needed (for eczema).     . dicyclomine (BENTYL) 20 MG tablet Take 1 tablet (20 mg total) by mouth 4 (four) times daily -  before meals and at bedtime. (Patient taking differently: Take 20 mg by mouth 2 (two) times a day. ) 360 tablet 3  . empagliflozin (JARDIANCE) 25 MG  TABS tablet Take 25 mg by mouth daily.    . Glucose Blood (BLOOD GLUCOSE TEST STRIPS) STRP Use to test blood sugar up to twice a day 100 each 3  . JOLESSA 0.15-0.03 MG tablet Take 1 tablet by mouth once daily 91 tablet 1  . levothyroxine (SYNTHROID) 137 MCG tablet TAKE 1 TABLET BY MOUTH ONCE DAILY IN THE MORNING . APPOINTMENT REQUIRED FOR FUTURE REFILLS 90 tablet  1  . metFORMIN (GLUCOPHAGE) 1000 MG tablet Take 1,000 mg by mouth 2 (two) times daily.   2  . naproxen sodium (ALEVE) 220 MG tablet Take 220 mg by mouth.    . ONE TOUCH ULTRA TEST test strip USE TO CHECK BLOOD SUGAR ONCE DAILY (ICD CODE:25.00) 100 each 3  . Semaglutide,0.25 or 0.5MG/DOS, (OZEMPIC, 0.25 OR 0.5 MG/DOSE,) 2 MG/1.5ML SOPN Inject 0.5 mg into the skin once a week. Saturdays     No current facility-administered medications on file prior to visit.     Review of Systems:  As per HPI- otherwise negative. No fever or chills, no chest pain or shortness of breath  Physical Examination: Vitals:   10/19/18 1544  BP: 118/80  Pulse: 88  Resp: 18  Temp: (!) 97 F (36.1 C)  SpO2: 98%   Vitals:   10/19/18 1544  Weight: 239 lb (108.4 kg)  Height: _0  (1.626 m)   Body mass index is 41.02 kg/m. Ideal Body Weight: Weight in (lb) to have BMI = 25: 145.3  GEN: WDWN, NAD, Non-toxic, A & O x 3, obese, looks well HEENT: Atraumatic, Normocephalic. Neck supple. No masses, No LAD. Ears and Nose: No external deformity. CV: RRR, No M/G/R. No JVD. No thrill. No extra heart sounds. PULM: CTA B, no wheezes, crackles, rhonchi. No retractions. No resp. distress. No accessory muscle use. ABD: S, NT, ND, +BS. No rebound. No HSM. EXTR: No c/c/e NEURO Normal gait.  PSYCH: Normally interactive. Conversant. Not depressed or anxious appearing.  Calm demeanor.    Assessment and Plan: Medication monitoring encounter  Controlled type 2 diabetes mellitus without complication, without long-term current use of insulin (HCC)  Acquired  hypothyroidism  Down syndrome Here today for a periodic follow-up visit Sala's diabetes and thyroid are managed per Dr. Chalmers Cater.  Hence we will not do labs today Her eye exam is up-to-date, flu shot done already for the year Chest pain has resolved Encouraged her to continue to watch her weight, and be as active as she is able.  She will continue physical therapy for her back pain  Plan to recheck in about 6 months   Signed Lamar Blinks, MD

## 2018-10-19 ENCOUNTER — Ambulatory Visit (INDEPENDENT_AMBULATORY_CARE_PROVIDER_SITE_OTHER): Payer: Medicare Other | Admitting: Family Medicine

## 2018-10-19 ENCOUNTER — Other Ambulatory Visit: Payer: Self-pay

## 2018-10-19 ENCOUNTER — Encounter: Payer: Self-pay | Admitting: Family Medicine

## 2018-10-19 VITALS — BP 118/80 | HR 88 | Temp 97.0°F | Resp 18 | Ht 64.0 in | Wt 239.0 lb

## 2018-10-19 DIAGNOSIS — Z5181 Encounter for therapeutic drug level monitoring: Secondary | ICD-10-CM

## 2018-10-19 DIAGNOSIS — E039 Hypothyroidism, unspecified: Secondary | ICD-10-CM

## 2018-10-19 DIAGNOSIS — E119 Type 2 diabetes mellitus without complications: Secondary | ICD-10-CM

## 2018-10-19 DIAGNOSIS — Q909 Down syndrome, unspecified: Secondary | ICD-10-CM | POA: Diagnosis not present

## 2018-10-19 NOTE — Patient Instructions (Addendum)
It was great to see you again today, take care I am glad to take over the [place] when refills are due

## 2018-10-20 ENCOUNTER — Ambulatory Visit: Payer: Medicare Other | Admitting: Physical Therapy

## 2018-10-20 DIAGNOSIS — M545 Low back pain, unspecified: Secondary | ICD-10-CM

## 2018-10-20 DIAGNOSIS — M62830 Muscle spasm of back: Secondary | ICD-10-CM

## 2018-10-20 NOTE — Therapy (Signed)
[place], [place], [address] Phone: [phone]   Fax:  [phone]  Physical Therapy Treatment  Patient Details  Name: Wanda Oneill MRN: [id number] Date of Birth: 05-14-1980 Referring Provider (PT): Dr. Darene Lamer   Encounter Date: 10/20/2018  PT End of Session - 10/20/18 1558    Visit Number  7    Date for PT Re-Evaluation  11/29/18    PT Start Time  1525    PT Stop Time  1620    PT Time Calculation (min)  55 min       Past Medical History:  Diagnosis Date  . Diabetes mellitus    type II  . Down syndrome   . Family history of adverse reaction to anesthesia    mom has had n/v  . Gastritis   . GERD (gastroesophageal reflux disease)   . Headache   . Hepatic steatosis   . Hidradenitis   . Hypothyroidism   . Irritable bowel syndrome   . Periumbilical hernia   . Pneumonia   . Restless legs   . Tendonitis    chronic in left foot  . Thyroid disease    hypothyroidism    Past Surgical History:  Procedure Laterality Date  . AXILLARY HIDRADENITIS EXCISION Bilateral   . CHOLECYSTECTOMY    . HERNIA REPAIR     multiple  . INCISIONAL HERNIA REPAIR  05/30/2015   LAPROSCOPIC  . INCISIONAL HERNIA REPAIR N/A 05/30/2015   Procedure: LAPAROSCOPIC INCISIONAL HERNIA;  Surgeon: Rolm Bookbinder, MD;  Location: [facility];  Service: General;  Laterality: N/A;  . INSERTION OF MESH N/A 05/30/2015   Procedure: INSERTION OF MESH;  Surgeon: Rolm Bookbinder, MD;  Location: [facility];  Service: General;  Laterality: N/A;  . KNEE ARTHROSCOPY     right knee  . LAPAROSCOPY N/A 05/30/2015   Procedure: LAPAROSCOPY DIAGNOSTIC;  Surgeon: Rolm Bookbinder, MD;  Location: [facility];  Service: General;  Laterality: N/A;  . PARTIAL KNEE ARTHROPLASTY Right 10/30/2017   Procedure: UNICOMPARTMENTAL RIGHT KNEE LATERAL;  Surgeon: Paralee Cancel, MD;  [facility];  Service: Orthopedics;  Laterality: Right;  90 mins  . TONSILLECTOMY      adenoids    There were no vitals filed for this visit.  Subjective Assessment - 10/20/18 1528    Subjective  I can tell PT is helping    Currently in Pain?  Yes    Pain Score  5     Pain Location  Back                       [facility] Adult PT Treatment/Exercise - 10/20/18 0001      Lumbar Exercises: Aerobic   UBE (Upper Arm Bike)  1 min fwd/ 1 min back L 3 standing    Nustep  L 4 6 min      Lumbar Exercises: Machines for Strengthening   Cybex Lumbar Extension  flex and ext black tband 20 each    Cybex Knee Extension  5# 2x10    Cybex Knee Flexion  20# 2x10    Other Lumbar Machine Exercise  seated row 20# 2 sets 10   cued for upright posture     Lumbar Exercises: Seated   Other Seated Lumbar Exercises  weighted ball from chest out front for core stability, then weighted ball from hip to hip, weighted ball overhead lift from chest level   cued to engage core  Modalities   Modalities  Moist Heat;Electrical Stimulation      Moist Heat Therapy   Number Minutes Moist Heat  15 Minutes    Moist Heat Location  Lumbar Spine      Electrical Stimulation   Electrical Stimulation Location  lumbar area    Electrical Stimulation Action  IFC    Electrical Stimulation Parameters  supine    Electrical Stimulation Goals  Pain             PT Education - 10/20/18 1554    Education Details  scap stab- red tbnad    Person(s) Educated  Patient    Methods  Explanation;Demonstration;Verbal cues;Handout    Comprehension  Verbalized understanding;Returned demonstration       PT Short Term Goals - 10/20/18 1557      PT SHORT TERM GOAL #1   Title  independent with initial HEP    Status  Achieved        PT Long Term Goals - 10/14/18 1651      PT LONG TERM GOAL #1   Title  understadn posture and body mechanics    Status  Partially Met      PT LONG TERM GOAL #4   Title  report able to cook a meal without sitting down    Status  On-going             Plan - 10/20/18 1558    Clinical Impression Statement  added some wt machines today as well as trunk flex and ext - cues to activate core and she did well without pain. issued tband HEP for shld and core. pt continues to see benefits from PT. pt does need cuing for posture and core activation    PT Treatment/Interventions  ADLs/Self Care Home Management;Cryotherapy;Electrical Stimulation;Iontophoresis 2m/ml Dexamethasone;Moist Heat;Ultrasound;Functional mobility training;Therapeutic activities;Patient/family education;Therapeutic exercise    PT Next Visit Plan  check HEP and progress with ex as tolerated       Patient will benefit from skilled therapeutic intervention in order to improve the following deficits and impairments:  Abnormal gait, Improper body mechanics, Pain, Postural dysfunction, Increased muscle spasms, Decreased activity tolerance, Decreased endurance, Decreased range of motion, Decreased strength, Difficulty walking, Impaired flexibility  Visit Diagnosis: Acute bilateral low back pain without sciatica  Muscle spasm of back     Problem List Patient Active Problem List   Diagnosis Date Noted  . Bile salt-induced diarrhea 09/25/2018  . Arthritis of left acromioclavicular joint 09/23/2018  . Precordial chest pain 08/11/2018  . Morbid obesity ([place] 10/31/2017  . S/P right UKR, lateral 10/30/2017  . Incarcerated epigastric hernia 05/30/2015  . Left foot pain 08/09/2014  . Chronic diarrhea 07/27/2014  . Gout 08/10/2012  . Acid reflux 01/22/2012  . Down syndrome 11/11/2011  . Hernia of abdominal wall 10/21/2011  . Psoriasis 10/21/2011  . Type 2 diabetes mellitus, uncontrolled ([place] 04/12/2011  . Hypothyroid 04/12/2011  . Gait abnormality 06/22/2010  . Tibial tendinitis, posterior 06/22/2010    PAYSEUR,ANGIE PTA 10/20/2018, 4:04 PM  [place]  [address]Phone: [phone]  Fax:  [phone] Name: Wanda BEEBEMRN: [id number] of Birth: 1Jul 24, 1982

## 2018-10-21 ENCOUNTER — Ambulatory Visit: Payer: Medicare Other | Admitting: Sports Medicine

## 2018-10-22 ENCOUNTER — Other Ambulatory Visit: Payer: Self-pay

## 2018-10-22 ENCOUNTER — Ambulatory Visit: Payer: Medicare Other | Admitting: Physical Therapy

## 2018-10-22 DIAGNOSIS — M545 Low back pain, unspecified: Secondary | ICD-10-CM

## 2018-10-22 DIAGNOSIS — M62830 Muscle spasm of back: Secondary | ICD-10-CM

## 2018-10-22 NOTE — Therapy (Signed)
Wanda One[place], [place], [address] Phone: [phone]   Fax:  [phone]  Physical Therapy Treatment  Patient Details  Name: Wanda Oneill MRN: [id number] Date of Birth: 1980-08-05 Referring Provider (PT): Dr. Darene Lamer   Encounter Date: 10/22/2018  PT End of Session - 10/22/18 1600    Visit Number  8    Date for PT Re-Evaluation  11/29/18    PT Start Time  1530    PT Stop Time  1622    PT Time Calculation (min)  52 min       Past Medical History:  Diagnosis Date  . Diabetes mellitus    type II  . Down syndrome   . Family history of adverse reaction to anesthesia    mom has had n/v  . Gastritis   . GERD (gastroesophageal reflux disease)   . Headache   . Hepatic steatosis   . Hidradenitis   . Hypothyroidism   . Irritable bowel syndrome   . Periumbilical hernia   . Pneumonia   . Restless legs   . Tendonitis    chronic in left foot  . Thyroid disease    hypothyroidism    Past Surgical History:  Procedure Laterality Date  . AXILLARY HIDRADENITIS EXCISION Bilateral   . CHOLECYSTECTOMY    . HERNIA REPAIR     multiple  . INCISIONAL HERNIA REPAIR  05/30/2015   LAPROSCOPIC  . INCISIONAL HERNIA REPAIR N/A 05/30/2015   Procedure: LAPAROSCOPIC INCISIONAL HERNIA;  Surgeon: Rolm Bookbinder, MD;  Location: [place];  Service: General;  Laterality: N/A;  . INSERTION OF MESH N/A 05/30/2015   Procedure: INSERTION OF MESH;  Surgeon: Rolm Bookbinder, MD;  Location: [place];  Service: General;  Laterality: N/A;  . KNEE ARTHROSCOPY     right knee  . LAPAROSCOPY N/A 05/30/2015   Procedure: LAPAROSCOPY DIAGNOSTIC;  Surgeon: Rolm Bookbinder, MD;  Location: [facility];  Service: General;  Laterality: N/A;  . PARTIAL KNEE ARTHROPLASTY Right 10/30/2017   Procedure: UNICOMPARTMENTAL RIGHT KNEE LATERAL;  Surgeon: Paralee Cancel, MD;  [facility];  Service: Orthopedics;  Laterality: Right;  90 mins  . TONSILLECTOMY      adenoids    There were no vitals filed for this visit.  Subjective Assessment - 10/22/18 1533    Subjective  hanging in there. haven't tried ex yet at home but plan too. shld is good-no issues    Currently in Pain?  Yes    Pain Score  4     Pain Location  Back         [facility] PT Assessment - 10/22/18 0001      AROM   Overall AROM Comments  lumbar ROM limited 25%                   [facility] Adult PT Treatment/Exercise - 10/22/18 0001      Lumbar Exercises: Aerobic   Nustep  L 5 53mn      Lumbar Exercises: Machines for Strengthening   Cybex Lumbar Extension  flex and ext black tband 20 each    Cybex Knee Extension  10# 2 sets 10    Cybex Knee Flexion  25# 2 sets 10    Other Lumbar Machine Exercise  seated row 20# 2 sets 10      Lumbar Exercises: Seated   Other Seated Lumbar Exercises  weighted ball from chest out front for core stability, then weighted ball  from hip to hip, weighted ball overhead lift from chest level   cued to activate core     Lumbar Exercises: Supine   Clam  15 reps   red tband   Bridge with Ball Squeeze  15 reps;1 second;2 seconds    Other Supine Lumbar Exercises  Red Physio ball obl,KTC, bridge DL, ball squeezes with abs      Modalities   Modalities  Moist Heat;Electrical Stimulation      Moist Heat Therapy   Number Minutes Moist Heat  15 Minutes    Moist Heat Location  Lumbar Spine      Electrical Stimulation   Electrical Stimulation Location  lumbar area    Electrical Stimulation Action  IFC    Electrical Stimulation Parameters  supine    Electrical Stimulation Goals  Pain               PT Short Term Goals - 10/20/18 1557      PT SHORT TERM GOAL #1   Title  independent with initial HEP    Status  Achieved        PT Long Term Goals - 10/22/18 1558      PT LONG TERM GOAL #1   Title  understadn posture and body mechanics    Status  Partially Met      PT LONG TERM GOAL #2   Title  dcrease pain 50%    Status   Partially Met      PT LONG TERM GOAL #3   Status  Achieved      PT LONG TERM GOAL #4   Title  report able to cook a meal without sitting down    Status  On-going      PT LONG TERM GOAL #5   Title  walk with SPC or less device for home and community distances    Status  Achieved            Plan - 10/22/18 1605    Clinical Impression Statement  pt progressing with ther ex but still needing core activation cuing esp if ther ex is changed. progressing with goals    PT Treatment/Interventions  ADLs/Self Care Home Management;Cryotherapy;Electrical Stimulation;Iontophoresis 39m/ml Dexamethasone;Moist Heat;Ultrasound;Functional mobility training;Therapeutic activities;Patient/family education;Therapeutic exercise    PT Next Visit Plan  check HEP and progress with ex as tolerated       Patient will benefit from skilled therapeutic intervention in order to improve the following deficits and impairments:  Abnormal gait, Improper body mechanics, Pain, Postural dysfunction, Increased muscle spasms, Decreased activity tolerance, Decreased endurance, Decreased range of motion, Decreased strength, Difficulty walking, Impaired flexibility  Visit Diagnosis: Muscle spasm of back  Acute bilateral low back pain without sciatica     Problem List Patient Active Problem List   Diagnosis Date Noted  . Bile salt-induced diarrhea 09/25/2018  . Arthritis of left acromioclavicular joint 09/23/2018  . Precordial chest pain 08/11/2018  . Morbid obesity ([place] 10/31/2017  . S/P right UKR, lateral 10/30/2017  . Incarcerated epigastric hernia 05/30/2015  . Left foot pain 08/09/2014  . Chronic diarrhea 07/27/2014  . Gout 08/10/2012  . Acid reflux 01/22/2012  . Down syndrome 11/11/2011  . Hernia of abdominal wall 10/21/2011  . Psoriasis 10/21/2011  . Type 2 diabetes mellitus, uncontrolled ([place] 04/12/2011  . Hypothyroid 04/12/2011  . Gait abnormality 06/22/2010  . Tibial tendinitis, posterior  06/22/2010    ,ANGIE PTA 10/22/2018, 4:07 PM  [id number]  [facility] [place], [place], [address] Phone: [phone]   Fax:  [phone]  Name: Wanda Oneill MRN: [id number] Date of Birth: Apr 11, 1980

## 2018-10-26 ENCOUNTER — Other Ambulatory Visit: Payer: Medicare Other

## 2018-10-28 ENCOUNTER — Other Ambulatory Visit: Payer: Self-pay

## 2018-10-28 ENCOUNTER — Encounter: Payer: Self-pay | Admitting: Physical Therapy

## 2018-10-28 ENCOUNTER — Ambulatory Visit: Payer: Medicare Other | Admitting: Physical Therapy

## 2018-10-28 DIAGNOSIS — M545 Low back pain, unspecified: Secondary | ICD-10-CM

## 2018-10-28 DIAGNOSIS — M62830 Muscle spasm of back: Secondary | ICD-10-CM

## 2018-10-28 NOTE — Therapy (Signed)
[place] [address] [place], [place], [address] Phone: [phone]   Fax:  [phone]  Physical Therapy Treatment  Patient Details  Name: Wanda Oneill MRN: [id number] Date of Birth: October 22, 1980 Referring Provider (PT): Dr. Darene Lamer   Encounter Date: 10/28/2018  PT End of Session - 10/28/18 0959    Visit Number  9    PT Start Time  7654    PT Stop Time  1030    PT Time Calculation (min)  62 min    Activity Tolerance  Patient tolerated treatment well    Behavior During Therapy  [facility] for tasks assessed/performed       Past Medical History:  Diagnosis Date  . Diabetes mellitus    type II  . Down syndrome   . Family history of adverse reaction to anesthesia    mom has had n/v  . Gastritis   . GERD (gastroesophageal reflux disease)   . Headache   . Hepatic steatosis   . Hidradenitis   . Hypothyroidism   . Irritable bowel syndrome   . Periumbilical hernia   . Pneumonia   . Restless legs   . Tendonitis    chronic in left foot  . Thyroid disease    hypothyroidism    Past Surgical History:  Procedure Laterality Date  . AXILLARY HIDRADENITIS EXCISION Bilateral   . CHOLECYSTECTOMY    . HERNIA REPAIR     multiple  . INCISIONAL HERNIA REPAIR  05/30/2015   LAPROSCOPIC  . INCISIONAL HERNIA REPAIR N/A 05/30/2015   Procedure: LAPAROSCOPIC INCISIONAL HERNIA;  Surgeon: Rolm Bookbinder, MD;  Location: [facility];  Service: General;  Laterality: N/A;  . INSERTION OF MESH N/A 05/30/2015   Procedure: INSERTION OF MESH;  Surgeon: Rolm Bookbinder, MD;  Location: [place];  Service: General;  Laterality: N/A;  . KNEE ARTHROSCOPY     right knee  . LAPAROSCOPY N/A 05/30/2015   Procedure: LAPAROSCOPY DIAGNOSTIC;  Surgeon: Rolm Bookbinder, MD;  Location: [place];  Service: General;  Laterality: N/A;  . PARTIAL KNEE ARTHROPLASTY Right 10/30/2017   Procedure: UNICOMPARTMENTAL RIGHT KNEE LATERAL;  Surgeon: Paralee Cancel, MD;  [facility];  Service: Orthopedics;  Laterality: Right;  90 mins  . TONSILLECTOMY     adenoids    There were no vitals filed for this visit.  Subjective Assessment - 10/28/18 0943    Subjective  Patient reports that walking causes pain    Currently in Pain?  Yes    Pain Score  5     Pain Location  Back    Pain Orientation  Lower    Pain Descriptors / Indicators  Aching    Aggravating Factors   walking                       [facility] Adult PT Treatment/Exercise - 10/28/18 0001      Ambulation/Gait   Gait Comments  walked around the building outside, slow pace, one rest break halfway, coming up the hill she got SOB and put her hands on her back for support      Lumbar Exercises: Aerobic   Nustep  L 5 58mn      Lumbar Exercises: Machines for Strengthening   Cybex Lumbar Extension  flex and ext black tband 20 each    Cybex Knee Extension  10# 2 sets 10    Cybex Knee Flexion  25# 2 sets 10  Other Lumbar Machine Exercise  seated row 20# 2 sets 10, lats 20# 2x10      Lumbar Exercises: Supine   Pelvic Tilt  10 reps;5 seconds    Pelvic Tilt Limitations  a lot of cues to get this movement    Bridge with [employer]  15 reps;1 second;2 seconds    Other Supine Lumbar Exercises  feet on ball K2C, trunk rotation, small bridges and isometric abs      Modalities   Modalities  Moist Heat;Electrical Stimulation      Moist Heat Therapy   Number Minutes Moist Heat  15 Minutes    Moist Heat Location  Lumbar Spine      Electrical Stimulation   Electrical Stimulation Location  lumbar area    Electrical Stimulation Action  IFC    Electrical Stimulation Parameters  supine    Electrical Stimulation Goals  Pain               PT Short Term Goals - 10/20/18 1557      PT SHORT TERM GOAL #1   Title  independent with initial HEP    Status  Achieved        PT Long Term Goals - 10/28/18 1002      PT LONG TERM GOAL #2   Title  dcrease pain 50%    Status  Partially Met       PT LONG TERM GOAL #4   Title  report able to cook a meal without sitting down    Status  Partially Met            Plan - 10/28/18 1000    Clinical Impression Statement  Patient with difficulty going up the slope, SOB with this and at the end of the walk she had her hands on her low back to help "support" it due to pain she was having, she does well and tolerates the exerciss, she is just very weak in the core mms    PT Next Visit Plan  continue to try to get her to activate the core mms    Consulted and Agree with Plan of Care  Patient       Patient will benefit from skilled therapeutic intervention in order to improve the following deficits and impairments:  Abnormal gait, Improper body mechanics, Pain, Postural dysfunction, Increased muscle spasms, Decreased activity tolerance, Decreased endurance, Decreased range of motion, Decreased strength, Difficulty walking, Impaired flexibility  Visit Diagnosis: Muscle spasm of back  Acute bilateral low back pain without sciatica     Problem List Patient Active Problem List   Diagnosis Date Noted  . Bile salt-induced diarrhea 09/25/2018  . Arthritis of left acromioclavicular joint 09/23/2018  . Precordial chest pain 08/11/2018  . Morbid obesity ([place]) 10/31/2017  . S/P right UKR, lateral 10/30/2017  . Incarcerated epigastric hernia 05/30/2015  . Left foot pain 08/09/2014  . Chronic diarrhea 07/27/2014  . Gout 08/10/2012  . Acid reflux 01/22/2012  . Down syndrome 11/11/2011  . Hernia of abdominal wall 10/21/2011  . Psoriasis 10/21/2011  . Type 2 diabetes mellitus, uncontrolled ([place]) 04/12/2011  . Hypothyroid 04/12/2011  . Gait abnormality 06/22/2010  . Tibial tendinitis, posterior 06/22/2010    Sumner Boast., PT 10/28/2018, 10:03 AM  [address] [place], [place], [address] Phone: [phone]   Fax:  [phone]  Name: Wanda Oneill MRN: [id number] Date of Birth: 1980-07-29

## 2018-10-29 ENCOUNTER — Ambulatory Visit: Payer: Medicare Other | Admitting: Physical Therapy

## 2018-10-29 ENCOUNTER — Encounter: Payer: Self-pay | Admitting: Physical Therapy

## 2018-10-29 DIAGNOSIS — M545 Low back pain, unspecified: Secondary | ICD-10-CM

## 2018-10-29 DIAGNOSIS — M62830 Muscle spasm of back: Secondary | ICD-10-CM

## 2018-10-29 NOTE — Therapy (Signed)
[address] [place], [place], [address] Phone: [phone]   Fax:  [phone] Progress Note Reporting Period  09/29/18 to 10/29/18 for the first 10 visits  See note below for Objective Data and Assessment of Progress/Goals.      Physical Therapy Treatment  Patient Details  Name: Wanda Oneill MRN: [id number] Date of Birth: 1980-12-13 Referring Provider (PT): Dr. Darene Lamer   Encounter Date: 10/29/2018  PT End of Session - 10/29/18 1657    Visit Number  10    Date for PT Re-Evaluation  11/29/18    PT Start Time  1608    PT Stop Time  1700    PT Time Calculation (min)  52 min    Activity Tolerance  Patient tolerated treatment well    Behavior During Therapy  [facility] for tasks assessed/performed       Past Medical History:  Diagnosis Date  . Diabetes mellitus    type II  . Down syndrome   . Family history of adverse reaction to anesthesia    mom has had n/v  . Gastritis   . GERD (gastroesophageal reflux disease)   . Headache   . Hepatic steatosis   . Hidradenitis   . Hypothyroidism   . Irritable bowel syndrome   . Periumbilical hernia   . Pneumonia   . Restless legs   . Tendonitis    chronic in left foot  . Thyroid disease    hypothyroidism    Past Surgical History:  Procedure Laterality Date  . AXILLARY HIDRADENITIS EXCISION Bilateral   . CHOLECYSTECTOMY    . HERNIA REPAIR     multiple  . INCISIONAL HERNIA REPAIR  05/30/2015   LAPROSCOPIC  . INCISIONAL HERNIA REPAIR N/A 05/30/2015   Procedure: LAPAROSCOPIC INCISIONAL HERNIA;  Surgeon: Rolm Bookbinder, MD;  Location: [facility];  Service: General;  Laterality: N/A;  . INSERTION OF MESH N/A 05/30/2015   Procedure: INSERTION OF MESH;  Surgeon: Rolm Bookbinder, MD;  Location: [place];  Service: General;  Laterality: N/A;  . KNEE ARTHROSCOPY     right knee  . LAPAROSCOPY N/A 05/30/2015   Procedure: LAPAROSCOPY DIAGNOSTIC;  Surgeon: Rolm Bookbinder,  MD;  Location: [place];  Service: General;  Laterality: N/A;  . PARTIAL KNEE ARTHROPLASTY Right 10/30/2017   Procedure: UNICOMPARTMENTAL RIGHT KNEE LATERAL;  Surgeon: Paralee Cancel, MD;  [facility];  Service: Orthopedics;  Laterality: Right;  90 mins  . TONSILLECTOMY     adenoids    There were no vitals filed for this visit.  Subjective Assessment - 10/29/18 1615    Subjective  She reports that she is sore but felt like she did well with the last treatment    Currently in Pain?  Yes    Pain Score  5     Pain Location  Back    Pain Orientation  Lower                       [facility] Adult PT Treatment/Exercise - 10/29/18 0001      Lumbar Exercises: Aerobic   Nustep  L 5 45mn      Lumbar Exercises: Seated   Long Arc Quad on Chair  Both;2 sets;10 reps;Weights    LAQ on Chair Weights (lbs)  2.5    Other Seated Lumbar Exercises  green tband rows and extension, red tband HS curls      Lumbar Exercises: Supine  Pelvic Tilt  10 reps;5 seconds    Pelvic Tilt Limitations  a lot of cues to get this movement    Clam  15 reps    Bridge with [employer]  15 reps;1 second;2 seconds    Other Supine Lumbar Exercises  feet on ball K2C, trunk rotation, small bridges and isometric abs      Knee/Hip Exercises: Standing   Hip Flexion  Stengthening;Both;2 sets;10 reps;Knee straight    Hip Flexion Limitations  2.5#    Hip Abduction  Both;2 sets;10 reps;Stengthening;Knee straight    Abduction Limitations  2.5#    Hip Extension  Both;2 sets;10 reps;Stengthening;Knee straight    Extension Limitations  2.5#      Knee/Hip Exercises: Seated   Hamstring Curl  Both;2 sets;10 reps    Hamstring Limitations  green tband      Modalities   Modalities  Moist Heat;Electrical Stimulation      Moist Heat Therapy   Number Minutes Moist Heat  15 Minutes    Moist Heat Location  Lumbar Spine      Electrical Stimulation   Electrical Stimulation Location  lumbar area    Electrical Stimulation  Action  IFC    Electrical Stimulation Parameters  supine    Electrical Stimulation Goals  Pain               PT Short Term Goals - 10/20/18 1557      PT SHORT TERM GOAL #1   Title  independent with initial HEP    Status  Achieved        PT Long Term Goals - 10/29/18 1659      PT LONG TERM GOAL #1   Title  understadn posture and body mechanics    Status  Partially Met      PT LONG TERM GOAL #2   Title  dcrease pain 50%    Status  Partially Met            Plan - 10/29/18 1658    Clinical Impression Statement  Patient continues to need cues to activate the core mms, she does report having difficulty breathing in her mask so we did a lot of treatment today in a room where she could take her mask off and on as needed    PT Next Visit Plan  continue to try to get her to activate the core mms    Consulted and Agree with Plan of Care  Patient       Patient will benefit from skilled therapeutic intervention in order to improve the following deficits and impairments:  Abnormal gait, Improper body mechanics, Pain, Postural dysfunction, Increased muscle spasms, Decreased activity tolerance, Decreased endurance, Decreased range of motion, Decreased strength, Difficulty walking, Impaired flexibility  Visit Diagnosis: Muscle spasm of back  Acute bilateral low back pain without sciatica     Problem List Patient Active Problem List   Diagnosis Date Noted  . Bile salt-induced diarrhea 09/25/2018  . Arthritis of left acromioclavicular joint 09/23/2018  . Precordial chest pain 08/11/2018  . Morbid obesity ([place]) 10/31/2017  . S/P right UKR, lateral 10/30/2017  . Incarcerated epigastric hernia 05/30/2015  . Left foot pain 08/09/2014  . Chronic diarrhea 07/27/2014  . Gout 08/10/2012  . Acid reflux 01/22/2012  . Down syndrome 11/11/2011  . Hernia of abdominal wall 10/21/2011  . Psoriasis 10/21/2011  . Type 2 diabetes mellitus, uncontrolled ([place]) 04/12/2011  .  Hypothyroid 04/12/2011  . Gait abnormality 06/22/2010  . Tibial  tendinitis, posterior 06/22/2010    Sumner Boast., PT 10/29/2018, 5:00 PM  [address] [place], [place], [address] Phone: [phone]   Fax:  [phone]  Name: ZULEIMA HASER MRN: [id number] Date of Birth: 1980/02/10

## 2018-11-02 ENCOUNTER — Encounter: Payer: Self-pay | Admitting: Physical Therapy

## 2018-11-02 ENCOUNTER — Other Ambulatory Visit: Payer: Self-pay

## 2018-11-02 ENCOUNTER — Ambulatory Visit: Payer: Medicare Other | Admitting: Physical Therapy

## 2018-11-02 DIAGNOSIS — R262 Difficulty in walking, not elsewhere classified: Secondary | ICD-10-CM

## 2018-11-02 DIAGNOSIS — M545 Low back pain, unspecified: Secondary | ICD-10-CM

## 2018-11-02 DIAGNOSIS — M62830 Muscle spasm of back: Secondary | ICD-10-CM

## 2018-11-02 NOTE — Therapy (Signed)
[place] [address] [place], [place], [address] Phone: [phone]   Fax:  [phone]  Physical Therapy Treatment  Patient Details  Name: Wanda Oneill MRN: [id number] Date of Birth: 1980-02-19 Referring Provider (PT): Dr. Darene Lamer   Encounter Date: 11/02/2018  PT End of Session - 11/02/18 1601    Visit Number  11    Date for PT Re-Evaluation  11/29/18    Authorization Type  Medicare    PT Start Time  1528    PT Stop Time  1615    PT Time Calculation (min)  47 min    Activity Tolerance  Patient tolerated treatment well    Behavior During Therapy  [facility] for tasks assessed/performed       Past Medical History:  Diagnosis Date  . Diabetes mellitus    type II  . Down syndrome   . Family history of adverse reaction to anesthesia    mom has had n/v  . Gastritis   . GERD (gastroesophageal reflux disease)   . Headache   . Hepatic steatosis   . Hidradenitis   . Hypothyroidism   . Irritable bowel syndrome   . Periumbilical hernia   . Pneumonia   . Restless legs   . Tendonitis    chronic in left foot  . Thyroid disease    hypothyroidism    Past Surgical History:  Procedure Laterality Date  . AXILLARY HIDRADENITIS EXCISION Bilateral   . CHOLECYSTECTOMY    . HERNIA REPAIR     multiple  . INCISIONAL HERNIA REPAIR  05/30/2015   LAPROSCOPIC  . INCISIONAL HERNIA REPAIR N/A 05/30/2015   Procedure: LAPAROSCOPIC INCISIONAL HERNIA;  Surgeon: Rolm Bookbinder, MD;  Location: [place];  Service: General;  Laterality: N/A;  . INSERTION OF MESH N/A 05/30/2015   Procedure: INSERTION OF MESH;  Surgeon: Rolm Bookbinder, MD;  Location: [place];  Service: General;  Laterality: N/A;  . KNEE ARTHROSCOPY     right knee  . LAPAROSCOPY N/A 05/30/2015   Procedure: LAPAROSCOPY DIAGNOSTIC;  Surgeon: Rolm Bookbinder, MD;  Location: [place];  Service: General;  Laterality: N/A;  . PARTIAL KNEE ARTHROPLASTY Right 10/30/2017   Procedure:  UNICOMPARTMENTAL RIGHT KNEE LATERAL;  Surgeon: Paralee Cancel, MD;  [facility];  Service: Orthopedics;  Laterality: Right;  90 mins  . TONSILLECTOMY     adenoids    There were no vitals filed for this visit.  Subjective Assessment - 11/02/18 1527    Subjective  PAtients mom will be having surgery next week.    Currently in Pain?  Yes    Pain Score  5     Pain Location  Back    Pain Orientation  Lower    Aggravating Factors   standing                       [facility] Adult PT Treatment/Exercise - 11/02/18 0001      Ambulation/Gait   Gait Comments  walked around the building no rest      Lumbar Exercises: Aerobic   Nustep  L 5 46mn      Lumbar Exercises: Machines for Strengthening   Cybex Knee Extension  10# 2 sets 10    Cybex Knee Flexion  25# 2 sets 10    Other Lumbar Machine Exercise  seated row 20# 2 sets 10, lats 20# 2x10      Lumbar Exercises: Supine   Bridge with  Ball Squeeze  15 reps;1 second;2 seconds    Other Supine Lumbar Exercises  feet on ball K2C, trunk rotation, small bridges and isometric abs      Modalities   Modalities  Moist Heat;Electrical Stimulation      Moist Heat Therapy   Number Minutes Moist Heat  15 Minutes    Moist Heat Location  Lumbar Spine      Electrical Stimulation   Electrical Stimulation Location  lumbar area    Electrical Stimulation Action  IFC    Electrical Stimulation Parameters  supine    Electrical Stimulation Goals  Pain               PT Short Term Goals - 10/20/18 1557      PT SHORT TERM GOAL #1   Title  independent with initial HEP    Status  Achieved        PT Long Term Goals - 10/29/18 1659      PT LONG TERM GOAL #1   Title  understadn posture and body mechanics    Status  Partially Met      PT LONG TERM GOAL #2   Title  dcrease pain 50%    Status  Partially Met            Plan - 11/02/18 1601    Clinical Impression Statement  Patient's mom will be having surgery next week,  we will have to decrease to 1x/week due to transportaiton issues.  She is doing well with the things we ask her to do but continues to report pain, she has much more ability to walk without rest and without an significant increase of pain, she does report pain most of the time with walking and stnading    PT Next Visit Plan  continue to try to get her to activate the core mms    Consulted and Agree with Plan of Care  Patient;Family member/caregiver    Family Member Consulted  mom       Patient will benefit from skilled therapeutic intervention in order to improve the following deficits and impairments:  Abnormal gait, Improper body mechanics, Pain, Postural dysfunction, Increased muscle spasms, Decreased activity tolerance, Decreased endurance, Decreased range of motion, Decreased strength, Difficulty walking, Impaired flexibility  Visit Diagnosis: Muscle spasm of back  Acute bilateral low back pain without sciatica  Difficulty in walking, not elsewhere classified     Problem List Patient Active Problem List   Diagnosis Date Noted  . Bile salt-induced diarrhea 09/25/2018  . Arthritis of left acromioclavicular joint 09/23/2018  . Precordial chest pain 08/11/2018  . Morbid obesity ([place]) 10/31/2017  . S/P right UKR, lateral 10/30/2017  . Incarcerated epigastric hernia 05/30/2015  . Left foot pain 08/09/2014  . Chronic diarrhea 07/27/2014  . Gout 08/10/2012  . Acid reflux 01/22/2012  . Down syndrome 11/11/2011  . Hernia of abdominal wall 10/21/2011  . Psoriasis 10/21/2011  . Type 2 diabetes mellitus, uncontrolled ([place]) 04/12/2011  . Hypothyroid 04/12/2011  . Gait abnormality 06/22/2010  . Tibial tendinitis, posterior 06/22/2010    Sumner Boast., PT 11/02/2018, 4:04 PM  [address] [place], [place], [address] Phone: [phone]   Fax:  [phone]  Name: Wanda Oneill MRN: [id number] Date of  Birth: 20-Apr-1980

## 2018-11-05 ENCOUNTER — Other Ambulatory Visit: Payer: Self-pay

## 2018-11-05 ENCOUNTER — Ambulatory Visit: Payer: Medicare Other | Attending: Sports Medicine | Admitting: Physical Therapy

## 2018-11-05 DIAGNOSIS — R262 Difficulty in walking, not elsewhere classified: Secondary | ICD-10-CM | POA: Diagnosis present

## 2018-11-05 DIAGNOSIS — M545 Low back pain, unspecified: Secondary | ICD-10-CM

## 2018-11-05 DIAGNOSIS — M62830 Muscle spasm of back: Secondary | ICD-10-CM | POA: Insufficient documentation

## 2018-11-05 NOTE — Therapy (Signed)
[place], [place], [address] Phone: [phone]   Fax:  [phone]  Physical Therapy Treatment  Patient Details  Name: Wanda Oneill MRN: [id number] Date of Birth: [birth year] Referring Provider (PT): Dr. Darene Lamer   Encounter Date: 11/05/2018  PT End of Session - 11/05/18 1605    Visit Number  12    Date for PT Re-Evaluation  11/29/18    PT Start Time  1527    PT Stop Time  1620    PT Time Calculation (min)  53 min       Past Medical History:  Diagnosis Date  . Diabetes mellitus    type II  . Down syndrome   . Family history of adverse reaction to anesthesia    mom has had n/v  . Gastritis   . GERD (gastroesophageal reflux disease)   . Headache   . Hepatic steatosis   . Hidradenitis   . Hypothyroidism   . Irritable bowel syndrome   . Periumbilical hernia   . Pneumonia   . Restless legs   . Tendonitis    chronic in left foot  . Thyroid disease    hypothyroidism    Past Surgical History:  Procedure Laterality Date  . AXILLARY HIDRADENITIS EXCISION Bilateral   . CHOLECYSTECTOMY    . HERNIA REPAIR     multiple  . INCISIONAL HERNIA REPAIR  05/30/2015   LAPROSCOPIC  . INCISIONAL HERNIA REPAIR N/A 05/30/2015   Procedure: LAPAROSCOPIC INCISIONAL HERNIA;  Surgeon: Rolm Bookbinder, MD;  Location: [facility];  Service: General;  Laterality: N/A;  . INSERTION OF MESH N/A 05/30/2015   Procedure: INSERTION OF MESH;  Surgeon: Rolm Bookbinder, MD;  Location: [facility];  Service: General;  Laterality: N/A;  . KNEE ARTHROSCOPY     right knee  . LAPAROSCOPY N/A 05/30/2015   Procedure: LAPAROSCOPY DIAGNOSTIC;  Surgeon: Rolm Bookbinder, MD;  Location: [facility];  Service: General;  Laterality: N/A;  . PARTIAL KNEE ARTHROPLASTY Right 10/30/2017   Procedure: UNICOMPARTMENTAL RIGHT KNEE LATERAL;  Surgeon: Paralee Cancel, MD;  [facility];  Service: Orthopedics;  Laterality: Right;  90 mins  . TONSILLECTOMY      adenoids    There were no vitals filed for this visit.  Subjective Assessment - 11/05/18 1527    Subjective  I am hurting but okay. I am trying to walk more at home    Currently in Pain?  Yes    Pain Score  6     Pain Location  Back    Pain Orientation  Lower                       [facility] Adult PT Treatment/Exercise - 11/05/18 0001      Lumbar Exercises: Aerobic   Nustep  L 5 30mn      Lumbar Exercises: Machines for Strengthening   Cybex Lumbar Extension  flex and ext black tband 20 each    Cybex Knee Extension  10# 2 sets 10    Cybex Knee Flexion  25# 2 sets 10    Other Lumbar Machine Exercise  seated row 20# 2 sets 10, lats 20# 2x10      Lumbar Exercises: Supine   Ab Set  15 reps;3 seconds   done in sitting     Knee/Hip Exercises: Standing   Other Standing Knee Exercises  red tbnad hip 3 way 12 reps each  Knee/Hip Exercises: Seated   Ball Squeeze  15    Clamshell with TheraBand  Green      Theme park manager  lumbar area    Chartered certified accountant  IFC    Electrical Stimulation Parameters  supine    Electrical Stimulation Goals  Pain               PT Short Term Goals - 10/20/18 1557      PT SHORT TERM GOAL #1   Title  independent with initial HEP    Status  Achieved        PT Long Term Goals - 11/05/18 1606      PT LONG TERM GOAL #1   Title  understadn posture and body mechanics    Status  Partially Met      PT LONG TERM GOAL #2   Title  dcrease pain 50%    Status  Partially Met      PT LONG TERM GOAL #3   Title  increase lumbar ROM 25%    Status  Achieved      PT LONG TERM GOAL #4   Title  report able to cook a meal without sitting down    Baseline  varies with recipe    Status  Partially Met      PT LONG TERM GOAL #5   Title  walk with SPC or less device for home and community distances    Status  Achieved            Plan - 11/05/18 1607    Clinical  Impression Statement  pt continues to gain strength as noted by increased ease of ex and increased ability to do more at home without rest. progressing with goals    PT Treatment/Interventions  ADLs/Self Care Home Management;Cryotherapy;Electrical Stimulation;Iontophoresis 61m/ml Dexamethasone;Moist Heat;Ultrasound;Functional mobility training;Therapeutic activities;Patient/family education;Therapeutic exercise    PT Next Visit Plan  continue to try to get her to activate the core mms. pt will decrease to 1 x week for 2 weeks d/.t transportaion       Patient will benefit from skilled therapeutic intervention in order to improve the following deficits and impairments:  Abnormal gait, Improper body mechanics, Pain, Postural dysfunction, Increased muscle spasms, Decreased activity tolerance, Decreased endurance, Decreased range of motion, Decreased strength, Difficulty walking, Impaired flexibility  Visit Diagnosis: Muscle spasm of back  Acute bilateral low back pain without sciatica  Difficulty in walking, not elsewhere classified     Problem List Patient Active Problem List   Diagnosis Date Noted  . Bile salt-induced diarrhea 09/25/2018  . Arthritis of left acromioclavicular joint 09/23/2018  . Precordial chest pain 08/11/2018  . [place] 10/31/2017  . S/P right UKR, lateral 10/30/2017  . Incarcerated epigastric hernia 05/30/2015  . Left foot pain 08/09/2014  . Chronic diarrhea 07/27/2014  . Gout 08/10/2012  . Acid reflux 01/22/2012  . Down syndrome 11/11/2011  . Hernia of abdominal wall 10/21/2011  . Psoriasis 10/21/2011  . Type 2 diabetes mellitus, uncontrolled ([place] 04/12/2011  . Hypothyroid 04/12/2011  . Gait abnormality 06/22/2010  . Tibial tendinitis, posterior 06/22/2010    Catheline Hixon,ANGIE PTA 11/05/2018, 4:09 PM  [address] [address]Phone: [phone]  Fax:   [phone] Name: LTIMIYAH ROMITOMRN: [id number] of Birth: [birth year]

## 2018-11-12 ENCOUNTER — Other Ambulatory Visit: Payer: Self-pay

## 2018-11-12 ENCOUNTER — Ambulatory Visit: Payer: Medicare Other | Admitting: Physical Therapy

## 2018-11-12 DIAGNOSIS — M62830 Muscle spasm of back: Secondary | ICD-10-CM | POA: Diagnosis not present

## 2018-11-12 DIAGNOSIS — M545 Low back pain, unspecified: Secondary | ICD-10-CM

## 2018-11-12 DIAGNOSIS — R262 Difficulty in walking, not elsewhere classified: Secondary | ICD-10-CM

## 2018-11-12 NOTE — Therapy (Signed)
[place], [place], [address] Phone: [phone]   Fax:  [phone]  Physical Therapy Treatment  Patient Details  Name: Wanda Oneill MRN: [id number] Date of Birth: 07/31/1980 Referring Provider (PT): Dr. Darene Lamer   Encounter Date: 11/12/2018  PT End of Session - 11/12/18 1656    Visit Number  13    Date for PT Re-Evaluation  11/29/18    PT Start Time  1606    PT Stop Time  1707    PT Time Calculation (min)  61 min       Past Medical History:  Diagnosis Date  . Diabetes mellitus    type II  . Down syndrome   . Family history of adverse reaction to anesthesia    mom has had n/v  . Gastritis   . GERD (gastroesophageal reflux disease)   . Headache   . Hepatic steatosis   . Hidradenitis   . Hypothyroidism   . Irritable bowel syndrome   . Periumbilical hernia   . Pneumonia   . Restless legs   . Tendonitis    chronic in left foot  . Thyroid disease    hypothyroidism    Past Surgical History:  Procedure Laterality Date  . AXILLARY HIDRADENITIS EXCISION Bilateral   . CHOLECYSTECTOMY    . HERNIA REPAIR     multiple  . INCISIONAL HERNIA REPAIR  05/30/2015   LAPROSCOPIC  . INCISIONAL HERNIA REPAIR N/A 05/30/2015   Procedure: LAPAROSCOPIC INCISIONAL HERNIA;  Surgeon: Rolm Bookbinder, MD;  Location: [facility];  Service: General;  Laterality: N/A;  . INSERTION OF MESH N/A 05/30/2015   Procedure: INSERTION OF MESH;  Surgeon: Rolm Bookbinder, MD;  Location: [facility];  Service: General;  Laterality: N/A;  . KNEE ARTHROSCOPY     right knee  . LAPAROSCOPY N/A 05/30/2015   Procedure: LAPAROSCOPY DIAGNOSTIC;  Surgeon: Rolm Bookbinder, MD;  Location: [facility];  Service: General;  Laterality: N/A;  . PARTIAL KNEE ARTHROPLASTY Right 10/30/2017   Procedure: UNICOMPARTMENTAL RIGHT KNEE LATERAL;  Surgeon: Paralee Cancel, MD;  [facility];  Service: Orthopedics;  Laterality: Right;  90 mins  . TONSILLECTOMY      adenoids    There were no vitals filed for this visit.  Subjective Assessment - 11/12/18 1616    Subjective  I  fell on Monday, going up steps and knees gave away. overall 50% better with back pain    Currently in Pain?  Yes    Pain Score  5     Pain Location  Back                       [facility] Adult PT Treatment/Exercise - 11/12/18 0001      Ambulation/Gait   Gait Comments  walked around the building no rest   fatigued going up hill and had to hold back     Lumbar Exercises: Machines for Strengthening   Cybex Lumbar Extension  flex and ext black tband     Cybex Knee Extension  10# 2 sets 10    Cybex Knee Flexion  25# 2 sets 10    Other Lumbar Machine Exercise  seated row 20# 2 sets 10, lats 20# 2x10      Lumbar Exercises: Supine   Other Supine Lumbar Exercises  feet on ball K2C, trunk rotation, small bridges and isometric abs      Knee/Hip Exercises: Supine   Bridges  --  with ball      Moist Heat Therapy   Number Minutes Moist Heat  15 Minutes    Moist Heat Location  Lumbar Spine      Electrical Stimulation   Electrical Stimulation Location  lumbar area    Electrical Stimulation Action  IFC    Electrical Stimulation Parameters  supine    Electrical Stimulation Goals  Pain               PT Short Term Goals - 10/20/18 1557      PT SHORT TERM GOAL #1   Title  independent with initial HEP    Status  Achieved        PT Long Term Goals - 11/12/18 1700      PT LONG TERM GOAL #1   Title  understadn posture and body mechanics    Status  Partially Met      PT LONG TERM GOAL #2   Title  dcrease pain 50%    Status  Achieved      PT LONG TERM GOAL #3   Title  increase lumbar ROM 25%    Status  Achieved      PT LONG TERM GOAL #4   Title  report able to cook a meal without sitting down    Baseline  varies with recipe    Status  Partially Met            Plan - 11/12/18 1654    Clinical Impression Statement  Pt tolerated  treatment well as demonstrated  with no increase in pain during interventions. Pt requires cues for seated rows and lat pull downs for improved posture. Pt is progressing toward goals.Pt di have difficulty with walk around building and coming up hill had to support back, pt states fall earlier in week.       Patient will benefit from skilled therapeutic intervention in order to improve the following deficits and impairments:  Abnormal gait, Improper body mechanics, Pain, Postural dysfunction, Increased muscle spasms, Decreased activity tolerance, Decreased endurance, Decreased range of motion, Decreased strength, Difficulty walking, Impaired flexibility  Visit Diagnosis: Muscle spasm of back  Acute bilateral low back pain without sciatica  Difficulty in walking, not elsewhere classified     Problem List Patient Active Problem List   Diagnosis Date Noted  . Bile salt-induced diarrhea 09/25/2018  . Arthritis of left acromioclavicular joint 09/23/2018  . Precordial chest pain 08/11/2018  . Morbid obesity ([place]) 10/31/2017  . S/P right UKR, lateral 10/30/2017  . Incarcerated epigastric hernia 05/30/2015  . Left foot pain 08/09/2014  . Chronic diarrhea 07/27/2014  . Gout 08/10/2012  . Acid reflux 01/22/2012  . Down syndrome 11/11/2011  . Hernia of abdominal wall 10/21/2011  . Psoriasis 10/21/2011  . Type 2 diabetes mellitus, uncontrolled ([place]) 04/12/2011  . Hypothyroid 04/12/2011  . Gait abnormality 06/22/2010  . Tibial tendinitis, posterior 06/22/2010    ,ANGIE PTA 11/12/2018, 5:03 PM  [address] [place], [place], [address] Phone: [phone]   Fax:  [phone]  Name: WALLIS SPIZZIRRI MRN: [id number] Date of Birth: 05-Jan-1981

## 2018-11-18 ENCOUNTER — Other Ambulatory Visit: Payer: Self-pay

## 2018-11-18 ENCOUNTER — Encounter: Payer: Self-pay | Admitting: Physical Therapy

## 2018-11-18 ENCOUNTER — Ambulatory Visit: Payer: Medicare Other | Admitting: Physical Therapy

## 2018-11-18 DIAGNOSIS — M62830 Muscle spasm of back: Secondary | ICD-10-CM | POA: Diagnosis not present

## 2018-11-18 DIAGNOSIS — R262 Difficulty in walking, not elsewhere classified: Secondary | ICD-10-CM

## 2018-11-18 DIAGNOSIS — M545 Low back pain, unspecified: Secondary | ICD-10-CM

## 2018-11-18 NOTE — Therapy (Signed)
[place], [place], [address] Phone: [phone]   Fax:  [phone]  Physical Therapy Treatment  Patient Details  Name: Wanda Oneill MRN: [id number] Date of Birth: [birth year] Referring Provider (PT): Dr. Darene Lamer   Encounter Date: 11/18/2018  PT End of Session - 11/18/18 1603    Visit Number  14    Date for PT Re-Evaluation  11/29/18    Authorization Type  Medicare    PT Start Time  1526    PT Stop Time  1622    PT Time Calculation (min)  56 min    Activity Tolerance  Patient tolerated treatment well    Behavior During Therapy  [facility] for tasks assessed/performed       Past Medical History:  Diagnosis Date  . Diabetes mellitus    type II  . Down syndrome   . Family history of adverse reaction to anesthesia    mom has had n/v  . Gastritis   . GERD (gastroesophageal reflux disease)   . Headache   . Hepatic steatosis   . Hidradenitis   . Hypothyroidism   . Irritable bowel syndrome   . Periumbilical hernia   . Pneumonia   . Restless legs   . Tendonitis    chronic in left foot  . Thyroid disease    hypothyroidism    Past Surgical History:  Procedure Laterality Date  . AXILLARY HIDRADENITIS EXCISION Bilateral   . CHOLECYSTECTOMY    . HERNIA REPAIR     multiple  . INCISIONAL HERNIA REPAIR  05/30/2015   LAPROSCOPIC  . INCISIONAL HERNIA REPAIR N/A 05/30/2015   Procedure: LAPAROSCOPIC INCISIONAL HERNIA;  Surgeon: Rolm Bookbinder, MD;  Location: [facility];  Service: General;  Laterality: N/A;  . INSERTION OF MESH N/A 05/30/2015   Procedure: INSERTION OF MESH;  Surgeon: Rolm Bookbinder, MD;  Location: [facility];  Service: General;  Laterality: N/A;  . KNEE ARTHROSCOPY     right knee  . LAPAROSCOPY N/A 05/30/2015   Procedure: LAPAROSCOPY DIAGNOSTIC;  Surgeon: Rolm Bookbinder, MD;  Location: [facility];  Service: General;  Laterality: N/A;  . PARTIAL KNEE ARTHROPLASTY Right 10/30/2017   Procedure:  UNICOMPARTMENTAL RIGHT KNEE LATERAL;  Surgeon: Paralee Cancel, MD;  [facility];  Service: Orthopedics;  Laterality: Right;  90 mins  . TONSILLECTOMY     adenoids    There were no vitals filed for this visit.  Subjective Assessment - 11/18/18 1537    Subjective  Patient reports that she walked some today and she is sore in the back    Currently in Pain?  Yes    Pain Score  7     Pain Location  Back    Pain Orientation  Lower    Aggravating Factors   walking and standing                       [facility] Adult PT Treatment/Exercise - 11/18/18 0001      Ambulation/Gait   Gait Comments  walk around the building one rest break      Lumbar Exercises: Aerobic   Nustep  L 5 66min      Lumbar Exercises: Machines for Strengthening   Cybex Lumbar Extension  flex and ext black tband     Cybex Knee Extension  10# 2 sets 10    Cybex Knee Flexion  25# 2 sets 10    Other Lumbar Machine Exercise  seated row 20# 2 sets 10, lats 20# 2x10      Modalities   Modalities  Moist Heat;Electrical Stimulation      Moist Heat Therapy   Number Minutes Moist Heat  15 Minutes    Moist Heat Location  Lumbar Spine      Electrical Stimulation   Electrical Stimulation Location  lumbar area    Electrical Stimulation Action  IFC    Electrical Stimulation Parameters  supine    Electrical Stimulation Goals  Pain      Manual Therapy   Manual Therapy  Soft tissue mobilization    Soft tissue mobilization  to the lumbar paraspinals, some use of vibration               PT Short Term Goals - 10/20/18 1557      PT SHORT TERM GOAL #1   Title  independent with initial HEP    Status  Achieved        PT Long Term Goals - 11/18/18 1605      PT LONG TERM GOAL #1   Title  understadn posture and body mechanics    Status  Achieved            Plan - 11/18/18 1604    Clinical Impression Statement  Patient with some increased c/o soreness and tightness in teh low back, she does  report being up more, added some STM with vibration to ease this.  She did need to rest on the lap around the building today    PT Next Visit Plan  will continue 1x/week and work on function       Patient will benefit from skilled therapeutic intervention in order to improve the following deficits and impairments:  Abnormal gait, Improper body mechanics, Pain, Postural dysfunction, Increased muscle spasms, Decreased activity tolerance, Decreased endurance, Decreased range of motion, Decreased strength, Difficulty walking, Impaired flexibility  Visit Diagnosis: Muscle spasm of back  Acute bilateral low back pain without sciatica  Difficulty in walking, not elsewhere classified     Problem List Patient Active Problem List   Diagnosis Date Noted  . Bile salt-induced diarrhea 09/25/2018  . Arthritis of left acromioclavicular joint 09/23/2018  . Precordial chest pain 08/11/2018  . Morbid obesity ([place]) 10/31/2017  . S/P right UKR, lateral 10/30/2017  . Incarcerated epigastric hernia 05/30/2015  . Left foot pain 08/09/2014  . Chronic diarrhea 07/27/2014  . Gout 08/10/2012  . Acid reflux 01/22/2012  . Down syndrome 11/11/2011  . Hernia of abdominal wall 10/21/2011  . Psoriasis 10/21/2011  . Type 2 diabetes mellitus, uncontrolled ([place]) 04/12/2011  . Hypothyroid 04/12/2011  . Gait abnormality 06/22/2010  . Tibial tendinitis, posterior 06/22/2010    Sumner Boast., PT 11/18/2018, 4:12 PM  [address] [place], [place], [address] Phone: [phone]   Fax:  [phone]  Name: SHAYNAH TERZIAN MRN: [id number] Date of Birth: 1980-11-28

## 2018-11-27 ENCOUNTER — Other Ambulatory Visit: Payer: Self-pay

## 2018-11-27 ENCOUNTER — Ambulatory Visit: Payer: Medicare Other | Admitting: Physical Therapy

## 2018-11-27 DIAGNOSIS — M62830 Muscle spasm of back: Secondary | ICD-10-CM

## 2018-11-27 NOTE — Therapy (Signed)
[place], [place], [address] Phone: [phone]   Fax:  [phone]  Physical Therapy Treatment  Patient Details  Name: Wanda Oneill MRN: [id number] Date of Birth: 05/16/1980 Referring Provider (PT): Dr. Darene Lamer   Encounter Date: 11/27/2018  PT End of Session - 11/27/18 1138    Visit Number  15    PT Start Time  1638    PT Stop Time  4665    PT Time Calculation (min)  55 min       Past Medical History:  Diagnosis Date  . Diabetes mellitus    type II  . Down syndrome   . Family history of adverse reaction to anesthesia    mom has had n/v  . Gastritis   . GERD (gastroesophageal reflux disease)   . Headache   . Hepatic steatosis   . Hidradenitis   . Hypothyroidism   . Irritable bowel syndrome   . Periumbilical hernia   . Pneumonia   . Restless legs   . Tendonitis    chronic in left foot  . Thyroid disease    hypothyroidism    Past Surgical History:  Procedure Laterality Date  . AXILLARY HIDRADENITIS EXCISION Bilateral   . CHOLECYSTECTOMY    . HERNIA REPAIR     multiple  . INCISIONAL HERNIA REPAIR  05/30/2015   LAPROSCOPIC  . INCISIONAL HERNIA REPAIR N/A 05/30/2015   Procedure: LAPAROSCOPIC INCISIONAL HERNIA;  Surgeon: Rolm Bookbinder, MD;  Location: [facility];  Service: General;  Laterality: N/A;  . INSERTION OF MESH N/A 05/30/2015   Procedure: INSERTION OF MESH;  Surgeon: Rolm Bookbinder, MD;  Location: [facility];  Service: General;  Laterality: N/A;  . KNEE ARTHROSCOPY     right knee  . LAPAROSCOPY N/A 05/30/2015   Procedure: LAPAROSCOPY DIAGNOSTIC;  Surgeon: Rolm Bookbinder, MD;  Location: [facility];  Service: General;  Laterality: N/A;  . PARTIAL KNEE ARTHROPLASTY Right 10/30/2017   Procedure: UNICOMPARTMENTAL RIGHT KNEE LATERAL;  Surgeon: Paralee Cancel, MD;  [facility];  Service: Orthopedics;  Laterality: Right;  90 mins  . TONSILLECTOMY     adenoids    There were no  vitals filed for this visit.  Subjective Assessment - 11/27/18 1055    Subjective  Yes pain in the back    Currently in Pain?  Yes    Pain Score  6     Pain Location  Back                       [facility] Adult PT Treatment/Exercise - 11/27/18 0001      Lumbar Exercises: Aerobic   Nustep  L 5 57mn      Lumbar Exercises: Machines for Strengthening   Other Lumbar Machine Exercise  seated row 20# 2 sets 10, lats 20# 2x10      Lumbar Exercises: Seated   Sit to Stand  10 reps   w/ OHP and chest press     Knee/Hip Exercises: Standing   Hip Abduction  Both;2 sets;10 reps;Stengthening;Knee straight    Abduction Limitations  w/ red tband    Hip Extension  Both;2 sets;10 reps;Stengthening;Knee straight    Extension Limitations  w/ red tband      Modalities   Modalities  Moist Heat;Electrical Stimulation      Moist Heat Therapy   Number Minutes Moist Heat  15 Minutes    Moist Heat Location  Lumbar Spine  Theme park manager  lumbar area    Chartered certified accountant  --    Electrical Stimulation Parameters  supine    Electrical Stimulation Goals  Pain               PT Short Term Goals - 10/20/18 1557      PT SHORT TERM GOAL #1   Title  independent with initial HEP    Status  Achieved        PT Long Term Goals - 11/27/18 1056      PT LONG TERM GOAL #4   Title  report able to cook a meal without sitting down    Status  Partially Met            Plan - 11/27/18 1135    Clinical Impression Statement  pt tolerated treatment well as demonstrated by no increase in pain during interventions. Pt requires breaks with standing exercises and STS due to fatigue and SOB. renewal versus discharge.    Rehab Potential  Good    PT Frequency  2x / week    PT Duration  8 weeks    PT Treatment/Interventions  ADLs/Self Care Home Management;Cryotherapy;Electrical Stimulation;Iontophoresis 5m/ml Dexamethasone;Moist  Heat;Ultrasound;Functional mobility training;Therapeutic activities;Patient/family education;Therapeutic exercise    PT Next Visit Plan  will continue 1x/week and work on function       Patient will benefit from skilled therapeutic intervention in order to improve the following deficits and impairments:  Abnormal gait, Improper body mechanics, Pain, Postural dysfunction, Increased muscle spasms, Decreased activity tolerance, Decreased endurance, Decreased range of motion, Decreased strength, Difficulty walking, Impaired flexibility  Visit Diagnosis: Muscle spasm of back     Problem List Patient Active Problem List   Diagnosis Date Noted  . Bile salt-induced diarrhea 09/25/2018  . Arthritis of left acromioclavicular joint 09/23/2018  . Precordial chest pain 08/11/2018  . Morbid obesity ([place] 10/31/2017  . S/P right UKR, lateral 10/30/2017  . Incarcerated epigastric hernia 05/30/2015  . Left foot pain 08/09/2014  . Chronic diarrhea 07/27/2014  . Gout 08/10/2012  . Acid reflux 01/22/2012  . Down syndrome 11/11/2011  . Hernia of abdominal wall 10/21/2011  . Psoriasis 10/21/2011  . Type 2 diabetes mellitus, uncontrolled ([place] 04/12/2011  . Hypothyroid 04/12/2011  . Gait abnormality 06/22/2010  . Tibial tendinitis, posterior 06/22/2010    SBarrett Henle10/23/2020, 11:53 AM  [address] [address]Phone: [place]  Fax:  [phone] Name: Wanda MULKERNMRN: [id number] of Birth: 1Jun 14, 1982

## 2018-12-01 ENCOUNTER — Encounter: Payer: Self-pay | Admitting: Physical Therapy

## 2018-12-01 ENCOUNTER — Other Ambulatory Visit: Payer: Self-pay

## 2018-12-01 ENCOUNTER — Ambulatory Visit: Payer: Medicare Other | Admitting: Physical Therapy

## 2018-12-01 DIAGNOSIS — M545 Low back pain, unspecified: Secondary | ICD-10-CM

## 2018-12-01 DIAGNOSIS — R262 Difficulty in walking, not elsewhere classified: Secondary | ICD-10-CM

## 2018-12-01 DIAGNOSIS — M62830 Muscle spasm of back: Secondary | ICD-10-CM

## 2018-12-01 NOTE — Therapy (Signed)
[place], [place], [address] Phone: [phone]   Fax:  [phone]  Physical Therapy Treatment  Patient Details  Name: Wanda Oneill MRN: [id number] Date of Birth: 01-May-1980 Referring Provider (PT): Dr. Darene Lamer   Encounter Date: 12/01/2018  PT End of Session - 12/01/18 1514    Visit Number  16    Date for PT Re-Evaluation  01/01/19    Authorization Type  Medicare    PT Start Time  1446    PT Stop Time  1533    PT Time Calculation (min)  47 min    Activity Tolerance  Patient tolerated treatment well    Behavior During Therapy  [facility] for tasks assessed/performed       Past Medical History:  Diagnosis Date  . Diabetes mellitus    type II  . Down syndrome   . Family history of adverse reaction to anesthesia    mom has had n/v  . Gastritis   . GERD (gastroesophageal reflux disease)   . Headache   . Hepatic steatosis   . Hidradenitis   . Hypothyroidism   . Irritable bowel syndrome   . Periumbilical hernia   . Pneumonia   . Restless legs   . Tendonitis    chronic in left foot  . Thyroid disease    hypothyroidism    Past Surgical History:  Procedure Laterality Date  . AXILLARY HIDRADENITIS EXCISION Bilateral   . CHOLECYSTECTOMY    . HERNIA REPAIR     multiple  . INCISIONAL HERNIA REPAIR  05/30/2015   LAPROSCOPIC  . INCISIONAL HERNIA REPAIR N/A 05/30/2015   Procedure: LAPAROSCOPIC INCISIONAL HERNIA;  Surgeon: Rolm Bookbinder, MD;  Location: [place];  Service: General;  Laterality: N/A;  . INSERTION OF MESH N/A 05/30/2015   Procedure: INSERTION OF MESH;  Surgeon: Rolm Bookbinder, MD;  Location: [place];  Service: General;  Laterality: N/A;  . KNEE ARTHROSCOPY     right knee  . LAPAROSCOPY N/A 05/30/2015   Procedure: LAPAROSCOPY DIAGNOSTIC;  Surgeon: Rolm Bookbinder, MD;  Location: [place];  Service: General;  Laterality: N/A;  . PARTIAL KNEE ARTHROPLASTY Right 10/30/2017   Procedure:  UNICOMPARTMENTAL RIGHT KNEE LATERAL;  Surgeon: Paralee Cancel, MD;  [facility];  Service: Orthopedics;  Laterality: Right;  90 mins  . TONSILLECTOMY     adenoids    There were no vitals filed for this visit.  Subjective Assessment - 12/01/18 1502    Subjective  Patient reports that "I know I am getting better, but I still get tired and hurt as the day goes"    Currently in Pain?  Yes    Pain Score  5     Pain Location  Back    Pain Orientation  Lower    Aggravating Factors   worse as the day goes    Pain Relieving Factors  [place]                       [facility] Adult PT Treatment/Exercise - 12/01/18 0001      Lumbar Exercises: Aerobic   UBE (Upper Arm Bike)  1 min fwd/ 1 min back L 3 standing    Nustep  L 5 13min      Lumbar Exercises: Machines for Strengthening   Cybex Lumbar Extension  flex and ext black tband     Cybex Knee Extension  10# 2 sets 10  Other Lumbar Machine Exercise  seated row 20# 2 sets 10, lats 20# 2x10      Lumbar Exercises: Seated   Other Seated Lumbar Exercises  weighted ball from chest out front for core stability, then weighted ball from hip to hip, weighted ball overhead lift from chest level      Lumbar Exercises: Supine   Other Supine Lumbar Exercises  feet on ball K2C, trunk rotation, small bridges and isometric abs      Knee/Hip Exercises: Standing   Hip Flexion  Stengthening;Both;2 sets;10 reps;Knee straight    Hip Flexion Limitations  2.5#    Hip Abduction  Both;2 sets;10 reps;Stengthening;Knee straight    Abduction Limitations  2.5#    Hip Extension  Both;2 sets;10 reps;Stengthening;Knee straight    Extension Limitations  2.5#      Modalities   Modalities  Moist Heat;Electrical Stimulation      Moist Heat Therapy   Number Minutes Moist Heat  15 Minutes    Moist Heat Location  Lumbar Spine      Electrical Stimulation   Electrical Stimulation Location  lumbar area    Electrical Stimulation Action  IFC    Electrical  Stimulation Parameters  supine    Electrical Stimulation Goals  Pain               PT Short Term Goals - 10/20/18 1557      PT SHORT TERM GOAL #1   Title  independent with initial HEP    Status  Achieved        PT Long Term Goals - 12/01/18 1517      Additional Long Term Goals   Additional Long Term Goals  Yes      PT LONG TERM GOAL #6   Title  indepednent and safe with her gym at the apartment    Time  4    Period  [place] - 12/01/18 1515    Clinical Impression Statement  Patient reports that she is getting stronger, we have decreased to 1x/week and talked about her ability to do things if we discharge her, we decided that she needs to set aside time for herself to exercise, she does report having a gym at her apartment complex    PT Frequency  2x / week    PT Duration  4 weeks    PT Next Visit Plan  I will renew and ask that she take some pictures of her gym and we can start to educate her on the machines that she can use the weights and form    Consulted and Agree with Plan of Care  Patient       Patient will benefit from skilled therapeutic intervention in order to improve the following deficits and impairments:  Abnormal gait, Improper body mechanics, Pain, Postural dysfunction, Increased muscle spasms, Decreased activity tolerance, Decreased endurance, Decreased range of motion, Decreased strength, Difficulty walking, Impaired flexibility  Visit Diagnosis: Muscle spasm of back  Acute bilateral low back pain without sciatica  Difficulty in walking, not elsewhere classified     Problem List Patient Active Problem List   Diagnosis Date Noted  . Bile salt-induced diarrhea 09/25/2018  . Arthritis of left acromioclavicular joint 09/23/2018  . Precordial chest pain 08/11/2018  . Morbid obesity ([place]) 10/31/2017  . S/P right UKR, lateral 10/30/2017  . Incarcerated epigastric hernia 05/30/2015  . Left foot pain  08/09/2014   . Chronic diarrhea 07/27/2014  . Gout 08/10/2012  . Acid reflux 01/22/2012  . Down syndrome 11/11/2011  . Hernia of abdominal wall 10/21/2011  . Psoriasis 10/21/2011  . Type 2 diabetes mellitus, uncontrolled ([place]) 04/12/2011  . Hypothyroid 04/12/2011  . Gait abnormality 06/22/2010  . Tibial tendinitis, posterior 06/22/2010    Sumner Boast., PT 12/01/2018, 3:18 PM  [address] [place], [place], [address] Phone: [phone]   Fax:  [phone]  Name: Wanda Oneill MRN: [id number] Date of Birth: 1980/03/30

## 2018-12-01 NOTE — Addendum Note (Signed)
Addended by: Sumner Boast on: 12/01/2018 05:12 PM   Modules accepted: Orders

## 2018-12-09 ENCOUNTER — Encounter: Payer: Medicare Other | Admitting: Physical Therapy

## 2018-12-10 ENCOUNTER — Other Ambulatory Visit: Payer: Self-pay

## 2018-12-10 ENCOUNTER — Ambulatory Visit: Payer: Medicare Other | Attending: Sports Medicine | Admitting: Physical Therapy

## 2018-12-10 DIAGNOSIS — M62830 Muscle spasm of back: Secondary | ICD-10-CM | POA: Diagnosis not present

## 2018-12-10 DIAGNOSIS — M545 Low back pain: Secondary | ICD-10-CM | POA: Insufficient documentation

## 2018-12-10 DIAGNOSIS — R262 Difficulty in walking, not elsewhere classified: Secondary | ICD-10-CM | POA: Diagnosis present

## 2018-12-10 NOTE — Therapy (Signed)
[place], [place], [address] Phone: [phone]   Fax:  [phone]  Physical Therapy Treatment  Patient Details  Name: Wanda Oneill MRN: [id number] Date of Birth: 29-Apr-1980 Referring Provider (PT): Dr. Darene Lamer   Encounter Date: 12/10/2018  PT End of Session - 12/10/18 1611    Visit Number  71    PT Start Time  6962    PT Stop Time  9528    PT Time Calculation (min)  54 min       Past Medical History:  Diagnosis Date  . Diabetes mellitus    type II  . Down syndrome   . Family history of adverse reaction to anesthesia    mom has had n/v  . Gastritis   . GERD (gastroesophageal reflux disease)   . Headache   . Hepatic steatosis   . Hidradenitis   . Hypothyroidism   . Irritable bowel syndrome   . Periumbilical hernia   . Pneumonia   . Restless legs   . Tendonitis    chronic in left foot  . Thyroid disease    hypothyroidism    Past Surgical History:  Procedure Laterality Date  . AXILLARY HIDRADENITIS EXCISION Bilateral   . CHOLECYSTECTOMY    . HERNIA REPAIR     multiple  . INCISIONAL HERNIA REPAIR  05/30/2015   LAPROSCOPIC  . INCISIONAL HERNIA REPAIR N/A 05/30/2015   Procedure: LAPAROSCOPIC INCISIONAL HERNIA;  Surgeon: Rolm Bookbinder, MD;  Location: [place];  Service: General;  Laterality: N/A;  . INSERTION OF MESH N/A 05/30/2015   Procedure: INSERTION OF MESH;  Surgeon: Rolm Bookbinder, MD;  Location: [place];  Service: General;  Laterality: N/A;  . KNEE ARTHROSCOPY     right knee  . LAPAROSCOPY N/A 05/30/2015   Procedure: LAPAROSCOPY DIAGNOSTIC;  Surgeon: Rolm Bookbinder, MD;  Location: [place];  Service: General;  Laterality: N/A;  . PARTIAL KNEE ARTHROPLASTY Right 10/30/2017   Procedure: UNICOMPARTMENTAL RIGHT KNEE LATERAL;  Surgeon: Paralee Cancel, MD;  [facility];  Service: Orthopedics;  Laterality: Right;  90 mins  . TONSILLECTOMY     adenoids    There were no  vitals filed for this visit.  Subjective Assessment - 12/10/18 1537    Subjective  "in pain but alive". pt states she has been working out on zoom calls.    Currently in Pain?  Yes    Pain Score  6     Pain Location  Back    Pain Orientation  Lower                       [facility] Adult PT Treatment/Exercise - 12/10/18 0001      Lumbar Exercises: Aerobic   Nustep  L6 75mn      Lumbar Exercises: Standing   Row  Both;20 reps;Strengthening   10#   Shoulder Extension  Both;20 reps;Strengthening   10#   Other Standing Lumbar Exercises  lumbar extension on pulley 10#, OHP and chest press w/ weighted ball      Lumbar Exercises: Supine   Other Supine Lumbar Exercises  feet on ball K2C, trunk rotation, small bridges x 10      Moist Heat Therapy   Number Minutes Moist Heat  15 Minutes    Moist Heat Location  Lumbar Spine      Electrical Stimulation   Electrical Stimulation Location  lumbar area    Electrical Stimulation  Action  --   IFC   Electrical Stimulation Parameters  supine    Electrical Stimulation Goals  Pain               PT Short Term Goals - 10/20/18 1557      PT SHORT TERM GOAL #1   Title  independent with initial HEP    Status  Achieved        PT Long Term Goals - 12/10/18 1614      PT LONG TERM GOAL #4   Title  report able to cook a meal without sitting down    Status  Partially Met      PT LONG TERM GOAL #6   Title  indepednent and safe with her gym at the apartment    Status  Partially Met            Plan - 12/10/18 1612    Clinical Impression Statement  pt continues to work on decreasing back pain. pt was progressed to standing rows and shoulder ext. pt states it causes a aching in her back, but overall handled it well. pt needs cues for rows and shoulder extension to stand up straight.    Stability/Clinical Decision Making  Stable/Uncomplicated    Rehab Potential  Good    PT Frequency  2x / week    PT Duration  4 weeks     PT Treatment/Interventions  ADLs/Self Care Home Management;Cryotherapy;Electrical Stimulation;Iontophoresis 74m/ml Dexamethasone;Moist Heat;Ultrasound;Functional mobility training;Therapeutic activities;Patient/family education;Therapeutic exercise    PT Next Visit Plan  continue to strengthen core and back       Patient will benefit from skilled therapeutic intervention in order to improve the following deficits and impairments:  Abnormal gait, Improper body mechanics, Pain, Postural dysfunction, Increased muscle spasms, Decreased activity tolerance, Decreased endurance, Decreased range of motion, Decreased strength, Difficulty walking, Impaired flexibility  Visit Diagnosis: Muscle spasm of back     Problem List Patient Active Problem List   Diagnosis Date Noted  . Bile salt-induced diarrhea 09/25/2018  . Arthritis of left acromioclavicular joint 09/23/2018  . Precordial chest pain 08/11/2018  . Morbid obesity ([place] 10/31/2017  . S/P right UKR, lateral 10/30/2017  . Incarcerated epigastric hernia 05/30/2015  . Left foot pain 08/09/2014  . Chronic diarrhea 07/27/2014  . Gout 08/10/2012  . Acid reflux 01/22/2012  . Down syndrome 11/11/2011  . Hernia of abdominal wall 10/21/2011  . Psoriasis 10/21/2011  . [place] 2 diabetes mellitus, [place] 04/12/2011  . Hypothyroid 04/12/2011  . Gait abnormality 06/22/2010  . Tibial tendinitis, posterior 06/22/2010    SBarrett Henle SPTA 12/10/2018, 4:19 PM  [address] [address]Phone: [phone]  Fax:  [phone] Name: Wanda SHADDUCKMRN: [id number] of Birth: 131-Aug-1982

## 2018-12-11 ENCOUNTER — Ambulatory Visit: Payer: Medicare Other | Admitting: Cardiology

## 2018-12-15 ENCOUNTER — Other Ambulatory Visit: Payer: Self-pay

## 2018-12-15 ENCOUNTER — Ambulatory Visit: Payer: Medicare Other | Admitting: Physical Therapy

## 2018-12-15 ENCOUNTER — Encounter: Payer: Self-pay | Admitting: Physical Therapy

## 2018-12-15 DIAGNOSIS — M62830 Muscle spasm of back: Secondary | ICD-10-CM | POA: Diagnosis not present

## 2018-12-15 DIAGNOSIS — M545 Low back pain, unspecified: Secondary | ICD-10-CM

## 2018-12-15 DIAGNOSIS — R262 Difficulty in walking, not elsewhere classified: Secondary | ICD-10-CM

## 2018-12-15 NOTE — Therapy (Signed)
[place], [place], [address] Phone: [phone]   Fax:  [phone]  Physical Therapy Treatment  Patient Details  Name: Wanda Oneill MRN: [id number] Date of Birth: [birth year] Referring Provider (PT): Dr. Darene Lamer   Encounter Date: 12/15/2018  PT End of Session - 12/15/18 1613    Visit Number  18    Date for PT Re-Evaluation  01/01/19    PT Start Time  1435    PT Stop Time  1535    PT Time Calculation (min)  60 min    Activity Tolerance  Patient tolerated treatment well    Behavior During Therapy  [facility] for tasks assessed/performed       Past Medical History:  Diagnosis Date  . Diabetes mellitus    type II  . Down syndrome   . Family history of adverse reaction to anesthesia    mom has had n/v  . Gastritis   . GERD (gastroesophageal reflux disease)   . Headache   . Hepatic steatosis   . Hidradenitis   . Hypothyroidism   . Irritable bowel syndrome   . Periumbilical hernia   . Pneumonia   . Restless legs   . Tendonitis    chronic in left foot  . Thyroid disease    hypothyroidism    Past Surgical History:  Procedure Laterality Date  . AXILLARY HIDRADENITIS EXCISION Bilateral   . CHOLECYSTECTOMY    . HERNIA REPAIR     multiple  . INCISIONAL HERNIA REPAIR  05/30/2015   LAPROSCOPIC  . INCISIONAL HERNIA REPAIR N/A 05/30/2015   Procedure: LAPAROSCOPIC INCISIONAL HERNIA;  Surgeon: Rolm Bookbinder, MD;  Location: [place];  Service: General;  Laterality: N/A;  . INSERTION OF MESH N/A 05/30/2015   Procedure: INSERTION OF MESH;  Surgeon: Rolm Bookbinder, MD;  Location: [place];  Service: General;  Laterality: N/A;  . KNEE ARTHROSCOPY     right knee  . LAPAROSCOPY N/A 05/30/2015   Procedure: LAPAROSCOPY DIAGNOSTIC;  Surgeon: Rolm Bookbinder, MD;  Location: [place];  Service: General;  Laterality: N/A;  . PARTIAL KNEE ARTHROPLASTY Right 10/30/2017   Procedure: UNICOMPARTMENTAL RIGHT KNEE  LATERAL;  Surgeon: Paralee Cancel, MD;  [facility];  Service: Orthopedics;  Laterality: Right;  90 mins  . TONSILLECTOMY     adenoids    There were no vitals filed for this visit.  Subjective Assessment - 12/15/18 1449    Subjective  Patient reports that she is feeling well, still c/o pain in the back with walking    Currently in Pain?  Yes    Pain Score  5     Pain Location  Back    Pain Orientation  Lower    Aggravating Factors   walking and as the day goes on                       [facility] Adult PT Treatment/Exercise - 12/15/18 0001      Lumbar Exercises: Aerobic   Recumbent Bike  x 5 minutes    Other Aerobic Exercise  gait outside x 3 minutes      Lumbar Exercises: Machines for Strengthening   Cybex Knee Extension  10# 2 sets 10    Cybex Knee Flexion  25# 2 sets 10    Other Lumbar Machine Exercise  seated row 20# 2 sets 10, lats 20# 2x10    Other Lumbar Machine Exercise  patient brought  in pictures of the gym equipment that she has access to and we went over them and talked about weight and reps and how to adjust      Lumbar Exercises: Supine   Bridge with Ball Squeeze  15 reps;1 second;2 seconds    Other Supine Lumbar Exercises  feet on ball K2C, trunk rotation, small bridges x 10      Moist Heat Therapy   Number Minutes Moist Heat  15 Minutes    Moist Heat Location  Lumbar Spine      Electrical Stimulation   Electrical Stimulation Location  lumbar area    Electrical Stimulation Action  IFC    Electrical Stimulation Parameters  supine    Electrical Stimulation Goals  Pain               PT Short Term Goals - 10/20/18 1557      PT SHORT TERM GOAL #1   Title  independent with initial HEP    Status  Achieved        PT Long Term Goals - 12/10/18 1614      PT LONG TERM GOAL #4   Title  report able to cook a meal without sitting down    Status  Partially Met      PT LONG TERM GOAL #6   Title  indepednent and safe with her gym at the  apartment    Status  Partially [place] - 12/15/18 1614    Clinical Impression Statement  Patient is doing well, she brought in the pictures of her gym and I did my best with her to explain the equipment and the weight and the reps and the form.  She reports that she felt like she could do this but did ask some good questions.    PT Next Visit Plan  continue to strengthen core and back    Consulted and Agree with Plan of Care  Patient       Patient will benefit from skilled therapeutic intervention in order to improve the following deficits and impairments:  Abnormal gait, Improper body mechanics, Pain, Postural dysfunction, Increased muscle spasms, Decreased activity tolerance, Decreased endurance, Decreased range of motion, Decreased strength, Difficulty walking, Impaired flexibility  Visit Diagnosis: Muscle spasm of back  Acute bilateral low back pain without sciatica  Difficulty in walking, not elsewhere classified     Problem List Patient Active Problem List   Diagnosis Date Noted  . Bile salt-induced diarrhea 09/25/2018  . Arthritis of left acromioclavicular joint 09/23/2018  . Precordial chest pain 08/11/2018  . Morbid obesity ([place]) 10/31/2017  . S/P right UKR, lateral 10/30/2017  . Incarcerated epigastric hernia 05/30/2015  . Left foot pain 08/09/2014  . Chronic diarrhea 07/27/2014  . Gout 08/10/2012  . Acid reflux 01/22/2012  . Down syndrome 11/11/2011  . Hernia of abdominal wall 10/21/2011  . Psoriasis 10/21/2011  . Type 2 diabetes mellitus, uncontrolled ([place]) 04/12/2011  . Hypothyroid 04/12/2011  . Gait abnormality 06/22/2010  . Tibial tendinitis, posterior 06/22/2010    Sumner Boast., PT 12/15/2018, 4:16 PM  [place] [address] [place], [place], [address] Phone: [phone]   Fax:  [phone]  Name: Wanda Oneill MRN: [id number] Date of Birth: 04-22-1980

## 2018-12-22 ENCOUNTER — Encounter: Payer: Self-pay | Admitting: Physical Therapy

## 2018-12-22 ENCOUNTER — Other Ambulatory Visit: Payer: Self-pay

## 2018-12-22 ENCOUNTER — Ambulatory Visit: Payer: Medicare Other | Admitting: Physical Therapy

## 2018-12-22 DIAGNOSIS — M62830 Muscle spasm of back: Secondary | ICD-10-CM | POA: Diagnosis not present

## 2018-12-22 DIAGNOSIS — R262 Difficulty in walking, not elsewhere classified: Secondary | ICD-10-CM

## 2018-12-22 DIAGNOSIS — M545 Low back pain, unspecified: Secondary | ICD-10-CM

## 2018-12-22 NOTE — Therapy (Signed)
[place], [place], [address] Phone: [phone]   Fax:  [phone]  Physical Therapy Treatment  Patient Details  Name: Wanda Oneill MRN: [id number] Date of Birth: [birth year] Referring Provider (PT): Dr. Darene Lamer   Encounter Date: 12/22/2018  PT End of Session - 12/22/18 1513    Visit Number  19    Date for PT Re-Evaluation  01/01/19    Authorization Type  Medicare    PT Start Time  1440    PT Stop Time  1530    PT Time Calculation (min)  50 min    Activity Tolerance  Patient tolerated treatment well    Behavior During Therapy  [facility] for tasks assessed/performed       Past Medical History:  Diagnosis Date  . Diabetes mellitus    type II  . Down syndrome   . Family history of adverse reaction to anesthesia    mom has had n/v  . Gastritis   . GERD (gastroesophageal reflux disease)   . Headache   . Hepatic steatosis   . Hidradenitis   . Hypothyroidism   . Irritable bowel syndrome   . Periumbilical hernia   . Pneumonia   . Restless legs   . Tendonitis    chronic in left foot  . Thyroid disease    hypothyroidism    Past Surgical History:  Procedure Laterality Date  . AXILLARY HIDRADENITIS EXCISION Bilateral   . CHOLECYSTECTOMY    . HERNIA REPAIR     multiple  . INCISIONAL HERNIA REPAIR  05/30/2015   LAPROSCOPIC  . INCISIONAL HERNIA REPAIR N/A 05/30/2015   Procedure: LAPAROSCOPIC INCISIONAL HERNIA;  Surgeon: Rolm Bookbinder, MD;  Location: [place];  Service: General;  Laterality: N/A;  . INSERTION OF MESH N/A 05/30/2015   Procedure: INSERTION OF MESH;  Surgeon: Rolm Bookbinder, MD;  Location: [place];  Service: General;  Laterality: N/A;  . KNEE ARTHROSCOPY     right knee  . LAPAROSCOPY N/A 05/30/2015   Procedure: LAPAROSCOPY DIAGNOSTIC;  Surgeon: Rolm Bookbinder, MD;  Location: [place] [facility];  Service: General;  Laterality: N/A;  . PARTIAL KNEE ARTHROPLASTY Right 10/30/2017   Procedure:  UNICOMPARTMENTAL RIGHT KNEE LATERAL;  Surgeon: Paralee Cancel, MD;  [facility];  Service: Orthopedics;  Laterality: Right;  90 mins  . TONSILLECTOMY     adenoids    There were no vitals filed for this visit.  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  Yes    Pain Score  6     Pain Location  Back    Pain Orientation  Lower    Pain Descriptors / Indicators  Aching    Aggravating Factors   walking and standing    Pain Relieving Factors  reports that the treatment really feels good                       [facility] Adult PT Treatment/Exercise - 12/22/18 0001      Lumbar Exercises: Aerobic   Recumbent Bike  x 5 minutes    Nustep  L5 61mn      Lumbar Exercises: Machines for Strengthening   Cybex Knee Extension  5# 3x10    Cybex Knee Flexion  25# 3  sets 10    Other Lumbar Machine Exercise  seated row 20# 2 sets 10, lats 20# 2x10      Lumbar Exercises: Supine   Ab Set  15 reps;3 seconds    Bridge with [employer]  15 reps;1 second;2 seconds    Other Supine Lumbar Exercises  feet on ball K2C, trunk rotation, small bridges x 10      Knee/Hip Exercises: Stretches   Passive Hamstring Stretch  Both;2 reps;20 seconds    Piriformis Stretch  Both;2 reps;20 seconds      Knee/Hip Exercises: Standing   Hip Flexion  Stengthening;Both;2 sets;10 reps;Knee straight    Hip Flexion Limitations  3#    Hip Abduction  Both;2 sets;10 reps;Stengthening;Knee straight    Abduction Limitations  3#    Hip Extension  Both;2 sets;10 reps;Stengthening;Knee straight    Extension Limitations  3#               PT Short Term Goals - 10/20/18 1557      PT SHORT TERM GOAL #1   Title  independent with initial HEP    Status  Achieved        PT Long Term Goals - 12/22/18 1515      PT LONG TERM GOAL  #1   Title  understadn posture and body mechanics    Status  Achieved      PT LONG TERM GOAL #2   Title  dcrease pain 50%      PT LONG TERM GOAL #3   Status  Achieved      PT LONG TERM GOAL #4   Title  report able to cook a meal without sitting down    Status  Partially Met      PT LONG TERM GOAL #5   Title  walk with SPC or less device for home and community distances    Status  Achieved            Plan - 12/22/18 1513    Clinical Impression Statement  Patient reports that she is tired due to her walking some at schoold and doing some more activities.  She is tight and tender in the T/L area with spasms, she needs a lot of cues to do the exercises correctly    PT Next Visit Plan  continue to strengthen core and back    Consulted and Agree with Plan of Care  Patient       Patient will benefit from skilled therapeutic intervention in order to improve the following deficits and impairments:  Abnormal gait, Improper body mechanics, Pain, Postural dysfunction, Increased muscle spasms, Decreased activity tolerance, Decreased endurance, Decreased range of motion, Decreased strength, Difficulty walking, Impaired flexibility  Visit Diagnosis: Muscle spasm of back  Acute bilateral low back pain without sciatica  Difficulty in walking, not elsewhere classified     Problem List Patient Active Problem List   Diagnosis Date Noted  . Bile salt-induced diarrhea 09/25/2018  . Arthritis of left acromioclavicular joint 09/23/2018  . Precordial chest pain 08/11/2018  . Morbid obesity ([place]) 10/31/2017  . S/P right UKR, lateral 10/30/2017  . Incarcerated epigastric hernia 05/30/2015  . Left foot pain 08/09/2014  . Chronic diarrhea 07/27/2014  . Gout 08/10/2012  . Acid reflux 01/22/2012  . Down syndrome 11/11/2011  . Hernia of abdominal wall 10/21/2011  . Psoriasis 10/21/2011  . Type 2 diabetes mellitus, uncontrolled ([place]) 04/12/2011  . Hypothyroid 04/12/2011  . Gait  abnormality 06/22/2010  . Tibial  tendinitis, posterior 06/22/2010    Sumner Boast., PT 12/22/2018, 3:16 PM  [place] [address] [place], [place], [address] Phone: [phone]   Fax:  [phone]  Name: Wanda Oneill MRN: [id number] Date of Birth: [birth year]

## 2018-12-29 ENCOUNTER — Ambulatory Visit: Payer: Medicare Other | Admitting: Physical Therapy

## 2018-12-29 ENCOUNTER — Other Ambulatory Visit: Payer: Self-pay

## 2018-12-29 DIAGNOSIS — M62830 Muscle spasm of back: Secondary | ICD-10-CM

## 2018-12-29 NOTE — Therapy (Signed)
[place], [place], [address] Phone: [phone]   Fax:  [phone]  Physical Therapy Treatment  Patient Details  Name: Wanda Oneill MRN: [id number] Date of Birth: 02/19/1980 Referring Provider (PT): Dr. Darene Lamer   Encounter Date: 12/29/2018  PT End of Session - 12/29/18 1532    Visit Number  20    PT Start Time  0737    PT Stop Time  1545    PT Time Calculation (min)  60 min       Past Medical History:  Diagnosis Date  . Diabetes mellitus    type II  . Down syndrome   . Family history of adverse reaction to anesthesia    mom has had n/v  . Gastritis   . GERD (gastroesophageal reflux disease)   . Headache   . Hepatic steatosis   . Hidradenitis   . Hypothyroidism   . Irritable bowel syndrome   . Periumbilical hernia   . Pneumonia   . Restless legs   . Tendonitis    chronic in left foot  . Thyroid disease    hypothyroidism    Past Surgical History:  Procedure Laterality Date  . AXILLARY HIDRADENITIS EXCISION Bilateral   . CHOLECYSTECTOMY    . HERNIA REPAIR     multiple  . INCISIONAL HERNIA REPAIR  05/30/2015   LAPROSCOPIC  . INCISIONAL HERNIA REPAIR N/A 05/30/2015   Procedure: LAPAROSCOPIC INCISIONAL HERNIA;  Surgeon: Rolm Bookbinder, MD;  Location: [facility];  Service: General;  Laterality: N/A;  . INSERTION OF MESH N/A 05/30/2015   Procedure: INSERTION OF MESH;  Surgeon: Rolm Bookbinder, MD;  Location: [facility];  Service: General;  Laterality: N/A;  . KNEE ARTHROSCOPY     right knee  . LAPAROSCOPY N/A 05/30/2015   Procedure: LAPAROSCOPY DIAGNOSTIC;  Surgeon: Rolm Bookbinder, MD;  Location: [facility];  Service: General;  Laterality: N/A;  . PARTIAL KNEE ARTHROPLASTY Right 10/30/2017   Procedure: UNICOMPARTMENTAL RIGHT KNEE LATERAL;  Surgeon: Paralee Cancel, MD;  Location: [facility];  Service: Orthopedics;  Laterality: Right;  90 mins  . TONSILLECTOMY     adenoids    There were no  vitals filed for this visit.  Subjective Assessment - 12/29/18 1449    Subjective  "feeling tired today"    Currently in Pain?  Yes    Pain Score  6     Pain Location  Back                       [facility] Adult PT Treatment/Exercise - 12/29/18 0001      Lumbar Exercises: Aerobic   Recumbent Bike  x 5 minutes    Nustep  L5 66mn      Lumbar Exercises: Machines for Strengthening   Cybex Knee Extension  5#  2 x 15    Cybex Knee Flexion  25#  2 x 15    Other Lumbar Machine Exercise  seated row 20# 2 sets 10, lats 20# 2x15    Other Lumbar Machine Exercise  lumbar ext w/ black tband 2 x 10      Moist Heat Therapy   Number Minutes Moist Heat  15 Minutes    Moist Heat Location  Lumbar Spine      Electrical Stimulation   Electrical Stimulation Location  lumbar area    Electrical Stimulation Action  IFC    Electrical Stimulation Parameters  supine  Electrical Stimulation Goals  Pain               PT Short Term Goals - 10/20/18 1557      PT SHORT TERM GOAL #1   Title  independent with initial HEP    Status  Achieved        PT Long Term Goals - 12/29/18 1450      PT LONG TERM GOAL #4   Title  report able to cook a meal without sitting down    Status  Partially Met      PT LONG TERM GOAL #6   Title  indepednent and safe with her gym at the apartment            Plan - 12/29/18 1535    Clinical Impression Statement  pt reports she is tired after a long day of school. patient has met all goals except for standing while cooking depending on what she is making and independent with apartment gym due to lack of time to use it. pt reports pain with knee flexion and extension. pt needs cues with knee flexion and extension to improve form and technique. pt is being discharged today.    Stability/Clinical Decision Making  Stable/Uncomplicated    Rehab Potential  Good    PT Frequency  2x / week    PT Duration  4 weeks    PT Treatment/Interventions   ADLs/Self Care Home Management;Cryotherapy;Electrical Stimulation;Iontophoresis 78m/ml Dexamethasone;Moist Heat;Ultrasound;Functional mobility training;Therapeutic activities;Patient/family education;Therapeutic exercise    PT Next Visit Plan  discharge home with HEP       Patient will benefit from skilled therapeutic intervention in order to improve the following deficits and impairments:  Abnormal gait, Improper body mechanics, Pain, Postural dysfunction, Increased muscle spasms, Decreased activity tolerance, Decreased endurance, Decreased range of motion, Decreased strength, Difficulty walking, Impaired flexibility  Visit Diagnosis: Muscle spasm of back     Problem List Patient Active Problem List   Diagnosis Date Noted  . Bile salt-induced diarrhea 09/25/2018  . Arthritis of left acromioclavicular joint 09/23/2018  . Precordial chest pain 08/11/2018  . Morbid obesity ([place] 10/31/2017  . S/P right UKR, lateral 10/30/2017  . Incarcerated epigastric hernia 05/30/2015  . Left foot pain 08/09/2014  . Chronic diarrhea 07/27/2014  . Gout 08/10/2012  . Acid reflux 01/22/2012  . Down syndrome 11/11/2011  . Hernia of abdominal wall 10/21/2011  . Psoriasis 10/21/2011  . Type 2 diabetes mellitus, uncontrolled ([place] 04/12/2011  . Hypothyroid 04/12/2011  . Gait abnormality 06/22/2010  . Tibial tendinitis, posterior 06/22/2010    SBarrett Henle SPTA 12/29/2018, 3:40 PM  [address] [address]Phone: [phone]  Fax:  [phone] Name: LAARIONNA GERMERMRN: [id number] of Birth: [birth year]

## 2019-02-01 ENCOUNTER — Other Ambulatory Visit: Payer: Self-pay | Admitting: Family Medicine

## 2019-02-01 DIAGNOSIS — M25511 Pain in right shoulder: Secondary | ICD-10-CM

## 2019-02-03 ENCOUNTER — Ambulatory Visit (INDEPENDENT_AMBULATORY_CARE_PROVIDER_SITE_OTHER): Payer: Medicare Other

## 2019-02-03 ENCOUNTER — Other Ambulatory Visit: Payer: Self-pay

## 2019-02-03 ENCOUNTER — Encounter: Payer: Self-pay | Admitting: Sports Medicine

## 2019-02-03 ENCOUNTER — Ambulatory Visit (INDEPENDENT_AMBULATORY_CARE_PROVIDER_SITE_OTHER): Payer: Medicare Other | Admitting: Sports Medicine

## 2019-02-03 DIAGNOSIS — M7551 Bursitis of right shoulder: Secondary | ICD-10-CM | POA: Insufficient documentation

## 2019-02-03 DIAGNOSIS — M19012 Primary osteoarthritis, left shoulder: Secondary | ICD-10-CM | POA: Diagnosis not present

## 2019-02-03 NOTE — Assessment & Plan Note (Signed)
Resolved after injection in August.

## 2019-02-03 NOTE — Assessment & Plan Note (Signed)
Injection as above, x-rays, formal physical therapy. Return to see me in 4 weeks.

## 2019-02-03 NOTE — Progress Notes (Signed)
Subjective:    I'm seeing this patient as a consultation for: Dr. Janett Billow Copland  CC: Right shoulder pain  HPI: Wanda Oneill returns, she is a delightful 38 year old female, she for some time now she is had right shoulder pain, localized over the deltoid and worse with abduction, overhead activities, moderate, persistent, no radiation.  Of note her left shoulder is doing well after an acromioclavicular injection back in the summertime.  I reviewed the past medical history, family history, social history, surgical history, and allergies today and no changes were needed.  Please see the problem list section below in epic for further details.  Past Medical History: Past Medical History:  Diagnosis Date  . Diabetes mellitus    type II  . Down syndrome   . Family history of adverse reaction to anesthesia    mom has had n/v  . Gastritis   . GERD (gastroesophageal reflux disease)   . Headache   . Hepatic steatosis   . Hidradenitis   . Hypothyroidism   . Irritable bowel syndrome   . Periumbilical hernia   . Pneumonia   . Restless legs   . Tendonitis    chronic in left foot  . Thyroid disease    hypothyroidism   Past Surgical History: Past Surgical History:  Procedure Laterality Date  . AXILLARY HIDRADENITIS EXCISION Bilateral   . CHOLECYSTECTOMY    . HERNIA REPAIR     multiple  . INCISIONAL HERNIA REPAIR  05/30/2015   LAPROSCOPIC  . INCISIONAL HERNIA REPAIR N/A 05/30/2015   Procedure: LAPAROSCOPIC INCISIONAL HERNIA;  Surgeon: Rolm Bookbinder, MD;  Location: [facility];  Service: General;  Laterality: N/A;  . INSERTION OF MESH N/A 05/30/2015   Procedure: INSERTION OF MESH;  Surgeon: Rolm Bookbinder, MD;  Location: [place];  Service: General;  Laterality: N/A;  . KNEE ARTHROSCOPY     right knee  . LAPAROSCOPY N/A 05/30/2015   Procedure: LAPAROSCOPY DIAGNOSTIC;  Surgeon: Rolm Bookbinder, MD;  Location: [facility];  Service: General;  Laterality: N/A;  . PARTIAL KNEE ARTHROPLASTY Right  10/30/2017   Procedure: UNICOMPARTMENTAL RIGHT KNEE LATERAL;  Surgeon: Paralee Cancel, MD;  Location[facility];  Service: Orthopedics;  Laterality: Right;  90 mins  . TONSILLECTOMY     adenoids   Social History: Social History   Socioeconomic History  . Marital status: Single    Spouse name: Not on file  . Number of children: Not on file  . Years of education: Not on file  . Highest education level: Not on file  Occupational History  . Occupation: disabled  Tobacco Use  . Smoking status: Never Smoker  . Smokeless tobacco: Never Used  Substance and Sexual Activity  . Alcohol use: No    Alcohol/week: 0.0 standard drinks  . Drug use: No  . Sexual activity: Yes    Birth control/protection: Pill  Other Topics Concern  . Not on file  Social History Narrative  . Not on file   Social Determinants of Health   Financial Resource Strain:   . Difficulty of Paying Living Expenses: Not on file  Food Insecurity:   . Worried About Charity fundraiser in the Last Year: Not on file  . Ran Out of Food in the Last Year: Not on file  Transportation Needs:   . Lack of Transportation (Medical): Not on file  . Lack of Transportation (Non-Medical): Not on file  Physical Activity:   . Days of Exercise per Week: Not on file  .  Minutes of Exercise per Session: Not on file  Stress:   . Feeling of Stress : Not on file  Social Connections:   . Frequency of Communication with Friends and Family: Not on file  . Frequency of Social Gatherings with Friends and Family: Not on file  . Attends Religious Services: Not on file  . Active Member of Clubs or Organizations: Not on file  . Attends Archivist Meetings: Not on file  . Marital Status: Not on file   Family History: Family History  Problem Relation Age of Onset  . Thyroid cancer Mother   . Diabetes type II Mother   . High Cholesterol Mother   . Heart disease Mother   . Diabetes type II Father   . Hypertension Father   . Stroke  Father    Allergies: Allergies  Allergen Reactions  . Vancomycin Itching  . Cefuroxime Hives and Swelling    Unspecified location of swelling  . Cefuroxime Axetil Hives  . Sulfa Antibiotics Hives   Medications: See med rec.  Review of Systems: No headache, visual changes, nausea, vomiting, diarrhea, constipation, dizziness, abdominal pain, skin rash, fevers, chills, night sweats, weight loss, swollen lymph nodes, body aches, joint swelling, muscle aches, chest pain, shortness of breath, mood changes, visual or auditory hallucinations.   Objective:   General: Well Developed, well nourished, and in no acute distress.  Neuro:  Extra-ocular muscles intact, able to move all 4 extremities, sensation grossly intact.  Deep tendon reflexes tested were normal. Psych: Alert and oriented, mood congruent with affect. ENT:  Ears and nose appear unremarkable.  Hearing grossly normal. Neck: Unremarkable overall appearance, trachea midline.  No visible thyroid enlargement. Eyes: Conjunctivae and lids appear unremarkable.  Pupils equal and round. Skin: Warm and dry, no rashes noted.  Cardiovascular: Pulses palpable, no extremity edema. Right shoulder: Inspection reveals no abnormalities, atrophy or asymmetry. Palpation is normal with no tenderness over AC joint or bicipital groove. ROM is full in all planes. Rotator cuff strength normal throughout. Positive Neer and Hawkin's tests, empty can. Speeds and Yergason's tests normal. No labral pathology noted with negative Obrien's, negative crank, negative clunk, and good stability. Normal scapular function observed. No painful arc and no drop arm sign. No apprehension sign  Procedure: Real-time Ultrasound Guided injection of the right subacromial bursa Device: Samsung HS60  Verbal informed consent obtained.  Time-out conducted.  Noted no overlying erythema, induration, or other signs of local infection.  Skin prepped in a sterile fashion.  Local  anesthesia: Topical Ethyl chloride.  With sterile technique and under real time ultrasound guidance: 1 cc Kenalog 40, 1 cc lidocaine, 1 cc bupivacaine injected easily Completed without difficulty  Pain immediately resolved suggesting accurate placement of the medication.  Advised to call if fevers/chills, erythema, induration, drainage, or persistent bleeding.  Images permanently stored and available for review in the ultrasound unit.  Impression: Technically successful ultrasound guided injection.  Impression and Recommendations:   This case required medical decision making of moderate complexity.  Acute shoulder bursitis, right Injection as above, x-rays, formal physical therapy. Return to see me in 4 weeks.  Arthritis of left acromioclavicular joint Resolved after injection in August.   ___________________________________________ Gwen Her. Dianah Field, M.D., ABFM., CAQSM. Primary Care and Sports Medicine [facility]  Adjunct Professor of [employer] [place] [employer] [facility] [employer]

## 2019-02-05 ENCOUNTER — Other Ambulatory Visit: Payer: Self-pay | Admitting: Family Medicine

## 2019-02-17 ENCOUNTER — Ambulatory Visit: Payer: Medicare HMO | Attending: Sports Medicine | Admitting: Physical Therapy

## 2019-02-17 ENCOUNTER — Other Ambulatory Visit: Payer: Self-pay

## 2019-02-17 ENCOUNTER — Encounter: Payer: Self-pay | Admitting: Physical Therapy

## 2019-02-17 DIAGNOSIS — R293 Abnormal posture: Secondary | ICD-10-CM

## 2019-02-17 DIAGNOSIS — M25511 Pain in right shoulder: Secondary | ICD-10-CM | POA: Diagnosis present

## 2019-02-17 DIAGNOSIS — M25611 Stiffness of right shoulder, not elsewhere classified: Secondary | ICD-10-CM | POA: Diagnosis present

## 2019-02-17 DIAGNOSIS — M62830 Muscle spasm of back: Secondary | ICD-10-CM | POA: Insufficient documentation

## 2019-02-17 DIAGNOSIS — M545 Low back pain: Secondary | ICD-10-CM | POA: Insufficient documentation

## 2019-02-17 DIAGNOSIS — R262 Difficulty in walking, not elsewhere classified: Secondary | ICD-10-CM | POA: Insufficient documentation

## 2019-02-17 NOTE — Therapy (Signed)
[place], [place], [address] Phone: [phone]   Fax:  [phone]  Physical Therapy Evaluation  Patient Details  Name: Wanda Oneill MRN: [id number] Date of Birth: 1980/10/02 Referring Provider (PT): Dr. Darene Lamer   Encounter Date: 02/17/2019  PT End of Session - 02/17/19 1353    Visit Number  1    Number of Visits  12    Date for PT Re-Evaluation  04/16/19    Authorization Type  [employer]    PT Start Time  1316    PT Stop Time  1417    PT Time Calculation (min)  61 min    Activity Tolerance  Patient tolerated treatment well    Behavior During Therapy  [facility] for tasks assessed/performed       Past Medical History:  Diagnosis Date  . Diabetes mellitus    type II  . Down syndrome   . Family history of adverse reaction to anesthesia    mom has had n/v  . Gastritis   . GERD (gastroesophageal reflux disease)   . Headache   . Hepatic steatosis   . Hidradenitis   . Hypothyroidism   . Irritable bowel syndrome   . Periumbilical hernia   . Pneumonia   . Restless legs   . Tendonitis    chronic in left foot  . Thyroid disease    hypothyroidism    Past Surgical History:  Procedure Laterality Date  . AXILLARY HIDRADENITIS EXCISION Bilateral   . CHOLECYSTECTOMY    . HERNIA REPAIR     multiple  . INCISIONAL HERNIA REPAIR  05/30/2015   LAPROSCOPIC  . INCISIONAL HERNIA REPAIR N/A 05/30/2015   Procedure: LAPAROSCOPIC INCISIONAL HERNIA;  Surgeon: Rolm Bookbinder, MD;  Location: [place] [facility];  Service: General;  Laterality: N/A;  . INSERTION OF MESH N/A 05/30/2015   Procedure: INSERTION OF MESH;  Surgeon: Rolm Bookbinder, MD;  Location: [place];  Service: General;  Laterality: N/A;  . KNEE ARTHROSCOPY     right knee  . LAPAROSCOPY N/A 05/30/2015   Procedure: LAPAROSCOPY DIAGNOSTIC;  Surgeon: Rolm Bookbinder, MD;  Location: [facility];  Service: General;  Laterality: N/A;  . PARTIAL KNEE ARTHROPLASTY Right  10/30/2017   Procedure: UNICOMPARTMENTAL RIGHT KNEE LATERAL;  Surgeon: Paralee Cancel, MD;  [facility];  Service: Orthopedics;  Laterality: Right;  90 mins  . TONSILLECTOMY     adenoids    There were no vitals filed for this visit.   Subjective Assessment - 02/17/19 1322    Subjective  Patient comes in today for right shoulder pain.  She had injection about 2 weeks ago, she reports that it really did not help much.  She is unsure of a specific cause, reports that she thinks she had shoulder pain years ago when she was working.    Pertinent History  Downs,    Limitations  Lifting;House hold activities    Patient Stated Goals  have less pain, reports some difficulty sleeping and getting up from supine, also difficulty lifting and carrying    Currently in Pain?  Yes    Pain Score  7     Pain Location  Shoulder    Pain Orientation  Right;Anterior;Lateral    Pain Descriptors / Indicators  Aching;Sore    Pain Type  Acute pain    Pain Radiating Towards  denies    Pain Onset  More than a month ago    Pain Frequency  Constant    Aggravating Factors   reaching, lifting, carrying things pain can be up to 9-10/10    Pain Relieving Factors  uses a gel, heat that helps at best pain a 4/10    Effect of Pain on Daily Activities  difficulty with some hair and dressing, lifting and carrying         [facility] PT Assessment - 02/17/19 0001      Assessment   Medical Diagnosis  right shoulder    Referring Provider (PT)  Dr. Darene Lamer    Onset Date/Surgical Date  01/17/19    Prior Therapy  last year for LBP      Precautions   Precautions  None      Balance Screen   Has the patient fallen in the past 6 months  No    Has the patient had a decrease in activity level because of a fear of falling?   No    Is the patient reluctant to leave their home because of a fear of falling?   No      Home Environment   Additional Comments  single story, does cooking and cleaning      Prior Function   Level of  Independence  Independent    Vocation  On disability    Leisure  no exercise, some walking      Posture/Postural Control   Posture Comments  fwd head, rounded shoulders      AROM   AROM Assessment Site  Shoulder    Right/Left Shoulder  Right    Right Shoulder Flexion  150 Degrees    Right Shoulder ABduction  115 Degrees    Right Shoulder Internal Rotation  48 Degrees    Right Shoulder External Rotation  62 Degrees      Strength   Overall Strength Comments  pain with abduction and ER    Strength Assessment Site  Shoulder    Right/Left Shoulder  Right    Right Shoulder Flexion  4-/5    Right Shoulder Extension  4/5    Right Shoulder ABduction  3+/5    Right Shoulder Internal Rotation  4/5    Right Shoulder External Rotation  3+/5      Palpation   Palpation comment  she is very tender in the anterior lateral right shoulder, there is a large tender knot here      Special Tests   Other special tests  has + impingement test                Objective measurements completed on examination: See above findings.      [facility] Adult PT Treatment/Exercise - 02/17/19 0001      Modalities   Modalities  Electrical Stimulation;Moist Heat;Ultrasound      Moist Heat Therapy   Number Minutes Moist Heat  15 Minutes    Moist Heat Location  Shoulder;Lumbar Spine      Electrical Stimulation   Electrical Stimulation Location  right shoulder    Electrical Stimulation Action  IFC    Electrical Stimulation Parameters  supine    Electrical Stimulation Goals  Pain      Ultrasound   Ultrasound Location  right anterior lateral shoulder over knot    Ultrasound Parameters  1MHz 1.2w/cm2    Ultrasound Goals  Pain               PT Short Term Goals - 02/17/19 1401      PT SHORT TERM GOAL #1  Title  independent with initial HEP    Time  2    Period  Weeks    Status  New        PT Long Term Goals - 02/17/19 1401      PT LONG TERM GOAL #1   Title  understand posture and  body mechanics    Time  8    Period  Weeks    Status  New      PT LONG TERM GOAL #2   Title  dcrease pain 50%    Time  8    Period  Weeks    Status  New      PT LONG TERM GOAL #3   Title  increase right shoulder ER and IR to 70 degrees for better function    Time  8    Period  Weeks    Status  New      PT LONG TERM GOAL #4   Title  report abvle to lift and carry groceries without pain >5/10    Time  8    Period  Weeks    Status  New      PT LONG TERM GOAL #5   Title  report no difficulty with dressing or doing hair    Time  8    Period  Weeks    Status  New             Plan - 02/17/19 1355    Clinical Impression Statement  Patient comes in today with right shoulder pain, she had x-rays that show some mild arthritis and degeneration, she had an injection without much relief back in December.  She is unsure of  a specific cause but reports most painful with lifting groceries.  She has some difficulty with doing hair and dressing and getting up from supine, she has mild limitation in ROM, pain and weakness with shoulder ER and abduction.  She is very tender with a knot in the anterior lateral right shoulder.  She has very poor posture    Stability/Clinical Decision Making  Stable/Uncomplicated    Clinical Decision Making  Low    Rehab Potential  Good    PT Frequency  2x / week    PT Duration  6 weeks    PT Treatment/Interventions  ADLs/Self Care Home Management;Cryotherapy;Electrical Stimulation;Iontophoresis 4mg /ml Dexamethasone;Moist Heat;Ultrasound;Therapeutic activities;Patient/family education;Therapeutic exercise;Manual techniques    PT Next Visit Plan  slowly start posture and shoulder stability exercises    Consulted and Agree with Plan of Care  Patient       Patient will benefit from skilled therapeutic intervention in order to improve the following deficits and impairments:  Pain, Improper body mechanics, Increased muscle spasms, Postural dysfunction, Impaired  UE functional use, Decreased strength, Decreased range of motion  Visit Diagnosis: Acute pain of right shoulder - Plan: PT plan of care cert/re-cert  Stiffness of right shoulder, not elsewhere classified - Plan: PT plan of care cert/re-cert  Abnormal posture - Plan: PT plan of care cert/re-cert     Problem List Patient Active Problem List   Diagnosis Date Noted  . Acute shoulder bursitis, right 02/03/2019  . Bile salt-induced diarrhea 09/25/2018  . Arthritis of left acromioclavicular joint 09/23/2018  . Precordial chest pain 08/11/2018  . Morbid obesity ([place]) 10/31/2017  . S/P right UKR, lateral 10/30/2017  . Incarcerated epigastric hernia 05/30/2015  . Left foot pain 08/09/2014  . Chronic diarrhea 07/27/2014  . Gout 08/10/2012  . Acid  reflux 01/22/2012  . Down syndrome 11/11/2011  . Hernia of abdominal wall 10/21/2011  . Psoriasis 10/21/2011  . Type 2 diabetes mellitus, uncontrolled ([place]) 04/12/2011  . Hypothyroid 04/12/2011  . Gait abnormality 06/22/2010  . Tibial tendinitis, posterior 06/22/2010    Sumner Boast., PT 02/17/2019, 2:06 PM  [place] [address] [place], [place], [address] Phone: [phone]   Fax:  [phone]  Name: BRIAUNA DARGENIO MRN: [id number] Date of Birth: 12-28-1980

## 2019-02-26 ENCOUNTER — Ambulatory Visit: Payer: Medicare HMO | Attending: Internal Medicine

## 2019-02-26 DIAGNOSIS — Z20822 Contact with and (suspected) exposure to covid-19: Secondary | ICD-10-CM | POA: Diagnosis not present

## 2019-02-27 LAB — NOVEL CORONAVIRUS, NAA: SARS-CoV-2, NAA: NOT DETECTED

## 2019-03-01 ENCOUNTER — Other Ambulatory Visit: Payer: Self-pay

## 2019-03-01 ENCOUNTER — Ambulatory Visit: Payer: Medicare HMO | Admitting: Physical Therapy

## 2019-03-01 ENCOUNTER — Encounter: Payer: Self-pay | Admitting: Physical Therapy

## 2019-03-01 DIAGNOSIS — M545 Low back pain: Secondary | ICD-10-CM | POA: Diagnosis not present

## 2019-03-01 DIAGNOSIS — R293 Abnormal posture: Secondary | ICD-10-CM

## 2019-03-01 DIAGNOSIS — M25611 Stiffness of right shoulder, not elsewhere classified: Secondary | ICD-10-CM | POA: Diagnosis not present

## 2019-03-01 DIAGNOSIS — M25511 Pain in right shoulder: Secondary | ICD-10-CM | POA: Diagnosis not present

## 2019-03-01 DIAGNOSIS — M62830 Muscle spasm of back: Secondary | ICD-10-CM | POA: Diagnosis not present

## 2019-03-01 DIAGNOSIS — R262 Difficulty in walking, not elsewhere classified: Secondary | ICD-10-CM | POA: Diagnosis not present

## 2019-03-01 NOTE — Therapy (Signed)
[place], [place], [address] Phone: [phone]   Fax:  [phone]  Physical Therapy Treatment  Patient Details  Name: Wanda Oneill MRN: [id number] Date of Birth: [birth year] Referring Provider (PT): Dr. Darene Lamer   Encounter Date: 03/01/2019  PT End of Session - 03/01/19 1652    Visit Number  2    Number of Visits  12    Date for PT Re-Evaluation  04/16/19    Authorization Type  [employer]    PT Start Time  1613    PT Stop Time  1703    PT Time Calculation (min)  50 min    Activity Tolerance  Patient tolerated treatment well    Behavior During Therapy  [facility] for tasks assessed/performed       Past Medical History:  Diagnosis Date  . Diabetes mellitus    type II  . Down syndrome   . Family history of adverse reaction to anesthesia    mom has had n/v  . Gastritis   . GERD (gastroesophageal reflux disease)   . Headache   . Hepatic steatosis   . Hidradenitis   . Hypothyroidism   . Irritable bowel syndrome   . Periumbilical hernia   . Pneumonia   . Restless legs   . Tendonitis    chronic in left foot  . Thyroid disease    hypothyroidism    Past Surgical History:  Procedure Laterality Date  . AXILLARY HIDRADENITIS EXCISION Bilateral   . CHOLECYSTECTOMY    . HERNIA REPAIR     multiple  . INCISIONAL HERNIA REPAIR  05/30/2015   LAPROSCOPIC  . INCISIONAL HERNIA REPAIR N/A 05/30/2015   Procedure: LAPAROSCOPIC INCISIONAL HERNIA;  Surgeon: Rolm Bookbinder, MD;  Location: [place];  Service: General;  Laterality: N/A;  . INSERTION OF MESH N/A 05/30/2015   Procedure: INSERTION OF MESH;  Surgeon: Rolm Bookbinder, MD;  Location: [place];  Service: General;  Laterality: N/A;  . KNEE ARTHROSCOPY     right knee  . LAPAROSCOPY N/A 05/30/2015   Procedure: LAPAROSCOPY DIAGNOSTIC;  Surgeon: Rolm Bookbinder, MD;  Location: [place];  Service: General;  Laterality: N/A;  . PARTIAL KNEE ARTHROPLASTY Right  10/30/2017   Procedure: UNICOMPARTMENTAL RIGHT KNEE LATERAL;  Surgeon: Paralee Cancel, MD;  [facility];  Service: Orthopedics;  Laterality: Right;  90 mins  . TONSILLECTOMY     adenoids    There were no vitals filed for this visit.  Subjective Assessment - 03/01/19 1626    Subjective  Patient reports that she has been away due to a covid exposure.  Reports that the arm is still hurting    Currently in Pain?  Yes    Pain Score  6     Pain Location  Shoulder    Pain Orientation  Right;Anterior;Lateral    Aggravating Factors   activity, reaching and lifting                       [facility] Adult PT Treatment/Exercise - 03/01/19 0001      Lumbar Exercises: Aerobic   UBE (Upper Arm Bike)  4 minutes    Nustep  L4 47min      Lumbar Exercises: Standing   Other Standing Lumbar Exercises  triceps 15#, 5# biceps      Lumbar Exercises: Seated   Other Seated Lumbar Exercises  red tband scapular rows and extension, yellow tband ER and IR  Modalities   Modalities  Electrical Stimulation;Moist Heat;Ultrasound      Moist Heat Therapy   Number Minutes Moist Heat  15 Minutes    Moist Heat Location  Shoulder;Lumbar Spine      Electrical Stimulation   Electrical Stimulation Location  right shoulder    Electrical Stimulation Action  IFC    Electrical Stimulation Parameters  supine    Electrical Stimulation Goals  Pain               PT Short Term Goals - 03/01/19 1654      PT SHORT TERM GOAL #1   Title  independent with initial HEP    Status  On-going        PT Long Term Goals - 02/17/19 1401      PT LONG TERM GOAL #1   Title  understand posture and body mechanics    Time  8    Period  Weeks    Status  New      PT LONG TERM GOAL #2   Title  dcrease pain 50%    Time  8    Period  Weeks    Status  New      PT LONG TERM GOAL #3   Title  increase right shoulder ER and IR to 70 degrees for better function    Time  8    Period  Weeks    Status  New       PT LONG TERM GOAL #4   Title  report abvle to lift and carry groceries without pain >5/10    Time  8    Period  Weeks    Status  New      PT LONG TERM GOAL #5   Title  report no difficulty with dressing or doing hair    Time  8    Period  Weeks    Status  New            Plan - 03/01/19 1653    Clinical Impression Statement  Patient has a small knot that is tender on the anterior lateral right shoulder/upper arm area.  She tolerated the initiation of exercises without much pain, seemed to have the most difficulty with the tricpes and the biceps, needs a lot of cues for posture    PT Next Visit Plan  slowly start posture and shoulder stability exercises    Consulted and Agree with Plan of Care  Patient       Patient will benefit from skilled therapeutic intervention in order to improve the following deficits and impairments:  Pain, Improper body mechanics, Increased muscle spasms, Postural dysfunction, Impaired UE functional use, Decreased strength, Decreased range of motion  Visit Diagnosis: Acute pain of right shoulder  Stiffness of right shoulder, not elsewhere classified  Abnormal posture     Problem List Patient Active Problem List   Diagnosis Date Noted  . Acute shoulder bursitis, right 02/03/2019  . Bile salt-induced diarrhea 09/25/2018  . Arthritis of left acromioclavicular joint 09/23/2018  . Precordial chest pain 08/11/2018  . Morbid obesity ([place]) 10/31/2017  . S/P right UKR, lateral 10/30/2017  . Incarcerated epigastric hernia 05/30/2015  . Left foot pain 08/09/2014  . Chronic diarrhea 07/27/2014  . Gout 08/10/2012  . Acid reflux 01/22/2012  . Down syndrome 11/11/2011  . Hernia of abdominal wall 10/21/2011  . Psoriasis 10/21/2011  . Type 2 diabetes mellitus, uncontrolled ([place]) 04/12/2011  . Hypothyroid 04/12/2011  . Gait abnormality  06/22/2010  . Tibial tendinitis, posterior 06/22/2010    Sumner Boast., PT 03/01/2019, 4:55 PM  [address] [place] [address] [place], [place], [address] Phone: [phone]   Fax:  [phone]  Name: Wanda Oneill MRN: [id number] Date of Birth: 02-21-1980

## 2019-03-03 ENCOUNTER — Ambulatory Visit: Payer: Medicare HMO | Admitting: Physical Therapy

## 2019-03-03 ENCOUNTER — Encounter: Payer: Self-pay | Admitting: Physical Therapy

## 2019-03-03 ENCOUNTER — Other Ambulatory Visit: Payer: Self-pay

## 2019-03-03 DIAGNOSIS — M545 Low back pain, unspecified: Secondary | ICD-10-CM

## 2019-03-03 DIAGNOSIS — R293 Abnormal posture: Secondary | ICD-10-CM | POA: Diagnosis not present

## 2019-03-03 DIAGNOSIS — M62830 Muscle spasm of back: Secondary | ICD-10-CM

## 2019-03-03 DIAGNOSIS — R262 Difficulty in walking, not elsewhere classified: Secondary | ICD-10-CM

## 2019-03-03 DIAGNOSIS — M25511 Pain in right shoulder: Secondary | ICD-10-CM

## 2019-03-03 DIAGNOSIS — M25611 Stiffness of right shoulder, not elsewhere classified: Secondary | ICD-10-CM

## 2019-03-03 NOTE — Therapy (Signed)
[place], [place], [address] Phone: [phone]   Fax:  [phone]  Physical Therapy Treatment  Patient Details  Name: Wanda Oneill MRN: [id number] Date of Birth: [birth year] Referring Provider (PT): Dr. Darene Lamer   Encounter Date: 03/03/2019  PT End of Session - 03/03/19 1655    Visit Number  3    Date for PT Re-Evaluation  04/16/19    Authorization Type  [employer]    PT Start Time  7858    PT Stop Time  1703    PT Time Calculation (min)  49 min    Activity Tolerance  Patient limited by pain    Behavior During Therapy  [facility] for tasks assessed/performed       Past Medical History:  Diagnosis Date  . Diabetes mellitus    type II  . Down syndrome   . Family history of adverse reaction to anesthesia    mom has had n/v  . Gastritis   . GERD (gastroesophageal reflux disease)   . Headache   . Hepatic steatosis   . Hidradenitis   . Hypothyroidism   . Irritable bowel syndrome   . Periumbilical hernia   . Pneumonia   . Restless legs   . Tendonitis    chronic in left foot  . Thyroid disease    hypothyroidism    Past Surgical History:  Procedure Laterality Date  . AXILLARY HIDRADENITIS EXCISION Bilateral   . CHOLECYSTECTOMY    . HERNIA REPAIR     multiple  . INCISIONAL HERNIA REPAIR  05/30/2015   LAPROSCOPIC  . INCISIONAL HERNIA REPAIR N/A 05/30/2015   Procedure: LAPAROSCOPIC INCISIONAL HERNIA;  Surgeon: Rolm Bookbinder, MD;  Location: [place];  Service: General;  Laterality: N/A;  . INSERTION OF MESH N/A 05/30/2015   Procedure: INSERTION OF MESH;  Surgeon: Rolm Bookbinder, MD;  Location: [place];  Service: General;  Laterality: N/A;  . KNEE ARTHROSCOPY     right knee  . LAPAROSCOPY N/A 05/30/2015   Procedure: LAPAROSCOPY DIAGNOSTIC;  Surgeon: Rolm Bookbinder, MD;  Location: [place];  Service: General;  Laterality: N/A;  . PARTIAL KNEE ARTHROPLASTY Right 10/30/2017   Procedure:  UNICOMPARTMENTAL RIGHT KNEE LATERAL;  Surgeon: Paralee Cancel, MD;  [facility];  Service: Orthopedics;  Laterality: Right;  90 mins  . TONSILLECTOMY     adenoids    There were no vitals filed for this visit.  Subjective Assessment - 03/03/19 1623    Subjective  Patient reports that she fell coming up the ramp into our building, she reports that her mom helped her up.  She has a pretty good abrasion on the right knee, I cleaned it up and put a bandage on it.    Currently in Pain?  Yes    Pain Score  4     Pain Location  Shoulder    Pain Orientation  Right;Anterior;Lateral                       [facility] Adult PT Treatment/Exercise - 03/03/19 0001      Shoulder Exercises: Seated   Extension  Both;20 reps;Theraband    Theraband Level (Shoulder Extension)  Level 2 (Red)    Row  Both;20 reps;Theraband    Theraband Level (Shoulder Row)  Level 2 (Red)    External Rotation  Both;20 reps;Theraband    Theraband Level (Shoulder External Rotation)  Level 2 (Red)    Internal  Rotation  Right;20 reps;Theraband    Theraband Level (Shoulder Internal Rotation)  Level 2 (Red)      Shoulder Exercises: ROM/Strengthening   UBE (Upper Arm Bike)  level 3 x 6 minutes      Moist Heat Therapy   Number Minutes Moist Heat  15 Minutes    Moist Heat Location  Shoulder;Lumbar Spine      Electrical Stimulation   Electrical Stimulation Location  right shoulder    Electrical Stimulation Action  IFC    Electrical Stimulation Parameters  supine    Electrical Stimulation Goals  Pain      Manual Therapy   Manual Therapy  Soft tissue mobilization    Soft tissue mobilization  to the knot in the right upper arm               PT Short Term Goals - 03/03/19 1659      PT SHORT TERM GOAL #1   Title  independent with initial HEP    Status  Partially Met        PT Long Term Goals - 03/03/19 1659      PT LONG TERM GOAL #1   Title  understand posture and body mechanics    Status   On-going      PT LONG TERM GOAL #2   Title  dcrease pain 50%    Status  On-going            Plan - 03/03/19 1655    Clinical Impression Statement  Patient fell prior to coming in the building, reports tha tshe is fine and not hurting too much, she has an abrasion on her right knee that I bandaged, I did all exercises in sitting and backed off some things,  Knee had good ROM and she was walking fine, she said she tripped on the curb.  Still with knot in the right upper arm    PT Next Visit Plan  slowly start posture and shoulder stability exercises    Consulted and Agree with Plan of Care  Patient       Patient will benefit from skilled therapeutic intervention in order to improve the following deficits and impairments:  Pain, Improper body mechanics, Increased muscle spasms, Postural dysfunction, Impaired UE functional use, Decreased strength, Decreased range of motion  Visit Diagnosis: Acute pain of right shoulder  Stiffness of right shoulder, not elsewhere classified  Abnormal posture  Muscle spasm of back  Acute bilateral low back pain without sciatica  Difficulty in walking, not elsewhere classified     Problem List Patient Active Problem List   Diagnosis Date Noted  . Acute shoulder bursitis, right 02/03/2019  . Bile salt-induced diarrhea 09/25/2018  . Arthritis of left acromioclavicular joint 09/23/2018  . Precordial chest pain 08/11/2018  . Morbid obesity ([place]) 10/31/2017  . S/P right UKR, lateral 10/30/2017  . Incarcerated epigastric hernia 05/30/2015  . Left foot pain 08/09/2014  . Chronic diarrhea 07/27/2014  . Gout 08/10/2012  . Acid reflux 01/22/2012  . Down syndrome 11/11/2011  . Hernia of abdominal wall 10/21/2011  . Psoriasis 10/21/2011  . Type 2 diabetes mellitus, uncontrolled ([place]) 04/12/2011  . Hypothyroid 04/12/2011  . Gait abnormality 06/22/2010  . Tibial tendinitis, posterior 06/22/2010    Sumner Boast., PT 03/03/2019, 5:01  PM  [place] [address] [place], [place], [address] Phone: [phone]   Fax:  [phone]  Name: Wanda Oneill MRN: [id number] Date  of Birth: February 03, 1981

## 2019-03-05 ENCOUNTER — Ambulatory Visit: Payer: Medicare Other | Admitting: Sports Medicine

## 2019-03-08 ENCOUNTER — Ambulatory Visit: Payer: Medicare HMO | Attending: Sports Medicine | Admitting: Physical Therapy

## 2019-03-08 ENCOUNTER — Encounter: Payer: Self-pay | Admitting: Physical Therapy

## 2019-03-08 ENCOUNTER — Other Ambulatory Visit: Payer: Self-pay

## 2019-03-08 DIAGNOSIS — R293 Abnormal posture: Secondary | ICD-10-CM

## 2019-03-08 DIAGNOSIS — M25511 Pain in right shoulder: Secondary | ICD-10-CM | POA: Insufficient documentation

## 2019-03-08 DIAGNOSIS — M25611 Stiffness of right shoulder, not elsewhere classified: Secondary | ICD-10-CM | POA: Diagnosis present

## 2019-03-08 NOTE — Therapy (Signed)
[place], [place], [address] Phone: [phone]   Fax:  [phone]  Physical Therapy Treatment  Patient Details  Name: Wanda Oneill MRN: [id number] Date of Birth: [birth year] Referring Provider (PT): Dr. Darene Lamer   Encounter Date: 03/08/2019  PT End of Session - 03/08/19 1650    Visit Number  4    Date for PT Re-Evaluation  04/16/19    Authorization Type  [employer]    PT Start Time  1615    PT Stop Time  1703    PT Time Calculation (min)  48 min    Activity Tolerance  Patient limited by pain    Behavior During Therapy  [facility] for tasks assessed/performed       Past Medical History:  Diagnosis Date  . Diabetes mellitus    type II  . Down syndrome   . Family history of adverse reaction to anesthesia    mom has had n/v  . Gastritis   . GERD (gastroesophageal reflux disease)   . Headache   . Hepatic steatosis   . Hidradenitis   . Hypothyroidism   . Irritable bowel syndrome   . Periumbilical hernia   . Pneumonia   . Restless legs   . Tendonitis    chronic in left foot  . Thyroid disease    hypothyroidism    Past Surgical History:  Procedure Laterality Date  . AXILLARY HIDRADENITIS EXCISION Bilateral   . CHOLECYSTECTOMY    . HERNIA REPAIR     multiple  . INCISIONAL HERNIA REPAIR  05/30/2015   LAPROSCOPIC  . INCISIONAL HERNIA REPAIR N/A 05/30/2015   Procedure: LAPAROSCOPIC INCISIONAL HERNIA;  Surgeon: Rolm Bookbinder, MD;  Location: [place];  Service: General;  Laterality: N/A;  . INSERTION OF MESH N/A 05/30/2015   Procedure: INSERTION OF MESH;  Surgeon: Rolm Bookbinder, MD;  Location: [place];  Service: General;  Laterality: N/A;  . KNEE ARTHROSCOPY     right knee  . LAPAROSCOPY N/A 05/30/2015   Procedure: LAPAROSCOPY DIAGNOSTIC;  Surgeon: Rolm Bookbinder, MD;  Location: [place];  Service: General;  Laterality: N/A;  . PARTIAL KNEE ARTHROPLASTY Right 10/30/2017   Procedure:  UNICOMPARTMENTAL RIGHT KNEE LATERAL;  Surgeon: Paralee Cancel, MD;  [facility];  Service: Orthopedics;  Laterality: Right;  90 mins  . TONSILLECTOMY     adenoids    There were no vitals filed for this visit.  Subjective Assessment - 03/08/19 1618    Subjective  Patient reports that she has been very sore since she fell last week, reports soreness in the shoulder and arm    Currently in Pain?  Yes    Pain Score  5     Pain Location  Shoulder   pain into the neck   Pain Orientation  Right;Anterior                       [facility] Adult PT Treatment/Exercise - 03/08/19 0001      Shoulder Exercises: Seated   Extension  Both;20 reps;Theraband    Theraband Level (Shoulder Extension)  Level 2 (Red)    Row  Both;20 reps;Theraband    Theraband Level (Shoulder Row)  Level 2 (Red)    External Rotation  Both;20 reps;Theraband    Theraband Level (Shoulder External Rotation)  Level 2 (Red)    Internal Rotation  Right;20 reps;Theraband    Theraband Level (Shoulder Internal Rotation)  Level 2 (Red)  Other Seated Exercises  2# bent over row, extension and no weight horizontal abduction      Shoulder Exercises: ROM/Strengthening   UBE (Upper Arm Bike)  level 3 x 6 minutes    Wall Wash  flexion, circles, scaption    "W" Arms  12      Shoulder Exercises: Isometric Strengthening   Extension  5X5"    External Rotation  5X5"    Internal Rotation  5X5"      Moist Heat Therapy   Number Minutes Moist Heat  15 Minutes    Moist Heat Location  Shoulder;Lumbar Spine      Electrical Stimulation   Electrical Stimulation Location  right shoulder    Electrical Stimulation Action  IFC    Electrical Stimulation Parameters  supine    Electrical Stimulation Goals  Pain      Manual Therapy   Manual Therapy  Soft tissue mobilization;Passive ROM    Soft tissue mobilization  to the knot in the right upper arm    Passive ROM  all GH joint mobs to her tolerance               PT  Short Term Goals - 03/03/19 1659      PT SHORT TERM GOAL #1   Title  independent with initial HEP    Status  Partially Met        PT Long Term Goals - 03/03/19 1659      PT LONG TERM GOAL #1   Title  understand posture and body mechanics    Status  On-going      PT LONG TERM GOAL #2   Title  dcrease pain 50%    Status  On-going            Plan - 03/08/19 1651    Clinical Impression Statement  Patient reports that she has been very sore since she had the fall last week.  She is moving okay but not great.  She needs cues to engage the scapula mms and for posture, her knee is bothering her some and most exercises were performed in sitting    PT Next Visit Plan  slowly start posture and shoulder stability exercises    Consulted and Agree with Plan of Care  Patient       Patient will benefit from skilled therapeutic intervention in order to improve the following deficits and impairments:  Pain, Improper body mechanics, Increased muscle spasms, Postural dysfunction, Impaired UE functional use, Decreased strength, Decreased range of motion  Visit Diagnosis: Acute pain of right shoulder  Stiffness of right shoulder, not elsewhere classified  Abnormal posture     Problem List Patient Active Problem List   Diagnosis Date Noted  . Acute shoulder bursitis, right 02/03/2019  . Bile salt-induced diarrhea 09/25/2018  . Arthritis of left acromioclavicular joint 09/23/2018  . Precordial chest pain 08/11/2018  . Morbid obesity ([place]) 10/31/2017  . S/P right UKR, lateral 10/30/2017  . Incarcerated epigastric hernia 05/30/2015  . Left foot pain 08/09/2014  . Chronic diarrhea 07/27/2014  . Gout 08/10/2012  . Acid reflux 01/22/2012  . Down syndrome 11/11/2011  . Hernia of abdominal wall 10/21/2011  . Psoriasis 10/21/2011  . Type 2 diabetes mellitus, uncontrolled ([place]) 04/12/2011  . Hypothyroid 04/12/2011  . Gait abnormality 06/22/2010  . Tibial tendinitis, posterior  06/22/2010    Sumner Boast., PT 03/08/2019, 4:56 PM  [place] [phone]  [place], [place], [address] Phone: [phone]   Fax:  [phone]  Name: Wanda Oneill MRN: [id number] Date of Birth: 02-10-1980

## 2019-03-09 ENCOUNTER — Ambulatory Visit (INDEPENDENT_AMBULATORY_CARE_PROVIDER_SITE_OTHER): Payer: Medicare HMO

## 2019-03-09 ENCOUNTER — Ambulatory Visit (INDEPENDENT_AMBULATORY_CARE_PROVIDER_SITE_OTHER): Payer: Medicare HMO | Admitting: Sports Medicine

## 2019-03-09 ENCOUNTER — Other Ambulatory Visit: Payer: Self-pay

## 2019-03-09 DIAGNOSIS — G8929 Other chronic pain: Secondary | ICD-10-CM

## 2019-03-09 DIAGNOSIS — M7551 Bursitis of right shoulder: Secondary | ICD-10-CM

## 2019-03-09 DIAGNOSIS — M797 Fibromyalgia: Secondary | ICD-10-CM | POA: Insufficient documentation

## 2019-03-09 DIAGNOSIS — M542 Cervicalgia: Secondary | ICD-10-CM | POA: Diagnosis not present

## 2019-03-09 MED ORDER — DULOXETINE HCL 30 MG PO CPEP: 30.0000 mg | ORAL_CAPSULE | Freq: Every day | ORAL | 3 refills | Status: DC

## 2019-03-09 NOTE — Progress Notes (Addendum)
    Procedures performed today:    None.  Independent interpretation of tests performed by another provider:   None.  Impression and Recommendations:    Acute shoulder bursitis, right Wanda Oneill returns, she is a delightful 39 year old female with Down syndrome. She has had right shoulder impingement type symptoms for some time now. She has been through formal physical therapy, at the last visit we did an ultrasound-guided subacromial injection, she has had good improvement in her pain but still has some discomfort over the deltoid and worse with overhead activities.  It makes it difficult for her to lift objects overhead. Not waking her from sleep. Because of failure of greater than 6 weeks of physician directed conservative measures including therapy and injections we are going to proceed with an MRI, I would also like her to see Dr. Theda Sers for discussion of shoulder arthroscopy and subacromial decompression.  Fibromyalgia Wanda Oneill has multiple myofascial trigger points, pain out of proportion to degree of palpation. She does have some lumbar degenerative changes, this has been evaluated by Dr. Arnoldo Morale with neurosurgery and nothing is operative. Pain is diffuse over the neck, shoulders, flank, hip girdle. Adding low-dose Cymbalta. Follow-up in 6 weeks to reevaluate and adjust dose if needed.  Neck pain, chronic Wanda Oneill is also having some chronic right upper neck pain with radiation up into the shoulder, I think her rotator cuff disease is a separate process but we are also going to evaluate her neck, x-rays today, we will add an MRI for this weekend. Her motion is good but she does have a positive Spurling sign, good reflexes. She has failed greater than 6 weeks of conservative measures including formal physical therapy.    ___________________________________________ Gwen Her. Dianah Field, M.D., ABFM., CAQSM. Primary Care and [place]  Instructor of [employer] [place] [employer] [facility] [employer]

## 2019-03-09 NOTE — Patient Instructions (Signed)
Myofascial Pain Syndrome and Fibromyalgia Myofascial pain syndrome and fibromyalgia are both pain disorders. This pain may be felt mainly in your muscles.  Myofascial pain syndrome: ? Always has tender points in the muscle that will cause pain when pressed (trigger points). The pain may come and go. ? Usually affects your neck, upper back, and shoulder areas. The pain often radiates into your arms and hands.  Fibromyalgia: ? Has muscle pains and tenderness that come and go. ? Is often associated with fatigue and sleep problems. ? Has trigger points. ? Tends to be long-lasting (chronic), but is not life-threatening. Fibromyalgia and myofascial pain syndrome are not the same. However, they often occur together. If you have both conditions, each can make the other worse. Both are common and can cause enough pain and fatigue to make day-to-day activities difficult. Both can be hard to diagnose because their symptoms are common in many other conditions. What are the causes? The exact causes of these conditions are not known. What increases the risk? You are more likely to develop this condition if:  You have a family history of the condition.  You have certain triggers, such as: ? Spine disorders. ? An injury (trauma) or other physical stressors. ? Being under a lot of stress. ? Medical conditions such as osteoarthritis, rheumatoid arthritis, or lupus. What are the signs or symptoms? Fibromyalgia The main symptom of fibromyalgia is widespread pain and tenderness in your muscles. Pain is sometimes described as stabbing, shooting, or burning. You may also have:  Tingling or numbness.  Sleep problems and fatigue.  Problems with attention and concentration (fibro fog). Other symptoms may include:  Bowel and bladder problems.  Headaches.  Visual problems.  Problems with odors and noises.  Depression or mood changes.  Painful menstrual periods (dysmenorrhea).  Dry skin or  eyes. These symptoms can vary over time. Myofascial pain syndrome Symptoms of myofascial pain syndrome include:  Tight, ropy bands of muscle.  Uncomfortable sensations in muscle areas. These may include aching, cramping, burning, numbness, tingling, and weakness.  Difficulty moving certain parts of the body freely (poor range of motion). How is this diagnosed? This condition may be diagnosed by your symptoms and medical history. You will also have a physical exam. In general:  Fibromyalgia is diagnosed if you have pain, fatigue, and other symptoms for more than 3 months, and symptoms cannot be explained by another condition.  Myofascial pain syndrome is diagnosed if you have trigger points in your muscles, and those trigger points are tender and cause pain elsewhere in your body (referred pain). How is this treated? Treatment for these conditions depends on the type that you have.  For fibromyalgia: ? Pain medicines, such as NSAIDs. ? Medicines for treating depression. ? Medicines for treating seizures. ? Medicines that relax the muscles.  For myofascial pain: ? Pain medicines, such as NSAIDs. ? Cooling and stretching of muscles. ? Trigger point injections. ? Sound wave (ultrasound) treatments to stimulate muscles. Treating these conditions often requires a team of health care providers. These may include:  Your primary care provider.  Physical therapist.  Complementary health care providers, such as massage therapists or acupuncturists.  Psychiatrist for cognitive behavioral therapy. Follow these instructions at home: Medicines  Take over-the-counter and prescription medicines only as told by your health care provider.  Do not drive or use heavy machinery while taking prescription pain medicine.  If you are taking prescription pain medicine, take actions to prevent or treat constipation. Your health care   provider may recommend that you: ? Drink enough fluid to keep  your urine pale yellow. ? Eat foods that are high in fiber, such as fresh fruits and vegetables, whole grains, and beans. ? Limit foods that are high in fat and processed sugars, such as fried or sweet foods. ? Take an over-the-counter or prescription medicine for constipation. Lifestyle   Exercise as directed by your health care provider or physical therapist.  Practice relaxation techniques to control your stress. You may want to try: ? Biofeedback. ? Visual imagery. ? Hypnosis. ? Muscle relaxation. ? Yoga. ? Meditation.  Maintain a healthy lifestyle. This includes eating a healthy diet and getting enough sleep.  Do not use any products that contain nicotine or tobacco, such as cigarettes and e-cigarettes. If you need help quitting, ask your health care provider. General instructions  Talk to your health care provider about complementary treatments, such as acupuncture or massage.  Consider joining a support group with others who are diagnosed with this condition.  Do not do activities that stress or strain your muscles. This includes repetitive motions and heavy lifting.  Keep all follow-up visits as told by your health care provider. This is important. Where to find more information  National Fibromyalgia Association: www.fmaware.org  Arthritis Foundation: www.arthritis.org  American Chronic Pain Association: www.theacpa.org Contact a health care provider if:  You have new symptoms.  Your symptoms get worse or your pain is severe.  You have side effects from your medicines.  You have trouble sleeping.  Your condition is causing depression or anxiety. Summary  Myofascial pain syndrome and fibromyalgia are pain disorders.  Myofascial pain syndrome has tender points in the muscle that will cause pain when pressed (trigger points). Fibromyalgia also has muscle pains and tenderness that come and go, but this condition is often associated with fatigue and sleep  disturbances.  Fibromyalgia and myofascial pain syndrome are not the same but often occur together, causing pain and fatigue that make day-to-day activities difficult.  Treatment for fibromyalgia includes taking medicines to relax the muscles and medicines for pain, depression, or seizures. Treatment for myofascial pain syndrome includes taking medicines for pain, cooling and stretching of muscles, and injecting medicines into trigger points.  Follow your health care provider's instructions for taking medicines and maintaining a healthy lifestyle. This information is not intended to replace advice given to you by your health care provider. Make sure you discuss any questions you have with your health care provider. Document Revised: 05/15/2018 Document Reviewed: 02/05/2017 Elsevier Patient Education  2020 Elsevier Inc.  

## 2019-03-09 NOTE — Addendum Note (Signed)
Addended by: Silverio Decamp on: 03/09/2019 02:27 PM   Modules accepted: Orders

## 2019-03-09 NOTE — Assessment & Plan Note (Signed)
Wanda Oneill is also having some chronic right upper neck pain with radiation up into the shoulder, I think her rotator cuff disease is a separate process but we are also going to evaluate her neck, x-rays today, we will add an MRI for this weekend. Her motion is good but she does have a positive Spurling sign, good reflexes. She has failed greater than 6 weeks of conservative measures including formal physical therapy.

## 2019-03-09 NOTE — Assessment & Plan Note (Signed)
Wanda Oneill has multiple myofascial trigger points, pain out of proportion to degree of palpation. She does have some lumbar degenerative changes, this has been evaluated by Dr. Arnoldo Morale with neurosurgery and nothing is operative. Pain is diffuse over the neck, shoulders, flank, hip girdle. Adding low-dose Cymbalta. Follow-up in 6 weeks to reevaluate and adjust dose if needed.

## 2019-03-09 NOTE — Assessment & Plan Note (Signed)
Dachelle returns, she is a delightful 39 year old female with Down syndrome. She has had right shoulder impingement type symptoms for some time now. She has been through formal physical therapy, at the last visit we did an ultrasound-guided subacromial injection, she has had good improvement in her pain but still has some discomfort over the deltoid and worse with overhead activities.  It makes it difficult for her to lift objects overhead. Not waking her from sleep. Because of failure of greater than 6 weeks of physician directed conservative measures including therapy and injections we are going to proceed with an MRI, I would also like her to see Dr. Theda Sers for discussion of shoulder arthroscopy and subacromial decompression.

## 2019-03-11 ENCOUNTER — Other Ambulatory Visit: Payer: Self-pay

## 2019-03-11 ENCOUNTER — Ambulatory Visit: Payer: Medicare HMO | Admitting: Physical Therapy

## 2019-03-11 ENCOUNTER — Encounter: Payer: Self-pay | Admitting: Physical Therapy

## 2019-03-11 DIAGNOSIS — M25511 Pain in right shoulder: Secondary | ICD-10-CM | POA: Diagnosis not present

## 2019-03-11 DIAGNOSIS — R293 Abnormal posture: Secondary | ICD-10-CM

## 2019-03-11 DIAGNOSIS — M25611 Stiffness of right shoulder, not elsewhere classified: Secondary | ICD-10-CM

## 2019-03-11 NOTE — Therapy (Addendum)
[facility] [place], [place], [address] Phone: [phone]   Fax:  [phone]  Physical Therapy Treatment  Patient Details  Name: Wanda Oneill MRN: [id number] Date of Birth: 09-15-1980 Referring Provider (PT): Dr. Darene Lamer   Encounter Date: 03/11/2019  PT End of Session - 03/11/19 1646    Visit Number  5    Date for PT Re-Evaluation  04/16/19    Authorization Type  [employer]    PT Start Time  1611    PT Stop Time  1709    PT Time Calculation (min)  58 min    Activity Tolerance  Patient limited by pain    Behavior During Therapy  [facility] for tasks assessed/performed       Past Medical History:  Diagnosis Date  . Diabetes mellitus    type II  . Down syndrome   . Family history of adverse reaction to anesthesia    mom has had n/v  . Gastritis   . GERD (gastroesophageal reflux disease)   . Headache   . Hepatic steatosis   . Hidradenitis   . Hypothyroidism   . Irritable bowel syndrome   . Periumbilical hernia   . Pneumonia   . Restless legs   . Tendonitis    chronic in left foot  . Thyroid disease    hypothyroidism    Past Surgical History:  Procedure Laterality Date  . AXILLARY HIDRADENITIS EXCISION Bilateral   . CHOLECYSTECTOMY    . HERNIA REPAIR     multiple  . INCISIONAL HERNIA REPAIR  05/30/2015   LAPROSCOPIC  . INCISIONAL HERNIA REPAIR N/A 05/30/2015   Procedure: LAPAROSCOPIC INCISIONAL HERNIA;  Surgeon: Rolm Bookbinder, MD;  Location: [facility];  Service: General;  Laterality: N/A;  . INSERTION OF MESH N/A 05/30/2015   Procedure: INSERTION OF MESH;  Surgeon: Rolm Bookbinder, MD;  Location: [facility];  Service: General;  Laterality: N/A;  . KNEE ARTHROSCOPY     right knee  . LAPAROSCOPY N/A 05/30/2015   Procedure: LAPAROSCOPY DIAGNOSTIC;  Surgeon: Rolm Bookbinder, MD;  Location: [facility];  Service: General;  Laterality: N/A;  . PARTIAL KNEE ARTHROPLASTY Right 10/30/2017   Procedure:  UNICOMPARTMENTAL RIGHT KNEE LATERAL;  Surgeon: Paralee Cancel, MD;  Location: [facility];  Service: Orthopedics;  Laterality: Right;  90 mins  . TONSILLECTOMY     adenoids    There were no vitals filed for this visit.  Subjective Assessment - 03/11/19 1612    Subjective  Patient reports that she is still very sore    Currently in Pain?  Yes    Pain Score  6     Pain Location  Shoulder    Pain Orientation  Right    Aggravating Factors   reaching out                       [facility] Adult PT Treatment/Exercise - 03/11/19 0001      Shoulder Exercises: Standing   External Rotation  Both;20 reps;Theraband    Theraband Level (Shoulder External Rotation)  Level 2 (Red)    Extension  5 reps;20 reps;Theraband    Theraband Level (Shoulder Extension)  Level 2 (Red)    Other Standing Exercises  back to wall overhead reach with small ball      Shoulder Exercises: ROM/Strengthening   UBE (Upper Arm Bike)  level 4 x 6 minutes    Nustep  level 5 x 5 minutes  Lat Pull  1.5 plate;20 reps    Cybex Press  1 plate;20 reps    Cybex Row  1.5 plate;20 reps    Wall Wash  flexion, circles, scaption    "W" Arms  12      Shoulder Exercises: Isometric Strengthening   Extension  5X5"    External Rotation  5X5"    Internal Rotation  5X5"      Moist Heat Therapy   Number Minutes Moist Heat  12 Minutes    Moist Heat Location  Shoulder;Lumbar Spine      Electrical Stimulation   Electrical Stimulation Location  right shoulder    Electrical Stimulation Action  IFC    Electrical Stimulation Parameters  supine    Electrical Stimulation Goals  Pain               PT Short Term Goals - 03/03/19 1659      PT SHORT TERM GOAL #1   Title  independent with initial HEP    Status  Partially Met        PT Long Term Goals - 03/11/19 1726      PT LONG TERM GOAL #1   Title  understand posture and body mechanics    Status  Partially Met      PT LONG TERM GOAL #2   Title  dcrease pain  50%    Status  Partially Met            Plan - 03/11/19 1653    Clinical Impression Statement  Patient moving better, still c/o pain.  She needs a lot of cues for posture especially scapular and cervical retraction.    PT Next Visit Plan  will continue to work on strength    Consulted and Agree with Plan of Care  Patient       Patient will benefit from skilled therapeutic intervention in order to improve the following deficits and impairments:  Pain, Improper body mechanics, Increased muscle spasms, Postural dysfunction, Impaired UE functional use, Decreased strength, Decreased range of motion  Visit Diagnosis: Acute pain of right shoulder  Stiffness of right shoulder, not elsewhere classified  Abnormal posture     Problem List Patient Active Problem List   Diagnosis Date Noted  . Fibromyalgia 03/09/2019  . Neck pain, chronic 03/09/2019  . Acute shoulder bursitis, right 02/03/2019  . Bile salt-induced diarrhea 09/25/2018  . Arthritis of left acromioclavicular joint 09/23/2018  . Precordial chest pain 08/11/2018  . Morbid obesity ([place]) 10/31/2017  . S/P right UKR, lateral 10/30/2017  . Incarcerated epigastric hernia 05/30/2015  . Left foot pain 08/09/2014  . Chronic diarrhea 07/27/2014  . Gout 08/10/2012  . Acid reflux 01/22/2012  . Down syndrome 11/11/2011  . Hernia of abdominal wall 10/21/2011  . Psoriasis 10/21/2011  . Type 2 diabetes mellitus, uncontrolled ([place]) 04/12/2011  . Hypothyroid 04/12/2011  . Gait abnormality 06/22/2010  . Tibial tendinitis, posterior 06/22/2010  D/C goals met  Sumner Boast., PT 03/11/2019, 5:27 PM  [facility], [place], [address] Phone: [phone]   Fax:  [phone]  Name: NAJA APPERSON MRN: [id number] Date of Birth: 1980-05-08

## 2019-03-14 ENCOUNTER — Other Ambulatory Visit: Payer: Self-pay

## 2019-03-14 ENCOUNTER — Ambulatory Visit (INDEPENDENT_AMBULATORY_CARE_PROVIDER_SITE_OTHER): Payer: Medicare HMO

## 2019-03-14 DIAGNOSIS — M542 Cervicalgia: Secondary | ICD-10-CM | POA: Diagnosis not present

## 2019-03-14 DIAGNOSIS — G8929 Other chronic pain: Secondary | ICD-10-CM | POA: Diagnosis not present

## 2019-03-14 DIAGNOSIS — M7551 Bursitis of right shoulder: Secondary | ICD-10-CM

## 2019-03-14 DIAGNOSIS — M25511 Pain in right shoulder: Secondary | ICD-10-CM | POA: Diagnosis not present

## 2019-03-15 ENCOUNTER — Ambulatory Visit: Payer: Medicare HMO | Admitting: Physical Therapy

## 2019-03-17 ENCOUNTER — Ambulatory Visit: Payer: Medicare HMO | Admitting: Physical Therapy

## 2019-04-01 ENCOUNTER — Ambulatory Visit: Payer: Medicare HMO | Attending: Family

## 2019-04-01 DIAGNOSIS — Z23 Encounter for immunization: Secondary | ICD-10-CM

## 2019-04-01 NOTE — Progress Notes (Signed)
   Covid-19 Vaccination Clinic  Name:  Wanda Oneill    MRN: [id number] DOB: November 11, 1980  04/01/2019  [name] was observed post Covid-19 immunization for 15 minutes without incidence. She was provided with Vaccine Information Sheet and instruction to access the V-Safe system.   [name] was instructed to call 911 with any severe reactions post vaccine: Marland Kitchen Difficulty breathing  . Swelling of your face and throat  . A fast heartbeat  . A bad rash all over your body  . Dizziness and weakness    Immunizations Administered    Name Date Dose VIS Date Route   Moderna COVID-19 Vaccine 04/01/2019  2:26 PM 0.5 mL 01/05/2019 Intramuscular   Manufacturer: Moderna   LotWU:[id number]   [id number]

## 2019-04-05 DIAGNOSIS — M25511 Pain in right shoulder: Secondary | ICD-10-CM | POA: Diagnosis not present

## 2019-04-05 DIAGNOSIS — M19011 Primary osteoarthritis, right shoulder: Secondary | ICD-10-CM | POA: Diagnosis not present

## 2019-04-21 ENCOUNTER — Ambulatory Visit (INDEPENDENT_AMBULATORY_CARE_PROVIDER_SITE_OTHER): Payer: Medicare HMO | Admitting: Family Medicine

## 2019-04-21 ENCOUNTER — Encounter: Payer: Self-pay | Admitting: Family Medicine

## 2019-04-21 ENCOUNTER — Other Ambulatory Visit: Payer: Self-pay

## 2019-04-21 VITALS — BP 122/81 | HR 95 | Temp 96.4°F | Resp 16 | Ht 64.0 in | Wt 248.0 lb

## 2019-04-21 DIAGNOSIS — B372 Candidiasis of skin and nail: Secondary | ICD-10-CM

## 2019-04-21 MED ORDER — NYSTATIN 100000 UNIT/GM EX POWD: 1.0000 "application " | Freq: Three times a day (TID) | CUTANEOUS | 0 refills | Status: DC

## 2019-04-21 MED ORDER — FLUCONAZOLE 50 MG PO TABS: 50.0000 mg | ORAL_TABLET | Freq: Every day | ORAL | 0 refills | Status: DC

## 2019-04-21 NOTE — Progress Notes (Signed)
[facility] [address], [place], [place] [address] [phone]  Date:  04/21/2019   Name:  Wanda Oneill   DOB:  1980-04-05   MRN:  [id number]  PCP:  Darreld Mclean, MD    Chief Complaint: Vaginitis (Groin, under breast, itching)   History of Present Illness:  Wanda Oneill is a 39 y.o. very pleasant female patient who presents with the following:  Young woman with history of diabetes, hypothyroidism, obesity, Down syndrome and several associated MSK problems.  She recently received a shot of steroid in her right shoulder Here today with concern of yeast infection-Candida intertrigo  Last seen by myself in September for a follow-up visit Her endocrinologist is Dr. Chalmers Cater who manages her diabetes and hypothyroidism  A1c per Dr. Dyke Maes recently went on a trip to [place] with her family.  While there she began to develop signs of Candida intertrigo under her breasts and in her groin and belly area.  They have been using an over-the-counter antifungal cream, we think Chlortrimazole.  She also has tried 2 doses of Diflucan 150 given about 4 days apart  Unfortunately so far these measures have really not been helpful She feels fine except her skin is itchy and irritated  She also notes some earwax which can be bothersome  Patient Active Problem List   Diagnosis Date Noted  . Fibromyalgia 03/09/2019  . Neck pain, chronic 03/09/2019  . Acute shoulder bursitis, right 02/03/2019  . Bile salt-induced diarrhea 09/25/2018  . Arthritis of left acromioclavicular joint 09/23/2018  . Precordial chest pain 08/11/2018  . Morbid obesity ([place]) 10/31/2017  . S/P right UKR, lateral 10/30/2017  . Incarcerated epigastric hernia 05/30/2015  . Left foot pain 08/09/2014  . Chronic diarrhea 07/27/2014  . Gout 08/10/2012  . Acid reflux 01/22/2012  . Down syndrome 11/11/2011  . Hernia of abdominal wall 10/21/2011  .  Psoriasis 10/21/2011  . Type 2 diabetes mellitus, uncontrolled ([place]) 04/12/2011  . Hypothyroid 04/12/2011  . Gait abnormality 06/22/2010  . Tibial tendinitis, posterior 06/22/2010    Past Medical History:  Diagnosis Date  . Diabetes mellitus    type II  . Down syndrome   . Family history of adverse reaction to anesthesia    mom has had n/v  . Gastritis   . GERD (gastroesophageal reflux disease)   . Headache   . Hepatic steatosis   . Hidradenitis   . Hypothyroidism   . Irritable bowel syndrome   . Periumbilical hernia   . Pneumonia   . Restless legs   . Tendonitis    chronic in left foot  . Thyroid disease    hypothyroidism    Past Surgical History:  Procedure Laterality Date  . AXILLARY HIDRADENITIS EXCISION Bilateral   . CHOLECYSTECTOMY    . HERNIA REPAIR     multiple  . INCISIONAL HERNIA REPAIR  05/30/2015   LAPROSCOPIC  . INCISIONAL HERNIA REPAIR N/A 05/30/2015   Procedure: LAPAROSCOPIC INCISIONAL HERNIA;  Surgeon: Rolm Bookbinder, MD;  Location: [facility];  Service: General;  Laterality: N/A;  . INSERTION OF MESH N/A 05/30/2015   Procedure: INSERTION OF MESH;  Surgeon: Rolm Bookbinder, MD;  Location: [facility];  Service: General;  Laterality: N/A;  . KNEE ARTHROSCOPY     right knee  . LAPAROSCOPY N/A 05/30/2015   Procedure: LAPAROSCOPY DIAGNOSTIC;  Surgeon: Rolm Bookbinder, MD;  Location: [facility];  Service: General;  Laterality:  N/A;  . PARTIAL KNEE ARTHROPLASTY Right 10/30/2017   Procedure: UNICOMPARTMENTAL RIGHT KNEE LATERAL;  Surgeon: Paralee Cancel, MD;  [facility];  Service: Orthopedics;  Laterality: Right;  90 mins  . TONSILLECTOMY     adenoids    Social History   Tobacco Use  . Smoking status: Never Smoker  . Smokeless tobacco: Never Used  Substance Use Topics  . Alcohol use: No    Alcohol/week: 0.0 standard drinks  . Drug use: No    Family History  Problem Relation Age of Onset  . Thyroid cancer Mother   . Diabetes type II Mother   . High  Cholesterol Mother   . Heart disease Mother   . Diabetes type II Father   . Hypertension Father   . Stroke Father     Allergies  Allergen Reactions  . Vancomycin Itching  . Cefuroxime Hives and Swelling    Unspecified location of swelling  . Cefuroxime Axetil Hives  . Sulfa Antibiotics Hives    Medication list has been reviewed and updated.  Current Outpatient Medications on File Prior to Visit  Medication Sig Dispense Refill  . acetaminophen (TYLENOL) 325 MG tablet Take 650 mg by mouth every 6 (six) hours as needed.    Marland Kitchen allopurinol (ZYLOPRIM) 300 MG tablet Take 1 tablet (300 mg total) by mouth daily. 90 tablet 3  . Blood Glucose Monitoring Suppl (BLOOD GLUCOSE METER KIT AND SUPPLIES) Dispense based on patient and insurance preference. To check blood sugars daily FOR ICD-9 250.00 1 each 0  . cetirizine (ZYRTEC) 10 MG tablet Take 10 mg by mouth daily as needed for allergies.     . cholestyramine (QUESTRAN) 4 g packet DISSOLVE & TAKE 1 POWDER BY MOUTH TWICE DAILY 180 each 3  . clobetasol cream (TEMOVATE) 8.91 % Apply 1 application topically daily as needed (for eczema).     . dicyclomine (BENTYL) 20 MG tablet Take 1 tablet (20 mg total) by mouth 4 (four) times daily -  before meals and at bedtime. (Patient taking differently: Take 20 mg by mouth 2 (two) times a day. ) 360 tablet 3  . DULoxetine (CYMBALTA) 30 MG capsule Take 1 capsule (30 mg total) by mouth daily. 30 capsule 3  . empagliflozin (JARDIANCE) 25 MG TABS tablet Take 25 mg by mouth daily.    . Glucose Blood (BLOOD GLUCOSE TEST STRIPS) STRP Use to test blood sugar up to twice a day 100 each 3  . JOLESSA 0.15-0.03 MG tablet Take 1 tablet by mouth once daily 91 tablet 2  . levothyroxine (SYNTHROID) 137 MCG tablet TAKE 1 TABLET BY MOUTH IN THE MORNING . APPOINTMENT REQUIRED FOR FUTURE REFILLS 90 tablet 1  . metFORMIN (GLUCOPHAGE) 1000 MG tablet Take 1,000 mg by mouth 2 (two) times daily.   2  . naproxen sodium (ALEVE) 220 MG  tablet Take 220 mg by mouth.    . ONE TOUCH ULTRA TEST test strip USE TO CHECK BLOOD SUGAR ONCE DAILY (ICD CODE:25.00) 100 each 3  . Semaglutide,0.25 or 0.5MG/DOS, (OZEMPIC, 0.25 OR 0.5 MG/DOSE,) 2 MG/1.5ML SOPN Inject 0.5 mg into the skin once a week. Saturdays     No current facility-administered medications on file prior to visit.    Review of Systems:  As per HPI- otherwise negative. Accompanied today by her mother Wanda Oneill who contributes to the history  Physical Examination: Vitals:   04/21/19 1443  BP: 122/81  Pulse: 95  Resp: 16  Temp: (!) 96.4 F (35.8 C)  SpO2: 96%   Vitals:   04/21/19 1443  Weight: 248 lb (112.5 kg)  Height: '5\' 4"'  (1.626 m)   Body mass index is 42.57 kg/m. Ideal Body Weight: Weight in (lb) to have BMI = 25: 145.3  GEN: no acute distress.  Obese, otherwise looks well and her normal self HEENT: Atraumatic, Normocephalic.  Ears and Nose: No external deformity.  Removed a small amount of dry earwax from the external ear canals with curette.  TM otherwise normal bilaterally CV: RRR, No M/G/R. No JVD. No thrill. No extra heart sounds. PULM: CTA B, no wheezes, crackles, rhonchi. No retractions. No resp. distress. No accessory muscle use. ABD: S, NT, ND, +BS. No rebound. No HSM. EXTR: No c/c/e PSYCH: Normally interactive. Conversant.  She has evidence of Candida intertrigo under both breasts-mild She has more severe Candida intertrigo in her abdominal folds and groin area   Assessment and Plan: Candidal intertrigo - Plan: nystatin (MYCOSTATIN/NYSTOP) powder, fluconazole (DIFLUCAN) 50 MG tablet  Here today with concern of rather severe and persistent Candida intertrigo.  Her family is already appropriately started a topical antifungal cream and given her 2 doses of Diflucan a few days apart without much improvement; her father is Dr. Ruben Reason We discussed general conservative measures such as keeping the skin folds clean and dry, I recommended using a  hair dryer on cool setting whenever the skin is damp We will try a nystatin powder up to 3 times daily as needed, and oral fluconazole 50 mg daily for up to 1 month For leukemia post asked her progress This visit occurred during the SARS-CoV-2 public health emergency.  Safety protocols were in place, including screening questions prior to the visit, additional usage of staff PPE, and extensive cleaning of exam room while observing appropriate contact time as indicated for disinfecting solutions.     Signed Lamar Blinks, MD

## 2019-04-21 NOTE — Patient Instructions (Addendum)
It was great to see you again today, as always Let's try a nystatin powder up to three times a day, and a low dose diflucan daily for up to one month Please try to keep your skin folds dry; absorbent powder can help, and you might try a hair dryer on cool setting as needed if you are sweaty of damp  Let me know if not better in a week or so- Sooner if worse.

## 2019-04-26 ENCOUNTER — Ambulatory Visit: Payer: Medicare HMO | Admitting: Sports Medicine

## 2019-04-29 DIAGNOSIS — E039 Hypothyroidism, unspecified: Secondary | ICD-10-CM | POA: Diagnosis not present

## 2019-04-29 DIAGNOSIS — E781 Pure hyperglyceridemia: Secondary | ICD-10-CM | POA: Diagnosis not present

## 2019-04-29 DIAGNOSIS — E1165 Type 2 diabetes mellitus with hyperglycemia: Secondary | ICD-10-CM | POA: Diagnosis not present

## 2019-05-03 DIAGNOSIS — M25511 Pain in right shoulder: Secondary | ICD-10-CM | POA: Diagnosis not present

## 2019-05-03 DIAGNOSIS — M19011 Primary osteoarthritis, right shoulder: Secondary | ICD-10-CM | POA: Diagnosis not present

## 2019-05-04 ENCOUNTER — Ambulatory Visit: Payer: Medicare HMO | Attending: Family

## 2019-05-04 DIAGNOSIS — Z23 Encounter for immunization: Secondary | ICD-10-CM

## 2019-05-04 NOTE — Progress Notes (Addendum)
   Covid-19 Vaccination Clinic  Name:  CATHRINE KHALEEL    MRN: [id number] DOB: [birth year]  05/04/2019  Ms. Kreiling was observed post Covid-19 immunization for 30 minutes without incident. She was provided with Vaccine Information Sheet and instruction to access the V-Safe system.   Ms. Grosch was instructed to call 911 with any severe reactions post vaccine: Marland Kitchen Difficulty breathing  . Swelling of face and throat  . A fast heartbeat  . A bad rash all over body  . Dizziness and weakness   Immunizations Administered    Name Date Dose VIS Date Route   Moderna COVID-19 Vaccine 05/04/2019  3:16 PM 0.5 mL 01/05/2019 Intramuscular   Manufacturer: Moderna   Lot: [id number]   [id number]

## 2019-05-10 ENCOUNTER — Other Ambulatory Visit: Payer: Self-pay | Admitting: Family Medicine

## 2019-05-10 DIAGNOSIS — B372 Candidiasis of skin and nail: Secondary | ICD-10-CM

## 2019-05-10 MED ORDER — ITRACONAZOLE 100 MG PO CAPS: 200.0000 mg | ORAL_CAPSULE | Freq: Every day | ORAL | 0 refills | Status: DC

## 2019-05-10 NOTE — Progress Notes (Signed)
Received the following message from Ahana's mom, Wanda Oneill  Miia's (09-15-1980) rash in her breast and groin which we first saw March 10 has not improved on the meds you prescribed.  Do you want to see her or do something different?  Thank you, Wanda Oneill  She has failed both topical antifungals and oral Diflucan As such we will try itraconazole 200 mg daily for 1 week  Meds ordered this encounter  Medications  . itraconazole (SPORANOX) 100 MG capsule    Sig: Take 2 capsules (200 mg total) by mouth daily. Take for 7 days    Dispense:  14 capsule    Refill:  0   Replied to her mom's MyChart account

## 2019-05-12 ENCOUNTER — Telehealth: Payer: Self-pay

## 2019-05-12 NOTE — Telephone Encounter (Signed)
PA initiated via Covermymeds; KEY: [id number]. Awaiting determination.

## 2019-05-13 NOTE — Telephone Encounter (Signed)
PA approved through 02/04/2020

## 2019-05-20 ENCOUNTER — Other Ambulatory Visit: Payer: Self-pay

## 2019-05-20 ENCOUNTER — Emergency Department (HOSPITAL_COMMUNITY): Payer: Medicare HMO

## 2019-05-20 ENCOUNTER — Inpatient Hospital Stay (HOSPITAL_COMMUNITY)
Admission: EM | Admit: 2019-05-20 | Discharge: 2019-05-23 | DRG: 091 | Disposition: A | Discharge status: Home / Self Care | Payer: Medicare HMO | Attending: Internal Medicine | Admitting: Internal Medicine

## 2019-05-20 DIAGNOSIS — G8918 Other acute postprocedural pain: Secondary | ICD-10-CM | POA: Diagnosis not present

## 2019-05-20 DIAGNOSIS — J9600 Acute respiratory failure, unspecified whether with hypoxia or hypercapnia: Secondary | ICD-10-CM

## 2019-05-20 DIAGNOSIS — Y848 Other medical procedures as the cause of abnormal reaction of the patient, or of later complication, without mention of misadventure at the time of the procedure: Secondary | ICD-10-CM | POA: Diagnosis present

## 2019-05-20 DIAGNOSIS — J9811 Atelectasis: Secondary | ICD-10-CM | POA: Diagnosis present

## 2019-05-20 DIAGNOSIS — M7551 Bursitis of right shoulder: Secondary | ICD-10-CM | POA: Diagnosis not present

## 2019-05-20 DIAGNOSIS — G588 Other specified mononeuropathies: Secondary | ICD-10-CM | POA: Diagnosis present

## 2019-05-20 DIAGNOSIS — Z7984 Long term (current) use of oral hypoglycemic drugs: Secondary | ICD-10-CM

## 2019-05-20 DIAGNOSIS — R0902 Hypoxemia: Secondary | ICD-10-CM | POA: Diagnosis not present

## 2019-05-20 DIAGNOSIS — M7581 Other shoulder lesions, right shoulder: Secondary | ICD-10-CM | POA: Diagnosis not present

## 2019-05-20 DIAGNOSIS — Z8249 Family history of ischemic heart disease and other diseases of the circulatory system: Secondary | ICD-10-CM | POA: Diagnosis not present

## 2019-05-20 DIAGNOSIS — K589 Irritable bowel syndrome without diarrhea: Secondary | ICD-10-CM | POA: Diagnosis present

## 2019-05-20 DIAGNOSIS — J9601 Acute respiratory failure with hypoxia: Secondary | ICD-10-CM | POA: Diagnosis present

## 2019-05-20 DIAGNOSIS — E119 Type 2 diabetes mellitus without complications: Secondary | ICD-10-CM | POA: Diagnosis not present

## 2019-05-20 DIAGNOSIS — Z79899 Other long term (current) drug therapy: Secondary | ICD-10-CM

## 2019-05-20 DIAGNOSIS — Z9049 Acquired absence of other specified parts of digestive tract: Secondary | ICD-10-CM

## 2019-05-20 DIAGNOSIS — R0689 Other abnormalities of breathing: Secondary | ICD-10-CM | POA: Diagnosis not present

## 2019-05-20 DIAGNOSIS — E039 Hypothyroidism, unspecified: Secondary | ICD-10-CM | POA: Diagnosis present

## 2019-05-20 DIAGNOSIS — G2581 Restless legs syndrome: Secondary | ICD-10-CM | POA: Diagnosis present

## 2019-05-20 DIAGNOSIS — M7541 Impingement syndrome of right shoulder: Secondary | ICD-10-CM | POA: Diagnosis not present

## 2019-05-20 DIAGNOSIS — M797 Fibromyalgia: Secondary | ICD-10-CM | POA: Diagnosis present

## 2019-05-20 DIAGNOSIS — M19011 Primary osteoarthritis, right shoulder: Secondary | ICD-10-CM | POA: Diagnosis not present

## 2019-05-20 DIAGNOSIS — Z6841 Body Mass Index (BMI) 40.0 and over, adult: Secondary | ICD-10-CM

## 2019-05-20 DIAGNOSIS — K219 Gastro-esophageal reflux disease without esophagitis: Secondary | ICD-10-CM | POA: Diagnosis present

## 2019-05-20 DIAGNOSIS — Z79890 Hormone replacement therapy (postmenopausal): Secondary | ICD-10-CM | POA: Diagnosis not present

## 2019-05-20 DIAGNOSIS — G8929 Other chronic pain: Secondary | ICD-10-CM | POA: Diagnosis present

## 2019-05-20 DIAGNOSIS — J986 Disorders of diaphragm: Secondary | ICD-10-CM | POA: Diagnosis not present

## 2019-05-20 DIAGNOSIS — R0602 Shortness of breath: Secondary | ICD-10-CM

## 2019-05-20 DIAGNOSIS — Z882 Allergy status to sulfonamides status: Secondary | ICD-10-CM

## 2019-05-20 DIAGNOSIS — Z833 Family history of diabetes mellitus: Secondary | ICD-10-CM | POA: Diagnosis not present

## 2019-05-20 DIAGNOSIS — Z96651 Presence of right artificial knee joint: Secondary | ICD-10-CM | POA: Diagnosis present

## 2019-05-20 DIAGNOSIS — M542 Cervicalgia: Secondary | ICD-10-CM | POA: Diagnosis present

## 2019-05-20 DIAGNOSIS — Q909 Down syndrome, unspecified: Secondary | ICD-10-CM | POA: Diagnosis not present

## 2019-05-20 DIAGNOSIS — M24111 Other articular cartilage disorders, right shoulder: Secondary | ICD-10-CM | POA: Diagnosis not present

## 2019-05-20 DIAGNOSIS — M109 Gout, unspecified: Secondary | ICD-10-CM | POA: Diagnosis present

## 2019-05-20 DIAGNOSIS — G9782 Other postprocedural complications and disorders of nervous system: Principal | ICD-10-CM | POA: Diagnosis present

## 2019-05-20 DIAGNOSIS — Z881 Allergy status to other antibiotic agents status: Secondary | ICD-10-CM

## 2019-05-20 DIAGNOSIS — Z20822 Contact with and (suspected) exposure to covid-19: Secondary | ICD-10-CM | POA: Diagnosis present

## 2019-05-20 LAB — POC SARS CORONAVIRUS 2 AG -  ED: SARS Coronavirus 2 Ag: NEGATIVE

## 2019-05-20 LAB — I-STAT CHEM 8, ED
BUN: 16 mg/dL (ref 6–20)
Calcium, Ion: 1.09 mmol/L — ABNORMAL LOW (ref 1.15–1.40)
Chloride: 107 mmol/L (ref 98–111)
Creatinine, Ser: 0.6 mg/dL (ref 0.44–1.00)
Glucose, Bld: 229 mg/dL — ABNORMAL HIGH (ref 70–99)
HCT: 42 % (ref 36.0–46.0)
Hemoglobin: 14.3 g/dL (ref 12.0–15.0)
Potassium: 4.9 mmol/L (ref 3.5–5.1)
Sodium: 138 mmol/L (ref 135–145)
TCO2: 22 mmol/L (ref 22–32)

## 2019-05-20 LAB — CBC WITH DIFFERENTIAL/PLATELET
Abs Immature Granulocytes: 0.52 10*3/uL — ABNORMAL HIGH (ref 0.00–0.07)
Basophils Absolute: 0.1 10*3/uL (ref 0.0–0.1)
Basophils Relative: 1 %
Eosinophils Absolute: 0 10*3/uL (ref 0.0–0.5)
Eosinophils Relative: 0 %
HCT: 44.8 % (ref 36.0–46.0)
Hemoglobin: 13.5 g/dL (ref 12.0–15.0)
Immature Granulocytes: 4 %
Lymphocytes Relative: 7 %
Lymphs Abs: 0.9 10*3/uL (ref 0.7–4.0)
MCH: 29 pg (ref 26.0–34.0)
MCHC: 30.1 g/dL (ref 30.0–36.0)
MCV: 96.3 fL (ref 80.0–100.0)
Monocytes Absolute: 0.1 10*3/uL (ref 0.1–1.0)
Monocytes Relative: 1 %
Neutro Abs: 11.4 10*3/uL — ABNORMAL HIGH (ref 1.7–7.7)
Neutrophils Relative %: 87 %
Platelets: 322 10*3/uL (ref 150–400)
RBC: 4.65 MIL/uL (ref 3.87–5.11)
RDW: 17.5 % — ABNORMAL HIGH (ref 11.5–15.5)
WBC: 13.1 10*3/uL — ABNORMAL HIGH (ref 4.0–10.5)
nRBC: 0 % (ref 0.0–0.2)

## 2019-05-20 LAB — BASIC METABOLIC PANEL
Anion gap: 15 (ref 5–15)
BUN: 13 mg/dL (ref 6–20)
CO2: 17 mmol/L — ABNORMAL LOW (ref 22–32)
Calcium: 8 mg/dL — ABNORMAL LOW (ref 8.9–10.3)
Chloride: 105 mmol/L (ref 98–111)
Creatinine, Ser: 0.93 mg/dL (ref 0.44–1.00)
GFR calc Af Amer: >60 mL/min (ref >60)
GFR calc non Af Amer: >60 mL/min (ref >60)
Glucose, Bld: 248 mg/dL — ABNORMAL HIGH (ref 70–99)
Potassium: 6.2 mmol/L — ABNORMAL HIGH (ref 3.5–5.1)
Sodium: 137 mmol/L (ref 135–145)

## 2019-05-20 LAB — BRAIN NATRIURETIC PEPTIDE: B Natriuretic Peptide: 34.7 pg/mL (ref 0.0–100.0)

## 2019-05-20 LAB — TROPONIN I (HIGH SENSITIVITY)
Troponin I (High Sensitivity): 13 ng/L (ref <18)
Troponin I (High Sensitivity): 23 ng/L — ABNORMAL HIGH (ref <18)

## 2019-05-20 MED ORDER — HEPARIN SODIUM (PORCINE) 5000 UNIT/ML IJ SOLN
5000.0000 [IU] | Freq: Three times a day (TID) | INTRAMUSCULAR | Status: DC
  Administered 2019-05-21 – 2019-05-23 (×7): 5000 [IU] via SUBCUTANEOUS
  Filled 2019-05-20 (×5): qty 1

## 2019-05-20 MED ORDER — DICYCLOMINE HCL 20 MG PO TABS
20.0000 mg | ORAL_TABLET | Freq: Two times a day (BID) | ORAL | Status: DC
  Administered 2019-05-21 (×2): 20 mg via ORAL
  Filled 2019-05-20 (×3): qty 1

## 2019-05-20 MED ORDER — ONDANSETRON 4 MG PO TBDP
4.0000 mg | ORAL_TABLET | Freq: Once | ORAL | Status: AC
  Administered 2019-05-20: 21:00:00 4 mg via ORAL
  Filled 2019-05-20: qty 1

## 2019-05-20 MED ORDER — POLYETHYLENE GLYCOL 3350 17 G PO PACK: 17.0000 g | PACK | Freq: Every day | ORAL | Status: DC | PRN

## 2019-05-20 MED ORDER — LEVOFLOXACIN IN D5W 750 MG/150ML IV SOLN
750.0000 mg | INTRAVENOUS | Status: DC
  Administered 2019-05-21 – 2019-05-22 (×2): 750 mg via INTRAVENOUS
  Filled 2019-05-20 (×2): qty 150

## 2019-05-20 MED ORDER — OXYCODONE-ACETAMINOPHEN 5-325 MG PO TABS
1.0000 | ORAL_TABLET | Freq: Once | ORAL | Status: AC
  Administered 2019-05-20: 21:00:00 1 via ORAL
  Filled 2019-05-20: qty 1

## 2019-05-20 MED ORDER — LACTATED RINGERS IV SOLN: INTRAVENOUS | Status: DC

## 2019-05-20 MED ORDER — DOCUSATE SODIUM 100 MG PO CAPS: 100.0000 mg | ORAL_CAPSULE | Freq: Two times a day (BID) | ORAL | Status: DC | PRN

## 2019-05-20 MED ORDER — LEVOTHYROXINE SODIUM 25 MCG PO TABS: 137.0000 ug | ORAL_TABLET | Freq: Every evening | ORAL | Status: DC

## 2019-05-20 NOTE — Progress Notes (Signed)
Pharmacy Antibiotic Note  Wanda Oneill is a 39 y.o. female admitted on 05/20/2019 with respiratory failure, concern for aspiration.  Pharmacy has been consulted for levaquin dosing.  Noted cephalosporin allergy, no hx of tolerance to other cephalosporins or pcn.  Plan: Levaquin 750mg  IV every 24 hours Monitor renal function, clinical progression and LOT     Temp (24hrs), Avg:98.4 F (36.9 C), Min:98.4 F (36.9 C), Max:98.4 F (36.9 C)  Recent Labs  Lab 05/20/19 1813 05/20/19 2032  WBC 13.1*  --   CREATININE 0.93 0.60    CrCl cannot be calculated (Unknown ideal weight.).    Allergies  Allergen Reactions  . Vancomycin Itching  . Cefuroxime Hives and Swelling    Unspecified location of swelling  . Cefuroxime Axetil Hives  . Sulfa Antibiotics Hives    Bertis Ruddy, PharmD Clinical Pharmacist ED Pharmacist Phone # [phone] 05/20/2019 10:55 PM

## 2019-05-20 NOTE — ED Provider Notes (Signed)
[facility] EMERGENCY DEPARTMENT Provider Note   CSN: [id number] Arrival date & time: 05/20/19  1801     History Chief Complaint  Patient presents with  . Shortness of Breath    Wanda Oneill is a 39 y.o. female.  She has a history of diabetes Down syndrome.  She was at the [facility] surgical center today for shoulder operation.  Since the procedure she has been tachypneic and requiring oxygen.  She denies any chest pain.  She says she has abdominal pain from a ventral hernia.  The history is provided by the patient, the EMS personnel and a relative.  Shortness of Breath Severity:  Severe Onset quality:  Sudden Timing:  Constant Progression:  Unchanged Chronicity:  New Context comment:  Surgery Relieved by:  Nothing Worsened by:  Nothing Ineffective treatments:  Position changes and oxygen Associated symptoms: abdominal pain (chronic)   Associated symptoms: no chest pain, no cough, no diaphoresis, no fever, no headaches, no hemoptysis, no neck pain, no rash, no sore throat and no sputum production   Risk factors: recent surgery        Past Medical History:  Diagnosis Date  . Diabetes mellitus    type II  . Down syndrome   . Family history of adverse reaction to anesthesia    mom has had n/v  . Gastritis   . GERD (gastroesophageal reflux disease)   . Headache   . Hepatic steatosis   . Hidradenitis   . Hypothyroidism   . Irritable bowel syndrome   . Periumbilical hernia   . Pneumonia   . Restless legs   . Tendonitis    chronic in left foot  . Thyroid disease    hypothyroidism    Patient Active Problem List   Diagnosis Date Noted  . Fibromyalgia 03/09/2019  . Neck pain, chronic 03/09/2019  . Acute shoulder bursitis, right 02/03/2019  . Bile salt-induced diarrhea 09/25/2018  . Arthritis of left acromioclavicular joint 09/23/2018  . Precordial chest pain 08/11/2018  . Morbid obesity ([place]) 10/31/2017  . S/P right UKR, lateral 10/30/2017    . Incarcerated epigastric hernia 05/30/2015  . Left foot pain 08/09/2014  . Chronic diarrhea 07/27/2014  . Gout 08/10/2012  . Acid reflux 01/22/2012  . Down syndrome 11/11/2011  . Hernia of abdominal wall 10/21/2011  . Psoriasis 10/21/2011  . Type 2 diabetes mellitus, uncontrolled ([place]) 04/12/2011  . Hypothyroid 04/12/2011  . Gait abnormality 06/22/2010  . Tibial tendinitis, posterior 06/22/2010    Past Surgical History:  Procedure Laterality Date  . AXILLARY HIDRADENITIS EXCISION Bilateral   . CHOLECYSTECTOMY    . HERNIA REPAIR     multiple  . INCISIONAL HERNIA REPAIR  05/30/2015   LAPROSCOPIC  . INCISIONAL HERNIA REPAIR N/A 05/30/2015   Procedure: LAPAROSCOPIC INCISIONAL HERNIA;  Surgeon: Rolm Bookbinder, MD;  Location: [place];  Service: General;  Laterality: N/A;  . INSERTION OF MESH N/A 05/30/2015   Procedure: INSERTION OF MESH;  Surgeon: Rolm Bookbinder, MD;  Location: [place];  Service: General;  Laterality: N/A;  . KNEE ARTHROSCOPY     right knee  . LAPAROSCOPY N/A 05/30/2015   Procedure: LAPAROSCOPY DIAGNOSTIC;  Surgeon: Rolm Bookbinder, MD;  Location: [place];  Service: General;  Laterality: N/A;  . PARTIAL KNEE ARTHROPLASTY Right 10/30/2017   Procedure: UNICOMPARTMENTAL RIGHT KNEE LATERAL;  Surgeon: Paralee Cancel, MD;  Location: [facility];  Service: Orthopedics;  Laterality: Right;  90 mins  . TONSILLECTOMY     adenoids  OB History   No obstetric history on file.     Family History  Problem Relation Age of Onset  . Thyroid cancer Mother   . Diabetes type II Mother   . High Cholesterol Mother   . Heart disease Mother   . Diabetes type II Father   . Hypertension Father   . Stroke Father     Social History   Tobacco Use  . Smoking status: Never Smoker  . Smokeless tobacco: Never Used  Substance Use Topics  . Alcohol use: No    Alcohol/week: 0.0 standard drinks  . Drug use: No    Home Medications Prior to Admission medications   Medication Sig  Start Date End Date Taking? Authorizing Provider  acetaminophen (TYLENOL) 325 MG tablet Take 650 mg by mouth every 6 (six) hours as needed.    [provider]  allopurinol (ZYLOPRIM) 300 MG tablet Take 1 tablet (300 mg total) by mouth daily. 07/07/18   Copland, Gay Filler, MD  Blood Glucose Monitoring Suppl (BLOOD GLUCOSE METER KIT AND SUPPLIES) Dispense based on patient and insurance preference. To check blood sugars daily FOR ICD-9 250.00 08/31/13   Darlyne Russian, MD  cetirizine (ZYRTEC) 10 MG tablet Take 10 mg by mouth daily as needed for allergies.     [provider]  cholestyramine (QUESTRAN) 4 g packet DISSOLVE & TAKE 1 POWDER BY MOUTH TWICE DAILY 09/25/18   Zehr, Janett Billow D, PA-C  clobetasol cream (TEMOVATE) 6.38 % Apply 1 application topically daily as needed (for eczema).  10/07/12   [provider]  dicyclomine (BENTYL) 20 MG tablet Take 1 tablet (20 mg total) by mouth 4 (four) times daily -  before meals and at bedtime. Patient taking differently: Take 20 mg by mouth 2 (two) times a day.  04/25/17   Copland, Gay Filler, MD  DULoxetine (CYMBALTA) 30 MG capsule Take 1 capsule (30 mg total) by mouth daily. 03/09/19   Silverio Decamp, MD  empagliflozin (JARDIANCE) 25 MG TABS tablet Take 25 mg by mouth daily.    [provider]  fluconazole (DIFLUCAN) 50 MG tablet Take 1 tablet (50 mg total) by mouth daily. 04/21/19   Copland, Gay Filler, MD  Glucose Blood (BLOOD GLUCOSE TEST STRIPS) STRP Use to test blood sugar up to twice a day 07/07/18   Copland, Gay Filler, MD  itraconazole (SPORANOX) 100 MG capsule Take 2 capsules (200 mg total) by mouth daily. Take for 7 days 05/10/19   Copland, Gay Filler, MD  JOLESSA 0.15-0.03 MG tablet Take 1 tablet by mouth once daily 02/02/19   Copland, Gay Filler, MD  levothyroxine (SYNTHROID) 137 MCG tablet TAKE 1 TABLET BY MOUTH IN THE MORNING . APPOINTMENT REQUIRED FOR FUTURE REFILLS 02/08/19   Copland, Gay Filler, MD  metFORMIN (GLUCOPHAGE)  1000 MG tablet Take 1,000 mg by mouth 2 (two) times daily.  01/16/16   [provider]  naproxen sodium (ALEVE) 220 MG tablet Take 220 mg by mouth.    [provider]  nystatin (MYCOSTATIN/NYSTOP) powder Apply 1 application topically 3 (three) times daily. Use as needed for candida intertrigo 04/21/19   Copland, Gay Filler, MD  ONE TOUCH ULTRA TEST test strip USE TO CHECK BLOOD SUGAR ONCE DAILY (ICD CODE:25.00) 05/08/13   Weber, Damaris Hippo, PA-C  Semaglutide,0.25 or 0.5MG/DOS, (OZEMPIC, 0.25 OR 0.5 MG/DOSE,) 2 MG/1.5ML SOPN Inject 0.5 mg into the skin once a week. Saturdays    [provider]    Allergies  Vancomycin, Cefuroxime, Cefuroxime axetil, and Sulfa antibiotics  Review of Systems   Review of Systems  Constitutional: Negative for diaphoresis and fever.  HENT: Negative for sore throat.   Eyes: Negative for visual disturbance.  Respiratory: Positive for shortness of breath. Negative for cough, hemoptysis and sputum production.   Cardiovascular: Negative for chest pain.  Gastrointestinal: Positive for abdominal pain (chronic).  Genitourinary: Negative for dysuria.  Musculoskeletal: Negative for neck pain.  Skin: Negative for rash.  Neurological: Negative for headaches.    Physical Exam Updated Vital Signs BP (!) 141/91   Pulse (!) 110   Resp (!) 30   SpO2 93%   Physical Exam Vitals and nursing note reviewed.  Constitutional:      General: She is not in acute distress.    Appearance: She is well-developed. She is obese.  HENT:     Head: Normocephalic and atraumatic.  Eyes:     Conjunctiva/sclera: Conjunctivae normal.  Cardiovascular:     Rate and Rhythm: Regular rhythm. Tachycardia present.     Heart sounds: No murmur.  Pulmonary:     Effort: Tachypnea and accessory muscle usage present. No respiratory distress.     Breath sounds: No stridor. Decreased breath sounds present.  Abdominal:     Palpations: Abdomen is soft.     Tenderness: There is  no abdominal tenderness.  Musculoskeletal:        General: No tenderness. Normal range of motion.     Cervical back: Neck supple.     Right lower leg: No tenderness.     Left lower leg: No tenderness.     Comments: She has bandage over her right shoulder.  Distal pulses intact.  Skin:    General: Skin is warm and dry.     Capillary Refill: Capillary refill takes less than 2 seconds.  Neurological:     General: No focal deficit present.     Mental Status: She is alert.     GCS: GCS eye subscore is 4. GCS verbal subscore is 5. GCS motor subscore is 6.     ED Results / Procedures / Treatments   Labs (all labs ordered are listed, but only abnormal results are displayed) Labs Reviewed  BASIC METABOLIC PANEL - Abnormal; Notable for the following components:      Result Value   Potassium 6.2 (*)    CO2 17 (*)    Glucose, Bld 248 (*)    Calcium 8.0 (*)    All other components within normal limits  CBC WITH DIFFERENTIAL/PLATELET - Abnormal; Notable for the following components:   WBC 13.1 (*)    RDW 17.5 (*)    Neutro Abs 11.4 (*)    Abs Immature Granulocytes 0.52 (*)    All other components within normal limits  I-STAT CHEM 8, ED - Abnormal; Notable for the following components:   Glucose, Bld 229 (*)    Calcium, Ion 1.09 (*)    All other components within normal limits  SARS CORONAVIRUS 2 (TAT 6-24 HRS)  BRAIN NATRIURETIC PEPTIDE  TROPONIN I (HIGH SENSITIVITY)  TROPONIN I (HIGH SENSITIVITY)    EKG EKG Interpretation  Date/Time:  Thursday May 20 2019 18:07:28 EDT Ventricular Rate:  104 PR Interval:    QRS Duration: 79 QT Interval:  326 QTC Calculation: 425 R Axis:   -7 Text Interpretation: Sinus tachycardia Low voltage, precordial leads lower voltage than prior 9/19 Confirmed by Aletta Edouard [phone]) on 05/20/2019 6:15:31 PM   Radiology DG Chest Port 1  View  Result Date: 05/20/2019 CLINICAL DATA:  Shortness of breath EXAM: PORTABLE CHEST 1 VIEW COMPARISON:   02/10/2016 FINDINGS: Markedly low lung volumes. Elevation of the right diaphragm. Enlarged cardiomediastinal silhouette with vascular congestion and perihilar interstitial opacities, possible edema. There may be a small right-sided pleural effusion. IMPRESSION: 1. Marked hypoventilatory change with elevated right diaphragm. 2. Enlarged cardiomediastinal silhouette compared to prior, likely augmented by low lung volume and portable technique. There does appear to be vascular congestion and mild perihilar edema. Suspect that there may be a small right-sided pleural effusion. Electronically Signed   By: Donavan Foil M.D.   On: 05/20/2019 19:04    Procedures .Critical Care Performed by: [name], MD Authorized by: [name], MD   Critical care provider statement:    Critical care time (minutes):  45   Critical care time was exclusive of:  Separately billable procedures and treating other patients   Critical care was necessary to treat or prevent imminent or life-threatening deterioration of the following conditions:  Respiratory failure   Critical care was time spent personally by me on the following activities:  Discussions with consultants, evaluation of patient's response to treatment, examination of patient, ordering and performing treatments and interventions, ordering and review of laboratory studies, ordering and review of radiographic studies, pulse oximetry, re-evaluation of patient's condition, obtaining history from patient or surrogate, review of old charts and development of treatment plan with patient or surrogate   I assumed direction of critical care for this patient from another provider in my specialty: no     (including critical care time)  Medications Ordered in ED Medications  oxyCODONE-acetaminophen (PERCOCET/ROXICET) 5-325 MG per tablet 1 tablet (1 tablet Oral Given 05/20/19 2118)  ondansetron (ZOFRAN-ODT) disintegrating tablet 4 mg (4 mg Oral Given 05/20/19  2118)    ED Course  I have reviewed the triage vital signs and the nursing notes.  Pertinent labs & imaging results that were available during my care of the patient were reviewed by me and considered in my medical decision making (see chart for details).  Clinical Course as of May 21 1006  Thu May 20, 2019  1841 Chest x-ray interpreted by me as poor inspiratory effort, no gross pneumothorax.   [MB]  [id number] Discussed with anesthesia on-call They feel like the patient probably had an interscalene block and has paralysis of her right hemidiaphragm.  That fits with her right shoulder being operated on and on the x-ray the right hemidiaphragm looks elevated.   [MB]  1902 Updated the patient's mother with the plan for admission.   [MB]  [id number] Discussed with Dr. Lyla Glassing from [facility] partner of Dr. Theda Sers.  He recommended admission to medical service and they can consult them if needed.   [MB]  2113 Discussed with Dr. Hal Hope from [facility] hospitalist who is concerned the patient's oxygen requirement. I will talk to the ICU to see if she be more appropriate for there.   [MB]  2126 Discussed with critical care who will have somebody come down and evaluate the patient for admission.   [MB]  2200 Patient being seen by critical care.  They are accepting of the service and getting her put on some BiPAP.   [MB]  Fri May 21, 2019  1007 Creatinine: 0.69 [MB]    Clinical Course User Index [MB] [name], MD   MDM Rules/Calculators/A&P This patient complains of shortness of breath; this involves an extensive number of treatment Options and is  a complaint that carries with it a high risk of complications and Morbidity. The differential includes pneumothorax, pneumonia aspiration PE, mucous plugging, diaphragmatic problems  I ordered, reviewed and interpreted labs, which included elevated white count of 11.  Glucose also elevated at 181.  Calcium mildly low at 8.  Covid testing  negative.  Troponin mildly elevated and rising. I ordered medication Zofran and pain medicine I ordered imaging studies which included portable chest x-ray and I independently    visualized and interpreted imaging which showed poor inspiration with elevated right hemidiaphragm Additional history obtained from father and mother. Previous records obtained and reviewed operative and anesthesia notes that came with the patient from the surgical center I consulted anesthesia Dr. Glennon Mac along with [facility] hospitalist Dr. Hal Hope and critical care intensivist and discussed lab and imaging findings  Critical Interventions: Continue support of her breathing and eventual discussion to place on BiPAP  After the interventions stated above, I reevaluated the patient and found improvement in her respiratory status.  Patient will be admitted to the ICU for further management.  Final Clinical Impression(s) / ED Diagnoses Final diagnoses:  SOB (shortness of breath)  Hypoxia    Rx / DC Orders ED Discharge Orders    None       [name], MD 05/21/19 1009

## 2019-05-20 NOTE — ED Triage Notes (Signed)
Pt bib ems from surgery center where she had a procedure to R shoulder today. Pt has been monitored after procedure for several hours. Pt has remained tachypnic and hypoxic. Pt arrives on a NRB 15L with oxygen saturations of 94%. RR 40. 144/84. HR 106. Pt alert to baseline.

## 2019-05-20 NOTE — H&P (Signed)
NAME:  Wanda Oneill, MRN:  [id number], DOB:  [birth year], LOS: 0 ADMISSION DATE:  05/20/2019, CONSULTATION DATE:  4/15 REFERRING MD:  Dr Melina Copa, CHIEF COMPLAINT:  Respiratory distress  Brief History   39 year old female presenting with respiratory failure from surgery center after elective shoulder surgery under interscalene block. Imaging on admission showed elevated R hemidiaphragm. Started on BiPAP.   History of present illness   39 year old female with PMH as below, which is significant for diabetes mellitus and Down Syndrome. She is status post both doses of COVID-19 vaccine. She presented to outpatient surgery center on 4/15 for elective bone spur shaving of the R shoulder. Surgery done under interscalene block. Her father is here at bedside (He is a [place]) and tells me that from the moment the block was done he began noticing desaturations. Surgery was without complication. In the post-operative setting, however, she was dyspneic with new O2 requirement. This prompted transfer to [facility] ED, where she was requiring 15L NRB to maintain sats in the 90s. Chest radiograph concerning for elevated R hemidiaphragm. Anesthesia was consulted by ED, and they feel this is a likely consequence of the interscalene block. PCCM was consulted for admission.   Past Medical History   has a past medical history of Diabetes mellitus, Down syndrome, Family history of adverse reaction to anesthesia, Gastritis, GERD (gastroesophageal reflux disease), Headache, Hepatic steatosis, Hidradenitis, Hypothyroidism, Irritable bowel syndrome, Periumbilical hernia, Pneumonia, Restless legs, Tendonitis, and Thyroid disease.  Significant Hospital Events     Consults:    Procedures:    Significant Diagnostic Tests:    Micro Data:  Blood 4/15 >  Antimicrobials:  Levofloxacin 4/15 >  Interim history/subjective:    Objective   Blood pressure 132/85, pulse 100, temperature  98.4 F (36.9 C), temperature source Oral, resp. rate (!) 27, SpO2 95 %.       No intake or output data in the 24 hours ending 05/20/19 2206 There were no vitals filed for this visit.  Examination: General: Obese adult female in mild/moderate respiratory distress HENT: [place]/AT, PERRL, no JVD Lungs: rapid, shallow respirations. Diminished in bilateral bases.  Cardiovascular: RRR, no MRG Abdomen: Soft, non-tender, non-distended Extremities: No acute deformity or ROM limitation Neuro: Awake, alert, oriented, non-focal.   Resolved Hospital Problem list     Assessment & Plan:   Acute hypoxemic respiratory failure: likely secondary to paralyzed right hemidiaphragm as an expected complication of interscalene block. Cannot rule out aspiration into the right lower lobe causing atelectasis.  - Admit to ICU for tenuous respiratory status.  - Start BiPAP if she can tolerate, if not will try heated high flow.  - SpO2 goal > 92% - Levofloxacin for aspiration coverage. - Repeat imaging in AM  DM - CBG monitoring and SSI - Will hold metformin, semaglutide,  and empagliflozin until taking PO more reliably  Hypothyroidism - Continue home synthroid  Chronic abdominal pain: Ventral hernia - Continue home Bentyl   Best practice:  Diet: NPO except meds Pain/Anxiety/Delirium protocol (if indicated):  VAP protocol (if indicated):  DVT prophylaxis: AQH GI prophylaxis: NA Glucose control: SSI Mobility: BR Code Status: ICU Family Communication: Father updated bedside. Dr. Linna Darner.  Disposition: ICU  Labs   CBC: Recent Labs  Lab 05/20/19 1813 05/20/19 2032  WBC 13.1*  --   NEUTROABS 11.4*  --   HGB 13.5 14.3  HCT 44.8 42.0  MCV 96.3  --   PLT 322  --  Basic Metabolic Panel: Recent Labs  Lab 05/20/19 1813 05/20/19 2032  NA 137 138  K 6.2* 4.9  CL 105 107  CO2 17*  --   GLUCOSE 248* 229*  BUN 13 16  CREATININE 0.93 0.60  CALCIUM 8.0*  --    GFR: CrCl cannot be  calculated (Unknown ideal weight.). Recent Labs  Lab 05/20/19 1813  WBC 13.1*    Liver Function Tests: No results for input(s): AST, ALT, ALKPHOS, BILITOT, PROT, ALBUMIN in the last 168 hours. No results for input(s): LIPASE, AMYLASE in the last 168 hours. No results for input(s): AMMONIA in the last 168 hours.  ABG    Component Value Date/Time   TCO2 22 05/20/2019 2032     Coagulation Profile: No results for input(s): INR, PROTIME in the last 168 hours.  Cardiac Enzymes: No results for input(s): CKTOTAL, CKMB, CKMBINDEX, TROPONINI in the last 168 hours.  HbA1C: Hemoglobin A1C  Date/Time Value Ref Range Status  10/21/2011 12:23 PM 6.5  Final  07/03/2011 02:15 PM 6.5  Final   Hgb A1c MFr Bld  Date/Time Value Ref Range Status  10/20/2017 02:29 PM 7.2 (H) 4.8 - 5.6 % Final    Comment:    (NOTE)         Prediabetes: 5.7 - 6.4         Diabetes: >6.4         Glycemic control for adults with diabetes: <7.0     CBG: No results for input(s): GLUCAP in the last 168 hours.  Review of Systems:   Bolds are positive  Constitutional: weight loss, gain, night sweats, Fevers, chills, fatigue .  HEENT: headaches, Sore throat, sneezing, nasal congestion, post nasal drip, Difficulty swallowing, Tooth/dental problems, visual complaints visual changes, ear ache CV:  chest pain, radiates:,Orthopnea, PND, swelling in lower extremities, dizziness, palpitations, syncope.  GI  heartburn, indigestion, abdominal pain, nausea, vomiting, diarrhea, change in bowel habits, loss of appetite, bloody stools.  Resp: cough, productive:, hemoptysis, dyspnea, chest pain, pleuritic.  Skin: rash or itching or icterus GU: dysuria, change in color of urine, urgency or frequency. flank pain, hematuria  MS: joint pain or swelling. decreased range of motion  Psych: change in mood or affect. depression or anxiety.  Neuro: difficulty with speech, weakness, numbness, ataxia    Past Medical History  She,   has a past medical history of Diabetes mellitus, Down syndrome, Family history of adverse reaction to anesthesia, Gastritis, GERD (gastroesophageal reflux disease), Headache, Hepatic steatosis, Hidradenitis, Hypothyroidism, Irritable bowel syndrome, Periumbilical hernia, Pneumonia, Restless legs, Tendonitis, and Thyroid disease.   Surgical History    Past Surgical History:  Procedure Laterality Date  . AXILLARY HIDRADENITIS EXCISION Bilateral   . CHOLECYSTECTOMY    . HERNIA REPAIR     multiple  . INCISIONAL HERNIA REPAIR  05/30/2015   LAPROSCOPIC  . INCISIONAL HERNIA REPAIR N/A 05/30/2015   Procedure: LAPAROSCOPIC INCISIONAL HERNIA;  Surgeon: Rolm Bookbinder, MD;  Location: [place];  Service: General;  Laterality: N/A;  . INSERTION OF MESH N/A 05/30/2015   Procedure: INSERTION OF MESH;  Surgeon: Rolm Bookbinder, MD;  Location: [place];  Service: General;  Laterality: N/A;  . KNEE ARTHROSCOPY     right knee  . LAPAROSCOPY N/A 05/30/2015   Procedure: LAPAROSCOPY DIAGNOSTIC;  Surgeon: Rolm Bookbinder, MD;  Location: [place];  Service: General;  Laterality: N/A;  . PARTIAL KNEE ARTHROPLASTY Right 10/30/2017   Procedure: UNICOMPARTMENTAL RIGHT KNEE LATERAL;  Surgeon: Paralee Cancel, MD;  Location: [facility];  Service: Orthopedics;  Laterality: Right;  90 mins  . TONSILLECTOMY     adenoids     Social History   reports that she has never smoked. She has never used smokeless tobacco. She reports that she does not drink alcohol or use drugs.   Family History   Her family history includes Diabetes type II in her father and mother; Heart disease in her mother; High Cholesterol in her mother; Hypertension in her father; Stroke in her father; Thyroid cancer in her mother.   Allergies Allergies  Allergen Reactions  . Vancomycin Itching  . Cefuroxime Hives and Swelling    Unspecified location of swelling  . Cefuroxime Axetil Hives  . Sulfa Antibiotics Hives     Home Medications  Prior to Admission  medications   Medication Sig Start Date End Date Taking? Authorizing Provider  acetaminophen (TYLENOL) 325 MG tablet Take 650 mg by mouth every 6 (six) hours as needed for mild pain.    Yes [provider]  allopurinol (ZYLOPRIM) 300 MG tablet Take 1 tablet (300 mg total) by mouth daily. 07/07/18  Yes Copland, Gay Filler, MD  Blood Glucose Monitoring Suppl (BLOOD GLUCOSE METER KIT AND SUPPLIES) Dispense based on patient and insurance preference. To check blood sugars daily FOR ICD-9 250.00 Patient taking differently: 1 each by Other route See admin instructions. Dispense based on patient and insurance preference. To check blood sugars daily FOR ICD-9 250.00 08/31/13  Yes Daub, Loura Back, MD  cetirizine (ZYRTEC) 10 MG tablet Take 10 mg by mouth daily as needed for allergies.    Yes [provider]  cholestyramine (QUESTRAN) 4 g packet DISSOLVE & TAKE 1 POWDER BY MOUTH TWICE DAILY Patient taking differently: Take 4 g by mouth 2 (two) times daily.  09/25/18  Yes Zehr, Laban Emperor, PA-C  clobetasol cream (TEMOVATE) 6.71 % Apply 1 application topically daily as needed (for eczema).  10/07/12  Yes [provider]  dicyclomine (BENTYL) 20 MG tablet Take 1 tablet (20 mg total) by mouth 4 (four) times daily -  before meals and at bedtime. Patient taking differently: Take 20 mg by mouth 2 (two) times a day.  04/25/17  Yes Copland, Gay Filler, MD  empagliflozin (JARDIANCE) 25 MG TABS tablet Take 25 mg by mouth daily.   Yes [provider]  Glucose Blood (BLOOD GLUCOSE TEST STRIPS) STRP Use to test blood sugar up to twice a day Patient taking differently: 1 each by Other route See admin instructions. Use to test blood sugar up to twice a day 07/07/18  Yes Copland, Gay Filler, MD  JOLESSA 0.15-0.03 MG tablet Take 1 tablet by mouth once daily 02/02/19  Yes Copland, Gay Filler, MD  levothyroxine (SYNTHROID) 137 MCG tablet TAKE 1 TABLET BY MOUTH IN THE MORNING . APPOINTMENT REQUIRED FOR FUTURE  REFILLS Patient taking differently: Take 137 mcg by mouth every evening.  02/08/19  Yes Copland, Gay Filler, MD  metFORMIN (GLUCOPHAGE) 1000 MG tablet Take 1,000 mg by mouth 2 (two) times daily.  01/16/16  Yes [provider]  naproxen sodium (ALEVE) 220 MG tablet Take 220 mg by mouth daily as needed (pain).    Yes [provider]  nystatin (MYCOSTATIN/NYSTOP) powder Apply 1 application topically 3 (three) times daily. Use as needed for candida intertrigo Patient taking differently: Apply 1 application topically 3 (three) times daily as needed (Use as needed for candida intertrigo).  04/21/19  Yes Copland, Gay Filler, MD  ondansetron (ZOFRAN) 4 MG tablet  Take 4 mg by mouth every 8 (eight) hours as needed for nausea/vomiting. 05/20/19  Yes [provider]  ONE TOUCH ULTRA TEST test strip USE TO CHECK BLOOD SUGAR ONCE DAILY (ICD CODE:25.00) Patient taking differently: 1 each by Other route daily.  05/08/13  Yes Weber, Sarah L, PA-C  oxyCODONE (OXY IR/ROXICODONE) 5 MG immediate release tablet Take 5 mg by mouth every 4 (four) hours as needed for pain.   Yes [provider]  Semaglutide,0.25 or 0.5MG/DOS, (OZEMPIC, 0.25 OR 0.5 MG/DOSE,) 2 MG/1.5ML SOPN Inject 0.5 mg into the skin once a week. Saturdays   Yes [provider]  DULoxetine (CYMBALTA) 30 MG capsule Take 1 capsule (30 mg total) by mouth daily. Patient not taking: Reported on 05/20/2019 03/09/19   Silverio Decamp, MD  fluconazole (DIFLUCAN) 50 MG tablet Take 1 tablet (50 mg total) by mouth daily. Patient not taking: Reported on 05/20/2019 04/21/19   Copland, Gay Filler, MD  itraconazole (SPORANOX) 100 MG capsule Take 2 capsules (200 mg total) by mouth daily. Take for 7 days Patient not taking: Reported on 05/20/2019 05/10/19   Copland, Gay Filler, MD     Critical care time: 35 mins     Georgann Housekeeper, AGACNP-BC [place] for personal pager PCCM on call pager [phone]  05/20/2019 10:46 PM

## 2019-05-21 ENCOUNTER — Inpatient Hospital Stay (HOSPITAL_COMMUNITY): Payer: Medicare HMO

## 2019-05-21 ENCOUNTER — Encounter (HOSPITAL_COMMUNITY): Payer: Self-pay | Admitting: Internal Medicine

## 2019-05-21 DIAGNOSIS — J9601 Acute respiratory failure with hypoxia: Secondary | ICD-10-CM

## 2019-05-21 LAB — BASIC METABOLIC PANEL
Anion gap: 13 (ref 5–15)
BUN: 11 mg/dL (ref 6–20)
CO2: 21 mmol/L — ABNORMAL LOW (ref 22–32)
Calcium: 8 mg/dL — ABNORMAL LOW (ref 8.9–10.3)
Chloride: 106 mmol/L (ref 98–111)
Creatinine, Ser: 0.69 mg/dL (ref 0.44–1.00)
GFR calc Af Amer: >60 mL/min (ref >60)
GFR calc non Af Amer: >60 mL/min (ref >60)
Glucose, Bld: 181 mg/dL — ABNORMAL HIGH (ref 70–99)
Potassium: 4.4 mmol/L (ref 3.5–5.1)
Sodium: 140 mmol/L (ref 135–145)

## 2019-05-21 LAB — CBC
HCT: 39 % (ref 36.0–46.0)
Hemoglobin: 11.8 g/dL — ABNORMAL LOW (ref 12.0–15.0)
MCH: 29 pg (ref 26.0–34.0)
MCHC: 30.3 g/dL (ref 30.0–36.0)
MCV: 95.8 fL (ref 80.0–100.0)
Platelets: 258 10*3/uL (ref 150–400)
RBC: 4.07 MIL/uL (ref 3.87–5.11)
RDW: 17.1 % — ABNORMAL HIGH (ref 11.5–15.5)
WBC: 11.2 10*3/uL — ABNORMAL HIGH (ref 4.0–10.5)
nRBC: 0 % (ref 0.0–0.2)

## 2019-05-21 LAB — HIV ANTIBODY (ROUTINE TESTING W REFLEX): HIV Screen 4th Generation wRfx: NONREACTIVE

## 2019-05-21 LAB — GLUCOSE, CAPILLARY
Glucose-Capillary: 220 mg/dL — ABNORMAL HIGH (ref 70–99)
Glucose-Capillary: 238 mg/dL — ABNORMAL HIGH (ref 70–99)
Glucose-Capillary: 211 mg/dL — ABNORMAL HIGH (ref 70–99)

## 2019-05-21 LAB — MAGNESIUM: Magnesium: 1.7 mg/dL (ref 1.7–2.4)

## 2019-05-21 LAB — MRSA PCR SCREENING: MRSA by PCR: NEGATIVE

## 2019-05-21 LAB — SARS CORONAVIRUS 2 (TAT 6-24 HRS): SARS Coronavirus 2: NEGATIVE

## 2019-05-21 LAB — PHOSPHORUS: Phosphorus: 3.3 mg/dL (ref 2.5–4.6)

## 2019-05-21 MED ORDER — WHITE PETROLATUM EX OINT
TOPICAL_OINTMENT | CUTANEOUS | Status: AC
  Administered 2019-05-21: 1
  Filled 2019-05-21: qty 28.35

## 2019-05-21 MED ORDER — ORAL CARE MOUTH RINSE
15.0000 mL | Freq: Two times a day (BID) | OROMUCOSAL | Status: DC
  Administered 2019-05-21 – 2019-05-22 (×4): 15 mL via OROMUCOSAL

## 2019-05-21 MED ORDER — INSULIN ASPART 100 UNIT/ML ~~LOC~~ SOLN
0.0000 [IU] | Freq: Three times a day (TID) | SUBCUTANEOUS | Status: DC
  Administered 2019-05-21 (×2): 5 [IU] via SUBCUTANEOUS
  Administered 2019-05-22: 09:00:00 3 [IU] via SUBCUTANEOUS

## 2019-05-21 MED ORDER — LEVOTHYROXINE SODIUM 25 MCG PO TABS: 137.0000 ug | ORAL_TABLET | Freq: Every day | ORAL | Status: DC

## 2019-05-21 MED ORDER — ONDANSETRON HCL 4 MG/2ML IJ SOLN
4.0000 mg | Freq: Three times a day (TID) | INTRAMUSCULAR | Status: DC | PRN
  Administered 2019-05-21 – 2019-05-23 (×5): 4 mg via INTRAVENOUS
  Filled 2019-05-21 (×5): qty 2

## 2019-05-21 MED ORDER — LEVOTHYROXINE SODIUM 25 MCG PO TABS
137.0000 ug | ORAL_TABLET | Freq: Every day | ORAL | Status: DC
  Administered 2019-05-21: 18:00:00 137 ug via ORAL
  Filled 2019-05-21: qty 1

## 2019-05-21 MED ORDER — CHLORHEXIDINE GLUCONATE CLOTH 2 % EX PADS
6.0000 | MEDICATED_PAD | Freq: Every day | CUTANEOUS | Status: DC
  Administered 2019-05-21 – 2019-05-22 (×2): 6 via TOPICAL

## 2019-05-21 MED ORDER — CHLORHEXIDINE GLUCONATE 0.12 % MT SOLN
15.0000 mL | Freq: Two times a day (BID) | OROMUCOSAL | Status: DC
  Administered 2019-05-21 – 2019-05-23 (×6): 15 mL via OROMUCOSAL
  Filled 2019-05-21 (×3): qty 15

## 2019-05-21 MED ORDER — FENTANYL CITRATE (PF) 100 MCG/2ML IJ SOLN
12.5000 ug | INTRAMUSCULAR | Status: DC | PRN
  Administered 2019-05-21: 09:00:00 25 ug via INTRAVENOUS
  Administered 2019-05-21: 01:00:00 12.5 ug via INTRAVENOUS
  Administered 2019-05-21: 50 ug via INTRAVENOUS
  Administered 2019-05-21 – 2019-05-22 (×4): 25 ug via INTRAVENOUS
  Administered 2019-05-22 (×2): 50 ug via INTRAVENOUS
  Filled 2019-05-21 (×8): qty 2

## 2019-05-21 MED ORDER — CHOLESTYRAMINE 4 G PO PACK
4.0000 g | PACK | Freq: Two times a day (BID) | ORAL | Status: DC
  Administered 2019-05-21 – 2019-05-23 (×5): 4 g via ORAL
  Filled 2019-05-21 (×5): qty 1

## 2019-05-21 NOTE — Progress Notes (Signed)
All reviewed. She seems to be improving.Really appreciate everyones help . Ortho no changes. May move arm as much as she wants . Sling for comfort. Can change dresiing daily and may shower if desired tomorrow. She has appointment to follow up in office in 2 weeks. Call for any further help needed.

## 2019-05-21 NOTE — Progress Notes (Signed)
eLink Physician-Brief Progress Note Patient Name: Wanda Oneill DOB: 1980-08-19 MRN: [id number]   Date of Service  05/21/2019  HPI/Events of Note  28F with hx of Down Syndrome, T2DM and obesity who presented with acute hypoxemic respiratory failure after an outpatient orthopedic procedure involving a scalene block. CXR showed R hemidiaphragm elevation which anesthesia believes is paralysis related to the interscalene block. It is also possible there could be some volume loss from an aspiration event with secondary airway plugging.  Bedside team saw the patient in the ED and decided to try BiPAP to improve her work of breathing. She arrives to the ICU for monitoring of her respiratory status.  BiPAP: 12/6, 60%, breathing 40 times per minute. Volumes are mid-400s.  Changed these settings in the room to 16/4, 60% to provide increased driving pressure. This resulted in her RR decreasing from 40 to 30.   eICU Interventions  # Neuro: - Fentanyl 12.5-50 mcg IV Q2H PRN for pain secondary to shoulder surgery. Pre-medicate with Zofran given history that tramadol gives patient nausea.  # RESP: - BiPAP for now on settings as above. HFNC if not tolerated or when doing oral care, etc. - Await recovery of right hemidiaphragm function. - Empiric treatment for possible aspiration pneumonia with antibiotics as below. - Maintain HOB elevation. - Repeat CXR in AM.  # Cardiac: - NAI  # ID: - Levofloxacin for now as above. Can revisit necessity of this in AM.  # GI: - NPO for now given respiratory failure.  # Endocrine: - Insulin sliding scale - Home diabetes medications on hold - Continue synthroid when able to take PO  # Renal: - LR @ 50cc/hr while unable to take PO due to respiratory status.  DVT PPX: [place] Heparin GI PPX: Not indicated. Code Status: Full     Intervention Category Evaluation Type: New Patient Evaluation  Charlott Rakes 05/21/2019, 12:31 AM

## 2019-05-21 NOTE — Progress Notes (Signed)
Pts order for BIPAP is PRN; pt not in need of at this time. Pt respiratory status is stable at this time on HFNC [place] 6 Lpm . RT will continue to monitor.

## 2019-05-21 NOTE — Progress Notes (Addendum)
Physical Therapy Evaluation Patient Details Name: Wanda Oneill MRN: [id number] DOB: 05-Feb-1981 Today's Date: 05/21/2019   History of Present Illness  39 y.o. female admitted on 05/20/19 after elective R shoulder arthroscopy and SAD with difficulty breathing post op most likely from interscalene block that partially paralyzed her right hemidiaphram.  She was admitted to [facility] from outpatient surgery center of [facility] for further treatment requiring BiPap to maintain sats.  At time of PT eval she is on 6 L O2 HFNC. Pt with significant PMH of periumbilical hernia, down syndrome, DM, R partial knee arthroplasty, hernia repair.    Clinical Impression  Pt tolerated gait well around the unit with VSS throughout on 6 L O2 HFNC.  Pt would benefit from continued therapy acutely to help with activity progression and O2 weaning.  I did ask for OT evaluation to help address good activities for the post op shoulder and ADLs.   PT to follow acutely for deficits listed below.      Follow Up Recommendations No PT follow up;Follow surgeon's recommendation for DC plan and follow-up therapies    Equipment Recommendations  None recommended by PT    Recommendations for Other Services OT consult     Precautions / Restrictions Precautions Precautions: Fall Precaution Comments: per pt report h/o falls due to R knee dysfunction Required Braces or Orthoses: Sling(for comfort) Restrictions Weight Bearing Restrictions: No Other Position/Activity Restrictions: ROM at tolerated per ortho note, no orders or indications of WB restrictions.       Mobility  Bed Mobility Overal bed mobility: Needs Assistance Bed Mobility: Sit to Supine       Sit to supine: Min assist   General bed mobility comments: Min assist to help lift one leg back into bed to return to supine.   Transfers Overall transfer level: Needs assistance Equipment used: 1 person hand held assist Transfers: Sit to/from Stand Sit to  Stand: Min assist         General transfer comment: Min assist to help power up and stabilize, cues for safe hand placement.   Ambulation/Gait Ambulation/Gait assistance: Min assist Gait Distance (Feet): 75 Feet(x2 with seated rest due to lightheadedness) Assistive device: 1 person hand held assist Gait Pattern/deviations: Step-through pattern;Antalgic Gait velocity: decreased Gait velocity interpretation: 1.31 - 2.62 ft/sec, indicative of limited community ambulator General Gait Details: R knee antalgic gait pattern, but she reports this is her baseline.  Also reports her parents can provide physical assist at d/c         Balance Overall balance assessment: Needs assistance Sitting-balance support: Feet supported;Single extremity supported Sitting balance-Leahy Scale: Good Sitting balance - Comments: supervision EOB.    Standing balance support: No upper extremity supported Standing balance-Leahy Scale: Good Standing balance comment: close supervision in standing while pt assisted in her own bathing.                              Pertinent Vitals/Pain Pain Assessment: Faces Faces Pain Scale: Hurts little more Pain Location: R shoulder Pain Descriptors / Indicators: Aching Pain Intervention(s): Limited activity within patient's tolerance;Monitored during session;Repositioned;Ice applied    Home Living Family/patient expects to be discharged to:: Private residence Living Arrangements: Alone Available Help at Discharge: Family;Available 24 hours/day(going to stay with her parents) Type of Home: House       Home Layout: Other (Comment)(going to stay on the couch at her parent's house) Home Equipment: Gilford Rile - 2 wheels  Prior Function Level of Independence: Independent;Needs assistance   Gait / Transfers Assistance Needed: Pt walks without AD normally, but does have h/o falls due to baseline R knee dysfunction.  Always has someone help her if she has to  do the stairs.      Comments: rides [employer], goes to some kind of "school"/learning center     Hand Dominance   Dominant Hand: Right    Extremity/Trunk Assessment   Upper Extremity Assessment Upper Extremity Assessment: Defer to OT evaluation    Lower Extremity Assessment Lower Extremity Assessment: RLE deficits/detail RLE Deficits / Details: right leg at baseline with dysfunction/weakness.  Antalgic gait pattern is her normal. She reports she has had therapy on it and actually fell at her PT.    Cervical / Trunk Assessment Cervical / Trunk Assessment: Normal  Communication   Communication: No difficulties  Cognition Arousal/Alertness: Awake/alert Behavior During Therapy: WFL for tasks assessed/performed Overall Cognitive Status: History of cognitive impairments - at baseline                                 General Comments: Down Syndrome at baseline, likely currently at her baseline.      General Comments General comments (skin integrity, edema, etc.): Pt maintains O2 sats on 6L O2 [place] throughout gait, walking and talking.         Assessment/Plan    PT Assessment Patient needs continued PT services  PT Problem List Decreased balance;Decreased strength;Decreased activity tolerance;Decreased mobility;Decreased knowledge of use of DME;Cardiopulmonary status limiting activity;Obesity;Pain       PT Treatment Interventions DME instruction;Stair training;Gait training;Functional mobility training;Therapeutic activities;Therapeutic exercise;Balance training;Patient/family education    PT Goals (Current goals can be found in the Care Plan section)  Acute Rehab PT Goals Patient Stated Goal: to get better and go home with her parents PT Goal Formulation: With patient Time For Goal Achievement: 06/04/19 Potential to Achieve Goals: Good    Frequency Min 5X/week           AM-PAC PT "6 Clicks" Mobility  Outcome Measure Help needed turning from your back to  your side while in a flat bed without using bedrails?: A Little Help needed moving from lying on your back to sitting on the side of a flat bed without using bedrails?: A Little Help needed moving to and from a bed to a chair (including a wheelchair)?: A Little Help needed standing up from a chair using your arms (e.g., wheelchair or bedside chair)?: A Little Help needed to walk in hospital room?: A Little Help needed climbing 3-5 steps with a railing? : A Little 6 Click Score: 18    End of Session Equipment Utilized During Treatment: Other (comment)(R arm sling) Activity Tolerance: Patient limited by pain Patient left: in bed;with call bell/phone within reach Nurse Communication: Mobility status PT Visit Diagnosis: Muscle weakness (generalized) (M62.81);Difficulty in walking, not elsewhere classified (R26.2);Pain Pain - Right/Left: Right Pain - part of body: Shoulder    Time: [id number] PT Time Calculation (min) (ACUTE ONLY): 39 min   Charges:         Verdene Lennert, PT, DPT  Acute Rehabilitation [phone] pager #[phone] office     PT Evaluation $PT Eval Moderate Complexity: 1 Mod PT Treatments $Gait Training: 8-22 mins $Therapeutic Activity: 8-22 mins        05/21/2019, 3:27 PM

## 2019-05-21 NOTE — Progress Notes (Addendum)
NAME:  Wanda Oneill, MRN:  [id number], DOB:  12-11-1980, LOS: 1 ADMISSION DATE:  05/20/2019, CONSULTATION DATE:  4/15 REFERRING MD:  Dr Melina Copa, CHIEF COMPLAINT:  Respiratory distress  Brief History   39 year old female presenting with respiratory failure from surgery center after elective shoulder surgery under interscalene block. Imaging on admission showed elevated R hemidiaphragm. Started on BiPAP.   History of present illness   39 year old female with PMH as below, which is significant for diabetes mellitus and Down Syndrome. She is status post both doses of COVID-19 vaccine. She presented to outpatient surgery center on 4/15 for elective bone spur shaving of the R shoulder. Surgery done under interscalene block. Her father is here at bedside (He is a [place]) and tells me that from the moment the block was done he began noticing desaturations. Surgery was without complication. In the post-operative setting, however, she was dyspneic with new O2 requirement. This prompted transfer to [facility] ED, where she was requiring 15L NRB to maintain sats in the 90s. Chest radiograph concerning for elevated R hemidiaphragm. Anesthesia was consulted by ED, and they feel this is a likely consequence of the interscalene block. PCCM was consulted for admission.   Past Medical History   has a past medical history of Diabetes mellitus, Down syndrome, Family history of adverse reaction to anesthesia, Gastritis, GERD (gastroesophageal reflux disease), Headache, Hepatic steatosis, Hidradenitis, Hypothyroidism, Irritable bowel syndrome, Periumbilical hernia, Pneumonia, Restless legs, Tendonitis, and Thyroid disease.  Significant Hospital Events   4/16: elective shoulder surgery under interscalene block.>> Phrenic Nerve  paralysis from interscalene block vs RML collapse   Consults:    Procedures:    Significant Diagnostic Tests:  4/16 CXR Bilateral lower lung atelectatic  change. No frank consolidation. Stable cardiac prominence. Stable elevation right hemidiaphragm.  Micro Data:  Blood 4/15 >  Antimicrobials:  Levofloxacin 4/15 >  Interim history/subjective:  Overnight was on BiPAP. On 10 L HFNC this am Awake and alert Net negative 284 States shoulder pain is manageable T Max 99.2. WBC 11.2 CXR 4/16>> Bilateral lower lung atelectatic change. No frank consolidation. Stable cardiac prominence. Stable elevation right hemidiaphragm.  Objective   Blood pressure 130/80, pulse 87, temperature 99.3 F (37.4 C), temperature source Axillary, resp. rate (!) 30, height '5\' 4"'$  (1.626 m), weight 116.9 kg, SpO2 95 %.    FiO2 (%):  [60 %] 60 %   Intake/Output Summary (Last 24 hours) at 05/21/2019 0823 Last data filed at 05/21/2019 0700 Gross per 24 hour  Intake 365.7 ml  Output 700 ml  Net -334.3 ml   Filed Weights   05/21/19 0030 05/21/19 0224  Weight: 116.9 kg 116.9 kg    Examination: General: Obese adult female in mild/moderate respiratory distress, on 8 L HFNC HENT: /AT, PERRL, no JVD Lungs: Bilateral Chest excursion, RR of 27, diminished per bases bilaterally R > L  Cardiovascular: S1, S2, RRR, no MRG Abdomen: Soft, non-tender, non-distended, Obese Extremities: No acute deformity or ROM limitation, Right shoulder dressing is clean dry and intact Neuro: Awake, alert, oriented, non-focal.   Resolved Hospital Problem list     Assessment & Plan:   Acute hypoxemic respiratory failure: likely secondary to paralyzed right hemidiaphragm >> collapsed RML as a possible complication of interscalene block. Cannot rule out aspiration into the right lower lobe causing atelectasis.  - To remain in ICU today for aggressive Pulmonary Toilet  - Work on weaning HFNC today to sat goal noted  below - Aggressive Pulmonary Toilet, IS and Flutter valve, OOB to chair, ambulate in hall - Will consult PT - SpO2 goal > 92% - Continue Levofloxacin for aspiration  coverage. - Repeat imaging 4/17  Leukocytosis WBC 11.2 T Max last 24 hours  99.3 Plan Trend Fever Curve and WBC Culture as is clinically indicated Follow micro>> pending 4/16  DM - CBG monitoring  AC and HS - Will hold metformin, semaglutide,  and empagliflozin until taking PO more reliably - Will add SSI AC and HS as we will start diet today, and blood sugars are elevated  Hypothyroidism - Continue home synthroid  Chronic abdominal pain: Ventral hernia - Continue home Bentyl   Nutrition Plan Will start  Heart Healthy Carb Modified Diet Will add Questran BID at patient's request for her IBS  Will need ambulatory oxygen sat test prior to DC. May need to go home on oxygen. Spoke with Dr. Linna Darner who said she did have to go home with oxygen after a previous surgery. She was tested for OSA several years ago. She does not snore. Per Dr. Linna Darner she may need to be retested   Best practice:  Diet: Heart Healthy Carb Modified Diet Pain/Anxiety/Delirium protocol (if indicated):  VAP protocol (if indicated): NA DVT prophylaxis: AQH GI prophylaxis: NA Glucose control: SSI Mobility: BR Code Status: ICU Family Communication:Dr. Linna Darner and [name] updated by phone 4/16 Disposition: ICU  Labs   CBC: Recent Labs  Lab 05/20/19 1813 05/20/19 2032 05/21/19 0610  WBC 13.1*  --  11.2*  NEUTROABS 11.4*  --   --   HGB 13.5 14.3 11.8*  HCT 44.8 42.0 39.0  MCV 96.3  --  95.8  PLT 322  --  425    Basic Metabolic Panel: Recent Labs  Lab 05/20/19 1813 05/20/19 2032 05/21/19 0610  NA 137 138 140  K 6.2* 4.9 4.4  CL 105 107 106  CO2 17*  --  21*  GLUCOSE 248* 229* 181*  BUN _0 CREATININE 0.93 0.60 0.69  CALCIUM 8.0*  --  8.0*  MG  --   --  1.7  PHOS  --   --  3.3   GFR: Estimated Creatinine Clearance: 118.6 mL/min (by C-G formula based on SCr of 0.69 mg/dL). Recent Labs  Lab 05/20/19 1813 05/21/19 0610  WBC 13.1* 11.2*    Liver Function Tests: No  results for input(s): AST, ALT, ALKPHOS, BILITOT, PROT, ALBUMIN in the last 168 hours. No results for input(s): LIPASE, AMYLASE in the last 168 hours. No results for input(s): AMMONIA in the last 168 hours.  ABG    Component Value Date/Time   TCO2 22 05/20/2019 2032     Coagulation Profile: No results for input(s): INR, PROTIME in the last 168 hours.  Cardiac Enzymes: No results for input(s): CKTOTAL, CKMB, CKMBINDEX, TROPONINI in the last 168 hours.  HbA1C: Hemoglobin A1C  Date/Time Value Ref Range Status  10/21/2011 12:23 PM 6.5  Final  07/03/2011 02:15 PM 6.5  Final   Hgb A1c MFr Bld  Date/Time Value Ref Range Status  10/20/2017 02:29 PM 7.2 (H) 4.8 - 5.6 % Final    Comment:    (NOTE)         Prediabetes: 5.7 - 6.4         Diabetes: >6.4         Glycemic control for adults with diabetes: <7.0     CBG: No results for input(s): GLUCAP in the last 168  hours.   Past Medical History  She,  has a past medical history of Diabetes mellitus, Down syndrome, Family history of adverse reaction to anesthesia, Gastritis, GERD (gastroesophageal reflux disease), Headache, Hepatic steatosis, Hidradenitis, Hypothyroidism, Irritable bowel syndrome, Periumbilical hernia, Pneumonia, Restless legs, Tendonitis, and Thyroid disease.   Surgical History    Past Surgical History:  Procedure Laterality Date  . AXILLARY HIDRADENITIS EXCISION Bilateral   . CHOLECYSTECTOMY    . HERNIA REPAIR     multiple  . INCISIONAL HERNIA REPAIR  05/30/2015   LAPROSCOPIC  . INCISIONAL HERNIA REPAIR N/A 05/30/2015   Procedure: LAPAROSCOPIC INCISIONAL HERNIA;  Surgeon: Rolm Bookbinder, MD;  Location: [facility];  Service: General;  Laterality: N/A;  . INSERTION OF MESH N/A 05/30/2015   Procedure: INSERTION OF MESH;  Surgeon: Rolm Bookbinder, MD;  Location: [place];  Service: General;  Laterality: N/A;  . KNEE ARTHROSCOPY     right knee  . LAPAROSCOPY N/A 05/30/2015   Procedure: LAPAROSCOPY DIAGNOSTIC;   Surgeon: Rolm Bookbinder, MD;  Location: [place];  Service: General;  Laterality: N/A;  . PARTIAL KNEE ARTHROPLASTY Right 10/30/2017   Procedure: UNICOMPARTMENTAL RIGHT KNEE LATERAL;  Surgeon: Paralee Cancel, MD;  Location: [facility];  Service: Orthopedics;  Laterality: Right;  90 mins  . TONSILLECTOMY     adenoids     Social History   reports that she has never smoked. She has never used smokeless tobacco. She reports that she does not drink alcohol or use drugs.   Family History   Her family history includes Diabetes type II in her father and mother; Heart disease in her mother; High Cholesterol in her mother; Hypertension in her father; Stroke in her father; Thyroid cancer in her mother.   Allergies Allergies  Allergen Reactions  . Vancomycin Itching  . Cefuroxime Hives and Swelling    Unspecified location of swelling  . Cefuroxime Axetil Hives  . Sulfa Antibiotics Hives     Home Medications  Prior to Admission medications   Medication Sig Start Date End Date Taking? Authorizing Provider  acetaminophen (TYLENOL) 325 MG tablet Take 650 mg by mouth every 6 (six) hours as needed for mild pain.    Yes [provider]  allopurinol (ZYLOPRIM) 300 MG tablet Take 1 tablet (300 mg total) by mouth daily. 07/07/18  Yes Copland, Gay Filler, MD  Blood Glucose Monitoring Suppl (BLOOD GLUCOSE METER KIT AND SUPPLIES) Dispense based on patient and insurance preference. To check blood sugars daily FOR ICD-9 250.00 Patient taking differently: 1 each by Other route See admin instructions. Dispense based on patient and insurance preference. To check blood sugars daily FOR ICD-9 250.00 08/31/13  Yes Daub, Loura Back, MD  cetirizine (ZYRTEC) 10 MG tablet Take 10 mg by mouth daily as needed for allergies.    Yes [provider]  cholestyramine (QUESTRAN) 4 g packet DISSOLVE & TAKE 1 POWDER BY MOUTH TWICE DAILY Patient taking differently: Take 4 g by mouth 2 (two) times daily.  09/25/18  Yes Zehr,  Laban Emperor, PA-C  clobetasol cream (TEMOVATE) 9.03 % Apply 1 application topically daily as needed (for eczema).  10/07/12  Yes [provider]  dicyclomine (BENTYL) 20 MG tablet Take 1 tablet (20 mg total) by mouth 4 (four) times daily -  before meals and at bedtime. Patient taking differently: Take 20 mg by mouth 2 (two) times a day.  04/25/17  Yes Copland, Gay Filler, MD  empagliflozin (JARDIANCE) 25 MG TABS tablet Take 25 mg by mouth  daily.   Yes [provider]  Glucose Blood (BLOOD GLUCOSE TEST STRIPS) STRP Use to test blood sugar up to twice a day Patient taking differently: 1 each by Other route See admin instructions. Use to test blood sugar up to twice a day 07/07/18  Yes Copland, Gay Filler, MD  JOLESSA 0.15-0.03 MG tablet Take 1 tablet by mouth once daily 02/02/19  Yes Copland, Gay Filler, MD  levothyroxine (SYNTHROID) 137 MCG tablet TAKE 1 TABLET BY MOUTH IN THE MORNING . APPOINTMENT REQUIRED FOR FUTURE REFILLS Patient taking differently: Take 137 mcg by mouth every evening.  02/08/19  Yes Copland, Gay Filler, MD  metFORMIN (GLUCOPHAGE) 1000 MG tablet Take 1,000 mg by mouth 2 (two) times daily.  01/16/16  Yes [provider]  naproxen sodium (ALEVE) 220 MG tablet Take 220 mg by mouth daily as needed (pain).    Yes [provider]  nystatin (MYCOSTATIN/NYSTOP) powder Apply 1 application topically 3 (three) times daily. Use as needed for candida intertrigo Patient taking differently: Apply 1 application topically 3 (three) times daily as needed (Use as needed for candida intertrigo).  04/21/19  Yes Copland, Gay Filler, MD  ondansetron (ZOFRAN) 4 MG tablet Take 4 mg by mouth every 8 (eight) hours as needed for nausea/vomiting. 05/20/19  Yes [provider]  ONE TOUCH ULTRA TEST test strip USE TO CHECK BLOOD SUGAR ONCE DAILY (ICD CODE:25.00) Patient taking differently: 1 each by Other route daily.  05/08/13  Yes Weber, Lamyah Creed L, PA-C  oxyCODONE (OXY IR/ROXICODONE)  5 MG immediate release tablet Take 5 mg by mouth every 4 (four) hours as needed for pain.   Yes [provider]  Semaglutide,0.25 or 0.5MG/DOS, (OZEMPIC, 0.25 OR 0.5 MG/DOSE,) 2 MG/1.5ML SOPN Inject 0.5 mg into the skin once a week. Saturdays   Yes [provider]  DULoxetine (CYMBALTA) 30 MG capsule Take 1 capsule (30 mg total) by mouth daily. Patient not taking: Reported on 05/20/2019 03/09/19   Silverio Decamp, MD  fluconazole (DIFLUCAN) 50 MG tablet Take 1 tablet (50 mg total) by mouth daily. Patient not taking: Reported on 05/20/2019 04/21/19   Copland, Gay Filler, MD  itraconazole (SPORANOX) 100 MG capsule Take 2 capsules (200 mg total) by mouth daily. Take for 7 days Patient not taking: Reported on 05/20/2019 05/10/19   Copland, Gay Filler, MD     Critical care time: 85 mins     Magdalen Spatz, MSN, AGACNP-BC [place] Pager # [phone] After 4 pm please call [phone] 05/21/2019 8:23 AM

## 2019-05-21 NOTE — Plan of Care (Signed)

## 2019-05-21 NOTE — Progress Notes (Signed)
Patient's parents asked if birth control pill had been given as they are concerned about her starting a cycle due to missing pills. I contacted the pharmacy and her brand is not stocked here, mother to bring in home meds.

## 2019-05-21 NOTE — Progress Notes (Signed)
Subjective:   Patient is admitted for acute hypoxemic respiratory failure. We did surgery on patient yesterday at [facility] we did a right shoulder arthroscopy and SAD. Patient was having difficulty with breathing after surgery yesterday,  Most likely due to interscalene block that partially paralyzed her right hemidiaphragm. She was going to stay overnight at the surgical center but starting stating down into the 80s so she was transferred over to [facility] for treatment.  Patient reports pain as moderate in her right shoulder. Reports breathing is improving. Off BIPAP machine when I am in speaking with her.   Objective: Vital signs in last 24 hours: Temp:  [98.3 F (36.8 C)-99.3 F (37.4 C)] 99.3 F (37.4 C) (04/16 0400) Pulse Rate:  [42-110] 84 (04/16 0600) Resp:  [18-44] 26 (04/16 0600) BP: (100-141)/(77-104) 121/86 (04/16 0600) SpO2:  [91 %-97 %] 95 % (04/16 0600) FiO2 (%):  [60 %] 60 % (04/16 0400) Weight:  [116.9 kg] 116.9 kg (04/16 0224)  Intake/Output from previous day: 04/15 0701 - 04/16 0700 In: 318.6 [I.V.:168.5; IV Piggyback:150.1] Out: 700 [Urine:700] Intake/Output this shift: Total I/O In: 318.6 [I.V.:168.5; IV Piggyback:150.1] Out: 700 [Urine:700]  Recent Labs    05/20/19 1813 05/20/19 2032 05/21/19 0610  HGB 13.5 14.3 11.8*   Recent Labs    05/20/19 1813 05/20/19 1813 05/20/19 2032 05/21/19 0610  WBC 13.1*  --   --  11.2*  RBC 4.65  --   --  4.07  HCT 44.8   < > 42.0 39.0  PLT 322  --   --  258   < > = values in this interval not displayed.   Recent Labs    05/20/19 1813 05/20/19 2032  NA 137 138  K 6.2* 4.9  CL 105 107  CO2 17*  --   BUN 13 16  CREATININE 0.93 0.60  GLUCOSE 248* 229*  CALCIUM 8.0*  --    No results for input(s): LABPT, INR in the last 72 hours.  Neurologically intact Intact pulses distally Incision: dressing C/D/I Compartment soft able to move fingers wrist and elbow with minimal pain Some numbness in right hand, due to  block  Assessment/Plan:   Right shoulder subacromial impingement, s/p scope: - okay for patient to move arm as tolerated -remain in sling when in bed -keep dressings on dry and intact -will follow up in the office in 2 weeks -Please call with any questions or concerns    Derrick Ravel [phone] 05/21/2019, 6:41 AM

## 2019-05-22 ENCOUNTER — Inpatient Hospital Stay (HOSPITAL_COMMUNITY): Payer: Medicare HMO

## 2019-05-22 DIAGNOSIS — J9601 Acute respiratory failure with hypoxia: Secondary | ICD-10-CM | POA: Diagnosis not present

## 2019-05-22 LAB — GLUCOSE, CAPILLARY
Glucose-Capillary: 173 mg/dL — ABNORMAL HIGH (ref 70–99)
Glucose-Capillary: 225 mg/dL — ABNORMAL HIGH (ref 70–99)
Glucose-Capillary: 210 mg/dL — ABNORMAL HIGH (ref 70–99)
Glucose-Capillary: 227 mg/dL — ABNORMAL HIGH (ref 70–99)

## 2019-05-22 LAB — BASIC METABOLIC PANEL
Anion gap: 10 (ref 5–15)
BUN: 9 mg/dL (ref 6–20)
CO2: 24 mmol/L (ref 22–32)
Calcium: 8 mg/dL — ABNORMAL LOW (ref 8.9–10.3)
Chloride: 103 mmol/L (ref 98–111)
Creatinine, Ser: 0.64 mg/dL (ref 0.44–1.00)
GFR calc Af Amer: >60 mL/min (ref >60)
GFR calc non Af Amer: >60 mL/min (ref >60)
Glucose, Bld: 190 mg/dL — ABNORMAL HIGH (ref 70–99)
Potassium: 4 mmol/L (ref 3.5–5.1)
Sodium: 137 mmol/L (ref 135–145)

## 2019-05-22 LAB — CBC
HCT: 36.1 % (ref 36.0–46.0)
Hemoglobin: 11.1 g/dL — ABNORMAL LOW (ref 12.0–15.0)
MCH: 28.7 pg (ref 26.0–34.0)
MCHC: 30.7 g/dL (ref 30.0–36.0)
MCV: 93.3 fL (ref 80.0–100.0)
Platelets: 229 10*3/uL (ref 150–400)
RBC: 3.87 MIL/uL (ref 3.87–5.11)
RDW: 17 % — ABNORMAL HIGH (ref 11.5–15.5)
WBC: 9.4 10*3/uL (ref 4.0–10.5)
nRBC: 0 % (ref 0.0–0.2)

## 2019-05-22 MED ORDER — DICYCLOMINE HCL 20 MG PO TABS
40.0000 mg | ORAL_TABLET | Freq: Two times a day (BID) | ORAL | Status: DC
  Administered 2019-05-22 – 2019-05-23 (×3): 40 mg via ORAL
  Filled 2019-05-22 (×5): qty 2

## 2019-05-22 MED ORDER — ACETAMINOPHEN 325 MG PO TABS
650.0000 mg | ORAL_TABLET | Freq: Four times a day (QID) | ORAL | Status: DC | PRN
  Administered 2019-05-22 – 2019-05-23 (×2): 650 mg via ORAL
  Filled 2019-05-22 (×2): qty 2

## 2019-05-22 MED ORDER — LEVOFLOXACIN 750 MG PO TABS: 750.0000 mg | ORAL_TABLET | Freq: Every day | ORAL | Status: DC

## 2019-05-22 MED ORDER — CANAGLIFLOZIN 100 MG PO TABS
100.0000 mg | ORAL_TABLET | Freq: Every day | ORAL | Status: DC
  Administered 2019-05-23: 100 mg via ORAL
  Filled 2019-05-22: qty 1

## 2019-05-22 MED ORDER — INSULIN ASPART 100 UNIT/ML ~~LOC~~ SOLN
0.0000 [IU] | Freq: Three times a day (TID) | SUBCUTANEOUS | Status: DC
  Administered 2019-05-22 (×2): 3 [IU] via SUBCUTANEOUS
  Administered 2019-05-23: 09:00:00 2 [IU] via SUBCUTANEOUS

## 2019-05-22 MED ORDER — METFORMIN HCL 500 MG PO TABS
1000.0000 mg | ORAL_TABLET | Freq: Two times a day (BID) | ORAL | Status: DC
  Administered 2019-05-22 – 2019-05-23 (×3): 1000 mg via ORAL
  Filled 2019-05-22 (×3): qty 2

## 2019-05-22 NOTE — Progress Notes (Signed)
NAME:  Wanda Oneill, MRN:  [id number], DOB:  Jan 16, 1981, LOS: 2 ADMISSION DATE:  05/20/2019, CONSULTATION DATE:  4/15 REFERRING MD:  Dr Melina Copa, CHIEF COMPLAINT:  Respiratory distress  Brief History   4 yowf with Down Syndrome presenting with respiratory failure from surgery center after elective shoulder surgery under interscalene block. Imaging on admission showed elevated R hemidiaphragm. Started on BiPAP.   History of present illness   39 year old female with PMH as below, which is significant for diabetes mellitus and Down Syndrome. She is status post both doses of COVID-19 vaccine. She presented to outpatient surgery center on 4/15 for elective bone spur shaving of the R shoulder. Surgery done under interscalene block. Her father   at bedside ( [place] physician) reported from the moment the block was done he began noticing desaturations. Surgery was without complication. In the post-operative setting, however, she was dyspneic with new O2 requirement. This prompted transfer to [facility] ED, where she was requiring 15L NRB to maintain sats in the 90s. Chest radiograph concerning for elevated R hemidiaphragm. Anesthesia was consulted by ED, and they felt  this is a likely consequence of the interscalene block. PCCM was consulted for admission.   Past Medical History   has a past medical history of Diabetes mellitus, Down syndrome, Family history of adverse reaction to anesthesia, Gastritis, GERD (gastroesophageal reflux disease), Headache, Hepatic steatosis, Hidradenitis, Hypothyroidism, Irritable bowel syndrome, Periumbilical hernia, Pneumonia, Restless legs, Tendonitis, and Thyroid disease.  Significant Hospital Events   4/16: elective shoulder surgery under interscalene block.>> R Phrenic Nerve  paralysis from interscalene block vs RML collapse   Consults:    Procedures:    Significant Diagnostic Tests:     Micro Data:  MRSA  PCR  4/16 neg  Covid 19 PCR  4/15 neg  Blood 4/16 >>>   Antimicrobials:  Levofloxacin 4/15 > 4/17   Interim history/subjective:  Just on nasal 02 overnight and did fine, wants to go home so she can go to church on Sunday April 18   Scheduled Meds: . chlorhexidine  15 mL Mouth Rinse BID  . Chlorhexidine Gluconate Cloth  6 each Topical Daily  . cholestyramine  4 g Oral BID  . dicyclomine  40 mg Oral BID  . heparin  5,000 Units Subcutaneous Q8H  . insulin aspart  0-15 Units Subcutaneous TID WC  . levothyroxine  137 mcg Oral Q0600  . mouth rinse  15 mL Mouth Rinse q12n4p   Continuous Infusions: . lactated ringers Stopped (05/22/19 0754)   PRN Meds:.docusate sodium, fentaNYL (SUBLIMAZE) injection, ondansetron (ZOFRAN) IV, polyethylene glycol    Objective   Blood pressure 122/80, pulse 98, temperature 98.5 F (36.9 C), temperature source Oral, resp. rate (!) 33, height 5\' 4"  (1.626 m), weight 116.9 kg, SpO2 (!) 88 %.    FiO2 (%):  [44 %-60 %] 44 %   Intake/Output Summary (Last 24 hours) at 05/22/2019 0834 Last data filed at 05/22/2019 0800 Gross per 24 hour  Intake 1145.5 ml  Output --  Net 1145.5 ml   Filed Weights   05/21/19 0030 05/21/19 0224  Weight: 116.9 kg 116.9 kg    Examination:  Obese pleasant wf down syndrome habitus/ nad in recliner 45 degrees with sats lower 90s on 5lpm NP  Pt alert, approp   No jvd Oropharynx clear,  mucosa nl Neck supple Lungs with decreased bs L base / IC by IS = 750cc max  RRR no s3 or or  sign murmur Abd obese with limited excursion  Extr warm with no edema or clubbing noted R shoulder in sling / dressing clean and dry per nursing        Resolved Hospital Problem list     Assessment & Plan:   Acute hypoxemic respiratory failure: likely secondary to paralyzed right hemidiaphragm >> collapsed RML as a possible complication of interscalene block.  .  - Aggressive Pulmonary Toilet, IS and Flutter valve, OOB to chair, ambulate in hall - SpO2 goal > 92% rec  >>>  Continue Nasal 02 taper, IS/ mobilize     Leukocytosis, resolved as of 3/17  T Max last 24 hours  = 98.5  rec >>>  D/c levaquin 4/17        DM - CBG monitoring  AC and HS -  Continue to hold  metformin, semaglutide,  and empagliflozin until taking PO more reliably    Hypothyroidism Lab Results  Component Value Date   TSH 3.669 07/03/2011  >>> recheck tsh     Chronic abdominal pain: Ventral hernia >>> rec increase home bentyl to 20 mg x 2  = 40 mg twice daily per mother's request as this is hw she takes it at home      Nutrition   rec >>> Heart Healthy Carb Modified Diet        Best practice:  Diet: Heart Healthy Carb Modified Diet Pain/Anxiety/Delirium protocol (if indicated):  VAP protocol (if indicated): NA DVT prophylaxis: sub Q hep  GI prophylaxis: NA Glucose control: SSI Mobility: BR Code Status: ICU Family Communication: pending  Disposition: ICU > ok for transfer to floor but not ready for home d/c yet  Labs   CBC: Recent Labs  Lab 05/20/19 1813 05/20/19 2032 05/21/19 0610 05/22/19 0336  WBC 13.1*  --  11.2* 9.4  NEUTROABS 11.4*  --   --   --   HGB 13.5 14.3 11.8* 11.1*  HCT 44.8 42.0 39.0 36.1  MCV 96.3  --  95.8 93.3  PLT 322  --  258 [id number]    Basic Metabolic Panel: Recent Labs  Lab 05/20/19 1813 05/20/19 2032 05/21/19 0610 05/22/19 0336  NA 137 138 140 137  K 6.2* 4.9 4.4 4.0  CL 105 107 106 103  CO2 17*  --  21* 24  GLUCOSE 248* 229* 181* 190*  BUN 13 16 11 9   CREATININE 0.93 0.60 0.69 0.64  CALCIUM 8.0*  --  8.0* 8.0*  MG  --   --  1.7  --   PHOS  --   --  3.3  --    GFR: Estimated Creatinine Clearance: 118.6 mL/min (by C-G formula based on SCr of 0.64 mg/dL). Recent Labs  Lab 05/20/19 1813 05/21/19 0610 05/22/19 0336  WBC 13.1* 11.2* 9.4    Liver Function Tests: No results for input(s): AST, ALT, ALKPHOS, BILITOT, PROT, ALBUMIN in the last 168 hours. No results for input(s): LIPASE, AMYLASE in the last 168  hours. No results for input(s): AMMONIA in the last 168 hours.  ABG    Component Value Date/Time   TCO2 22 05/20/2019 2032     Coagulation Profile: No results for input(s): INR, PROTIME in the last 168 hours.  Cardiac Enzymes: No results for input(s): CKTOTAL, CKMB, CKMBINDEX, TROPONINI in the last 168 hours.  HbA1C: Hemoglobin A1C  Date/Time Value Ref Range Status  10/21/2011 12:23 PM 6.5  Final  07/03/2011 02:15 PM 6.5  Final   Hgb A1c MFr Bld  Date/Time Value Ref Range Status  10/20/2017 02:29 PM 7.2 (H) 4.8 - 5.6 % Final    Comment:    (NOTE)         Prediabetes: 5.7 - 6.4         Diabetes: >6.4         Glycemic control for adults with diabetes: <7.0     CBG: Recent Labs  Lab 05/21/19 1157 05/21/19 1724 05/21/19 2211 05/22/19 0719  GLUCAP 220* 238* 211* 173*     Total time 35 min  Christinia Gully, MD Pulmonary and [place] Cell [phone] After 6:00 PM or weekends, use Beeper [phone]  After 7:00 pm call Elink  [phone]

## 2019-05-22 NOTE — Progress Notes (Signed)
Physical Therapy Treatment Patient Details Name: Wanda Oneill MRN: [id number] DOB: Aug 22, 1980 Today's Date: 05/22/2019    History of Present Illness 39 y.o. female admitted on 05/20/19 after elective R shoulder arthroscopy and SAD with difficulty breathing post op most likely from interscalene block that partially paralyzed her right hemidiaphragm.  She was admitted to [facility] from outpatient surgery center of [facility] for further treatment requiring BiPap to maintain sats. PMHX: periumbilical hernia, down syndrome, DM, R partial knee arthroplasty, hernia repair.    PT Comments    Pt sitting in chair on arrival, pleasant and ready to walk stating it helps relieve her back pain. Pt with sling on on arrival and reports having not moved her shoulder at all since admission. Sling removed with pt holding arm to chest during gait but able to perform PROM of shoulder during session. Pt with SpO2 89-93% on 2L with gait with seated rest after 150' and cues for breathing technique. Pt provided handouts for PROm and AAROm of rt shoulder.     Follow Up Recommendations  No PT follow up;Follow surgeon's recommendation for DC plan and follow-up therapies     Equipment Recommendations  None recommended by PT    Recommendations for Other Services       Precautions / Restrictions Precautions Precautions: Fall Precaution Comments: Rt knee dysfunction, watch sats Required Braces or Orthoses: Sling(for comfort) Restrictions RUE Weight Bearing: Weight bearing as tolerated Other Position/Activity Restrictions: ROM as tolerated    Mobility  Bed Mobility               General bed mobility comments: in chair on arrival  Transfers Overall transfer level: Needs assistance   Transfers: Sit to/from Stand Sit to Stand: Supervision         General transfer comment: supervision to stand to and  from recliner and to and from toilet  Ambulation/Gait Ambulation/Gait assistance: Min  guard Gait Distance (Feet): 150 Feet Assistive device: 1 person hand held assist Gait Pattern/deviations: Step-through pattern;Antalgic   Gait velocity interpretation: >2.62 ft/sec, indicative of community ambulatory General Gait Details: R knee antalgic gait pattern, but she reports this is her baseline.  HHA throughout gait but without significant support provided. Pt walked 150' x 2 with seated rest between trials   Stairs             Wheelchair Mobility    Modified Rankin (Stroke Patients Only)       Balance Overall balance assessment: Mild deficits observed, not formally tested                                          Cognition Arousal/Alertness: Awake/alert Behavior During Therapy: WFL for tasks assessed/performed Overall Cognitive Status: History of cognitive impairments - at baseline                                 General Comments: Down Syndrome at baseline, likely currently at her baseline.      Exercises General Exercises - Upper Extremity Shoulder Flexion: PROM;Right;Seated;10 reps Shoulder Extension: 10 reps;Seated;Right;PROM Shoulder ABduction: PROM;Right;Seated;10 reps    General Comments        Pertinent Vitals/Pain Faces Pain Scale: Hurts little more Pain Location: R shoulder Pain Descriptors / Indicators: Aching;Sore Pain Intervention(s): Limited activity within patient's tolerance;Repositioned    Home Living  Prior Function            PT Goals (current goals can now be found in the care plan section) Progress towards PT goals: Progressing toward goals    Frequency    Min 5X/week      PT Plan Current plan remains appropriate    Co-evaluation              AM-PAC PT "6 Clicks" Mobility   Outcome Measure  Help needed turning from your back to your side while in a flat bed without using bedrails?: A Little Help needed moving from lying on your back to sitting  on the side of a flat bed without using bedrails?: A Little Help needed moving to and from a bed to a chair (including a wheelchair)?: A Little Help needed standing up from a chair using your arms (e.g., wheelchair or bedside chair)?: A Little Help needed to walk in hospital room?: A Little Help needed climbing 3-5 steps with a railing? : A Little 6 Click Score: 18    End of Session Equipment Utilized During Treatment: Oxygen Activity Tolerance: Patient tolerated treatment well Patient left: in chair;with call bell/phone within reach Nurse Communication: Mobility status PT Visit Diagnosis: Muscle weakness (generalized) (M62.81);Difficulty in walking, not elsewhere classified (R26.2);Pain Pain - Right/Left: Right Pain - part of body: Shoulder     Time: 0921-0950 PT Time Calculation (min) (ACUTE ONLY): 29 min  Charges:  $Gait Training: 8-22 mins $Therapeutic Activity: 8-22 mins                     Wanda Oneill P, PT Acute Rehabilitation Services Pager: [phone] Office: [place] 05/22/2019, 2:09 PM

## 2019-05-23 DIAGNOSIS — J9601 Acute respiratory failure with hypoxia: Secondary | ICD-10-CM | POA: Diagnosis not present

## 2019-05-23 LAB — BASIC METABOLIC PANEL
Anion gap: 13 (ref 5–15)
BUN: 6 mg/dL (ref 6–20)
CO2: 23 mmol/L (ref 22–32)
Calcium: 8.3 mg/dL — ABNORMAL LOW (ref 8.9–10.3)
Chloride: 101 mmol/L (ref 98–111)
Creatinine, Ser: 0.58 mg/dL (ref 0.44–1.00)
GFR calc Af Amer: >60 mL/min (ref >60)
GFR calc non Af Amer: >60 mL/min (ref >60)
Glucose, Bld: 178 mg/dL — ABNORMAL HIGH (ref 70–99)
Potassium: 4.2 mmol/L (ref 3.5–5.1)
Sodium: 137 mmol/L (ref 135–145)

## 2019-05-23 LAB — GLUCOSE, CAPILLARY: Glucose-Capillary: 185 mg/dL — ABNORMAL HIGH (ref 70–99)

## 2019-05-23 LAB — TSH: TSH: 3.804 u[IU]/mL (ref 0.350–4.500)

## 2019-05-23 NOTE — TOC Transition Note (Signed)
Transition of Care [facility]) - CM/SW Discharge Note Marvetta Gibbons RN, BSN Transitions of Care Unit 4E- RN Case Manager [phone] Weekend Coverage   Patient Details  Name: Wanda Oneill MRN: [id number] Date of Birth: 1980/12/24  Transition of Care [facility]) CM/SW Contact:  Dawayne Patricia, RN Phone Number: 05/23/2019, 2:04 PM   Clinical Narrative:    Pt stable for transition home, received call the AM regarding home 02 needs from bedside RN. Narrative note placed by bedside RN and home 02 orders for DME. MD would like to transition home once 02 arrangements in place. Call made to [facility] with [employer] for home 02 referral. Portable 02 for transport home will be delivered to room this afternoon for transition home.    Final next level of care: Home/Self Care Barriers to Discharge: No Barriers Identified   Patient Goals and CMS Choice        Discharge Placement               Home        Discharge Plan and Services                DME Arranged: Oxygen DME Agency: Other - Comment([employer]) Date DME Agency Contacted: 05/23/19 Time DME Agency Contacted: [id number] Representative spoke with at DME Agency: Brenton Grills HH Arranged: NA [place] Agency: NA        Social Determinants of Health ([place]) Interventions     Readmission Risk Interventions Readmission Risk Prevention Plan 05/23/2019  Post Dischage Appt Complete  Medication Screening Complete  Transportation Screening Complete  Some recent data might be hidden

## 2019-05-23 NOTE — Progress Notes (Signed)
NAME:  Wanda Oneill, MRN:  [id number], DOB:  06-19-1980, LOS: 3 ADMISSION DATE:  05/20/2019, CONSULTATION DATE:  4/15 REFERRING MD:  Dr Melina Copa, CHIEF COMPLAINT:  Respiratory distress  Brief History   39 yowf with Down Syndrome presenting with respiratory failure from surgery center after elective R shoulder surgery under interscalene block. Imaging on admission showed elevated R hemidiaphragm. Started on BiPAP.   History of present illness   39 year old female with PMH as below, which is significant for diabetes mellitus and Down Syndrome. She is status post both doses of COVID-19 vaccine. She presented to outpatient surgery center on 4/15 for elective bone spur shaving of the R shoulder. Surgery done under interscalene block. Her father   at bedside ( [place] physician) reported from the moment the block was done he began noticing desaturations. Surgery was without complication. In the post-operative setting, however, she was dyspneic with new O2 requirement. This prompted transfer to [facility] ED, where she was requiring 15L NRB to maintain sats in the 90s. Chest radiograph concerning for elevated R hemidiaphragm. Anesthesia was consulted by ED, and they felt  this is a likely consequence of the interscalene block. PCCM was consulted for admission.   Past Medical History   has a past medical history of Diabetes mellitus, Down syndrome, Family history of adverse reaction to anesthesia, Gastritis, GERD (gastroesophageal reflux disease), Headache, Hepatic steatosis, Hidradenitis, Hypothyroidism, Irritable bowel syndrome, Periumbilical hernia, Pneumonia, Restless legs, Tendonitis, and Thyroid disease.  Significant Hospital Events   4/16: elective shoulder surgery under interscalene block.>> R Phrenic Nerve  paralysis from interscalene block vs RML collapse   Consults:    Procedures:    Significant Diagnostic Tests:     Micro Data:  MRSA  PCR  4/16 neg  Covid 19 PCR  4/15 neg  Blood 4/16 > No growth as of 4/18 am    Antimicrobials:  Levofloxacin 4/15 > 4/17   Interim history/subjective:  Ambulated on 2lpm 3/17, ok for d/c from ortho perspective   Scheduled Meds: . canagliflozin  100 mg Oral QAC breakfast  . chlorhexidine  15 mL Mouth Rinse BID  . Chlorhexidine Gluconate Cloth  6 each Topical Daily  . cholestyramine  4 g Oral BID  . dicyclomine  40 mg Oral BID  . heparin  5,000 Units Subcutaneous Q8H  . insulin aspart  0-9 Units Subcutaneous TID WC  . levothyroxine  137 mcg Oral Q0600  . mouth rinse  15 mL Mouth Rinse q12n4p  . metFORMIN  1,000 mg Oral BID WC   Continuous Infusions: . lactated ringers Stopped (05/22/19 1639)   PRN Meds:.acetaminophen, docusate sodium, fentaNYL (SUBLIMAZE) injection, ondansetron (ZOFRAN) IV, polyethylene glycol    Objective   Blood pressure 125/81, pulse 87, temperature 99.3 F (37.4 C), temperature source Oral, resp. rate (!) 26, height 5\' 4"  (1.626 m), weight 116.9 kg, SpO2 96 %.    FiO2 (%):  [44 %] 44 %   Intake/Output Summary (Last 24 hours) at 05/23/2019 1015 Last data filed at 05/23/2019 0200 Gross per 24 hour  Intake 504.35 ml  Output --  Net 504.35 ml   Filed Weights   05/21/19 0030 05/21/19 0224  Weight: 116.9 kg 116.9 kg    Examination: Tmax 99.3   alert wf amb in room on 4lpm NP  Pt alert, appropriately joyful No jvd Neck supple Lungs drecreased bs bases R > L  RRR no s3 or or sign murmur Abd obese with  limitedexcursion  Extr warm with no edema or clubbing noted No longer in shoulder sling          Resolved Hospital Problem list     Assessment & Plan:   Acute hypoxemic respiratory failure: likely secondary to paralyzed right hemidiaphragm >> collapsed RML as a possible complication of interscalene block.  .  - Aggressive Pulmonary Toilet, IS and Flutter valve, OOB to chair, ambulate in hall - SpO2 goal > 92% rec >>>  Continue Nasal 02 taper for sats > 90% reviewed  with Dr Linna Darner who has  done this with past post op setting    Leukocytosis, resolved as of 3/17  T Max last 24 hours  = 98.5  rec >>>  D/c'd  levaquin 4/17        DM - CBG monitoring  AC and HS -  Resumed   metformin, semaglutide,  and empagliflozin as per home rx     Hypothyroidism Lab Results  Component Value Date   TSH 3.804 05/23/2019  >>> no change synthroid rx     Chronic abdominal pain: Ventral hernia >>> rec increase home bentyl to 20 mg x 2  = 40 mg twice daily per mother's request as this is hw she takes it at home      Nutrition   rec >>> Heart Healthy Carb Modified Diet       Labs   CBC: Recent Labs  Lab 05/20/19 1813 05/20/19 2032 05/21/19 0610 05/22/19 0336  WBC 13.1*  --  11.2* 9.4  NEUTROABS 11.4*  --   --   --   HGB 13.5 14.3 11.8* 11.1*  HCT 44.8 42.0 39.0 36.1  MCV 96.3  --  95.8 93.3  PLT 322  --  258 [id number]    Basic Metabolic Panel: Recent Labs  Lab 05/20/19 1813 05/20/19 2032 05/21/19 0610 05/22/19 0336 05/23/19 0607  NA 137 138 140 137 137  K 6.2* 4.9 4.4 4.0 4.2  CL 105 107 106 103 101  CO2 17*  --  21* 24 23  GLUCOSE 248* 229* 181* 190* 178*  BUN 13 16 11 9 6   CREATININE 0.93 0.60 0.69 0.64 0.58  CALCIUM 8.0*  --  8.0* 8.0* 8.3*  MG  --   --  1.7  --   --   PHOS  --   --  3.3  --   --    GFR: Estimated Creatinine Clearance: 118.6 mL/min (by C-G formula based on SCr of 0.58 mg/dL). Recent Labs  Lab 05/20/19 1813 05/21/19 0610 05/22/19 0336  WBC 13.1* 11.2* 9.4    Liver Function Tests: No results for input(s): AST, ALT, ALKPHOS, BILITOT, PROT, ALBUMIN in the last 168 hours. No results for input(s): LIPASE, AMYLASE in the last 168 hours. No results for input(s): AMMONIA in the last 168 hours.  ABG    Component Value Date/Time   TCO2 22 05/20/2019 2032     Coagulation Profile: No results for input(s): INR, PROTIME in the last 168 hours.  Cardiac Enzymes: No results for input(s): CKTOTAL, CKMB, CKMBINDEX,  TROPONINI in the last 168 hours.  HbA1C: Hemoglobin A1C  Date/Time Value Ref Range Status  10/21/2011 12:23 PM 6.5  Final  07/03/2011 02:15 PM 6.5  Final   Hgb A1c MFr Bld  Date/Time Value Ref Range Status  10/20/2017 02:29 PM 7.2 (H) 4.8 - 5.6 % Final    Comment:    (NOTE)         Prediabetes: 5.7 -  6.4         Diabetes: >6.4         Glycemic control for adults with diabetes: <7.0     CBG: Recent Labs  Lab 05/22/19 0719 05/22/19 1124 05/22/19 1636 05/22/19 2147 05/23/19 0756  GLUCAP 173* 225* 210* 227* 185*      Ready for discharge this pm on same meds as was on preop  if we can arrange 02 4lpm per conc/ tank and let her father taper her off as tolerated   Follow up with cxr in office in 2 weeks, call sooner if needed    Christinia Gully, MD Pulmonary and [place] Cell [phone] After 6:00 PM or weekends, use [facility] [phone]  After 7:00 pm call Elink  [phone]

## 2019-05-23 NOTE — Plan of Care (Signed)

## 2019-05-23 NOTE — Progress Notes (Signed)
SATURATION QUALIFICATIONS: (This note is used to comply with regulatory documentation for home oxygen)  Patient Saturations on Room Air at Rest = 88%  Patient Saturations on Room Air while Ambulating = 81%  Patient Saturations on 4 Liters of oxygen while Ambulating = 92%  Please briefly explain why patient needs home oxygen: Patient had post-surgical complication required oxygen.

## 2019-05-23 NOTE — Progress Notes (Signed)
[place] Progress Note Patient Name: Wanda Oneill DOB: [birth year] MRN: [id number]   Date of Service  05/23/2019  HPI/Events of Note  Request to transfer patient to make a bed for a critically ill trauma patient. Sat = 95% on 4 L/min [place] O2 and RR = 33-40.   eICU Interventions  Will transfer to a Progressive bed so that respiratory status can be monitored.      Intervention Category Major Interventions: Other:  Avrum Kimball Cornelia Copa 05/23/2019, 3:08 AM

## 2019-05-23 NOTE — Discharge Summary (Addendum)
Physician Discharge Summary         Patient ID: Wanda Oneill MRN: [id number] DOB/AGE: November 19, 1980 39 y.o.  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 She is status post both doses of COVID-19 vaccine. She presented to outpatient surgery center on 4/15 for elective bone spur shaving of the R shoulder. Surgery done under interscalene block. Her father   at bedside ( [place] physician) reported from the moment the block was done he began noticing desaturations. Surgery was without complication. In the post-operative setting, however, she was dyspneic with new O2 requirement. This prompted transfer to [facility] ED, where she was requiring 15L NRB to maintain sats in the 90s. Chest radiograph concerning for elevated R hemidiaphragm. Anesthesia was consulted by ED, and they felt  this is a likely consequence of the interscalene block. PCCM was consulted for admission.   Gradually improved 02 needs with Bipap then IS but still 02 dep at d/c at 4lpm 24 h  With plans to wean the 02 off for sats > 90% by her father, a physician, and f/u with Dr Theda Sers an [place] w/in 2 weeks, the latter with CXR on return   Instructed to use the  IS as much as possible with improvement during ICU stay of up to 1.1 liters and off bipap since am 4/16       Significant Hospital tests/ studies  4/16: elective shoulder surgery under interscalene block.>> R Phrenic Nerve  paralysis from interscalene block vs RML collapse   Procedures    Culture data/antimicrobials   MRSA  PCR  4/16 neg  Covid 19 PCR 4/15 neg  Blood 4/16 > No growth as of 4/18 am      Consults      Discharge Exam: BP 125/81   Pulse 89   Temp 99.3 F (37.4 C) (Oral)   Resp 20   Ht '5\' 4"'  (1.626 m)   Wt 116.9 kg   SpO2 95%   BMI 44.24 kg/m   Tmax 99.3   alert wf amb in room on 4lpm NP  Pt alert, appropriately joyful No jvd Neck supple Lungs drecreased bs bases R > L  RRR no s3 or or sign murmur Abd obese with  limitedexcursion  Extr warm with no edema or clubbing noted No longer in shoulder sling     Labs at discharge   Lab Results  Component Value Date   CREATININE 0.58 05/23/2019   BUN 6 05/23/2019   NA 137 05/23/2019   K 4.2 05/23/2019   CL 101 05/23/2019   CO2 23 05/23/2019   Lab Results  Component Value Date   WBC 9.4 05/22/2019   HGB 11.1 (L) 05/22/2019   HCT 36.1 05/22/2019   MCV 93.3 05/22/2019   PLT 229 05/22/2019   Lab Results  Component Value Date   ALT 26 04/07/2013   AST 22 04/07/2013   ALKPHOS 48 04/07/2013   BILITOT 0.7 04/07/2013   No results found for: INR, PROTIME  Current radiological studies    DG CHEST PORT 1 VIEW  Result Date: 05/22/2019 CLINICAL DATA:  Acute respiratory failure. EXAM: PORTABLE CHEST 1 VIEW  COMPARISON:  May 21, 2019. FINDINGS: Stable cardiomegaly. No pneumothorax is noted. Increased bibasilar atelectasis or infiltrates are noted with associated pleural effusions. Bony thorax is unremarkable. IMPRESSION: Increased bibasilar atelectasis or infiltrates are noted with associated pleural effusions. Electronically Signed   By: Marijo Conception M.D.   On: 05/22/2019 10:18    Disposition:    Discharge disposition: 01-Home or Self Care       Discharge Instructions    Call MD for:  difficulty breathing, headache or visual disturbances   Complete by: As directed    Diet - low sodium heart healthy   Complete by: As directed    Discharge instructions   Complete by: As directed    See Dr Theda Sers in 2 weeks  See Dr Grace Blight  Or Melvyn Novas in 2 weeks   Increase activity slowly   Complete by: As  directed       Allergies as of 05/23/2019      Reactions   Vancomycin Itching   Cefuroxime Hives, Swelling   Unspecified location of swelling   Cefuroxime Axetil Hives   Sulfa Antibiotics Hives      Medication List    STOP taking these medications   DULoxetine 30 MG capsule Commonly known as: Cymbalta   fluconazole 50 MG tablet Commonly known as: DIFLUCAN   itraconazole 100 MG capsule Commonly known as: Sporanox     TAKE these medications   acetaminophen 325 MG tablet Commonly known as: TYLENOL Take 650 mg by mouth every 6 (six) hours as needed for mild pain.   allopurinol 300 MG tablet Commonly known as: ZYLOPRIM Take 1 tablet (300 mg total) by mouth daily.   blood glucose meter kit and supplies Dispense based on patient and insurance preference. To check blood sugars daily FOR ICD-9 250.00 What changed:   how much to take  how to take this  when to take this   cetirizine 10 MG tablet Commonly known as: ZYRTEC Take 10 mg by mouth daily as needed for allergies.   cholestyramine 4 g packet Commonly known as: QUESTRAN DISSOLVE & TAKE 1 POWDER BY MOUTH TWICE DAILY What changed:   how much to take  how to take this  when to take this  additional instructions   clobetasol cream 0.05 % Commonly known as: TEMOVATE Apply 1 application topically daily as needed (for eczema).   dicyclomine 20 MG tablet Commonly known as: BENTYL Take 1 tablet (20 mg total) by mouth 4 (four) times daily -  before meals and at bedtime. What changed: when to take this   Jardiance 25 MG Tabs tablet Generic drug: empagliflozin Take 25 mg by mouth daily.   Jolessa 0.15-0.03 MG tablet Generic drug: levonorgestrel-ethinyl estradiol Take 1 tablet by mouth once daily   levothyroxine 137 MCG tablet Commonly known as: SYNTHROID TAKE 1 TABLET BY MOUTH IN THE MORNING . APPOINTMENT REQUIRED FOR FUTURE REFILLS What changed: See the new instructions.   metFORMIN 1000 MG  tablet Commonly known as: GLUCOPHAGE Take 1,000 mg by mouth 2 (two) times daily.   naproxen sodium 220 MG tablet Commonly known as: ALEVE Take 220 mg by mouth daily as needed (pain).   nystatin powder Commonly known as: MYCOSTATIN/NYSTOP Apply 1 application topically 3 (three) times daily. Use as needed for candida intertrigo What changed:   when to take this  reasons to take this  additional instructions   ondansetron 4 MG tablet Commonly known as: ZOFRAN Take 4 mg by mouth every 8 (eight)  hours as needed for nausea/vomiting.   ONE TOUCH ULTRA TEST test strip Generic drug: glucose blood USE TO CHECK BLOOD SUGAR ONCE DAILY (ICD CODE:25.00) What changed: See the new instructions.   BLOOD GLUCOSE TEST STRIPS Strp Use to test blood sugar up to twice a day What changed:   how much to take  how to take this  when to take this   oxyCODONE 5 MG immediate release tablet Commonly known as: Oxy IR/ROXICODONE Take 5 mg by mouth every 4 (four) hours as needed for pain.   Ozempic (0.25 or 0.5 MG/DOSE) 2 MG/1.5ML Sopn Generic drug: Semaglutide(0.25 or 0.5MG/DOS) Inject 0.5 mg into the skin once a week. Saturdays            Durable Medical Equipment  (From admission, onward)         Start     Ordered   05/23/19 1342  DME Oxygen  Once    Question Answer Comment  Length of Need 6 Months   Mode or (Route) Nasal cannula   Liters per Minute 4   Frequency Continuous (stationary and portable oxygen unit needed)   Oxygen conserving device No   Oxygen delivery system Gas      05/23/19 1341   05/23/19 1212  For home use only DME oxygen  Once    Question Answer Comment  Length of Need 6 Months   Mode or (Route) Nasal cannula   Liters per Minute 4   Frequency Continuous (stationary and portable oxygen unit needed)   Oxygen conserving device No   Oxygen delivery system Gas      05/23/19 1212           Follow-up appointment   Dr Theda Sers 2 weeks  Dr Lamonte Sakai or Melvyn Novas  2 weeks with cxr    Discharge Condition:   Improved but still 02 dependent   I personally directed all aspects of d/c planning including arranging for 24 h 02 and reviewing instructions with her mom on the am of d/c  Total time spent = 35 minutes    Signed: Christinia Gully 05/23/2019, 1:43 PM

## 2019-05-26 ENCOUNTER — Inpatient Hospital Stay (HOSPITAL_COMMUNITY)
Admission: EM | Admit: 2019-05-26 | Discharge: 2019-06-01 | DRG: 177 | Disposition: A | Discharge status: Home / Self Care | Payer: Medicare HMO | Attending: Internal Medicine | Admitting: Internal Medicine

## 2019-05-26 ENCOUNTER — Ambulatory Visit: Payer: Medicare HMO | Admitting: Physical Therapy

## 2019-05-26 ENCOUNTER — Other Ambulatory Visit: Payer: Self-pay | Admitting: Internal Medicine

## 2019-05-26 ENCOUNTER — Other Ambulatory Visit: Payer: Self-pay

## 2019-05-26 ENCOUNTER — Telehealth: Payer: Self-pay | Admitting: Internal Medicine

## 2019-05-26 ENCOUNTER — Encounter (HOSPITAL_COMMUNITY): Payer: Self-pay

## 2019-05-26 ENCOUNTER — Emergency Department (HOSPITAL_COMMUNITY): Payer: Medicare HMO

## 2019-05-26 DIAGNOSIS — J189 Pneumonia, unspecified organism: Secondary | ICD-10-CM | POA: Diagnosis not present

## 2019-05-26 DIAGNOSIS — Z9049 Acquired absence of other specified parts of digestive tract: Secondary | ICD-10-CM

## 2019-05-26 DIAGNOSIS — Z808 Family history of malignant neoplasm of other organs or systems: Secondary | ICD-10-CM

## 2019-05-26 DIAGNOSIS — K429 Umbilical hernia without obstruction or gangrene: Secondary | ICD-10-CM | POA: Diagnosis present

## 2019-05-26 DIAGNOSIS — R4781 Slurred speech: Secondary | ICD-10-CM | POA: Diagnosis present

## 2019-05-26 DIAGNOSIS — K760 Fatty (change of) liver, not elsewhere classified: Secondary | ICD-10-CM | POA: Diagnosis present

## 2019-05-26 DIAGNOSIS — Z20822 Contact with and (suspected) exposure to covid-19: Secondary | ICD-10-CM | POA: Diagnosis present

## 2019-05-26 DIAGNOSIS — I5033 Acute on chronic diastolic (congestive) heart failure: Secondary | ICD-10-CM | POA: Diagnosis present

## 2019-05-26 DIAGNOSIS — Q909 Down syndrome, unspecified: Secondary | ICD-10-CM

## 2019-05-26 DIAGNOSIS — G2581 Restless legs syndrome: Secondary | ICD-10-CM | POA: Diagnosis present

## 2019-05-26 DIAGNOSIS — J9621 Acute and chronic respiratory failure with hypoxia: Secondary | ICD-10-CM | POA: Diagnosis not present

## 2019-05-26 DIAGNOSIS — M109 Gout, unspecified: Secondary | ICD-10-CM | POA: Diagnosis present

## 2019-05-26 DIAGNOSIS — K2970 Gastritis, unspecified, without bleeding: Secondary | ICD-10-CM | POA: Diagnosis present

## 2019-05-26 DIAGNOSIS — Z882 Allergy status to sulfonamides status: Secondary | ICD-10-CM

## 2019-05-26 DIAGNOSIS — Z6841 Body Mass Index (BMI) 40.0 and over, adult: Secondary | ICD-10-CM | POA: Diagnosis not present

## 2019-05-26 DIAGNOSIS — J811 Chronic pulmonary edema: Secondary | ICD-10-CM

## 2019-05-26 DIAGNOSIS — K449 Diaphragmatic hernia without obstruction or gangrene: Secondary | ICD-10-CM | POA: Diagnosis not present

## 2019-05-26 DIAGNOSIS — Z8249 Family history of ischemic heart disease and other diseases of the circulatory system: Secondary | ICD-10-CM

## 2019-05-26 DIAGNOSIS — J690 Pneumonitis due to inhalation of food and vomit: Secondary | ICD-10-CM | POA: Diagnosis not present

## 2019-05-26 DIAGNOSIS — R9431 Abnormal electrocardiogram [ECG] [EKG]: Secondary | ICD-10-CM | POA: Diagnosis present

## 2019-05-26 DIAGNOSIS — J986 Disorders of diaphragm: Secondary | ICD-10-CM | POA: Diagnosis present

## 2019-05-26 DIAGNOSIS — G5681 Other specified mononeuropathies of right upper limb: Secondary | ICD-10-CM | POA: Diagnosis present

## 2019-05-26 DIAGNOSIS — M779 Enthesopathy, unspecified: Secondary | ICD-10-CM | POA: Diagnosis present

## 2019-05-26 DIAGNOSIS — Z833 Family history of diabetes mellitus: Secondary | ICD-10-CM

## 2019-05-26 DIAGNOSIS — Z881 Allergy status to other antibiotic agents status: Secondary | ICD-10-CM

## 2019-05-26 DIAGNOSIS — Z888 Allergy status to other drugs, medicaments and biological substances status: Secondary | ICD-10-CM

## 2019-05-26 DIAGNOSIS — J9601 Acute respiratory failure with hypoxia: Secondary | ICD-10-CM

## 2019-05-26 DIAGNOSIS — K589 Irritable bowel syndrome without diarrhea: Secondary | ICD-10-CM | POA: Diagnosis present

## 2019-05-26 DIAGNOSIS — Z791 Long term (current) use of non-steroidal anti-inflammatories (NSAID): Secondary | ICD-10-CM

## 2019-05-26 DIAGNOSIS — Z79890 Hormone replacement therapy (postmenopausal): Secondary | ICD-10-CM

## 2019-05-26 DIAGNOSIS — Z79891 Long term (current) use of opiate analgesic: Secondary | ICD-10-CM

## 2019-05-26 DIAGNOSIS — R0602 Shortness of breath: Secondary | ICD-10-CM | POA: Diagnosis not present

## 2019-05-26 DIAGNOSIS — L732 Hidradenitis suppurativa: Secondary | ICD-10-CM | POA: Diagnosis present

## 2019-05-26 DIAGNOSIS — E1165 Type 2 diabetes mellitus with hyperglycemia: Secondary | ICD-10-CM | POA: Diagnosis present

## 2019-05-26 DIAGNOSIS — R0902 Hypoxemia: Secondary | ICD-10-CM | POA: Diagnosis not present

## 2019-05-26 DIAGNOSIS — J9600 Acute respiratory failure, unspecified whether with hypoxia or hypercapnia: Secondary | ICD-10-CM

## 2019-05-26 DIAGNOSIS — E079 Disorder of thyroid, unspecified: Secondary | ICD-10-CM | POA: Diagnosis present

## 2019-05-26 DIAGNOSIS — R059 Cough, unspecified: Secondary | ICD-10-CM

## 2019-05-26 DIAGNOSIS — E039 Hypothyroidism, unspecified: Secondary | ICD-10-CM | POA: Diagnosis not present

## 2019-05-26 DIAGNOSIS — Z96651 Presence of right artificial knee joint: Secondary | ICD-10-CM | POA: Diagnosis present

## 2019-05-26 DIAGNOSIS — K219 Gastro-esophageal reflux disease without esophagitis: Secondary | ICD-10-CM | POA: Diagnosis not present

## 2019-05-26 DIAGNOSIS — I5031 Acute diastolic (congestive) heart failure: Secondary | ICD-10-CM | POA: Diagnosis not present

## 2019-05-26 DIAGNOSIS — Z823 Family history of stroke: Secondary | ICD-10-CM

## 2019-05-26 DIAGNOSIS — Z83438 Family history of other disorder of lipoprotein metabolism and other lipidemia: Secondary | ICD-10-CM

## 2019-05-26 DIAGNOSIS — R05 Cough: Secondary | ICD-10-CM

## 2019-05-26 DIAGNOSIS — Z79899 Other long term (current) drug therapy: Secondary | ICD-10-CM

## 2019-05-26 DIAGNOSIS — R739 Hyperglycemia, unspecified: Secondary | ICD-10-CM

## 2019-05-26 DIAGNOSIS — IMO0002 Reserved for concepts with insufficient information to code with codable children: Secondary | ICD-10-CM | POA: Diagnosis present

## 2019-05-26 LAB — COMPREHENSIVE METABOLIC PANEL
ALT: 35 U/L (ref 0–44)
AST: 38 U/L (ref 15–41)
Albumin: 2.5 g/dL — ABNORMAL LOW (ref 3.5–5.0)
Alkaline Phosphatase: 54 U/L (ref 38–126)
Anion gap: 13 (ref 5–15)
BUN: 9 mg/dL (ref 6–20)
CO2: 26 mmol/L (ref 22–32)
Calcium: 8.5 mg/dL — ABNORMAL LOW (ref 8.9–10.3)
Chloride: 99 mmol/L (ref 98–111)
Creatinine, Ser: 0.81 mg/dL (ref 0.44–1.00)
GFR calc Af Amer: >60 mL/min (ref >60)
GFR calc non Af Amer: >60 mL/min (ref >60)
Glucose, Bld: 232 mg/dL — ABNORMAL HIGH (ref 70–99)
Potassium: 4.6 mmol/L (ref 3.5–5.1)
Sodium: 138 mmol/L (ref 135–145)
Total Bilirubin: 0.5 mg/dL (ref 0.3–1.2)
Total Protein: 6.7 g/dL (ref 6.5–8.1)

## 2019-05-26 LAB — CBC WITH DIFFERENTIAL/PLATELET
Abs Immature Granulocytes: 0.34 10*3/uL — ABNORMAL HIGH (ref 0.00–0.07)
Basophils Absolute: 0.1 10*3/uL (ref 0.0–0.1)
Basophils Relative: 1 %
Eosinophils Absolute: 0.1 10*3/uL (ref 0.0–0.5)
Eosinophils Relative: 0 %
HCT: 36.7 % (ref 36.0–46.0)
Hemoglobin: 11.1 g/dL — ABNORMAL LOW (ref 12.0–15.0)
Immature Granulocytes: 2 %
Lymphocytes Relative: 11 %
Lymphs Abs: 1.7 10*3/uL (ref 0.7–4.0)
MCH: 28.7 pg (ref 26.0–34.0)
MCHC: 30.2 g/dL (ref 30.0–36.0)
MCV: 94.8 fL (ref 80.0–100.0)
Monocytes Absolute: 0.6 10*3/uL (ref 0.1–1.0)
Monocytes Relative: 4 %
Neutro Abs: 12.3 10*3/uL — ABNORMAL HIGH (ref 1.7–7.7)
Neutrophils Relative %: 82 %
Platelets: 264 10*3/uL (ref 150–400)
RBC: 3.87 MIL/uL (ref 3.87–5.11)
RDW: 16.7 % — ABNORMAL HIGH (ref 11.5–15.5)
WBC: 15.1 10*3/uL — ABNORMAL HIGH (ref 4.0–10.5)
nRBC: 0.4 % — ABNORMAL HIGH (ref 0.0–0.2)

## 2019-05-26 LAB — RESPIRATORY PANEL BY RT PCR (FLU A&B, COVID)
Influenza A by PCR: NEGATIVE
Influenza B by PCR: NEGATIVE
SARS Coronavirus 2 by RT PCR: NEGATIVE

## 2019-05-26 LAB — CULTURE, BLOOD (ROUTINE X 2)
Culture: NO GROWTH
Culture: NO GROWTH
Special Requests: ADEQUATE
Special Requests: ADEQUATE

## 2019-05-26 LAB — HEMOGLOBIN A1C
Hgb A1c MFr Bld: 8.2 % — ABNORMAL HIGH (ref 4.8–5.6)
Mean Plasma Glucose: 188.64 mg/dL

## 2019-05-26 LAB — CBG MONITORING, ED: Glucose-Capillary: 153 mg/dL — ABNORMAL HIGH (ref 70–99)

## 2019-05-26 LAB — BRAIN NATRIURETIC PEPTIDE: B Natriuretic Peptide: 243 pg/mL — ABNORMAL HIGH (ref 0.0–100.0)

## 2019-05-26 LAB — PROCALCITONIN: Procalcitonin: 0.41 ng/mL

## 2019-05-26 LAB — POC SARS CORONAVIRUS 2 AG -  ED: SARS Coronavirus 2 Ag: NEGATIVE

## 2019-05-26 LAB — TROPONIN I (HIGH SENSITIVITY): Troponin I (High Sensitivity): 26 ng/L — ABNORMAL HIGH (ref <18)

## 2019-05-26 MED ORDER — SODIUM CHLORIDE 0.9 % IV SOLN
500.0000 mg | INTRAVENOUS | Status: DC
  Administered 2019-05-26 – 2019-05-27 (×2): 500 mg via INTRAVENOUS
  Filled 2019-05-26 (×3): qty 500

## 2019-05-26 MED ORDER — ACETAMINOPHEN 325 MG PO TABS
650.0000 mg | ORAL_TABLET | Freq: Four times a day (QID) | ORAL | Status: DC | PRN
  Administered 2019-05-28 – 2019-06-01 (×9): 650 mg via ORAL
  Filled 2019-05-26 (×10): qty 2

## 2019-05-26 MED ORDER — ALBUTEROL SULFATE HFA 108 (90 BASE) MCG/ACT IN AERS
2.0000 | INHALATION_SPRAY | Freq: Four times a day (QID) | RESPIRATORY_TRACT | Status: DC
  Administered 2019-05-26: 2 via RESPIRATORY_TRACT
  Filled 2019-05-26: qty 6.7

## 2019-05-26 MED ORDER — ALBUTEROL SULFATE (2.5 MG/3ML) 0.083% IN NEBU
2.5000 mg | INHALATION_SOLUTION | Freq: Four times a day (QID) | RESPIRATORY_TRACT | Status: DC | PRN
  Administered 2019-05-27 – 2019-05-30 (×4): 2.5 mg via RESPIRATORY_TRACT
  Filled 2019-05-26 (×2): qty 3

## 2019-05-26 MED ORDER — INSULIN ASPART 100 UNIT/ML ~~LOC~~ SOLN
0.0000 [IU] | Freq: Three times a day (TID) | SUBCUTANEOUS | Status: DC
  Administered 2019-05-27 (×3): 2 [IU] via SUBCUTANEOUS
  Administered 2019-05-28: 07:00:00 3 [IU] via SUBCUTANEOUS
  Administered 2019-05-28: 2 [IU] via SUBCUTANEOUS
  Administered 2019-05-28: 3 [IU] via SUBCUTANEOUS
  Administered 2019-05-29: 07:00:00 2 [IU] via SUBCUTANEOUS
  Administered 2019-05-29 (×2): 3 [IU] via SUBCUTANEOUS
  Administered 2019-05-30 (×3): 2 [IU] via SUBCUTANEOUS
  Administered 2019-05-31 (×2): 3 [IU] via SUBCUTANEOUS
  Administered 2019-06-01: 5 [IU] via SUBCUTANEOUS
  Administered 2019-06-01: 3 [IU] via SUBCUTANEOUS

## 2019-05-26 MED ORDER — SODIUM CHLORIDE 0.9 % IV SOLN
3.0000 g | Freq: Four times a day (QID) | INTRAVENOUS | Status: DC
  Administered 2019-05-27 – 2019-06-01 (×21): 3 g via INTRAVENOUS
  Filled 2019-05-26 (×6): qty 3
  Filled 2019-05-26: qty 8
  Filled 2019-05-26: qty 3
  Filled 2019-05-26: qty 8
  Filled 2019-05-26 (×4): qty 3
  Filled 2019-05-26: qty 8
  Filled 2019-05-26 (×4): qty 3
  Filled 2019-05-26: qty 8
  Filled 2019-05-26 (×2): qty 3
  Filled 2019-05-26: qty 8
  Filled 2019-05-26 (×2): qty 3

## 2019-05-26 MED ORDER — INSULIN ASPART 100 UNIT/ML ~~LOC~~ SOLN
0.0000 [IU] | Freq: Every day | SUBCUTANEOUS | Status: DC
  Administered 2019-05-27 – 2019-05-29 (×3): 2 [IU] via SUBCUTANEOUS
  Administered 2019-05-30 – 2019-05-31 (×2): 3 [IU] via SUBCUTANEOUS

## 2019-05-26 MED ORDER — ENOXAPARIN SODIUM 40 MG/0.4ML ~~LOC~~ SOLN
40.0000 mg | SUBCUTANEOUS | Status: DC
  Administered 2019-05-26 – 2019-05-31 (×6): 40 mg via SUBCUTANEOUS
  Filled 2019-05-26 (×6): qty 0.4

## 2019-05-26 MED ORDER — FUROSEMIDE 10 MG/ML IJ SOLN
20.0000 mg | Freq: Once | INTRAMUSCULAR | Status: AC
  Administered 2019-05-26: 20:00:00 20 mg via INTRAVENOUS
  Filled 2019-05-26: qty 2

## 2019-05-26 MED ORDER — ACETAMINOPHEN 650 MG RE SUPP: 650.0000 mg | Freq: Four times a day (QID) | RECTAL | Status: DC | PRN

## 2019-05-26 NOTE — ED Provider Notes (Signed)
[facility] EMERGENCY DEPARTMENT Provider Note   CSN: [id number] Arrival date & time: 05/26/19  1614     History Chief Complaint  Patient presents with  . Shortness of Breath  . hypoxia    Wanda Oneill is a 39 y.o. female.  HPI    Patient presents with her father, a semiretired physician who assists with the HPI. Patient has multiple medical issues including Down syndrome, recent shoulder surgery, and now presents with dyspnea. Patient has no history of pulmonology issues, nor cardiac issues, she does not typically wear home oxygen. 1 week ago, after elective right shoulder repair patient had persistent hypoxia on recovery, requiring supplemental oxygen, BiPAP, admission.  She notes that she went home with supplemental oxygen, but now over the past day or so has required increasing amounts of oxygen, from 4 L now 6 L to achieve appropriate saturation. Father notes the patient's skin was dusky earlier in the day, but now seems slightly better.  Patient herself denies pain other than shoulder discomfort, or abdominal pain with coughing, otherwise no persistent pain. No report of fever, no vomiting, no other new issues. Past Medical History:  Diagnosis Date  . Diabetes mellitus    type II  . Down syndrome   . Family history of adverse reaction to anesthesia    mom has had n/v  . Gastritis   . GERD (gastroesophageal reflux disease)   . Headache   . Hepatic steatosis   . Hidradenitis   . Hypothyroidism   . Irritable bowel syndrome   . Periumbilical hernia   . Pneumonia   . Restless legs   . Tendonitis    chronic in left foot  . Thyroid disease    hypothyroidism    Patient Active Problem List   Diagnosis Date Noted  . Acute respiratory failure with hypoxemia ([place]) 05/23/2019  . Acute hypoxemic respiratory failure ([place]) 05/20/2019  . Fibromyalgia 03/09/2019  . Neck pain, chronic 03/09/2019  . Acute shoulder bursitis, right 02/03/2019  . Bile  salt-induced diarrhea 09/25/2018  . Arthritis of left acromioclavicular joint 09/23/2018  . Precordial chest pain 08/11/2018  . Morbid obesity ([place]) 10/31/2017  . S/P right UKR, lateral 10/30/2017  . Incarcerated epigastric hernia 05/30/2015  . Left foot pain 08/09/2014  . Chronic diarrhea 07/27/2014  . Gout 08/10/2012  . Acid reflux 01/22/2012  . Down syndrome 11/11/2011  . Hernia of abdominal wall 10/21/2011  . Psoriasis 10/21/2011  . Type 2 diabetes mellitus, uncontrolled ([place]) 04/12/2011  . Hypothyroid 04/12/2011  . Gait abnormality 06/22/2010  . Tibial tendinitis, posterior 06/22/2010    Past Surgical History:  Procedure Laterality Date  . AXILLARY HIDRADENITIS EXCISION Bilateral   . CHOLECYSTECTOMY    . HERNIA REPAIR     multiple  . INCISIONAL HERNIA REPAIR  05/30/2015   LAPROSCOPIC  . INCISIONAL HERNIA REPAIR N/A 05/30/2015   Procedure: LAPAROSCOPIC INCISIONAL HERNIA;  Surgeon: Rolm Bookbinder, MD;  Location: [facility];  Service: General;  Laterality: N/A;  . INSERTION OF MESH N/A 05/30/2015   Procedure: INSERTION OF MESH;  Surgeon: Rolm Bookbinder, MD;  Location: [facility];  Service: General;  Laterality: N/A;  . KNEE ARTHROSCOPY     right knee  . LAPAROSCOPY N/A 05/30/2015   Procedure: LAPAROSCOPY DIAGNOSTIC;  Surgeon: Rolm Bookbinder, MD;  Location: [facility];  Service: General;  Laterality: N/A;  . PARTIAL KNEE ARTHROPLASTY Right 10/30/2017   Procedure: UNICOMPARTMENTAL RIGHT KNEE LATERAL;  Surgeon: Paralee Cancel, MD;  Location: [facility];  Service: Orthopedics;  Laterality: Right;  90 mins  . TONSILLECTOMY     adenoids     OB History   No obstetric history on file.     Family History  Problem Relation Age of Onset  . Thyroid cancer Mother   . Diabetes type II Mother   . High Cholesterol Mother   . Heart disease Mother   . Diabetes type II Father   . Hypertension Father   . Stroke Father     Social History   Tobacco Use  . Smoking status: Never Smoker  .  Smokeless tobacco: Never Used  Substance Use Topics  . Alcohol use: No    Alcohol/week: 0.0 standard drinks  . Drug use: No    Home Medications Prior to Admission medications   Medication Sig Start Date End Date Taking? Authorizing Provider  acetaminophen (TYLENOL) 325 MG tablet Take 650 mg by mouth every 6 (six) hours as needed for mild pain.     [provider]  allopurinol (ZYLOPRIM) 300 MG tablet Take 1 tablet (300 mg total) by mouth daily. 07/07/18   Copland, Gay Filler, MD  Blood Glucose Monitoring Suppl (BLOOD GLUCOSE METER KIT AND SUPPLIES) Dispense based on patient and insurance preference. To check blood sugars daily FOR ICD-9 250.00 Patient taking differently: 1 each by Other route See admin instructions. Dispense based on patient and insurance preference. To check blood sugars daily FOR ICD-9 250.00 08/31/13   Darlyne Russian, MD  cetirizine (ZYRTEC) 10 MG tablet Take 10 mg by mouth daily as needed for allergies.     [provider]  cholestyramine (QUESTRAN) 4 g packet DISSOLVE & TAKE 1 POWDER BY MOUTH TWICE DAILY Patient taking differently: Take 4 g by mouth 2 (two) times daily.  09/25/18   Zehr, Laban Emperor, PA-C  clindamycin (CLEOCIN) 300 MG capsule Take 300 mg by mouth every 6 (six) hours. 05/20/19   [provider]  clobetasol cream (TEMOVATE) 2.99 % Apply 1 application topically daily as needed (for eczema).  10/07/12   [provider]  dicyclomine (BENTYL) 20 MG tablet Take 1 tablet (20 mg total) by mouth 4 (four) times daily -  before meals and at bedtime. Patient taking differently: Take 20 mg by mouth 2 (two) times a day.  04/25/17   Copland, Gay Filler, MD  empagliflozin (JARDIANCE) 25 MG TABS tablet Take 25 mg by mouth daily.    [provider]  Glucose Blood (BLOOD GLUCOSE TEST STRIPS) STRP Use to test blood sugar up to twice a day Patient taking differently: 1 each by Other route See admin instructions. Use to test blood sugar up to  twice a day 07/07/18   Copland, Gay Filler, MD  JOLESSA 0.15-0.03 MG tablet Take 1 tablet by mouth once daily 02/02/19   Copland, Gay Filler, MD  levothyroxine (SYNTHROID) 137 MCG tablet TAKE 1 TABLET BY MOUTH IN THE MORNING . APPOINTMENT REQUIRED FOR FUTURE REFILLS Patient taking differently: Take 137 mcg by mouth every evening.  02/08/19   Copland, Gay Filler, MD  metFORMIN (GLUCOPHAGE) 1000 MG tablet Take 1,000 mg by mouth 2 (two) times daily.  01/16/16   [provider]  naproxen sodium (ALEVE) 220 MG tablet Take 220 mg by mouth daily as needed (pain).     [provider]  nystatin (MYCOSTATIN/NYSTOP) powder Apply 1 application topically 3 (three) times daily. Use as needed for candida intertrigo Patient taking differently: Apply 1 application topically 3 (three) times daily as needed (  Use as needed for candida intertrigo).  04/21/19   Copland, Gay Filler, MD  ondansetron (ZOFRAN) 4 MG tablet Take 4 mg by mouth every 8 (eight) hours as needed for nausea/vomiting. 05/20/19   [provider]  ONE TOUCH ULTRA TEST test strip USE TO CHECK BLOOD SUGAR ONCE DAILY (ICD CODE:25.00) Patient taking differently: 1 each by Other route daily.  05/08/13   Gale Journey, Damaris Hippo, PA-C  oxyCODONE (OXY IR/ROXICODONE) 5 MG immediate release tablet Take 5 mg by mouth every 4 (four) hours as needed for pain.    [provider]  Semaglutide,0.25 or 0.5MG/DOS, (OZEMPIC, 0.25 OR 0.5 MG/DOSE,) 2 MG/1.5ML SOPN Inject 0.5 mg into the skin once a week. Saturdays    [provider]    Allergies    Vancomycin, Cefuroxime, Cefuroxime axetil, and Sulfa antibiotics  Review of Systems   Review of Systems  Constitutional:       Per HPI, otherwise negative  HENT:       Per HPI, otherwise negative  Respiratory:       Per HPI, otherwise negative  Cardiovascular:       Per HPI, otherwise negative  Gastrointestinal: Negative for vomiting.  Endocrine:       Negative aside from HPI  Genitourinary:        Neg aside from HPI   Musculoskeletal:       Per HPI, otherwise negative  Skin: Negative.   Neurological: Negative for syncope.    Physical Exam Updated Vital Signs BP (!) 100/51   Pulse 83   Temp 98.7 F (37.1 C) (Oral)   Resp (!) 30   SpO2 94%   Physical Exam Vitals and nursing note reviewed.  Constitutional:      General: She is not in acute distress.    Appearance: She is well-developed.  HENT:     Head: Normocephalic and atraumatic.  Eyes:     Conjunctiva/sclera: Conjunctivae normal.  Cardiovascular:     Rate and Rhythm: Normal rate and regular rhythm.  Pulmonary:     Effort: No respiratory distress.     Breath sounds: No stridor. Decreased breath sounds present. No wheezing.     Comments: Decreased inspiratory effort Abdominal:     General: There is no distension.  Skin:    General: Skin is warm and dry.  Neurological:     Mental Status: She is alert and oriented to person, place, and time.     Cranial Nerves: No cranial nerve deficit.     ED Results / Procedures / Treatments   Labs (all labs ordered are listed, but only abnormal results are displayed) Labs Reviewed  COMPREHENSIVE METABOLIC PANEL - Abnormal; Notable for the following components:      Result Value   Glucose, Bld 232 (*)    Calcium 8.5 (*)    Albumin 2.5 (*)    All other components within normal limits  CBC WITH DIFFERENTIAL/PLATELET - Abnormal; Notable for the following components:   WBC 15.1 (*)    Hemoglobin 11.1 (*)    RDW 16.7 (*)    nRBC 0.4 (*)    Neutro Abs 12.3 (*)    Abs Immature Granulocytes 0.34 (*)    All other components within normal limits  BRAIN NATRIURETIC PEPTIDE - Abnormal; Notable for the following components:   B Natriuretic Peptide 243.0 (*)    All other components within normal limits  POC SARS CORONAVIRUS 2 AG -  ED    EKG EKG Interpretation  Date/Time:  Wednesday May 26 2019 16:14:17 EDT Ventricular Rate:  91 PR Interval:  142 QRS Duration:  80 QT Interval:  362 QTC Calculation: 445 R Axis:   -12 Text Interpretation: Normal sinus rhythm Anterior infarct , age undetermined Abnormal ECG Confirmed by Carmin Muskrat [phone]) on 05/26/2019 5:29:30 PM   Radiology DG Chest Port 1 View  Result Date: 05/26/2019 CLINICAL DATA:  39 year old female with shortness of breath. EXAM: PORTABLE CHEST 1 VIEW COMPARISON:  Chest radiograph dated 05/22/2019. FINDINGS: Diffuse bilateral and lower lung field predominant streaky densities relatively similar to prior radiograph and may represent atelectasis or atypical infection. Clinical correlation is recommended. Probable trace bilateral pleural effusions. No lobar consolidation, or pneumothorax. Top-normal cardiac silhouette. No acute osseous pathology. IMPRESSION: Diffuse bilateral and lower lung field predominant streaky densities may represent atelectasis or atypical infection. Electronically Signed   By: Anner Crete M.D.   On: 05/26/2019 17:53    Procedures Procedures (including critical care time)  Medications Ordered in ED Medications  albuterol (VENTOLIN HFA) 108 (90 Base) MCG/ACT inhaler 2 puff (2 puffs Inhalation Given 05/26/19 1932)  furosemide (LASIX) injection 20 mg (has no administration in time range)    ED Course  I have reviewed the triage vital signs and the nursing notes.  Pertinent labs & imaging results that were available during my care of the patient were reviewed by me and considered in my medical decision making (see chart for details).    MDM Rules/Calculators/A&P                      7:39 PM Patient continues to require oxygen, 6 L via nasal cannula for appropriate saturation. All results were reviewed, and I discussed with the patient and her father, the physician. We discussed her bilateral opacifications, concern for atelectasis versus atypical infection.  Patient has had 2 Covid vaccines, and point-of-care testing today is negative. Patient continues to  require oxygen, and with elevated BNP there is some suspicion for heart failure contributing to today's evaluation, this may be secondary to a viral process that is underlying her presentation. Given her new oxygen requirement, hypoxia, in the context of no prior oxygen needs, the patient will require admission for further monitoring, management.  Patient, father both aware of this.  Patient received initial diuretics, continued oxygen while in the ED. Final Clinical Impression(s) / ED Diagnoses Final diagnoses:  Hypoxia     Carmin Muskrat, MD 05/26/19 1940

## 2019-05-26 NOTE — Progress Notes (Signed)
[employer] is the DME and not saturating welll on nasal 02 max flow  She needs humidity and a ventimask as her nose is getting irritated on nasal 02   Needs these asap today and if not improving was instructed to go to ER for bipap

## 2019-05-26 NOTE — H&P (Addendum)
History and Physical    Wanda Oneill TMH:[id number] DOB: 1980-07-16 DOA: 05/26/2019  PCP: Darreld Mclean, MD Patient coming from: Home  Chief Complaint: Shortness of breath, hypoxia  HPI: Wanda Oneill is a 39 y.o. female with medical history significant of Down syndrome, non-insulin-dependent type 2 diabetes, GERD, gastritis, hepatic steatosis, gout, hypothyroidism, IBS, morbid obesity (BMI 44.24) presenting to the ED for evaluation of shortness of breath and hypoxia. patient was recently admitted to the hospital from 4/15-4/18 under pulmonary critical care service.  She is status post both doses of COVID-19 vaccine.  She was admitted for acute hypoxic respiratory failure in the setting of recent shoulder surgery for which she received interscalene block.  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  Chest radiograph revealed an elevated right hemidiaphragm.  She required BiPAP initially and then gradually weaned down to 4 L supplemental oxygen.  Patient's mother at bedside states she is a Marine scientist and the patient's father is a Engineer, drilling.  Soon after her hospital discharge the patient was doing okay on 2 L supplemental oxygen but then started becoming hypoxic with pulse ox in the 60s.  Her oxygen requirement has increased over the past few days and today despite being on 6 L supplemental oxygen she was still desatted to the 70s.  She has had increasing shortness of breath.  No fevers, chills, cough, or chest pain.  No orthopnea or lower extremity edema.  Patient has already received both doses of her Covid vaccine.  The second dose was about 3 weeks ago.  ED Course: Requiring 6L supplemental oxygen.  Tachypneic.  Not tachycardic.  Afebrile.  Labs showing WBC count 15.1.  Hemoglobin 11.1, stable compared to labs done during recent hospitalization.  Blood glucose 232.  Bicarb 26, anion gap 13.  BNP 243,  was 34 on labs done 6 days ago.  SARS-CoV-2 rapid antigen test negative.  Chest x-ray showing diffuse bilateral and lower lung field predominant streaky densities which may represent atelectasis or atypical infection.  Patient received albuterol and IV Lasix 20 mg.  Review of Systems:  All systems reviewed and apart from history of presenting illness, are negative.  Past Medical History:  Diagnosis Date  . Diabetes mellitus    type II  . Down syndrome   . Family history of adverse reaction to anesthesia    mom has had n/v  . Gastritis   . GERD (gastroesophageal reflux disease)   . Headache   . Hepatic steatosis   . Hidradenitis   . Hypothyroidism   . Irritable bowel syndrome   . Periumbilical hernia   . Pneumonia   . Restless legs   . Tendonitis    chronic in left foot  . Thyroid disease    hypothyroidism    Past Surgical History:  Procedure Laterality Date  . AXILLARY HIDRADENITIS EXCISION Bilateral   . CHOLECYSTECTOMY    . HERNIA REPAIR     multiple  . INCISIONAL HERNIA REPAIR  05/30/2015   LAPROSCOPIC  . INCISIONAL HERNIA REPAIR N/A 05/30/2015   Procedure: LAPAROSCOPIC INCISIONAL HERNIA;  Surgeon: Rolm Bookbinder, MD;  Location: [facility];  Service: General;  Laterality: N/A;  . INSERTION OF MESH N/A 05/30/2015   Procedure: INSERTION OF MESH;  Surgeon: Rolm Bookbinder, MD;  Location: [facility];  Service: General;  Laterality: N/A;  . KNEE ARTHROSCOPY     right knee  . LAPAROSCOPY N/A 05/30/2015  Procedure: LAPAROSCOPY DIAGNOSTIC;  Surgeon: Rolm Bookbinder, MD;  Location: [facility];  Service: General;  Laterality: N/A;  . PARTIAL KNEE ARTHROPLASTY Right 10/30/2017   Procedure: UNICOMPARTMENTAL RIGHT KNEE LATERAL;  Surgeon: Paralee Cancel, MD;  Location: [facility];  Service: Orthopedics;  Laterality: Right;  90 mins  . TONSILLECTOMY     adenoids     reports that she has never smoked. She has never used smokeless tobacco. She reports that she does not drink alcohol or use  drugs.  Allergies  Allergen Reactions  . Vancomycin Itching  . Cefuroxime Hives and Swelling    Unspecified location of swelling  . Cefuroxime Axetil Hives  . Sulfa Antibiotics Hives    Family History  Problem Relation Age of Onset  . Thyroid cancer Mother   . Diabetes type II Mother   . High Cholesterol Mother   . Heart disease Mother   . Diabetes type II Father   . Hypertension Father   . Stroke Father     Prior to Admission medications   Medication Sig Start Date End Date Taking? Authorizing Provider  acetaminophen (TYLENOL) 325 MG tablet Take 650 mg by mouth every 6 (six) hours as needed for mild pain.     [provider]  allopurinol (ZYLOPRIM) 300 MG tablet Take 1 tablet (300 mg total) by mouth daily. 07/07/18   Copland, Gay Filler, MD  Blood Glucose Monitoring Suppl (BLOOD GLUCOSE METER KIT AND SUPPLIES) Dispense based on patient and insurance preference. To check blood sugars daily FOR ICD-9 250.00 Patient taking differently: 1 each by Other route See admin instructions. Dispense based on patient and insurance preference. To check blood sugars daily FOR ICD-9 250.00 08/31/13   Darlyne Russian, MD  cetirizine (ZYRTEC) 10 MG tablet Take 10 mg by mouth daily as needed for allergies.     [provider]  cholestyramine (QUESTRAN) 4 g packet DISSOLVE & TAKE 1 POWDER BY MOUTH TWICE DAILY Patient taking differently: Take 4 g by mouth 2 (two) times daily.  09/25/18   Zehr, Laban Emperor, PA-C  clindamycin (CLEOCIN) 300 MG capsule Take 300 mg by mouth every 6 (six) hours. 05/20/19   [provider]  clobetasol cream (TEMOVATE) 7.42 % Apply 1 application topically daily as needed (for eczema).  10/07/12   [provider]  dicyclomine (BENTYL) 20 MG tablet Take 1 tablet (20 mg total) by mouth 4 (four) times daily -  before meals and at bedtime. Patient taking differently: Take 20 mg by mouth 2 (two) times a day.  04/25/17   Copland, Gay Filler, MD  empagliflozin  (JARDIANCE) 25 MG TABS tablet Take 25 mg by mouth daily.    [provider]  Glucose Blood (BLOOD GLUCOSE TEST STRIPS) STRP Use to test blood sugar up to twice a day Patient taking differently: 1 each by Other route See admin instructions. Use to test blood sugar up to twice a day 07/07/18   Copland, Gay Filler, MD  JOLESSA 0.15-0.03 MG tablet Take 1 tablet by mouth once daily 02/02/19   Copland, Gay Filler, MD  levothyroxine (SYNTHROID) 137 MCG tablet TAKE 1 TABLET BY MOUTH IN THE MORNING . APPOINTMENT REQUIRED FOR FUTURE REFILLS Patient taking differently: Take 137 mcg by mouth every evening.  02/08/19   Copland, Gay Filler, MD  metFORMIN (GLUCOPHAGE) 1000 MG tablet Take 1,000 mg by mouth 2 (two) times daily.  01/16/16   [provider]  naproxen sodium (ALEVE) 220 MG tablet Take 220 mg by  mouth daily as needed (pain).     [provider]  nystatin (MYCOSTATIN/NYSTOP) powder Apply 1 application topically 3 (three) times daily. Use as needed for candida intertrigo Patient taking differently: Apply 1 application topically 3 (three) times daily as needed (Use as needed for candida intertrigo).  04/21/19   Copland, Gay Filler, MD  ondansetron (ZOFRAN) 4 MG tablet Take 4 mg by mouth every 8 (eight) hours as needed for nausea/vomiting. 05/20/19   [provider]  ONE TOUCH ULTRA TEST test strip USE TO CHECK BLOOD SUGAR ONCE DAILY (ICD CODE:25.00) Patient taking differently: 1 each by Other route daily.  05/08/13   Gale Journey, Damaris Hippo, PA-C  oxyCODONE (OXY IR/ROXICODONE) 5 MG immediate release tablet Take 5 mg by mouth every 4 (four) hours as needed for pain.    [provider]  Semaglutide,0.25 or 0.5MG/DOS, (OZEMPIC, 0.25 OR 0.5 MG/DOSE,) 2 MG/1.5ML SOPN Inject 0.5 mg into the skin once a week. Saturdays    [provider]    Physical Exam: Vitals:   05/26/19 1900 05/26/19 1930 05/26/19 2000 05/26/19 2030  BP: (!) 111/97 (!) 100/51 102/79 100/87  Pulse: 87 83 92  88  Resp: 15 (!) 30 (!) 25 (!) 25  Temp:      TempSrc:      SpO2: 96% 94% 96% 95%    Physical Exam  Constitutional: She is oriented to person, place, and time. She appears well-developed and well-nourished. No distress.  HENT:  Head: Normocephalic.  Eyes: Right eye exhibits no discharge. Left eye exhibits no discharge.  Cardiovascular: Normal rate, regular rhythm and intact distal pulses.  Pulmonary/Chest: She has no wheezes.  Tachypneic with respiratory rate up to 30 Coarse breath sounds at the bases Satting in the mid 90s on 6 L supplemental oxygen  Abdominal: Soft. Bowel sounds are normal. There is no abdominal tenderness. There is no guarding.  Musculoskeletal:        General: No edema.     Cervical back: Neck supple.  Neurological: She is alert and oriented to person, place, and time.  Skin: Skin is warm and dry. She is not diaphoretic.     Labs on Admission: I have personally reviewed following labs and imaging studies  CBC: Recent Labs  Lab 05/20/19 1813 05/20/19 2032 05/21/19 0610 05/22/19 0336 05/26/19 1754  WBC 13.1*  --  11.2* 9.4 15.1*  NEUTROABS 11.4*  --   --   --  12.3*  HGB 13.5 14.3 11.8* 11.1* 11.1*  HCT 44.8 42.0 39.0 36.1 36.7  MCV 96.3  --  95.8 93.3 94.8  PLT 322  --  258 229 939   Basic Metabolic Panel: Recent Labs  Lab 05/20/19 1813 05/20/19 1813 05/20/19 2032 05/21/19 0610 05/22/19 0336 05/23/19 0607 05/26/19 1754  NA 137   < > 138 140 137 137 138  K 6.2*   < > 4.9 4.4 4.0 4.2 4.6  CL 105   < > 107 106 103 101 99  CO2 17*  --   --  21* _0 GLUCOSE 248*   < > 229* 181* 190* 178* 232*  BUN 13   < > _1 CREATININE 0.93   < > 0.60 0.69 0.64 0.58 0.81  CALCIUM 8.0*  --   --  8.0* 8.0* 8.3* 8.5*  MG  --   --   --  1.7  --   --   --   PHOS  --   --   --  3.3  --   --   --    < > = values in this interval not displayed.   GFR: Estimated Creatinine Clearance: 117.2 mL/min (by C-G formula based on SCr of 0.81  mg/dL). Liver Function Tests: Recent Labs  Lab 05/26/19 1754  AST 38  ALT 35  ALKPHOS 54  BILITOT 0.5  PROT 6.7  ALBUMIN 2.5*   No results for input(s): LIPASE, AMYLASE in the last 168 hours. No results for input(s): AMMONIA in the last 168 hours. Coagulation Profile: No results for input(s): INR, PROTIME in the last 168 hours. Cardiac Enzymes: No results for input(s): CKTOTAL, CKMB, CKMBINDEX, TROPONINI in the last 168 hours. BNP (last 3 results) No results for input(s): PROBNP in the last 8760 hours. HbA1C: No results for input(s): HGBA1C in the last 72 hours. CBG: Recent Labs  Lab 05/22/19 0719 05/22/19 1124 05/22/19 1636 05/22/19 2147 05/23/19 0756  GLUCAP 173* 225* 210* 227* 185*   Lipid Profile: No results for input(s): CHOL, HDL, LDLCALC, TRIG, CHOLHDL, LDLDIRECT in the last 72 hours. Thyroid Function Tests: No results for input(s): TSH, T4TOTAL, FREET4, T3FREE, THYROIDAB in the last 72 hours. Anemia Panel: No results for input(s): VITAMINB12, FOLATE, FERRITIN, TIBC, IRON, RETICCTPCT in the last 72 hours. Urine analysis:    Component Value Date/Time   COLORURINE YELLOW 10/14/2007 1130   APPEARANCEUR CLEAR 10/14/2007 1130   LABSPEC 1.012 10/14/2007 1130   PHURINE 7.0 10/14/2007 1130   GLUCOSEU NEGATIVE 10/14/2007 1130   HGBUR NEGATIVE 10/14/2007 1130   BILIRUBINUR 5 12/21/2012 1231   KETONESUR NEGATIVE 10/14/2007 1130   PROTEINUR 30 12/21/2012 1231   PROTEINUR NEGATIVE 10/14/2007 1130   UROBILINOGEN 0.2 12/21/2012 1231   UROBILINOGEN 0.2 10/14/2007 1130   NITRITE neg 12/21/2012 1231   NITRITE NEGATIVE 10/14/2007 1130   LEUKOCYTESUR Negative 12/21/2012 1231    Radiological Exams on Admission: DG Chest Port 1 View  Result Date: 05/26/2019 CLINICAL DATA:  39 year old female with shortness of breath. EXAM: PORTABLE CHEST 1 VIEW COMPARISON:  Chest radiograph dated 05/22/2019. FINDINGS: Diffuse bilateral and lower lung field predominant streaky densities  relatively similar to prior radiograph and may represent atelectasis or atypical infection. Clinical correlation is recommended. Probable trace bilateral pleural effusions. No lobar consolidation, or pneumothorax. Top-normal cardiac silhouette. No acute osseous pathology. IMPRESSION: Diffuse bilateral and lower lung field predominant streaky densities may represent atelectasis or atypical infection. Electronically Signed   By: Anner Crete M.D.   On: 05/26/2019 17:53    EKG: Independently reviewed.  Sinus rhythm, slight T wave abnormalities in anterior leads new compared to prior tracing.  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 Currently satting in the mid 90s on 6 L supplemental oxygen but continues to be tachypneic with respiratory rate up to 30.  Per family, she desatted to the 47s on 6 L supplemental oxygen at home.  Chest x-ray showing diffuse bilateral and lower lung field predominant streaky densities which may represent atelectasis or atypical infection.  COVID-19 viral infection less likely as patient has already received both doses of her vaccine and SARS-CoV-2 rapid antigen test done today is negative.  Bacterial pneumonia is a possibility as she  does have leukocytosis on labs.  In addition, CHF/pulmonary edema is also possibility as BNP elevated today and was normal on labs done 6 days ago. -Start Unasyn and azithromycin.  MRSA PCR done during recent hospitalization negative.  Check procalcitonin level, strep pneumo urinary antigen, and continue to monitor WBC count.  Order respiratory viral panel and SARS-CoV-2 PCR test.     -Patient received IV Lasix 20 mg.  Order echocardiogram.   -Continuous pulse ox, continue supplemental oxygen, incentive spirometry. -Stat ABG Addendum: Blood culture x2 also ordered  Hyperglycemia in the setting of noninsulin-dependent type 2 diabetes: Blood glucose 232.  No signs of DKA.  Check A1c.  Order sliding scale insulin sensitive ACHS and CBG checks.  Abnormal EKG: EKG with slight T wave abnormalities in anterior leads which appear new compared to prior tracing.  Possibly due to demand ischemia given elevated BNP.  Patient denies chest pain.  Continue cardiac monitoring and check high-sensitivity troponin level.  Gout: Resume home allopurinol after pharmacy med rec is done.  Hypothyroidism: Resume home Synthroid after pharmacy med rec is done  DVT prophylaxis: Lovenox Code Status: Full code Family Communication: Mother at bedside. Disposition Plan: Anticipate discharge after clinical improvement. Admission status: It is my clinical opinion that admission to INPATIENT is reasonable and necessary because of the expectation that this patient will require hospital care that crosses at least 2 midnights to treat this condition based on the medical complexity of the problems presented.  Given the aforementioned information, the predictability of an adverse outcome is felt to be significant.  The medical decision making on this patient was of high complexity and the patient is at high risk for clinical deterioration, therefore this is a level 3 visit.  Shela Leff MD [facility]  If 7PM-7AM, please contact night-coverage [email or web address]  05/26/2019, 9:23 PM

## 2019-05-26 NOTE — Progress Notes (Signed)
Pharmacy Antibiotic Note  Wanda Oneill is a 39 y.o. female admitted on 05/26/2019 with pneumonia.  Pharmacy has been consulted for Unasyn dosing.  CXR shows diffuse bilateral lower lung densities. WBC 15.1, afebrile, Scr 0.81. Pt has cefuroxime allergy listed but has tolerated Augment in the past.  Plan: - Start Unasyn 3g IV Q6 hrs - Will follow peripherally. Please re-consult pharmacy for any antibiotic needs.     Temp (24hrs), Avg:98.7 F (37.1 C), Min:98.7 F (37.1 C), Max:98.7 F (37.1 C)  Recent Labs  Lab 05/20/19 1813 05/20/19 1813 05/20/19 2032 05/21/19 0610 05/22/19 0336 05/23/19 0607 05/26/19 1754  WBC 13.1*  --   --  11.2* 9.4  --  15.1*  CREATININE 0.93   < > 0.60 0.69 0.64 0.58 0.81   < > = values in this interval not displayed.    Estimated Creatinine Clearance: 117.2 mL/min (by C-G formula based on SCr of 0.81 mg/dL).    Allergies  Allergen Reactions  . Vancomycin Itching  . Cefuroxime Hives and Swelling    Unspecified location of swelling  . Cefuroxime Axetil Hives  . Sulfa Antibiotics Hives    Antimicrobials this admission: Unasyn 4/21 >>   Dose adjustments this admission: N/A  Microbiology results:   Richardine Service, PharmD PGY1 Pharmacy Resident Phone: [phone] 05/26/2019  9:15 PM  Please check [email or web address] for unit-specific pharmacy phone numbers.

## 2019-05-26 NOTE — ED Triage Notes (Signed)
Pt accompanied by father who reports hypoxia and increased sob since pt was discharged from hospital last week. Pt sent home on 4L [place] but has been satting in the 80s so they have had to bump up her oxygen to 6L. Pt alert, no resp distress noted at this time.

## 2019-05-27 ENCOUNTER — Encounter (HOSPITAL_COMMUNITY): Payer: Self-pay | Admitting: Internal Medicine

## 2019-05-27 ENCOUNTER — Inpatient Hospital Stay (HOSPITAL_COMMUNITY): Payer: Medicare HMO

## 2019-05-27 DIAGNOSIS — I5031 Acute diastolic (congestive) heart failure: Secondary | ICD-10-CM

## 2019-05-27 LAB — CBG MONITORING, ED
Glucose-Capillary: 143 mg/dL — ABNORMAL HIGH (ref 70–99)
Glucose-Capillary: 166 mg/dL — ABNORMAL HIGH (ref 70–99)
Glucose-Capillary: 168 mg/dL — ABNORMAL HIGH (ref 70–99)

## 2019-05-27 LAB — CBC
HCT: 35.9 % — ABNORMAL LOW (ref 36.0–46.0)
Hemoglobin: 10.9 g/dL — ABNORMAL LOW (ref 12.0–15.0)
MCH: 28.7 pg (ref 26.0–34.0)
MCHC: 30.4 g/dL (ref 30.0–36.0)
MCV: 94.5 fL (ref 80.0–100.0)
Platelets: 275 10*3/uL (ref 150–400)
RBC: 3.8 MIL/uL — ABNORMAL LOW (ref 3.87–5.11)
RDW: 16.6 % — ABNORMAL HIGH (ref 11.5–15.5)
WBC: 10.8 10*3/uL — ABNORMAL HIGH (ref 4.0–10.5)
nRBC: 0.9 % — ABNORMAL HIGH (ref 0.0–0.2)

## 2019-05-27 LAB — POCT I-STAT 7, (LYTES, BLD GAS, ICA,H+H)
Acid-Base Excess: 5 mmol/L — ABNORMAL HIGH (ref 0.0–2.0)
Bicarbonate: 30.2 mmol/L — ABNORMAL HIGH (ref 20.0–28.0)
Calcium, Ion: 1.15 mmol/L (ref 1.15–1.40)
HCT: 34 % — ABNORMAL LOW (ref 36.0–46.0)
Hemoglobin: 11.6 g/dL — ABNORMAL LOW (ref 12.0–15.0)
O2 Saturation: 95 %
Patient temperature: 98.2
Potassium: 4 mmol/L (ref 3.5–5.1)
Sodium: 137 mmol/L (ref 135–145)
TCO2: 32 mmol/L (ref 22–32)
pCO2 arterial: 47.5 mmHg (ref 32.0–48.0)
pH, Arterial: 7.411 (ref 7.350–7.450)
pO2, Arterial: 78 mmHg — ABNORMAL LOW (ref 83.0–108.0)

## 2019-05-27 LAB — TROPONIN I (HIGH SENSITIVITY): Troponin I (High Sensitivity): 17 ng/L (ref <18)

## 2019-05-27 LAB — ECHOCARDIOGRAM COMPLETE

## 2019-05-27 LAB — STREP PNEUMONIAE URINARY ANTIGEN: Strep Pneumo Urinary Antigen: NEGATIVE

## 2019-05-27 LAB — GLUCOSE, CAPILLARY
Glucose-Capillary: 162 mg/dL — ABNORMAL HIGH (ref 70–99)
Glucose-Capillary: 238 mg/dL — ABNORMAL HIGH (ref 70–99)

## 2019-05-27 MED ORDER — LEVOTHYROXINE SODIUM 25 MCG PO TABS
137.0000 ug | ORAL_TABLET | Freq: Every day | ORAL | Status: DC
  Administered 2019-05-27 – 2019-05-31 (×5): 137 ug via ORAL
  Filled 2019-05-27 (×5): qty 1

## 2019-05-27 MED ORDER — ALLOPURINOL 300 MG PO TABS
300.0000 mg | ORAL_TABLET | Freq: Every day | ORAL | Status: DC
  Administered 2019-05-27 – 2019-06-01 (×6): 300 mg via ORAL
  Filled 2019-05-27 (×6): qty 1

## 2019-05-27 NOTE — ED Notes (Signed)
Pt 85-87% stats with mouth closed and breathing through nose, good pleth. RT and Dr. Marlowe Sax contacted. Per order put on 15 L non rebreather.

## 2019-05-27 NOTE — Progress Notes (Signed)
PROGRESS NOTE  Wanda Oneill  DOB: 1980-12-05  PCP: Darreld Mclean, MD [id number]  DOA: 05/26/2019  LOS: 1 day   Chief Complaint  Patient presents with  . Shortness of Breath  . hypoxia   Brief narrative: Wanda Oneill is a 39 y.o. female with PMH significant for Down syndrome, non-insulin-dependent type 2 diabetes, GERD, gastritis, hepatic steatosis, gout, hypothyroidism, IBS, morbid obesity (BMI 44.24). Patient presented to the ED for shortness of breath, hypoxia. 4/15, patient underwent elective bone spur shaving of the right shoulder as an outpatient under interscalene block.  Since then, patient started having drop in oxygen saturation and was brought to the ED post procedure.  Chest x-ray is showed elevated right hemidiaphragm.  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  She required high flow oxygen therapy and was subsequently discharged home on 2 L oxygen by nasal cannula on 4/18.  She is status post both doses of COVID-19 vaccine, second dose 3 weeks ago.  Soon after her hospital discharge the patient was doing okay on 2 L supplemental oxygen but then again, she started becoming hypoxic with pulse ox in the 60s.  Her oxygen requirement has increased over the past few days. 4/21, despite being on 6 L supplemental oxygen, patient's oxygen saturation continued to drop to 70s.  She started being more short of breath and hence was brought to the ED.  In the ED, patient required 6 L oxygen by nasal cannula.  She was tachypneic.  Not tachycardic.  WBC count elevated to 15.1.  Blood glucose 232.   Covid antigen negative. Chest x-ray showing diffuse bilateral and lower lung field predominant streaky densities which may represent atelectasis or atypical infection. Patient received albuterol and IV Lasix 20 mg. Patient was admitted under hospitalist service for further evaluation  management  Subjective: Patient was seen and examined this afternoon in the ED.  Young obese Caucasian female.  Feels better than at presentation. On 2 L oxygen by nasal cannula.   Assessment/Plan: Acute hypoxic respiratory failure recent hospitalization for acute hypoxic respiratory failure secondary to phrenic nerve paralysis.   -Required 6 L oxygen yesterday on admission.  Currently on 4 L.   -Chest x-ray showed diffuse bilateral and lower lung field predominant streaky densities which may represent atelectasis or atypical infection.  COVID-19 viral infection less likely as patient has already received both doses of her vaccine and SARS-CoV-2 rapid antigen test as well as respiratory virus panel is negative.   -Other potential causes : Bacterial pneumonia versus CHF.   -Currently on IV Unasyn and azithromycin.  Procalcitonin level was elevated to 0.41. -Echocardiogram pending. -Received Lasix 20 mg IV 1 dose earlier. -Continue oxygen monitoring. -Continuous pulse ox, continue supplemental oxygen, incentive spirometry.  Hyperglycemia Noninsulin-dependent type 2 diabetes: A1c 8.2 on 4/21 -Blood glucose 232.  No signs of DKA.   -Home meds include Metformin, Ozempic, Jardiance.  -Currently on sliding scale insulin with Accu-Cheks.  Gout: Resume home allopurinol after pharmacy med rec is done.  Hypothyroidism: Resume home Synthroid after pharmacy med rec is done  DVT prophylaxis:  Lovenox subcu Antimicrobials:  Unasyn and azithromycin Fluid: Not on IV fluids Diet: Medium carb diet  Consultants: None Family Communication:  Called and updated patient's parents this afternoon.  Status is: Inpatient  Remains inpatient appropriate because:IV treatments appropriate due to intensity of illness or inability to take PO   Dispo: The patient is from: Home  Anticipated d/c is to: Home              Anticipated d/c date is: 1 day              Patient currently is not  medically stable to d/c.  Antimicrobials: Anti-infectives (From admission, onward)   Start     Dose/Rate Route Frequency Ordered Stop   05/26/19 2115  azithromycin (ZITHROMAX) 500 mg in sodium chloride 0.9 % 250 mL IVPB     500 mg 250 mL/hr over 60 Minutes Intravenous Every 24 hours 05/26/19 2101     05/26/19 2115  Ampicillin-Sulbactam (UNASYN) 3 g in sodium chloride 0.9 % 100 mL IVPB     3 g 200 mL/hr over 30 Minutes Intravenous Every 6 hours 05/26/19 2113          Code Status: Full Code   Diet Order            Diet Carb Modified Fluid consistency: Thin; Room service appropriate? Yes  Diet effective now              Infusions:  . ampicillin-sulbactam (UNASYN) IV Stopped (05/27/19 1320)  . azithromycin Stopped (05/26/19 2216)    Scheduled Meds: . enoxaparin (LOVENOX) injection  40 mg Subcutaneous Q24H  . insulin aspart  0-5 Units Subcutaneous QHS  . insulin aspart  0-9 Units Subcutaneous TID WC    PRN meds: acetaminophen **OR** acetaminophen, albuterol   Objective: Vitals:   05/27/19 1130 05/27/19 1300  BP:    Pulse:    Resp:    Temp:    SpO2: 97% 95%   No intake or output data in the 24 hours ending 05/27/19 1355 There were no vitals filed for this visit. Weight change:  There is no height or weight on file to calculate BMI.   Physical Exam: General exam: Appears calm and comfortable.  Skin: No rashes, lesions or ulcers. HEENT: Atraumatic, normocephalic, supple neck, no obvious bleeding Lungs: Minimal basilar crackles CVS: Regular rate and rhythm, no murmur GI/Abd soft, nontender, distended from obesity, bowel sound present CNS: Alert, awake, baseline issues with Down syndrome. Psychiatry: Mood appropriate Extremities: No pedal edema, no calf tenderness  Data Review: I have personally reviewed the laboratory data and studies available.  Recent Labs  Lab 05/20/19 1813 05/20/19 2032 05/21/19 0610 05/21/19 0610 05/22/19 0336 05/26/19 1754  05/26/19 2142 05/27/19 0057 05/27/19 0431  WBC 13.1*  --  11.2*  --  9.4 15.1*  --   --  10.8*  NEUTROABS 11.4*  --   --   --   --  12.3*  --   --   --   HGB 13.5   < > 11.8*   < > 11.1* 11.1* 13.3 11.6* 10.9*  HCT 44.8   < > 39.0   < > 36.1 36.7 39.0 34.0* 35.9*  MCV 96.3  --  95.8  --  93.3 94.8  --   --  94.5  PLT 322  --  258  --  229 264  --   --  275   < > = values in this interval not displayed.   Recent Labs  Lab 05/20/19 1813 05/20/19 1813 05/20/19 2032 05/20/19 2032 05/21/19 0610 05/21/19 0610 05/22/19 0336 05/23/19 0607 05/26/19 1754 05/26/19 2142 05/27/19 0057  NA 137   < > 138   < > 140   < > 137 137 138 137 137  K 6.2*   < > 4.9   < >  4.4   < > 4.0 4.2 4.6 4.1 4.0  CL 105   < > 107  --  106  --  103 101 99  --   --   CO2 17*  --   --   --  21*  --  24 23 26   --   --   GLUCOSE 248*   < > 229*  --  181*  --  190* 178* 232*  --   --   BUN 13   < > 16  --  11  --  9 6 9   --   --   CREATININE 0.93   < > 0.60  --  0.69  --  0.64 0.58 0.81  --   --   CALCIUM 8.0*  --   --   --  8.0*  --  8.0* 8.3* 8.5*  --   --   MG  --   --   --   --  1.7  --   --   --   --   --   --   PHOS  --   --   --   --  3.3  --   --   --   --   --   --    < > = values in this interval not displayed.    Signed, Terrilee Croak, MD [facility] Pager: [phone] (Secure Chat preferred). 05/27/2019

## 2019-05-27 NOTE — ED Notes (Signed)
Lunch Tray Ordered @ 1043. 

## 2019-05-27 NOTE — Progress Notes (Signed)
Pt received from ED. VSS. CHG complete. Pt and family oriented to room and unit. Pt mother at bedside. Call light in reach.  Clyde Canterbury, RN

## 2019-05-27 NOTE — Progress Notes (Signed)
  Echocardiogram 2D Echocardiogram has been performed.  Michiel Cowboy 05/27/2019, 3:35 PM

## 2019-05-27 NOTE — ED Notes (Signed)
SDU  bfast ordered  

## 2019-05-27 NOTE — ED Notes (Signed)
Paged dr Marlowe Sax to RN Joe--Leslie

## 2019-05-27 NOTE — ED Notes (Signed)
Checked on pt, she was sleeping well and vitals wdl.

## 2019-05-27 NOTE — ED Notes (Signed)
Physically checked on Pt. She was sleeping well and vitals WDL

## 2019-05-28 ENCOUNTER — Inpatient Hospital Stay (HOSPITAL_COMMUNITY): Payer: Medicare HMO

## 2019-05-28 DIAGNOSIS — J9601 Acute respiratory failure with hypoxia: Secondary | ICD-10-CM

## 2019-05-28 DIAGNOSIS — J690 Pneumonitis due to inhalation of food and vomit: Principal | ICD-10-CM

## 2019-05-28 LAB — RESPIRATORY PANEL BY PCR

## 2019-05-28 LAB — BASIC METABOLIC PANEL
Anion gap: 13 (ref 5–15)
BUN: 7 mg/dL (ref 6–20)
CO2: 28 mmol/L (ref 22–32)
Calcium: 8.4 mg/dL — ABNORMAL LOW (ref 8.9–10.3)
Chloride: 98 mmol/L (ref 98–111)
Creatinine, Ser: 0.64 mg/dL (ref 0.44–1.00)
GFR calc Af Amer: >60 mL/min (ref >60)
GFR calc non Af Amer: >60 mL/min (ref >60)
Glucose, Bld: 178 mg/dL — ABNORMAL HIGH (ref 70–99)
Potassium: 3.7 mmol/L (ref 3.5–5.1)
Sodium: 139 mmol/L (ref 135–145)

## 2019-05-28 LAB — CBC WITH DIFFERENTIAL/PLATELET
Abs Immature Granulocytes: 0.59 10*3/uL — ABNORMAL HIGH (ref 0.00–0.07)
Basophils Absolute: 0.1 10*3/uL (ref 0.0–0.1)
Basophils Relative: 2 %
Eosinophils Absolute: 0.2 10*3/uL (ref 0.0–0.5)
Eosinophils Relative: 2 %
HCT: 34.9 % — ABNORMAL LOW (ref 36.0–46.0)
Hemoglobin: 10.7 g/dL — ABNORMAL LOW (ref 12.0–15.0)
Immature Granulocytes: 7 %
Lymphocytes Relative: 23 %
Lymphs Abs: 2 10*3/uL (ref 0.7–4.0)
MCH: 29.1 pg (ref 26.0–34.0)
MCHC: 30.7 g/dL (ref 30.0–36.0)
MCV: 94.8 fL (ref 80.0–100.0)
Monocytes Absolute: 0.8 10*3/uL (ref 0.1–1.0)
Monocytes Relative: 9 %
Neutro Abs: 5.2 10*3/uL (ref 1.7–7.7)
Neutrophils Relative %: 57 %
Platelets: 287 10*3/uL (ref 150–400)
RBC: 3.68 MIL/uL — ABNORMAL LOW (ref 3.87–5.11)
RDW: 16.4 % — ABNORMAL HIGH (ref 11.5–15.5)
WBC: 8.9 10*3/uL (ref 4.0–10.5)
nRBC: 0.4 % — ABNORMAL HIGH (ref 0.0–0.2)

## 2019-05-28 LAB — GLUCOSE, CAPILLARY
Glucose-Capillary: 202 mg/dL — ABNORMAL HIGH (ref 70–99)
Glucose-Capillary: 190 mg/dL — ABNORMAL HIGH (ref 70–99)
Glucose-Capillary: 201 mg/dL — ABNORMAL HIGH (ref 70–99)
Glucose-Capillary: 215 mg/dL — ABNORMAL HIGH (ref 70–99)

## 2019-05-28 LAB — PROCALCITONIN: Procalcitonin: 0.22 ng/mL

## 2019-05-28 MED ORDER — SEMAGLUTIDE(0.25 OR 0.5MG/DOS) 2 MG/1.5ML ~~LOC~~ SOPN: 0.5000 mg | PEN_INJECTOR | SUBCUTANEOUS | Status: DC

## 2019-05-28 MED ORDER — FUROSEMIDE 10 MG/ML IJ SOLN
40.0000 mg | Freq: Once | INTRAMUSCULAR | Status: AC
  Administered 2019-05-28: 40 mg via INTRAVENOUS
  Filled 2019-05-28: qty 4

## 2019-05-28 MED ORDER — IPRATROPIUM-ALBUTEROL 0.5-2.5 (3) MG/3ML IN SOLN: 3.0000 mL | Freq: Four times a day (QID) | RESPIRATORY_TRACT | Status: DC | PRN

## 2019-05-28 MED ORDER — CANAGLIFLOZIN 100 MG PO TABS: 100.0000 mg | ORAL_TABLET | Freq: Every day | ORAL | Status: DC

## 2019-05-28 MED ORDER — IOHEXOL 350 MG/ML SOLN
80.0000 mL | Freq: Once | INTRAVENOUS | Status: AC | PRN
  Administered 2019-05-28: 14:00:00 80 mL via INTRAVENOUS

## 2019-05-28 MED ORDER — NON FORMULARY: 1.0000 | Freq: Every day | Status: DC

## 2019-05-28 MED ORDER — POTASSIUM CHLORIDE CRYS ER 20 MEQ PO TBCR
40.0000 meq | EXTENDED_RELEASE_TABLET | Freq: Once | ORAL | Status: AC
  Administered 2019-05-28: 40 meq via ORAL
  Filled 2019-05-28: qty 2

## 2019-05-28 MED ORDER — LEVONORGEST-ETH ESTRAD 91-DAY 0.15-0.03 MG PO TABS
1.0000 | ORAL_TABLET | Freq: Every day | ORAL | Status: DC
  Administered 2019-05-29 – 2019-06-01 (×4): 1 via ORAL

## 2019-05-28 MED ORDER — GUAIFENESIN 100 MG/5ML PO SOLN
5.0000 mL | ORAL | Status: DC | PRN
  Administered 2019-05-28 – 2019-06-01 (×13): 100 mg via ORAL
  Filled 2019-05-28 (×13): qty 5

## 2019-05-28 MED ORDER — INSULIN GLARGINE 100 UNIT/ML ~~LOC~~ SOLN
10.0000 [IU] | Freq: Every day | SUBCUTANEOUS | Status: DC
  Administered 2019-05-28: 10 [IU] via SUBCUTANEOUS
  Filled 2019-05-28 (×2): qty 0.1

## 2019-05-28 MED ORDER — CHOLESTYRAMINE 4 G PO PACK
4.0000 g | PACK | Freq: Two times a day (BID) | ORAL | Status: DC
  Administered 2019-05-28 – 2019-06-01 (×9): 4 g via ORAL
  Filled 2019-05-28 (×10): qty 1

## 2019-05-28 NOTE — Progress Notes (Signed)
PROGRESS NOTE  Wanda Oneill  DOB: 1980-07-20  PCP: Darreld Mclean, MD [id number]:[id number]  DOA: 05/26/2019  LOS: 2 days   Chief Complaint  Patient presents with  . Shortness of Breath  . hypoxia   Brief narrative: Wanda Oneill is a 39 y.o. female with PMH significant for Down syndrome, non-insulin-dependent type 2 diabetes, GERD, gastritis, hepatic steatosis, gout, hypothyroidism, IBS, morbid obesity (BMI 44.24). Patient presented to the ED for shortness of breath, hypoxia. 4/15, patient underwent elective bone spur shaving of the right shoulder as an outpatient under interscalene block.  Since then, patient started having drop in oxygen saturation and was brought to the ED post procedure.  Chest x-ray is showed elevated right hemidiaphragm.  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  She required high flow oxygen therapy and was subsequently discharged home on 2 L oxygen by nasal cannula on 4/18.  She is status post both doses of COVID-19 vaccine, second dose 3 weeks ago.  Soon after her hospital discharge the patient was doing okay on 2 L supplemental oxygen but then again, she started becoming hypoxic with pulse ox in the 60s.  Her oxygen requirement has increased over the past few days. 4/21, despite being on 6 L supplemental oxygen, patient's oxygen saturation continued to drop to 70s.  She started being more short of breath and hence was brought to the ED.  In the ED, patient required 6 L oxygen by nasal cannula.  She was tachypneic.  Not tachycardic.  WBC count elevated to 15.1.  Blood glucose 232.   Covid antigen negative. Chest x-ray showing diffuse bilateral and lower lung field predominant streaky densities which may represent atelectasis or atypical infection. Patient received albuterol and IV Lasix 20 mg. Patient was admitted under hospitalist service for further evaluation  management  Subjective: Patient was seen and examined this morning.   Events from last night noted.  Patient was hypoxemic and required 100% NRB.  This morning, she was at 15 L when I saw her.  Her father was at bedside. Patient is not on IV fluid.  No history of CHF. Echocardiogram from yesterday showed 55% EF with grade 2 diastolic dysfunction. Later in the morning today, patient was able to ambulate on the hallway on 6 L oxygen.  Assessment/Plan: Acute hypoxic respiratory failure recent hospitalization for acute hypoxic respiratory failure secondary to phrenic nerve paralysis.   Possible acute exacerbation of diastolic CHF. -Not on supplemental oxygen at home.  Required NRB last night.  Able to titrate down to 6 L today. -Chest x-ray showed diffuse bilateral and lower lung field predominant streaky densities which may represent atelectasis or atypical infection.  -CT angio chest was obtained today which did not show any pulmonary medicine but continued to show diffuse bilateral infiltrates. -Pulmonary consultation appreciated.  Currently on IV Unasyn. -Also started on Lasix 40 mg IV daily today. -Continue supplemental oxygen.  Wean down as tolerated. -Continue incentive spirometry.  Hyperglycemia Noninsulin-dependent type 2 diabetes: A1c 8.2 on 4/21 -Blood glucose 232.  No signs of DKA.   -Home meds include Metformin, Ozempic, Jardiance.  -Currently oral meds on hold.  Diabetes coordinator consulted.  Started on Lantus 10 units nightly and sliding scale insulin with Accu-Cheks.  Gout:  Continue allopurinol  Hypothyroidism:  Continue Synthroid.  DVT prophylaxis:  Lovenox subcu Antimicrobials:  IV Unasyn Fluid: Not on IV fluids Diet: Medium carb diet  Consultants: None Family Communication:  Discussed with patient's father at bedside this morning  Status is: Inpatient  Remains inpatient appropriate because:IV treatments appropriate due to intensity of illness or inability  to take PO   Dispo: The patient is from: Home              Anticipated d/c is to: Home              Anticipated d/c date is: 2 to 3 days              Patient currently is not medically stable to d/c.  Antimicrobials: Anti-infectives (From admission, onward)   Start     Dose/Rate Route Frequency Ordered Stop   05/26/19 2115  azithromycin (ZITHROMAX) 500 mg in sodium chloride 0.9 % 250 mL IVPB  Status:  Discontinued     500 mg 250 mL/hr over 60 Minutes Intravenous Every 24 hours 05/26/19 2101 05/28/19 1454   05/26/19 2115  Ampicillin-Sulbactam (UNASYN) 3 g in sodium chloride 0.9 % 100 mL IVPB     3 g 200 mL/hr over 30 Minutes Intravenous Every 6 hours 05/26/19 2113          Code Status: Full Code   Diet Order            Diet Carb Modified Fluid consistency: Thin; Room service appropriate? Yes with Assist  Diet effective now              Infusions:  . ampicillin-sulbactam (UNASYN) IV 3 g (05/28/19 1221)    Scheduled Meds: . allopurinol  300 mg Oral Daily  . cholestyramine  4 g Oral BID  . enoxaparin (LOVENOX) injection  40 mg Subcutaneous Q24H  . furosemide  40 mg Intravenous Once  . insulin aspart  0-5 Units Subcutaneous QHS  . insulin aspart  0-9 Units Subcutaneous TID WC  . insulin glargine  10 Units Subcutaneous QHS  . levothyroxine  137 mcg Oral Q0600  . potassium chloride  40 mEq Oral Once    PRN meds: acetaminophen **OR** acetaminophen, albuterol, ipratropium-albuterol   Objective: Vitals:   05/28/19 0746 05/28/19 1119  BP: 132/81 127/78  Pulse: 86 80  Resp: 17 (!) 24  Temp: (!) 97.5 F (36.4 C) 98.1 F (36.7 C)  SpO2: 92% 95%    Intake/Output Summary (Last 24 hours) at 05/28/2019 1530 Last data filed at 05/28/2019 0900 Gross per 24 hour  Intake 674.85 ml  Output 1050 ml  Net -375.15 ml   Filed Weights   05/28/19 0621  Weight: 110.3 kg   Weight change:  Body mass index is 41.74 kg/m.   Physical Exam: General exam: On high flow oxygen by  [place] this morning. Skin: No rashes, lesions or ulcers. HEENT: Atraumatic, normocephalic, supple neck, no obvious bleeding Lungs: Minimal crackles bilaterally up to mid lung.  No wheezing CVS: Regular rate and rhythm, no murmur GI/Abd soft, nontender, distended from obesity, bowel sound present CNS: Alert, awake, baseline issues with Down syndrome. Psychiatry: Mood appropriate Extremities: No pedal edema, no calf tenderness  Data Review: I have personally reviewed the laboratory data and studies available.  Recent Labs  Lab 05/22/19 0336 05/22/19 0336 05/26/19 1754 05/26/19 2142 05/27/19 0057 05/27/19 0431 05/28/19 0315  WBC 9.4  --  15.1*  --   --  10.8* 8.9  NEUTROABS  --   --  12.3*  --   --   --  5.2  HGB 11.1*   < > 11.1* 13.3 11.6* 10.9* 10.7*  HCT 36.1   < >  36.7 39.0 34.0* 35.9* 34.9*  MCV 93.3  --  94.8  --   --  94.5 94.8  PLT 229  --  264  --   --  275 287   < > = values in this interval not displayed.   Recent Labs  Lab 05/22/19 0336 05/22/19 0336 05/23/19 0607 05/26/19 1754 05/26/19 2142 05/27/19 0057 05/28/19 0315  NA 137   < > 137 138 137 137 139  K 4.0   < > 4.2 4.6 4.1 4.0 3.7  CL 103  --  101 99  --   --  98  CO2 24  --  23 26  --   --  28  GLUCOSE 190*  --  178* 232*  --   --  178*  BUN 9  --  6 9  --   --  7  CREATININE 0.64  --  0.58 0.81  --   --  0.64  CALCIUM 8.0*  --  8.3* 8.5*  --   --  8.4*   < > = values in this interval not displayed.    Signed, Terrilee Croak, MD [facility] Pager: [phone] (Secure Chat preferred). 05/28/2019

## 2019-05-28 NOTE — Progress Notes (Signed)
2320 Patient dropping oxygen saturations while sleeping to 83% on 7 liters high flow oxygen.Awaken patient and saturations gradually increased back to 90-93% on 7 liters of high flow oxygen.However when fall asleep again saturations drop again to low 80's.Highflow oxygen gradually increased to 15 liters while sleeping and oxygen saturations only increased to 87%.Respiratory therapy called to assist with patient.Patient placed on non rebreather mask and saturations increased 99-100%.Per respiratory therapy leave patient on non breather mask tonight while sleeping and in morning switch back to high flow at 7 liters and to call with any changes.[phone] Text paged NP Sharlet Salina with this information.

## 2019-05-28 NOTE — Progress Notes (Signed)
Pt ambulated in hallway about 317ft on 6L [place]. Pt stopped to rest 4 times. Oxygen saturation maintained 85-94%.  Clyde Canterbury, RN

## 2019-05-28 NOTE — Consult Note (Addendum)
NAME:  Wanda Oneill, MRN:  [id number], DOB:  Jun 20, 1980, LOS: 2 ADMISSION DATE:  05/26/2019, CONSULTATION DATE:  4/23 REFERRING MD:  Rance Muir, CHIEF COMPLAINT:  Hypoxia    Brief History   39 year old female with history of Down syndrome and as well as subacute elevated right hemidiaphragm following shoulder surgery felt possibly due to phrenic nerve injury.  Admitted acutely with acute on chronic hypoxic respiratory failure Portable chest x-ray obtained shows diffuse patchy bilateral airspace disease right predominantly more than left.  Placed on supplemental oxygen, empiric antibiotics, and administered IV diuresis.  Noted to have worsening nocturnal hypoxia and because of this pulmonary asked to evaluate.  History of present illness   39 year old female w/ Downs syndrome, NIDDD. GERD, Gastritis, Gout, MO (wt 110.kg w/ BMI 41.74), Recent bone spur shaving right shoulder (4/15) w/ interscalene block w/ sub-acute elevated right HD felt either 2/2 phrenic nerve injury vs RML collapse.  Was ultimately sent home on supplemental oxygen at 2 L.  Not long after discharge began to have episodes of desaturation with pulse oximetry in the 60s, supplemental oxygen was titrated up to as high as 6 L, because of this he was brought to the emergency room with associated shortness of breath. She was admitted with a working diagnosis of acute hypoxic respiratory failure with initial chest x-ray showing diffuse bilateral streaky airspace disease, with considering differential diagnosis including pneumonia, versus pulmonary edema, versus atelectasis all superimposed on previously mentioned subacutely elevated right hemidiaphragm.  She was given supplemental oxygen, empiric antibiotics, and diuretics.  On 4/22 patient saturation noted to drop to 83% on 7 L while sleeping, once awake saturation once again 90-95% and then fluctuating once again back to 80s when sleeping. Pulmonary has been asked to evaluate for acute/subacute  hypoxic respiratory failure as well as worsening nocturnal hypoxia.  Past  Medical History   Downs syndrome, NIDDD. GERD, Gastritis, Gout, MO (wt 110.kg w/ BMI 41.74), Recent bone spur shaving right shoulder (4/15) w/ interscalene block w/ sub-acute elevated right HD felt either 2/2 phrenic nerve injury vs RML collapse.  Significant Hospital Events   4/21 admitted, placed on empiric antibiotics 4/22 noted to have nocturnal hypoxia with pulse oximetry in the 80s on 7 L when sleeping, rapidly improved when awake 4/23 ambulating in hallway 300 feet on 6 L, pulse oximetry 85-94%  Consults:  pulm consult   Procedures:    Significant Diagnostic Tests:  CT angiogram 4/23:  Negative for pulmonary emboli, did demonstrate diffuse bilateral pulmonary infiltrates Echocardiogram 4/22: LVEF 55% left ventricular diastolic parameters grade 2 diastolic dysfunction RV function normal moderately elevated PA systolic pressure estimated at 52.9 mmHg right atrial pressure 50 mmHg  Micro Data:  Blood culture x2 4/22 Respiratory panel by PCR negative Coronavirus, influenza a and B by PCR all negative  Antimicrobials:  Unasyn 4/21 azithromycin 4/21 Interim history/subjective:  Thinks she feels a little better   Objective   Blood pressure 127/78, pulse 80, temperature 98.1 F (36.7 C), temperature source Oral, resp. rate (Abnormal) 24, weight 110.3 kg, SpO2 95 %.    FiO2 (%):  [100 %] 100 %   Intake/Output Summary (Last 24 hours) at 05/28/2019 1404 Last data filed at 05/28/2019 0900 Gross per 24 hour  Intake 674.85 ml  Output 1050 ml  Net -375.15 ml   Filed Weights   05/28/19 0621  Weight: 110.3 kg    Examination: General: Pleasant 39 year old female with Down syndrome resting comfortably in bed she is hard  of hearing but cooperative HENT: Mucous membranes moist no JVD Lungs: Clear, diminished bases currently on 6 L nasal cannula, she is not exhibiting accessory use Cardiovascular: Regular  rate and rhythm without murmur rub or gallop Abdomen: Soft nontender no organomegaly Extremities: Warm and dry, trace lower extremity edema Neuro: Speech somewhat slurred, but awake oriented no focal deficit GU: Voids  Resolved Hospital Problem list     Assessment & Plan:  Acute hypoxic respiratory failure in the setting of diffuse bilateral pulmonary infiltrates Subacutely elevated right hemidiaphragm.  Most likely secondary pulmonary edema w/ volume overload +/- aspiration pneumonia versus HCAP. Although she doesn't really endorse a hx c/w PNA Plan Cont supplemental oxygen and cont pulse oximetry  We will need to ensure we check walking oximetry prior to discharge Cont IV diuresis ->aim for negative volume status (have written for extra dose) We can narrow antibiotic therapy to Unasyn, will discontinue after 7 days, okay to transition to oral Augmentin Repeating cxr am  Will ask speech-language pathology to evaluate swallowing function, certainly dysphagia will be a concern here Could consider sniff test to confirm diaphragmatic dysfunction on the right, however at this point I think pulmonary infiltrates are driving her increased oxygen requirements Would consider arterial blood gas once volume status optimized, evaluating for baseline hypoxia or hypercarbia  Nocturnal hypoxia Suspect sleep apnea Plan Continue supplemental oxygen with continuous pulse oximetry Will need polysomnogram at discharge  Acute diastolic heart failure/heart failure preserved EF, with grade 2 diastolic heart failure Plan Continue IV diuresis Blood pressure control Daily weight Strict intake output Consider cardiology eval this could be inpatient or outpatient  Diabetes with hyperglycemia Plan Sliding scale insulin  History of hypothyroidism Plan Continue Synthroid replacement  Best practice:  Diet: Carb modified diet Pain/Anxiety/Delirium protocol (if indicated): Not indicated VAP protocol  (if indicated): Not indicated DVT prophylaxis: Low molecular weight heparin GI prophylaxis: Not indicated Glucose control: Sliding scale insulin Mobility: With assistance Code Status: Full code Family Communication: Per primary Disposition: Continue MedSurg support  Labs   CBC: Recent Labs  Lab 05/22/19 0336 05/22/19 0336 05/26/19 1754 05/26/19 2142 05/27/19 0057 05/27/19 0431 05/28/19 0315  WBC 9.4  --  15.1*  --   --  10.8* 8.9  NEUTROABS  --   --  12.3*  --   --   --  5.2  HGB 11.1*   < > 11.1* 13.3 11.6* 10.9* 10.7*  HCT 36.1   < > 36.7 39.0 34.0* 35.9* 34.9*  MCV 93.3  --  94.8  --   --  94.5 94.8  PLT 229  --  264  --   --  275 287   < > = values in this interval not displayed.    Basic Metabolic Panel: Recent Labs  Lab 05/22/19 0336 05/22/19 0336 05/23/19 0607 05/26/19 1754 05/26/19 2142 05/27/19 0057 05/28/19 0315  NA 137   < > 137 138 137 137 139  K 4.0   < > 4.2 4.6 4.1 4.0 3.7  CL 103  --  101 99  --   --  98  CO2 24  --  23 26  --   --  28  GLUCOSE 190*  --  178* 232*  --   --  178*  BUN 9  --  6 9  --   --  7  CREATININE 0.64  --  0.58 0.81  --   --  0.64  CALCIUM 8.0*  --  8.3* 8.5*  --   --  8.4*   < > = values in this interval not displayed.   GFR: Estimated Creatinine Clearance: 114.6 mL/min (by C-G formula based on SCr of 0.64 mg/dL). Recent Labs  Lab 05/22/19 0336 05/26/19 1754 05/26/19 2147 05/27/19 0431 05/28/19 0315  PROCALCITON  --   --  0.41  --  0.22  WBC 9.4 15.1*  --  10.8* 8.9    Liver Function Tests: Recent Labs  Lab 05/26/19 1754  AST 38  ALT 35  ALKPHOS 54  BILITOT 0.5  PROT 6.7  ALBUMIN 2.5*   No results for input(s): LIPASE, AMYLASE in the last 168 hours. No results for input(s): AMMONIA in the last 168 hours.  ABG    Component Value Date/Time   PHART 7.411 05/27/2019 0057   PCO2ART 47.5 05/27/2019 0057   PO2ART 78 (L) 05/27/2019 0057   HCO3 30.2 (H) 05/27/2019 0057   TCO2 32 05/27/2019 0057   O2SAT  95.0 05/27/2019 0057     Coagulation Profile: No results for input(s): INR, PROTIME in the last 168 hours.  Cardiac Enzymes: No results for input(s): CKTOTAL, CKMB, CKMBINDEX, TROPONINI in the last 168 hours.  HbA1C: Hgb A1c MFr Bld  Date/Time Value Ref Range Status  05/26/2019 08:54 PM 8.2 (H) 4.8 - 5.6 % Final    Comment:    (NOTE) Pre diabetes:          5.7%-6.4% Diabetes:              >6.4% Glycemic control for   <7.0% adults with diabetes   10/20/2017 02:29 PM 7.2 (H) 4.8 - 5.6 % Final    Comment:    (NOTE)         Prediabetes: 5.7 - 6.4         Diabetes: >6.4         Glycemic control for adults with diabetes: <7.0     CBG: Recent Labs  Lab 05/27/19 1225 05/27/19 1618 05/27/19 2111 05/28/19 0631 05/28/19 1201  GLUCAP 168* 162* 238* 202* 190*    Review of Systems:   Review of Systems  Constitutional: Positive for malaise/fatigue. Negative for chills and fever.  HENT: Positive for congestion and hearing loss. Negative for sinus pain and sore throat.   Eyes: Negative.   Respiratory: Positive for cough and shortness of breath. Negative for sputum production.   Cardiovascular: Negative for chest pain, palpitations, orthopnea and leg swelling.  Gastrointestinal: Negative.   Genitourinary: Negative.   Musculoskeletal: Negative.   Skin: Negative.   Neurological: Negative.   Endo/Heme/Allergies: Negative.   Psychiatric/Behavioral: Negative.      Past Medical History  She,  has a past medical history of Diabetes mellitus, Down syndrome, Family history of adverse reaction to anesthesia, Gastritis, GERD (gastroesophageal reflux disease), Headache, Hepatic steatosis, Hidradenitis, Hypothyroidism, Irritable bowel syndrome, Periumbilical hernia, Pneumonia, Restless legs, Tendonitis, and Thyroid disease.   Surgical History    Past Surgical History:  Procedure Laterality Date  . AXILLARY HIDRADENITIS EXCISION Bilateral   . CHOLECYSTECTOMY    . HERNIA REPAIR      multiple  . INCISIONAL HERNIA REPAIR  05/30/2015   LAPROSCOPIC  . INCISIONAL HERNIA REPAIR N/A 05/30/2015   Procedure: LAPAROSCOPIC INCISIONAL HERNIA;  Surgeon: Rolm Bookbinder, MD;  Location: [place];  Service: General;  Laterality: N/A;  . INSERTION OF MESH N/A 05/30/2015   Procedure: INSERTION OF MESH;  Surgeon: Rolm Bookbinder, MD;  Location: [place];  Service: General;  Laterality: N/A;  . KNEE ARTHROSCOPY  right knee  . LAPAROSCOPY N/A 05/30/2015   Procedure: LAPAROSCOPY DIAGNOSTIC;  Surgeon: Rolm Bookbinder, MD;  Location: [place];  Service: General;  Laterality: N/A;  . PARTIAL KNEE ARTHROPLASTY Right 10/30/2017   Procedure: UNICOMPARTMENTAL RIGHT KNEE LATERAL;  Surgeon: Paralee Cancel, MD;  Location: [facility];  Service: Orthopedics;  Laterality: Right;  90 mins  . TONSILLECTOMY     adenoids     Social History   reports that she has never smoked. She has never used smokeless tobacco. She reports that she does not drink alcohol or use drugs.   Family History   Her family history includes Diabetes type II in her father and mother; Heart disease in her mother; High Cholesterol in her mother; Hypertension in her father; Stroke in her father; Thyroid cancer in her mother.   Allergies Allergies  Allergen Reactions  . Vancomycin Itching  . Cefuroxime Hives and Swelling    Unspecified location of swelling  . Cefuroxime Axetil Hives  . Sulfa Antibiotics Hives     Home Medications  Prior to Admission medications   Medication Sig Start Date End Date Taking? Authorizing Provider  acetaminophen (TYLENOL) 325 MG tablet Take 650 mg by mouth every 6 (six) hours as needed for mild pain.     [provider]  allopurinol (ZYLOPRIM) 300 MG tablet Take 1 tablet (300 mg total) by mouth daily. 07/07/18   Copland, Gay Filler, MD  Blood Glucose Monitoring Suppl (BLOOD GLUCOSE METER KIT AND SUPPLIES) Dispense based on patient and insurance preference. To check blood sugars daily FOR ICD-9  250.00 Patient taking differently: 1 each by Other route See admin instructions. Dispense based on patient and insurance preference. To check blood sugars daily FOR ICD-9 250.00 08/31/13   Darlyne Russian, MD  cetirizine (ZYRTEC) 10 MG tablet Take 10 mg by mouth daily as needed for allergies.     [provider]  cholestyramine (QUESTRAN) 4 g packet DISSOLVE & TAKE 1 POWDER BY MOUTH TWICE DAILY Patient taking differently: Take 4 g by mouth 2 (two) times daily.  09/25/18   Zehr, Laban Emperor, PA-C  clindamycin (CLEOCIN) 300 MG capsule Take 300 mg by mouth every 6 (six) hours. 05/20/19   [provider]  clobetasol cream (TEMOVATE) 9.98 % Apply 1 application topically daily as needed (for eczema).  10/07/12   [provider]  dicyclomine (BENTYL) 20 MG tablet Take 1 tablet (20 mg total) by mouth 4 (four) times daily -  before meals and at bedtime. Patient taking differently: Take 20 mg by mouth 2 (two) times a day.  04/25/17   Copland, Gay Filler, MD  empagliflozin (JARDIANCE) 25 MG TABS tablet Take 25 mg by mouth daily.    [provider]  Glucose Blood (BLOOD GLUCOSE TEST STRIPS) STRP Use to test blood sugar up to twice a day Patient taking differently: 1 each by Other route See admin instructions. Use to test blood sugar up to twice a day 07/07/18   Copland, Gay Filler, MD  JOLESSA 0.15-0.03 MG tablet Take 1 tablet by mouth once daily 02/02/19   Copland, Gay Filler, MD  levothyroxine (SYNTHROID) 137 MCG tablet TAKE 1 TABLET BY MOUTH IN THE MORNING . APPOINTMENT REQUIRED FOR FUTURE REFILLS Patient taking differently: Take 137 mcg by mouth every evening.  02/08/19   Copland, Gay Filler, MD  metFORMIN (GLUCOPHAGE) 1000 MG tablet Take 1,000 mg by mouth 2 (two) times daily.  01/16/16   [provider]  naproxen sodium (ALEVE) 220  MG tablet Take 220 mg by mouth daily as needed (pain).     [provider]  nystatin (MYCOSTATIN/NYSTOP) powder Apply 1 application  topically 3 (three) times daily. Use as needed for candida intertrigo Patient takingdifferently: Apply 1 application topically 3 (three) times daily as needed (Use as needed for candida intertrigo).  04/21/19   Copland, Gay Filler, MD  ondansetron (ZOFRAN) 4 MG tablet Take 4 mg by mouth every 8 (eight) hours as needed for nausea/vomiting. 05/20/19   [provider]  ONE TOUCH ULTRA TEST test strip USE TO CHECK BLOOD SUGAR ONCE DAILY (ICD CODE:25.00) Patient taking differently: 1 each by Other route daily.  05/08/13   Gale Journey, Damaris Hippo, PA-C  oxyCODONE (OXY IR/ROXICODONE) 5 MG immediate release tablet Take 5 mg by mouth every 4 (four) hours as needed for pain.    [provider]  Semaglutide,0.25 or 0.5MG/DOS, (OZEMPIC, 0.25 OR 0.5 MG/DOSE,) 2 MG/1.5ML SOPN Inject 0.5 mg into the skin once a week. Saturdays    [provider]     Critical care time: NA    Erick Colace ACNP-BC [place] Pager # [phone] OR # [phone] if no answer

## 2019-05-28 NOTE — Progress Notes (Signed)
Inpatient Diabetes Program Recommendations  AACE/ADA: New Consensus Statement on Inpatient Glycemic Control (2015)  Target Ranges:  Prepandial:   less than 140 mg/dL      Peak postprandial:   less than 180 mg/dL (1-2 hours)      Critically ill patients:  140 - 180 mg/dL   Lab Results  Component Value Date   GLUCAP 190 (H) 05/28/2019   HGBA1C 8.2 (H) 05/26/2019    Review of Glycemic Control Results for Wanda Oneill, Wanda Oneill (MRN [id number]) as of 05/28/2019 12:54  Ref. Range 05/27/2019 12:25 05/27/2019 16:18 05/27/2019 21:11 05/28/2019 06:31 05/28/2019 12:01  Glucose-Capillary Latest Ref Range: 70 - 99 mg/dL 168 (H) 162 (H) 238 (H) 202 (H) 190 (H)   Diabetes history: DM 2 Outpatient Diabetes medications: Jardiance 25 mg daily, Metformin 1000 mg bid, Semaglutide 0.5 mg weekly Current orders for Inpatient glycemic control:  Novolog sensitive tid with meals and HS Invokana 25 mg daily Semaglutide 0.5 mg weekly Inpatient Diabetes Program Recommendations:    Consider holding Invokana and Semaglutide while patient is inpatient. Consider adding Lantus 10 units daily while in the hospital.  Thanks  Adah Perl, RN, BC-ADM Inpatient Diabetes Coordinator Pager [phone] (8a-5p)

## 2019-05-28 NOTE — Care Management Important Message (Signed)
Important Message  Patient Details  Name: BELLAMAE BIGNELL MRN: [id number] Date of Birth: 1980/08/01   Medicare Important Message Given:  Yes     Shelda Altes 05/28/2019, 8:29 AM

## 2019-05-28 NOTE — Progress Notes (Signed)
Patient continue to remain on non breather mask oxygen saturations have ranged 92-94 % while sleeping.Will report off to on coming shift to switch oxygen back to high flow when patient awakens for day.

## 2019-05-29 ENCOUNTER — Inpatient Hospital Stay (HOSPITAL_COMMUNITY): Payer: Medicare HMO

## 2019-05-29 DIAGNOSIS — J189 Pneumonia, unspecified organism: Secondary | ICD-10-CM

## 2019-05-29 LAB — CBC WITH DIFFERENTIAL/PLATELET
Abs Immature Granulocytes: 0.66 10*3/uL — ABNORMAL HIGH (ref 0.00–0.07)
Basophils Absolute: 0.1 10*3/uL (ref 0.0–0.1)
Basophils Relative: 1 %
Eosinophils Absolute: 0.2 10*3/uL (ref 0.0–0.5)
Eosinophils Relative: 3 %
HCT: 38.6 % (ref 36.0–46.0)
Hemoglobin: 11.7 g/dL — ABNORMAL LOW (ref 12.0–15.0)
Immature Granulocytes: 8 %
Lymphocytes Relative: 21 %
Lymphs Abs: 1.8 10*3/uL (ref 0.7–4.0)
MCH: 28.3 pg (ref 26.0–34.0)
MCHC: 30.3 g/dL (ref 30.0–36.0)
MCV: 93.5 fL (ref 80.0–100.0)
Monocytes Absolute: 0.8 10*3/uL (ref 0.1–1.0)
Monocytes Relative: 9 %
Neutro Abs: 4.9 10*3/uL (ref 1.7–7.7)
Neutrophils Relative %: 58 %
Platelets: 306 10*3/uL (ref 150–400)
RBC: 4.13 MIL/uL (ref 3.87–5.11)
RDW: 16.2 % — ABNORMAL HIGH (ref 11.5–15.5)
WBC: 8.5 10*3/uL (ref 4.0–10.5)
nRBC: 0.6 % — ABNORMAL HIGH (ref 0.0–0.2)

## 2019-05-29 LAB — BASIC METABOLIC PANEL
Anion gap: 13 (ref 5–15)
BUN: 5 mg/dL — ABNORMAL LOW (ref 6–20)
CO2: 30 mmol/L (ref 22–32)
Calcium: 8.7 mg/dL — ABNORMAL LOW (ref 8.9–10.3)
Chloride: 97 mmol/L — ABNORMAL LOW (ref 98–111)
Creatinine, Ser: 0.56 mg/dL (ref 0.44–1.00)
GFR calc Af Amer: >60 mL/min (ref >60)
GFR calc non Af Amer: >60 mL/min (ref >60)
Glucose, Bld: 201 mg/dL — ABNORMAL HIGH (ref 70–99)
Potassium: 3.7 mmol/L (ref 3.5–5.1)
Sodium: 140 mmol/L (ref 135–145)

## 2019-05-29 LAB — GLUCOSE, CAPILLARY
Glucose-Capillary: 196 mg/dL — ABNORMAL HIGH (ref 70–99)
Glucose-Capillary: 280 mg/dL — ABNORMAL HIGH (ref 70–99)
Glucose-Capillary: 212 mg/dL — ABNORMAL HIGH (ref 70–99)

## 2019-05-29 LAB — PROCALCITONIN: Procalcitonin: 0.11 ng/mL

## 2019-05-29 MED ORDER — INSULIN GLARGINE 100 UNIT/ML ~~LOC~~ SOLN
15.0000 [IU] | Freq: Every day | SUBCUTANEOUS | Status: DC
  Administered 2019-05-29 – 2019-05-30 (×2): 15 [IU] via SUBCUTANEOUS
  Filled 2019-05-29 (×3): qty 0.15

## 2019-05-29 NOTE — Progress Notes (Signed)
Pt refused CPAP

## 2019-05-29 NOTE — Progress Notes (Addendum)
RT called to beside with Nurse due to patient exhibiting fluctuation in her respiratory rate from 16-51 in a 30 second span. Patient stated while awake she felt nauseous and had to poop. Rt called Supervisor concerning pts RR and the inconsistency is more than likely due to her needing to poop. Patient is comfortable, not in pain, not WOB or SOB. HR consistent at 85 and sats range between 94 and 100. Patient is currently still on the NRB and will continue until the day shift. Discussed again that she could not tolerate the CPAP and that a message has been sent to the provider to submit a referral for a Sleep Study for the patient. J Jammy Plotkin RT [phone]

## 2019-05-29 NOTE — Evaluation (Addendum)
Clinical/Bedside Swallow Evaluation Patient Details  Name: Wanda Oneill MRN: [id number] Date of Birth: 01-31-1981  Today's Date: 05/29/2019 Time: SLP Start Time (ACUTE ONLY): [id number] SLP Stop Time (ACUTE ONLY): 0910 SLP Time Calculation (min) (ACUTE ONLY): 16 min  Past Medical History:  Past Medical History:  Diagnosis Date  . Diabetes mellitus    type II  . Down syndrome   . Family history of adverse reaction to anesthesia    mom has had n/v  . Gastritis   . GERD (gastroesophageal reflux disease)   . Headache   . Hepatic steatosis   . Hidradenitis   . Hypothyroidism   . Irritable bowel syndrome   . Periumbilical hernia   . Pneumonia   . Restless legs   . Tendonitis    chronic in left foot  . Thyroid disease    hypothyroidism   Past Surgical History:  Past Surgical History:  Procedure Laterality Date  . AXILLARY HIDRADENITIS EXCISION Bilateral   . CHOLECYSTECTOMY    . HERNIA REPAIR     multiple  . INCISIONAL HERNIA REPAIR  05/30/2015   LAPROSCOPIC  . INCISIONAL HERNIA REPAIR N/A 05/30/2015   Procedure: LAPAROSCOPIC INCISIONAL HERNIA;  Surgeon: Rolm Bookbinder, MD;  Location: [place];  Service: General;  Laterality: N/A;  . INSERTION OF MESH N/A 05/30/2015   Procedure: INSERTION OF MESH;  Surgeon: Rolm Bookbinder, MD;  Location: [place];  Service: General;  Laterality: N/A;  . KNEE ARTHROSCOPY     right knee  . LAPAROSCOPY N/A 05/30/2015   Procedure: LAPAROSCOPY DIAGNOSTIC;  Surgeon: Rolm Bookbinder, MD;  Location: [place];  Service: General;  Laterality: N/A;  . PARTIAL KNEE ARTHROPLASTY Right 10/30/2017   Procedure: UNICOMPARTMENTAL RIGHT KNEE LATERAL;  Surgeon: Paralee Cancel, MD;  Location: [facility];  Service: Orthopedics;  Laterality: Right;  90 mins  . TONSILLECTOMY     adenoids   HPI:  Wanda Oneill is a 39 y.o. female with medical history significant of Down syndrome, non-insulin-dependent type 2 diabetes, GERD, gastritis, hepatic steatosis, gout, hypothyroidism,  IBS, presenting to the ED for evaluation of shortness of breath and hypoxia. Chest CT on 4/24 reported: "Bilateral airspace lung opacities consistent with multifocal pneumonia."    Assessment / Plan / Recommendation Clinical Impression  Pt was seen for a bedside swallow evaluation and she presents with suspected functional oropharyngeal swallowing abilities.  Pt presented with a consistent baseline cough upon SLP arrival that persisted throughout this evaluation.  She reported that she consumes regular solids and thin liquids at baseline without difficulty.  Oral mechanism exam was unremarkable.  She consumed trials of thin liquid and preferred solids (grapes).  Mastication was timely and suspect timely swallow initiation.  Pt was noted to hold liquid in her oral cavity briefly prior to swallow initiation, but this was functional.  Delayed throat clearing was observed intermittently following PO trials.  Suspect that it was related to baseline cough, but unable to r/o laryngeal penetration or aspiration.  Pt reported that some of the meats on the regular diet were difficult to masticate, therefore recommend diet change to Dysphagia 3 (soft) solid with continuation of thin liquid.  Pt will benefit from the following compensatory strategies: 1) Small bites/sips 2) Slow rate of intake 3) Take a break if SOB 4) Sit upright as possible.  SLP will f/u to monitor diet tolerance.    *Addendum at 11:55 am: Received a message from the RN who reported that the pt was unhappy with the limitations  of the Dysphagia 3 diet and that she wished to resume a regular solid diet.  Diet was changed back to regular solids (carb modified) with note in diet order to please pre-cut meats per pt request in order to make self-feeding easier.   SLP Visit Diagnosis: Dysphagia, unspecified (R13.10)    Aspiration Risk  Mild aspiration risk    Diet Recommendation Dysphagia 3 (Mech soft);Thin liquid   Liquid Administration via:  Cup;Straw Medication Administration: Whole meds with liquid Supervision: Patient able to self feed;Intermittent supervision to cue for compensatory strategies Compensations: Slow rate;Small sips/bites(Take a break from PO intake if SOB ) Postural Changes: Seated upright at 90 degrees    Other  Recommendations Oral Care Recommendations: Oral care BID   Follow up Recommendations None      Frequency and Duration min 1 x/week  1 week       Prognosis Prognosis for Safe Diet Advancement: Good      Swallow Study   General HPI: Wanda Oneill is a 39 y.o. female with medical history significant of Down syndrome, non-insulin-dependent type 2 diabetes, GERD, gastritis, hepatic steatosis, gout, hypothyroidism, IBS, presenting to the ED for evaluation of shortness of breath and hypoxia. Chest CT on 4/24 reported: "Bilateral airspace lung opacities consistent with multifocal pneumonia."  Type of Study: Bedside Swallow Evaluation Previous Swallow Assessment: None  Diet Prior to this Study: Regular;Thin liquids Temperature Spikes Noted: Yes Respiratory Status: Nasal cannula History of Recent Intubation: No Behavior/Cognition: Alert;Cooperative;Pleasant mood Oral Cavity Assessment: Within Functional Limits Oral Care Completed by SLP: No Oral Cavity - Dentition: Adequate natural dentition Vision: Functional for self-feeding Self-Feeding Abilities: Able to feed self Patient Positioning: Upright in bed Baseline Vocal Quality: Normal Volitional Cough: Strong Volitional Swallow: Able to elicit    Oral/Motor/Sensory Function Overall Oral Motor/Sensory Function: Within functional limits   Ice Chips Ice chips: Not tested   Thin Liquid Thin Liquid: Impaired Presentation: Straw;Cup Pharyngeal  Phase Impairments: Throat Clearing - Delayed    Nectar Thick Nectar Thick Liquid: Not tested   Honey Thick Honey Thick Liquid: Not tested   Puree Puree: Not tested   Solid     Solid:  Impaired Presentation: Self Fed Pharyngeal Phase Impairments: Throat Clearing - Delayed     Colin Mulders M.S., CCC-SLP Acute Rehabilitation Services Office: [phone]  Elvia Collum AmeLie Hollars 05/29/2019,9:33 AM

## 2019-05-29 NOTE — Progress Notes (Addendum)
NAME:  Wanda Oneill, MRN:  [id number], DOB:  06-17-1980, LOS: 3 ADMISSION DATE:  05/26/2019, CONSULTATION DATE:  4/23 REFERRING MD:  Rance Muir, CHIEF COMPLAINT:  Hypoxia    Brief History   39 year old female with history of Down syndrome and as well as subacute elevated right hemidiaphragm following shoulder surgery felt possibly due to phrenic nerve injury.  Admitted acutely with acute on chronic hypoxic respiratory failure Portable chest x-ray obtained shows diffuse patchy bilateral airspace disease right predominantly more than left.  Placed on supplemental oxygen, empiric antibiotics, and administered IV diuresis.  Noted to have worsening nocturnal hypoxia and because of this pulmonary asked to evaluate.  History of present illness   39 year old female w/ Downs syndrome, NIDDD. GERD, Gastritis, Gout, MO (wt 110.kg w/ BMI 41.74), Recent bone spur shaving right shoulder (4/15) w/ interscalene block w/ sub-acute elevated right HD felt either 2/2 phrenic nerve injury vs RML collapse.  Was ultimately sent home on supplemental oxygen at 2 L.  Not long after discharge began to have episodes of desaturation with pulse oximetry in the 60s, supplemental oxygen was titrated up to as high as 6 L, because of this he was brought to the emergency room with associated shortness of breath. She was admitted with a working diagnosis of acute hypoxic respiratory failure with initial chest x-ray showing diffuse bilateral streaky airspace disease, with considering differential diagnosis including pneumonia, versus pulmonary edema, versus atelectasis all superimposed on previously mentioned subacutely elevated right hemidiaphragm.  She was given supplemental oxygen, empiric antibiotics, and diuretics.  On 4/22 patient saturation noted to drop to 83% on 7 L while sleeping, once awake saturation once again 90-95% and then fluctuating once again back to 80s when sleeping. Pulmonary has been asked to evaluate for acute/subacute  hypoxic respiratory failure as well as worsening nocturnal hypoxia.  Past  Medical History   Downs syndrome, NIDDD. GERD, Gastritis, Gout, MO (wt 110.kg w/ BMI 41.74), Recent bone spur shaving right shoulder (4/15) w/ interscalene block w/ sub-acute elevated right HD felt either 2/2 phrenic nerve injury vs RML collapse.  Significant Hospital Events   4/21 admitted, placed on empiric antibiotics 4/22 noted to have nocturnal hypoxia with pulse oximetry in the 80s on 7 L when sleeping, rapidly improved when awake 4/23 ambulating in hallway 300 feet on 6 L, pulse oximetry 85-94%  Consults:  pulm consult   Procedures:    Significant Diagnostic Tests:  Clinical/Bedside Swallow Evaluation>> Mild aspiration risk 4/24 Oral mechanism exam was unremarkable.  She consumed trials of thin liquid and preferred solids (grapes).  Mastication was timely and suspect timely swallow initiation.  Pt was noted to hold liquid in her oral cavity briefly prior to swallow initiation, but this was functional.  Delayed throat clearing was observed intermittently following PO trials.  Suspect that it was related to baseline cough, but unable to r/o laryngeal penetration or aspiration.  Pt reported that some of the meats on the regular diet were difficult to masticate, therefore recommend diet change to Dysphagia 3 (soft) solid with continuation of thin liquid.  Pt will benefit from the following compensatory strategies: 1) Small bites/sips 2) Slow rate of intake 3) Take a break if SOB 4) Sit upright as possible.  SLP will f/u to monitor diet tolerance.    Pt was unhappy with the limitations of the Dysphagia 3 diet and that she wished to resume a regular solid diet.  Diet was changed back to regular solids (carb modified) with note  in diet order to please pre-cut meats per pt request in order to make self-feeding easier.   CT angiogram 4/23:  Negative for pulmonary emboli, did demonstrate diffuse bilateral pulmonary  infiltrates consistent with multifocal pneumonia. Echocardiogram 4/22: LVEF 55% left ventricular diastolic parameters grade 2 diastolic dysfunction RV function normal moderately elevated PA systolic pressure estimated at 52.9 mmHg right atrial pressure 50 mmHg  Micro Data:  Blood culture x2 4/22 Respiratory panel by PCR negative Coronavirus, influenza a and B by PCR all negative  Antimicrobials:  Unasyn 4/21 azithromycin 4/21 Interim history/subjective:  States she feels a bit better Still has cough with tan to brown secretions.  Weaned to 5 L [place], sats are 95% CXR 4/24 Stable Bilateral  infiltrates, particularly in the bases, most worrisome for pneumonia or aspiration, with no  interval changes  Diuresed 2300 cc's 4/23 Swallow Eval with mild aspiration risk noted.  Objective   Blood pressure (!) 133/93, pulse 95, temperature 98 F (36.7 C), temperature source Oral, resp. rate 16, weight 109.9 kg, SpO2 95 %.    FiO2 (%):  [100 %] 100 %   Intake/Output Summary (Last 24 hours) at 05/29/2019 1240 Last data filed at 05/29/2019 0900 Gross per 24 hour  Intake 680 ml  Output 2100 ml  Net -1420 ml   Filed Weights   05/28/19 0621 05/29/19 0658  Weight: 110.3 kg 109.9 kg    Examination: General: Pleasant 39 year old female with Down syndrome, sitting up in chair, very pleasant and co-operative  HENT: Mucous membranes moist no JVD, wearing [place] at 5 L Lungs: Bilateral chest excursion with rhonchi throughout, diminished per  bases currently on 5 L nasal cannula, no accessory use, speaking in full sentences. Cardiovascular: S1, S2, Regular rate and rhythm without murmur rub or gallop Abdomen: Soft nontender no organomegaly, Body mass index is 41.57 kg/m. Extremities: Warm and dry, trace lower extremity edema, brisk capillary refill. Neuro: Speech somewhat slurred, but awake oriented no focal deficit GU: Voids/Not examined  Resolved Hospital Problem list     Assessment & Plan:    Acute hypoxic respiratory failure in the setting of diffuse bilateral pulmonary infiltrates/ multifocal pneumonia Subacutely elevated right hemidiaphragm.  Most likely secondary pulmonary edema w/ volume overload +/- aspiration pneumonia versus HCAP. Although she doesn't really endorse a hx c/w PNA Plan Cont supplemental oxygen and cont pulse oximetry  Titrate oxygen for Saturation goals are > 92% We will need to ensure we check walking oximetry prior to discharge Diuresed 3200 cc's 4/23 Continue  Unasyn ( started 4/21)  , will discontinue after 7 days, okay to transition to oral Augmentin Trend CXR 4/25  and prn See Speech eval results noted above>> Mild Aspiration risk Could consider sniff test to confirm diaphragmatic dysfunction on the right, however at this point I think pulmonary infiltrates are driving her increased oxygen requirements  Nocturnal hypoxia Suspect sleep apnea Per Mom she does not snore. She was tested 16 years ago, and this was negative She has had weight gain since previous testing Did not tolerate CPAP last night ( 4/23)   Plan Will need polysomnogram at discharge Continue supplemental oxygen with continuous pulse oximetry Consider nasal pillows as OP  as option if + OSA    Acute diastolic heart failure/heart failure preserved EF, with grade 2 diastolic heart failure Plan Diuresis per primary team Blood pressure control Daily weight Strict intake output Consider cardiology eval/ follow up as  inpatient or outpatient  Diabetes with hyperglycemia Family have requested a  regular diet despite diagnosis They understand the risks of hyperglycemia Plan Sliding scale insulin CBG's  History of hypothyroidism Plan Continue Synthroid replacement   PCCM will continue to follow along with you  Best practice:  Diet: Regular  Pain/Anxiety/Delirium protocol (if indicated): Not indicated VAP protocol (if indicated): Not indicated DVT prophylaxis: Low  molecular weight heparin GI prophylaxis: Not indicated Glucose control: Sliding scale insulin Mobility: With assistance Code Status: Full code Family Communication: Mother updated in full at bedside 4/24  Disposition: Continue MedSurg support  Labs   CBC: Recent Labs  Lab 05/26/19 1754 05/26/19 1754 05/26/19 2142 05/27/19 0057 05/27/19 0431 05/28/19 0315 05/29/19 0248  WBC 15.1*  --   --   --  10.8* 8.9 8.5  NEUTROABS 12.3*  --   --   --   --  5.2 4.9  HGB 11.1*   < > 13.3 11.6* 10.9* 10.7* 11.7*  HCT 36.7   < > 39.0 34.0* 35.9* 34.9* 38.6  MCV 94.8  --   --   --  94.5 94.8 93.5  PLT 264  --   --   --  275 287 306   < > = values in this interval not displayed.    Basic Metabolic Panel: Recent Labs  Lab 05/23/19 0607 05/23/19 0607 05/26/19 1754 05/26/19 2142 05/27/19 0057 05/28/19 0315 05/29/19 0248  NA 137   < > 138 137 137 139 140  K 4.2   < > 4.6 4.1 4.0 3.7 3.7  CL 101  --  99  --   --  98 97*  CO2 23  --  26  --   --  28 30  GLUCOSE 178*  --  232*  --   --  178* 201*  BUN 6  --  9  --   --  7 5*  CREATININE 0.58  --  0.81  --   --  0.64 0.56  CALCIUM 8.3*  --  8.5*  --   --  8.4* 8.7*   < > = values in this interval not displayed.   GFR: Estimated Creatinine Clearance: 114.5 mL/min (by C-G formula based on SCr of 0.56 mg/dL). Recent Labs  Lab 05/26/19 1754 05/26/19 2147 05/27/19 0431 05/28/19 0315 05/29/19 0248  PROCALCITON  --  0.41  --  0.22 0.11  WBC 15.1*  --  10.8* 8.9 8.5    Liver Function Tests: Recent Labs  Lab 05/26/19 1754  AST 38  ALT 35  ALKPHOS 54  BILITOT 0.5  PROT 6.7  ALBUMIN 2.5*   No results for input(s): LIPASE, AMYLASE in the last 168 hours. No results for input(s): AMMONIA in the last 168 hours.  ABG    Component Value Date/Time   PHART 7.411 05/27/2019 0057   PCO2ART 47.5 05/27/2019 0057   PO2ART 78 (L) 05/27/2019 0057   HCO3 30.2 (H) 05/27/2019 0057   TCO2 32 05/27/2019 0057   O2SAT 95.0 05/27/2019 0057       Coagulation Profile: No results for input(s): INR, PROTIME in the last 168 hours.  Cardiac Enzymes: No results for input(s): CKTOTAL, CKMB, CKMBINDEX, TROPONINI in the last 168 hours.  HbA1C: Hgb A1c MFr Bld  Date/Time Value Ref Range Status  05/26/2019 08:54 PM 8.2 (H) 4.8 - 5.6 % Final    Comment:    (NOTE) Pre diabetes:          5.7%-6.4% Diabetes:              >  6.4% Glycemic control for   <7.0% adults with diabetes   10/20/2017 02:29 PM 7.2 (H) 4.8 - 5.6 % Final    Comment:    (NOTE)         Prediabetes: 5.7 - 6.4         Diabetes: >6.4         Glycemic control for adults with diabetes: <7.0     CBG: Recent Labs  Lab 05/28/19 1201 05/28/19 1701 05/28/19 2110 05/29/19 0637 05/29/19 1201  GLUCAP 190* 201* 215* 196* 280*    Past Medical History  She,  has a past medical history of Diabetes mellitus, Down syndrome, Family history of adverse reaction to anesthesia, Gastritis, GERD (gastroesophageal reflux disease), Headache, Hepatic steatosis, Hidradenitis, Hypothyroidism, Irritable bowel syndrome, Periumbilical hernia, Pneumonia, Restless legs, Tendonitis, and Thyroid disease.   Surgical History    Past Surgical History:  Procedure Laterality Date  . AXILLARY HIDRADENITIS EXCISION Bilateral   . CHOLECYSTECTOMY    . HERNIA REPAIR     multiple  . INCISIONAL HERNIA REPAIR  05/30/2015   LAPROSCOPIC  . INCISIONAL HERNIA REPAIR N/A 05/30/2015   Procedure: LAPAROSCOPIC INCISIONAL HERNIA;  Surgeon: Rolm Bookbinder, MD;  Location: [facility];  Service: General;  Laterality: N/A;  . INSERTION OF MESH N/A 05/30/2015   Procedure: INSERTION OF MESH;  Surgeon: Rolm Bookbinder, MD;  Location: [facility];  Service: General;  Laterality: N/A;  . KNEE ARTHROSCOPY     right knee  . LAPAROSCOPY N/A 05/30/2015   Procedure: LAPAROSCOPY DIAGNOSTIC;  Surgeon: Rolm Bookbinder, MD;  Location: [facility];  Service: General;  Laterality: N/A;  . PARTIAL KNEE ARTHROPLASTY Right 10/30/2017    Procedure: UNICOMPARTMENTAL RIGHT KNEE LATERAL;  Surgeon: Paralee Cancel, MD;  Location: [facility];  Service: Orthopedics;  Laterality: Right;  90 mins  . TONSILLECTOMY     adenoids     Social History   reports that she has never smoked. She has never used smokeless tobacco. She reports that she does not drink alcohol or use drugs.   Family History   Her family history includes Diabetes type II in her father and mother; Heart disease in her mother; High Cholesterol in her mother; Hypertension in her father; Stroke in her father; Thyroid cancer in her mother.   Allergies Allergies  Allergen Reactions  . Vancomycin Itching  . Cefuroxime Hives and Swelling    Unspecified location of swelling  . Cefuroxime Axetil Hives  . Sulfa Antibiotics Hives     Home Medications  Prior to Admission medications   Medication Sig Start Date End Date Taking? Authorizing Provider  acetaminophen (TYLENOL) 325 MG tablet Take 650 mg by mouth every 6 (six) hours as needed for mild pain.     [provider]  allopurinol (ZYLOPRIM) 300 MG tablet Take 1 tablet (300 mg total) by mouth daily. 07/07/18   Copland, Gay Filler, MD  Blood Glucose Monitoring Suppl (BLOOD GLUCOSE METER KIT AND SUPPLIES) Dispense based on patient and insurance preference. To check blood sugars daily FOR ICD-9 250.00 Patient taking differently: 1 each by Other route See admin instructions. Dispense based on patient and insurance preference. To check blood sugars daily FOR ICD-9 250.00 08/31/13   Darlyne Russian, MD  cetirizine (ZYRTEC) 10 MG tablet Take 10 mg by mouth daily as needed for allergies.     [provider]  cholestyramine (QUESTRAN) 4 g packet DISSOLVE & TAKE 1 POWDER BY MOUTH TWICE DAILY Patient taking differently: Take 4 g by mouth  2 (two) times daily.  09/25/18   Zehr, Laban Emperor, PA-C  clindamycin (CLEOCIN) 300 MG capsule Take 300 mg by mouth every 6 (six) hours. 05/20/19   [provider]  clobetasol cream  (TEMOVATE) 0.03 % Apply 1 application topically daily as needed (for eczema).  10/07/12   [provider]  dicyclomine (BENTYL) 20 MG tablet Take 1 tablet (20 mg total) by mouth 4 (four) times daily -  before meals and at bedtime. Patient taking differently: Take 20 mg by mouth 2 (two) times a day.  04/25/17   Copland, Gay Filler, MD  empagliflozin (JARDIANCE) 25 MG TABS tablet Take 25 mg by mouth daily.    [provider]  Glucose Blood (BLOOD GLUCOSE TEST STRIPS) STRP Use to test blood sugar up to twice a day Patient taking differently: 1 each by Other route See admin instructions. Use to test blood sugar up to twice a day 07/07/18   Copland, Gay Filler, MD  JOLESSA 0.15-0.03 MG tablet Take 1 tablet by mouth once daily 02/02/19   Copland, Gay Filler, MD  levothyroxine (SYNTHROID) 137 MCG tablet TAKE 1 TABLET BY MOUTH IN THE MORNING . APPOINTMENT REQUIRED FOR FUTURE REFILLS Patient taking differently: Take 137 mcg by mouth every evening.  02/08/19   Copland, Gay Filler, MD  metFORMIN (GLUCOPHAGE) 1000 MG tablet Take 1,000 mg by mouth 2 (two) times daily.  01/16/16   [provider]  naproxen sodium (ALEVE) 220 MG tablet Take 220 mg by mouth daily as needed (pain).     [provider]  nystatin (MYCOSTATIN/NYSTOP) powder Apply 1 application topically 3 (three) times daily. Use as needed for candida intertrigo Patient takingdifferently: Apply 1 application topically 3 (three) times daily as needed (Use as needed for candida intertrigo).  04/21/19   Copland, Gay Filler, MD  ondansetron (ZOFRAN) 4 MG tablet Take 4 mg by mouth every 8 (eight) hours as needed for nausea/vomiting. 05/20/19   [provider]  ONE TOUCH ULTRA TEST test strip USE TO CHECK BLOOD SUGAR ONCE DAILY (ICD CODE:25.00) Patient taking differently: 1 each by Other route daily.  05/08/13   Gale Journey, Damaris Hippo, PA-C  oxyCODONE (OXY IR/ROXICODONE) 5 MG immediate release tablet Take 5 mg by mouth every 4 (four)  hours as needed for pain.    [provider]  Semaglutide,0.25 or 0.5MG/DOS, (OZEMPIC, 0.25 OR 0.5 MG/DOSE,) 2 MG/1.5ML SOPN Inject 0.5 mg into the skin once a week. Saturdays    [provider]     Critical care time: NA    Magdalen Spatz, MSN, AGACNP-BC [place] Please refer to Amion for schedule and pager numbers  05/29/2019 12:41 PM

## 2019-05-29 NOTE — Progress Notes (Signed)
Pt refused Cpap. Currently on venturi mask at 8L. O2 at 90%

## 2019-05-29 NOTE — Progress Notes (Signed)
Patient could not tolerate the CPAP. Her RR increased to 41 and her sats dropped. Placed pt on a NRB at 15L for the night. Will discuss goal and care plan with day shift RT and have her to speak with the attending about the pt and setting up a Sleep Study for Sleep Apnea in a controlled environment.

## 2019-05-29 NOTE — Progress Notes (Signed)
Was told my N.T. that patient resp were up in the 40's. Went to check on patient. She was asleep on CPAP sats. ranging from 87-92% . Awaken patient no complaints voiced. Lungs dec. b..s. bilat. Skin warm and dry. Using abdo. Muscles. Karen Kitchens. aware and resp Notified. Thayer Headings with Resp was called and made aware of the above. Thayer Headings advise me to put patient back on high flow [place]. 6 liters and she will be coming to see patient. Patient is a mouth breather. Lee R.N. in room increase High flow to 10 liters. Resp. Arrived at 00:45 and change patient To  high flow non rebreather mask 100% 15 liters. Resp came down in the upper 20 's to low 30's resp.  sats. improving Cont. To monitor patient.

## 2019-05-29 NOTE — Progress Notes (Signed)
PROGRESS NOTE  Wanda Oneill  DOB: 02-14-1980  PCP: Darreld Mclean, MD [id number]  DOA: 05/26/2019  LOS: 3 days   Chief Complaint  Patient presents with  . Shortness of Breath  . hypoxia   Brief narrative: Wanda Oneill is a 39 y.o. female with PMH significant for Down syndrome, non-insulin-dependent type 2 diabetes, GERD, gastritis, hepatic steatosis, gout, hypothyroidism, IBS, morbid obesity (BMI 44.24). Patient presented to the ED for shortness of breath, hypoxia. 4/15, patient underwent elective bone spur shaving of the right shoulder as an outpatient under interscalene block.  Since then, patient started having drop in oxygen saturation and was brought to the ED post procedure.  Chest x-ray is showed elevated right hemidiaphragm.  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  She required high flow oxygen therapy and was subsequently discharged home on 2 L oxygen by nasal cannula on 4/18.  She is status post both doses of COVID-19 vaccine, second dose 3 weeks ago.  Soon after her hospital discharge the patient was doing okay on 2 L supplemental oxygen but then again, she started becoming hypoxic with pulse ox in the 60s.  Her oxygen requirement has increased over the past few days. 4/21, despite being on 6 L supplemental oxygen, patient's oxygen saturation continued to drop to 70s.  She started being more short of breath and hence was brought to the ED.  In the ED, patient required 6 L oxygen by nasal cannula.  She was tachypneic.  Not tachycardic.  WBC count elevated to 15.1.  Blood glucose 232.   Covid antigen negative. Chest x-ray showing diffuse bilateral and lower lung field predominant streaky densities which may represent atelectasis or atypical infection. Patient received albuterol and IV Lasix 20 mg. Patient was admitted under hospitalist service for further evaluation management. See below for  details  Subjective: Patient was seen and examined this morning.   Events from last night noted.  Patient was tried on CPAP but could not tolerate and started to drop oxygen saturation.  She was placed on nonrebreather.  The setting.  This morning, she was at 15 L when I saw her.  Her father was at bedside. Patient is not on IV fluid.  No history of CHF. Echocardiogram from yesterday showed 55% EF with grade 2 diastolic dysfunction. Later in the morning today, patient was able to ambulate on the hallway on 6 L oxygen.  Assessment/Plan: Acute hypoxic respiratory failure recent hospitalization for acute hypoxic respiratory failure secondary to phrenic nerve paralysis.   Possible acute exacerbation of diastolic CHF. -Not on supplemental oxygen at home. Since admission, patient has been requiring nonrebreather in the night while only 6 L during the day.  CPAP was tried last night but patient was not able to tolerate.  She was placed back to nonrebreather again.  -Chest x-ray on admission showed diffuse bilateral and lower lung field predominant streaky densities which may represent atelectasis or atypical infection.  -CT angio chest was obtained 4/23 did not show any pulmonary embolism but continued to show diffuse bilateral infiltrates. -Pulmonary consultation appreciated.  Currently on IV Unasyn. -Also given Lasix 40 mg IV daily yesterday with suspicion of fluid overload/CHF exacerbation.. -Patient continues to remain on supplemental oxygen today.  No fever.  WBC count normal. -Continue incentive spirometry.  Hyperglycemia Noninsulin-dependent type 2 diabetes: A1c 8.2 on 4/21 -Home meds include Metformin, Ozempic, Jardiance.  -Currently oral meds on hold.  Diabetes  coordinator consulted.  Started on Lantus 10 units nightly and sliding scale insulin with Accu-Cheks.  Blood glucose level remains elevated more than 200 today.  I will increase Lantus nightly dose to 15 units for tonight.  Gout:   Continue allopurinol  Hypothyroidism:  Continue Synthroid.  DVT prophylaxis:  Lovenox subcu Antimicrobials:  IV Unasyn Fluid: Not on IV fluids Diet: Medium carb diet  Consultants: None Family Communication:    Status is: Inpatient  Remains inpatient appropriate because:IV treatments appropriate due to intensity of illness or inability to take PO   Dispo: The patient is from: Home              Anticipated d/c is to: Home              Anticipated d/c date is: 2 to 3 days              Patient currently is not medically stable to d/c.  Antimicrobials: Anti-infectives (From admission, onward)   Start     Dose/Rate Route Frequency Ordered Stop   05/26/19 2115  azithromycin (ZITHROMAX) 500 mg in sodium chloride 0.9 % 250 mL IVPB  Status:  Discontinued     500 mg 250 mL/hr over 60 Minutes Intravenous Every 24 hours 05/26/19 2101 05/28/19 1454   05/26/19 2115  Ampicillin-Sulbactam (UNASYN) 3 g in sodium chloride 0.9 % 100 mL IVPB     3 g 200 mL/hr over 30 Minutes Intravenous Every 6 hours 05/26/19 2113          Code Status: Full Code   Diet Order            Diet regular Room service appropriate? Yes; Fluid consistency: Thin  Diet effective now              Infusions:  . ampicillin-sulbactam (UNASYN) IV Stopped (05/29/19 [id number])    Scheduled Meds: . allopurinol  300 mg Oral Daily  . cholestyramine  4 g Oral BID  . enoxaparin (LOVENOX) injection  40 mg Subcutaneous Q24H  . insulin aspart  0-5 Units Subcutaneous QHS  . insulin aspart  0-9 Units Subcutaneous TID WC  . insulin glargine  15 Units Subcutaneous QHS  . levonorgestrel-ethinyl estradiol  1 tablet Oral Daily  . levothyroxine  137 mcg Oral Q0600    PRN meds: acetaminophen **OR** acetaminophen, albuterol, guaiFENesin, ipratropium-albuterol   Objective: Vitals:   05/29/19 0900 05/29/19 1209  BP: 131/88 (!) 133/93  Pulse: 91 95  Resp: 20 16  Temp: 98 F (36.7 C) 98 F (36.7 C)  SpO2: 90% 95%     Intake/Output Summary (Last 24 hours) at 05/29/2019 1346 Last data filed at 05/29/2019 0900 Gross per 24 hour  Intake 680 ml  Output 2100 ml  Net -1420 ml   Filed Weights   05/28/19 0621 05/29/19 0658  Weight: 110.3 kg 109.9 kg   Weight change: -0.439 kg Body mass index is 41.57 kg/m.   Physical Exam: General exam: On high flow oxygen by nasal cannula Skin: No rashes, lesions or ulcers. HEENT: Atraumatic, normocephalic, supple neck, no obvious bleeding Lungs: Minimal crackles bilaterally on the basis.  No wheezing.   CVS: Regular rate and rhythm, no murmur GI/Abd soft, nontender, distended from obesity, bowel sound present CNS: Alert, awake, baseline issues with Down syndrome. Psychiatry: Mood appropriate Extremities: No pedal edema, no calf tenderness  Data Review: I have personally reviewed the laboratory data and studies available.  Recent Labs  Lab 05/26/19 1754 05/26/19 1754 05/26/19 2142  05/27/19 0057 05/27/19 0431 05/28/19 0315 05/29/19 0248  WBC 15.1*  --   --   --  10.8* 8.9 8.5  NEUTROABS 12.3*  --   --   --   --  5.2 4.9  HGB 11.1*   < > 13.3 11.6* 10.9* 10.7* 11.7*  HCT 36.7   < > 39.0 34.0* 35.9* 34.9* 38.6  MCV 94.8  --   --   --  94.5 94.8 93.5  PLT 264  --   --   --  275 287 306   < > = values in this interval not displayed.   Recent Labs  Lab 05/23/19 0607 05/23/19 0607 05/26/19 1754 05/26/19 2142 05/27/19 0057 05/28/19 0315 05/29/19 0248  NA 137   < > 138 137 137 139 140  K 4.2   < > 4.6 4.1 4.0 3.7 3.7  CL 101  --  99  --   --  98 97*  CO2 23  --  26  --   --  28 30  GLUCOSE 178*  --  232*  --   --  178* 201*  BUN 6  --  9  --   --  7 5*  CREATININE 0.58  --  0.81  --   --  0.64 0.56  CALCIUM 8.3*  --  8.5*  --   --  8.4* 8.7*   < > = values in this interval not displayed.    Signed, Terrilee Croak, MD [facility] Pager: [phone] (Secure Chat preferred). 05/29/2019

## 2019-05-30 ENCOUNTER — Inpatient Hospital Stay (HOSPITAL_COMMUNITY): Payer: Medicare HMO

## 2019-05-30 LAB — CBC WITH DIFFERENTIAL/PLATELET
Abs Immature Granulocytes: 0.85 10*3/uL — ABNORMAL HIGH (ref 0.00–0.07)
Basophils Absolute: 0.2 10*3/uL — ABNORMAL HIGH (ref 0.0–0.1)
Basophils Relative: 2 %
Eosinophils Absolute: 0.2 10*3/uL (ref 0.0–0.5)
Eosinophils Relative: 3 %
HCT: 36.8 % (ref 36.0–46.0)
Hemoglobin: 11 g/dL — ABNORMAL LOW (ref 12.0–15.0)
Immature Granulocytes: 9 %
Lymphocytes Relative: 26 %
Lymphs Abs: 2.4 10*3/uL (ref 0.7–4.0)
MCH: 28.1 pg (ref 26.0–34.0)
MCHC: 29.9 g/dL — ABNORMAL LOW (ref 30.0–36.0)
MCV: 93.9 fL (ref 80.0–100.0)
Monocytes Absolute: 0.9 10*3/uL (ref 0.1–1.0)
Monocytes Relative: 10 %
Neutro Abs: 4.7 10*3/uL (ref 1.7–7.7)
Neutrophils Relative %: 50 %
Platelets: 315 10*3/uL (ref 150–400)
RBC: 3.92 MIL/uL (ref 3.87–5.11)
RDW: 16.2 % — ABNORMAL HIGH (ref 11.5–15.5)
WBC: 9.3 10*3/uL (ref 4.0–10.5)
nRBC: 0.5 % — ABNORMAL HIGH (ref 0.0–0.2)

## 2019-05-30 LAB — BASIC METABOLIC PANEL
Anion gap: 12 (ref 5–15)
BUN: 5 mg/dL — ABNORMAL LOW (ref 6–20)
CO2: 29 mmol/L (ref 22–32)
Calcium: 8.6 mg/dL — ABNORMAL LOW (ref 8.9–10.3)
Chloride: 98 mmol/L (ref 98–111)
Creatinine, Ser: 0.59 mg/dL (ref 0.44–1.00)
GFR calc Af Amer: >60 mL/min (ref >60)
GFR calc non Af Amer: >60 mL/min (ref >60)
Glucose, Bld: 203 mg/dL — ABNORMAL HIGH (ref 70–99)
Potassium: 3.7 mmol/L (ref 3.5–5.1)
Sodium: 139 mmol/L (ref 135–145)

## 2019-05-30 LAB — GLUCOSE, CAPILLARY
Glucose-Capillary: 200 mg/dL — ABNORMAL HIGH (ref 70–99)
Glucose-Capillary: 278 mg/dL — ABNORMAL HIGH (ref 70–99)
Glucose-Capillary: 237 mg/dL — ABNORMAL HIGH (ref 70–99)
Glucose-Capillary: 268 mg/dL — ABNORMAL HIGH (ref 70–99)

## 2019-05-30 LAB — PROCALCITONIN: Procalcitonin: <0.1 ng/mL

## 2019-05-30 MED ORDER — SODIUM CHLORIDE 0.9% FLUSH: 10.0000 mL | INTRAVENOUS | Status: DC | PRN

## 2019-05-30 MED ORDER — SODIUM CHLORIDE 0.9% FLUSH: 10.0000 mL | Freq: Two times a day (BID) | INTRAVENOUS | Status: DC

## 2019-05-30 NOTE — Progress Notes (Signed)
NAME:  Wanda Oneill, MRN:  [id number], DOB:  Oct 08, 1980, LOS: 4 ADMISSION DATE:  05/26/2019, CONSULTATION DATE:  4/23 REFERRING MD:  Rance Muir, CHIEF COMPLAINT:  Hypoxia    Brief History   39 year old female with history of Down syndrome and as well as subacute elevated right hemidiaphragm following shoulder surgery felt possibly due to phrenic nerve injury.  Admitted acutely with acute on chronic hypoxic respiratory failure Portable chest x-ray obtained shows diffuse patchy bilateral airspace disease right predominantly more than left.  Placed on supplemental oxygen, empiric antibiotics, and administered IV diuresis.  Noted to have worsening nocturnal hypoxia and because of this pulmonary asked to evaluate.    Past  Medical History   Downs syndrome, NIDDD. GERD, Gastritis, Gout, MO (wt 110.kg w/ BMI 41.74), Recent bone spur shaving right shoulder (4/15) w/ interscalene block w/ sub-acute elevated right HD felt either 2/2 phrenic nerve injury vs RML collapse.  Significant Hospital Events   4/21 admitted, placed on empiric antibiotics 4/22 noted to have nocturnal hypoxia with pulse oximetry in the 80s on 7 L when sleeping, rapidly improved when awake 4/23 ambulating in hallway 300 feet on 6 L, pulse oximetry 85-94%  Consults:  pulm consult   Procedures:    Significant Diagnostic Tests:  Clinical/Bedside Swallow Evaluation>> Mild aspiration risk 4/24 Oral mechanism exam was unremarkable.  She consumed trials of thin liquid and preferred solids (grapes).  Mastication was timely and suspect timely swallow initiation.  Pt was noted to hold liquid in her oral cavity briefly prior to swallow initiation, but this was functional.  Delayed throat clearing was observed intermittently following PO trials.  Suspect that it was related to baseline cough, but unable to r/o laryngeal penetration or aspiration.  Pt reported that some of the meats on the regular diet were difficult to masticate, therefore  recommend diet change to Dysphagia 3 (soft) solid with continuation of thin liquid.  Pt will benefit from the following compensatory strategies: 1) Small bites/sips 2) Slow rate of intake 3) Take a break if SOB 4) Sit upright as possible.  SLP will f/u to monitor diet tolerance.    Pt was unhappy with the limitations of the Dysphagia 3 diet and that she wished to resume a regular solid diet.  Diet was changed back to regular solids (carb modified) with note in diet order to please pre-cut meats per pt request in order to make self-feeding easier.   CT angiogram 4/23:  Negative for pulmonary emboli, did demonstrate diffuse bilateral pulmonary infiltrates consistent with multifocal pneumonia. Echocardiogram 4/22: LVEF 55% left ventricular diastolic parameters grade 2 diastolic dysfunction RV function normal moderately elevated PA systolic pressure estimated at 52.9 mmHg right atrial pressure 50 mmHg  Micro Data:  Blood culture x2 4/22 >> ng Respiratory panel by PCR negative Coronavirus, influenza a and B by PCR all negative  Antimicrobials:  Unasyn 4/21 azithromycin 4/21 Interim history/subjective:   Afebrile Again required 10 L overnight, did not tolerate CPAP and placed on Ventimask This a.m. back down to 6 L, sitting in a chair, no distress  Objective   Blood pressure 107/81, pulse 88, temperature 98.2 F (36.8 C), temperature source Oral, resp. rate 18, weight 109.8 kg, SpO2 96 %.    FiO2 (%):  [40 %] 40 %   Intake/Output Summary (Last 24 hours) at 05/30/2019 1457 Last data filed at 05/30/2019 0900 Gross per 24 hour  Intake 842.5 ml  Output 500 ml  Net 342.5 ml   [employer]  05/28/19 0621 05/29/19 0658 05/30/19 0514  Weight: 110.3 kg 109.9 kg 109.8 kg    Examination: General: Young woman, sitting up in chair, very pleasant and co-operative  HENT: Mucous membranes moist no JVD, wearing [place] at 5 L, Down's features Lungs: Bilateral chest excursion with rhonchi throughout,  diminished per  bases currently on 5 L nasal cannula, no accessory use, speaking in full sentences. Cardiovascular: S1, S2, Regular rate and rhythm without murmur rub or gallop Abdomen: Soft nontender no organomegaly  Extremities: Warm and dry, trace lower extremity edema, brisk capillary refill. Neuro:  awake oriented no focal deficit  Chest x-ray 4/24 shows improvement in bilateral infiltrates especially on the right   Resolved Hospital Problem list     Assessment & Plan:  Acute hypoxic respiratory failure in the setting of diffuse bilateral pulmonary infiltrates/ multifocal pneumonia Right diaphragm paralysis post interscalene block -resolved  Plan I was able to drop her oxygen to 5 L with good saturations Continue  Unasyn ( started 4/21) -plan 7 days, can transition to oral Augmentin soon  Ideally dysphagia 3 diet-were changed to regular at her request  Nocturnal hypoxia Suspect sleep apnea, BMI 41 Per Mom she does not snore. She was tested 16 years ago, and this was negative Did not tolerate CPAP last 2 nights  Plan NPSG as outpatient Consider nasal pillows   Updated father on the phone  Kara Mead MD. [facility]. [facility]  If no response to pager , please call [phone]   05/30/2019

## 2019-05-30 NOTE — Progress Notes (Signed)
PROGRESS NOTE  Wanda Oneill  DOB: January 01, 1981  PCP: Darreld Mclean, MD [id number]  DOA: 05/26/2019  LOS: 4 days   Chief Complaint  Patient presents with  . Shortness of Breath  . hypoxia   Brief narrative: Wanda Oneill is a 39 y.o. female with PMH significant for Down syndrome, non-insulin-dependent type 2 diabetes, GERD, gastritis, hepatic steatosis, gout, hypothyroidism, IBS, morbid obesity (BMI 44.24). Patient presented to the ED for shortness of breath, hypoxia. 4/15, patient underwent elective bone spur shaving of the right shoulder as an outpatient under interscalene block.  Since then, patient started having drop in oxygen saturation and was brought to the ED post procedure.  Chest x-ray is showed elevated right hemidiaphragm.  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  She required high flow oxygen therapy and was subsequently discharged home on 2 L oxygen by nasal cannula on 4/18.  She is status post both doses of COVID-19 vaccine, second dose 3 weeks ago.  Soon after her hospital discharge the patient was doing okay on 2 L supplemental oxygen but then again, she started becoming hypoxic with pulse ox in the 60s.  Her oxygen requirement has increased over the past few days. 4/21, despite being on 6 L supplemental oxygen, patient's oxygen saturation continued to drop to 70s.  She started being more short of breath and hence was brought to the ED.  In the ED, patient required 6 L oxygen by nasal cannula.  She was tachypneic.  Not tachycardic.  WBC count elevated to 15.1.  Blood glucose 232.   Covid antigen negative. Chest x-ray showing diffuse bilateral and lower lung field predominant streaky densities which may represent atelectasis or atypical infection. Patient received albuterol and IV Lasix 20 mg. Patient was admitted under hospitalist service for further evaluation management. See below for  details.  Subjective: Patient was seen and examined this morning.   Unable to tolerate CPAP.  Required up to 10 L of oxygen last night.  On 7 L this morning.   Family not at bedside.  Patient denies any complaint.  Assessment/Plan: Acute hypoxic respiratory failure recent hospitalization for acute hypoxic respiratory failure secondary to phrenic nerve paralysis.   Possible acute exacerbation of diastolic CHF. -Not on supplemental oxygen at home.  -Chest x-ray on admission showed diffuse bilateral and lower lung field predominant streaky densities which may represent atelectasis or atypical infection.  -CT angio chest was obtained 4/23 did not show any pulmonary embolism but continued to show diffuse bilateral infiltrates. -Initially required high flow oxygen nasal cannula during the day and nonrebreather at night.  For the last 24 hours, patient is on 10 L or less of supplemental oxygen.  Continue to wean down as tolerated. -On IV Unasyn.  Only follow-up appreciated. -Continue sliding scale insulin.  Continue to ambulate  Hyperglycemia Noninsulin-dependent type 2 diabetes: A1c 8.2 on 4/21 -Home meds include Metformin, Ozempic, Jardiance.  -Currently oral meds on hold.  Diabetes coordinator consulted.   -Currently on 15 units Lantus sliding scale insulin with Accu-Cheks.    Gout:  Continue allopurinol  Hypothyroidism:  Continue Synthroid.  DVT prophylaxis:  Lovenox subcu Antimicrobials:  IV Unasyn Fluid: Not on IV fluids Diet: Medium carb diet  Consultants: None Family Communication:    Status is: Inpatient  Remains inpatient appropriate because:IV treatments appropriate due to intensity of illness or inability to take PO   Dispo: The patient is from: Home  Anticipated d/c is to: Home              Anticipated d/c date is: 2 to 3 days              Patient currently is not medically stable to d/c.  Antimicrobials: Anti-infectives (From admission, onward)    Start     Dose/Rate Route Frequency Ordered Stop   05/26/19 2115  azithromycin (ZITHROMAX) 500 mg in sodium chloride 0.9 % 250 mL IVPB  Status:  Discontinued     500 mg 250 mL/hr over 60 Minutes Intravenous Every 24 hours 05/26/19 2101 05/28/19 1454   05/26/19 2115  Ampicillin-Sulbactam (UNASYN) 3 g in sodium chloride 0.9 % 100 mL IVPB     3 g 200 mL/hr over 30 Minutes Intravenous Every 6 hours 05/26/19 2113          Code Status: Full Code   Diet Order            Diet regular Room service appropriate? Yes; Fluid consistency: Thin  Diet effective now              Infusions:  . ampicillin-sulbactam (UNASYN) IV 3 g (05/30/19 1239)    Scheduled Meds: . allopurinol  300 mg Oral Daily  . cholestyramine  4 g Oral BID  . enoxaparin (LOVENOX) injection  40 mg Subcutaneous Q24H  . insulin aspart  0-5 Units Subcutaneous QHS  . insulin aspart  0-9 Units Subcutaneous TID WC  . insulin glargine  15 Units Subcutaneous QHS  . levonorgestrel-ethinyl estradiol  1 tablet Oral Daily  . levothyroxine  137 mcg Oral Q0600    PRN meds: acetaminophen **OR** acetaminophen, albuterol, guaiFENesin, ipratropium-albuterol   Objective: Vitals:   05/30/19 0800 05/30/19 1150  BP: 112/85 107/81  Pulse: 92 88  Resp: 20 18  Temp: 98 F (36.7 C) 98.2 F (36.8 C)  SpO2: 95% 96%    Intake/Output Summary (Last 24 hours) at 05/30/2019 1328 Last data filed at 05/30/2019 0900 Gross per 24 hour  Intake 1380 ml  Output 500 ml  Net 880 ml   Filed Weights   05/28/19 0621 05/29/19 0658 05/30/19 0514  Weight: 110.3 kg 109.9 kg 109.8 kg   Weight change: -0.061 kg Body mass index is 41.55 kg/m.   Physical Exam: General exam: On high flow oxygen by nasal cannula Skin: No rashes, lesions or ulcers. HEENT: Atraumatic, normocephalic, supple neck, no obvious bleeding Lungs: Minimal crackles bilaterally on the basis.  No wheezing.   CVS: Regular rate and rhythm, no murmur GI/Abd soft, nontender,  distended from obesity, bowel sound present CNS: Alert, awake, baseline issues with Down syndrome. Psychiatry: Mood appropriate Extremities: No pedal edema, no calf tenderness  Data Review: I have personally reviewed the laboratory data and studies available.  Recent Labs  Lab 05/26/19 1754 05/26/19 2142 05/27/19 0057 05/27/19 0431 05/28/19 0315 05/29/19 0248 05/30/19 0316  WBC 15.1*  --   --  10.8* 8.9 8.5 9.3  NEUTROABS 12.3*  --   --   --  5.2 4.9 4.7  HGB 11.1*   < > 11.6* 10.9* 10.7* 11.7* 11.0*  HCT 36.7   < > 34.0* 35.9* 34.9* 38.6 36.8  MCV 94.8  --   --  94.5 94.8 93.5 93.9  PLT 264  --   --  275 287 306 315   < > = values in this interval not displayed.   Recent Labs  Lab 05/26/19 1754 05/26/19 1754 05/26/19 2142 05/27/19 0057  05/28/19 0315 05/29/19 0248 05/30/19 0316  NA 138   < > 137 137 139 140 139  K 4.6   < > 4.1 4.0 3.7 3.7 3.7  CL 99  --   --   --  98 97* 98  CO2 26  --   --   --  28 30 29   GLUCOSE 232*  --   --   --  178* 201* 203*  BUN 9  --   --   --  7 5* 5*  CREATININE 0.81  --   --   --  0.64 0.56 0.59  CALCIUM 8.5*  --   --   --  8.4* 8.7* 8.6*   < > = values in this interval not displayed.    Signed, Terrilee Croak, MD [facility] Pager: [phone] (Secure Chat preferred). 05/30/2019

## 2019-05-30 NOTE — Progress Notes (Signed)
Pt refused cpap again tonight. Went to sleep with Evaro in on 5L O2 went down to 86-88%. Switched to venturi mask on 8L O2 came up to 90% but once she went to sleep it dropped down to 87%. Switched to NRB on 8L O2 @ 92%. Will continue to monitor pt.

## 2019-05-30 NOTE — Plan of Care (Signed)

## 2019-05-31 ENCOUNTER — Inpatient Hospital Stay (HOSPITAL_COMMUNITY): Payer: Medicare HMO

## 2019-05-31 LAB — POCT I-STAT EG7
Acid-Base Excess: 6 mmol/L — ABNORMAL HIGH (ref 0.0–2.0)
Bicarbonate: 31.9 mmol/L — ABNORMAL HIGH (ref 20.0–28.0)
Calcium, Ion: 1.11 mmol/L — ABNORMAL LOW (ref 1.15–1.40)
HCT: 39 % (ref 36.0–46.0)
Hemoglobin: 13.3 g/dL (ref 12.0–15.0)
O2 Saturation: 74 %
Potassium: 4.1 mmol/L (ref 3.5–5.1)
Sodium: 137 mmol/L (ref 135–145)
TCO2: 33 mmol/L — ABNORMAL HIGH (ref 22–32)
pCO2, Ven: 49.2 mmHg (ref 44.0–60.0)
pH, Ven: 7.42 (ref 7.250–7.430)
pO2, Ven: 39 mmHg (ref 32.0–45.0)

## 2019-05-31 LAB — GLUCOSE, CAPILLARY
Glucose-Capillary: 212 mg/dL — ABNORMAL HIGH (ref 70–99)
Glucose-Capillary: 165 mg/dL — ABNORMAL HIGH (ref 70–99)
Glucose-Capillary: 241 mg/dL — ABNORMAL HIGH (ref 70–99)
Glucose-Capillary: 250 mg/dL — ABNORMAL HIGH (ref 70–99)
Glucose-Capillary: 283 mg/dL — ABNORMAL HIGH (ref 70–99)

## 2019-05-31 MED ORDER — INSULIN GLARGINE 100 UNIT/ML ~~LOC~~ SOLN
20.0000 [IU] | Freq: Every day | SUBCUTANEOUS | Status: DC
  Administered 2019-05-31: 20 [IU] via SUBCUTANEOUS
  Filled 2019-05-31 (×2): qty 0.2

## 2019-05-31 MED ORDER — FUROSEMIDE 10 MG/ML IJ SOLN
40.0000 mg | Freq: Once | INTRAMUSCULAR | Status: AC
  Administered 2019-05-31: 40 mg via INTRAVENOUS
  Filled 2019-05-31: qty 4

## 2019-05-31 MED ORDER — POTASSIUM CHLORIDE 20 MEQ/15ML (10%) PO SOLN
40.0000 meq | Freq: Once | ORAL | Status: AC
  Administered 2019-05-31: 40 meq via ORAL
  Filled 2019-05-31: qty 30

## 2019-05-31 MED ORDER — PANTOPRAZOLE SODIUM 40 MG PO TBEC
40.0000 mg | DELAYED_RELEASE_TABLET | Freq: Two times a day (BID) | ORAL | Status: DC
  Administered 2019-05-31 – 2019-06-01 (×2): 40 mg via ORAL
  Filled 2019-05-31 (×3): qty 1

## 2019-05-31 NOTE — Progress Notes (Signed)
Inpatient Diabetes Program Recommendations  AACE/ADA: New Consensus Statement on Inpatient Glycemic Control (2015)  Target Ranges:  Prepandial:   less than 140 mg/dL      Peak postprandial:   less than 180 mg/dL (1-2 hours)      Critically ill patients:  140 - 180 mg/dL   Lab Results  Component Value Date   GLUCAP 241 (H) 05/31/2019   HGBA1C 8.2 (H) 05/26/2019    Review of Glycemic Control Results for Orsak, Wanda BAHN (MRN [id number]) as of 05/31/2019 15:26  Ref. Range 05/30/2019 11:51 05/30/2019 16:46 05/30/2019 21:45 05/31/2019 06:39 05/31/2019 12:04  Glucose-Capillary Latest Ref Range: 70 - 99 mg/dL 278 (H) 237 (H) 268 (H) 165 (H) 241 (H)   Diabetes history: DM 2 Outpatient Diabetes medications:  Metformin 1000 mg bid, Ozempic 0.5 mg weekly Current orders for Inpatient glycemic control:  Novolog sensitive tid with meals and HS Lantus 20 units q HS Inpatient Diabetes Program Recommendations:    Please consider adding Novolog meal coverage 3 units tid with meals while in the hospital?  Thanks  Adah Perl, RN, BC-ADM Inpatient Diabetes Coordinator Pager [phone] (8a-5p)

## 2019-05-31 NOTE — Progress Notes (Addendum)
NAME:  Wanda Oneill, MRN:  [id number], DOB:  1980-07-02, LOS: 5 ADMISSION DATE:  05/26/2019, CONSULTATION DATE:  4/23 REFERRING MD:  Rance Muir, CHIEF COMPLAINT:  Hypoxia    Brief History   39 year old female with history of Down syndrome and as well as subacute elevated right hemidiaphragm following shoulder surgery felt possibly due to phrenic nerve injury.  Admitted acutely with acute on chronic hypoxic respiratory failure Portable chest x-ray obtained shows diffuse patchy bilateral airspace disease right predominantly more than left.  Placed on supplemental oxygen, empiric antibiotics, and administered IV diuresis.  Noted to have worsening nocturnal hypoxia and because of this pulmonary asked to evaluate.    Past  Medical History   Downs syndrome, NIDDD. GERD, Gastritis, Gout, MO (wt 110.kg w/ BMI 41.74), Recent bone spur shaving right shoulder (4/15) w/ interscalene block w/ sub-acute elevated right HD felt either 2/2 phrenic nerve injury vs RML collapse.  Significant Hospital Events   4/21 admitted, placed on empiric antibiotics 4/22 noted to have nocturnal hypoxia with pulse oximetry in the 80s on 7 L when sleeping, rapidly improved when awake 4/23 ambulating in hallway 300 feet on 6 L, pulse oximetry 85-94%  Consults:  pulm consult   Procedures:    Significant Diagnostic Tests:  Clinical/Bedside Swallow Evaluation>> Mild aspiration risk 4/24 Oral mechanism exam was unremarkable.  She consumed trials of thin liquid and preferred solids (grapes).  Mastication was timely and suspect timely swallow initiation.  Pt was noted to hold liquid in her oral cavity briefly prior to swallow initiation, but this was functional.  Delayed throat clearing was observed intermittently following PO trials.  Suspect that it was related to baseline cough, but unable to r/o laryngeal penetration or aspiration.  Pt reported that some of the meats on the regular diet were difficult to masticate, therefore  recommend diet change to Dysphagia 3 (soft) solid with continuation of thin liquid.  Pt will benefit from the following compensatory strategies: 1) Small bites/sips 2) Slow rate of intake 3) Take a break if SOB 4) Sit upright as possible.  SLP will f/u to monitor diet tolerance.    Pt was unhappy with the limitations of the Dysphagia 3 diet and that she wished to resume a regular solid diet.  Diet was changed back to regular solids (carb modified) with note in diet order to please pre-cut meats per pt request in order to make self-feeding easier.   CT angiogram 4/23:  Negative for pulmonary emboli, did demonstrate diffuse bilateral pulmonary infiltrates consistent with multifocal pneumonia. Echocardiogram 4/22: LVEF 55% left ventricular diastolic parameters grade 2 diastolic dysfunction RV function normal moderately elevated PA systolic pressure estimated at 52.9 mmHg right atrial pressure 50 mmHg  Micro Data:  Blood culture x2 4/22 >> ng Respiratory panel by PCR negative Coronavirus, influenza a and B by PCR all negative  Antimicrobials:  Unasyn 4/21 azithromycin 4/21 Interim history/subjective:  No distress. Still reports cough   Objective   Blood pressure 134/87, pulse 89, temperature 97.6 F (36.4 C), temperature source Oral, resp. rate (Abnormal) 22, weight 111.5 kg, SpO2 95 %.        Intake/Output Summary (Last 24 hours) at 05/31/2019 0855 Last data filed at 05/30/2019 1830 Gross per 24 hour  Intake 922.5 ml  Output 700 ml  Net 222.5 ml   Filed Weights   05/29/19 0658 05/30/19 0514 05/31/19 0336  Weight: 109.9 kg 109.8 kg 111.5 kg    Examination: General this is a 39 year old  white female resting in bed not in distress. Still coughs after PO intake HENT NCAT no JVD MMM Pulm clear, some rhonchi that improve after cough still on 5 liters Card RRR abd not tender  Ext no edema  Neuro intact   Resolved Hospital Problem list   Right diaphragm paralysis post interscalene  block -resolved  Assessment & Plan:   Acute hypoxic respiratory failure  Acute diastolic heart failure  Pulmonary edema  Multifocal PNA (presumed aspiration) Dysphagia GERD Nocturnal hypoxia    Problem list:  Acute hypoxic respiratory failure in the setting of diffuse bilateral pulmonary infiltrates: favor combination of pulmonary edema from acute diastolic HF (perhaps from untreated sleep apnea) & aspiration PNA -she continues to complain of nocturnal cough. Eval of her CT scan is negative for hiatal hernia but this would not exclude reflux w/ subsequent penetration of gastric secretions. PCXR (most recent one)showed improved aeration..  ->still on 5 liters Plan Cont supplemental oxygen and wean as able Walking oximetry prior to dc Day 6/7 abx Will add PPI bid for now Get esophagram looking for evidence of reflux She refused d3 diet but may at least be open to reflux precautions if needed.  Repeat lasix today   Nocturnal hypoxia Suspect sleep apnea, BMI 41 She was tested 16 years ago, and this was negative Plan NPSG as outpt  Will get her follow up in our clinic   Erick Colace ACNP-BC [place] Pager # [phone] OR # [phone] if no answer

## 2019-05-31 NOTE — Progress Notes (Signed)
PROGRESS NOTE  Wanda Oneill  DOB: Feb 16, 1980  PCP: Darreld Mclean, MD [id number]  DOA: 05/26/2019  LOS: 5 days   Chief Complaint  Patient presents with  . Shortness of Breath  . hypoxia   Brief narrative: Wanda Oneill is a 39 y.o. female with PMH significant for Down syndrome, non-insulin-dependent type 2 diabetes, GERD, gastritis, hepatic steatosis, gout, hypothyroidism, IBS, morbid obesity (BMI 44.24). Patient presented to the ED for shortness of breath, hypoxia. 4/15, patient underwent elective bone spur shaving of the right shoulder as an outpatient under interscalene block.  Since then, patient started having drop in oxygen saturation and was brought to the ED post procedure.  Chest x-ray is showed elevated right hemidiaphragm.  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  She required high flow oxygen therapy and was subsequently discharged home on 2 L oxygen by nasal cannula on 4/18.  She is status post both doses of COVID-19 vaccine, second dose 3 weeks ago.  Soon after her hospital discharge the patient was doing okay on 2 L supplemental oxygen but then again, she started becoming hypoxic with pulse ox in the 60s.  Her oxygen requirement has increased over the past few days. 4/21, despite being on 6 L supplemental oxygen, patient's oxygen saturation continued to drop to 70s.  She started being more short of breath and hence was brought to the ED.  In the ED, patient required 6 L oxygen by nasal cannula.  She was tachypneic.  Not tachycardic.  WBC count elevated to 15.1.  Blood glucose 232.   Covid antigen negative. Chest x-ray showing diffuse bilateral and lower lung field predominant streaky densities which may represent atelectasis or atypical infection. Patient received albuterol and IV Lasix 20 mg. Patient was admitted under hospitalist service for further evaluation management. See below for  details.  Subjective: Patient was seen and examined this morning.   Did not tolerate CPAP last night either.  Coughing more this morning. Still remains on 5 L oxygen by nasal cannula.  Assessment/Plan: Acute hypoxic respiratory failure Multifactorial : Combination of aspiration pneumonia, Acute exacerbation of diastolic CHF.  Possible underlying sleep apnea -Not on supplemental oxygen at home.  -Patient had recent hospitalization for acute hypoxic respiratory failure secondary to phrenic nerve paralysis.  Over several days, phrenic nerve paralysis improved but patient's oxygen requirement continued to worsen and hence presented to the ED. -Chest x-ray on admission showed diffuse bilateral and lower lung field predominant streaky densities which may represent atelectasis or atypical infection.  -CT angio chest was obtained 4/23 did not show any pulmonary embolism but continued to show diffuse bilateral infiltrates. -Patient was started CPAP in the night for suspected coexisting sleep apnea.  However patient is not able to tolerate CPAP.  -Currently on 5 L oxygen during the day and higher flow in the night.  -On IV Unasyn day 6/7.  Procalcitonin down to normal -As needed Mucinex for cough. -Got 2 doses of IV Lasix few days ago.  Not on scheduled diuretics.  Does not look fluid overloaded.  Hyperglycemia Noninsulin-dependent type 2 diabetes: A1c 8.2 on 4/21 -Home meds include Metformin, Ozempic, Jardiance.  -Currently oral meds on hold.  Diabetes coordinator consulted.   -Currently on 15 units Lantus sliding scale insulin with Accu-Cheks.  Blood sugar running mostly over 250. -Increase Lantus dose to 20 units tonight.  Gout:  Continue allopurinol  Hypothyroidism:  Continue Synthroid.  DVT prophylaxis:  Lovenox subcu Antimicrobials:  IV Unasyn Fluid: Not on IV fluids Diet: Medium carb diet  Consultants: None Family Communication:    Status is: Inpatient  Remains inpatient  appropriate because:IV treatments appropriate due to intensity of illness or inability to take PO  Dispo: The patient is from: Home              Anticipated d/c is to: Home              Anticipated d/c date is: 1 to 2 days              Patient currently is not medically stable to d/c.  Antimicrobials: Anti-infectives (From admission, onward)   Start     Dose/Rate Route Frequency Ordered Stop   05/26/19 2115  azithromycin (ZITHROMAX) 500 mg in sodium chloride 0.9 % 250 mL IVPB  Status:  Discontinued     500 mg 250 mL/hr over 60 Minutes Intravenous Every 24 hours 05/26/19 2101 05/28/19 1454   05/26/19 2115  Ampicillin-Sulbactam (UNASYN) 3 g in sodium chloride 0.9 % 100 mL IVPB     3 g 200 mL/hr over 30 Minutes Intravenous Every 6 hours 05/26/19 2113          Code Status: Full Code   Diet Order            Diet regular Room service appropriate? Yes; Fluid consistency: Thin  Diet effective now              Infusions:  . ampicillin-sulbactam (UNASYN) IV 3 g (05/31/19 0522)    Scheduled Meds: . allopurinol  300 mg Oral Daily  . cholestyramine  4 g Oral BID  . enoxaparin (LOVENOX) injection  40 mg Subcutaneous Q24H  . insulin aspart  0-5 Units Subcutaneous QHS  . insulin aspart  0-9 Units Subcutaneous TID WC  . insulin glargine  20 Units Subcutaneous QHS  . levonorgestrel-ethinyl estradiol  1 tablet Oral Daily  . levothyroxine  137 mcg Oral Q0600  . pantoprazole  40 mg Oral BID AC  . sodium chloride flush  10-40 mL Intracatheter Q12H    PRN meds: acetaminophen **OR** acetaminophen, albuterol, guaiFENesin, ipratropium-albuterol, sodium chloride flush   Objective: Vitals:   05/31/19 0336 05/31/19 0814  BP:  134/87  Pulse:  89  Resp: (!) 21 (!) 22  Temp:  97.6 F (36.4 C)  SpO2:  95%    Intake/Output Summary (Last 24 hours) at 05/31/2019 1125 Last data filed at 05/30/2019 1830 Gross per 24 hour  Intake 580 ml  Output 500 ml  Net 80 ml   Filed Weights   05/29/19  0658 05/30/19 0514 05/31/19 0336  Weight: 109.9 kg 109.8 kg 111.5 kg   Weight change: 1.694 kg Body mass index is 42.19 kg/m.   Physical Exam: General exam: On high flow oxygen by nasal cannula Skin: No rashes, lesions or ulcers. HEENT: Atraumatic, normocephalic, supple neck, no obvious bleeding Lungs: Clear to auscultation bilaterally no crackles or wheezing  CVS: Regular rate and rhythm, no murmur GI/Abd soft, nontender, distended from obesity, bowel sound present CNS: Alert, awake, oriented.  Able to follow commands. Baseline issues with Down syndrome. Psychiatry: Mood appropriate Extremities: No pedal edema, no calf tenderness  Data Review: I have personally reviewed the laboratory data and studies available.  Recent Labs  Lab 05/26/19 1754 05/26/19 2142 05/27/19 0057 05/27/19 0431 05/28/19 0315 05/29/19 0248 05/30/19 0316  WBC 15.1*  --   --  10.8* 8.9 8.5 9.3  NEUTROABS  12.3*  --   --   --  5.2 4.9 4.7  HGB 11.1*   < > 11.6* 10.9* 10.7* 11.7* 11.0*  HCT 36.7   < > 34.0* 35.9* 34.9* 38.6 36.8  MCV 94.8  --   --  94.5 94.8 93.5 93.9  PLT 264  --   --  275 287 306 315   < > = values in this interval not displayed.   Recent Labs  Lab 05/26/19 1754 05/26/19 1754 05/26/19 2142 05/27/19 0057 05/28/19 0315 05/29/19 0248 05/30/19 0316  NA 138   < > 137 137 139 140 139  K 4.6   < > 4.1 4.0 3.7 3.7 3.7  CL 99  --   --   --  98 97* 98  CO2 26  --   --   --  28 30 29   GLUCOSE 232*  --   --   --  178* 201* 203*  BUN 9  --   --   --  7 5* 5*  CREATININE 0.81  --   --   --  0.64 0.56 0.59  CALCIUM 8.5*  --   --   --  8.4* 8.7* 8.6*   < > = values in this interval not displayed.    Signed, Terrilee Croak, MD [facility] Pager: [phone] (Secure Chat preferred). 05/31/2019

## 2019-06-01 DIAGNOSIS — J9601 Acute respiratory failure with hypoxia: Secondary | ICD-10-CM

## 2019-06-01 LAB — CBC WITH DIFFERENTIAL/PLATELET
Abs Immature Granulocytes: 0.5 10*3/uL — ABNORMAL HIGH (ref 0.00–0.07)
Basophils Absolute: 0.2 10*3/uL — ABNORMAL HIGH (ref 0.0–0.1)
Basophils Relative: 2 %
Eosinophils Absolute: 0.3 10*3/uL (ref 0.0–0.5)
Eosinophils Relative: 4 %
HCT: 39.2 % (ref 36.0–46.0)
Hemoglobin: 12.2 g/dL (ref 12.0–15.0)
Lymphocytes Relative: 20 %
Lymphs Abs: 1.7 10*3/uL (ref 0.7–4.0)
MCH: 28.8 pg (ref 26.0–34.0)
MCHC: 31.1 g/dL (ref 30.0–36.0)
MCV: 92.5 fL (ref 80.0–100.0)
Metamyelocytes Relative: 1 %
Monocytes Absolute: 0.3 10*3/uL (ref 0.1–1.0)
Monocytes Relative: 4 %
Myelocytes: 4 %
Neutro Abs: 5.3 10*3/uL (ref 1.7–7.7)
Neutrophils Relative %: 64 %
Platelets: 341 10*3/uL (ref 150–400)
Promyelocytes Relative: 1 %
RBC: 4.24 MIL/uL (ref 3.87–5.11)
RDW: 16.4 % — ABNORMAL HIGH (ref 11.5–15.5)
WBC: 8.3 10*3/uL (ref 4.0–10.5)
nRBC: 0.2 % (ref 0.0–0.2)
nRBC: 1 /100 WBC — ABNORMAL HIGH

## 2019-06-01 LAB — BASIC METABOLIC PANEL
Anion gap: 10 (ref 5–15)
BUN: 7 mg/dL (ref 6–20)
CO2: 31 mmol/L (ref 22–32)
Calcium: 8.7 mg/dL — ABNORMAL LOW (ref 8.9–10.3)
Chloride: 97 mmol/L — ABNORMAL LOW (ref 98–111)
Creatinine, Ser: 0.7 mg/dL (ref 0.44–1.00)
GFR calc Af Amer: >60 mL/min (ref >60)
GFR calc non Af Amer: >60 mL/min (ref >60)
Glucose, Bld: 189 mg/dL — ABNORMAL HIGH (ref 70–99)
Potassium: 4.1 mmol/L (ref 3.5–5.1)
Sodium: 138 mmol/L (ref 135–145)

## 2019-06-01 LAB — CULTURE, BLOOD (ROUTINE X 2)
Culture: NO GROWTH
Culture: NO GROWTH
Special Requests: ADEQUATE
Special Requests: ADEQUATE

## 2019-06-01 LAB — MAGNESIUM: Magnesium: 1.8 mg/dL (ref 1.7–2.4)

## 2019-06-01 LAB — GLUCOSE, CAPILLARY
Glucose-Capillary: 204 mg/dL — ABNORMAL HIGH (ref 70–99)
Glucose-Capillary: 255 mg/dL — ABNORMAL HIGH (ref 70–99)

## 2019-06-01 LAB — PHOSPHORUS: Phosphorus: 4 mg/dL (ref 2.5–4.6)

## 2019-06-01 MED ORDER — FUROSEMIDE 10 MG/ML IJ SOLN
40.0000 mg | Freq: Once | INTRAMUSCULAR | Status: AC
  Administered 2019-06-01: 40 mg via INTRAVENOUS
  Filled 2019-06-01: qty 4

## 2019-06-01 MED ORDER — IBUPROFEN 600 MG PO TABS
600.0000 mg | ORAL_TABLET | Freq: Three times a day (TID) | ORAL | Status: DC | PRN
  Administered 2019-06-01: 600 mg via ORAL
  Filled 2019-06-01: qty 1

## 2019-06-01 MED ORDER — GUAIFENESIN 100 MG/5ML PO SOLN: 5.0000 mL | ORAL | 0 refills | Status: DC | PRN

## 2019-06-01 MED ORDER — FUROSEMIDE 20 MG PO TABS
20.0000 mg | ORAL_TABLET | Freq: Every day | ORAL | 0 refills | Status: DC
Start: 2019-06-01 — End: 2020-01-05

## 2019-06-01 NOTE — Progress Notes (Signed)
SATURATION QUALIFICATIONS:   Patient Saturations on Room Air at Rest =97%  Patient Saturations on Room Air while Ambulating = 90-92%  Patient Saturations on 92-98%  Pt ambulated 450 ft. without difficulty.  Jerald Kief, RN

## 2019-06-01 NOTE — Discharge Summary (Signed)
Physician Discharge Summary  Wanda Oneill VQM:[id number] DOB: Feb 18, 1980 DOA: 05/26/2019  PCP: Darreld Mclean, MD  Admit date: 05/26/2019 Discharge date: 06/01/2019  Admitted From: Home Discharge disposition: Home with home O2   Code Status: Full Code  Diet Recommendation: Cardiac/diabetic diet   Recommendations for Outpatient Follow-Up:   1. Follow-up with PCP as an outpatient 2. Follow-up with endocrinology as an outpatient 3. Sleep apnea study as an outpatient.  Discharge Diagnosis:   Principal Problem:   Acute respiratory failure with hypoxia (HCC) Active Problems:   Type 2 diabetes mellitus, uncontrolled (HCC)   Hypothyroid   Gout   Hyperglycemia   Multifocal pneumonia  History of Present Illness / Brief narrative:  Wanda R Hopperis a 39 y.o.femalewith PMH significant forDown syndrome, non-insulin-dependent type 2 diabetes, GERD, gastritis, hepatic steatosis, gout, hypothyroidism, IBS, morbid obesity (BMI 44.24). Patient presented to the ED for shortness of breath, hypoxia. 4/15, patient underwent elective bone spur shaving of the right shoulder as an outpatient under interscalene block.  Since then, patient started having drop in oxygen saturation and was brought to the ED post procedure.  Chest x-ray is showed elevated right hemidiaphragm.  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  She required high flow oxygen therapy and was subsequently discharged home on 2 L oxygen by nasal cannula on 4/18. She is status post both doses of COVID-19 vaccine, second dose 3 weeks ago.  Soon after her hospital discharge the patient was doing okay on 2 L supplemental oxygen but then again, she started becoming hypoxic with pulse ox in the 60s. Her oxygen requirement has increased over the past few days. 4/21, despite being on 6 L supplemental oxygen, patient's oxygen saturation continued to drop to 70s.   She started being more short of breath and hence was brought to the ED.  In the ED, patient required 6 L oxygen by nasal cannula.  She was tachypneic.  Not tachycardic. WBC count elevated to 15.1. Blood glucose 232.   Covid antigen negative. Chest x-ray showing diffuse bilateral and lower lung field predominant streaky densities which may represent atelectasis or atypical infection. Patient received albuterol and IV Lasix 20 mg. Patient was admitted under hospitalist service for further evaluation management. See below for details.  Hospital Course:  Acute hypoxic respiratory failure Multifactorial : Combination of aspiration pneumonia, Acute exacerbation of diastolic CHF and possible underlying sleep apnea -Not on supplemental oxygen at home until a week prior to condition. -Patient had recent hospitalization for acute hypoxic respiratory failure secondary to phrenic nerve paralysis.  Over several days, phrenic nerve paralysis improved but patient's oxygen requirement continued to worsen and hence presented to the ED on 4/21. -Chest x-ray on admission showed diffuse bilateral and lower lung field predominant streaky densities which may represent atelectasis or atypical infection. -CT angio chest was obtained 4/23 did not show any pulmonary embolism but continued to show diffuse bilateral infiltrates. -Patient remained on high flow oxygen by nasal cannula for 2 to 3 days.  She was also tried on IV Lasix which I believe made the difference.  With 2 to 3 doses of Lasix IV, patient's oxygen requirement has improved significantly, she is currently on 3 L by nasal cannula at rest.  -She completed the 7-day course of Unasyn today.  -Ambulate with oxygen to see patient qualifies for home oxygen. -Patient will benefit from low-dose Lasix daily at home.  Will start at 20 mg  daily. -Continue as needed Mucinex for cough. -Patient was started CPAP in the night for suspected coexisting sleep apnea.   However patient is not able to tolerate CPAP.  -Patient will benefit from sleep study as an outpatient.  Uncontrolled noninsulin-dependent type 2 diabetes:A1c 8.2 on 4/21 -Home meds include Metformin, Ozempic, Jardiance.  -In the hospital, oral meds were held and patient was maintained on insulin.   -Resume home meds at discharge. -Continue to follow-up with endocrine as an outpatient  Gout: Continue allopurinol  Hypothyroidism: Continue Synthroid.  Stable for discharge home today.  Subjective:  Seen and examined this morning.  Pleasant young Caucasian female.  Sitting up in chair.  Not in distress.  Feels better.  Wants to go home.  Discharge Exam:   Vitals:   06/01/19 0310 06/01/19 0413 06/01/19 0801 06/01/19 1059  BP:  113/65 127/71 (!) 121/54  Pulse: 93 82 92 84  Resp: (!) 21 (!) '24 19 19  ' Temp:  98.2 F (36.8 C) 98.6 F (37 C) 97.8 F (36.6 C)  TempSrc:  Oral Oral Oral  SpO2: 91% 94% 99% 96%  Weight: 109.6 kg       Body mass index is 41.47 kg/m.  General exam: Appears calm and comfortable.  Skin: No rashes, lesions or ulcers. HEENT: Atraumatic, normocephalic, supple neck, no obvious bleeding Lungs: Clear to auscultation bilaterally CVS: Regular rate and rhythm, no murmur GI/Abd soft, nontender, nondistended, bowel sound present CNS: Alert, awake, oriented x3  Psychiatry: Mood appropriate Extremities: No pedal edema, no calf tenderness  Discharge Instructions:  Wound care: None Discharge Instructions    Diet - low sodium heart healthy   Complete by: As directed    Diet Carb Modified   Complete by: As directed    Increase activity slowly   Complete by: As directed      Follow-up Information    Lauraine Rinne, NP Follow up on 06/11/2019.   Specialty: Pulmonary Disease Why: 1030 am  Contact information: [address] [place] [address] [phone]        [facility] Follow up.   Specialty: Sleep  Medicine Contact information: [place], [place] [id number] [place] [address] [phone]         Allergies as of 06/01/2019      Reactions   Vancomycin Itching   Cefuroxime Hives, Swelling   Unspecified location of swelling   Cefuroxime Axetil Hives   Sulfa Antibiotics Hives      Medication List    STOP taking these medications   clindamycin 300 MG capsule Commonly known as: CLEOCIN     TAKE these medications   acetaminophen 325 MG tablet Commonly known as: TYLENOL Take 650 mg by mouth every 6 (six) hours as needed for mild pain.   allopurinol 300 MG tablet Commonly known as: ZYLOPRIM Take 1 tablet (300 mg total) by mouth daily.   blood glucose meter kit and supplies Dispense based on patient and insurance preference. To check blood sugars daily FOR ICD-9 250.00 What changed:   how much to take  how to take this  when to take this   cetirizine 10 MG tablet Commonly known as: ZYRTEC Take 10 mg by mouth daily as needed for allergies.   cholestyramine 4 g packet Commonly known as: QUESTRAN DISSOLVE & TAKE 1 POWDER BY MOUTH TWICE DAILY What changed:   how much to take  how to take this  when to take this  additional instructions   clobetasol cream 0.05 % Commonly known as: TEMOVATE Apply 1 application topically daily as needed (for eczema).   dicyclomine 20 MG tablet Commonly known as: BENTYL Take 1 tablet (20 mg total) by mouth 4 (four) times daily -  before meals and at bedtime. What changed: when to take this   furosemide 20 MG tablet Commonly known as: Lasix Take 1 tablet (20 mg total) by mouth daily.   guaiFENesin 100 MG/5ML Soln Commonly known as: ROBITUSSIN Take 5 mLs (100 mg total) by mouth every 4 (four) hours as needed for cough or to loosen phlegm.   Jardiance 25 MG Tabs tablet Generic drug: empagliflozin Take 25 mg by mouth daily.   Jolessa 0.15-0.03 MG tablet Generic drug: levonorgestrel-ethinyl  estradiol Take 1 tablet by mouth once daily   levothyroxine 137 MCG tablet Commonly known as: SYNTHROID TAKE 1 TABLET BY MOUTH IN THE MORNING . APPOINTMENT REQUIRED FOR FUTURE REFILLS What changed: See the new instructions.   metFORMIN 1000 MG tablet Commonly known as: GLUCOPHAGE Take 1,000 mg by mouth 2 (two) times daily.   naproxen sodium 220 MG tablet Commonly known as: ALEVE Take 220 mg by mouth daily as needed (pain).   nystatin powder Commonly known as: MYCOSTATIN/NYSTOP Apply 1 application topically 3 (three) times daily. Use as needed for candida intertrigo What changed:   when to take this  reasons to take this  additional instructions   ondansetron 4 MG tablet Commonly known as: ZOFRAN Take 4 mg by mouth every 8 (eight) hours as needed for nausea/vomiting.   ONE TOUCH ULTRA TEST test strip Generic drug: glucose blood USE TO CHECK BLOOD SUGAR ONCE DAILY (ICD CODE:25.00) What changed: See the new instructions.   BLOOD GLUCOSE TEST STRIPS Strp Use to test blood sugar up to twice a day What changed:   how much to take  how to take this  when to take this   oxyCODONE 5 MG immediate release tablet Commonly known as: Oxy IR/ROXICODONE Take 5 mg by mouth every 4 (four) hours as needed for pain.   Ozempic (0.25 or 0.5 MG/DOSE) 2 MG/1.5ML Sopn Generic drug: Semaglutide(0.25 or 0.5MG/DOS) Inject 0.5 mg into the skin once a week. Saturdays            Durable Medical Equipment  (From admission, onward)         Start     Ordered   06/01/19 1132  DME Oxygen  Once    Question Answer Comment  Length of Need 6 Months   Liters per Minute 3   Frequency Continuous (stationary and portable oxygen unit needed)   Oxygen conserving device Yes   Oxygen delivery system Gas      06/01/19 1132          Time coordinating discharge: 35 minutes  The results of significant diagnostics from this hospitalization (including imaging, microbiology, ancillary and  laboratory) are listed below for reference.    Procedures and Diagnostic Studies:   DG Chest Port 1 View  Result Date: 05/26/2019 CLINICAL DATA:  39 year old female with shortness of breath. EXAM: PORTABLE CHEST 1 VIEW COMPARISON:  Chest radiograph dated 05/22/2019. FINDINGS: Diffuse bilateral and lower lung field predominant streaky densities relatively similar to prior radiograph and may represent atelectasis or atypical infection. Clinical correlation is recommended. Probable trace bilateral pleural effusions. No lobar consolidation, or pneumothorax. Top-normal cardiac silhouette. No acute osseous pathology. IMPRESSION: Diffuse bilateral and lower lung field predominant streaky densities may represent atelectasis or  atypical infection. Electronically Signed   By: Anner Crete M.D.   On: 05/26/2019 17:53   ECHOCARDIOGRAM COMPLETE  Result Date: 05/27/2019    ECHOCARDIOGRAM REPORT   Patient Name:   CIELLE AGUILA Loeper Date of Exam: 05/27/2019 Medical Rec #:  [id number]      Height:       64.0 in Accession #:    [id number]     Weight:       257.7 lb Date of Birth:  07-15-1980     BSA:          2.179 m Patient Age:    3 years       BP:           133/88 mmHg Patient Gender: F              HR:           83 bpm. Exam Location:  Inpatient Procedure: 2D Echo, Cardiac Doppler and Color Doppler Indications:    CHF-Acute Diastolic 563.14 / H70.26  History:        Patient has no prior history of Echocardiogram examinations.                 Risk Factors:Diabetes and Non-Smoker. Down syndrome.  Sonographer:    Vickie Epley RDCS Referring Phys: [id number] [place]  1. Left ventricular ejection fraction, by estimation, is 55%. The left ventricle has normal function. The left ventricle has no regional wall motion abnormalities. Left ventricular diastolic parameters are consistent with Grade II diastolic dysfunction (pseudonormalization).  2. Right ventricular systolic function is normal. The right  ventricular size is normal. There is moderately elevated pulmonary artery systolic pressure. The estimated right ventricular systolic pressure is 02.7 mmHg.  3. The mitral valve is normal in structure. No evidence of mitral valve regurgitation. No evidence of mitral stenosis.  4. The aortic valve is tricuspid. Aortic valve regurgitation is not visualized. No aortic stenosis is present.  5. The inferior vena cava is dilated in size with <50% respiratory variability, suggesting right atrial pressure of 15 mmHg. FINDINGS  Left Ventricle: Left ventricular ejection fraction, by estimation, is 55%. The left ventricle has normal function. The left ventricle has no regional wall motion abnormalities. The left ventricular internal cavity size was normal in size. There is no left ventricular hypertrophy. Left ventricular diastolic parameters are consistent with Grade II diastolic dysfunction (pseudonormalization). Right Ventricle: The right ventricular size is normal. No increase in right ventricular wall thickness. Right ventricular systolic function is normal. There is moderately elevated pulmonary artery systolic pressure. The tricuspid regurgitant velocity is 3.08 m/s, and with an assumed right atrial pressure of 15 mmHg, the estimated right ventricular systolic pressure is 74.1 mmHg. Left Atrium: Left atrial size was normal in size. Right Atrium: Right atrial size was normal in size. Pericardium: There is no evidence of pericardial effusion. Mitral Valve: The mitral valve is normal in structure. No evidence of mitral valve regurgitation. No evidence of mitral valve stenosis. Tricuspid Valve: The tricuspid valve is normal in structure. Tricuspid valve regurgitation is trivial. Aortic Valve: The aortic valve is tricuspid. Aortic valve regurgitation is not visualized. No aortic stenosis is present. Pulmonic Valve: The pulmonic valve was normal in structure. Pulmonic valve regurgitation is not visualized. Aorta: The aortic  root is normal in size and structure. Venous: The inferior vena cava is dilated in size with less than 50% respiratory variability, suggesting right atrial pressure of 15 mmHg. IAS/Shunts: No atrial level shunt  detected by color flow Doppler.  LEFT VENTRICLE PLAX 2D LVIDd:         3.80 cm     Diastology LVIDs:         2.90 cm     LV e' lateral:   8.49 cm/s LV PW:         0.80 cm     LV E/e' lateral: 12.6 LV IVS:        0.80 cm     LV e' medial:    6.53 cm/s LVOT diam:     2.00 cm     LV E/e' medial:  16.4 LV SV:         49 LV SV Index:   23 LVOT Area:     3.14 cm  LV Volumes (MOD) LV vol d, MOD A2C: 87.6 ml LV vol d, MOD A4C: 88.6 ml LV vol s, MOD A2C: 40.1 ml LV vol s, MOD A4C: 45.1 ml LV SV MOD A2C:     47.5 ml LV SV MOD A4C:     88.6 ml LV SV MOD BP:      45.2 ml RIGHT VENTRICLE RV S prime:     12.30 cm/s TAPSE (M-mode): 2.4 cm LEFT ATRIUM             Index      RIGHT ATRIUM          Index LA diam:        3.30 cm 1.51 cm/m RA Area:     9.07 cm LA Vol (A2C):   16.9 ml 7.76 ml/m RA Volume:   18.00 ml 8.26 ml/m LA Vol (A4C):   20.5 ml 9.41 ml/m LA Biplane Vol: 19.0 ml 8.72 ml/m  AORTIC VALVE LVOT Vmax:   109.00 cm/s LVOT Vmean:  62.400 cm/s LVOT VTI:    0.157 m  AORTA Ao Root diam: 2.80 cm MITRAL VALVE                TRICUSPID VALVE MV Area (PHT): 2.83 cm     TR Peak grad:   37.9 mmHg MV Decel Time: 268 msec     TR Vmax:        308.00 cm/s MV E velocity: 107.00 cm/s MV A velocity: 107.00 cm/s  SHUNTS MV E/A ratio:  1.00         Systemic VTI:  0.16 m                             Systemic Diam: 2.00 cm Loralie Champagne MD Electronically signed by Loralie Champagne MD Signature Date/Time: 05/27/2019/5:39:57 PM    Final      Labs:   Basic Metabolic Panel: Recent Labs  Lab 05/26/19 1754 05/26/19 2142 05/27/19 0057 05/27/19 0057 05/28/19 0315 05/28/19 0315 05/29/19 0248 05/29/19 0248 05/30/19 0316 06/01/19 0821  NA 138   < > 137  --  139  --  140  --  139 138  K 4.6   < > 4.0   < > 3.7   < > 3.7   < >  3.7 4.1  CL 99  --   --   --  98  --  97*  --  98 97*  CO2 26  --   --   --  28  --  30  --  29 31  GLUCOSE 232*  --   --   --  178*  --  201*  --  203* 189*  BUN 9  --   --   --  7  --  5*  --  5* 7  CREATININE 0.81  --   --   --  0.64  --  0.56  --  0.59 0.70  CALCIUM 8.5*  --   --   --  8.4*  --  8.7*  --  8.6* 8.7*  MG  --   --   --   --   --   --   --   --   --  1.8  PHOS  --   --   --   --   --   --   --   --   --  4.0   < > = values in this interval not displayed.   GFR Estimated Creatinine Clearance: 114.3 mL/min (by C-G formula based on SCr of 0.7 mg/dL). Liver Function Tests: Recent Labs  Lab 05/26/19 1754  AST 38  ALT 35  ALKPHOS 54  BILITOT 0.5  PROT 6.7  ALBUMIN 2.5*   No results for input(s): LIPASE, AMYLASE in the last 168 hours. No results for input(s): AMMONIA in the last 168 hours. Coagulation profile No results for input(s): INR, PROTIME in the last 168 hours.  CBC: Recent Labs  Lab 05/26/19 1754 05/26/19 2142 05/27/19 0431 05/28/19 0315 05/29/19 0248 05/30/19 0316 06/01/19 0821  WBC 15.1*   < > 10.8* 8.9 8.5 9.3 8.3  NEUTROABS 12.3*  --   --  5.2 4.9 4.7 5.3  HGB 11.1*   < > 10.9* 10.7* 11.7* 11.0* 12.2  HCT 36.7   < > 35.9* 34.9* 38.6 36.8 39.2  MCV 94.8   < > 94.5 94.8 93.5 93.9 92.5  PLT 264   < > 275 287 306 315 341   < > = values in this interval not displayed.   Cardiac Enzymes: No results for input(s): CKTOTAL, CKMB, CKMBINDEX, TROPONINI in the last 168 hours. BNP: Invalid input(s): POCBNP CBG: Recent Labs  Lab 05/31/19 1204 05/31/19 1654 05/31/19 2129 06/01/19 0618 06/01/19 1058  GLUCAP 241* 250* 283* 204* 255*   D-Dimer No results for input(s): DDIMER in the last 72 hours. Hgb A1c No results for input(s): HGBA1C in the last 72 hours. Lipid Profile No results for input(s): CHOL, HDL, LDLCALC, TRIG, CHOLHDL, LDLDIRECT in the last 72 hours. Thyroid function studies No results for input(s): TSH, T4TOTAL, T3FREE, THYROIDAB in  the last 72 hours.  Invalid input(s): FREET3 Anemia work up No results for input(s): VITAMINB12, FOLATE, FERRITIN, TIBC, IRON, RETICCTPCT in the last 72 hours. Microbiology Recent Results (from the past 240 hour(s))  Respiratory Panel by RT PCR (Flu A&B, Covid) - Nasopharyngeal Swab     Status: None   Collection Time: 05/26/19  8:55 PM   Specimen: Nasopharyngeal Swab  Result Value Ref Range Status   SARS Coronavirus 2 by RT PCR NEGATIVE NEGATIVE Final    Comment: (NOTE) SARS-CoV-2 target nucleic acids are NOT DETECTED. The SARS-CoV-2 RNA is generally detectable in upper respiratoy specimens during the acute phase of infection. The lowest concentration of SARS-CoV-2 viral copies this assay can detect is 131 copies/mL. A negative result does not preclude SARS-Cov-2 infection and should not be used as the sole basis for treatment or other patient management decisions. A negative result may occur with  improper specimen collection/handling, submission of specimen other than nasopharyngeal swab, presence of viral mutation(s) within the areas targeted by this  assay, and inadequate number of viral copies (<131 copies/mL). A negative result must be combined with clinical observations, patient history, and epidemiological information. The expected result is Negative. Fact Sheet for Patients:  PinkCheek.be Fact Sheet for Healthcare Providers:  GravelBags.it This test is not yet ap proved or cleared by the [place] FDA and  has been authorized for detection and/or diagnosis of SARS-CoV-2 by FDA under an Emergency Use Authorization (EUA). This EUA will remain  in effect (meaning this test can be used) for the duration of the COVID-19 declaration under Section 564(b)(1) of the Act, 21 U.S.C. section 360bbb-3(b)(1), unless the authorization is terminated or revoked sooner.    Influenza A by PCR NEGATIVE NEGATIVE Final   Influenza  B by PCR NEGATIVE NEGATIVE Final    Comment: (NOTE) The Xpert Xpress SARS-CoV-2/FLU/RSV assay is intended as an aid in  the diagnosis of influenza from Nasopharyngeal swab specimens and  should not be used as a sole basis for treatment. Nasal washings and  aspirates are unacceptable for Xpert Xpress SARS-CoV-2/FLU/RSV  testing. Fact Sheet for Patients: PinkCheek.be Fact Sheet for Healthcare Providers: GravelBags.it This test is not yet approved or cleared by the [place] FDA and  has been authorized for detection and/or diagnosis of SARS-CoV-2 by  FDA under an Emergency Use Authorization (EUA). This EUA will remain  in effect (meaning this test can be used) for the duration of the  Covid-19 declaration under Section 564(b)(1) of the Act, 21  U.S.C. section 360bbb-3(b)(1), unless the authorization is  terminated or revoked. Performed at [facility], [place] [address] [place], [place] [address]   Culture, blood (routine x 2)     Status: None   Collection Time: 05/27/19  4:38 AM   Specimen: BLOOD RIGHT WRIST  Result Value Ref Range Status   Specimen Description BLOOD RIGHT WRIST  Final   Special Requests   Final    BOTTLES DRAWN AEROBIC AND ANAEROBIC Blood Culture adequate volume   Culture   Final    NO GROWTH 5 DAYS Performed at [facility], [place] [address], [place], [place] [address]    Report Status 06/01/2019 FINAL  Final  Culture, blood (routine x 2)     Status: None   Collection Time: 05/27/19  4:45 AM   Specimen: BLOOD LEFT HAND  Result Value Ref Range Status   Specimen Description BLOOD LEFT HAND  Final   Special Requests   Final    BOTTLES DRAWN AEROBIC AND ANAEROBIC Blood Culture adequate volume   Culture   Final    NO GROWTH 5 DAYS Performed at [facility], [place] [address] [place], [place] [address]    Report Status 06/01/2019 FINAL  Final  Respiratory Panel by PCR     Status: None    Collection Time: 05/28/19 12:00 AM   Specimen: Nasopharyngeal Swab; Respiratory  Result Value Ref Range Status   Adenovirus NOT DETECTED NOT DETECTED Final   Coronavirus 229E NOT DETECTED NOT DETECTED Final    Comment: (NOTE) The Coronavirus on the Respiratory Panel, DOES NOT test for the novel  Coronavirus (2019 nCoV)    Coronavirus HKU1 NOT DETECTED NOT DETECTED Final   Coronavirus NL63 NOT DETECTED NOT DETECTED Final   Coronavirus OC43 NOT DETECTED NOT DETECTED Final   Metapneumovirus NOT DETECTED NOT DETECTED Final   Rhinovirus / Enterovirus NOT DETECTED NOT DETECTED Final   Influenza A NOT DETECTED NOT DETECTED Final   Influenza B NOT DETECTED  NOT DETECTED Final   Parainfluenza Virus 1 NOT DETECTED NOT DETECTED Final   Parainfluenza Virus 2 NOT DETECTED NOT DETECTED Final   Parainfluenza Virus 3 NOT DETECTED NOT DETECTED Final   Parainfluenza Virus 4 NOT DETECTED NOT DETECTED Final   Respiratory Syncytial Virus NOT DETECTED NOT DETECTED Final   Bordetella pertussis NOT DETECTED NOT DETECTED Final   Chlamydophila pneumoniae NOT DETECTED NOT DETECTED Final   Mycoplasma pneumoniae NOT DETECTED NOT DETECTED Final    Comment: Performed at [facility], [place] [address], [place], [place] [address]    Please note: You were cared for by a hospitalist during your hospital stay. Once you are discharged, your primary care physician will handle any further medical issues. Please note that NO REFILLS for any discharge medications will be authorized once you are discharged, as it is imperative that you return to your primary care physician (or establish a relationship with a primary care physician if you do not have one) for your post hospital discharge needs so that they can reassess your need for medications and monitor your lab values.  Signed: Marlowe Aschoff Pegge Cumberledge  [facility] 06/01/2019, 11:32 AM

## 2019-06-01 NOTE — Progress Notes (Signed)
PT and family provided discharge instructions and education. Pt iv removed and intact. Tele removed/ccmd notified. Pt vitals stable. Pt denies complaints. All belongings gathered by family. Volunteers called to tx via wheelchair to [employer].  Jerald Kief, RN

## 2019-06-01 NOTE — Progress Notes (Signed)
Noted home 02 orders placed, however on ambulatory 02 check for qualifying purposes- pt did not meet guidelines under Medicare to cover home 02. No home 02 arrange.

## 2019-06-01 NOTE — Progress Notes (Signed)
NAME:  Wanda Oneill, MRN:  [id number], DOB:  September 18, 1980, LOS: 6 ADMISSION DATE:  05/26/2019, CONSULTATION DATE:  4/23 REFERRING MD:  Rance Muir, CHIEF COMPLAINT:  Hypoxia    Brief History   39 year old female with history of Down syndrome and as well as subacute elevated right hemidiaphragm following shoulder surgery felt possibly due to phrenic nerve injury.  Admitted acutely with acute on chronic hypoxic respiratory failure Portable chest x-ray obtained shows diffuse patchy bilateral airspace disease right predominantly more than left.  Placed on supplemental oxygen, empiric antibiotics, and administered IV diuresis.  Noted to have worsening nocturnal hypoxia and because of this pulmonary asked to evaluate.    Past  Medical History   Downs syndrome, NIDDD. GERD, Gastritis, Gout, MO (wt 110.kg w/ BMI 41.74), Recent bone spur shaving right shoulder (4/15) w/ interscalene block w/ sub-acute elevated right HD felt either 2/2 phrenic nerve injury vs RML collapse.  Significant Hospital Events   4/21 admitted, placed on empiric antibiotics 4/22 noted to have nocturnal hypoxia with pulse oximetry in the 80s on 7 L when sleeping, rapidly improved when awake 4/23 ambulating in hallway 300 feet on 6 L, pulse oximetry 85-94%  Consults:  pulm consult   Procedures:    Significant Diagnostic Tests:  Clinical/Bedside Swallow Evaluation>> Mild aspiration risk 4/24 Oral mechanism exam was unremarkable.  She consumed trials of thin liquid and preferred solids (grapes).  Mastication was timely and suspect timely swallow initiation.  Pt was noted to hold liquid in her oral cavity briefly prior to swallow initiation, but this was functional.  Delayed throat clearing was observed intermittently following PO trials.  Suspect that it was related to baseline cough, but unable to r/o laryngeal penetration or aspiration.  Pt reported that some of the meats on the regular diet were difficult to masticate, therefore  recommend diet change to Dysphagia 3 (soft) solid with continuation of thin liquid.  Pt will benefit from the following compensatory strategies: 1) Small bites/sips 2) Slow rate of intake 3) Take a break if SOB 4) Sit upright as possible.  SLP will f/u to monitor diet tolerance.    Pt was unhappy with the limitations of the Dysphagia 3 diet and that she wished to resume a regular solid diet.  Diet was changed back to regular solids (carb modified) with note in diet order to please pre-cut meats per pt request in order to make self-feeding easier.   CT angiogram 4/23:  Negative for pulmonary emboli, did demonstrate diffuse bilateral pulmonary infiltrates consistent with multifocal pneumonia. Echocardiogram 4/22: LVEF 55% left ventricular diastolic parameters grade 2 diastolic dysfunction RV function normal moderately elevated PA systolic pressure estimated at 52.9 mmHg right atrial pressure 50 mmHg  Micro Data:  Blood culture x2 4/22 >> ng Respiratory panel by PCR negative Coronavirus, influenza a and B by PCR all negative  Antimicrobials:  Unasyn 4/21 azithromycin 4/21 Interim history/subjective:  Cough improved  Oxygen requirements better   Objective   Blood pressure 127/71, pulse 92, temperature 98.6 F (37 C), temperature source Oral, resp. rate 19, weight 109.6 kg, SpO2 99 %.        Intake/Output Summary (Last 24 hours) at 06/01/2019 1003 Last data filed at 06/01/2019 0950 Gross per 24 hour  Intake 440 ml  Output 2400 ml  Net -1960 ml   Filed Weights   05/30/19 0514 05/31/19 0336 06/01/19 0310  Weight: 109.8 kg 111.5 kg 109.6 kg    Examination: General this is a pleasant 38 yof  resting in chair HENT NCAT no JVD pulm decreased bases. 3 liters . No accessory use  Card RRR abd not tender  Ext trace LE edema Neuro baseline   Resolved Hospital Problem list   Right diaphragm paralysis post interscalene block -resolved  Assessment & Plan:   Acute hypoxic respiratory  failure  Acute diastolic heart failure  Pulmonary edema  Multifocal PNA (presumed aspiration) Dysphagia GERD Nocturnal hypoxia    Problem list:  Acute hypoxic respiratory failure in the setting of diffuse bilateral pulmonary infiltrates: favor combination of pulmonary edema from acute diastolic HF (perhaps from untreated sleep apnea) & aspiration PNA -now down to 3 liters -esophagram w/out evidence of dysmotility but does have small sliding hiatal hernia so I suspect still at risk for reflux and aspiration   Plan May 7 at 1030 w/ Wyn Quaker NP (follow up) Walking oximetry prior to dc OK to stop abx today  Home on PPI w/ reflux precautions Consider lasix dosing at DC  Nocturnal hypoxia Suspect sleep apnea, BMI 41 She was tested 16 years ago, and this was negative Plan Has f/u In our clinic  Needs PSG   Erick Colace ACNP-BC [place] Pager # [phone] OR # [phone] if no answer

## 2019-06-02 DIAGNOSIS — Z4789 Encounter for other orthopedic aftercare: Secondary | ICD-10-CM | POA: Diagnosis not present

## 2019-06-07 ENCOUNTER — Inpatient Hospital Stay: Payer: Medicare HMO | Admitting: Pulmonary Disease

## 2019-06-11 ENCOUNTER — Other Ambulatory Visit: Payer: Self-pay

## 2019-06-11 ENCOUNTER — Encounter: Payer: Self-pay | Admitting: Pulmonary Disease

## 2019-06-11 ENCOUNTER — Ambulatory Visit (INDEPENDENT_AMBULATORY_CARE_PROVIDER_SITE_OTHER): Payer: Medicare HMO | Admitting: Pulmonary Disease

## 2019-06-11 VITALS — BP 118/82 | HR 84 | Temp 97.7°F | Ht 64.0 in | Wt 242.0 lb

## 2019-06-11 DIAGNOSIS — R05 Cough: Secondary | ICD-10-CM

## 2019-06-11 DIAGNOSIS — J9601 Acute respiratory failure with hypoxia: Secondary | ICD-10-CM | POA: Diagnosis not present

## 2019-06-11 DIAGNOSIS — J189 Pneumonia, unspecified organism: Secondary | ICD-10-CM | POA: Diagnosis not present

## 2019-06-11 DIAGNOSIS — Z9189 Other specified personal risk factors, not elsewhere classified: Secondary | ICD-10-CM | POA: Insufficient documentation

## 2019-06-11 DIAGNOSIS — R059 Cough, unspecified: Secondary | ICD-10-CM | POA: Insufficient documentation

## 2019-06-11 DIAGNOSIS — K219 Gastro-esophageal reflux disease without esophagitis: Secondary | ICD-10-CM

## 2019-06-11 MED ORDER — BENZONATATE 200 MG PO CAPS: 200.0000 mg | ORAL_CAPSULE | Freq: Three times a day (TID) | ORAL | 3 refills | Status: DC | PRN

## 2019-06-11 NOTE — Assessment & Plan Note (Signed)
High suspicion of obstructive sleep apnea and/or obesity hypoventilation syndrome Last in lab study was 14 years ago was negative for obstructive sleep apnea per patient and mother  Discussion: Offered in lab sleep study.  Patient would like to think about this.  Patient has preferences that the sleep tech that does the in lab study be a female or that her mother can stay with her.  We will work on trying to coordinate this.  Patient, mother as well as father will discuss completing the in lab sleep study moving forward and they will contact with our office if they decide to move forward with this.  There are also concerns about whether or not the patient will be able to manage or be compliant with CPAP therapy.  Oral appliance is an option.  Father has obstructive sleep apnea managed on CPAP.  Mother has obstructive sleep apnea with an oral appliance.  Knee  Good family support  Plan: Patient will let us know if and when she decides to proceed forward with an in lab sleep study

## 2019-06-11 NOTE — Assessment & Plan Note (Signed)
Plan: Continue follow-up with primary care Continue to clinically watch for symptoms of acid reflux

## 2019-06-11 NOTE — Assessment & Plan Note (Signed)
Improving cough Status post antibiotic treatment for multifocal pneumonia Responsive to Wanda Oneill, requesting refill  Plan: Wanda Oneill called in today

## 2019-06-11 NOTE — Patient Instructions (Addendum)
You were seen today by Lauraine Rinne, NP  for:   1. At risk for obstructive sleep apnea /obesity hypoventilation syndrome  We would recommend a split night in lab sleep study  Please discuss this and if you are agreeable we can get this scheduled for you  I will work to investigate whether or not I can secure a female tech or whether or not your mother can stay with you  I believe it is reasonable to start using your Lasix 20 mg daily as needed for lower extremity swelling or weight gain of greater than 3 pounds in 24 hours  If you have questions or concerns about whether or not you should take your Lasix please contact our office  2. Acute respiratory failure with hypoxia (HCC)  Glad that this is improved  Continue to keep an eye on your oxygen levels at home make sure oxygen levels are maintaining above 90%  3. Gastroesophageal reflux disease, unspecified whether esophagitis present  Continue reflux medications  4. Cough  - benzonatate (TESSALON) 200 MG capsule; Take 1 capsule (200 mg total) by mouth 3 (three) times daily as needed for cough.  Dispense: 90 capsule; Refill: 3  5. Multifocal pneumonia  Glad you are clinically improving  At next follow-up we should consider a x-ray   We recommend today:   Meds ordered this encounter  Medications  . benzonatate (TESSALON) 200 MG capsule    Sig: Take 1 capsule (200 mg total) by mouth 3 (three) times daily as needed for cough.    Dispense:  90 capsule    Refill:  3    Follow Up:    Return in about 4 weeks (around 07/09/2019), or if symptoms worsen or fail to improve, for Follow up with Dr. Melvyn Novas.   Please do your part to reduce the spread of COVID-19:      Reduce your risk of any infection  and COVID19 by using the similar precautions used for avoiding the common cold or flu:  Marland Kitchen Wash your hands often with soap and warm water for at least 20 seconds.  If soap and water are not readily available, use an alcohol-based  hand sanitizer with at least 60% alcohol.  . If coughing or sneezing, cover your mouth and nose by coughing or sneezing into the elbow areas of your shirt or coat, into a tissue or into your sleeve (not your hands). Langley Gauss A MASK when in public  . Avoid shaking hands with others and consider head nods or verbal greetings only. . Avoid touching your eyes, nose, or mouth with unwashed hands.  . Avoid close contact with people who are sick. . Avoid places or events with large numbers of people in one location, like concerts or sporting events. . If you have some symptoms but not all symptoms, continue to monitor at home and seek medical attention if your symptoms worsen. . If you are having a medical emergency, call 911.   [place] / e-Visit: [email or web address]         [facility]: [facility]: [id number]                   [facility]: [id number]     It is flu season:   >>> Best ways to protect herself from the flu: Receive the yearly flu vaccine, practice good hand hygiene washing with soap and also using hand  sanitizer when available, eat a nutritious meals, get adequate rest, hydrate appropriately   Please contact the office if your symptoms worsen or you have concerns that you are not improving.   Thank you for choosing [facility] for your healthcare, and for allowing [place] to partner with you on your healthcare journey. I am thankful to be able to provide care to you today.   Wyn Quaker FNP-C

## 2019-06-11 NOTE — Assessment & Plan Note (Addendum)
High suspicion of obstructive sleep apnea or or obesity hypoventilation syndrome or both  Currently recovered from multifocal pneumonia as well as acute respiratory failure  Plan: Continue to clinically monitor We will transition furosemide to 20 mg daily as needed for lower extremity swelling or weight gain

## 2019-06-11 NOTE — Assessment & Plan Note (Signed)
Recent multifocal pneumonia High risk for aspiration Dysphagia 3 diet recommended well in the hospital Patient currently eating normal diet without restrictions Patient trying to eat slow and sit upright Currently no signs of worsened aspiration or pneumonia No fevers  Plan: We will continue to clinically monitor Consider repeat chest x-ray at next office visit in 6 to 8 weeks

## 2019-06-11 NOTE — Progress Notes (Signed)
'@Patient'  ID: Wanda Oneill, female    DOB: 02-03-1981, 39 y.o.   MRN: [id number]  Chief Complaint  Patient presents with  . Hospitalization Follow-up    Has been in the hospital twice since her surgery last month. Has a cough in the mornings from sinus drainage. Has not used any oxygen in over a week.     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  PMH: Downs syndrome, NIDDD. GERD, Gastritis, Gout, MO (wt 110.kg w/ BMI 41.74) Smoker/ Smoking History: Never Smoker  Maintenance: None Pt of: Dr. Melvyn Novas   06/11/2019  - Visit   39 year old female never smoker initially consulted with our practice in April/15/2021.  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  Patient was later discharged on 05/23/2019.  Patient was O2 dependent and discharged with 4 L patient was requested to follow-up with Dr. Melvyn Novas Dr. Lamonte Sakai in 2 weeks with a chest x-ray.  Unfortunately since discharge patient was not satting well on 05/26/2019 she was instructed to present to the emergency room by Dr. Melvyn Novas for likely BiPAP.  Patient was then consulted again with our practice on 05/28/2019.  Patient was treated for acute hypoxic respiratory failure and bilateral infiltrates seem to be related to pneumonia most likely aspiration.  There is a concern for obstructive sleep apnea.  Patient was discharged on 06/01/2019.  During second hospitalization patient was recommended to change to a dysphagia 3 soft solid diet.  Patient was frustrated and unhappy with the  limitations of dysphagia 3 she was to resume a regular solid diet.  Patient was changed back to regular solids to make sure that they precut meats per patient request in order to make self eating easier.  Patient was encouraged to remain on PPI with reflux precautions.  Requested to have sleep study in the outpatient setting.  Patient presenting today with her mother.  They report the patient was tested 14 years ago with an in lab sleep study not found to have obstructive sleep apnea.  They are willing to consider outpatient sleep study.  They would like to discuss this is a family first  Patient reports that she feels that she is back to baseline since being discharged from the hospital.  She is not having any fevers, increased cough or congestion.  Cough is well managed with Tessalon Perles as well as occasional over-the-counter Robitussin.  They would like a refill of the [employer]  We will coordinate this today.  Patient was discharged on 20 mg of Lasix.  Weights have been stable.  Patient did not pick up the Lasix for the first 7 days since discharge.  There have been concerns because the 20 mg of Lasix have caused the patient to have to the bathroom quite often when she is at school which is on Tuesdays, Wednesdays and Thursdays.  We will discuss this today.  Patient weighs herself every day.  Appears euvolemic today.  Tests:   05/28/2019-CTA chest-no evidence of PE, bilateral airspace lung opacities consistent with multifocal pneumonia  05/31/2019-DG esophagus-small sliding hiatal hernia without demonstratable gastroesophageal reflux, no  esophageal mass or stricture  05/27/2019-echocardiogram-LV ejection fraction 46%, grade 2 diastolic dysfunction, right ventricular systolic function is normal, moderately elevated pulmonary artery systolic [id number]  FENO:  No results found for: NITRICOXIDE  PFT: No flowsheet data found.  WALK:  No flowsheet data found.  Imaging: CT ANGIO  CHEST PE W OR WO CONTRAST  Result Date: 05/28/2019 CLINICAL DATA:  Short of breath. EXAM: CT ANGIOGRAPHY CHEST WITH CONTRAST TECHNIQUE: Multidetector CT imaging of the chest was performed using the standard protocol during bolus administration of intravenous contrast. Multiplanar CT image reconstructions and MIPs were obtained to evaluate the vascular anatomy. CONTRAST:  63m OMNIPAQUE IOHEXOL 350 MG/ML SOLN COMPARISON:  Current chest radiograph. FINDINGS: Cardiovascular: There is satisfactory opacification of the pulmonary arteries to the segmental level. No evidence of a pulmonary embolism. Heart is borderline to mildly enlarged. No pericardial effusion. No coronary artery calcifications. Great vessels are normal in caliber. Mediastinum/Nodes: Shoddy right paratracheal mediastinal and right subcarinal adenopathy, largest node, 1.6 cm in short axis. No defined mediastinal or hilar mass. Trachea and esophagus are unremarkable. Lungs/Pleura: Bilateral predominantly ground-glass type lung opacities most prominent in the right upper and bilateral lower lobes. No lung mass or discrete nodule. No pleural effusion or pneumothorax. Upper Abdomen: No acute abnormality. Musculoskeletal: No fracture or acute finding. No osteoblastic or osteolytic lesions. Review of the MIP images confirms the above findings. IMPRESSION: 1. No evidence of a pulmonary embolism. 2. Bilateral airspace lung opacities consistent with multifocal pneumonia. Electronically Signed   By: DLajean ManesM.D.   On: 05/28/2019 14:05   DG CHEST PORT 1 VIEW  Result Date: 05/30/2019 CLINICAL DATA:  Acute respiratory failure EXAM: PORTABLE CHEST 1 VIEW COMPARISON:  May 29, 2019 FINDINGS: Stable cardiomegaly. The hila and mediastinum are are unchanged. Bilateral pulmonary opacities remain but are improved in the interval, particularly on the right. IMPRESSION: Improving patchy bilateral pulmonary opacities. The improvement is most prominent on the right.  Electronically Signed   By: DDorise BullionIII M.D   On: 05/30/2019 11:53   DG Chest Port 1 View  Result Date: 05/29/2019 CLINICAL DATA:  Shortness of breath since shoulder surgery May 20, 2019. EXAM: PORTABLE CHEST 1 VIEW COMPARISON:  May 28, 2019 FINDINGS: No pneumothorax. The cardiomediastinal silhouette is stable. Bilateral pulmonary opacities, particularly in the bases, are stable. IMPRESSION: 1. Bilateral pulmonary infiltrates, particularly in the bases, most worrisome for pneumonia or aspiration, are stable. No interval changes otherwise seen. Electronically Signed   By: DDorise BullionIII M.D   On: 05/29/2019 11:55   DG Chest Port 1 View  Result Date: 05/28/2019 CLINICAL DATA:  Hypoxia, shortness of breath EXAM: PORTABLE CHEST 1 VIEW COMPARISON:  05/26/2019 FINDINGS: The heart size and mediastinal contours are within normal limits. Slight interval increase in mild, diffuse bilateral interstitial pulmonary opacity. The visualized skeletal structures are unremarkable. IMPRESSION: Slight interval increase in mild, diffuse bilateral interstitial pulmonary opacity, consistent with worsened infection or edema. No focal airspace opacity. Electronically Signed   By: AEddie CandleM.D.   On: 05/28/2019 10:15   DG Chest Port 1 View  Result Date: 05/26/2019 CLINICAL DATA:  39year old female with shortness of breath. EXAM: PORTABLE CHEST 1 VIEW COMPARISON:  Chest radiograph dated 05/22/2019. FINDINGS: Diffuse bilateral and lower lung field predominant streaky densities relatively similar to prior radiograph and may represent atelectasis or atypical infection. Clinical correlation is recommended. Probable trace bilateral pleural effusions. No lobar consolidation, or pneumothorax. Top-normal cardiac silhouette. No acute osseous pathology. IMPRESSION: Diffuse  bilateral and lower lung field predominant streaky densities may represent atelectasis or atypical infection. Electronically Signed   By: Anner Crete M.D.   On: 05/26/2019 17:53   DG CHEST PORT 1 VIEW  Result Date: 05/22/2019 CLINICAL DATA:  Acute respiratory failure. EXAM: PORTABLE CHEST 1 VIEW COMPARISON:  May 21, 2019. FINDINGS: Stable cardiomegaly. No pneumothorax is noted. Increased bibasilar atelectasis or infiltrates are noted with associated pleural effusions. Bony thorax is unremarkable. IMPRESSION: Increased bibasilar atelectasis or infiltrates are noted with associated pleural effusions. Electronically Signed   By: Marijo Conception M.D.   On: 05/22/2019 10:18   DG Chest Port 1 View  Result Date: 05/21/2019 CLINICAL DATA:  Hypoxia EXAM: PORTABLE CHEST 1 VIEW COMPARISON:  May 20, 2019. FINDINGS: There is stable relatively mild elevation of the right hemidiaphragm. There is atelectatic change in both lower lung regions. The lungs elsewhere are clear. Heart is mildly enlarged with pulmonary vascularity normal. No adenopathy. No bone lesions. IMPRESSION: Bilateral lower lung atelectatic change. No frank consolidation. Stable cardiac prominence. Stable elevation right hemidiaphragm. Electronically Signed   By: Lowella Grip III M.D.   On: 05/21/2019 07:57   DG Chest Port 1 View  Result Date: 05/20/2019 CLINICAL DATA:  Shortness of breath EXAM: PORTABLE CHEST 1 VIEW COMPARISON:  02/10/2016 FINDINGS: Markedly low lung volumes. Elevation of the right diaphragm. Enlarged cardiomediastinal silhouette with vascular congestion and perihilar interstitial opacities, possible edema. There may be a small right-sided pleural effusion. IMPRESSION: 1. Marked hypoventilatory change with elevated right diaphragm. 2. Enlarged cardiomediastinal silhouette compared to prior, likely augmented by low lung volume and portable technique. There does appear to be vascular congestion and mild perihilar edema. Suspect that there may be a small right-sided pleural effusion. Electronically Signed   By: Donavan Foil M.D.   On: 05/20/2019 19:04    ECHOCARDIOGRAM COMPLETE  Result Date: 05/27/2019    ECHOCARDIOGRAM REPORT   Patient Name:   TORYN DEWALT Deshazer Date of Exam: 05/27/2019 Medical Rec #:  [id number]      Height:       64.0 in Accession #:    [id number]     Weight:       257.7 lb Date of Birth:  [birth year]     BSA:          2.179 m Patient Age:    37 years       BP:           133/88 mmHg Patient Gender: F              HR:           83 bpm. Exam Location:  Inpatient Procedure: 2D Echo, Cardiac Doppler and Color Doppler Indications:    CHF-Acute Diastolic 381.82 / X93.71  History:        Patient has no prior history of Echocardiogram examinations.                 Risk Factors:Diabetes and Non-Smoker. Down syndrome.  Sonographer:    Vickie Epley RDCS Referring Phys: [id number] [place]  1. Left ventricular ejection fraction, by estimation, is 55%. The left ventricle has normal function. The left ventricle has no regional wall motion abnormalities. Left ventricular diastolic parameters are consistent with Grade II diastolic dysfunction (pseudonormalization).  2. Right ventricular systolic function is normal. The right ventricular size is normal. There is moderately elevated pulmonary artery systolic pressure. The estimated right ventricular systolic pressure is 81.0 mmHg.  3. The  mitral valve is normal in structure. No evidence of mitral valve regurgitation. No evidence of mitral stenosis.  4. The aortic valve is tricuspid. Aortic valve regurgitation is not visualized. No aortic stenosis is present.  5. The inferior vena cava is dilated in size with <50% respiratory variability, suggesting right atrial pressure of 15 mmHg. FINDINGS  Left Ventricle: Left ventricular ejection fraction, by estimation, is 55%. The left ventricle has normal function. The left ventricle has no regional wall motion abnormalities. The left ventricular internal cavity size was normal in size. There is no left ventricular hypertrophy. Left ventricular diastolic  parameters are consistent with Grade II diastolic dysfunction (pseudonormalization). Right Ventricle: The right ventricular size is normal. No increase in right ventricular wall thickness. Right ventricular systolic function is normal. There is moderately elevated pulmonary artery systolic pressure. The tricuspid regurgitant velocity is 3.08 m/s, and with an assumed right atrial pressure of 15 mmHg, the estimated right ventricular systolic pressure is 38.4 mmHg. Left Atrium: Left atrial size was normal in size. Right Atrium: Right atrial size was normal in size. Pericardium: There is no evidence of pericardial effusion. Mitral Valve: The mitral valve is normal in structure. No evidence of mitral valve regurgitation. No evidence of mitral valve stenosis. Tricuspid Valve: The tricuspid valve is normal in structure. Tricuspid valve regurgitation is trivial. Aortic Valve: The aortic valve is tricuspid. Aortic valve regurgitation is not visualized. No aortic stenosis is present. Pulmonic Valve: The pulmonic valve was normal in structure. Pulmonic valve regurgitation is not visualized. Aorta: The aortic root is normal in size and structure. Venous: The inferior vena cava is dilated in size with less than 50% respiratory variability, suggesting right atrial pressure of 15 mmHg. IAS/Shunts: No atrial level shunt detected by color flow Doppler.  LEFT VENTRICLE PLAX 2D LVIDd:         3.80 cm     Diastology LVIDs:         2.90 cm     LV e' lateral:   8.49 cm/s LV PW:         0.80 cm     LV E/e' lateral: 12.6 LV IVS:        0.80 cm     LV e' medial:    6.53 cm/s LVOT diam:     2.00 cm     LV E/e' medial:  16.4 LV SV:         49 LV SV Index:   23 LVOT Area:     3.14 cm  LV Volumes (MOD) LV vol d, MOD A2C: 87.6 ml LV vol d, MOD A4C: 88.6 ml LV vol s, MOD A2C: 40.1 ml LV vol s, MOD A4C: 45.1 ml LV SV MOD A2C:     47.5 ml LV SV MOD A4C:     88.6 ml LV SV MOD BP:      45.2 ml RIGHT VENTRICLE RV S prime:     12.30 cm/s TAPSE  (M-mode): 2.4 cm LEFT ATRIUM             Index      RIGHT ATRIUM          Index LA diam:        3.30 cm 1.51 cm/m RA Area:     9.07 cm LA Vol (A2C):   16.9 ml 7.76 ml/m RA Volume:   18.00 ml 8.26 ml/m LA Vol (A4C):   20.5 ml 9.41 ml/m LA Biplane Vol: 19.0 ml 8.72 ml/m  AORTIC VALVE LVOT  Vmax:   109.00 cm/s LVOT Vmean:  62.400 cm/s LVOT VTI:    0.157 m  AORTA Ao Root diam: 2.80 cm MITRAL VALVE                TRICUSPID VALVE MV Area (PHT): 2.83 cm     TR Peak grad:   37.9 mmHg MV Decel Time: 268 msec     TR Vmax:        308.00 cm/s MV E velocity: 107.00 cm/s MV A velocity: 107.00 cm/s  SHUNTS MV E/A ratio:  1.00         Systemic VTI:  0.16 m                             Systemic Diam: 2.00 cm Loralie Champagne MD Electronically signed by Loralie Champagne MD Signature Date/Time: 05/27/2019/5:39:57 PM    Final    DG ESOPHAGUS W SINGLE CM (SOL OR THIN BA)  Result Date: 05/31/2019 CLINICAL DATA:  Persistent coughing. Possible GE reflux. EXAM: ESOPHOGRAM/BARIUM SWALLOW TECHNIQUE: Single contrast examination was performed using  thin barium. FLUOROSCOPY TIME:  Fluoroscopy Time:  2 minutes and 30 seconds Radiation Exposure Index (if provided by the fluoroscopic device): 45.4 mGy Number of Acquired Spot Images: 0 COMPARISON:  None. FINDINGS: Normal esophageal motility. No intrinsic or extrinsic lesions of the esophagus were identified. There is a small sliding hiatal hernia. No distal esophageal stricture. No GE reflux was demonstrated. IMPRESSION: 1. Small sliding hiatal hernia without demonstrable GE reflux. 2. No esophageal mass or stricture. Electronically Signed   By: Marijo Sanes M.D.   On: 05/31/2019 14:04    Lab Results:  CBC    Component Value Date/Time   WBC 8.3 06/01/2019 0821   RBC 4.24 06/01/2019 0821   HGB 12.2 06/01/2019 0821   HCT 39.2 06/01/2019 0821   PLT 341 06/01/2019 0821   MCV 92.5 06/01/2019 0821   MCV 89.2 02/06/2015 1940   MCH 28.8 06/01/2019 0821   MCHC 31.1 06/01/2019 0821   RDW  16.4 (H) 06/01/2019 0821   LYMPHSABS 1.7 06/01/2019 0821   MONOABS 0.3 06/01/2019 0821   EOSABS 0.3 06/01/2019 0821   BASOSABS 0.2 (H) 06/01/2019 0821    BMET    Component Value Date/Time   NA 138 06/01/2019 0821   K 4.1 06/01/2019 0821   CL 97 (L) 06/01/2019 0821   CO2 31 06/01/2019 0821   GLUCOSE 189 (H) 06/01/2019 0821   BUN 7 06/01/2019 0821   CREATININE 0.70 06/01/2019 0821   CREATININE 0.73 12/21/2012 1851   CALCIUM 8.7 (L) 06/01/2019 0821   GFRNONAA >60 06/01/2019 0821   GFRAA >60 06/01/2019 0821    BNP    Component Value Date/Time   BNP 243.0 (H) 05/26/2019 1754    ProBNP    Component Value Date/Time   PROBNP 39.2 10/22/2007 1010    Specialty Problems      Pulmonary Problems   Acute hypoxemic respiratory failure (HCC)   Acute respiratory failure with hypoxemia (HCC)   Acute respiratory failure with hypoxia (HCC)   Multifocal pneumonia   Cough      Allergies  Allergen Reactions  . Vancomycin Itching  . Cefuroxime Hives and Swelling    Unspecified location of swelling  . Cefuroxime Axetil Hives  . Sulfa Antibiotics Hives    Immunization History  Administered Date(s) Administered  . Influenza Split 11/05/2011  . Influenza-Unspecified 12/07/2014  . Moderna SARS-COVID-2 Vaccination 04/01/2019,  05/04/2019  . Tdap 03/05/2017    Past Medical History:  Diagnosis Date  . Diabetes mellitus    type II  . Down syndrome   . Family history of adverse reaction to anesthesia    mom has had n/v  . Gastritis   . GERD (gastroesophageal reflux disease)   . Headache   . Hepatic steatosis   . Hidradenitis   . Hypothyroidism   . Irritable bowel syndrome   . Periumbilical hernia   . Pneumonia   . Restless legs   . Tendonitis    chronic in left foot  . Thyroid disease    hypothyroidism    Tobacco History: Social History   Tobacco Use  Smoking Status Never Smoker  Smokeless Tobacco Never Used   Counseling given: Yes   Continue to not  smoke  Outpatient Encounter Medications as of 06/11/2019  Medication Sig  . acetaminophen (TYLENOL) 325 MG tablet Take 650 mg by mouth every 6 (six) hours as needed for mild pain.   Marland Kitchen allopurinol (ZYLOPRIM) 300 MG tablet Take 1 tablet (300 mg total) by mouth daily.  . Blood Glucose Monitoring Suppl (BLOOD GLUCOSE METER KIT AND SUPPLIES) Dispense based on patient and insurance preference. To check blood sugars daily FOR ICD-9 250.00 (Patient taking differently: 1 each by Other route See admin instructions. Dispense based on patient and insurance preference. To check blood sugars daily FOR ICD-9 250.00)  . cetirizine (ZYRTEC) 10 MG tablet Take 10 mg by mouth daily as needed for allergies.   . cholestyramine (QUESTRAN) 4 g packet DISSOLVE & TAKE 1 POWDER BY MOUTH TWICE DAILY (Patient taking differently: Take 4 g by mouth 2 (two) times daily. )  . clobetasol cream (TEMOVATE) 2.72 % Apply 1 application topically daily as needed (for eczema).   . dicyclomine (BENTYL) 20 MG tablet Take 1 tablet (20 mg total) by mouth 4 (four) times daily -  before meals and at bedtime. (Patient taking differently: Take 20 mg by mouth 2 (two) times a day. )  . empagliflozin (JARDIANCE) 25 MG TABS tablet Take 25 mg by mouth daily.  . furosemide (LASIX) 20 MG tablet Take 1 tablet (20 mg total) by mouth daily.  . Glucose Blood (BLOOD GLUCOSE TEST STRIPS) STRP Use to test blood sugar up to twice a day (Patient taking differently: 1 each by Other route See admin instructions. Use to test blood sugar up to twice a day)  . guaiFENesin (ROBITUSSIN) 100 MG/5ML SOLN Take 5 mLs (100 mg total) by mouth every 4 (four) hours as needed for cough or to loosen phlegm.  Thurston Pounds 0.15-0.03 MG tablet Take 1 tablet by mouth once daily  . levothyroxine (SYNTHROID) 137 MCG tablet TAKE 1 TABLET BY MOUTH IN THE MORNING . APPOINTMENT REQUIRED FOR FUTURE REFILLS (Patient taking differently: Take 137 mcg by mouth every evening. )  . metFORMIN  (GLUCOPHAGE) 1000 MG tablet Take 1,000 mg by mouth 2 (two) times daily.   . naproxen sodium (ALEVE) 220 MG tablet Take 220 mg by mouth daily as needed (pain).   . nystatin (MYCOSTATIN/NYSTOP) powder Apply 1 application topically 3 (three) times daily. Use as needed for candida intertrigo (Patient taking differently: Apply 1 application topically 3 (three) times daily as needed (Use as needed for candida intertrigo). )  . ondansetron (ZOFRAN) 4 MG tablet Take 4 mg by mouth every 8 (eight) hours as needed for nausea/vomiting.  . ONE TOUCH ULTRA TEST test strip USE TO CHECK BLOOD SUGAR ONCE DAILY (  ICD CODE:25.00) (Patient taking differently: 1 each by Other route daily. )  . oxyCODONE (OXY IR/ROXICODONE) 5 MG immediate release tablet Take 5 mg by mouth every 4 (four) hours as needed for pain.  . Semaglutide,0.25 or 0.5MG/DOS, (OZEMPIC, 0.25 OR 0.5 MG/DOSE,) 2 MG/1.5ML SOPN Inject 0.5 mg into the skin once a week. Saturdays  . benzonatate (TESSALON) 200 MG capsule Take 1 capsule (200 mg total) by mouth 3 (three) times daily as needed for cough.   No facility-administered encounter medications on file as of 06/11/2019.     Review of Systems  Review of Systems  Constitutional: Negative for activity change, fatigue and fever.  HENT: Negative for sinus pressure, sinus pain and sore throat.   Respiratory: Positive for cough. Negative for shortness of breath and wheezing.   Cardiovascular: Negative for chest pain and palpitations.  Gastrointestinal: Negative for diarrhea, nausea and vomiting.  Musculoskeletal: Negative for arthralgias.  Neurological: Negative for dizziness.  Psychiatric/Behavioral: Negative for sleep disturbance. The patient is not nervous/anxious.      Physical Exam  BP 118/82   Pulse 84   Temp 97.7 F (36.5 C) (Temporal)   Ht '5\' 4"'  (1.626 m)   Wt 242 lb (109.8 kg)   SpO2 99% Comment: on RA  BMI 41.54 kg/m   Wt Readings from Last 5 Encounters:  06/11/19 242 lb (109.8 kg)   06/01/19 241 lb 9.6 oz (109.6 kg)  05/21/19 257 lb 11.5 oz (116.9 kg)  04/21/19 248 lb (112.5 kg)  02/03/19 250 lb (113.4 kg)    BMI Readings from Last 5 Encounters:  06/11/19 41.54 kg/m  06/01/19 41.47 kg/m  05/21/19 44.24 kg/m  04/21/19 42.57 kg/m  02/03/19 42.91 kg/m     Physical Exam Vitals and nursing note reviewed.  Constitutional:      Appearance: Normal appearance. She is obese.  HENT:     Head: Normocephalic and atraumatic.     Right Ear: External ear normal.     Left Ear: External ear normal.  Eyes:     Pupils: Pupils are equal, round, and reactive to light.  Cardiovascular:     Rate and Rhythm: Normal rate and regular rhythm.     Pulses: Normal pulses.     Heart sounds: Normal heart sounds. No murmur.  Pulmonary:     Effort: Pulmonary effort is normal. No respiratory distress.     Breath sounds: Normal breath sounds. No decreased air movement. No decreased breath sounds, wheezing or rales.  Abdominal:     General: Abdomen is flat. Bowel sounds are normal.     Palpations: Abdomen is soft.  Musculoskeletal:     Cervical back: Normal range of motion.     Right lower leg: No edema.     Left lower leg: No edema.  Skin:    General: Skin is warm and dry.     Capillary Refill: Capillary refill takes less than 2 seconds.  Neurological:     General: No focal deficit present.     Mental Status: She is alert and oriented to person, place, and time. Mental status is at baseline.     Gait: Gait normal.  Psychiatric:        Mood and Affect: Mood normal.        Behavior: Behavior normal.        Thought Content: Thought content normal.        Judgment: Judgment normal.       Assessment & Plan:   Acid reflux  Plan: Continue follow-up with primary care Continue to clinically watch for symptoms of acid reflux  Multifocal pneumonia Recent multifocal pneumonia High risk for aspiration Dysphagia 3 diet recommended well in the hospital Patient currently  eating normal diet without restrictions Patient trying to eat slow and sit upright Currently no signs of worsened aspiration or pneumonia No fevers  Plan: We will continue to clinically monitor Consider repeat chest x-ray at next office visit in 6 to 8 weeks   Acute respiratory failure with hypoxia ([place]) High suspicion of obstructive sleep apnea or or obesity hypoventilation syndrome or both  Currently recovered from multifocal pneumonia as well as acute respiratory failure  Plan: Continue to clinically monitor We will transition furosemide to 20 mg daily as needed for lower extremity swelling or weight gain  At risk for obstructive sleep apnea High suspicion of obstructive sleep apnea and/or obesity hypoventilation syndrome Last in lab study was 14 years ago was negative for obstructive sleep apnea per patient and mother  Discussion: Offered in lab sleep study.  Patient would like to think about this.  Patient has preferences that the sleep tech that does the in lab study be a female or that her mother can stay with her.  We will work on trying to coordinate this.  Patient, mother as well as father will discuss completing the in lab sleep study moving forward and they will contact with our office if they decide to move forward with this.  There are also concerns about whether or not the patient will be able to manage or be compliant with CPAP therapy.  Oral appliance is an option.  Father has obstructive sleep apnea managed on CPAP.  Mother has obstructive sleep apnea with an oral appliance.  Knee  Good family support  Plan: Patient will let us know if and when she decides to proceed forward with an in lab sleep study  Cough Improving cough Status post antibiotic treatment for multifocal pneumonia Responsive to Ladona Ridgel, requesting refill  Plan: Ladona Ridgel called in today    Return in about 4 weeks (around 07/09/2019), or if symptoms worsen or fail to improve, for  Follow up with Dr. Melvyn Novas.   Lauraine Rinne, NP 06/11/2019   This appointment required 42 minutes of patient care (this includes precharting, chart review, review of results, face-to-face care, etc.).

## 2019-06-13 ENCOUNTER — Other Ambulatory Visit: Payer: Self-pay | Admitting: Family Medicine

## 2019-06-14 ENCOUNTER — Institutional Professional Consult (permissible substitution): Payer: Medicare HMO | Admitting: Pulmonary Disease

## 2019-07-16 ENCOUNTER — Encounter: Payer: Self-pay | Admitting: Internal Medicine

## 2019-07-16 ENCOUNTER — Other Ambulatory Visit: Payer: Self-pay

## 2019-07-16 ENCOUNTER — Ambulatory Visit (INDEPENDENT_AMBULATORY_CARE_PROVIDER_SITE_OTHER): Payer: Medicare HMO | Admitting: Internal Medicine

## 2019-07-16 ENCOUNTER — Ambulatory Visit (INDEPENDENT_AMBULATORY_CARE_PROVIDER_SITE_OTHER): Payer: Medicare HMO

## 2019-07-16 DIAGNOSIS — J9601 Acute respiratory failure with hypoxia: Secondary | ICD-10-CM | POA: Diagnosis not present

## 2019-07-16 DIAGNOSIS — J189 Pneumonia, unspecified organism: Secondary | ICD-10-CM

## 2019-07-16 NOTE — Progress Notes (Signed)
_0  ID: Wanda Oneill, female    DOB: Apr 18, 1980, 39 y.o.   MRN: [id number]  Chief Complaint  Patient presents with  . Hospitalization Follow-up    Has been in the hospital twice since her surgery last month. Has a cough in the mornings from sinus drainage. Has not used any oxygen in over a week.     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  PMH: Downs syndrome, NIDDD. GERD, Gastritis, Gout, MO (wt 110.kg w/ BMI 41.74) Smoker/ Smoking History: Never Smoker  Maintenance: None Pt of: Dr. Melvyn Novas   06/11/2019  - NP office Visit  39 year old female never smoker initially consulted with our practice in April/15/2021.  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  Patient was later discharged on 05/23/2019.  Patient was O2 dependent and discharged with 4 L patient was requested to follow-up with Dr. Melvyn Novas Dr. Lamonte Sakai in 2 weeks with a chest x-ray.  Unfortunately since discharge patient was not satting well on 05/26/2019 she was instructed to present to the emergency room by Dr. Melvyn Novas for likely BiPAP.  Patient was then consulted again with our practice on 05/28/2019.  Patient was treated for acute hypoxic respiratory failure and bilateral infiltrates seem to be related to pneumonia most likely aspiration.  There is a concern for obstructive sleep apnea.  Patient was discharged on 06/01/2019.  During second hospitalization patient was recommended to change to a dysphagia 3 soft solid diet.  Patient was frustrated and unhappy with the  limitations of dysphagia 3 she was to resume a regular solid diet.  Patient was changed back to regular solids to make sure that they precut meats per patient request in order to make self eating easier.  Patient was encouraged to remain on PPI with reflux precautions. rec No change rx Consider sleep study           07/16/2019  f/u ov/Wanda Oneill re: f/u pna/ temporary R HD paralysis p nerve block for shoulder surgery Chief Complaint  Patient presents with  . Follow-up    occ cough in AM, no new SOB  Dyspnea:  Not limited by breathing from desired activities   Cough: variable first thing in am/ min mucoid  Sleeping: on 3 pillows  SABA use: none 02: none now   No obvious day to day or daytime variability or assoc excess/ purulent sputum or mucus plugs or hemoptysis or cp or chest tightness, subjective wheeze or overt sinus or hb symptoms.   Sleeping as above  without nocturnal  or early am exacerbation  of respiratory  c/o's or need for noct saba. Also denies any obvious fluctuation of symptoms with weather or environmental changes or other aggravating or alleviating factors except as outlined above   No unusual exposure hx or h/o childhood pna/ asthma or knowledge of premature birth.  Current Allergies, Complete Past Medical History, Past Surgical History, Family History, and Social History were reviewed in [employer] record.  ROS  The following are not active complaints unless bolded Hoarseness, sore throat, dysphagia, dental problems, itching, sneezing,  nasal congestion or  discharge of excess mucus or purulent secretions, ear ache,   fever, chills, sweats, unintended wt loss or wt gain, classically pleuritic or exertional cp,  orthopnea pnd or arm/hand swelling  or leg swelling, presyncope, palpitations, abdominal pain, anorexia, nausea, vomiting, diarrhea  or change in bowel habits or change in bladder habits, change in stools or change in urine, dysuria,  hematuria,  rash, arthralgias, visual complaints, headache, numbness, weakness or ataxia or problems with walking or coordination,  change in mood or  memory.        Current Meds  Medication Sig  . acetaminophen (TYLENOL) 325 MG tablet Take 650 mg by mouth every 6 (six) hours as needed for mild pain.   Marland Kitchen allopurinol (ZYLOPRIM) 300 MG tablet Take 1 tablet by mouth once daily  . benzonatate (TESSALON) 200 MG capsule Take 1 capsule (200 mg total) by mouth 3 (three) times daily as needed for cough.  . Blood Glucose Monitoring Suppl (BLOOD GLUCOSE METER KIT AND SUPPLIES) Dispense based on patient and insurance preference. To check blood sugars daily FOR ICD-9 250.00 (Patient taking differently: 1 each by Other route See admin instructions. Dispense based on patient and insurance preference. To check blood sugars daily FOR ICD-9 250.00)  . cetirizine (ZYRTEC) 10 MG tablet Take 10 mg by mouth daily as needed for allergies.   . cholestyramine (QUESTRAN) 4 g packet DISSOLVE & TAKE 1 POWDER BY MOUTH TWICE DAILY (Patient taking differently: Take 4 g by mouth 2 (two) times daily. )  . clobetasol cream (TEMOVATE) 0.93 % Apply 1 application topically daily as needed (for eczema).   . dicyclomine (BENTYL) 20 MG tablet Take 1 tablet (20 mg total) by mouth 4 (four) times daily -  before meals and at bedtime. (Patient taking differently: Take 20 mg by mouth 2 (two) times a day. )  . empagliflozin (JARDIANCE) 25 MG TABS tablet Take 25 mg by mouth daily.  . furosemide (LASIX) 20 MG tablet Take 1 tablet (20 mg total) by mouth daily.  . Glucose Blood (BLOOD GLUCOSE TEST STRIPS) STRP Use to test blood sugar up to twice a day (Patient taking differently: 1 each by Other route See admin instructions. Use to test blood sugar up to twice a day)  . guaiFENesin (ROBITUSSIN) 100 MG/5ML SOLN Take 5 mLs (100 mg total) by mouth every 4 (four) hours as needed for cough or to loosen phlegm.  Thurston Pounds 0.15-0.03 MG tablet Take 1  tablet by mouth once daily  . levothyroxine (EUTHYROX) 137 MCG tablet Take 1 tablet (137 mcg total) by mouth daily before breakfast.  . metFORMIN (GLUCOPHAGE) 1000 MG tablet Take 1,000 mg by mouth 2 (two) times daily.   . naproxen sodium (ALEVE) 220 MG tablet Take 220 mg by mouth daily as needed (pain).   . nystatin (MYCOSTATIN/NYSTOP) powder Apply 1 application topically 3 (three) times daily. Use as needed for candida intertrigo (Patient taking differently: Apply 1 application topically 3 (three) times daily as needed (Use as needed for candida intertrigo). )  . ondansetron (ZOFRAN) 4 MG tablet Take 4 mg by mouth every 8 (eight) hours as needed for nausea/vomiting.  . ONE TOUCH ULTRA TEST test strip USE TO CHECK BLOOD SUGAR ONCE DAILY (ICD CODE:25.00) (Patient taking differently: 1 each by Other route daily. )  . oxyCODONE (OXY IR/ROXICODONE) 5 MG immediate release tablet Take 5 mg by mouth every 4 (four) hours as needed for pain.  . Semaglutide,0.25 or 0.'5MG'$ /DOS, (OZEMPIC, 0.25 OR 0.5 MG/DOSE,) 2 MG/1.5ML  SOPN Inject 0.5 mg into the skin once a week. Saturdays        Past Medical History:  Diagnosis Date  . Diabetes mellitus    type II  . Down syndrome   . Family history of adverse reaction to anesthesia    mom has had n/v  . Gastritis   . GERD (gastroesophageal reflux disease)   . Headache   . Hepatic steatosis   . Hidradenitis   . Hypothyroidism   . Irritable bowel syndrome   . Periumbilical hernia   . Pneumonia   . Restless legs   . Tendonitis    chronic in left foot  . Thyroid disease    hypothyroidism       Physical Exam     07/16/2019       246   06/11/19 242 lb (109.8 kg)  06/01/19 241 lb 9.6 oz (109.6 kg)  05/21/19 257 lb 11.5 oz (116.9 kg)  04/21/19 248 lb (112.5 kg)  02/03/19 250 lb (113.4 kg)   Vital signs reviewed  07/16/2019  - Note at rest 02 sats  98% on RA     Very pleasant amb obese wf with typical down syndrome features but exceptionally sharp  mentation   HEENT : pt wearing mask not removed for exam due to covid -19 concerns.    NECK :  without JVD/Nodes/TM/ nl carotid upstrokes bilaterally   LUNGS: no acc muscle use,  Nl contour chest which is clear to A and P bilaterally without cough on insp or exp maneuvers   CV:  RRR  no s3 or murmur or increase in P2, and no edema   ABD:  Obese soft and nontender with nl inspiratory excursion in the supine position. No bruits or organomegaly appreciated, bowel sounds nl  MS:  Nl gait/ ext warm without deformities, calf tenderness, cyanosis or clubbing No obvious joint restrictions   SKIN: warm and dry without lesions    NEURO:  alert, approp, nl sensorium with  no motor or cerebellar deficits apparent.     CXR PA and Lateral:   07/16/2019 :    I personally reviewed images and agree with radiology impression as follows:   Previously seen infiltrates have resolved in the interval. No sizable effusion is noted. Degenerative change of the thoracic spine is seen. My impression:  The R HD is also now in the normal position.   Assessment & Plan:   Acid reflux Plan: Continue follow-up with primary care Continue to clinically watch for symptoms of acid reflux  Multifocal pneumonia Recent multifocal pneumonia High risk for aspiration Dysphagia 3 diet recommended well in the hospital Patient currently eating normal diet without restrictions Patient trying to eat slow and sit upright Currently no signs of worsened aspiration or pneumonia No fevers  Plan: We will continue to clinically monitor Consider repeat chest x-ray at next office visit in 6 to 8 weeks   Acute respiratory failure with hypoxia ([place]) High suspicion of obstructive sleep apnea or or obesity hypoventilation syndrome or both  Currently recovered from multifocal pneumonia as well as acute respiratory failure  Plan: Continue to clinically monitor We will transition furosemide to 20 mg daily as needed for  lower extremity swelling or weight gain  At risk for obstructive sleep apnea High suspicion of obstructive sleep apnea and/or obesity hypoventilation syndrome Last in lab study was 14 years ago was negative for obstructive sleep apnea per patient and mother  Discussion: Offered in lab sleep study.  Patient would like to think about this.  Patient has preferences that the sleep tech that does the in lab study be a female or that her mother can stay with her.  We will work on trying to coordinate this.  Patient, mother as well as father will discuss completing the in lab sleep study moving forward and they will contact with our office if they decide to move forward with this.  There are also concerns about whether or not the patient will be able to manage or be compliant with CPAP therapy.  Oral appliance is an option.  Father has obstructive sleep apnea managed on CPAP.  Mother has obstructive sleep apnea with an oral appliance.  Knee  Good family support  Plan: Patient will let us know if and when she decides to proceed forward with an in lab sleep study  Cough Improving cough Status post antibiotic treatment for multifocal pneumonia Responsive to Ladona Ridgel, requesting refill  Plan: Ladona Ridgel called in today    Return in about 4 weeks (around 07/09/2019), or if symptoms worsen or fail to improve, for Follow up with Dr. Melvyn Novas.   Wanda Rinne, NP 06/11/2019

## 2019-07-16 NOTE — Patient Instructions (Signed)
Make sure you check your oxygen saturations at highest level of activity to be sure it stays over 90%  -  Call if losing ground at all  Please remember to go to the  x-ray department  for your tests - we will call you with the results when they are available    Pulmonary follow up is as needed

## 2019-07-17 ENCOUNTER — Encounter: Payer: Self-pay | Admitting: Internal Medicine

## 2019-07-17 NOTE — Assessment & Plan Note (Addendum)
See admit 05/20/19 p R shoulder surgery assoc with ? R HD temporary paralysis      completely cleared 07/16/2019 with nl R HD position on f/u   >>>   no f/u needed

## 2019-07-17 NOTE — Assessment & Plan Note (Signed)
Resolved at this point.  The issue  Was also raised whether to do f/u sleep study.  She's doing great now clinically so I will leave this up to her PCP and father, a physician to consider at a later date if needed.   Did advise on regular ex and occasionally monitoring sats at peak ex as an early indication if there is any recurrent respiratory issue that would required f/u in this office >> f/u is prn         Each maintenance medication was reviewed in detail including emphasizing most importantly the difference between maintenance and prns and under what circumstances the prns are to be triggered using an action plan format where appropriate.    Total time for H and P, chart review, counseling, e and generating customized AVS unique to this office visit / charting = 20 min

## 2019-07-19 NOTE — Progress Notes (Signed)
LMTCB x 1 

## 2019-07-20 NOTE — Progress Notes (Signed)
Left detailed msg ok per DPR

## 2019-08-13 ENCOUNTER — Other Ambulatory Visit: Payer: Self-pay | Admitting: Family Medicine

## 2019-08-16 ENCOUNTER — Other Ambulatory Visit: Payer: Self-pay

## 2019-08-16 MED ORDER — TRUE METRIX BLOOD GLUCOSE TEST VI STRP: ORAL_STRIP | 12 refills | Status: DC

## 2019-08-16 MED ORDER — FREESTYLE TEST VI STRP: ORAL_STRIP | 12 refills | Status: AC

## 2019-08-16 MED ORDER — TRUEPLUS LANCETS 30G MISC: 3 refills | Status: AC

## 2019-08-16 MED ORDER — TRUE METRIX METER W/DEVICE KIT: PACK | 0 refills | Status: DC

## 2019-08-30 ENCOUNTER — Other Ambulatory Visit: Payer: Self-pay | Admitting: Gastroenterology

## 2019-09-01 ENCOUNTER — Ambulatory Visit (INDEPENDENT_AMBULATORY_CARE_PROVIDER_SITE_OTHER): Payer: Medicare HMO | Admitting: Internal Medicine

## 2019-09-01 ENCOUNTER — Encounter: Payer: Self-pay | Admitting: Internal Medicine

## 2019-09-01 ENCOUNTER — Other Ambulatory Visit: Payer: Self-pay

## 2019-09-01 VITALS — BP 117/74 | HR 81 | Temp 98.2°F | Resp 18 | Ht 64.0 in | Wt 237.5 lb

## 2019-09-01 DIAGNOSIS — H6122 Impacted cerumen, left ear: Secondary | ICD-10-CM

## 2019-09-01 NOTE — Progress Notes (Signed)
Subjective:    Patient ID: Wanda Oneill, female    DOB: 1981/01/17, 39 y.o.   MRN: [id number]  DOS:  09/01/2019 Type of visit - description: Acute For the last 1 or 2 weeks, has developed itching and stuffy feeling of the ears, worse on the left.  No fever chills No ear discharge No recent URI Hearing is normal    Review of Systems See above   Past Medical History:  Diagnosis Date  . Diabetes mellitus    type II  . Down syndrome   . Family history of adverse reaction to anesthesia    mom has had n/v  . Gastritis   . GERD (gastroesophageal reflux disease)   . Headache   . Hepatic steatosis   . Hidradenitis   . Hypothyroidism   . Irritable bowel syndrome   . Periumbilical hernia   . Pneumonia   . Restless legs   . Tendonitis    chronic in left foot  . Thyroid disease    hypothyroidism    Past Surgical History:  Procedure Laterality Date  . AXILLARY HIDRADENITIS EXCISION Bilateral   . CHOLECYSTECTOMY    . HERNIA REPAIR     multiple  . INCISIONAL HERNIA REPAIR  05/30/2015   LAPROSCOPIC  . INCISIONAL HERNIA REPAIR N/A 05/30/2015   Procedure: LAPAROSCOPIC INCISIONAL HERNIA;  Surgeon: Rolm Bookbinder, MD;  Location: [facility];  Service: General;  Laterality: N/A;  . INSERTION OF MESH N/A 05/30/2015   Procedure: INSERTION OF MESH;  Surgeon: Rolm Bookbinder, MD;  Location: [facility];  Service: General;  Laterality: N/A;  . KNEE ARTHROSCOPY     right knee  . LAPAROSCOPY N/A 05/30/2015   Procedure: LAPAROSCOPY DIAGNOSTIC;  Surgeon: Rolm Bookbinder, MD;  Location: [facility];  Service: General;  Laterality: N/A;  . PARTIAL KNEE ARTHROPLASTY Right 10/30/2017   Procedure: UNICOMPARTMENTAL RIGHT KNEE LATERAL;  Surgeon: Paralee Cancel, MD;  Location: [facility];  Service: Orthopedics;  Laterality: Right;  90 mins  . TONSILLECTOMY     adenoids    Allergies as of 09/01/2019      Reactions   Vancomycin Itching   Cefuroxime Axetil Hives   Sulfa Antibiotics Hives      Medication  List       Accurate as of September 01, 2019  3:00 PM. If you have any questions, ask your nurse or doctor.        acetaminophen 325 MG tablet Commonly known as: TYLENOL Take 650 mg by mouth every 6 (six) hours as needed for mild pain.   allopurinol 300 MG tablet Commonly known as: ZYLOPRIM Take 1 tablet by mouth once daily   benzonatate 200 MG capsule Commonly known as: TESSALON Take 1 capsule (200 mg total) by mouth 3 (three) times daily as needed for cough.   blood glucose meter kit and supplies Dispense based on patient and insurance preference. To check blood sugars daily FOR ICD-9 250.00 What changed:   how much to take  how to take this  when to take this   True Metrix Meter w/Device Kit Use as directed to check glucose up to two times daily. Dx Code E11.9 What changed: Another medication with the same name was changed. Make sure you understand how and when to take each.   cetirizine 10 MG tablet Commonly known as: ZYRTEC Take 10 mg by mouth daily as needed for allergies.   cholestyramine 4 g packet Commonly known as: QUESTRAN DISSOLVE & TAKE 1 PACKET OF POWDER  BY MOUTH TWICE DAILY   clobetasol cream 0.05 % Commonly known as: TEMOVATE Apply 1 application topically daily as needed (for eczema).   dicyclomine 20 MG tablet Commonly known as: BENTYL Take 1 tablet (20 mg total) by mouth 4 (four) times daily -  before meals and at bedtime. What changed: when to take this   furosemide 20 MG tablet Commonly known as: Lasix Take 1 tablet (20 mg total) by mouth daily.   guaiFENesin 100 MG/5ML Soln Commonly known as: ROBITUSSIN Take 5 mLs (100 mg total) by mouth every 4 (four) hours as needed for cough or to loosen phlegm.   Jardiance 25 MG Tabs tablet Generic drug: empagliflozin Take 25 mg by mouth daily.   Jolessa 0.15-0.03 MG tablet Generic drug: levonorgestrel-ethinyl estradiol Take 1 tablet by mouth once daily   levothyroxine 137 MCG tablet Commonly  known as: Euthyrox Take 1 tablet (137 mcg total) by mouth daily before breakfast.   metFORMIN 1000 MG tablet Commonly known as: GLUCOPHAGE Take 1,000 mg by mouth 2 (two) times daily.   naproxen sodium 220 MG tablet Commonly known as: ALEVE Take 220 mg by mouth daily as needed (pain).   nystatin powder Commonly known as: MYCOSTATIN/NYSTOP Apply 1 application topically 3 (three) times daily. Use as needed for candida intertrigo   ondansetron 4 MG tablet Commonly known as: ZOFRAN Take 4 mg by mouth every 8 (eight) hours as needed for nausea/vomiting.   ONE TOUCH ULTRA TEST test strip Generic drug: glucose blood USE TO CHECK BLOOD SUGAR ONCE DAILY (ICD CODE:25.00) What changed: See the new instructions.   BLOOD GLUCOSE TEST STRIPS Strp 1 each by Other route See admin instructions. Use to test blood sugar up to twice a day What changed: Another medication with the same name was changed. Make sure you understand how and when to take each.   FREESTYLE TEST STRIPS test strip Generic drug: glucose blood Use as directed to test blood sugar up to twice a day. Dx code E11.9 What changed: Another medication with the same name was changed. Make sure you understand how and when to take each.   True Metrix Blood Glucose Test test strip Generic drug: glucose blood Use as instructed to check glucose up to 2 times daily. Dx Code E11.9 What changed: Another medication with the same name was changed. Make sure you understand how and when to take each.   oxyCODONE 5 MG immediate release tablet Commonly known as: Oxy IR/ROXICODONE Take 5 mg by mouth every 4 (four) hours as needed for pain.   Ozempic (0.25 or 0.5 MG/DOSE) 2 MG/1.5ML Sopn Generic drug: Semaglutide(0.25 or 0.5MG/DOS) Inject 0.5 mg into the skin once a week. Saturdays   TRUEplus Lancets 30G Misc Use as directed to check glucose up to two times daily. Dx code E11.9          Objective:   Physical Exam BP 117/74 (BP  Location: Left Arm, Patient Position: Sitting, Cuff Size: Normal)   Pulse 81   Temp 98.2 F (36.8 C) (Oral)   Resp 18   Ht 5' 4" (1.626 m)   Wt (!) 237 lb 8 oz (107.7 kg)   SpO2 98%   BMI 40.77 kg/m  General:   Well developed, NAD, BMI noted. HEENT:  Right ear: Minute amount of dry wax at the canal noted.  TM slightly bulged but not red Left ear: + Dry wax noted, partially removed with a spoon. Skin: Not pale. Not jaundice Neurologic:  alert &   oriented X3.  Speech normal, gait appropriate for age and unassisted Psych--  Cognition and judgment appear intact.  Cooperative with normal attention span and concentration.  Behavior appropriate. No anxious or depressed appearing.      Assessment    38-year-old female, PMH includes  Down syndrome (highly functional) thyroid disease, DM, chronic diarrhea, morbid obesity, history of respiratory failure, here by herself, on multiple meds, presents with:  Cerumen impaction: + Dry wax at the left ear, partially removed with a spoon, she still has a moderate amount there, discuss OTC H2O2 versus lavage, elected lavage: After the left ear lavage, a small amount of wax was removed with, she still have some in.  Hard to see the TM.  Canals are very narrow. Plan: Carbamide peroxide twice daily for a week, call if not better.   This visit occurred during the SARS-CoV-2 public health emergency.  Safety protocols were in place, including screening questions prior to the visit, additional usage of staff PPE, and extensive cleaning of exam room while observing appropriate contact time as indicated for disinfecting solutions.  

## 2019-09-01 NOTE — Patient Instructions (Signed)
Get over-the-counter eardrops called  carbamide peroxide also know as [place]; ask the pharmacist if needed if you can find them.  Put 3 to 4 drops on each ear, twice a day for about a week.  Call if not gradually better

## 2019-09-01 NOTE — Progress Notes (Signed)
Pre visit review using our clinic review tool, if applicable. No additional management support is needed unless otherwise documented below in the visit note. 

## 2019-09-06 DIAGNOSIS — M1712 Unilateral primary osteoarthritis, left knee: Secondary | ICD-10-CM | POA: Diagnosis not present

## 2019-09-06 DIAGNOSIS — M25562 Pain in left knee: Secondary | ICD-10-CM | POA: Diagnosis not present

## 2019-09-13 DIAGNOSIS — E010 Iodine-deficiency related diffuse (endemic) goiter: Secondary | ICD-10-CM | POA: Diagnosis not present

## 2019-09-13 DIAGNOSIS — E039 Hypothyroidism, unspecified: Secondary | ICD-10-CM | POA: Diagnosis not present

## 2019-09-13 DIAGNOSIS — E781 Pure hyperglyceridemia: Secondary | ICD-10-CM | POA: Diagnosis not present

## 2019-09-13 DIAGNOSIS — E1165 Type 2 diabetes mellitus with hyperglycemia: Secondary | ICD-10-CM | POA: Diagnosis not present

## 2019-09-14 ENCOUNTER — Other Ambulatory Visit: Payer: Self-pay | Admitting: Endocrinology

## 2019-09-14 DIAGNOSIS — E010 Iodine-deficiency related diffuse (endemic) goiter: Secondary | ICD-10-CM

## 2019-09-17 ENCOUNTER — Ambulatory Visit
Admission: RE | Admit: 2019-09-17 | Discharge: 2019-09-17 | Disposition: A | Discharge status: Home / Self Care | Payer: Medicare HMO | Source: Ambulatory Visit | Attending: Endocrinology | Admitting: Endocrinology

## 2019-09-17 DIAGNOSIS — E041 Nontoxic single thyroid nodule: Secondary | ICD-10-CM | POA: Diagnosis not present

## 2019-09-17 DIAGNOSIS — E010 Iodine-deficiency related diffuse (endemic) goiter: Secondary | ICD-10-CM

## 2019-09-20 DIAGNOSIS — M25562 Pain in left knee: Secondary | ICD-10-CM | POA: Diagnosis not present

## 2019-10-07 DIAGNOSIS — M1712 Unilateral primary osteoarthritis, left knee: Secondary | ICD-10-CM | POA: Diagnosis not present

## 2019-10-07 DIAGNOSIS — M170 Bilateral primary osteoarthritis of knee: Secondary | ICD-10-CM | POA: Diagnosis not present

## 2019-10-07 DIAGNOSIS — M1711 Unilateral primary osteoarthritis, right knee: Secondary | ICD-10-CM | POA: Diagnosis not present

## 2019-11-03 ENCOUNTER — Other Ambulatory Visit: Payer: Self-pay | Admitting: Family Medicine

## 2019-11-24 DIAGNOSIS — M1712 Unilateral primary osteoarthritis, left knee: Secondary | ICD-10-CM | POA: Diagnosis not present

## 2019-12-03 DIAGNOSIS — E119 Type 2 diabetes mellitus without complications: Secondary | ICD-10-CM | POA: Diagnosis not present

## 2019-12-10 DIAGNOSIS — M1712 Unilateral primary osteoarthritis, left knee: Secondary | ICD-10-CM | POA: Diagnosis not present

## 2019-12-27 DIAGNOSIS — M1712 Unilateral primary osteoarthritis, left knee: Secondary | ICD-10-CM | POA: Diagnosis not present

## 2020-01-03 DIAGNOSIS — E039 Hypothyroidism, unspecified: Secondary | ICD-10-CM | POA: Diagnosis not present

## 2020-01-03 DIAGNOSIS — E1165 Type 2 diabetes mellitus with hyperglycemia: Secondary | ICD-10-CM | POA: Diagnosis not present

## 2020-01-03 DIAGNOSIS — E781 Pure hyperglyceridemia: Secondary | ICD-10-CM | POA: Diagnosis not present

## 2020-01-05 ENCOUNTER — Other Ambulatory Visit: Payer: Self-pay

## 2020-01-05 ENCOUNTER — Encounter: Payer: Self-pay | Admitting: Family Medicine

## 2020-01-05 ENCOUNTER — Ambulatory Visit (INDEPENDENT_AMBULATORY_CARE_PROVIDER_SITE_OTHER): Payer: Medicare HMO | Admitting: Family Medicine

## 2020-01-05 VITALS — BP 128/82 | HR 77 | Resp 19 | Ht 64.0 in | Wt 232.0 lb

## 2020-01-05 DIAGNOSIS — Z8739 Personal history of other diseases of the musculoskeletal system and connective tissue: Secondary | ICD-10-CM

## 2020-01-05 DIAGNOSIS — M25571 Pain in right ankle and joints of right foot: Secondary | ICD-10-CM | POA: Diagnosis not present

## 2020-01-05 LAB — URIC ACID: Uric Acid, Serum: 4.6 mg/dL (ref 2.4–7.0)

## 2020-01-05 MED ORDER — CHOLESTYRAMINE 4 G PO PACK: PACK | ORAL | 3 refills | Status: DC

## 2020-01-05 NOTE — Patient Instructions (Addendum)
It was so good to see you again today as always! I am going to set you up with physical therapy in the [facility] area  Since you are traveling to [place] next month let's go ahead and have you see ortho for your ankle as well so we can get on top of this problem  Have a wonderful holiday season!  Please let me know if you need anything further as far as your ankle I would recommend a covid booster for you if not done already

## 2020-01-05 NOTE — Progress Notes (Addendum)
[facility] [address], [place], [place] [address] [phone]  Date:  01/05/2020   Name:  Wanda Oneill   DOB:  1980-07-23   MRN:  [id number]  PCP:  Darreld Mclean, MD    Chief Complaint: Ankle Pain (right ankle pain, using brace, no known injury)   History of Present Illness:  Wanda Oneill is a 39 y.o. very pleasant female patient who presents with the following:  Pt last seen by myself in March of this year history of diabetes, hypothyroidism, obesity, Down syndrome and several associated MSK problems Dr Chalmers Cater manages her diabetes and thyroid   She has noted right ankle pain for about a month She is not aware of any injury They did travel to [place] last month and did a bit more walking than she is used to-no actual hiking however per her report She was walking more on unstable surfaces such as sand which exacerbated her pain She is using aleve as needed during the day- did not take any today however She is using Tylenol at night  She is using an OTC ankle brace, which does help somewhat  She does note a history of frequent ankle sprains when she was younger, but not as much of an issue the last 15 years  She has "bad knees" as well which does not help her ankle pain; she has had several injections for her left knee, partial knee replacement 2019  She has lost some weight! About 15 lbs since June  She does have history of gout, is taking allopurinol 300 mg  Wt Readings from Last 3 Encounters:  01/05/20 232 lb (105.2 kg)  09/01/19 (!) 237 lb 8 oz (107.7 kg)  07/16/19 246 lb 6.4 oz (111.8 kg)    Patient Active Problem List   Diagnosis Date Noted  . At risk for obstructive sleep apnea 06/11/2019  . Cough 06/11/2019  . Multifocal pneumonia   . Acute respiratory failure with hypoxia ([facility]) 05/26/2019  . Hyperglycemia 05/26/2019  . Acute respiratory failure with hypoxemia ([facility]) 05/23/2019  . Acute  hypoxemic respiratory failure ([facility]) 05/20/2019  . Fibromyalgia 03/09/2019  . Neck pain, chronic 03/09/2019  . Acute shoulder bursitis, right 02/03/2019  . Bile salt-induced diarrhea 09/25/2018  . Arthritis of left acromioclavicular joint 09/23/2018  . Precordial chest pain 08/11/2018  . Morbid obesity ([facility]) 10/31/2017  . S/P right UKR, lateral 10/30/2017  . Incarcerated epigastric hernia 05/30/2015  . Left foot pain 08/09/2014  . Chronic diarrhea 07/27/2014  . Gout 08/10/2012  . Acid reflux 01/22/2012  . Down syndrome 11/11/2011  . Hernia of abdominal wall 10/21/2011  . Psoriasis 10/21/2011  . Type 2 diabetes mellitus, uncontrolled ([facility]) 04/12/2011  . Hypothyroid 04/12/2011  . Gait abnormality 06/22/2010  . Tibial tendinitis, posterior 06/22/2010    Past Medical History:  Diagnosis Date  . Diabetes mellitus    type II  . Down syndrome   . Family history of adverse reaction to anesthesia    mom has had n/v  . Gastritis   . GERD (gastroesophageal reflux disease)   . Headache   . Hepatic steatosis   . Hidradenitis   . Hypothyroidism   . Irritable bowel syndrome   . Periumbilical hernia   . Pneumonia   . Restless legs   . Tendonitis    chronic in left foot  . Thyroid disease    hypothyroidism    Past Surgical  History:  Procedure Laterality Date  . AXILLARY HIDRADENITIS EXCISION Bilateral   . CHOLECYSTECTOMY    . HERNIA REPAIR     multiple  . INCISIONAL HERNIA REPAIR  05/30/2015   LAPROSCOPIC  . INCISIONAL HERNIA REPAIR N/A 05/30/2015   Procedure: LAPAROSCOPIC INCISIONAL HERNIA;  Surgeon: Rolm Bookbinder, MD;  Location: [facility];  Service: General;  Laterality: N/A;  . INSERTION OF MESH N/A 05/30/2015   Procedure: INSERTION OF MESH;  Surgeon: Rolm Bookbinder, MD;  Location: [facility];  Service: General;  Laterality: N/A;  . KNEE ARTHROSCOPY     right knee  . LAPAROSCOPY N/A 05/30/2015   Procedure: LAPAROSCOPY DIAGNOSTIC;  Surgeon: Rolm Bookbinder, MD;  Location: [facility];  Service: General;  Laterality: N/A;  . PARTIAL KNEE ARTHROPLASTY Right 10/30/2017   Procedure: UNICOMPARTMENTAL RIGHT KNEE LATERAL;  Surgeon: Paralee Cancel, MD;  Location: [facility];  Service: Orthopedics;  Laterality: Right;  90 mins  . TONSILLECTOMY     adenoids    Social History   Tobacco Use  . Smoking status: Never Smoker  . Smokeless tobacco: Never Used  Vaping Use  . Vaping Use: Never used  Substance Use Topics  . Alcohol use: No    Alcohol/week: 0.0 standard drinks  . Drug use: No    Family History  Problem Relation Age of Onset  . Thyroid cancer Mother   . Diabetes type II Mother   . High Cholesterol Mother   . Heart disease Mother   . Diabetes type II Father   . Hypertension Father   . Stroke Father     Allergies  Allergen Reactions  . Vancomycin Itching  . Cefuroxime Axetil Hives  . Sulfa Antibiotics Hives    Medication list has been reviewed and updated.  Current Outpatient Medications on File Prior to Visit  Medication Sig Dispense Refill  . acetaminophen (TYLENOL) 325 MG tablet Take 650 mg by mouth every 6 (six) hours as needed for mild pain.     Marland Kitchen allopurinol (ZYLOPRIM) 300 MG tablet Take 1 tablet by mouth once daily 90 tablet 3  . Blood Glucose Monitoring Suppl (BLOOD GLUCOSE METER KIT AND SUPPLIES) Dispense based on patient and insurance preference. To check blood sugars daily FOR ICD-9 250.00 (Patient taking differently: 1 each by Other route See admin instructions. Dispense based on patient and insurance preference. To check blood sugars daily FOR ICD-9 250.00) 1 each 0  . Blood Glucose Monitoring Suppl (TRUE METRIX METER) w/Device KIT Use as directed to check glucose up to two times daily. Dx Code E11.9 1 kit 0  . cetirizine (ZYRTEC) 10 MG tablet Take 10 mg by mouth daily as needed for allergies.     . cholestyramine (QUESTRAN) 4 g packet DISSOLVE & TAKE 1 PACKET OF POWDER BY MOUTH TWICE DAILY 180 each 1  . clobetasol cream (TEMOVATE) 1.79 % Apply  1 application topically daily as needed (for eczema).     . dicyclomine (BENTYL) 20 MG tablet Take 1 tablet (20 mg total) by mouth 4 (four) times daily -  before meals and at bedtime. (Patient taking differently: Take 20 mg by mouth 2 (two) times a day. ) 360 tablet 3  . empagliflozin (JARDIANCE) 25 MG TABS tablet Take 25 mg by mouth daily.    . Glucose Blood (BLOOD GLUCOSE TEST STRIPS) STRP 1 each by Other route See admin instructions. Use to test blood sugar up to twice a day 100 strip 3  . glucose blood (FREESTYLE TEST STRIPS) test  strip Use as directed to test blood sugar up to twice a day. Dx code E11.9 100 each 12  . glucose blood (TRUE METRIX BLOOD GLUCOSE TEST) test strip Use as instructed to check glucose up to 2 times daily. Dx Code E11.9 100 each 12  . levonorgestrel-ethinyl estradiol (JOLESSA) 0.15-0.03 MG tablet Take 1 tablet by mouth daily. 84 tablet 3  . levothyroxine (EUTHYROX) 137 MCG tablet Take 1 tablet (137 mcg total) by mouth daily before breakfast. 90 tablet 3  . metFORMIN (GLUCOPHAGE) 1000 MG tablet Take 1,000 mg by mouth 2 (two) times daily.   2  . naproxen sodium (ALEVE) 220 MG tablet Take 220 mg by mouth daily as needed (pain).     . ondansetron (ZOFRAN) 4 MG tablet Take 4 mg by mouth every 8 (eight) hours as needed for nausea/vomiting.     . ONE TOUCH ULTRA TEST test strip USE TO CHECK BLOOD SUGAR ONCE DAILY (ICD CODE:25.00) (Patient taking differently: 1 each by Other route daily. ) 100 each 3  . Semaglutide,0.25 or 0.5MG/DOS, (OZEMPIC, 0.25 OR 0.5 MG/DOSE,) 2 MG/1.5ML SOPN Inject 0.5 mg into the skin once a week. Saturdays    . TRUEplus Lancets 30G MISC Use as directed to check glucose up to two times daily. Dx code E11.9 100 each 3   No current facility-administered medications on file prior to visit.    Review of Systems:  As per HPI- otherwise negative.   Physical Examination: Vitals:   01/05/20 1102  BP: 128/82  Pulse: 77  Resp: 19  SpO2: 97%   Vitals:    01/05/20 1102  Weight: 232 lb (105.2 kg)  Height: '5\' 4"'  (1.626 m)   Body mass index is 39.82 kg/m. Ideal Body Weight: Weight in (lb) to have BMI = 25: 145.3  GEN: no acute distress.  Obese but has lost weight, looks well HEENT: Atraumatic, Normocephalic.  Ears and Nose: No external deformity. CV: RRR, No M/G/R. No JVD. No thrill. No extra heart sounds. PULM: CTA B, no wheezes, crackles, rhonchi. No retractions. No resp. distress. No accessory muscle use. EXTR: No c/c/e PSYCH: Normally interactive. Conversant.  Right ankle exam is relatively benign.  No heat, swelling, or point tenderness is appreciated.  She is wearing a properly fitted over-the-counter ankle brace I am not able to demonstrate any particular tender point in her ankle   Assessment and Plan: Acute right ankle pain - Plan: Ambulatory referral to Physical Therapy, Ambulatory referral to Orthopedic Surgery  History of gout - Plan: Uric acid  Wanda Oneill is a charming woman with Down syndrome who is here today with right ankle pain.  She has noticed this for about a month, history of frequent ankle sprains but no acute injury.  She is concerned because the family plans another international trip to see one of her siblings in [place] knows there will be a lot walking and would like to maximize her physical abilities As such, we will go ahead and refer her to physical therapy and also to see a foot and ankle specialist for any contribution they may have. We will check a uric acid level for history of gout  Jerome did request a note for her school, stating that she can elevate her feet and legs as needed for pain.  I forgot to give this to her but will mail to her home  Discussed her care plan with her father, Dr. Ruben Reason, on the telephone today with patient's permission  This visit occurred  during the SARS-CoV-2 public health emergency.  Safety protocols were in place, including screening questions prior to the visit,  additional usage of staff PPE, and extensive cleaning of exam room while observing appropriate contact time as indicated for disinfecting solutions.     Signed Lamar Blinks, MD  Received her uric acid level- letter to pt  Results for orders placed or performed in visit on 01/05/20  Uric acid  Result Value Ref Range   Uric Acid, Serum 4.6 2.4 - 7.0 mg/dL

## 2020-01-10 DIAGNOSIS — E781 Pure hyperglyceridemia: Secondary | ICD-10-CM | POA: Diagnosis not present

## 2020-01-10 DIAGNOSIS — E010 Iodine-deficiency related diffuse (endemic) goiter: Secondary | ICD-10-CM | POA: Diagnosis not present

## 2020-01-10 DIAGNOSIS — E039 Hypothyroidism, unspecified: Secondary | ICD-10-CM | POA: Diagnosis not present

## 2020-01-10 DIAGNOSIS — E1165 Type 2 diabetes mellitus with hyperglycemia: Secondary | ICD-10-CM | POA: Diagnosis not present

## 2020-01-12 ENCOUNTER — Ambulatory Visit: Payer: Medicare HMO | Admitting: Physical Therapy

## 2020-01-19 DIAGNOSIS — M79671 Pain in right foot: Secondary | ICD-10-CM | POA: Diagnosis not present

## 2020-01-19 DIAGNOSIS — M25571 Pain in right ankle and joints of right foot: Secondary | ICD-10-CM | POA: Diagnosis not present

## 2020-01-19 DIAGNOSIS — M7671 Peroneal tendinitis, right leg: Secondary | ICD-10-CM | POA: Diagnosis not present

## 2020-02-01 ENCOUNTER — Other Ambulatory Visit: Payer: Self-pay

## 2020-02-01 ENCOUNTER — Ambulatory Visit: Payer: Medicare HMO | Attending: Family Medicine | Admitting: Physical Therapy

## 2020-02-01 ENCOUNTER — Encounter: Payer: Self-pay | Admitting: Physical Therapy

## 2020-02-01 DIAGNOSIS — R600 Localized edema: Secondary | ICD-10-CM

## 2020-02-01 DIAGNOSIS — R262 Difficulty in walking, not elsewhere classified: Secondary | ICD-10-CM | POA: Insufficient documentation

## 2020-02-01 DIAGNOSIS — M25571 Pain in right ankle and joints of right foot: Secondary | ICD-10-CM

## 2020-02-01 NOTE — Therapy (Signed)
[facility] [place], [place], [address] Phone: [phone]   Fax:  [phone]  Physical Therapy Evaluation  Patient Details  Name: Wanda Oneill MRN: [id number] Date of Birth: 11-25-1980 Referring Provider (PT): J. Copland   Encounter Date: 02/01/2020   PT End of Session - 02/01/20 1643    Visit Number 1    Number of Visits 12    Date for PT Re-Evaluation 04/03/20    Authorization Type [employer]    PT Start Time [id number]    PT Stop Time 1700    PT Time Calculation (min) 54 min    Activity Tolerance Patient tolerated treatment well    Behavior During Therapy WFL for tasks assessed/performed           Past Medical History:  Diagnosis Date   Diabetes mellitus    type II   Down syndrome    Family history of adverse reaction to anesthesia    mom has had n/v   Gastritis    GERD (gastroesophageal reflux disease)    Headache    Hepatic steatosis    Hidradenitis    Hypothyroidism    Irritable bowel syndrome    Periumbilical hernia    Pneumonia    Restless legs    Tendonitis    chronic in left foot   Thyroid disease    hypothyroidism    Past Surgical History:  Procedure Laterality Date   AXILLARY HIDRADENITIS EXCISION Bilateral    CHOLECYSTECTOMY     HERNIA REPAIR     multiple   INCISIONAL HERNIA REPAIR  05/30/2015   LAPROSCOPIC   INCISIONAL HERNIA REPAIR N/A 05/30/2015   Procedure: LAPAROSCOPIC INCISIONAL HERNIA;  Surgeon: Rolm Bookbinder, MD;  Location: [facility];  Service: General;  Laterality: N/A;   INSERTION OF MESH N/A 05/30/2015   Procedure: INSERTION OF MESH;  Surgeon: Rolm Bookbinder, MD;  Location: [facility];  Service: General;  Laterality: N/A;   KNEE ARTHROSCOPY     right knee   LAPAROSCOPY N/A 05/30/2015   Procedure: LAPAROSCOPY DIAGNOSTIC;  Surgeon: Rolm Bookbinder, MD;  Location: [facility];  Service: General;  Laterality: N/A;   PARTIAL KNEE ARTHROPLASTY Right  10/30/2017   Procedure: UNICOMPARTMENTAL RIGHT KNEE LATERAL;  Surgeon: Paralee Cancel, MD;  Location[facility];  Service: Orthopedics;  Laterality: Right;  90 mins   TONSILLECTOMY     adenoids    There were no vitals filed for this visit.    Subjective Assessment - 02/01/20 1612    Subjective Patient reports that she has right ankle and foot pain.  Patient reports that she went on a family vacation to [place], she reports that the right ankle and foot has really hurt since the vacation, she bought an ankle brace at [employer] that gives "support", she reports that the pain has remained since she came back.  She has a partial right TKA about 2 years ago and reports that she is having left knee pain.    Limitations Standing;Walking;House hold activities    Patient Stated Goals have less pain, walk better    Currently in Pain? Yes    Pain Score 2     Pain Location Ankle    Pain Orientation Right    Pain Descriptors / Indicators Aching;Sore    Pain Type Acute pain    Pain Onset More than a month ago    Pain Frequency Constant    Aggravating Factors  at end of day, walking, walking  on beach pain will be up to 7/10    Pain Relieving Factors rest, lie down pain will come down to a 2/10    Effect of Pain on Daily Activities difficulty walking, pain              [facility] PT Assessment - 02/01/20 0001      Assessment   Medical Diagnosis right ankle and foot pain    Referring Provider (PT) J. Copland    Onset Date/Surgical Date 01/02/20    Prior Therapy not for the ankle      Precautions   Precautions None      Balance Screen   Has the patient fallen in the past 6 months No    Has the patient had a decrease in activity level because of a fear of falling?  No    Is the patient reluctant to leave their home because of a fear of falling?  No      Home Environment   Additional Comments single story, does cooking and cleaning      Prior Function   Level of Independence Independent    Astronomer Requirements Lifespan    Leisure no exercises      ROM / Strength   AROM / PROM / Strength AROM;Strength      AROM   AROM Assessment Site Ankle    Right/Left Ankle Right    Right Ankle Dorsiflexion 2    Right Ankle Plantar Flexion 45    Right Ankle Inversion 30    Right Ankle Eversion 2      Strength   Overall Strength Comments 4/5 except eversion 4-/5 with some pain laterally      Flexibility   Soft Tissue Assessment /Muscle Length --   tight calves     Palpation   Palpation comment some tenderness around the lateral malleolous      Ambulation/Gait   Gait Comments no device, slow, has a very loud heel strike but kind of shuffles her feet                      Objective measurements completed on examination: See above findings.               PT Education - 02/01/20 1642    Education Details gave HEP for ankle circles, pumps and ABC's    Person(s) Educated Patient    Methods Explanation;Demonstration;Tactile cues;Handout    Comprehension Verbalized understanding;Need further instruction            PT Short Term Goals - 02/01/20 1650      PT SHORT TERM GOAL #1   Title independent with initial HEP    Time 2    Period Weeks    Status New             PT Long Term Goals - 02/01/20 1650      PT LONG TERM GOAL #1   Title understand RICE    Time 8    Period Weeks    Status New      PT LONG TERM GOAL #2   Title dcrease pain 50%    Time 8    Period Weeks    Status New      PT LONG TERM GOAL #3   Title increase right ankle eversion to 10 degrees    Time 8    Period Weeks    Status New  PT LONG TERM GOAL #4   Title walk on all surfaces without limp and with minimal; pain    Time 8    Period Weeks    Status New                  Plan - 02/01/20 1645    Clinical Impression Statement Patient reports that she has had pain for about 6 weeks, she reports tha tshe went to [place] for vacation and that  she thinks she twisted the ankle on the beach and did a lot more walking than she is used to doing, she has right ankle pain laterally, some tenderness here, she has limited eversion and is weak with eversion, she limps and shuffles her feet.  She had a right partial knee replacement about two years ago and reports that she is having left knee pain as well, she will be seeing the knee MD Janaury 10th.    Stability/Clinical Decision Making Stable/Uncomplicated    Clinical Decision Making Low    Rehab Potential Good    PT Frequency 2x / week    PT Duration 8 weeks    PT Treatment/Interventions ADLs/Self Care Home Management;Cryotherapy;Electrical Stimulation;Ultrasound;Gait training;Neuromuscular re-education;Balance training;Therapeutic exercise;Therapeutic activities;Stair training;Functional mobility training;Patient/family education;Manual techniques;Vasopneumatic Device    PT Next Visit Plan slowly get her moving, needs ankle stability    Consulted and Agree with Plan of Care Patient           Patient will benefit from skilled therapeutic intervention in order to improve the following deficits and impairments:  Abnormal gait,Decreased range of motion,Difficulty walking,Decreased activity tolerance,Pain,Impaired flexibility,Decreased balance,Decreased mobility,Decreased strength,Increased edema  Visit Diagnosis: Pain in right ankle and joints of right foot - Plan: PT plan of care cert/re-cert  Difficulty in walking, not elsewhere classified - Plan: PT plan of care cert/re-cert  Localized edema - Plan: PT plan of care cert/re-cert     Problem List Patient Active Problem List   Diagnosis Date Noted   At risk for obstructive sleep apnea 06/11/2019   Cough 06/11/2019   Multifocal pneumonia    Acute respiratory failure with hypoxia ([facility]) 05/26/2019   Hyperglycemia 05/26/2019   Acute respiratory failure with hypoxemia ([facility]) 05/23/2019   Acute hypoxemic respiratory failure ([facility])  05/20/2019   Fibromyalgia 03/09/2019   Neck pain, chronic 03/09/2019   Acute shoulder bursitis, right 02/03/2019   Bile salt-induced diarrhea 09/25/2018   Arthritis of left acromioclavicular joint 09/23/2018   Precordial chest pain 08/11/2018   Morbid obesity ([facility]) 10/31/2017   S/P right UKR, lateral 10/30/2017   Incarcerated epigastric hernia 05/30/2015   Left foot pain 08/09/2014   Chronic diarrhea 07/27/2014   Gout 08/10/2012   Acid reflux 01/22/2012   Down syndrome 11/11/2011   Hernia of abdominal wall 10/21/2011   Psoriasis 10/21/2011   Type 2 diabetes mellitus, uncontrolled ([facility]) 04/12/2011   Hypothyroid 04/12/2011   Gait abnormality 06/22/2010   Tibial tendinitis, posterior 06/22/2010    Sumner Boast., PT 02/01/2020, 5:44 PM  [facility] [place], [place], [address] Phone: [phone]   Fax:  [phone]  Name: Wanda Oneill MRN: [id number] Date of Birth: [birth year]

## 2020-02-11 ENCOUNTER — Ambulatory Visit: Payer: Medicare HMO | Attending: Family Medicine | Admitting: Physical Therapy

## 2020-02-11 ENCOUNTER — Other Ambulatory Visit: Payer: Self-pay

## 2020-02-11 DIAGNOSIS — R600 Localized edema: Secondary | ICD-10-CM | POA: Insufficient documentation

## 2020-02-11 DIAGNOSIS — M25571 Pain in right ankle and joints of right foot: Secondary | ICD-10-CM | POA: Insufficient documentation

## 2020-02-11 DIAGNOSIS — R262 Difficulty in walking, not elsewhere classified: Secondary | ICD-10-CM | POA: Diagnosis not present

## 2020-02-11 NOTE — Therapy (Signed)
[facility] [address]. [place], [place], [address] Phone: [phone]   Fax:  [phone]  Physical Therapy Treatment  Patient Details  Name: Wanda Oneill MRN: [id number] Date of Birth: [birth year] Referring Provider (PT): J. Copland   Encounter Date: 02/11/2020   PT End of Session - 02/11/20 1051    Visit Number 2    Number of Visits 12    Date for PT Re-Evaluation 04/03/20    PT Start Time 1010    PT Stop Time 1105    PT Time Calculation (min) 55 min           Past Medical History:  Diagnosis Date  . Diabetes mellitus    type II  . Down syndrome   . Family history of adverse reaction to anesthesia    mom has had n/v  . Gastritis   . GERD (gastroesophageal reflux disease)   . Headache   . Hepatic steatosis   . Hidradenitis   . Hypothyroidism   . Irritable bowel syndrome   . Periumbilical hernia   . Pneumonia   . Restless legs   . Tendonitis    chronic in left foot  . Thyroid disease    hypothyroidism    Past Surgical History:  Procedure Laterality Date  . AXILLARY HIDRADENITIS EXCISION Bilateral   . CHOLECYSTECTOMY    . HERNIA REPAIR     multiple  . INCISIONAL HERNIA REPAIR  05/30/2015   LAPROSCOPIC  . INCISIONAL HERNIA REPAIR N/A 05/30/2015   Procedure: LAPAROSCOPIC INCISIONAL HERNIA;  Surgeon: Emelia Loron, MD;  Location: [facility] OR;  Service: General;  Laterality: N/A;  . INSERTION OF MESH N/A 05/30/2015   Procedure: INSERTION OF MESH;  Surgeon: Emelia Loron, MD;  Location: [facility] OR;  Service: General;  Laterality: N/A;  . KNEE ARTHROSCOPY     right knee  . LAPAROSCOPY N/A 05/30/2015   Procedure: LAPAROSCOPY DIAGNOSTIC;  Surgeon: Emelia Loron, MD;  Location: [facility] OR;  Service: General;  Laterality: N/A;  . PARTIAL KNEE ARTHROPLASTY Right 10/30/2017   Procedure: UNICOMPARTMENTAL RIGHT KNEE LATERAL;  Surgeon: Durene Romans, MD;  Location: [facility] ORS;  Service: Orthopedics;  Laterality: Right;  90  mins  . TONSILLECTOMY     adenoids    There were no vitals filed for this visit.   Subjective Assessment - 02/11/20 1010    Subjective RT ankle 7/10 but biggest issue is Left knee 9/10-seeing MD Monday ( RT knee is Partial Knee Replacement). Headed out of country 1/17    Currently in Pain? Yes    Pain Score 7     Pain Location Ankle    Pain Orientation Right                             [facility] Adult PT Treatment/Exercise - 02/11/20 0001      Exercises   Exercises Knee/Hip;Ankle      Modalities   Modalities Moist Heat;Vasopneumatic;Electrical Stimulation      Moist Heat Therapy   Number Minutes Moist Heat 15 Minutes    Moist Heat Location Knee      Electrical Stimulation   Electrical Stimulation Location RT ankle    Electrical Stimulation Action IFC    Electrical Stimulation Parameters supine with elevation    Electrical Stimulation Goals Pain      Vasopneumatic   Number Minutes Vasopneumatic  15 minutes    Vasopnuematic Location  Ankle  Vasopneumatic Pressure Low    Vasopneumatic Temperature  33      Manual Therapy   Manual Therapy Passive ROM    Passive ROM Rt ankle      Ankle Exercises: Aerobic   Nustep L 2 6 min      Ankle Exercises: Standing   Other Standing Ankle Exercises airex heel raises, toe raises, mini squats and marching 10 each with HHA    Other Standing Ankle Exercises black bar heel raises/toe raises 10 each   rocker board 10 x each way     Ankle Exercises: Seated   Other Seated Ankle Exercises dynadisc 15 x 15 directions      Ankle Exercises: Supine   T-Band red 10 x each way                    PT Short Term Goals - 02/01/20 1650      PT SHORT TERM GOAL #1   Title independent with initial HEP    Time 2    Period Weeks    Status New             PT Long Term Goals - 02/01/20 1650      PT LONG TERM GOAL #1   Title understand RICE    Time 8    Period Weeks    Status New      PT LONG TERM GOAL #2    Title dcrease pain 50%    Time 8    Period Weeks    Status New      PT LONG TERM GOAL #3   Title increase right ankle eversion to 10 degrees    Time 8    Period Weeks    Status New      PT LONG TERM GOAL #4   Title walk on all surfaces without limp and with minimal; pain    Time 8    Period Weeks    Status New                 Plan - 02/11/20 1051    Clinical Impression Statement toleated ther ex progression well with cuing. good ankle PROM but painful and limited with active. Left knee hyperextends with ex and gait.    PT Treatment/Interventions ADLs/Self Care Home Management;Cryotherapy;Electrical Stimulation;Ultrasound;Gait training;Neuromuscular re-education;Balance training;Therapeutic exercise;Therapeutic activities;Stair training;Functional mobility training;Patient/family education;Manual techniques;Vasopneumatic Device    PT Next Visit Plan assess how MD visit went for knee           Patient will benefit from skilled therapeutic intervention in order to improve the following deficits and impairments:  Abnormal gait,Decreased range of motion,Difficulty walking,Decreased activity tolerance,Pain,Impaired flexibility,Decreased balance,Decreased mobility,Decreased strength,Increased edema  Visit Diagnosis: Pain in right ankle and joints of right foot  Difficulty in walking, not elsewhere classified  Localized edema     Problem List Patient Active Problem List   Diagnosis Date Noted  . At risk for obstructive sleep apnea 06/11/2019  . Cough 06/11/2019  . Multifocal pneumonia   . Acute respiratory failure with hypoxia ([place]) 05/26/2019  . Hyperglycemia 05/26/2019  . Acute respiratory failure with hypoxemia ([place]) 05/23/2019  . Acute hypoxemic respiratory failure ([place]) 05/20/2019  . Fibromyalgia 03/09/2019  . Neck pain, chronic 03/09/2019  . Acute shoulder bursitis, right 02/03/2019  . Bile salt-induced diarrhea 09/25/2018  . Arthritis of left  acromioclavicular joint 09/23/2018  . Precordial chest pain 08/11/2018  . Morbid obesity ([place]) 10/31/2017  . S/P right UKR, lateral 10/30/2017  .  Incarcerated epigastric hernia 05/30/2015  . Left foot pain 08/09/2014  . Chronic diarrhea 07/27/2014  . Gout 08/10/2012  . Acid reflux 01/22/2012  . Down syndrome 11/11/2011  . Hernia of abdominal wall 10/21/2011  . Psoriasis 10/21/2011  . Type 2 diabetes mellitus, uncontrolled ([place]) 04/12/2011  . Hypothyroid 04/12/2011  . Gait abnormality 06/22/2010  . Tibial tendinitis, posterior 06/22/2010    Iven Earnhart,ANGIE PTA 02/11/2020, 10:54 AM  [place]. [place], [place], [address] Phone: [phone]   Fax:  [phone]  Name: Wanda Oneill MRN: [id number] Date of Birth: [birth year]

## 2020-02-13 DIAGNOSIS — Z1152 Encounter for screening for COVID-19: Secondary | ICD-10-CM | POA: Diagnosis not present

## 2020-02-18 ENCOUNTER — Ambulatory Visit: Payer: Medicare HMO | Admitting: Physical Therapy

## 2020-02-18 ENCOUNTER — Other Ambulatory Visit: Payer: Self-pay

## 2020-02-18 DIAGNOSIS — Z20822 Contact with and (suspected) exposure to covid-19: Secondary | ICD-10-CM | POA: Diagnosis not present

## 2020-02-18 DIAGNOSIS — R600 Localized edema: Secondary | ICD-10-CM

## 2020-02-18 DIAGNOSIS — M25571 Pain in right ankle and joints of right foot: Secondary | ICD-10-CM | POA: Diagnosis not present

## 2020-02-18 DIAGNOSIS — R262 Difficulty in walking, not elsewhere classified: Secondary | ICD-10-CM | POA: Diagnosis not present

## 2020-02-18 NOTE — Therapy (Signed)
Wanda Oneill. [place], [place], [address] Phone: [phone]   Fax:  [phone]  Physical Therapy Treatment  Patient Details  Name: Wanda Oneill MRN: [id number] Date of Birth: October 01, 1980 Referring Provider (PT): J. Copland   Encounter Date: 02/18/2020   PT End of Session - 02/18/20 1042    Visit Number 3    Number of Visits 12    Date for PT Re-Evaluation 04/03/20    PT Start Time 1010    PT Stop Time 1105    PT Time Calculation (min) 55 min           Past Medical History:  Diagnosis Date  . Diabetes mellitus    type II  . Down syndrome   . Family history of adverse reaction to anesthesia    mom has had n/v  . Gastritis   . GERD (gastroesophageal reflux disease)   . Headache   . Hepatic steatosis   . Hidradenitis   . Hypothyroidism   . Irritable bowel syndrome   . Periumbilical hernia   . Pneumonia   . Restless legs   . Tendonitis    chronic in left foot  . Thyroid disease    hypothyroidism    Past Surgical History:  Procedure Laterality Date  . AXILLARY HIDRADENITIS EXCISION Bilateral   . CHOLECYSTECTOMY    . HERNIA REPAIR     multiple  . INCISIONAL HERNIA REPAIR  05/30/2015   LAPROSCOPIC  . INCISIONAL HERNIA REPAIR N/A 05/30/2015   Procedure: LAPAROSCOPIC INCISIONAL HERNIA;  Surgeon: Rolm Bookbinder, MD;  Location: [facility];  Service: General;  Laterality: N/A;  . INSERTION OF MESH N/A 05/30/2015   Procedure: INSERTION OF MESH;  Surgeon: Rolm Bookbinder, MD;  Location: [facility];  Service: General;  Laterality: N/A;  . KNEE ARTHROSCOPY     right knee  . LAPAROSCOPY N/A 05/30/2015   Procedure: LAPAROSCOPY DIAGNOSTIC;  Surgeon: Rolm Bookbinder, MD;  Location: [facility];  Service: General;  Laterality: N/A;  . PARTIAL KNEE ARTHROPLASTY Right 10/30/2017   Procedure: UNICOMPARTMENTAL RIGHT KNEE LATERAL;  Surgeon: Paralee Cancel, MD;  Location: [facility];  Service: Orthopedics;  Laterality: Right;  90  mins  . TONSILLECTOMY     adenoids    There were no vitals filed for this visit.   Subjective Assessment - 02/18/20 1008    Subjective pt stated about the same. didnt make it to MD for knee because I was sick - but not covid. Leaving country Monday    Currently in Pain? Yes    Pain Score 5     Pain Location Ankle    Pain Orientation Right                             [facility] Adult PT Treatment/Exercise - 02/18/20 0001      High Level Balance   High Level Balance Activities Side stepping;Tandem walking   on foam mat   High Level Balance Comments toe raises and heel raises on foam mat      Modalities   Modalities Moist Heat;Vasopneumatic;Electrical Stimulation      Moist Heat Therapy   Number Minutes Moist Heat 15 Minutes    Moist Heat Location Knee      Electrical Stimulation   Electrical Stimulation Location RT ankle    Electrical Stimulation Action IFC    Electrical Stimulation Parameters supine with elevation    Electrical Stimulation  Goals Pain;Edema      Vasopneumatic   Number Minutes Vasopneumatic  15 minutes    Vasopnuematic Location  Ankle    Vasopneumatic Pressure Low    Vasopneumatic Temperature  33      Ankle Exercises: Aerobic   Nustep L 4 6 min      Ankle Exercises: Standing   Other Standing Ankle Exercises airex heel raises, toe raises, mini squats and marching 10 each with HHA    Other Standing Ankle Exercises black bar heel raises/toe raises 10 each   dyna disc 15 x 4 way     Ankle Exercises: Supine   T-Band red 15 x each way                    PT Short Term Goals - 02/01/20 1650      PT SHORT TERM GOAL #1   Title independent with initial HEP    Time 2    Period Weeks    Status New             PT Long Term Goals - 02/01/20 1650      PT LONG TERM GOAL #1   Title understand RICE    Time 8    Period Weeks    Status New      PT LONG TERM GOAL #2   Title dcrease pain 50%    Time 8    Period Weeks     Status New      PT LONG TERM GOAL #3   Title increase right ankle eversion to 10 degrees    Time 8    Period Weeks    Status New      PT LONG TERM GOAL #4   Title walk on all surfaces without limp and with minimal; pain    Time 8    Period Weeks    Status New                 Plan - 02/18/20 1042    Clinical Impression Statement progressed ther ex and pt tolerated well. decreased antalgic gait. pt leaving for [place] monday so discussed wearing good shoes and resting as needed with elevation as needed    PT Treatment/Interventions ADLs/Self Care Home Management;Cryotherapy;Electrical Stimulation;Ultrasound;Gait training;Neuromuscular re-education;Balance training;Therapeutic exercise;Therapeutic activities;Stair training;Functional mobility training;Patient/family education;Manual techniques;Vasopneumatic Device    PT Next Visit Plan assses how feels after trip           Patient will benefit from skilled therapeutic intervention in order to improve the following deficits and impairments:  Abnormal gait,Decreased range of motion,Difficulty walking,Decreased activity tolerance,Pain,Impaired flexibility,Decreased balance,Decreased mobility,Decreased strength,Increased edema  Visit Diagnosis: Pain in right ankle and joints of right foot  Difficulty in walking, not elsewhere classified  Localized edema     Problem List Patient Active Problem List   Diagnosis Date Noted  . At risk for obstructive sleep apnea 06/11/2019  . Cough 06/11/2019  . Multifocal pneumonia   . Acute respiratory failure with hypoxia ([place]) 05/26/2019  . Hyperglycemia 05/26/2019  . Acute respiratory failure with hypoxemia ([place]) 05/23/2019  . Acute hypoxemic respiratory failure ([place]) 05/20/2019  . Fibromyalgia 03/09/2019  . Neck pain, chronic 03/09/2019  . Acute shoulder bursitis, right 02/03/2019  . Bile salt-induced diarrhea 09/25/2018  . Arthritis of left acromioclavicular joint 09/23/2018  .  Precordial chest pain 08/11/2018  . Morbid obesity ([place]) 10/31/2017  . S/P right UKR, lateral 10/30/2017  . Incarcerated epigastric hernia 05/30/2015  . Left foot  pain 08/09/2014  . Chronic diarrhea 07/27/2014  . Gout 08/10/2012  . Acid reflux 01/22/2012  . Down syndrome 11/11/2011  . Hernia of abdominal wall 10/21/2011  . Psoriasis 10/21/2011  . Type 2 diabetes mellitus, uncontrolled ([place]) 04/12/2011  . Hypothyroid 04/12/2011  . Gait abnormality 06/22/2010  . Tibial tendinitis, posterior 06/22/2010    PAYSEUR,ANGIE PTA 02/18/2020, 10:45 AM  [place]. [place], [place], [address] Phone: [phone]   Fax:  [phone]  Name: RICHARDEAN AYDIN MRN: [id number] Date of Birth: 1980-12-25

## 2020-03-07 DIAGNOSIS — J189 Pneumonia, unspecified organism: Secondary | ICD-10-CM

## 2020-03-07 HISTORY — DX: Pneumonia, unspecified organism: J18.9

## 2020-03-13 DIAGNOSIS — E010 Iodine-deficiency related diffuse (endemic) goiter: Secondary | ICD-10-CM | POA: Diagnosis not present

## 2020-03-13 DIAGNOSIS — E1165 Type 2 diabetes mellitus with hyperglycemia: Secondary | ICD-10-CM | POA: Diagnosis not present

## 2020-03-13 DIAGNOSIS — E781 Pure hyperglyceridemia: Secondary | ICD-10-CM | POA: Diagnosis not present

## 2020-03-13 DIAGNOSIS — E039 Hypothyroidism, unspecified: Secondary | ICD-10-CM | POA: Diagnosis not present

## 2020-03-17 DIAGNOSIS — M1712 Unilateral primary osteoarthritis, left knee: Secondary | ICD-10-CM | POA: Diagnosis not present

## 2020-03-21 ENCOUNTER — Other Ambulatory Visit: Payer: Self-pay | Admitting: Family Medicine

## 2020-03-21 MED ORDER — DOXYCYCLINE HYCLATE 100 MG PO CAPS: 100.0000 mg | ORAL_CAPSULE | Freq: Two times a day (BID) | ORAL | 0 refills | Status: DC

## 2020-03-21 NOTE — Progress Notes (Signed)
Called and spoke with her dad Dr Ruben Reason,  No fever or systemic symptoms.  Will call in doxycycline, if needs I and D I will work her in tomorrow   Meds ordered this encounter  Medications  . doxycycline (VIBRAMYCIN) 100 MG capsule    Sig: Take 1 capsule (100 mg total) by mouth 2 (two) times daily.    Dispense:  20 capsule    Refill:  0

## 2020-04-06 DIAGNOSIS — M25562 Pain in left knee: Secondary | ICD-10-CM | POA: Diagnosis not present

## 2020-04-19 DIAGNOSIS — Z96651 Presence of right artificial knee joint: Secondary | ICD-10-CM | POA: Diagnosis not present

## 2020-04-19 DIAGNOSIS — M1712 Unilateral primary osteoarthritis, left knee: Secondary | ICD-10-CM | POA: Diagnosis not present

## 2020-05-01 NOTE — Progress Notes (Addendum)
[facility] at [facility] [address] [place], [place] [address] [phone]  Date:  05/03/2020   Name:  Wanda Oneill   DOB:  Jan 12, 1981   MRN:  [id number]  PCP:  Darreld Mclean, MD    Chief Complaint: Pre-op Exam (4/26, knee surgery)   History of Present Illness:  Wanda Oneill is a 40 y.o. very pleasant female patient who presents with the following:  Here today for a pre-operative evaluation She plans to have a knee partial replacement in the next few weeks per Dr Alvan Dame- planned date 4/26  Last seen by myself in December - history of diabetes, hypothyroidism, obesity, Down syndrome and several associated MSK problems Dr Chalmers Cater manages her diabetes and thyroid   Perline is bothered by knee pain, otherwise she is generally doing well.  She is able to do physical activities such as vacuuming her home without any chest pain or shortness of breath She does also suffer from chronic Candida intertrigo.  This is most pronounced under her breasts and belly.  She has tried several different home remedies and also an antifungal and antiitch powder, unfortunately have not been helpful  Patient Active Problem List   Diagnosis Date Noted  . At risk for obstructive sleep apnea 06/11/2019  . Cough 06/11/2019  . Multifocal pneumonia   . Acute respiratory failure with hypoxia ([place]) 05/26/2019  . Hyperglycemia 05/26/2019  . Acute respiratory failure with hypoxemia ([place]) 05/23/2019  . Acute hypoxemic respiratory failure ([place]) 05/20/2019  . Fibromyalgia 03/09/2019  . Neck pain, chronic 03/09/2019  . Acute shoulder bursitis, right 02/03/2019  . Bile salt-induced diarrhea 09/25/2018  . Arthritis of left acromioclavicular joint 09/23/2018  . Precordial chest pain 08/11/2018  . Morbid obesity ([place]) 10/31/2017  . S/P right UKR, lateral 10/30/2017  . Incarcerated epigastric hernia 05/30/2015  . Left foot pain 08/09/2014  . Chronic diarrhea  07/27/2014  . Gout 08/10/2012  . Acid reflux 01/22/2012  . Down syndrome 11/11/2011  . Hernia of abdominal wall 10/21/2011  . Psoriasis 10/21/2011  . Type 2 diabetes mellitus, uncontrolled ([place]) 04/12/2011  . Hypothyroid 04/12/2011  . Gait abnormality 06/22/2010  . Tibial tendinitis, posterior 06/22/2010    Past Medical History:  Diagnosis Date  . Diabetes mellitus    type II  . Down syndrome   . Family history of adverse reaction to anesthesia    mom has had n/v  . Gastritis   . GERD (gastroesophageal reflux disease)   . Headache   . Hepatic steatosis   . Hidradenitis   . Hypothyroidism   . Irritable bowel syndrome   . Periumbilical hernia   . Pneumonia   . Restless legs   . Tendonitis    chronic in left foot  . Thyroid disease    hypothyroidism    Past Surgical History:  Procedure Laterality Date  . AXILLARY HIDRADENITIS EXCISION Bilateral   . CHOLECYSTECTOMY    . HERNIA REPAIR     multiple  . INCISIONAL HERNIA REPAIR  05/30/2015   LAPROSCOPIC  . INCISIONAL HERNIA REPAIR N/A 05/30/2015   Procedure: LAPAROSCOPIC INCISIONAL HERNIA;  Surgeon: Rolm Bookbinder, MD;  Location: [facility];  Service: General;  Laterality: N/A;  . INSERTION OF MESH N/A 05/30/2015   Procedure: INSERTION OF MESH;  Surgeon: Rolm Bookbinder, MD;  Location: [place] [facility];  Service: General;  Laterality: N/A;  . KNEE ARTHROSCOPY     right knee  . LAPAROSCOPY N/A  05/30/2015   Procedure: LAPAROSCOPY DIAGNOSTIC;  Surgeon: Rolm Bookbinder, MD;  Location: [facility];  Service: General;  Laterality: N/A;  . PARTIAL KNEE ARTHROPLASTY Right 10/30/2017   Procedure: UNICOMPARTMENTAL RIGHT KNEE LATERAL;  Surgeon: Paralee Cancel, MD;  Location: [facility];  Service: Orthopedics;  Laterality: Right;  90 mins  . TONSILLECTOMY     adenoids    Social History   Tobacco Use  . Smoking status: Never Smoker  . Smokeless tobacco: Never Used  Vaping Use  . Vaping Use: Never used  Substance Use Topics  . Alcohol use: No     Alcohol/week: 0.0 standard drinks  . Drug use: No    Family History  Problem Relation Age of Onset  . Thyroid cancer Mother   . Diabetes type II Mother   . High Cholesterol Mother   . Heart disease Mother   . Diabetes type II Father   . Hypertension Father   . Stroke Father     Allergies  Allergen Reactions  . Vancomycin Itching  . Cefuroxime Axetil Hives  . Sulfa Antibiotics Hives    Medication list has been reviewed and updated.  Current Outpatient Medications on File Prior to Visit  Medication Sig Dispense Refill  . acetaminophen (TYLENOL) 325 MG tablet Take 650 mg by mouth every 6 (six) hours as needed for mild pain.     Marland Kitchen allopurinol (ZYLOPRIM) 300 MG tablet Take 1 tablet by mouth once daily 90 tablet 3  . Blood Glucose Monitoring Suppl (BLOOD GLUCOSE METER KIT AND SUPPLIES) Dispense based on patient and insurance preference. To check blood sugars daily FOR ICD-9 250.00 (Patient taking differently: 1 each by Other route See admin instructions. Dispense based on patient and insurance preference. To check blood sugars daily FOR ICD-9 250.00) 1 each 0  . Blood Glucose Monitoring Suppl (TRUE METRIX METER) w/Device KIT Use as directed to check glucose up to two times daily. Dx Code E11.9 1 kit 0  . cetirizine (ZYRTEC) 10 MG tablet Take 10 mg by mouth daily as needed for allergies.     . cholestyramine (QUESTRAN) 4 g packet DISSOLVE & TAKE 1 PACKET OF POWDER BY MOUTH TWICE DAILY 180 each 3  . clobetasol cream (TEMOVATE) 1.44 % Apply 1 application topically daily as needed (for eczema).     . empagliflozin (JARDIANCE) 25 MG TABS tablet Take 25 mg by mouth daily.    . Glucose Blood (BLOOD GLUCOSE TEST STRIPS) STRP 1 each by Other route See admin instructions. Use to test blood sugar up to twice a day 100 strip 3  . glucose blood (FREESTYLE TEST STRIPS) test strip Use as directed to test blood sugar up to twice a day. Dx code E11.9 100 each 12  . glucose blood (TRUE METRIX BLOOD  GLUCOSE TEST) test strip Use as instructed to check glucose up to 2 times daily. Dx Code E11.9 100 each 12  . levonorgestrel-ethinyl estradiol (JOLESSA) 0.15-0.03 MG tablet Take 1 tablet by mouth daily. 84 tablet 3  . levothyroxine (EUTHYROX) 137 MCG tablet Take 1 tablet (137 mcg total) by mouth daily before breakfast. 90 tablet 3  . metFORMIN (GLUCOPHAGE) 1000 MG tablet Take 1,000 mg by mouth 2 (two) times daily.   2  . naproxen sodium (ALEVE) 220 MG tablet Take 220 mg by mouth daily as needed (pain).     . ondansetron (ZOFRAN) 4 MG tablet Take 4 mg by mouth every 8 (eight) hours as needed for nausea/vomiting.     Marland Kitchen  ONE TOUCH ULTRA TEST test strip USE TO CHECK BLOOD SUGAR ONCE DAILY (ICD CODE:25.00) (Patient taking differently: 1 each by Other route daily.) 100 each 3  . Semaglutide,0.25 or 0.5MG/DOS, 2 MG/1.5ML SOPN Inject 0.5 mg into the skin once a week. Saturdays    . TRUEplus Lancets 30G MISC Use as directed to check glucose up to two times daily. Dx code E11.9 100 each 3   No current facility-administered medications on file prior to visit.    Review of Systems:  As per HPI- otherwise negative.  Physical Examination: Vitals:   05/03/20 1526  BP: 122/72  Pulse: 99  Resp: 19  Temp: 97.8 F (36.6 C)  SpO2: 99%   Vitals:   05/03/20 1526  Weight: 237 lb (107.5 kg)  Height: _0  (1.626 m)   Body mass index is 40.68 kg/m. Ideal Body Weight: Weight in (lb) to have BMI = 25: 145.3  GEN: no acute distress.  Obese, looks well HEENT: Atraumatic, Normocephalic.  Ears and Nose: No external deformity. CV: RRR, No M/G/R. No JVD. No thrill. No extra heart sounds. PULM: CTA B, no wheezes, crackles, rhonchi. No retractions. No resp. distress. No accessory muscle use. ABD: S, NT, ND, +BS. No rebound. No HSM. EXTR: No c/c/e PSYCH: Normally interactive. Conversant.  Candida intertrigo is present under both breasts and under abdominal skin folds  EKG: Per computer read, low voltage in  precordial leads and right atrial enlargement.  Compared readings with previous EKG tracing April 2021, no significant change noted Of note, she also had an echocardiogram about 1 year ago which was reassuring  Assessment and Plan: Pre-operative cardiovascular examination - Plan: EKG 87-OMVE, Basic metabolic panel, CBC, Hemoglobin A1c, POCT urinalysis dipstick, Albumin, Protime-INR, CANCELED: Protime-INR  Cough - Plan: benzonatate (TESSALON) 100 MG capsule  Candidal intertrigo - Plan: fluconazole (DIFLUCAN) 150 MG tablet  [place] is a delightful woman with Down syndrome, here today for preoperative evaluation.  She plans a partial knee replacement per orthopedics next month. EKG is reassuring, she is able to perform 4 METS of cardiovascular activity.  Assuming labs are normal she should be cleared for surgery  We discussed her can intertrigo.  This has been a chronic concern for her.  I encouraged her to keep the area is warm and dry as possible, discussed some strategies to accomplish this.  We will have her try taking Diflucan 150 every 72 hours for 3 doses, then can take once weekly if needed  They will keep you posted on her progress Results for orders placed or performed in visit on 05/03/20  POCT urinalysis dipstick  Result Value Ref Range   Color, UA yellow yellow   Clarity, UA clear clear   Glucose, UA >=1,000 (A) negative mg/dL   Bilirubin, UA negative negative   Ketones, POC UA negative negative mg/dL   Spec Grav, UA 1.015 1.010 - 1.025   Blood, UA negative negative   pH, UA 5.0 5.0 - 8.0   Protein Ur, POC negative negative mg/dL   Urobilinogen, UA 0.2 0.2 or 1.0 E.U./dL   Nitrite, UA Negative Negative   Leukocytes, UA Negative Negative   Glycosuria is expected as patient is taking an SGLT2  This visit occurred during the SARS-CoV-2 public health emergency.  Safety protocols were in place, including screening questions prior to the visit, additional usage of staff PPE, and  extensive cleaning of exam room while observing appropriate contact time as indicated for disinfecting solutions.    Signed Janett Billow  Abigayl Hor, MD  Addendum 3/31, received labs as below Called and spoke with her mother Learta Codding, labs are okay but unfortunately Cesia's A1c is over the preferred cutoff for surgical procedures.  This may cause a delay of her surgery.  They state understanding and will follow up with her endocrinologist ASAP  I will send a copy of her labs in the mail  Results for orders placed or performed in visit on 19/59/74  Basic metabolic panel  Result Value Ref Range   Sodium 137 135 - 145 mEq/L   Potassium 4.6 3.5 - 5.1 mEq/L   Chloride 97 96 - 112 mEq/L   CO2 27 19 - 32 mEq/L   Glucose, Bld 259 (H) 70 - 99 mg/dL   BUN 16 6 - 23 mg/dL   Creatinine, Ser 0.73 0.40 - 1.20 mg/dL   GFR 102.90 >60.00 mL/min   Calcium 9.0 8.4 - 10.5 mg/dL  CBC  Result Value Ref Range   WBC 6.7 4.0 - 10.5 K/uL   RBC 4.75 3.87 - 5.11 Mil/uL   Platelets 277.0 150.0 - 400.0 K/uL   Hemoglobin 13.8 12.0 - 15.0 g/dL   HCT 42.9 36.0 - 46.0 %   MCV 90.3 78.0 - 100.0 fl   MCHC 32.2 30.0 - 36.0 g/dL   RDW 16.5 (H) 11.5 - 15.5 %  Hemoglobin A1c  Result Value Ref Range   Hgb A1c MFr Bld 8.5 (H) 4.6 - 6.5 %  Albumin  Result Value Ref Range   Albumin 3.7 3.5 - 5.2 g/dL  POCT urinalysis dipstick  Result Value Ref Range   Color, UA yellow yellow   Clarity, UA clear clear   Glucose, UA >=1,000 (A) negative mg/dL   Bilirubin, UA negative negative   Ketones, POC UA negative negative mg/dL   Spec Grav, UA 1.015 1.010 - 1.025   Blood, UA negative negative   pH, UA 5.0 5.0 - 8.0   Protein Ur, POC negative negative mg/dL   Urobilinogen, UA 0.2 0.2 or 1.0 E.U./dL   Nitrite, UA Negative Negative   Leukocytes, UA Negative Negative

## 2020-05-03 ENCOUNTER — Ambulatory Visit (INDEPENDENT_AMBULATORY_CARE_PROVIDER_SITE_OTHER): Payer: Medicare HMO | Admitting: Family Medicine

## 2020-05-03 ENCOUNTER — Other Ambulatory Visit: Payer: Self-pay

## 2020-05-03 ENCOUNTER — Encounter: Payer: Self-pay | Admitting: Family Medicine

## 2020-05-03 VITALS — BP 122/72 | HR 99 | Temp 97.8°F | Resp 19 | Ht 64.0 in | Wt 237.0 lb

## 2020-05-03 DIAGNOSIS — B372 Candidiasis of skin and nail: Secondary | ICD-10-CM

## 2020-05-03 DIAGNOSIS — Z01810 Encounter for preprocedural cardiovascular examination: Secondary | ICD-10-CM | POA: Diagnosis not present

## 2020-05-03 DIAGNOSIS — R059 Cough, unspecified: Secondary | ICD-10-CM

## 2020-05-03 LAB — POCT URINALYSIS DIP (MANUAL ENTRY)
Bilirubin, UA: NEGATIVE
Blood, UA: NEGATIVE
Glucose, UA: >=1000 mg/dL — AB
Ketones, POC UA: NEGATIVE mg/dL
Leukocytes, UA: NEGATIVE
Nitrite, UA: NEGATIVE
Protein Ur, POC: NEGATIVE mg/dL
Spec Grav, UA: 1.015 (ref 1.010–1.025)
Urobilinogen, UA: 0.2 E.U./dL
pH, UA: 5 (ref 5.0–8.0)

## 2020-05-03 MED ORDER — FLUCONAZOLE 150 MG PO TABS: 150.0000 mg | ORAL_TABLET | Freq: Once | ORAL | 0 refills | Status: AC

## 2020-05-03 MED ORDER — BENZONATATE 100 MG PO CAPS: 100.0000 mg | ORAL_CAPSULE | Freq: Three times a day (TID) | ORAL | 2 refills | Status: AC | PRN

## 2020-05-03 NOTE — Patient Instructions (Signed)
It was good to see you again today!  I will be in touch with your labs and will get your pre-op form sent to Dr Alvan Dame for you  For yeast infection- try to get the areas under your breasts and tummy really dry after bathing- a fan or hair dryer on cool can help.  Try to keep these areas as cool and dry as you can You might try an anti-moisture powder such as baby powder or talcum powder  Use the diflucan every 3-7 days as needed for yeast (can take every 3 days for a total of 3 doses only, then space it out)   Please let me know how this works for you

## 2020-05-04 ENCOUNTER — Telehealth: Payer: Self-pay

## 2020-05-04 LAB — CBC
HCT: 42.9 % (ref 36.0–46.0)
Hemoglobin: 13.8 g/dL (ref 12.0–15.0)
MCHC: 32.2 g/dL (ref 30.0–36.0)
MCV: 90.3 fl (ref 78.0–100.0)
Platelets: 277 10*3/uL (ref 150.0–400.0)
RBC: 4.75 Mil/uL (ref 3.87–5.11)
RDW: 16.5 % — ABNORMAL HIGH (ref 11.5–15.5)
WBC: 6.7 10*3/uL (ref 4.0–10.5)

## 2020-05-04 LAB — BASIC METABOLIC PANEL
BUN: 16 mg/dL (ref 6–23)
CO2: 27 mEq/L (ref 19–32)
Calcium: 9 mg/dL (ref 8.4–10.5)
Chloride: 97 mEq/L (ref 96–112)
Creatinine, Ser: 0.73 mg/dL (ref 0.40–1.20)
GFR: 102.9 mL/min (ref >60.00)
Glucose, Bld: 259 mg/dL — ABNORMAL HIGH (ref 70–99)
Potassium: 4.6 mEq/L (ref 3.5–5.1)
Sodium: 137 mEq/L (ref 135–145)

## 2020-05-04 LAB — HEMOGLOBIN A1C: Hgb A1c MFr Bld: 8.5 % — ABNORMAL HIGH (ref 4.6–6.5)

## 2020-05-04 LAB — ALBUMIN: Albumin: 3.7 g/dL (ref 3.5–5.2)

## 2020-05-04 NOTE — Addendum Note (Signed)
Addended by: Manuela Schwartz on: 05/04/2020 09:47 AM   Modules accepted: Orders

## 2020-05-04 NOTE — Telephone Encounter (Signed)
Left message for Learta Codding the parent of patient to return call to lab.

## 2020-05-12 ENCOUNTER — Emergency Department (INDEPENDENT_AMBULATORY_CARE_PROVIDER_SITE_OTHER)
Admission: EM | Admit: 2020-05-12 | Discharge: 2020-05-12 | Disposition: A | Discharge status: Home / Self Care | Payer: Medicare HMO | Source: Home / Self Care

## 2020-05-12 ENCOUNTER — Other Ambulatory Visit: Payer: Self-pay

## 2020-05-12 ENCOUNTER — Emergency Department (INDEPENDENT_AMBULATORY_CARE_PROVIDER_SITE_OTHER): Payer: Medicare HMO

## 2020-05-12 DIAGNOSIS — R509 Fever, unspecified: Secondary | ICD-10-CM

## 2020-05-12 DIAGNOSIS — R059 Cough, unspecified: Secondary | ICD-10-CM | POA: Diagnosis not present

## 2020-05-12 DIAGNOSIS — J209 Acute bronchitis, unspecified: Secondary | ICD-10-CM | POA: Diagnosis not present

## 2020-05-12 DIAGNOSIS — R519 Headache, unspecified: Secondary | ICD-10-CM | POA: Diagnosis not present

## 2020-05-12 MED ORDER — DOXYCYCLINE HYCLATE 100 MG PO CAPS: 100.0000 mg | ORAL_CAPSULE | Freq: Two times a day (BID) | ORAL | 0 refills | Status: AC

## 2020-05-12 MED ORDER — BENZONATATE 100 MG PO CAPS: 100.0000 mg | ORAL_CAPSULE | Freq: Three times a day (TID) | ORAL | 0 refills | Status: DC | PRN

## 2020-05-12 MED ORDER — ALBUTEROL SULFATE HFA 108 (90 BASE) MCG/ACT IN AERS: 2.0000 | INHALATION_SPRAY | RESPIRATORY_TRACT | 0 refills | Status: DC | PRN

## 2020-05-12 NOTE — ED Provider Notes (Signed)
[facility]    CSN: [id number] Arrival date & time: 05/12/20  1902      History   Chief Complaint Chief Complaint  Patient presents with  . Cough  . Fever    HPI Wanda Oneill is a 40 y.o. female.   HPI  Patient with history recurrent pneumonia, in today for evaluation of possible pneumonia. Her father who is physician listened to her lungs was concerned for possible PNA. Aferbile. She has had cough intermittent since later part of March. She has used her mother's albuterol inhaler and achieved relief of shortness of breath and persistent cough.  Cough is occasionally productive. No known sick contacts. Past Medical History:  Diagnosis Date  . Diabetes mellitus    type II  . Down syndrome   . Family history of adverse reaction to anesthesia    mom has had n/v  . Gastritis   . GERD (gastroesophageal reflux disease)   . Headache   . Hepatic steatosis   . Hidradenitis   . Hypothyroidism   . Irritable bowel syndrome   . Periumbilical hernia   . Pneumonia   . Restless legs   . Tendonitis    chronic in left foot  . Thyroid disease    hypothyroidism    Patient Active Problem List   Diagnosis Date Noted  . At risk for obstructive sleep apnea 06/11/2019  . Cough 06/11/2019  . Multifocal pneumonia   . Acute respiratory failure with hypoxia ([place]) 05/26/2019  . Hyperglycemia 05/26/2019  . Acute respiratory failure with hypoxemia ([place]) 05/23/2019  . Acute hypoxemic respiratory failure ([place]) 05/20/2019  . Fibromyalgia 03/09/2019  . Neck pain, chronic 03/09/2019  . Acute shoulder bursitis, right 02/03/2019  . Bile salt-induced diarrhea 09/25/2018  . Arthritis of left acromioclavicular joint 09/23/2018  . Precordial chest pain 08/11/2018  . Morbid obesity ([place]) 10/31/2017  . S/P right UKR, lateral 10/30/2017  . Incarcerated epigastric hernia 05/30/2015  . Left foot pain 08/09/2014  . Chronic diarrhea 07/27/2014  . Gout 08/10/2012  . Acid reflux 01/22/2012   . Down syndrome 11/11/2011  . Hernia of abdominal wall 10/21/2011  . Psoriasis 10/21/2011  . Type 2 diabetes mellitus, uncontrolled ([place]) 04/12/2011  . Hypothyroid 04/12/2011  . Gait abnormality 06/22/2010  . Tibial tendinitis, posterior 06/22/2010    Past Surgical History:  Procedure Laterality Date  . AXILLARY HIDRADENITIS EXCISION Bilateral   . CHOLECYSTECTOMY    . HERNIA REPAIR     multiple  . INCISIONAL HERNIA REPAIR  05/30/2015   LAPROSCOPIC  . INCISIONAL HERNIA REPAIR N/A 05/30/2015   Procedure: LAPAROSCOPIC INCISIONAL HERNIA;  Surgeon: Rolm Bookbinder, MD;  Location: [facility];  Service: General;  Laterality: N/A;  . INSERTION OF MESH N/A 05/30/2015   Procedure: INSERTION OF MESH;  Surgeon: Rolm Bookbinder, MD;  Location: [facility];  Service: General;  Laterality: N/A;  . KNEE ARTHROSCOPY     right knee  . LAPAROSCOPY N/A 05/30/2015   Procedure: LAPAROSCOPY DIAGNOSTIC;  Surgeon: Rolm Bookbinder, MD;  Location: [facility];  Service: General;  Laterality: N/A;  . PARTIAL KNEE ARTHROPLASTY Right 10/30/2017   Procedure: UNICOMPARTMENTAL RIGHT KNEE LATERAL;  Surgeon: Paralee Cancel, MD;  [facility];  Service: Orthopedics;  Laterality: Right;  90 mins  . TONSILLECTOMY     adenoids    OB History   No obstetric history on file.      Home Medications    Prior to Admission medications   Medication Sig Start Date End  Date Taking? Authorizing Provider  albuterol (VENTOLIN HFA) 108 (90 Base) MCG/ACT inhaler Inhale 2 puffs into the lungs every 4 (four) hours as needed for wheezing or shortness of breath. 05/12/20  Yes Scot Jun, FNP  benzonatate (TESSALON) 100 MG capsule Take 1-2 capsules (100-200 mg total) by mouth 3 (three) times daily as needed for cough. 05/12/20  Yes Scot Jun, FNP  doxycycline (VIBRAMYCIN) 100 MG capsule Take 1 capsule (100 mg total) by mouth 2 (two) times daily for 7 days. 05/12/20 05/19/20 Yes Scot Jun, FNP  acetaminophen (TYLENOL) 325 MG  tablet Take 650 mg by mouth every 6 (six) hours as needed for mild pain.     [provider]  allopurinol (ZYLOPRIM) 300 MG tablet Take 1 tablet by mouth once daily Patient taking differently: Take 300 mg by mouth daily. 06/14/19   Copland, Gay Filler, MD  Blood Glucose Monitoring Suppl (BLOOD GLUCOSE METER KIT AND SUPPLIES) Dispense based on patient and insurance preference. To check blood sugars daily FOR ICD-9 250.00 Patient taking differently: 1 each by Other route See admin instructions. Dispense based on patient and insurance preference. To check blood sugars daily FOR ICD-9 250.00 08/31/13   Darlyne Russian, MD  Blood Glucose Monitoring Suppl (TRUE METRIX METER) w/Device KIT Use as directed to check glucose up to two times daily. Dx Code E11.9 08/16/19   Copland, Gay Filler, MD  cetirizine (ZYRTEC) 10 MG tablet Take 10 mg by mouth daily as needed for allergies.     [provider]  cholestyramine (QUESTRAN) 4 g packet DISSOLVE & TAKE 1 PACKET OF POWDER BY MOUTH TWICE DAILY Patient taking differently: Take 4 g by mouth 2 (two) times daily. 01/05/20   Copland, Gay Filler, MD  clobetasol cream (TEMOVATE) 2.53 % Apply 1 application topically daily as needed (for eczema).  10/07/12   [provider]  dicyclomine (BENTYL) 20 MG tablet Take 20 mg by mouth daily as needed for spasms.    [provider]  empagliflozin (JARDIANCE) 25 MG TABS tablet Take 25 mg by mouth daily.    [provider]  fluconazole (DIFLUCAN) 150 MG tablet Take 150 mg by mouth every 7 (seven) days. 05/03/20   [provider]  Glucose Blood (BLOOD GLUCOSE TEST STRIPS) STRP 1 each by Other route See admin instructions. Use to test blood sugar up to twice a day 08/13/19   Copland, Gay Filler, MD  glucose blood (FREESTYLE TEST STRIPS) test strip Use as directed to test blood sugar up to twice a day. Dx code E11.9 08/16/19   Copland, Gay Filler, MD  glucose blood (TRUE METRIX BLOOD GLUCOSE TEST)  test strip Use as instructed to check glucose up to 2 times daily. Dx Code E11.9 08/16/19   Copland, Gay Filler, MD  levonorgestrel-ethinyl estradiol (JOLESSA) 0.15-0.03 MG tablet Take 1 tablet by mouth daily. 11/03/19   Copland, Gay Filler, MD  levothyroxine (EUTHYROX) 137 MCG tablet Take 1 tablet (137 mcg total) by mouth daily before breakfast. 06/14/19   Copland, Gay Filler, MD  metFORMIN (GLUCOPHAGE) 1000 MG tablet Take 1,000 mg by mouth 2 (two) times daily.  01/16/16   [provider]  naproxen sodium (ALEVE) 220 MG tablet Take 220 mg by mouth daily as needed (pain).     [provider]  ondansetron (ZOFRAN) 4 MG tablet Take 4 mg by mouth every 8 (eight) hours as needed for nausea/vomiting.  05/20/19   [provider]  ONE TOUCH ULTRA TEST  test strip USE TO CHECK BLOOD SUGAR ONCE DAILY (ICD CODE:25.00) Patient taking differently: 1 each by Other route daily. 05/08/13   Gale Journey, Damaris Hippo, PA-C  Semaglutide,0.25 or 0.5MG/DOS, 2 MG/1.5ML SOPN Inject 0.5 mg into the skin every Saturday.    [provider]  TRUEplus Lancets 30G MISC Use as directed to check glucose up to two times daily. Dx code E11.9 08/16/19   Copland, Gay Filler, MD  TRULICITY 1.5 GH/8.2XH SOPN Inject 1.5 mg into the skin every Saturday. 04/10/20   [provider]  VASCEPA 1 g capsule Take 2 g by mouth 2 (two) times daily. 04/04/20   [provider]    Family History Family History  Problem Relation Age of Onset  . Thyroid cancer Mother   . Diabetes type II Mother   . High Cholesterol Mother   . Heart disease Mother   . Diabetes type II Father   . Hypertension Father   . Stroke Father     Social History Social History   Tobacco Use  . Smoking status: Never Smoker  . Smokeless tobacco: Never Used  Vaping Use  . Vaping Use: Never used  Substance Use Topics  . Alcohol use: No    Alcohol/week: 0.0 standard drinks  . Drug use: No     Allergies   Vancomycin, Cefuroxime  axetil, and Sulfa antibiotics   Review of Systems Review of Systems Pertinent negatives listed in HPI   Physical Exam Triage Vital Signs ED Triage Vitals  Enc Vitals Group     BP 05/12/20 1919 114/79     Pulse Rate 05/12/20 1919 (!) 108     Resp 05/12/20 1919 20     Temp 05/12/20 1919 98.2 F (36.8 C)     Temp Source 05/12/20 1919 Oral     SpO2 05/12/20 1919 96 %     Weight --      Height --      Head Circumference --      Peak Flow --      Pain Score 05/12/20 1921 5     Pain Loc --      Pain Edu? --      Excl. in [place]? --    No data found.  Updated Vital Signs BP 114/79 (BP Location: Left Arm)   Pulse (!) 108   Temp 98.2 F (36.8 C) (Oral)   Resp 20   SpO2 96%   Visual Acuity Right Eye Distance:   Left Eye Distance:   Bilateral Distance:    Right Eye Near:   Left Eye Near:    Bilateral Near:     Physical Exam General appearance: alert, non Ill-appearing, no distress Head: Normocephalic, without obvious abnormality, atraumatic ENT: mucosal edema, congestion, oropharynx w/o exudate Respiratory: Respirations even , unlabored, coarse lung sound, no wheezing Heart: rate and rhythm normal. No gallop or murmurs noted on exam  Abdomen: BS +, no distention, no rebound tenderness, or no mass Extremities: No gross deformities Skin: Skin color, texture, turgor normal. No rashes seen  Psych: Appropriate mood and affect. Neurologic: no acute neurological changes  UC Treatments / Results  Labs (all labs ordered are listed, but only abnormal results are displayed) Labs Reviewed - No data to display  EKG   Radiology DG Chest 2 View  Result Date: 05/12/2020 CLINICAL DATA:  Fever and headache. EXAM: CHEST - 2 VIEW COMPARISON:  July 16, 2019 FINDINGS: There is no evidence of an acute infiltrate, pleural effusion or pneumothorax.  The heart size and mediastinal contours are within normal limits. Degenerative changes seen throughout the thoracic spine. IMPRESSION: No  active cardiopulmonary disease. Electronically Signed   By: Virgina Norfolk M.D.   On: 05/12/2020 19:54    Procedures Procedures (including critical care time)  Medications Ordered in UC Medications - No data to display  Initial Impression / Assessment and Plan / UC Course  I have reviewed the triage vital signs and the nursing notes.  Pertinent labs & imaging results that were available during my care of the patient were reviewed by me and considered in my medical decision making (see chart for details).     Chest x-ray negative. Treating for acute bronchitis. Treatment per discharge instruction.  Offered chamber for inhaler, declined by patient's mother who is in room during visit. Afebrile. Follow-up precautions if symptoms do not resolve. Final Clinical Impressions(s) / UC Diagnoses   Final diagnoses:  Acute bronchitis, unspecified organism   Discharge Instructions   None    ED Prescriptions    Medication Sig Dispense Auth. Provider   albuterol (VENTOLIN HFA) 108 (90 Base) MCG/ACT inhaler Inhale 2 puffs into the lungs every 4 (four) hours as needed for wheezing or shortness of breath. 8 g Scot Jun, FNP   doxycycline (VIBRAMYCIN) 100 MG capsule Take 1 capsule (100 mg total) by mouth 2 (two) times daily for 7 days. 14 capsule Scot Jun, FNP   benzonatate (TESSALON) 100 MG capsule Take 1-2 capsules (100-200 mg total) by mouth 3 (three) times daily as needed for cough. 40 capsule Scot Jun, FNP     PDMP not reviewed this encounter.   Scot Jun, FNP 05/15/20 2314

## 2020-05-12 NOTE — ED Triage Notes (Signed)
Pt c/o cold sxs since last weekend. COVID neg on Tues and today. Fever was 100.3 before coming to ofc, also c/o headache. Pain 5/10  Dad who is an MD who thought he might here something in her chest. Albuterol prn.

## 2020-05-18 ENCOUNTER — Encounter (HOSPITAL_COMMUNITY): Admission: RE | Admit: 2020-05-18 | Payer: Medicare HMO | Source: Ambulatory Visit

## 2020-05-22 ENCOUNTER — Other Ambulatory Visit: Payer: Self-pay | Admitting: Family Medicine

## 2020-05-22 MED ORDER — FLOVENT HFA 110 MCG/ACT IN AERO
1.0000 | INHALATION_SPRAY | Freq: Two times a day (BID) | RESPIRATORY_TRACT | 1 refills | Status: DC
Start: 2020-05-22 — End: 2020-09-04

## 2020-05-22 NOTE — Progress Notes (Signed)
Received a message from her dad Dr Ruben Reason- she is having cough and wheeze, recently treated with doxy and had a negative chest film per [facility] UC.  They request a steroid inhaler Meds ordered this encounter  Medications  . fluticasone (FLOVENT HFA) 110 MCG/ACT inhaler    Sig: Inhale 1 puff into the lungs in the morning and at bedtime.    Dispense:  1 each    Refill:  1

## 2020-06-22 NOTE — Patient Instructions (Addendum)
DUE TO COVID-19 ONLY ONE VISITOR IS ALLOWED TO COME WITH YOU AND STAY IN THE WAITING ROOM ONLY DURING PRE OP AND PROCEDURE DAY OF SURGERY. THE 2 VISITORS  MAY VISIT WITH YOU AFTER SURGERY IN YOUR PRIVATE ROOM DURING VISITING HOURS ONLY!  YOU NEED TO HAVE A COVID 19 TEST ON_5/24______ @__2 :55 pm_____, THIS TEST MUST BE DONE BEFORE SURGERY,  COVID TESTING SITE [place] [id number], IT IS ON THE RIGHT GOING OUT [address] APPROXIMATELY  2 MINUTES PAST [employer] ON THE RIGHT. ONCE YOUR COVID TEST IS COMPLETED,  PLEASE BEGIN THE QUARANTINE INSTRUCTIONS AS OUTLINED IN YOUR HANDOUT.                SHADASIA OLDFIELD    Your procedure is scheduled on: 06/29/20   Report to [facility] Main  Entrance   Report to Short stay at 5:15 AM     Call this number if you have problems the morning of surgery [place], NO CHEWING GUM [place].   No food after midnight.    You may have clear liquid until 4:30 AM.    At 4:00 AM drink pre surgery drink.   Nothing by mouth after 4:30 AM.   Take these medicines the morning of surgery with A SIP OF WATER: Allopurinol,  contraceptive. Use your inhaler and bring it with you.      How to Manage Your Diabetes Before and After Surgery  Why is it important to control my blood sugar before and after surgery? . Improving blood sugar levels before and after surgery helps healing and can limit problems. . A way of improving blood sugar control is eating a healthy diet by: o  Eating less sugar and carbohydrates o  Increasing activity/exercise o  Talking with your doctor about reaching your blood sugar goals . High blood sugars (greater than 180 mg/dL) can raise your risk of infections and slow your recovery, so you will need to focus on controlling your diabetes during the weeks before surgery. . Make sure that the doctor who takes care of your  diabetes knows about your planned surgery including the date and location.  How do I manage my blood sugar before surgery? . Check your blood sugar at least 4 times a day, starting 2 days before surgery, to make sure that the level is not too high or low. o Check your blood sugar the morning of your surgery when you wake up and every 2 hours until you get to the Short Stay unit. . If your blood sugar is less than 70 mg/dL, you will need to treat for low blood sugar: o Do not take insulin. o Treat a low blood sugar (less than 70 mg/dL) with  cup of clear juice (cranberry or apple), 4 glucose tablets, OR glucose gel. o Recheck blood sugar in 15 minutes after treatment (to make sure it is greater than 70 mg/dL). If your blood sugar is not greater than 70 mg/dL on recheck, call [phone] for further instructions. . Report your blood sugar to the short stay nurse when you get to Short Stay.  . If you are admitted to the hospital after surgery: o Your blood sugar will be checked by the staff and you will probably be given insulin after surgery (instead of oral diabetes medicines) to make sure you have good blood sugar levels. o The  goal for blood sugar control after surgery is 80-180 mg/dL.   WHAT DO I DO ABOUT MY DIABETES MEDICATION?  Marland Kitchen Do not take oral diabetes medicines (pills) the morning of surgery.   The day of surgery, do not take other diabetes injectables, including Byetta (exenatide), Bydureon (exenatide ER), Victoza (liraglutide), or                            You may not have any metal on your body including hair pins and              piercings  Do not wear jewelry, make-up, lotions, powders or perfumes, deodorant             Do not wear nail polish on your fingernails.  Do not shave  48 hours prior to surgery.     Do not bring valuables to the hospital. [place].  Contacts, dentures or bridgework may not be worn into  surgery.                   Please read over the following fact sheets you were given: _____________________________________________________________________             [facility] - Preparing for Surgery Before surgery, you can play an important role.  Because skin is not sterile, your skin needs to be as free of germs as possible.  You can reduce the number of germs on your skin by washing with CHG (chlorahexidine gluconate) soap before surgery.  CHG is an antiseptic cleaner which kills germs and bonds with the skin to continue killing germs even after washing. Please DO NOT use if you have an allergy to CHG or antibacterial soaps.  If your skin becomes reddened/irritated stop using the CHG and inform your nurse when you arrive at Short Stay. Do not shave (including legs and underarms) for at least 48 hours prior to the first CHG shower.   Please follow these instructions carefully:  1.  Shower with CHG Soap the night before surgery and the  morning of Surgery.  2.  If you choose to wash your hair, wash your hair first as usual with your  normal  shampoo.  3.  After you shampoo, rinse your hair and body thoroughly to remove the  shampoo.                                        4.  Use CHG as you would any other liquid soap.  You can apply chg directly  to the skin and wash                       Gently with a scrungie or clean washcloth.  5.  Apply the CHG Soap to your body ONLY FROM THE NECK DOWN.   Do not use on face/ open                           Wound or open sores. Avoid contact with eyes, ears mouth and genitals (private parts).                       Wash face,  Genitals (private parts) with your normal soap.             6.  Wash thoroughly, paying special attention to the area where your surgery  will be performed.  7.  Thoroughly rinse your body with warm water from the neck down.  8.  DO NOT shower/wash with your normal soap after using and rinsing off  the CHG Soap.              9.  Pat yourself dry with a clean towel.            10.  Wear clean pajamas.            11.  Place clean sheets on your bed the night of your first shower and do not  sleep with pets. Day of Surgery : Do not apply any lotions/deodorants the morning of surgery.  Please wear clean clothes to the hospital/surgery center.  FAILURE TO FOLLOW THESE INSTRUCTIONS MAY RESULT IN THE CANCELLATION OF YOUR SURGERY PATIENT SIGNATURE_________________________________  NURSE SIGNATURE__________________________________  ________________________________________________________________________   [name]  An incentive spirometer is a tool that can help keep your lungs clear and active. This tool measures how well you are filling your lungs with each breath. Taking long deep breaths may help reverse or decrease the chance of developing breathing (pulmonary) problems (especially infection) following:  A long period of time when you are unable to move or be active. BEFORE THE PROCEDURE   If the spirometer includes an indicator to show your best effort, your nurse or respiratory therapist will set it to a desired goal.  If possible, sit up straight or lean slightly forward. Try not to slouch.  Hold the incentive spirometer in an upright position. INSTRUCTIONS FOR USE  1. Sit on the edge of your bed if possible, or sit up as far as you can in bed or on a chair. 2. Hold the incentive spirometer in an upright position. 3. Breathe out normally. 4. Place the mouthpiece in your mouth and seal your lips tightly around it. 5. Breathe in slowly and as deeply as possible, raising the piston or the ball toward the top of the column. 6. Hold your breath for 3-5 seconds or for as long as possible. Allow the piston or ball to fall to the bottom of the column. 7. Remove the mouthpiece from your mouth and breathe out normally. 8. Rest for a few seconds and repeat Steps 1 through 7 at least 10 times every 1-2  hours when you are awake. Take your time and take a few normal breaths between deep breaths. 9. The spirometer may include an indicator to show your best effort. Use the indicator as a goal to work toward during each repetition. 10. After each set of 10 deep breaths, practice coughing to be sure your lungs are clear. If you have an incision (the cut made at the time of surgery), support your incision when coughing by placing a pillow or rolled up towels firmly against it. Once you are able to get out of bed, walk around indoors and cough well. You may stop using the incentive spirometer when instructed by your caregiver.  RISKS AND COMPLICATIONS  Take your time so you do not get dizzy or light-headed.  If you are in pain, you may need to take or ask for pain medication before doing incentive spirometry. It is harder to take a deep breath if you are having pain. AFTER USE  Rest and  breathe slowly and easily.  It can be helpful to keep track of a log of your progress. Your caregiver can provide you with a simple table to help with this. If you are using the spirometer at home, follow these instructions: [place] IF:   You are having difficultly using the spirometer.  You have trouble using the spirometer as often as instructed.  Your pain medication is not giving enough relief while using the spirometer.  You develop fever of 100.5 F (38.1 C) or higher. SEEK IMMEDIATE MEDICAL CARE IF:   You cough up bloody sputum that had not been present before.  You develop fever of 102 F (38.9 C) or greater.  You develop worsening pain at or near the incision site. MAKE SURE YOU:   Understand these instructions.  Will watch your condition.  Will get help right away if you are not doing well or get worse. Document Released: 06/03/2006 Document Revised: 04/15/2011 Document Reviewed: 08/04/2006 [facility] Patient Information 2014 [place],  [place].   ________________________________________________________________________

## 2020-06-23 ENCOUNTER — Encounter (HOSPITAL_COMMUNITY): Payer: Self-pay

## 2020-06-23 ENCOUNTER — Other Ambulatory Visit (HOSPITAL_COMMUNITY): Payer: Medicare HMO

## 2020-06-23 ENCOUNTER — Encounter (HOSPITAL_COMMUNITY)
Admission: RE | Admit: 2020-06-23 | Discharge: 2020-06-23 | Disposition: A | Discharge status: Home / Self Care | Payer: Medicare HMO | Source: Ambulatory Visit | Attending: Orthopedic Surgery | Admitting: Orthopedic Surgery

## 2020-06-23 ENCOUNTER — Other Ambulatory Visit: Payer: Self-pay

## 2020-06-23 DIAGNOSIS — Z01812 Encounter for preprocedural laboratory examination: Secondary | ICD-10-CM | POA: Diagnosis not present

## 2020-06-23 LAB — COMPREHENSIVE METABOLIC PANEL
ALT: 26 U/L (ref 0–44)
AST: 24 U/L (ref 15–41)
Albumin: 3.4 g/dL — ABNORMAL LOW (ref 3.5–5.0)
Alkaline Phosphatase: 39 U/L (ref 38–126)
Anion gap: 15 (ref 5–15)
BUN: 19 mg/dL (ref 6–20)
CO2: 20 mmol/L — ABNORMAL LOW (ref 22–32)
Calcium: 9 mg/dL (ref 8.9–10.3)
Chloride: 100 mmol/L (ref 98–111)
Creatinine, Ser: 0.71 mg/dL (ref 0.44–1.00)
GFR, Estimated: >60 mL/min (ref >60)
Glucose, Bld: 191 mg/dL — ABNORMAL HIGH (ref 70–99)
Potassium: 4.5 mmol/L (ref 3.5–5.1)
Sodium: 135 mmol/L (ref 135–145)
Total Bilirubin: 0.5 mg/dL (ref 0.3–1.2)
Total Protein: 7.8 g/dL (ref 6.5–8.1)

## 2020-06-23 LAB — GLUCOSE, CAPILLARY: Glucose-Capillary: 190 mg/dL — ABNORMAL HIGH (ref 70–99)

## 2020-06-23 LAB — CBC
HCT: 45.8 % (ref 36.0–46.0)
Hemoglobin: 14.2 g/dL (ref 12.0–15.0)
MCH: 28.5 pg (ref 26.0–34.0)
MCHC: 31 g/dL (ref 30.0–36.0)
MCV: 92 fL (ref 80.0–100.0)
Platelets: 293 10*3/uL (ref 150–400)
RBC: 4.98 MIL/uL (ref 3.87–5.11)
RDW: 16.7 % — ABNORMAL HIGH (ref 11.5–15.5)
WBC: 7.3 10*3/uL (ref 4.0–10.5)
nRBC: 0 % (ref 0.0–0.2)

## 2020-06-23 LAB — APTT: aPTT: 27 seconds (ref 24–36)

## 2020-06-23 LAB — PROTIME-INR
INR: 0.9 (ref 0.8–1.2)
Prothrombin Time: 12.6 seconds (ref 11.4–15.2)

## 2020-06-23 LAB — HEMOGLOBIN A1C
Hgb A1c MFr Bld: 9.1 % — ABNORMAL HIGH (ref 4.8–5.6)
Mean Plasma Glucose: 214.47 mg/dL

## 2020-06-23 NOTE — Progress Notes (Addendum)
COVID Vaccine Completed:Yes Date COVID Vaccine completed:08/2019-booster 12/06/19 COVID vaccine manufacturer: Pfizer      PCP - Dr. Lenna Sciara. [place] Cardiologist - none  Chest x-ray - 05/12/20-epic EKG - 05/03/20-epic Stress Test - no ECHO - 05/27/19-epic Cardiac Cath - NA Pacemaker/ICD device last checked:NA  Sleep Study - no CPAP -   Fasting Blood Sugar - 120-170 Checks Blood Sugar _____ times a day 2 times a week. A1c s are always high  Blood Thinner Instructions:NA Aspirin Instructions: Last Dose:  Anesthesia review:   Patient denies shortness of breath, fever, cough and chest pain at PAT appointment Yes. Pt denies SOB with activities. She is a high functioning Downs syndrome person and lives alone.   Patient verbalized understanding of instructions that were given to them at the PAT appointment. Patient was also instructed that they will need to review over the PAT instructions again at home before surgery Yes. A1c was 8.5 last month. Pt's Mother reports that Dr. Alvan Dame is aware and will do the surgery with a high A1c. PCR was not completed. Lab states they didn't get it. Marland Kitchen

## 2020-06-27 ENCOUNTER — Other Ambulatory Visit (HOSPITAL_COMMUNITY): Payer: Medicare HMO

## 2020-06-27 ENCOUNTER — Other Ambulatory Visit (HOSPITAL_COMMUNITY)
Admission: RE | Admit: 2020-06-27 | Discharge: 2020-06-27 | Disposition: A | Discharge status: Home / Self Care | Payer: Medicare HMO | Source: Ambulatory Visit | Attending: Orthopedic Surgery | Admitting: Orthopedic Surgery

## 2020-06-27 DIAGNOSIS — Z01812 Encounter for preprocedural laboratory examination: Secondary | ICD-10-CM | POA: Diagnosis not present

## 2020-06-27 DIAGNOSIS — Z20822 Contact with and (suspected) exposure to covid-19: Secondary | ICD-10-CM | POA: Diagnosis not present

## 2020-06-27 LAB — SARS CORONAVIRUS 2 (TAT 6-24 HRS): SARS Coronavirus 2: NEGATIVE

## 2020-06-27 NOTE — H&P (Signed)
PARTIAL KNEE ADMISSION H&P  Patient is being admitted for left lateral unicompartmental knee arthroplasty.  Subjective:  Chief Complaint:left knee pain.  HPI: Wanda Oneill, 40 y.o. female, has a history of pain and functional disability in the left knee due to arthritis and has failed non-surgical conservative treatments for greater than 12 weeks to includeNSAID's and/or analgesics, corticosteriod injections and activity modification.  Onset of symptoms was gradual, starting 3 years ago with gradually worsening course since that time. The patient noted no past surgery on the left knee(s).  Patient currently rates pain in the left knee(s) at 7 out of 10 with activity. Patient has worsening of pain with activity and weight bearing and pain that interferes with activities of daily living.  Patient has evidence of joint space narrowing by imaging studies. There is no active infection.  Patient Active Problem List   Diagnosis Date Noted  . At risk for obstructive sleep apnea 06/11/2019  . Cough 06/11/2019  . Multifocal pneumonia   . Acute respiratory failure with hypoxia ([place]) 05/26/2019  . Hyperglycemia 05/26/2019  . Acute respiratory failure with hypoxemia ([place]) 05/23/2019  . Acute hypoxemic respiratory failure ([place]) 05/20/2019  . Fibromyalgia 03/09/2019  . Neck pain, chronic 03/09/2019  . Acute shoulder bursitis, right 02/03/2019  . Bile salt-induced diarrhea 09/25/2018  . Arthritis of left acromioclavicular joint 09/23/2018  . Precordial chest pain 08/11/2018  . Morbid obesity ([place]) 10/31/2017  . S/P right UKR, lateral 10/30/2017  . Incarcerated epigastric hernia 05/30/2015  . Left foot pain 08/09/2014  . Chronic diarrhea 07/27/2014  . Gout 08/10/2012  . Acid reflux 01/22/2012  . Down syndrome 11/11/2011  . Hernia of abdominal wall 10/21/2011  . Psoriasis 10/21/2011  . Type 2 diabetes mellitus, uncontrolled ([place]) 04/12/2011  . Hypothyroid 04/12/2011  . Gait abnormality  06/22/2010  . Tibial tendinitis, posterior 06/22/2010   Past Medical History:  Diagnosis Date  . Diabetes mellitus    type II  . Down syndrome   . Family history of adverse reaction to anesthesia    mom has had n/v  . Gastritis   . Headache   . Hepatic steatosis   . Hidradenitis   . Hypothyroidism   . Irritable bowel syndrome   . Periumbilical hernia   . Pneumonia 03/2020   frequent   . Restless legs   . Tendonitis    chronic in left foot  . Thyroid disease    hypothyroidism    Past Surgical History:  Procedure Laterality Date  . AXILLARY HIDRADENITIS EXCISION Bilateral   . CHOLECYSTECTOMY    . HERNIA REPAIR     multiple  . INCISIONAL HERNIA REPAIR  05/30/2015   LAPROSCOPIC  . INCISIONAL HERNIA REPAIR N/A 05/30/2015   Procedure: LAPAROSCOPIC INCISIONAL HERNIA;  Surgeon: Rolm Bookbinder, MD;  Location: [place];  Service: General;  Laterality: N/A;  . INSERTION OF MESH N/A 05/30/2015   Procedure: INSERTION OF MESH;  Surgeon: Rolm Bookbinder, MD;  Location: [place];  Service: General;  Laterality: N/A;  . KNEE ARTHROSCOPY     right knee  . LAPAROSCOPY N/A 05/30/2015   Procedure: LAPAROSCOPY DIAGNOSTIC;  Surgeon: Rolm Bookbinder, MD;  Location: [place];  Service: General;  Laterality: N/A;  . PARTIAL KNEE ARTHROPLASTY Right 10/30/2017   Procedure: UNICOMPARTMENTAL RIGHT KNEE LATERAL;  Surgeon: Paralee Cancel, MD;  Location: [facility];  Service: Orthopedics;  Laterality: Right;  90 mins  . TONSILLECTOMY     adenoids    No current facility-administered medications for this  encounter.   Current Outpatient Medications  Medication Sig Dispense Refill Last Dose  . acetaminophen (TYLENOL) 325 MG tablet Take 650 mg by mouth every 6 (six) hours as needed for mild pain.      Marland Kitchen allopurinol (ZYLOPRIM) 300 MG tablet Take 1 tablet by mouth once daily (Patient taking differently: Take 300 mg by mouth daily.) 90 tablet 3   . cetirizine (ZYRTEC) 10 MG tablet Take 10 mg by mouth daily as needed  for allergies.      . cholestyramine (QUESTRAN) 4 g packet DISSOLVE & TAKE 1 PACKET OF POWDER BY MOUTH TWICE DAILY (Patient taking differently: Take 4 g by mouth 2 (two) times daily.) 180 each 3   . clobetasol cream (TEMOVATE) 0.63 % Apply 1 application topically daily as needed (for eczema).      . dicyclomine (BENTYL) 20 MG tablet Take 20 mg by mouth daily as needed for spasms.     . empagliflozin (JARDIANCE) 25 MG TABS tablet Take 25 mg by mouth daily.     . fluconazole (DIFLUCAN) 150 MG tablet Take 150 mg by mouth every 7 (seven) days.     Marland Kitchen levonorgestrel-ethinyl estradiol (JOLESSA) 0.15-0.03 MG tablet Take 1 tablet by mouth daily. 84 tablet 3   . levothyroxine (EUTHYROX) 137 MCG tablet Take 1 tablet (137 mcg total) by mouth daily before breakfast. 90 tablet 3   . metFORMIN (GLUCOPHAGE) 1000 MG tablet Take 1,000 mg by mouth 2 (two) times daily.   2   . naproxen sodium (ALEVE) 220 MG tablet Take 220 mg by mouth daily as needed (pain).      . ondansetron (ZOFRAN) 4 MG tablet Take 4 mg by mouth every 8 (eight) hours as needed for nausea/vomiting.      . Semaglutide,0.25 or 0.5MG/DOS, 2 MG/1.5ML SOPN Inject 0.5 mg into the skin every Saturday.     . TRULICITY 1.5 KZ/6.0FU SOPN Inject 1.5 mg into the skin every Saturday.     Marland Kitchen VASCEPA 1 g capsule Take 2 g by mouth 2 (two) times daily.     Marland Kitchen albuterol (VENTOLIN HFA) 108 (90 Base) MCG/ACT inhaler Inhale 2 puffs into the lungs every 4 (four) hours as needed for wheezing or shortness of breath. 8 g 0   . benzonatate (TESSALON) 100 MG capsule Take 1-2 capsules (100-200 mg total) by mouth 3 (three) times daily as needed for cough. 40 capsule 0   . Blood Glucose Monitoring Suppl (BLOOD GLUCOSE METER KIT AND SUPPLIES) Dispense based on patient and insurance preference. To check blood sugars daily FOR ICD-9 250.00 (Patient taking differently: 1 each by Other route See admin instructions. Dispense based on patient and insurance preference. To check blood  sugars daily FOR ICD-9 250.00) 1 each 0   . Blood Glucose Monitoring Suppl (TRUE METRIX METER) w/Device KIT Use as directed to check glucose up to two times daily. Dx Code E11.9 1 kit 0   . fluticasone (FLOVENT HFA) 110 MCG/ACT inhaler Inhale 1 puff into the lungs in the morning and at bedtime. 1 each 1   . Glucose Blood (BLOOD GLUCOSE TEST STRIPS) STRP 1 each by Other route See admin instructions. Use to test blood sugar up to twice a day 100 strip 3   . glucose blood (FREESTYLE TEST STRIPS) test strip Use as directed to test blood sugar up to twice a day. Dx code E11.9 100 each 12   . glucose blood (TRUE METRIX BLOOD GLUCOSE TEST) test strip Use as instructed to  check glucose up to 2 times daily. Dx Code E11.9 100 each 12   . ONE TOUCH ULTRA TEST test strip USE TO CHECK BLOOD SUGAR ONCE DAILY (ICD CODE:25.00) (Patient taking differently: 1 each by Other route daily.) 100 each 3   . TRUEplus Lancets 30G MISC Use as directed to check glucose up to two times daily. Dx code E11.9 100 each 3    Allergies  Allergen Reactions  . Vancomycin Itching  . Cefuroxime Axetil Hives  . Sulfa Antibiotics Hives    Social History   Tobacco Use  . Smoking status: Never Smoker  . Smokeless tobacco: Never Used  Substance Use Topics  . Alcohol use: No    Alcohol/week: 0.0 standard drinks    Family History  Problem Relation Age of Onset  . Thyroid cancer Mother   . Diabetes type II Mother   . High Cholesterol Mother   . Heart disease Mother   . Diabetes type II Father   . Hypertension Father   . Stroke Father      Review of Systems  Constitutional: Negative for chills and fever.  Respiratory: Negative for cough and shortness of breath.   Cardiovascular: Negative for chest pain.  Gastrointestinal: Negative for nausea and vomiting.  Musculoskeletal: Positive for arthralgias.    Objective:  Physical Exam   Well nourished and well developed. General: Alert and oriented x3, cooperative and  pleasant, no acute distress. Head: normocephalic, atraumatic, neck supple. Eyes: EOMI.  Musculoskeletal: Left knee exam: No palpable effusion, warmth erythema Full knee extension with slight passive hyperextension with valgus that is correctable. Tenderness laterally No significant lower extremity edema or erythemaCalves soft and nontender. Motor function intact in LE. Strength 5/5 LE bilaterally.  Neuro: Distal pulses 2+. Sensation to light touch intact in LE.  Vital signs in last 24 hours:    Labs:   Estimated body mass index is 40.34 kg/m as calculated from the following:   Height as of 06/23/20: 5' 4"  (1.626 m).   Weight as of 06/23/20: 106.6 kg.   Imaging Review Plain radiographs demonstrate severe degenerative joint disease of the lateral compartment left knee(s). The overall alignment isneutral. The bone quality appears to be adequate for age and reported activity level.   Assessment/Plan:  End stage arthritis, left knee   The patient history, physical examination, clinical judgment of the provider and imaging studies are consistent with end stage degenerative joint disease of the left knee(s) and total knee arthroplasty is deemed medically necessary. The treatment options including medical management, injection therapy arthroscopy and arthroplasty were discussed at length. The risks and benefits of total knee arthroplasty were presented and reviewed. The risks due to aseptic loosening, infection, stiffness, patella tracking problems, thromboembolic complications and other imponderables were discussed. The patient acknowledged the explanation, agreed to proceed with the plan and consent was signed. Patient is being admitted for inpatient treatment for surgery, pain control, PT, OT, prophylactic antibiotics, VTE prophylaxis, progressive ambulation and ADL's and discharge planning. The patient is planning to be discharged home.  Therapy Plans: outpatient therapy at [facility] Disposition: Home with parents Planned DVT Prophylaxis: aspirin 43m BID DME needed: none PCP: Dr. CLorelei Pont clearance received TXA: IV Allergies: sulfa drugs - hives, cefuroxime - hives, vancomycin - hives Anesthesia Concerns: BMI: 40.1 Last HgbA1c: 9.1%  Other: - NO tramadol  AGriffith Citron PA-C Orthopedic Surgery [facility] [place] [phone]

## 2020-06-28 MED ORDER — GENTAMICIN SULFATE 40 MG/ML IJ SOLN
5.0000 mg/kg | INTRAVENOUS | Status: AC
  Administered 2020-06-29: 380 mg via INTRAVENOUS
  Filled 2020-06-28: qty 9.5

## 2020-06-29 ENCOUNTER — Ambulatory Visit (HOSPITAL_COMMUNITY): Payer: Medicare HMO | Admitting: Certified Registered Nurse Anesthetist

## 2020-06-29 ENCOUNTER — Encounter (HOSPITAL_COMMUNITY)
Admission: RE | Disposition: A | Discharge status: Home / Self Care | Payer: Self-pay | Source: Ambulatory Visit | Attending: Orthopedic Surgery

## 2020-06-29 ENCOUNTER — Encounter (HOSPITAL_COMMUNITY): Payer: Self-pay | Admitting: Orthopedic Surgery

## 2020-06-29 ENCOUNTER — Other Ambulatory Visit: Payer: Self-pay

## 2020-06-29 ENCOUNTER — Ambulatory Visit (HOSPITAL_COMMUNITY): Payer: Medicare HMO | Admitting: Physician Assistant

## 2020-06-29 ENCOUNTER — Observation Stay (HOSPITAL_COMMUNITY)
Admission: RE | Admit: 2020-06-29 | Discharge: 2020-07-01 | Disposition: A | Discharge status: Home / Self Care | Payer: Medicare HMO | Source: Ambulatory Visit | Attending: Orthopedic Surgery | Admitting: Orthopedic Surgery

## 2020-06-29 DIAGNOSIS — E119 Type 2 diabetes mellitus without complications: Secondary | ICD-10-CM | POA: Diagnosis not present

## 2020-06-29 DIAGNOSIS — I10 Essential (primary) hypertension: Secondary | ICD-10-CM | POA: Insufficient documentation

## 2020-06-29 DIAGNOSIS — M1712 Unilateral primary osteoarthritis, left knee: Secondary | ICD-10-CM | POA: Diagnosis not present

## 2020-06-29 DIAGNOSIS — Z96652 Presence of left artificial knee joint: Secondary | ICD-10-CM

## 2020-06-29 DIAGNOSIS — Z7984 Long term (current) use of oral hypoglycemic drugs: Secondary | ICD-10-CM | POA: Diagnosis not present

## 2020-06-29 DIAGNOSIS — E039 Hypothyroidism, unspecified: Secondary | ICD-10-CM | POA: Insufficient documentation

## 2020-06-29 DIAGNOSIS — Z79899 Other long term (current) drug therapy: Secondary | ICD-10-CM | POA: Diagnosis not present

## 2020-06-29 DIAGNOSIS — E1165 Type 2 diabetes mellitus with hyperglycemia: Secondary | ICD-10-CM | POA: Diagnosis not present

## 2020-06-29 DIAGNOSIS — G8918 Other acute postprocedural pain: Secondary | ICD-10-CM | POA: Diagnosis not present

## 2020-06-29 HISTORY — PX: PARTIAL KNEE ARTHROPLASTY: SHX2174

## 2020-06-29 LAB — GLUCOSE, CAPILLARY
Glucose-Capillary: 179 mg/dL — ABNORMAL HIGH (ref 70–99)
Glucose-Capillary: 151 mg/dL — ABNORMAL HIGH (ref 70–99)
Glucose-Capillary: 208 mg/dL — ABNORMAL HIGH (ref 70–99)
Glucose-Capillary: 211 mg/dL — ABNORMAL HIGH (ref 70–99)

## 2020-06-29 LAB — PREGNANCY, URINE: Preg Test, Ur: NEGATIVE

## 2020-06-29 LAB — TYPE AND SCREEN
ABO/RH(D): A POS
Antibody Screen: NEGATIVE

## 2020-06-29 SURGERY — ARTHROPLASTY, KNEE, UNICOMPARTMENTAL
Anesthesia: Regional | Site: Knee | Laterality: Left

## 2020-06-29 MED ORDER — PHENOL 1.4 % MT LIQD: 1.0000 | OROMUCOSAL | Status: DC | PRN

## 2020-06-29 MED ORDER — TRANEXAMIC ACID-NACL 1000-0.7 MG/100ML-% IV SOLN
1000.0000 mg | INTRAVENOUS | Status: AC
  Administered 2020-06-29: 1000 mg via INTRAVENOUS
  Filled 2020-06-29: qty 100

## 2020-06-29 MED ORDER — ONDANSETRON HCL 4 MG PO TABS: 4.0000 mg | ORAL_TABLET | Freq: Four times a day (QID) | ORAL | Status: DC | PRN

## 2020-06-29 MED ORDER — PHENYLEPHRINE HCL (PRESSORS) 10 MG/ML IV SOLN
INTRAVENOUS | Status: DC | PRN
  Administered 2020-06-29 (×4): 80 ug via INTRAVENOUS

## 2020-06-29 MED ORDER — DEXMEDETOMIDINE HCL 200 MCG/2ML IV SOLN
INTRAVENOUS | Status: DC | PRN
  Administered 2020-06-29: 8 ug via INTRAVENOUS
  Administered 2020-06-29: 12 ug via INTRAVENOUS

## 2020-06-29 MED ORDER — METOCLOPRAMIDE HCL 5 MG/ML IJ SOLN
5.0000 mg | Freq: Three times a day (TID) | INTRAMUSCULAR | Status: DC | PRN
  Administered 2020-06-30: 10 mg via INTRAVENOUS
  Filled 2020-06-29: qty 2

## 2020-06-29 MED ORDER — MENTHOL 3 MG MT LOZG: 1.0000 | LOZENGE | OROMUCOSAL | Status: DC | PRN

## 2020-06-29 MED ORDER — PHENYLEPHRINE HCL (PRESSORS) 10 MG/ML IV SOLN
INTRAVENOUS | Status: AC
  Filled 2020-06-29: qty 1

## 2020-06-29 MED ORDER — INSULIN ASPART 100 UNIT/ML IJ SOLN
0.0000 [IU] | Freq: Three times a day (TID) | INTRAMUSCULAR | Status: DC
  Administered 2020-06-29: 3 [IU] via SUBCUTANEOUS
  Administered 2020-06-30 (×2): 5 [IU] via SUBCUTANEOUS
  Administered 2020-07-01: 3 [IU] via SUBCUTANEOUS

## 2020-06-29 MED ORDER — METOCLOPRAMIDE HCL 5 MG PO TABS: 5.0000 mg | ORAL_TABLET | Freq: Three times a day (TID) | ORAL | Status: DC | PRN

## 2020-06-29 MED ORDER — SODIUM CHLORIDE 0.9 % IV SOLN: INTRAVENOUS | Status: DC

## 2020-06-29 MED ORDER — CLOBETASOL PROPIONATE 0.05 % EX CREA
1.0000 "application " | TOPICAL_CREAM | Freq: Every day | CUTANEOUS | Status: DC | PRN
  Filled 2020-06-29: qty 15

## 2020-06-29 MED ORDER — DICYCLOMINE HCL 20 MG PO TABS
20.0000 mg | ORAL_TABLET | Freq: Every day | ORAL | Status: DC | PRN
  Filled 2020-06-29 (×2): qty 1

## 2020-06-29 MED ORDER — LACTATED RINGERS IV SOLN: INTRAVENOUS | Status: DC

## 2020-06-29 MED ORDER — BUPIVACAINE-EPINEPHRINE (PF) 0.25% -1:200000 IJ SOLN
INTRAMUSCULAR | Status: AC
  Filled 2020-06-29: qty 30

## 2020-06-29 MED ORDER — FENTANYL CITRATE (PF) 100 MCG/2ML IJ SOLN
50.0000 ug | INTRAMUSCULAR | Status: DC
  Administered 2020-06-29: 50 ug via INTRAVENOUS
  Filled 2020-06-29: qty 2

## 2020-06-29 MED ORDER — INSULIN ASPART 100 UNIT/ML IJ SOLN
0.0000 [IU] | Freq: Every day | INTRAMUSCULAR | Status: DC
  Administered 2020-06-29: 2 [IU] via SUBCUTANEOUS

## 2020-06-29 MED ORDER — CLINDAMYCIN PHOSPHATE 900 MG/50ML IV SOLN
900.0000 mg | INTRAVENOUS | Status: AC
  Administered 2020-06-29: 900 mg via INTRAVENOUS
  Filled 2020-06-29: qty 50

## 2020-06-29 MED ORDER — CHOLESTYRAMINE LIGHT 4 G PO PACK
4.0000 g | PACK | ORAL | Status: DC
  Administered 2020-06-30 – 2020-07-01 (×3): 4 g via ORAL
  Filled 2020-06-29 (×4): qty 1

## 2020-06-29 MED ORDER — BISACODYL 10 MG RE SUPP: 10.0000 mg | Freq: Every day | RECTAL | Status: DC | PRN

## 2020-06-29 MED ORDER — ONDANSETRON HCL 4 MG/2ML IJ SOLN
INTRAMUSCULAR | Status: DC | PRN
  Administered 2020-06-29: 4 mg via INTRAVENOUS

## 2020-06-29 MED ORDER — MORPHINE SULFATE (PF) 2 MG/ML IV SOLN
0.5000 mg | INTRAVENOUS | Status: DC | PRN
  Administered 2020-06-29 – 2020-06-30 (×2): 1 mg via INTRAVENOUS
  Filled 2020-06-29 (×2): qty 1

## 2020-06-29 MED ORDER — MIDAZOLAM HCL 2 MG/2ML IJ SOLN
1.0000 mg | INTRAMUSCULAR | Status: DC
  Administered 2020-06-29: 2 mg via INTRAVENOUS
  Filled 2020-06-29: qty 2

## 2020-06-29 MED ORDER — BUPIVACAINE IN DEXTROSE 0.75-8.25 % IT SOLN
INTRATHECAL | Status: DC | PRN
  Administered 2020-06-29: 1.8 mL via INTRATHECAL

## 2020-06-29 MED ORDER — ROPIVACAINE HCL 5 MG/ML IJ SOLN
INTRAMUSCULAR | Status: DC | PRN
  Administered 2020-06-29: 20 mL via PERINEURAL

## 2020-06-29 MED ORDER — LEVOTHYROXINE SODIUM 25 MCG PO TABS
137.0000 ug | ORAL_TABLET | Freq: Every day | ORAL | Status: DC
  Administered 2020-06-30 – 2020-07-01 (×2): 137 ug via ORAL
  Filled 2020-06-29 (×2): qty 1

## 2020-06-29 MED ORDER — PHENYLEPHRINE HCL-NACL 10-0.9 MG/250ML-% IV SOLN
INTRAVENOUS | Status: DC | PRN
  Administered 2020-06-29: 40 ug/min via INTRAVENOUS

## 2020-06-29 MED ORDER — METHOCARBAMOL 1000 MG/10ML IJ SOLN
500.0000 mg | Freq: Four times a day (QID) | INTRAVENOUS | Status: DC | PRN
  Filled 2020-06-29: qty 5

## 2020-06-29 MED ORDER — ALLOPURINOL 300 MG PO TABS
300.0000 mg | ORAL_TABLET | Freq: Every day | ORAL | Status: DC
  Administered 2020-06-30 – 2020-07-01 (×2): 300 mg via ORAL
  Filled 2020-06-29 (×2): qty 1

## 2020-06-29 MED ORDER — SODIUM CHLORIDE (PF) 0.9 % IJ SOLN
INTRAMUSCULAR | Status: DC | PRN
  Administered 2020-06-29: 30 mL

## 2020-06-29 MED ORDER — OXYCODONE HCL 5 MG PO TABS
5.0000 mg | ORAL_TABLET | Freq: Once | ORAL | Status: DC | PRN
Start: 2020-06-29 — End: 2020-06-29

## 2020-06-29 MED ORDER — DIPHENHYDRAMINE HCL 12.5 MG/5ML PO ELIX: 12.5000 mg | ORAL_SOLUTION | ORAL | Status: DC | PRN

## 2020-06-29 MED ORDER — TRANEXAMIC ACID-NACL 1000-0.7 MG/100ML-% IV SOLN
1000.0000 mg | Freq: Once | INTRAVENOUS | Status: AC
  Administered 2020-06-29: 1000 mg via INTRAVENOUS
  Filled 2020-06-29: qty 100

## 2020-06-29 MED ORDER — POVIDONE-IODINE 10 % EX SWAB: 2.0000 "application " | Freq: Once | CUTANEOUS | Status: DC

## 2020-06-29 MED ORDER — HYDROCODONE-ACETAMINOPHEN 5-325 MG PO TABS
1.0000 | ORAL_TABLET | ORAL | Status: DC | PRN
  Administered 2020-06-29: 2 via ORAL
  Administered 2020-07-01: 1 via ORAL
  Filled 2020-06-29: qty 2
  Filled 2020-06-29: qty 1

## 2020-06-29 MED ORDER — EMPAGLIFLOZIN 25 MG PO TABS
25.0000 mg | ORAL_TABLET | Freq: Every day | ORAL | Status: DC
  Administered 2020-06-30 – 2020-07-01 (×2): 25 mg via ORAL
  Filled 2020-06-29 (×2): qty 1

## 2020-06-29 MED ORDER — FENTANYL CITRATE (PF) 100 MCG/2ML IJ SOLN
INTRAMUSCULAR | Status: AC
  Filled 2020-06-29: qty 2

## 2020-06-29 MED ORDER — CHLORHEXIDINE GLUCONATE 0.12 % MT SOLN
15.0000 mL | Freq: Once | OROMUCOSAL | Status: AC
  Administered 2020-06-29: 15 mL via OROMUCOSAL

## 2020-06-29 MED ORDER — DEXAMETHASONE SODIUM PHOSPHATE 10 MG/ML IJ SOLN: 10.0000 mg | Freq: Once | INTRAMUSCULAR | Status: DC

## 2020-06-29 MED ORDER — SODIUM CHLORIDE (PF) 0.9 % IJ SOLN
INTRAMUSCULAR | Status: AC
  Filled 2020-06-29: qty 30

## 2020-06-29 MED ORDER — ONDANSETRON HCL 4 MG/2ML IJ SOLN
4.0000 mg | Freq: Four times a day (QID) | INTRAMUSCULAR | Status: DC | PRN
  Administered 2020-06-29 – 2020-07-01 (×5): 4 mg via INTRAVENOUS
  Filled 2020-06-29 (×5): qty 2

## 2020-06-29 MED ORDER — LORATADINE 10 MG PO TABS: 10.0000 mg | ORAL_TABLET | Freq: Every day | ORAL | Status: DC | PRN

## 2020-06-29 MED ORDER — POLYETHYLENE GLYCOL 3350 17 G PO PACK: 17.0000 g | PACK | Freq: Every day | ORAL | Status: DC | PRN

## 2020-06-29 MED ORDER — ICOSAPENT ETHYL 1 G PO CAPS
2.0000 g | ORAL_CAPSULE | Freq: Two times a day (BID) | ORAL | Status: DC
  Administered 2020-06-29 – 2020-07-01 (×3): 2 g via ORAL
  Filled 2020-06-29 (×4): qty 2

## 2020-06-29 MED ORDER — KETOROLAC TROMETHAMINE 30 MG/ML IJ SOLN
INTRAMUSCULAR | Status: AC
  Filled 2020-06-29: qty 1

## 2020-06-29 MED ORDER — BENZONATATE 100 MG PO CAPS: 100.0000 mg | ORAL_CAPSULE | Freq: Three times a day (TID) | ORAL | Status: DC | PRN

## 2020-06-29 MED ORDER — ALBUTEROL SULFATE (2.5 MG/3ML) 0.083% IN NEBU: 3.0000 mL | INHALATION_SOLUTION | RESPIRATORY_TRACT | Status: DC | PRN

## 2020-06-29 MED ORDER — SODIUM CHLORIDE 0.9 % IR SOLN
Status: DC | PRN
  Administered 2020-06-29: 1000 mL

## 2020-06-29 MED ORDER — LEVONORGEST-ETH ESTRAD 91-DAY 0.15-0.03 MG PO TABS
1.0000 | ORAL_TABLET | Freq: Every day | ORAL | Status: DC
  Administered 2020-06-30: 1 via ORAL

## 2020-06-29 MED ORDER — ORAL CARE MOUTH RINSE: 15.0000 mL | Freq: Once | OROMUCOSAL | Status: AC

## 2020-06-29 MED ORDER — DOXYCYCLINE HYCLATE 100 MG PO TABS
100.0000 mg | ORAL_TABLET | Freq: Two times a day (BID) | ORAL | Status: DC
  Administered 2020-06-29 – 2020-07-01 (×5): 100 mg via ORAL
  Filled 2020-06-29 (×5): qty 1

## 2020-06-29 MED ORDER — FENTANYL CITRATE (PF) 100 MCG/2ML IJ SOLN
INTRAMUSCULAR | Status: DC | PRN
  Administered 2020-06-29 (×2): 50 ug via INTRAVENOUS

## 2020-06-29 MED ORDER — KETOROLAC TROMETHAMINE 30 MG/ML IJ SOLN
INTRAMUSCULAR | Status: DC | PRN
  Administered 2020-06-29: 30 mg via INTRAVENOUS

## 2020-06-29 MED ORDER — ONDANSETRON HCL 4 MG/2ML IJ SOLN: 4.0000 mg | Freq: Four times a day (QID) | INTRAMUSCULAR | Status: DC | PRN

## 2020-06-29 MED ORDER — BUDESONIDE 0.25 MG/2ML IN SUSP
0.2500 mg | Freq: Two times a day (BID) | RESPIRATORY_TRACT | Status: DC
  Administered 2020-06-30 – 2020-07-01 (×3): 0.25 mg via RESPIRATORY_TRACT
  Filled 2020-06-29 (×3): qty 2

## 2020-06-29 MED ORDER — ASPIRIN 81 MG PO CHEW
81.0000 mg | CHEWABLE_TABLET | Freq: Two times a day (BID) | ORAL | Status: DC
  Administered 2020-06-29 – 2020-07-01 (×4): 81 mg via ORAL
  Filled 2020-06-29 (×4): qty 1

## 2020-06-29 MED ORDER — FERROUS SULFATE 325 (65 FE) MG PO TABS
325.0000 mg | ORAL_TABLET | Freq: Three times a day (TID) | ORAL | Status: DC
  Administered 2020-06-29 – 2020-07-01 (×2): 325 mg via ORAL
  Filled 2020-06-29 (×3): qty 1

## 2020-06-29 MED ORDER — CLINDAMYCIN PHOSPHATE 900 MG/50ML IV SOLN
INTRAVENOUS | Status: AC
  Filled 2020-06-29: qty 50

## 2020-06-29 MED ORDER — BUPIVACAINE-EPINEPHRINE 0.25% -1:200000 IJ SOLN
INTRAMUSCULAR | Status: DC | PRN
  Administered 2020-06-29: 30 mL

## 2020-06-29 MED ORDER — METFORMIN HCL 500 MG PO TABS
1000.0000 mg | ORAL_TABLET | Freq: Two times a day (BID) | ORAL | Status: DC
  Administered 2020-07-01: 1000 mg via ORAL
  Filled 2020-06-29 (×3): qty 2

## 2020-06-29 MED ORDER — PROPOFOL 500 MG/50ML IV EMUL
INTRAVENOUS | Status: DC | PRN
  Administered 2020-06-29: 75 ug/kg/min via INTRAVENOUS

## 2020-06-29 MED ORDER — ACETAMINOPHEN 325 MG PO TABS: 325.0000 mg | ORAL_TABLET | Freq: Four times a day (QID) | ORAL | Status: DC | PRN

## 2020-06-29 MED ORDER — HYDROCODONE-ACETAMINOPHEN 7.5-325 MG PO TABS
1.0000 | ORAL_TABLET | ORAL | Status: DC | PRN
  Administered 2020-06-29: 1 via ORAL
  Administered 2020-06-30: 2 via ORAL
  Administered 2020-06-30: 1 via ORAL
  Administered 2020-06-30 – 2020-07-01 (×4): 2 via ORAL
  Filled 2020-06-29: qty 2
  Filled 2020-06-29 (×2): qty 1
  Filled 2020-06-29 (×2): qty 2
  Filled 2020-06-29: qty 1
  Filled 2020-06-29 (×2): qty 2

## 2020-06-29 MED ORDER — FENTANYL CITRATE (PF) 100 MCG/2ML IJ SOLN: 25.0000 ug | INTRAMUSCULAR | Status: DC | PRN

## 2020-06-29 MED ORDER — METHOCARBAMOL 500 MG PO TABS
500.0000 mg | ORAL_TABLET | Freq: Four times a day (QID) | ORAL | Status: DC | PRN
  Administered 2020-06-29 – 2020-06-30 (×4): 500 mg via ORAL
  Filled 2020-06-29 (×4): qty 1

## 2020-06-29 MED ORDER — DEXAMETHASONE SODIUM PHOSPHATE 10 MG/ML IJ SOLN
8.0000 mg | Freq: Once | INTRAMUSCULAR | Status: AC
  Administered 2020-06-29: 10 mg via INTRAVENOUS

## 2020-06-29 MED ORDER — DOCUSATE SODIUM 100 MG PO CAPS
100.0000 mg | ORAL_CAPSULE | Freq: Two times a day (BID) | ORAL | Status: DC
  Administered 2020-06-29 – 2020-07-01 (×4): 100 mg via ORAL
  Filled 2020-06-29 (×5): qty 1

## 2020-06-29 MED ORDER — OXYCODONE HCL 5 MG/5ML PO SOLN: 5.0000 mg | Freq: Once | ORAL | Status: DC | PRN

## 2020-06-29 SURGICAL SUPPLY — 33 items
BNDG ELASTIC 6X10 VLCR STRL LF (GAUZE/BANDAGES/DRESSINGS) ×2 IMPLANT
CEMENT BONE R 1X40 (Cement) ×2 IMPLANT
CONSTRUCT FIX LAT TIB LT (Miscellaneous) ×2 IMPLANT
COVER SURGICAL LIGHT HANDLE (MISCELLANEOUS) ×2 IMPLANT
CUFF TOURN SGL QUICK 34 (TOURNIQUET CUFF) ×1
CUFF TRNQT CYL 34X4.125X (TOURNIQUET CUFF) ×1 IMPLANT
DERMABOND ADVANCED (GAUZE/BANDAGES/DRESSINGS) ×1
DERMABOND ADVANCED .7 DNX12 (GAUZE/BANDAGES/DRESSINGS) ×1 IMPLANT
DRAPE U-SHAPE 47X51 STRL (DRAPES) ×2 IMPLANT
DRSG AQUACEL AG ADV 3.5X10 (GAUZE/BANDAGES/DRESSINGS) ×2 IMPLANT
DURAPREP 26ML APPLICATOR (WOUND CARE) ×4 IMPLANT
ELECT REM PT RETURN 15FT ADLT (MISCELLANEOUS) ×2 IMPLANT
FIXATION LAT TIB LT (Miscellaneous) ×1 IMPLANT
GLOVE SURG UNDER POLY LF SZ7.5 (GLOVE) ×6 IMPLANT
GOWN STRL REUS W/TWL LRG LVL3 (GOWN DISPOSABLE) ×2 IMPLANT
HOLDER FOLEY CATH W/STRAP (MISCELLANEOUS) ×2 IMPLANT
KIT TURNOVER KIT A (KITS) ×2 IMPLANT
KNEE PARTIAL CEMENT FEM XS (Miscellaneous) ×2 IMPLANT
LEGGING LITHOTOMY PAIR STRL (DRAPES) ×2 IMPLANT
MANIFOLD NEPTUNE II (INSTRUMENTS) ×2 IMPLANT
NDL SAFETY ECLIPSE 18X1.5 (NEEDLE) ×1 IMPLANT
NEEDLE HYPO 18GX1.5 SHARP (NEEDLE) ×1
PACK BLADE SAW RECIP 70 3 PT (BLADE) ×2 IMPLANT
PACK TOTAL KNEE CUSTOM (KITS) ×2 IMPLANT
PENCIL SMOKE EVACUATOR (MISCELLANEOUS) ×2 IMPLANT
SUT MNCRL AB 4-0 PS2 18 (SUTURE) ×2 IMPLANT
SUT STRATAFIX PDS+ 0 24IN (SUTURE) ×2 IMPLANT
SUT VIC AB 1 CT1 36 (SUTURE) ×2 IMPLANT
SUT VIC AB 2-0 CT1 27 (SUTURE) ×2
SUT VIC AB 2-0 CT1 TAPERPNT 27 (SUTURE) ×2 IMPLANT
SYR 3ML LL SCALE MARK (SYRINGE) ×2 IMPLANT
TRAY FOLEY MTR SLVR 16FR STAT (SET/KITS/TRAYS/PACK) ×2 IMPLANT
WRAP KNEE MAXI GEL POST OP (GAUZE/BANDAGES/DRESSINGS) ×2 IMPLANT

## 2020-06-29 NOTE — Progress Notes (Signed)
Pulmicort noted as substitute for home Flovent inhaler. Patient denies home use and declines Pulmicort. She states she hasn't used an inhaler in about 2 months and does not plan to continue home use again.

## 2020-06-29 NOTE — Interval H&P Note (Signed)
History and Physical Interval Note:  06/29/2020 6:51 AM  Wanda Oneill  has presented today for surgery, with the diagnosis of Left knee lateral compartmental osteoarthritis.  The various methods of treatment have been discussed with the patient and family. After consideration of risks, benefits and other options for treatment, the patient has consented to  Procedure(s) with comments: UNICOMPARTMENTAL KNEE LATERALLY (Left) - 70 MINS as a surgical intervention.  The patient's history has been reviewed, patient examined, no change in status, stable for surgery.  I have reviewed the patient's chart and labs.  Questions were answered to the patient's satisfaction.     Mauri Pole

## 2020-06-29 NOTE — Evaluation (Signed)
Physical Therapy Evaluation Patient Details Name: Wanda Oneill MRN: [id number] DOB: Jan 21, 1981 Today's Date: 06/29/2020   History of Present Illness  40 y.o. female admitted for L lateral UKA. PMH R shoulder arthroscopy, Down syndrome, DM, hernia repair.  Clinical Impression  Pt admitted with above diagnosis. Min A for supine to sit. Pt was nauseous with dry heaves upon sitting up. She sat at edge of bed for ~10 minutes during which nausea did not resolve, so assisted pt back to supine.  Initiated HEP. Good progress expected once nausea resolves. Pt currently with functional limitations due to the deficits listed below (see PT Problem List). Pt will benefit from skilled PT to increase their independence and safety with mobility to allow discharge to the venue listed below.       Follow Up Recommendations Outpatient PT;Follow surgeon's recommendation for DC plan and follow-up therapies    Equipment Recommendations  None recommended by PT    Recommendations for Other Services       Precautions / Restrictions Precautions Precautions: Fall;Knee Precaution Booklet Issued: Yes (comment) Precaution Comments: instructed pt and mother in no pillow under knee Restrictions Weight Bearing Restrictions: No      Mobility  Bed Mobility Overal bed mobility: Needs Assistance Bed Mobility: Supine to Sit;Sit to Supine     Supine to sit: Min assist;HOB elevated Sit to supine: +2 for physical assistance;Max assist   General bed mobility comments: min A to raise trunk, used rail; pt became nauseous while sitting at edge of bed, had dry heaving, vitals stable BP 121/83 sitting, RN notified and gave pt IV zofran    Transfers                    Ambulation/Gait             General Gait Details: NT 2* nausea  Stairs            Wheelchair Mobility    Modified Rankin (Stroke Patients Only)       Balance Overall balance assessment: Needs assistance Sitting-balance  support: Feet supported Sitting balance-Leahy Scale: Good                                       Pertinent Vitals/Pain Pain Assessment: 0-10 Pain Score: 8  Pain Descriptors / Indicators: Sore Pain Intervention(s): Limited activity within patient's tolerance;Monitored during session;Premedicated before session;Ice applied    Home Living Family/patient expects to be discharged to:: Private residence Living Arrangements: Alone Available Help at Discharge: Family;Available 24 hours/day Type of Home: Apartment Home Access: Level entry     Home Layout: Two level;Able to live on main level with bedroom/bathroom Home Equipment: Gilford Rile - 2 wheels;Bedside commode Additional Comments: lives alone in an apartment (one level, no steps to enter),  but will stay with parents home which has 2 STE    Prior Function Level of Independence: Independent         Comments: pt attends a special needs school     Hand Dominance        Extremity/Trunk Assessment   Upper Extremity Assessment Upper Extremity Assessment: Overall WFL for tasks assessed    Lower Extremity Assessment Lower Extremity Assessment: LLE deficits/detail LLE Deficits / Details: SLR 3/5, knee flexion AAROM 0-40* LLE Sensation: WNL LLE Coordination: WNL    Cervical / Trunk Assessment Cervical / Trunk Assessment: Normal  Communication   Communication: No  difficulties  Cognition Arousal/Alertness: Awake/alert Behavior During Therapy: WFL for tasks assessed/performed Overall Cognitive Status: Within Functional Limits for tasks assessed                                        General Comments      Exercises Total Joint Exercises Ankle Circles/Pumps: AROM;Both;10 reps;Supine Heel Slides: AAROM;Left;10 reps;Supine   Assessment/Plan    PT Assessment Patient needs continued PT services  PT Problem List Decreased strength;Decreased range of motion;Decreased activity  tolerance;Decreased balance;Decreased mobility;Pain       PT Treatment Interventions DME instruction;Gait training;Therapeutic exercise;Stair training;Patient/family education;Therapeutic activities    PT Goals (Current goals can be found in the Care Plan section)  Acute Rehab PT Goals Patient Stated Goal: return to school PT Goal Formulation: With patient/family Time For Goal Achievement: 07/13/20 Potential to Achieve Goals: Good    Frequency 7X/week   Barriers to discharge        Co-evaluation               AM-PAC PT "6 Clicks" Mobility  Outcome Measure Help needed turning from your back to your side while in a flat bed without using bedrails?: A Little Help needed moving from lying on your back to sitting on the side of a flat bed without using bedrails?: A Little Help needed moving to and from a bed to a chair (including a wheelchair)?: Total Help needed standing up from a chair using your arms (e.g., wheelchair or bedside chair)?: Total Help needed to walk in hospital room?: Total Help needed climbing 3-5 steps with a railing? : Total 6 Click Score: 10    End of Session   Activity Tolerance: Treatment limited secondary to medical complications (Comment) (nausea) Patient left: in bed;with call bell/phone within reach;with bed alarm set;with family/visitor present Nurse Communication: Mobility status;Other (comment) (pt nauseous) PT Visit Diagnosis: Difficulty in walking, not elsewhere classified (R26.2);Pain Pain - Right/Left: Left Pain - part of body: Knee    Time: 1425-1450 PT Time Calculation (min) (ACUTE ONLY): 25 min   Charges:   PT Evaluation $PT Eval Moderate Complexity: 1 Mod PT Treatments $Therapeutic Activity: 8-22 mins       Blondell Reveal Kistler PT 06/29/2020  Acute Rehabilitation Services Pager [phone] Office [phone]

## 2020-06-29 NOTE — Op Note (Signed)
06/29/2020  9:03 AM  PATIENT:  Wanda Oneill  40 y.o. female  PREOPERATIVE DIAGNOSIS:  Right knee lateral compartment isolated osteoarthritis.  POSTOPERATIVE DIAGNOSIS:  Left knee lateral compartment isolated osteoarthritis.  PROCEDURE:  Left knee lateral compartment unicompartmental arthroplasty.  COMPONENTS USED:  A Biomet lateral fixed bearing arthroplasty with a size extra small femur and a left size D3 lateral tibial complex.  SURGEON:  Paralee Cancel, MD  ASSISTANT:  Griffith Citron, PA-C.  Note that Ms. Nehemiah Settle was present for the entirety of the case and preoperative positioning, perioperative management of the operative extremity, general facilitation of the case and primary wound closure.  ANESTHESIA:  Regional plus spinal.  SPECIMENS:  None.  COMPLICATIONS:  None.  ESTIMATED BLOOD LOSS:  Less than 100 mL.  TOURNIQUET:  Set at 250 mmHg for 40 minutes.  DRAINS:  None.  INDICATIONS:  This lady is a very pleasant 40 year old female with Down syndrome who was referred for evaluation and management of an isolated left knee lateral compartment osteoarthritis failing conservative measures including arthroscopic surgery and  medications.  We discussed treatment options of partial versus total knee arthroplasty in the setting of isolated lateral compartment disease.  We discussed the risks of infection and DVT.  The risk of progression of degenerative changes in the other  compartments.  Failure of the implants that would require conversion to total knee arthroplasty.  After discussion with her family, the decision was to proceed with left lateral arthroplasty.  Consent was obtained for benefit of pain relief.  DESCRIPTION OF PROCEDURE:  The patient was brought to the operative theater.  Once adequate anesthesia, preoperative antibiotic administered including clindamycin and gentamicin.  She was positioned supine with a left thigh tourniquet placed.  The left  lower  extremity was then positioned over the Oxford leg holder to allow for 120 degrees of flexion.  The left lower extremity was then prepped and draped in sterile fashion.  A timeout was performed identifying the patient, the planned procedure and  extremity.  Leg was exsanguinated, tourniquet elevated to 250 mmHg.  A lateral paramidline incision was made from the proximal pole of patella to the tubercle.  Soft tissue exposure obtained.  Lateral arthrotomy was made with minimal dissection of the  proximal lateral tibia.  Once the knee was exposed and synovectomy carried out, I first sized the femur to be a size extra small accounting for the defect and the loss of cartilage.  Once this was carried out, we placed extramedullary guide parallel to  the tibial crest.  I made a split through the patella tendon and then used a reciprocating saw along the lateral ridge of the anterior spine of the tibia and then used an oscillating saw to resect bone off the proximal tibia to make a minimal resection  per protocol design.  We then brought the knee to full extension with a size D tibial tray in place.  In extension, I found that the size 3 feeler gauge had appropriate tension at full extension.  Once we did this, I attended to the femoral preparation.  The femoral canal was opened with a drill and opened with a T handle awl and an intramedullary rod passed.  The extra small posterior cutting block guide was then positioned on the proximal cut  surface of the tibia in length to the intramedullary rod.  Drill holes were made in the middle portion of the lateral femoral condyle.  The posterior cutting block was then tamped to  the distal femur and the posterior cut of the femur, made.  At this  point, per protocol, I placed a 0 spigot in the distal femur and milled the distal femur, removing any bone that was a necessary.  We then did a trial reduction with an extra small femur the D tibial tray.  In this position, I could  not get even the #1  alert in place in extension.  For this reason, I started with a 4 spigot.  I placed the 4 spigot on the milled distal femur, removing excessive bone.  Once this was done, we retrialed again with a D tibia, and an extra small femur.  Here, I was able to get the knee to extension with the 3 insert matching flexion. Here I found the knee was stable in extension and flexion.  She was noted based on her Down syndrome to have 10 degrees of passive  hyperextension and this was maintained.  Given these findings, we removed all the trial components.  I drilled the sclerotic bone.  We mixed cement and the final components were opened.  The final components were then cemented into place, cementing the femur first removing excessive cement and  then in a figure-of-four position, placed in the size left D3 lateral tibial complex.  It was then tamped into place and brought into full extension for compression.  Excess cement was removed.  Once the cement fully cured, we checked to make certain  that there was no visualized cement and none was seen and if it was, it was removed.  We then irrigated the knee.  The tourniquet was let down after 40 minutes.  The knee was irrigated.  The extensor mechanism was reapproximated using #1 Vicryl and #1  Stratafix suture.  The remaining wound was closed with 2-0 Vicryl and running Monocryl stitch.  The knee was then cleaned, dried and dressed sterilely using surgical glue and Aquacel dressing.  The knee was wrapped in Ace wrap and ice pack.  Note that we  did do an intraarticular synovial injection of Marcaine, Toradol and saline.  She tolerated the procedure well.  Findings were reviewed with family.  She will be admitted to the hospital overnight for observation.

## 2020-06-29 NOTE — Transfer of Care (Signed)
Immediate Anesthesia Transfer of Care Note  Patient: Wanda Oneill  Procedure(s) Performed: UNICOMPARTMENTAL KNEE LATERALLY (Left Knee)  Patient Location: PACU  Anesthesia Type:Spinal  Level of Consciousness: sedated, patient cooperative and responds to stimulation  Airway & Oxygen Therapy: Patient Spontanous Breathing and Patient connected to face mask oxygen  Post-op Assessment: Report given to RN and Post -op Vital signs reviewed and stable  Post vital signs: Reviewed and stable  Last Vitals:  Vitals Value Taken Time  BP 117/71 06/29/20 1052  Temp    Pulse 75 06/29/20 1053  Resp 18 06/29/20 1053  SpO2 99 % 06/29/20 1053  Vitals shown include unvalidated device data.  Last Pain:  Vitals:   06/29/20 0702  TempSrc:   PainSc: 0-No pain      Patients Stated Pain Goal: 4 (51/83/43 [id number])  Complications: No complications documented.

## 2020-06-29 NOTE — Progress Notes (Signed)
AssistedDr. Marcie Bal with left, ultrasound guided, adductor canal block. Side rails up, monitors on throughout procedure. See vital signs in flow sheet. Tolerated Procedure well.

## 2020-06-29 NOTE — Discharge Instructions (Signed)

## 2020-06-29 NOTE — Anesthesia Procedure Notes (Signed)
Anesthesia Regional Block: Adductor canal block   Pre-Anesthetic Checklist: ,, timeout performed, Correct Patient, Correct Site, Correct Laterality, Correct Procedure, Correct Position, site marked, Risks and benefits discussed,  Surgical consent,  Pre-op evaluation,  At surgeon's request and post-op pain management  Laterality: Left  Prep: chloraprep       Needles:  Injection technique: Single-shot  Needle Type: Echogenic Needle     Needle Length: 9cm  Needle Gauge: 21     Additional Needles:   Narrative:  Start time: 06/29/2020 8:01 AM End time: 06/29/2020 8:08 AM Injection made incrementally with aspirations every 5 mL.  Performed by: Personally  Anesthesiologist: Albertha Ghee, MD  Additional Notes: Pt tolerated the procedure well.

## 2020-06-29 NOTE — Anesthesia Procedure Notes (Signed)
Spinal  Patient location during procedure: OR Start time: 06/29/2020 8:55 AM End time: 06/29/2020 9:02 AM Reason for block: surgical anesthesia Staffing Performed: anesthesiologist  Anesthesiologist: Albertha Ghee, MD Preanesthetic Checklist Completed: patient identified, IV checked, risks and benefits discussed, surgical consent, monitors and equipment checked, pre-op evaluation and timeout performed Spinal Block Patient position: sitting Prep: DuraPrep Patient monitoring: cardiac monitor, continuous pulse ox and blood pressure Approach: midline Location: L3-4 Injection technique: single-shot Needle Needle type: Pencan  Needle gauge: 24 G Needle length: 9 cm Assessment Sensory level: T10 Events: CSF return Additional Notes Functioning IV was confirmed and monitors were applied. Sterile prep and drape, including hand hygiene and sterile gloves were used. The patient was positioned and the spine was prepped. The skin was anesthetized with lidocaine.  Free flow of clear CSF was obtained prior to injecting local anesthetic into the CSF.  The spinal needle aspirated freely following injection.  The needle was carefully withdrawn.  The patient tolerated the procedure well.

## 2020-06-29 NOTE — Anesthesia Postprocedure Evaluation (Signed)
Anesthesia Post Note  Patient: MONEKA MCQUINN  Procedure(s) Performed: UNICOMPARTMENTAL KNEE LATERALLY (Left Knee)     Patient location during evaluation: PACU Anesthesia Type: Regional and Spinal Level of consciousness: oriented and awake and alert Pain management: pain level controlled Vital Signs Assessment: post-procedure vital signs reviewed and stable Respiratory status: spontaneous breathing, respiratory function stable and patient connected to nasal cannula oxygen Cardiovascular status: blood pressure returned to baseline and stable Postop Assessment: no headache, no backache and no apparent nausea or vomiting Anesthetic complications: no   No complications documented.  Last Vitals:  Vitals:   06/29/20 1453 06/29/20 1735  BP: 123/75 122/72  Pulse: 73 77  Resp: 18 16  Temp: 36.4 C 36.6 C  SpO2: 98% 98%    Last Pain:  Vitals:   06/29/20 1735  TempSrc: Oral  PainSc:                  Matt[place] Blue S

## 2020-06-29 NOTE — Anesthesia Preprocedure Evaluation (Signed)
Anesthesia Evaluation  Patient identified by MRN, date of birth, ID band Patient awake    Reviewed: Allergy & Precautions, H&P , NPO status , Patient's Chart, lab work & pertinent test results  Airway Mallampati: II   Neck ROM: full    Dental   Pulmonary    breath sounds clear to auscultation       Cardiovascular negative cardio ROS   Rhythm:regular Rate:Normal     Neuro/Psych  Headaches, Down's syndrome  Neuromuscular disease    GI/Hepatic GERD  ,  Endo/Other  diabetes, Type 2Hypothyroidism Morbid obesity  Renal/GU      Musculoskeletal  (+) Arthritis , Fibromyalgia -  Abdominal   Peds  Hematology   Anesthesia Other Findings   Reproductive/Obstetrics                             Anesthesia Physical Anesthesia Plan  ASA: II  Anesthesia Plan: Spinal and Regional   Post-op Pain Management:  Regional for Post-op pain   Induction: Intravenous  PONV Risk Score and Plan: 2 and Propofol infusion, Treatment may vary due to age or medical condition and Ondansetron  Airway Management Planned: Simple Face Mask  Additional Equipment:   Intra-op Plan:   Post-operative Plan:   Informed Consent: I have reviewed the patients History and Physical, chart, labs and discussed the procedure including the risks, benefits and alternatives for the proposed anesthesia with the patient or authorized representative who has indicated his/her understanding and acceptance.     Dental advisory given  Plan Discussed with: CRNA, Anesthesiologist and Surgeon  Anesthesia Plan Comments:         Anesthesia Quick Evaluation

## 2020-06-29 NOTE — Care Plan (Signed)
Ortho Bundle Case Management Note  Patient Details  Name: TEDDY PENA MRN: [id number] Date of Birth: 12-25-1980  L UKA on 06-29-20 DCP:  Home with parents.  1 story home with 2 ste. DME:  No needs.  Has a RW and 3-in-1 PT:  [facility].  PT eval scheduled on 07-04-20 at 5:00 pm.                    DME Arranged:  N/A DME Agency:  NA  HH Arranged:  NA HH Agency:  NA  Additional Comments: Please contact me with any questions of if this plan should need to change.  Marianne Sofia, RN,CCM [facility]  [phone] 06/29/2020, 8:03 AM

## 2020-06-30 DIAGNOSIS — M1712 Unilateral primary osteoarthritis, left knee: Secondary | ICD-10-CM | POA: Diagnosis not present

## 2020-06-30 DIAGNOSIS — Z79899 Other long term (current) drug therapy: Secondary | ICD-10-CM | POA: Diagnosis not present

## 2020-06-30 DIAGNOSIS — I10 Essential (primary) hypertension: Secondary | ICD-10-CM | POA: Diagnosis not present

## 2020-06-30 DIAGNOSIS — E119 Type 2 diabetes mellitus without complications: Secondary | ICD-10-CM | POA: Diagnosis not present

## 2020-06-30 DIAGNOSIS — E039 Hypothyroidism, unspecified: Secondary | ICD-10-CM | POA: Diagnosis not present

## 2020-06-30 DIAGNOSIS — Z7984 Long term (current) use of oral hypoglycemic drugs: Secondary | ICD-10-CM | POA: Diagnosis not present

## 2020-06-30 LAB — CBC
HCT: 39.1 % (ref 36.0–46.0)
Hemoglobin: 12.1 g/dL (ref 12.0–15.0)
MCH: 28.9 pg (ref 26.0–34.0)
MCHC: 30.9 g/dL (ref 30.0–36.0)
MCV: 93.3 fL (ref 80.0–100.0)
Platelets: 217 10*3/uL (ref 150–400)
RBC: 4.19 MIL/uL (ref 3.87–5.11)
RDW: 16.6 % — ABNORMAL HIGH (ref 11.5–15.5)
WBC: 10.4 10*3/uL (ref 4.0–10.5)
nRBC: 0 % (ref 0.0–0.2)

## 2020-06-30 LAB — GLUCOSE, CAPILLARY
Glucose-Capillary: 222 mg/dL — ABNORMAL HIGH (ref 70–99)
Glucose-Capillary: 245 mg/dL — ABNORMAL HIGH (ref 70–99)
Glucose-Capillary: 207 mg/dL — ABNORMAL HIGH (ref 70–99)
Glucose-Capillary: 200 mg/dL — ABNORMAL HIGH (ref 70–99)

## 2020-06-30 LAB — BASIC METABOLIC PANEL
Anion gap: 6 (ref 5–15)
BUN: 17 mg/dL (ref 6–20)
CO2: 26 mmol/L (ref 22–32)
Calcium: 8.2 mg/dL — ABNORMAL LOW (ref 8.9–10.3)
Chloride: 102 mmol/L (ref 98–111)
Creatinine, Ser: 0.47 mg/dL (ref 0.44–1.00)
GFR, Estimated: >60 mL/min (ref >60)
Glucose, Bld: 211 mg/dL — ABNORMAL HIGH (ref 70–99)
Potassium: 4.3 mmol/L (ref 3.5–5.1)
Sodium: 134 mmol/L — ABNORMAL LOW (ref 135–145)

## 2020-06-30 MED ORDER — DOXYCYCLINE HYCLATE 100 MG PO TABS: 100.0000 mg | ORAL_TABLET | Freq: Two times a day (BID) | ORAL | 0 refills | Status: AC

## 2020-06-30 MED ORDER — MELOXICAM 15 MG PO TABS: 15.0000 mg | ORAL_TABLET | Freq: Every day | ORAL | 2 refills | Status: DC

## 2020-06-30 MED ORDER — HYDROCODONE-ACETAMINOPHEN 5-325 MG PO TABS: 1.0000 | ORAL_TABLET | Freq: Four times a day (QID) | ORAL | 0 refills | Status: DC | PRN

## 2020-06-30 MED ORDER — DOCUSATE SODIUM 100 MG PO CAPS: 100.0000 mg | ORAL_CAPSULE | Freq: Two times a day (BID) | ORAL | 0 refills | Status: DC

## 2020-06-30 MED ORDER — MELOXICAM 15 MG PO TABS
15.0000 mg | ORAL_TABLET | Freq: Every day | ORAL | Status: DC
  Administered 2020-06-30 – 2020-07-01 (×2): 15 mg via ORAL
  Filled 2020-06-30 (×2): qty 1

## 2020-06-30 MED ORDER — METHOCARBAMOL 500 MG PO TABS: 500.0000 mg | ORAL_TABLET | Freq: Four times a day (QID) | ORAL | 0 refills | Status: DC | PRN

## 2020-06-30 MED ORDER — ASPIRIN 81 MG PO CHEW: 81.0000 mg | CHEWABLE_TABLET | Freq: Two times a day (BID) | ORAL | 0 refills | Status: AC

## 2020-06-30 NOTE — Progress Notes (Signed)
Physical Therapy Treatment Patient Details Name: Wanda Oneill MRN: [id number] DOB: 1980-12-08 Today's Date: 06/30/2020    History of Present Illness 40 y.o. female admitted for L lateral UKA. PMH R shoulder arthroscopy, Down syndrome, DM, hernia repair.    PT Comments    POD # 1 am session General bed mobility comments: OOB in bathroom with mom General transfer comment: 25% VC's on safety with turns and hand placement.  Asssisted with toilet transfer. General Gait Details: assisted with amb 12 feet out of bathroom with 7/10 knee pain and MAX c/o nausea.  Unable to tolerate further distance, assisted to recliner. Applied ICE. Pt will need another PT session.   Follow Up Recommendations  Outpatient PT;Follow surgeon's recommendation for DC plan and follow-up therapies     Equipment Recommendations  None recommended by PT    Recommendations for Other Services       Precautions / Restrictions Precautions Precautions: Fall;Knee Precaution Comments: no pillow under knee Restrictions Weight Bearing Restrictions: No LLE Weight Bearing: Weight bearing as tolerated    Mobility  Bed Mobility               General bed mobility comments: OOB in bathroom with mom    Transfers Overall transfer level: Needs assistance Equipment used: Rolling walker (2 wheeled) Transfers: Sit to/from [facility] Sit to Stand: Min assist Stand pivot transfers: Min assist       General transfer comment: 25% VC's on safety with turns and hand placement.  Asssisted with toilet transfer.  Ambulation/Gait Ambulation/Gait assistance: Min guard;Min assist Gait Distance (Feet): 12 Feet Assistive device: Rolling walker (2 wheeled) Gait Pattern/deviations: Step-to pattern;Decreased stance time - left Gait velocity: decreased   General Gait Details: assisted with amb 12 feet out of bathroom with 7/10 knee pain and MAX c/o nausea.  Unable to tolerate further distance, assisted to  recliner.   Stairs             Wheelchair Mobility    Modified Rankin (Stroke Patients Only)       Balance                                            Cognition Arousal/Alertness: Awake/alert Behavior During Therapy: WFL for tasks assessed/performed Overall Cognitive Status: Within Functional Limits for tasks assessed                                 General Comments: AxO x 3 very pleasant      Exercises      General Comments        Pertinent Vitals/Pain Pain Assessment: 0-10 Pain Score: 7  Pain Location: L knee Pain Descriptors / Indicators: Discomfort;Grimacing;Tightness;Tender;Sore;Sharp Pain Intervention(s): Monitored during session;Premedicated before session;Repositioned;Ice applied    Home Living                      Prior Function            PT Goals (current goals can now be found in the care plan section) Progress towards PT goals: Progressing toward goals    Frequency    7X/week      PT Plan Current plan remains appropriate    Co-evaluation              AM-PAC PT "6  Clicks" Mobility   Outcome Measure  Help needed turning from your back to your side while in a flat bed without using bedrails?: A Little Help needed moving from lying on your back to sitting on the side of a flat bed without using bedrails?: A Little Help needed moving to and from a bed to a chair (including a wheelchair)?: A Lot Help needed standing up from a chair using your arms (e.g., wheelchair or bedside chair)?: A Lot Help needed to walk in hospital room?: A Lot Help needed climbing 3-5 steps with a railing? : Total 6 Click Score: 13    End of Session Equipment Utilized During Treatment: Gait belt Activity Tolerance: Other (comment) (nausea) Patient left: in chair;with call bell/phone within reach;with family/visitor present Nurse Communication: Mobility status;Other (comment) (pt vomited her breakfast) PT  Visit Diagnosis: Difficulty in walking, not elsewhere classified (R26.2);Pain Pain - Right/Left: Left Pain - part of body: Knee     Time: 9201-0071 PT Time Calculation (min) (ACUTE ONLY): 15 min  Charges:  $Gait Training: 8-22 mins                     Rica Koyanagi  PTA Acute  Rehabilitation Services Pager      [phone] Office      [phone]

## 2020-06-30 NOTE — Progress Notes (Signed)
Subjective: 1 Day Post-Op Procedure(s) (LRB): UNICOMPARTMENTAL KNEE LATERALLY (Left) Patient reports pain as mild.   Patient seen in rounds with Dr. Alvan Dame.  Patient is well, and has had no acute complaints or problems. No acute events overnight. Foley catheter removed. Patient sat at the edge of the bed with PT. Her only concern this morning was regarding her pain medications only lasting a few hours at a time.  We will continue therapy today.   Objective: Vital signs in last 24 hours: Temp:  [97.5 F (36.4 C)-98.4 F (36.9 C)] 97.5 F (36.4 C) (05/27 0625) Pulse Rate:  [69-82] 77 (05/27 0625) Resp:  [14-46] 16 (05/27 0625) BP: (98-128)/(55-91) 109/64 (05/27 0625) SpO2:  [93 %-100 %] 93 % (05/27 0625)  Intake/Output from previous day:  Intake/Output Summary (Last 24 hours) at 06/30/2020 0724 Last data filed at 06/30/2020 0512 Gross per 24 hour  Intake 2454.15 ml  Output 1225 ml  Net 1229.15 ml     Intake/Output this shift: No intake/output data recorded.  Labs: Recent Labs    06/30/20 0321  HGB 12.1   Recent Labs    06/30/20 0321  WBC 10.4  RBC 4.19  HCT 39.1  PLT 217   Recent Labs    06/30/20 0321  NA 134*  K 4.3  CL 102  CO2 26  BUN 17  CREATININE 0.47  GLUCOSE 211*  CALCIUM 8.2*   No results for input(s): LABPT, INR in the last 72 hours.  Exam: General - Patient is Alert and Oriented Extremity - Neurologically intact Sensation intact distally Intact pulses distally Dorsiflexion/Plantar flexion intact Dressing - dressing C/D/I Motor Function - intact, moving foot and toes well on exam.   Past Medical History:  Diagnosis Date  . Diabetes mellitus    type II  . Down syndrome   . Family history of adverse reaction to anesthesia    mom has had n/v  . Gastritis   . Headache   . Hepatic steatosis   . Hidradenitis   . Hypothyroidism   . Irritable bowel syndrome   . Periumbilical hernia   . Pneumonia 03/2020   frequent   . Restless legs    . Tendonitis    chronic in left foot  . Thyroid disease    hypothyroidism    Assessment/Plan: 1 Day Post-Op Procedure(s) (LRB): UNICOMPARTMENTAL KNEE LATERALLY (Left) Active Problems:   S/P left unicompartmental knee replacement, lateral  Estimated body mass index is 40.34 kg/m as calculated from the following:   Height as of this encounter: 5\' 4"  (1.626 m).   Weight as of this encounter: 106.6 kg. Advance diet Up with therapy   Patient's anticipated LOS is less than 2 midnights, meeting these requirements: - Younger than 2 - Lives within 1 hour of care - Has a competent adult at home to recover with post-op recover - NO history of  - Chronic pain requiring opiods  - Diabetes  - Coronary Artery Disease  - Heart failure  - Heart attack  - Stroke  - DVT/VTE  - Cardiac arrhythmia  - Respiratory Failure/COPD  - Renal failure  - Anemia  - Advanced Liver disease  DVT Prophylaxis - Aspirin Weight bearing as tolerated.  Hemoglobin stable at 12.1 this AM.  Plan is to go Home after hospital stay. Plan for discharge today vs tomorrow pending progress with PT. We will add in Meloxicam for pain relief while she is here.    Patient is scheduled for OPPT. Follow up  in the office in 2 weeks.   Griffith Citron, PA-C Orthopedic Surgery [phone] 06/30/2020, 7:24 AM

## 2020-06-30 NOTE — Progress Notes (Signed)
Physical Therapy Treatment Patient Details Name: Wanda Oneill MRN: [id number] DOB: 04-19-1980 Today's Date: 06/30/2020    History of Present Illness 40 y.o. female admitted for L lateral UKA. PMH R shoulder arthroscopy, Down syndrome, DM, hernia repair.    PT Comments    POD # 1 pm session Assisted OOB to amb to bathroom required increased time and effort due to pain.  No nausea though.  Assisted in bathroom as pt was unable to safely stand and perform self peri care.  Amb back to recliner unable to amb in hallway due to 8/10 knee pain.  Assisted to recliner and performed some TKR TE's followed by ICE. Pt has NOT met goals to saflely D/C to parents home today.  Nausea/vomiting this am and pain inability to perform stair training pm. Pt plans to D/C to her parents home which has 2 steps.  Mom is a retired Writer and dad is a retired MD.    Follow Up Recommendations  Outpatient PT;Follow surgeon's recommendation for DC plan and follow-up therapies     Equipment Recommendations  None recommended by PT    Recommendations for Other Services       Precautions / Restrictions Precautions Precautions: Fall;Knee Precaution Comments: no pillow under knee Restrictions Weight Bearing Restrictions: No LLE Weight Bearing: Weight bearing as tolerated    Mobility  Bed Mobility    Mod Assist esp to scoot assisted from supine to EOB using bed pad.                Transfers Overall transfer level: Needs assistance Equipment used: Rolling walker (2 wheeled) Transfers: Sit to/from [facility] Sit to Stand: Min assist Stand pivot transfers: Min assist       General transfer comment: 25% VC's on safety with turns and hand placement.  Asssisted with toilet transfer.  Ambulation/Gait Ambulation/Gait assistance: Min guard;Min assist Gait Distance (Feet): 24 Feet (12 feet x 2) Assistive device: Rolling walker (2 wheeled) Gait Pattern/deviations: Step-to  pattern;Decreased stance time - left Gait velocity: decreased   General Gait Details: asssisted with amb to and from bathroom with increased time.  distance limited by pain 8/10.  25% VC's on proper walker to self distance and safety with turns.   Stairs             Wheelchair Mobility    Modified Rankin (Stroke Patients Only)       Balance                                            Cognition Arousal/Alertness: Awake/alert Behavior During Therapy: WFL for tasks assessed/performed Overall Cognitive Status: Within Functional Limits for tasks assessed                                 General Comments: AxO x 3 very pleasant      Exercises   Total Knee Replacement TE's following HEP handout 10 reps B LE ankle pumps 05 reps towel squeezes 05 reps knee presses 05 reps heel slides  05 reps SAQ's 05 reps SLR's 05 reps ABD Educated on use of gait belt to assist with TE's Followed by ICE    General Comments        Pertinent Vitals/Pain Pain Assessment: 0-10 Pain Score: 7  Pain Location: L knee Pain  Descriptors / Indicators: Discomfort;Grimacing;Tightness;Tender;Sore;Sharp Pain Intervention(s): Monitored during session;Premedicated before session;Repositioned;Ice applied    Home Living                      Prior Function            PT Goals (current goals can now be found in the care plan section) Progress towards PT goals: Progressing toward goals    Frequency    7X/week      PT Plan Current plan remains appropriate    Co-evaluation              AM-PAC PT "6 Clicks" Mobility   Outcome Measure  Help needed turning from your back to your side while in a flat bed without using bedrails?: A Little Help needed moving from lying on your back to sitting on the side of a flat bed without using bedrails?: A Little Help needed moving to and from a bed to a chair (including a wheelchair)?: A Lot Help needed  standing up from a chair using your arms (e.g., wheelchair or bedside chair)?: A Lot Help needed to walk in hospital room?: A Lot Help needed climbing 3-5 steps with a railing? : Total 6 Click Score: 13    End of Session Equipment Utilized During Treatment: Gait belt Activity Tolerance: Other (comment) (nausea) Patient left: in chair;with call bell/phone within reach;with family/visitor present Nurse Communication: Mobility status;Other (comment) (pt vomited her breakfast) PT Visit Diagnosis: Difficulty in walking, not elsewhere classified (R26.2);Pain Pain - Right/Left: Left Pain - part of body: Knee     Time: 1500-1530 PT Time Calculation (min) (ACUTE ONLY): 30 min  Charges:  $Gait Training: 8-22 mins $Therapeutic Exercise: 8-22 mins                     Rica Koyanagi  PTA Acute  Rehabilitation Services Pager      [phone] Office      [phone]

## 2020-06-30 NOTE — TOC Transition Note (Signed)
Transition of Care [facility]) - CM/SW Discharge Note  Patient Details  Name: Wanda Oneill MRN: [id number] Date of Birth: 04/26/1980  [facility]) CM/SW Contact:  Sherie Don, LCSW Phone Number: 06/30/2020, 9:27 AM  Clinical Narrative: CSW met with patient to confirm discharge plan. Per patient, she will go to OPPT at [facility]. Patient has a rolling walker and 3N1 at home from previous surgery, so there are no DME needs at this time. TOC signing off.  Final next level of care: OP Rehab Barriers to Discharge: No Barriers Identified  Patient Goals and CMS Choice Patient states their goals for this hospitalization and ongoing recovery are:: Discharge home with OPPT through [email or web address].gov Compare Post Acute Care list provided to:: Patient Choice offered to / list presented to : Patient  Discharge Plan and Services        DME Arranged: N/A DME Agency: NA HH Arranged: NA HH Agency: NA  Readmission Risk Interventions Readmission Risk Prevention Plan 06/01/2019 05/23/2019  Post Dischage Appt Complete Complete  Medication Screening Complete Complete  Transportation Screening Complete Complete  Some recent data might be hidden

## 2020-07-01 DIAGNOSIS — Z7984 Long term (current) use of oral hypoglycemic drugs: Secondary | ICD-10-CM | POA: Diagnosis not present

## 2020-07-01 DIAGNOSIS — M1712 Unilateral primary osteoarthritis, left knee: Secondary | ICD-10-CM | POA: Diagnosis not present

## 2020-07-01 DIAGNOSIS — Z79899 Other long term (current) drug therapy: Secondary | ICD-10-CM | POA: Diagnosis not present

## 2020-07-01 DIAGNOSIS — E039 Hypothyroidism, unspecified: Secondary | ICD-10-CM | POA: Diagnosis not present

## 2020-07-01 DIAGNOSIS — E119 Type 2 diabetes mellitus without complications: Secondary | ICD-10-CM | POA: Diagnosis not present

## 2020-07-01 DIAGNOSIS — I10 Essential (primary) hypertension: Secondary | ICD-10-CM | POA: Diagnosis not present

## 2020-07-01 LAB — CBC
HCT: 39.4 % (ref 36.0–46.0)
Hemoglobin: 12.2 g/dL (ref 12.0–15.0)
MCH: 28.7 pg (ref 26.0–34.0)
MCHC: 31 g/dL (ref 30.0–36.0)
MCV: 92.7 fL (ref 80.0–100.0)
Platelets: 239 10*3/uL (ref 150–400)
RBC: 4.25 MIL/uL (ref 3.87–5.11)
RDW: 16.6 % — ABNORMAL HIGH (ref 11.5–15.5)
WBC: 9.3 10*3/uL (ref 4.0–10.5)
nRBC: 0 % (ref 0.0–0.2)

## 2020-07-01 LAB — GLUCOSE, CAPILLARY
Glucose-Capillary: 166 mg/dL — ABNORMAL HIGH (ref 70–99)
Glucose-Capillary: 208 mg/dL — ABNORMAL HIGH (ref 70–99)

## 2020-07-01 NOTE — Plan of Care (Signed)
Pt ready to DC home with parents

## 2020-07-01 NOTE — Progress Notes (Signed)
Physical Therapy Treatment Patient Details Name: Wanda Oneill MRN: [id number] DOB: 01/09/1981 Today's Date: 07/01/2020    History of Present Illness 40 y.o. female admitted for L lateral UKA. PMH R shoulder arthroscopy, Down syndrome, DM, hernia repair.    PT Comments    Pt progressing. Pt mother present for session and feels comfortable with stairs and pt going home today. Pt has HEP top review until OPPT appt on Tuesday  Follow Up Recommendations  Outpatient PT;Follow surgeon's recommendation for DC plan and follow-up therapies     Equipment Recommendations  None recommended by PT    Recommendations for Other Services       Precautions / Restrictions Precautions Precautions: Fall;Knee Restrictions Weight Bearing Restrictions: No LLE Weight Bearing: Weight bearing as tolerated    Mobility  Bed Mobility Overal bed mobility: Needs Assistance Bed Mobility: Supine to Sit     Supine to sit: Min assist     General bed mobility comments: pt using leg lifter to bring LLE off bed, PT assisted lowering leg to floor so pt could use bil LEs to scoot to EOB    Transfers Overall transfer level: Needs assistance Equipment used: Rolling walker (2 wheeled) Transfers: Sit to/from Stand Sit to Stand: Min guard         General transfer comment: cues for hand placement, LLE position  Ambulation/Gait Ambulation/Gait assistance: Min guard;Min assist Gait Distance (Feet): 25 Feet Assistive device: Rolling walker (2 wheeled) Gait Pattern/deviations: Step-to pattern;Decreased stance time - left Gait velocity: decreased   General Gait Details: cues for sequence and RW position, to incr WBing LLE as tol   Stairs Stairs: Yes Stairs assistance: Min assist Stair Management: No rails;Step to pattern;Backwards;With walker Number of Stairs: 1 General stair comments: cues for technique and sequence (attempted fwd however pt unable d/t pain). pt able to get up one step with RW and  min assist. advised mom to place armless chair at top of steps, once pt goes up one step she can sit, turn to side and stand to avoid second step as pt couldnt tol d/t pain with WBing. mom present and in agreement   Wheelchair Mobility    Modified Rankin (Stroke Patients Only)       Balance                                            Cognition Arousal/Alertness: Awake/alert Behavior During Therapy: WFL for tasks assessed/performed Overall Cognitive Status: Within Functional Limits for tasks assessed                                 General Comments: AxO x 3 very pleasant      Exercises      General Comments        Pertinent Vitals/Pain Pain Assessment: Faces Faces Pain Scale: Hurts even more Pain Location: L knee Pain Descriptors / Indicators: Discomfort;Grimacing;Tightness;Tender;Sore;Sharp Pain Intervention(s): Limited activity within patient's tolerance;Monitored during session;Premedicated before session;Repositioned;Ice applied    Home Living                      Prior Function            PT Goals (current goals can now be found in the care plan section) Acute Rehab PT Goals Patient Stated Goal: return to  school PT Goal Formulation: With patient/family Time For Goal Achievement: 07/13/20 Potential to Achieve Goals: Good Progress towards PT goals: Progressing toward goals    Frequency    7X/week      PT Plan Current plan remains appropriate    Co-evaluation              AM-PAC PT "6 Clicks" Mobility   Outcome Measure  Help needed turning from your back to your side while in a flat bed without using bedrails?: A Little Help needed moving from lying on your back to sitting on the side of a flat bed without using bedrails?: A Little Help needed moving to and from a bed to a chair (including a wheelchair)?: A Little Help needed standing up from a chair using your arms (e.g., wheelchair or bedside  chair)?: A Little Help needed to walk in hospital room?: A Little Help needed climbing 3-5 steps with a railing? : A Lot 6 Click Score: 17    End of Session Equipment Utilized During Treatment: Gait belt Activity Tolerance: Patient limited by fatigue;Patient limited by pain Patient left: in chair;with call bell/phone within reach;with family/visitor present Nurse Communication: Mobility status PT Visit Diagnosis: Difficulty in walking, not elsewhere classified (R26.2);Pain Pain - Right/Left: Left Pain - part of body: Knee     Time: 3299-2426 PT Time Calculation (min) (ACUTE ONLY): 28 min  Charges:  $Gait Training: 23-37 mins                     Baxter Flattery, PT  Acute Rehab Dept ([place]) [phone] Pager [phone]  07/01/2020    [facility] 07/01/2020, 11:41 AM

## 2020-07-01 NOTE — Progress Notes (Signed)
Subjective: 2 Days Post-Op Procedure(s) (LRB): UNICOMPARTMENTAL KNEE LATERALLY (Left) Patient reports pain as mild.   Tolerating PO with mild nausea, +void +flatus Has been up with therapy  Objective: Vital signs in last 24 hours: Temp:  [98.3 F (36.8 C)-98.6 F (37 C)] 98.6 F (37 C) (05/28 0517) Pulse Rate:  [76-91] 85 (05/28 0517) Resp:  [16-17] 17 (05/28 0517) BP: (131-135)/(80-104) 134/85 (05/28 0517) SpO2:  [91 %-98 %] 95 % (05/28 0517)  Intake/Output from previous day: 05/27 0701 - 05/28 0700 In: 340 [P.O.:340] Out: -  Intake/Output this shift: No intake/output data recorded.  Recent Labs    06/30/20 0321 07/01/20 0338  HGB 12.1 12.2   Recent Labs    06/30/20 0321 07/01/20 0338  WBC 10.4 9.3  RBC 4.19 4.25  HCT 39.1 39.4  PLT 217 239   Recent Labs    06/30/20 0321  NA 134*  K 4.3  CL 102  CO2 26  BUN 17  CREATININE 0.47  GLUCOSE 211*  CALCIUM 8.2*   No results for input(s): LABPT, INR in the last 72 hours.  Neurologically intact ABD soft Neurovascular intact Sensation intact distally Intact pulses distally Dorsiflexion/Plantar flexion intact Incision: dressing C/D/I No cellulitis present Compartment soft   Assessment/Plan: 2 Days Post-Op Procedure(s) (LRB): UNICOMPARTMENTAL KNEE LATERALLY (Left) Advance diet Up with therapy Plan for D/C today after PT.     Charlyne Petrin 07/01/2020, 8:34 AM

## 2020-07-04 ENCOUNTER — Ambulatory Visit: Payer: Medicare HMO | Attending: Orthopedic Surgery | Admitting: Physical Therapy

## 2020-07-04 ENCOUNTER — Encounter: Payer: Self-pay | Admitting: Physical Therapy

## 2020-07-04 ENCOUNTER — Other Ambulatory Visit: Payer: Self-pay

## 2020-07-04 DIAGNOSIS — M25562 Pain in left knee: Secondary | ICD-10-CM | POA: Insufficient documentation

## 2020-07-04 DIAGNOSIS — R262 Difficulty in walking, not elsewhere classified: Secondary | ICD-10-CM | POA: Insufficient documentation

## 2020-07-04 DIAGNOSIS — M25662 Stiffness of left knee, not elsewhere classified: Secondary | ICD-10-CM | POA: Diagnosis not present

## 2020-07-04 DIAGNOSIS — R600 Localized edema: Secondary | ICD-10-CM | POA: Insufficient documentation

## 2020-07-04 NOTE — Therapy (Signed)
[place]. [place], [place], [address] Phone: [phone]   Fax:  [phone]  Physical Therapy Evaluation  Patient Details  Name: Wanda Oneill MRN: [id number] Date of Birth: 1980-05-09 Referring Provider (PT): Cathe Mons Date: 07/04/2020   PT End of Session - 07/04/20 1744    Visit Number 1    Number of Visits 12    Date for PT Re-Evaluation 08/18/20    Authorization Type [employer]    PT Start Time 0177    PT Stop Time 1745    PT Time Calculation (min) 55 min    Activity Tolerance Patient limited by pain    Behavior During Therapy [facility] for tasks assessed/performed           Past Medical History:  Diagnosis Date  . Diabetes mellitus    type II  . Down syndrome   . Family history of adverse reaction to anesthesia    mom has had n/v  . Gastritis   . Headache   . Hepatic steatosis   . Hidradenitis   . Hypothyroidism   . Irritable bowel syndrome   . Periumbilical hernia   . Pneumonia 03/2020   frequent   . Restless legs   . Tendonitis    chronic in left foot  . Thyroid disease    hypothyroidism    Past Surgical History:  Procedure Laterality Date  . AXILLARY HIDRADENITIS EXCISION Bilateral   . CHOLECYSTECTOMY    . HERNIA REPAIR     multiple  . INCISIONAL HERNIA REPAIR  05/30/2015   LAPROSCOPIC  . INCISIONAL HERNIA REPAIR N/A 05/30/2015   Procedure: LAPAROSCOPIC INCISIONAL HERNIA;  Surgeon: Rolm Bookbinder, MD;  Location: [place];  Service: General;  Laterality: N/A;  . INSERTION OF MESH N/A 05/30/2015   Procedure: INSERTION OF MESH;  Surgeon: Rolm Bookbinder, MD;  Location: [place];  Service: General;  Laterality: N/A;  . KNEE ARTHROSCOPY     right knee  . LAPAROSCOPY N/A 05/30/2015   Procedure: LAPAROSCOPY DIAGNOSTIC;  Surgeon: Rolm Bookbinder, MD;  Location: [place];  Service: General;  Laterality: N/A;  . PARTIAL KNEE ARTHROPLASTY Right 10/30/2017   Procedure: UNICOMPARTMENTAL RIGHT  KNEE LATERAL;  Surgeon: Paralee Cancel, MD;  Location: [facility];  Service: Orthopedics;  Laterality: Right;  90 mins  . TONSILLECTOMY     adenoids    There were no vitals filed for this visit.    Subjective Assessment - 07/04/20 1703    Subjective Patient had some left knee pain for a number of years, she was having pain and had great results with the right knee 2.5 years ago.  She spent two night in the hospital, had some pain and difficulty walking.    Limitations Standing;Walking;House hold activities    Patient Stated Goals have less pain, walk better    Currently in Pain? Yes    Pain Score 8     Pain Location Knee    Pain Orientation Left    Pain Descriptors / Indicators Aching;Constant    Pain Type Acute pain;Surgical pain    Pain Onset In the past 7 days    Pain Frequency Constant    Aggravating Factors  bending, the leg being down pain up to 9/10    Pain Relieving Factors RICE, take pain meds pain down to 4/10    Effect of Pain on Daily Activities difficulty with everything  [facility] PT Assessment - 07/04/20 0001      Assessment   Medical Diagnosis s/p left unicompartmental knee arthroplasty    Referring Provider (PT) Alvan Dame    Onset Date/Surgical Date 06/29/20    Prior Therapy no      Precautions   Precautions None      Restrictions   Weight Bearing Restrictions No      Balance Screen   Has the patient fallen in the past 6 months No    Has the patient had a decrease in activity level because of a fear of falling?  No    Is the patient reluctant to leave their home because of a fear of falling?  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    Leisure no exercises      Observation/Other Assessments-Edema    Edema  Circumferential      Circumferential Edema   Circumferential - Right 41cm    Circumferential - Left  52 cm      ROM / Strength   AROM / PROM / Strength AROM;PROM;Strength      AROM   AROM Assessment Site Knee    Right/Left Knee Left    Left Knee Extension 60    Left Knee Flexion 86      PROM   PROM Assessment Site Knee    Right/Left Knee Left    Left Knee Extension 10   very tgiht   Left Knee Flexion 90      Strength   Strength Assessment Site Knee    Right/Left Knee Left    Left Knee Flexion 3/5    Left Knee Extension 1/5      Palpation   Palpation comment some warmth, very swollen, some ecchymosis      Ambulation/Gait   Gait Comments with FWW, step to, antalgic gait, very slow      Standardized Balance Assessment   Standardized Balance Assessment Timed Up and Go Test      Timed Up and Go Test   Normal TUG (seconds) 70    TUG Comments with walker                      Objective measurements completed on examination: See above findings.       [place] Adult PT Treatment/Exercise - 07/04/20 0001      Vasopneumatic   Number Minutes Vasopneumatic  10 minutes    Vasopnuematic Location  Knee    Vasopneumatic Pressure Low    Vasopneumatic Temperature  44                    PT Short Term Goals - 07/04/20 1747      PT SHORT TERM GOAL #1   Title independent with initial HEP    Time 2    Period Weeks    Status New             PT Long Term Goals - 07/04/20 1748      PT LONG TERM GOAL #1   Title understand RICE    Time 12    Period Weeks    Status New      PT LONG TERM GOAL #2  Title dcrease pain 50%    Time 12    Period Weeks    Status New      PT LONG TERM GOAL #3   Title increase left knee AROM to 0-120 degrees flexion    Time 12    Period Weeks    Status New      PT LONG TERM GOAL #4   Title walk on all surfaces without limp and with minimal; pain    Time 12    Period Weeks    Status New                   Plan - 07/04/20 1745    Clinical Impression Statement Patient underwent a left unicompartmental knee arthroplasty on 06/29/20, she was having pain prior to the surgery, did not use a device, likes to travel, had a two night hospital stay, she is living with her parents right now but plans to go back to her own apartment.  She is very slow, TUG was 70 seconds, has very poor quads, uses a strap to raise and lower the leg, minimal to no quad activation, AROM for flexion was to 86 degrees.    Clinical Decision Making Low    Rehab Potential Good    PT Frequency 2x / week    PT Duration 6 weeks    PT Treatment/Interventions ADLs/Self Care Home Management;Cryotherapy;Electrical Stimulation;Ultrasound;Gait training;Neuromuscular re-education;Balance training;Therapeutic exercise;Therapeutic activities;Stair training;Functional mobility training;Patient/family education;Manual techniques;Vasopneumatic Device    PT Next Visit Plan start AROM and walking, need to get quads active    Consulted and Agree with Plan of Care Patient           Patient will benefit from skilled therapeutic intervention in order to improve the following deficits and impairments:  Abnormal gait,Decreased range of motion,Difficulty walking,Decreased activity tolerance,Pain,Impaired flexibility,Decreased balance,Decreased mobility,Decreased strength,Increased edema,Decreased scar mobility  Visit Diagnosis: Acute pain of left knee - Plan: PT plan of care cert/re-cert  Knee stiff, left - Plan: PT plan of care cert/re-cert  Difficulty in walking, not elsewhere classified - Plan: PT plan of care cert/re-cert  Localized edema - Plan: PT plan of care cert/re-cert     Problem List Patient Active Problem List   Diagnosis Date Noted  . S/P left unicompartmental knee replacement, lateral 06/29/2020  . At risk for obstructive sleep apnea 06/11/2019  . Cough 06/11/2019  . Multifocal pneumonia   . Acute respiratory  failure with hypoxia ([place]) 05/26/2019  . Hyperglycemia 05/26/2019  . Acute respiratory failure with hypoxemia ([place]) 05/23/2019  . Acute hypoxemic respiratory failure ([place]) 05/20/2019  . Fibromyalgia 03/09/2019  . Neck pain, chronic 03/09/2019  . Acute shoulder bursitis, right 02/03/2019  . Bile salt-induced diarrhea 09/25/2018  . Arthritis of left acromioclavicular joint 09/23/2018  . Precordial chest pain 08/11/2018  . Morbid obesity ([place]) 10/31/2017  . S/P right UKR, lateral 10/30/2017  . Incarcerated epigastric hernia 05/30/2015  . Left foot pain 08/09/2014  . Chronic diarrhea 07/27/2014  . Gout 08/10/2012  . Acid reflux 01/22/2012  . Down syndrome 11/11/2011  . Hernia of abdominal wall 10/21/2011  . Psoriasis 10/21/2011  . Type 2 diabetes mellitus, uncontrolled ([place]) 04/12/2011  . Hypothyroid 04/12/2011  . Gait abnormality 06/22/2010  . Tibial tendinitis, posterior 06/22/2010    Sumner Boast., PT 07/04/2020, 5:51 PM  [place]. [place], [place], [address] Phone: [phone]   Fax:  [phone]  Name: Wanda Oneill MRN: [id number] Date  of Birth: 1981-01-17

## 2020-07-06 ENCOUNTER — Encounter (HOSPITAL_COMMUNITY): Payer: Self-pay | Admitting: Orthopedic Surgery

## 2020-07-07 ENCOUNTER — Other Ambulatory Visit: Payer: Self-pay

## 2020-07-07 ENCOUNTER — Ambulatory Visit: Payer: Medicare HMO | Attending: Orthopedic Surgery | Admitting: Physical Therapy

## 2020-07-07 DIAGNOSIS — R262 Difficulty in walking, not elsewhere classified: Secondary | ICD-10-CM | POA: Insufficient documentation

## 2020-07-07 DIAGNOSIS — M25562 Pain in left knee: Secondary | ICD-10-CM | POA: Diagnosis not present

## 2020-07-07 DIAGNOSIS — R600 Localized edema: Secondary | ICD-10-CM | POA: Insufficient documentation

## 2020-07-07 DIAGNOSIS — M25662 Stiffness of left knee, not elsewhere classified: Secondary | ICD-10-CM | POA: Diagnosis not present

## 2020-07-07 NOTE — Therapy (Signed)
[facility]. [place], [place], [address] Phone: [phone]   Fax:  [phone]  Physical Therapy Treatment  Patient Details  Name: Wanda Oneill MRN: [id number] Date of Birth: 11/22/1980 Referring Provider (PT): Cathe Mons Date: 07/07/2020   PT End of Session - 07/07/20 1138    Visit Number 2    Number of Visits 12    Date for PT Re-Evaluation 08/18/20    Authorization Type [employer]    PT Start Time 1100    PT Stop Time 1150    PT Time Calculation (min) 50 min           Past Medical History:  Diagnosis Date  . Diabetes mellitus    type II  . Down syndrome   . Family history of adverse reaction to anesthesia    mom has had n/v  . Gastritis   . Headache   . Hepatic steatosis   . Hidradenitis   . Hypothyroidism   . Irritable bowel syndrome   . Periumbilical hernia   . Pneumonia 03/2020   frequent   . Restless legs   . Tendonitis    chronic in left foot  . Thyroid disease    hypothyroidism    Past Surgical History:  Procedure Laterality Date  . AXILLARY HIDRADENITIS EXCISION Bilateral   . CHOLECYSTECTOMY    . HERNIA REPAIR     multiple  . INCISIONAL HERNIA REPAIR  05/30/2015   LAPROSCOPIC  . INCISIONAL HERNIA REPAIR N/A 05/30/2015   Procedure: LAPAROSCOPIC INCISIONAL HERNIA;  Surgeon: Rolm Bookbinder, MD;  Location: [facility];  Service: General;  Laterality: N/A;  . INSERTION OF MESH N/A 05/30/2015   Procedure: INSERTION OF MESH;  Surgeon: Rolm Bookbinder, MD;  Location: [facility];  Service: General;  Laterality: N/A;  . KNEE ARTHROSCOPY     right knee  . LAPAROSCOPY N/A 05/30/2015   Procedure: LAPAROSCOPY DIAGNOSTIC;  Surgeon: Rolm Bookbinder, MD;  Location: [facility];  Service: General;  Laterality: N/A;  . PARTIAL KNEE ARTHROPLASTY Right 10/30/2017   Procedure: UNICOMPARTMENTAL RIGHT KNEE LATERAL;  Surgeon: Paralee Cancel, MD;  Location: [facility];  Service: Orthopedics;  Laterality: Right;  90  mins  . PARTIAL KNEE ARTHROPLASTY Left 06/29/2020   Procedure: UNICOMPARTMENTAL KNEE LATERALLY;  Surgeon: Paralee Cancel, MD;  Location: [facility];  Service: Orthopedics;  Laterality: Left;  70 MINS  . TONSILLECTOMY     adenoids    There were no vitals filed for this visit.   Subjective Assessment - 07/07/20 1105    Subjective its sore but I am walking. amb with RW slowly    Currently in Pain? Yes    Pain Score 7     Pain Location Knee    Pain Orientation Left                             [facility] Adult PT Treatment/Exercise - 07/07/20 0001      Exercises   Exercises Knee/Hip      Knee/Hip Exercises: Aerobic   Nustep L 4 6 min      Knee/Hip Exercises: Seated   Long Arc Quad AAROM;2 sets;10 reps;Left    Hamstring Curl Strengthening;Left;2 sets;10 reps    Hamstring Limitations yellow tband      Knee/Hip Exercises: Supine   Quad Sets Strengthening;Left;2 sets;10 reps    Short Arc [employer];Left;2 sets;10 reps    Heel Slides  AAROM;2 sets;10 reps;Left    Straight Leg Raises AAROM;2 sets;10 reps;Left    Other Supine Knee/Hip Exercises LE IR 10 x Actvie      Vasopneumatic   Number Minutes Vasopneumatic  12 minutes    Vasopnuematic Location  Knee    Vasopneumatic Pressure Medium                    PT Short Term Goals - 07/04/20 1747      PT SHORT TERM GOAL #1   Title independent with initial HEP    Time 2    Period Weeks    Status New             PT Long Term Goals - 07/04/20 1748      PT LONG TERM GOAL #1   Title understand RICE    Time 12    Period Weeks    Status New      PT LONG TERM GOAL #2   Title dcrease pain 50%    Time 12    Period Weeks    Status New      PT LONG TERM GOAL #3   Title increase left knee AROM to 0-120 degrees flexion    Time 12    Period Weeks    Status New      PT LONG TERM GOAL #4   Title walk on all surfaces without limp and with minimal; pain    Time 12    Period Weeks    Status New                  Plan - 07/07/20 1138    Clinical Impression Statement AAROM with ex but quad is firing just needs encouragement. very slow moving with RW and gait with ER of Left LE. educ on using pillows to not let LE fall out into ER.    PT Treatment/Interventions ADLs/Self Care Home Management;Cryotherapy;Electrical Stimulation;Ultrasound;Gait training;Neuromuscular re-education;Balance training;Therapeutic exercise;Therapeutic activities;Stair training;Functional mobility training;Patient/family education;Manual techniques;Vasopneumatic Device    PT Next Visit Plan start AROM and walking, need to get quads active           Patient will benefit from skilled therapeutic intervention in order to improve the following deficits and impairments:  Abnormal gait,Decreased range of motion,Difficulty walking,Decreased activity tolerance,Pain,Impaired flexibility,Decreased balance,Decreased mobility,Decreased strength,Increased edema,Decreased scar mobility  Visit Diagnosis: Acute pain of left knee  Knee stiff, left  Difficulty in walking, not elsewhere classified  Localized edema     Problem List Patient Active Problem List   Diagnosis Date Noted  . S/P left unicompartmental knee replacement, lateral 06/29/2020  . At risk for obstructive sleep apnea 06/11/2019  . Cough 06/11/2019  . Multifocal pneumonia   . Acute respiratory failure with hypoxia ([facility]) 05/26/2019  . Hyperglycemia 05/26/2019  . Acute respiratory failure with hypoxemia ([facility]) 05/23/2019  . Acute hypoxemic respiratory failure ([facility]) 05/20/2019  . Fibromyalgia 03/09/2019  . Neck pain, chronic 03/09/2019  . Acute shoulder bursitis, right 02/03/2019  . Bile salt-induced diarrhea 09/25/2018  . Arthritis of left acromioclavicular joint 09/23/2018  . Precordial chest pain 08/11/2018  . Morbid obesity ([facility]) 10/31/2017  . S/P right UKR, lateral 10/30/2017  . Incarcerated epigastric hernia 05/30/2015  . Left foot pain  08/09/2014  . Chronic diarrhea 07/27/2014  . Gout 08/10/2012  . Acid reflux 01/22/2012  . Down syndrome 11/11/2011  . Hernia of abdominal wall 10/21/2011  . Psoriasis 10/21/2011  . Type 2 diabetes mellitus, uncontrolled ([facility]) 04/12/2011  .  Hypothyroid 04/12/2011  . Gait abnormality 06/22/2010  . Tibial tendinitis, posterior 06/22/2010    Chakara Bognar,ANGIE PTA 07/07/2020, 11:41 AM  [facility]. [place], [place], [address] Phone: [phone]   Fax:  [phone]  Name: BLISS BEHNKE MRN: [id number] Date of Birth: [birth year]

## 2020-07-10 NOTE — Discharge Summary (Signed)
Physician Discharge Summary   Patient ID: Wanda Oneill MRN: [id number] DOB/AGE: January 25, 1981 40 y.o.  Admit date: 06/29/2020 Discharge date: 07/01/2020  Primary Diagnosis: Left knee lateral compartment isolated osteoarthritis.  Admission Diagnoses:  Past Medical History:  Diagnosis Date  . Diabetes mellitus    type II  . Down syndrome   . Family history of adverse reaction to anesthesia    mom has had n/v  . Gastritis   . Headache   . Hepatic steatosis   . Hidradenitis   . Hypothyroidism   . Irritable bowel syndrome   . Periumbilical hernia   . Pneumonia 03/2020   frequent   . Restless legs   . Tendonitis    chronic in left foot  . Thyroid disease    hypothyroidism   Discharge Diagnoses:   Active Problems:   S/P left unicompartmental knee replacement, lateral  Estimated body mass index is 40.34 kg/m as calculated from the following:   Height as of this encounter: $RemoveBeforeD'5\' 4"'[id number]$  (1.626 m).   Weight as of this encounter: 106.6 kg.  Procedure:  Procedure(s) (LRB): UNICOMPARTMENTAL KNEE LATERALLY (Left)   Consults: None  HPI: This lady is a very pleasant 40 year old female with Down syndrome who was referred for evaluation and management of an isolated left knee lateral compartment osteoarthritis failing conservative measures including arthroscopic surgery and  medications. We discussed treatment options of partial versus total knee arthroplasty in the setting of isolated lateral compartment disease. We discussed the risks of infection and DVT. The risk of progression of degenerative changes in the other  compartments. Failure of the implants that would require conversion to total knee arthroplasty. After discussion with her family, the decision was to proceed with left lateral arthroplasty. Consent was obtained for benefit of pain relief.  Laboratory Data: Admission on 06/29/2020, Discharged on 07/01/2020  Component Date Value Ref Range Status  . Preg Test, Ur  06/29/2020 NEGATIVE  NEGATIVE Final   Comment:        THE SENSITIVITY OF THIS METHODOLOGY IS >20 mIU/mL. Performed at [facility], [place] [address] [place], [place] [address]   . Glucose-Capillary 06/29/2020 179* 70 - 99 mg/dL Final   Glucose reference range applies only to samples taken after fasting for at least 8 hours.  . Comment 1 06/29/2020 Notify RN   Final  . Glucose-Capillary 06/29/2020 151* 70 - 99 mg/dL Final   Glucose reference range applies only to samples taken after fasting for at least 8 hours.  . Glucose-Capillary 06/29/2020 208* 70 - 99 mg/dL Final   Glucose reference range applies only to samples taken after fasting for at least 8 hours.  . WBC 06/30/2020 10.4  4.0 - 10.5 K/uL Final  . RBC 06/30/2020 4.19  3.87 - 5.11 MIL/uL Final  . Hemoglobin 06/30/2020 12.1  12.0 - 15.0 g/dL Final  . HCT 06/30/2020 39.1  36.0 - 46.0 % Final  . MCV 06/30/2020 93.3  80.0 - 100.0 fL Final  . MCH 06/30/2020 28.9  26.0 - 34.0 pg Final  . MCHC 06/30/2020 30.9  30.0 - 36.0 g/dL Final  . RDW 06/30/2020 16.6* 11.5 - 15.5 % Final  . Platelets 06/30/2020 217  150 - 400 K/uL Final  . nRBC 06/30/2020 0.0  0.0 - 0.2 % Final   Performed at [facility], [place] [address] [place], [place] [address]  . Sodium 06/30/2020 134* 135 - 145 mmol/L Final  . Potassium 06/30/2020 4.3  3.5 - 5.1 mmol/L  Final  . Chloride 06/30/2020 102  98 - 111 mmol/L Final  . CO2 06/30/2020 26  22 - 32 mmol/L Final  . Glucose, Bld 06/30/2020 211* 70 - 99 mg/dL Final   Glucose reference range applies only to samples taken after fasting for at least 8 hours.  . BUN 06/30/2020 17  6 - 20 mg/dL Final  . Creatinine, Ser 06/30/2020 0.47  0.44 - 1.00 mg/dL Final  . Calcium 06/30/2020 8.2* 8.9 - 10.3 mg/dL Final  . GFR, Estimated 06/30/2020 >60  >60 mL/min Final   Comment: (NOTE) Calculated using the CKD-EPI Creatinine Equation (2021)   . Anion gap 06/30/2020 6  5 - 15 Final   Performed  at [facility], [place] [address], [place], [place] [address]  . Glucose-Capillary 06/29/2020 211* 70 - 99 mg/dL Final   Glucose reference range applies only to samples taken after fasting for at least 8 hours.  . Glucose-Capillary 06/30/2020 222* 70 - 99 mg/dL Final   Glucose reference range applies only to samples taken after fasting for at least 8 hours.  . Glucose-Capillary 06/30/2020 245* 70 - 99 mg/dL Final   Glucose reference range applies only to samples taken after fasting for at least 8 hours.  . WBC 07/01/2020 9.3  4.0 - 10.5 K/uL Final  . RBC 07/01/2020 4.25  3.87 - 5.11 MIL/uL Final  . Hemoglobin 07/01/2020 12.2  12.0 - 15.0 g/dL Final  . HCT 07/01/2020 39.4  36.0 - 46.0 % Final  . MCV 07/01/2020 92.7  80.0 - 100.0 fL Final  . MCH 07/01/2020 28.7  26.0 - 34.0 pg Final  . MCHC 07/01/2020 31.0  30.0 - 36.0 g/dL Final  . RDW 07/01/2020 16.6* 11.5 - 15.5 % Final  . Platelets 07/01/2020 239  150 - 400 K/uL Final  . nRBC 07/01/2020 0.0  0.0 - 0.2 % Final   Performed at [facility], [place] [address], [place],  [address]  . Glucose-Capillary 06/30/2020 207* 70 - 99 mg/dL Final   Glucose reference range applies only to samples taken after fasting for at least 8 hours.  . Glucose-Capillary 06/30/2020 200* 70 - 99 mg/dL Final   Glucose reference range applies only to samples taken after fasting for at least 8 hours.  . Glucose-Capillary 07/01/2020 166* 70 - 99 mg/dL Final   Glucose reference range applies only to samples taken after fasting for at least 8 hours.  . Glucose-Capillary 07/01/2020 208* 70 - 99 mg/dL Final   Glucose reference range applies only to samples taken after fasting for at least 8 hours.  Hospital Outpatient Visit on 06/27/2020  Component Date Value Ref Range Status  . SARS Coronavirus 2 06/27/2020 NEGATIVE  NEGATIVE Final   Comment: (NOTE) SARS-CoV-2 target nucleic acids are NOT DETECTED.  The SARS-CoV-2 RNA is  generally detectable in upper and lower respiratory specimens during the acute phase of infection. Negative results do not preclude SARS-CoV-2 infection, do not rule out co-infections with other pathogens, and should not be used as the sole basis for treatment or other patient management decisions. Negative results must be combined with clinical observations, patient history, and epidemiological information. The expected result is Negative.  Fact Sheet for Patients: SugarRoll.be  Fact Sheet for Healthcare Providers: https://www.woods-mathews.com/  This test is not yet approved or cleared by the [place] FDA and  has been authorized for detection and/or diagnosis of SARS-CoV-2 by FDA under an Emergency Use Authorization (EUA). This EUA will remain  in effect (meaning this test can be used) for the duration of the COVID-19 declaration under Se                          ction 564(b)(1) of the Act, 21 U.S.C. section 360bbb-3(b)(1), unless the authorization is terminated or revoked sooner.  Performed at [facility], [place] [address], [place], [place] [address]   Hospital Outpatient Visit on 06/23/2020  Component Date Value Ref Range Status  . Hgb A1c MFr Bld 06/23/2020 9.1* 4.8 - 5.6 % Final   Comment: (NOTE) Pre diabetes:          5.7%-6.4%  Diabetes:              >6.4%  Glycemic control for   <7.0% adults with diabetes   . Mean Plasma Glucose 06/23/2020 214.47  mg/dL Final   Performed at [place] [address], [place], [place] [address]  . WBC 06/23/2020 7.3  4.0 - 10.5 K/uL Final  . RBC 06/23/2020 4.98  3.87 - 5.11 MIL/uL Final  . Hemoglobin 06/23/2020 14.2  12.0 - 15.0 g/dL Final  . HCT 06/23/2020 45.8  36.0 - 46.0 % Final  . MCV 06/23/2020 92.0  80.0 - 100.0 fL Final  . MCH 06/23/2020 28.5  26.0 - 34.0 pg Final  . MCHC 06/23/2020 31.0  30.0 - 36.0 g/dL Final  . RDW 06/23/2020 16.7* 11.5 - 15.5 % Final  .  Platelets 06/23/2020 293  150 - 400 K/uL Final  . nRBC 06/23/2020 0.0  0.0 - 0.2 % Final   Performed at [facility], [place] [address], [place], [place] [address]  . Sodium 06/23/2020 135  135 - 145 mmol/L Final  . Potassium 06/23/2020 4.5  3.5 - 5.1 mmol/L Final  . Chloride 06/23/2020 100  98 - 111 mmol/L Final  . CO2 06/23/2020 20* 22 - 32 mmol/L Final  . Glucose, Bld 06/23/2020 191* 70 - 99 mg/dL Final   Glucose reference range applies only to samples taken after fasting for at least 8 hours.  . BUN 06/23/2020 19  6 - 20 mg/dL Final  . Creatinine, Ser 06/23/2020 0.71  0.44 - 1.00 mg/dL Final  . Calcium 06/23/2020 9.0  8.9 - 10.3 mg/dL Final  . Total Protein 06/23/2020 7.8  6.5 - 8.1 g/dL Final  . Albumin 06/23/2020 3.4* 3.5 - 5.0 g/dL Final  . AST 06/23/2020 24  15 - 41 U/L Final  . ALT 06/23/2020 26  0 - 44 U/L Final  . Alkaline Phosphatase 06/23/2020 39  38 - 126 U/L Final  . Total Bilirubin 06/23/2020 0.5  0.3 - 1.2 mg/dL Final  . GFR, Estimated 06/23/2020 >60  >60 mL/min Final   Comment: (NOTE) Calculated using the CKD-EPI Creatinine Equation (2021)   . Anion gap 06/23/2020 15  5 - 15 Final   Performed at [facility], [place] [address] [place], [place] [address]  . Prothrombin Time 06/23/2020 12.6  11.4 - 15.2 seconds Final  . INR 06/23/2020 0.9  0.8 - 1.2 Final   Comment: (NOTE) INR goal varies based on device and disease states. Performed at [facility], [place] [address], [place], [place] [address]   . aPTT 06/23/2020 27  24 - 36 seconds Final   Performed at [facility], [place] [address], [place], [place] [address]  . ABO/RH(D) 06/23/2020 A POS   Final  .  Antibody Screen 06/23/2020 NEG   Final  . Sample Expiration 06/23/2020 07/02/2020,2359   Final  . Extend sample reason 06/23/2020    Final                   Value:NO TRANSFUSIONS OR PREGNANCY IN THE PAST 3 MONTHS Performed at [place] [address] [place], [place] [address]   . Glucose-Capillary 06/23/2020 190* 70 - 99 mg/dL Final   Glucose reference range applies only to samples taken after fasting for at least 8 hours.     X-Rays:No results found.  EKG: Orders placed or performed in visit on 05/03/20  . EKG 12-Lead     Hospital Course: LAURA CALDAS is a 40 y.o. who was admitted to [facility]. They were brought to the operating room on 06/29/2020 and underwent Procedure(s): UNICOMPARTMENTAL KNEE LATERALLY.  Patient tolerated the procedure well and was later transferred to the recovery room and then to the orthopaedic floor for postoperative care. They were given PO and IV analgesics for pain control following their surgery. They were given 24 hours of postoperative antibiotics of  Anti-infectives (From admission, onward)   Start     Dose/Rate Route Frequency Ordered Stop   06/30/20 0000  doxycycline (VIBRA-TABS) 100 MG tablet        100 mg Oral Every 12 hours 06/30/20 0733 07/07/20 2359   06/29/20 1400  doxycycline (VIBRA-TABS) tablet 100 mg  Status:  Discontinued        100 mg Oral Every 12 hours 06/29/20 1200 07/01/20 1742   06/29/20 0920  clindamycin (CLEOCIN) 900 MG/50ML IVPB       Note to Pharmacy: Bridget Hartshorn   : cabinet override      06/29/20 0920 06/29/20 0924   06/29/20 0615  clindamycin (CLEOCIN) IVPB 900 mg        900 mg 100 mL/hr over 30 Minutes Intravenous On call to O.R. 06/29/20 7628 06/29/20 0924   06/29/20 0600  gentamicin (GARAMYCIN) 380 mg in dextrose 5 % 100 mL IVPB        5 mg/kg  75.5 kg (Adjusted) 109.5 mL/hr over 60 Minutes Intravenous On call to O.R. 06/28/20 3151 06/29/20 0925     and started on DVT prophylaxis in the form of Aspirin.   PT and OT were ordered for total joint protocol. Discharge planning consulted to help with postop disposition and equipment needs. Patient had a good night on the evening of surgery. They started to get up OOB with therapy on  POD #1.  Continued to work with therapy into POD #2. Pt was seen during rounds on day two and was ready to go home pending progress with therapy. Pt worked with therapy for one additional session and was meeting their goals. She was discharged to home later that day in stable condition.  Diet: Regular diet Activity: WBAT Follow-up: in 2 weeks Disposition: Home Discharged Condition: good   Discharge Instructions    Call MD / Call 911   Complete by: As directed    If you experience chest pain or shortness of breath, CALL 911 and be transported to the hospital emergency room.  If you develope a fever above 101 F, pus (white drainage) or increased drainage or redness at the wound, or calf pain, call your surgeon's office.   Change dressing   Complete by: As directed    Maintain surgical dressing until follow up in the clinic. If the edges start to  pull up, may reinforce with tape. If the dressing is no longer working, may remove and cover with gauze and tape, but must keep the area dry and clean.  Call with any questions or concerns.   Constipation Prevention   Complete by: As directed    Drink plenty of fluids.  Prune juice may be helpful.  You may use a stool softener, such as Colace (over the counter) 100 mg twice a day.  Use MiraLax (over the counter) for constipation as needed.   Diet - low sodium heart healthy   Complete by: As directed    Increase activity slowly as tolerated   Complete by: As directed    Weight bearing as tolerated with assist device (walker, cane, etc) as directed, use it as long as suggested by your surgeon or therapist, typically at least 4-6 weeks.   Post-operative opioid taper instructions:   Complete by: As directed    POST-OPERATIVE OPIOID TAPER INSTRUCTIONS: It is important to wean off of your opioid medication as soon as possible. If you do not need pain medication after your surgery it is ok to stop day one. Opioids include: Codeine, Hydrocodone(Norco,  Vicodin), Oxycodone(Percocet, oxycontin) and hydromorphone amongst others.  Long term and even short term use of opiods can cause: Increased pain response Dependence Constipation Depression Respiratory depression And more.  Withdrawal symptoms can include Flu like symptoms Nausea, vomiting And more Techniques to manage these symptoms Hydrate well Eat regular healthy meals Stay active Use relaxation techniques(deep breathing, meditating, yoga) Do Not substitute Alcohol to help with tapering If you have been on opioids for less than two weeks and do not have pain than it is ok to stop all together.  Plan to wean off of opioids This plan should start within one week post op of your joint replacement. Maintain the same interval or time between taking each dose and first decrease the dose.  Cut the total daily intake of opioids by one tablet each day Next start to increase the time between doses. The last dose that should be eliminated is the evening dose.      TED hose   Complete by: As directed    Use stockings (TED hose) for 2 weeks on both leg(s).  You may remove them at night for sleeping.     Allergies as of 07/01/2020      Reactions   Vancomycin Itching   Cefuroxime Axetil Hives   Sulfa Antibiotics Hives      Medication List    STOP taking these medications   naproxen sodium 220 MG tablet Commonly known as: ALEVE     TAKE these medications   acetaminophen 325 MG tablet Commonly known as: TYLENOL Take 650 mg by mouth every 6 (six) hours as needed for mild pain.   albuterol 108 (90 Base) MCG/ACT inhaler Commonly known as: VENTOLIN HFA Inhale 2 puffs into the lungs every 4 (four) hours as needed for wheezing or shortness of breath.   allopurinol 300 MG tablet Commonly known as: ZYLOPRIM Take 1 tablet by mouth once daily   aspirin 81 MG chewable tablet Chew 1 tablet (81 mg total) by mouth 2 (two) times daily for 28 days.   benzonatate 100 MG  capsule Commonly known as: TESSALON Take 1-2 capsules (100-200 mg total) by mouth 3 (three) times daily as needed for cough.   blood glucose meter kit and supplies Dispense based on patient and insurance preference. To check blood sugars daily FOR ICD-9 250.00 What  changed:   how much to take  how to take this  when to take this   True Metrix Meter w/Device Kit Use as directed to check glucose up to two times daily. Dx Code E11.9 What changed: Another medication with the same name was changed. Make sure you understand how and when to take each.   cetirizine 10 MG tablet Commonly known as: ZYRTEC Take 10 mg by mouth daily as needed for allergies.   cholestyramine 4 g packet Commonly known as: QUESTRAN DISSOLVE & TAKE 1 PACKET OF POWDER BY MOUTH TWICE DAILY What changed:   how much to take  how to take this  when to take this  additional instructions   clobetasol cream 0.05 % Commonly known as: TEMOVATE Apply 1 application topically daily as needed (for eczema).   dicyclomine 20 MG tablet Commonly known as: BENTYL Take 20 mg by mouth daily as needed for spasms.   docusate sodium 100 MG capsule Commonly known as: COLACE Take 1 capsule (100 mg total) by mouth 2 (two) times daily.   empagliflozin 25 MG Tabs tablet Commonly known as: JARDIANCE Take 25 mg by mouth daily.   Flovent HFA 110 MCG/ACT inhaler Generic drug: fluticasone Inhale 1 puff into the lungs in the morning and at bedtime.   fluconazole 150 MG tablet Commonly known as: DIFLUCAN Take 150 mg by mouth every 7 (seven) days.   HYDROcodone-acetaminophen 5-325 MG tablet Commonly known as: NORCO/VICODIN Take 1-2 tablets by mouth every 6 (six) hours as needed for severe pain.   levonorgestrel-ethinyl estradiol 0.15-0.03 MG tablet Commonly known as: Jolessa Take 1 tablet by mouth daily.   levothyroxine 137 MCG tablet Commonly known as: Euthyrox Take 1 tablet (137 mcg total) by mouth daily before  breakfast.   meloxicam 15 MG tablet Commonly known as: MOBIC Take 1 tablet (15 mg total) by mouth daily.   metFORMIN 1000 MG tablet Commonly known as: GLUCOPHAGE Take 1,000 mg by mouth 2 (two) times daily.   methocarbamol 500 MG tablet Commonly known as: ROBAXIN Take 1 tablet (500 mg total) by mouth every 6 (six) hours as needed for muscle spasms.   ondansetron 4 MG tablet Commonly known as: ZOFRAN Take 4 mg by mouth every 8 (eight) hours as needed for nausea/vomiting.   ONE TOUCH ULTRA TEST test strip Generic drug: glucose blood USE TO CHECK BLOOD SUGAR ONCE DAILY (ICD CODE:25.00) What changed: See the new instructions.   BLOOD GLUCOSE TEST STRIPS Strp 1 each by Other route See admin instructions. Use to test blood sugar up to twice a day What changed: Another medication with the same name was changed. Make sure you understand how and when to take each.   FREESTYLE TEST STRIPS test strip Generic drug: glucose blood Use as directed to test blood sugar up to twice a day. Dx code E11.9 What changed: Another medication with the same name was changed. Make sure you understand how and when to take each.   True Metrix Blood Glucose Test test strip Generic drug: glucose blood Use as instructed to check glucose up to 2 times daily. Dx Code E11.9 What changed: Another medication with the same name was changed. Make sure you understand how and when to take each.   Semaglutide(0.25 or 0.5MG /DOS) 2 MG/1.5ML Sopn Inject 0.5 mg into the skin every Saturday.   TRUEplus Lancets 30G Misc Use as directed to check glucose up to two times daily. Dx code U41.1   Trulicity 1.5 OY/4.3XU Sopn Generic drug:  Dulaglutide Inject 1.5 mg into the skin every Saturday.   Vascepa 1 g capsule Generic drug: icosapent Ethyl Take 2 g by mouth 2 (two) times daily.     ASK your doctor about these medications   doxycycline 100 MG tablet Commonly known as: VIBRA-TABS Take 1 tablet (100 mg total) by  mouth every 12 (twelve) hours for 7 days. Ask about: Should I take this medication?            Discharge Care Instructions  (From admission, onward)         Start     Ordered   06/30/20 0000  Change dressing       Comments: Maintain surgical dressing until follow up in the clinic. If the edges start to pull up, may reinforce with tape. If the dressing is no longer working, may remove and cover with gauze and tape, but must keep the area dry and clean.  Call with any questions or concerns.   06/30/20 3704          Follow-up Information    Maurice March, PA-C. Go on 07/12/2020.   Specialty: Orthopedic Surgery Why: You are scheduled for a post-operative appointment on 07-12-20 at 10:30 am.  Contact information: [address] [place] [address] [phone]               Signed: Griffith Citron, PA-C Orthopedic Surgery 07/10/2020, 7:07 PM

## 2020-07-11 ENCOUNTER — Other Ambulatory Visit: Payer: Self-pay

## 2020-07-11 ENCOUNTER — Ambulatory Visit: Payer: Medicare HMO | Admitting: Physical Therapy

## 2020-07-11 ENCOUNTER — Encounter: Payer: Self-pay | Admitting: Physical Therapy

## 2020-07-11 DIAGNOSIS — M25562 Pain in left knee: Secondary | ICD-10-CM | POA: Diagnosis not present

## 2020-07-11 DIAGNOSIS — R262 Difficulty in walking, not elsewhere classified: Secondary | ICD-10-CM

## 2020-07-11 DIAGNOSIS — M25662 Stiffness of left knee, not elsewhere classified: Secondary | ICD-10-CM | POA: Diagnosis not present

## 2020-07-11 DIAGNOSIS — R600 Localized edema: Secondary | ICD-10-CM

## 2020-07-11 NOTE — Therapy (Signed)
[place]. [place], [place], [address] Phone: [phone]   Fax:  [phone]  Physical Therapy Treatment  Patient Details  Name: Wanda Oneill MRN: [id number] Date of Birth: [birth year] Referring Provider (PT): Cathe Mons Date: 07/11/2020   PT End of Session - 07/11/20 1733    Visit Number 3    Number of Visits 12    Date for PT Re-Evaluation 08/18/20    Authorization Type [employer]    PT Start Time 1657    PT Stop Time 1750    PT Time Calculation (min) 53 min    Activity Tolerance Patient limited by pain    Behavior During Therapy [facility] for tasks assessed/performed           Past Medical History:  Diagnosis Date  . Diabetes mellitus    type II  . Down syndrome   . Family history of adverse reaction to anesthesia    mom has had n/v  . Gastritis   . Headache   . Hepatic steatosis   . Hidradenitis   . Hypothyroidism   . Irritable bowel syndrome   . Periumbilical hernia   . Pneumonia 03/2020   frequent   . Restless legs   . Tendonitis    chronic in left foot  . Thyroid disease    hypothyroidism    Past Surgical History:  Procedure Laterality Date  . AXILLARY HIDRADENITIS EXCISION Bilateral   . CHOLECYSTECTOMY    . HERNIA REPAIR     multiple  . INCISIONAL HERNIA REPAIR  05/30/2015   LAPROSCOPIC  . INCISIONAL HERNIA REPAIR N/A 05/30/2015   Procedure: LAPAROSCOPIC INCISIONAL HERNIA;  Surgeon: Rolm Bookbinder, MD;  Location: [place];  Service: General;  Laterality: N/A;  . INSERTION OF MESH N/A 05/30/2015   Procedure: INSERTION OF MESH;  Surgeon: Rolm Bookbinder, MD;  Location: [place];  Service: General;  Laterality: N/A;  . KNEE ARTHROSCOPY     right knee  . LAPAROSCOPY N/A 05/30/2015   Procedure: LAPAROSCOPY DIAGNOSTIC;  Surgeon: Rolm Bookbinder, MD;  Location: [place];  Service: General;  Laterality: N/A;  . PARTIAL KNEE ARTHROPLASTY Right 10/30/2017   Procedure: UNICOMPARTMENTAL RIGHT  KNEE LATERAL;  Surgeon: Paralee Cancel, MD;  [facility];  Service: Orthopedics;  Laterality: Right;  90 mins  . PARTIAL KNEE ARTHROPLASTY Left 06/29/2020   Procedure: UNICOMPARTMENTAL KNEE LATERALLY;  Surgeon: Paralee Cancel, MD;  Location: [facility];  Service: Orthopedics;  Laterality: Left;  70 MINS  . TONSILLECTOMY     adenoids    There were no vitals filed for this visit.   Subjective Assessment - 07/11/20 1706    Subjective I am doing a little better still hurting    Currently in Pain? Yes    Pain Score 6     Pain Location Knee    Pain Orientation Left    Pain Descriptors / Indicators Aching;Sore                             [facility] Adult PT Treatment/Exercise - 07/11/20 0001      Ambulation/Gait   Gait Comments gait with SPC and HHA x 100 feet, worked on stairs with hand rails CGA and cues      Knee/Hip Exercises: Aerobic   Nustep L 4 6 min      Knee/Hip Exercises: Seated   Long Arc Quad Left;10 reps;3 sets  Hamstring Curl Strengthening;Left;2 sets;10 reps    Hamstring Limitations red tband      Knee/Hip Exercises: Supine   Short Arc Quad Sets Left;3 sets;10 reps    Heel Slides AAROM;2 sets;10 reps;Left    Straight Leg Raises AAROM;2 sets;10 reps;Left    Other Supine Knee/Hip Exercises LE IR 10 x Actvie      Vasopneumatic   Number Minutes Vasopneumatic  10 minutes    Vasopnuematic Location  Knee    Vasopneumatic Pressure Medium    Vasopneumatic Temperature  36                    PT Short Term Goals - 07/11/20 1738      PT SHORT TERM GOAL #1   Title independent with initial HEP    Status Partially Met             PT Long Term Goals - 07/04/20 1748      PT LONG TERM GOAL #1   Title understand RICE    Time 12    Period Weeks    Status New      PT LONG TERM GOAL #2   Title dcrease pain 50%    Time 12    Period Weeks    Status New      PT LONG TERM GOAL #3   Title increase left knee AROM to 0-120 degrees flexion     Time 12    Period Weeks    Status New      PT LONG TERM GOAL #4   Title walk on all surfaces without limp and with minimal; pain    Time 12    Period Weeks    Status New                 Plan - 07/11/20 1737    Clinical Impression Statement I practiced walking with a SPC with her and the stairs, was able to do some step over step with assist and a lot of verbal cues, she still tends to use her hands or her other leg to lift the left leg up.She is moving better and seems to be feeling better, still a very poor quad    PT Next Visit Plan continue to progress, ROM, strength and function    Consulted and Agree with Plan of Care Patient           Patient will benefit from skilled therapeutic intervention in order to improve the following deficits and impairments:  Abnormal gait,Decreased range of motion,Difficulty walking,Decreased activity tolerance,Pain,Impaired flexibility,Decreased balance,Decreased mobility,Decreased strength,Increased edema,Decreased scar mobility  Visit Diagnosis: Knee stiff, left  Acute pain of left knee  Difficulty in walking, not elsewhere classified  Localized edema     Problem List Patient Active Problem List   Diagnosis Date Noted  . S/P left unicompartmental knee replacement, lateral 06/29/2020  . At risk for obstructive sleep apnea 06/11/2019  . Cough 06/11/2019  . Multifocal pneumonia   . Acute respiratory failure with hypoxia ([place]) 05/26/2019  . Hyperglycemia 05/26/2019  . Acute respiratory failure with hypoxemia ([place]) 05/23/2019  . Acute hypoxemic respiratory failure ([place]) 05/20/2019  . Fibromyalgia 03/09/2019  . Neck pain, chronic 03/09/2019  . Acute shoulder bursitis, right 02/03/2019  . Bile salt-induced diarrhea 09/25/2018  . Arthritis of left acromioclavicular joint 09/23/2018  . Precordial chest pain 08/11/2018  . Morbid obesity ([place]) 10/31/2017  . S/P right UKR, lateral 10/30/2017  . Incarcerated epigastric hernia  05/30/2015  .  Left foot pain 08/09/2014  . Chronic diarrhea 07/27/2014  . Gout 08/10/2012  . Acid reflux 01/22/2012  . Down syndrome 11/11/2011  . Hernia of abdominal wall 10/21/2011  . Psoriasis 10/21/2011  . Type 2 diabetes mellitus, uncontrolled ([place]) 04/12/2011  . Hypothyroid 04/12/2011  . Gait abnormality 06/22/2010  . Tibial tendinitis, posterior 06/22/2010    Sumner Boast., PT 07/11/2020, 5:39 PM  [place]. [place], [place], [address] Phone: [phone]   Fax:  [phone]  Name: Wanda Oneill MRN: [id number] Date of Birth: 07/27/1980

## 2020-07-12 ENCOUNTER — Other Ambulatory Visit: Payer: Self-pay | Admitting: Family Medicine

## 2020-07-13 ENCOUNTER — Ambulatory Visit: Payer: Medicare HMO | Admitting: Physical Therapy

## 2020-07-13 ENCOUNTER — Other Ambulatory Visit: Payer: Self-pay

## 2020-07-13 ENCOUNTER — Encounter: Payer: Self-pay | Admitting: Physical Therapy

## 2020-07-13 DIAGNOSIS — R600 Localized edema: Secondary | ICD-10-CM

## 2020-07-13 DIAGNOSIS — M25562 Pain in left knee: Secondary | ICD-10-CM

## 2020-07-13 DIAGNOSIS — M25662 Stiffness of left knee, not elsewhere classified: Secondary | ICD-10-CM | POA: Diagnosis not present

## 2020-07-13 DIAGNOSIS — R262 Difficulty in walking, not elsewhere classified: Secondary | ICD-10-CM | POA: Diagnosis not present

## 2020-07-13 NOTE — Therapy (Signed)
[place]. [place], [place], [address] Phone: [phone]   Fax:  [phone]  Physical Therapy Treatment  Patient Details  Name: Wanda Oneill MRN: [id number] Date of Birth: February 07, 1980 Referring Provider (PT): Alvan Dame   Encounter Date: 07/13/2020   PT End of Session - 07/13/20 1751     Visit Number 4    Number of Visits 12    Date for PT Re-Evaluation 08/18/20    Authorization Type [employer]    PT Start Time 0240    PT Stop Time 9735    PT Time Calculation (min) 56 min    Activity Tolerance Patient tolerated treatment well    Behavior During Therapy [facility] for tasks assessed/performed             Past Medical History:  Diagnosis Date   Diabetes mellitus    type II   Down syndrome    Family history of adverse reaction to anesthesia    mom has had n/v   Gastritis    Headache    Hepatic steatosis    Hidradenitis    Hypothyroidism    Irritable bowel syndrome    Periumbilical hernia    Pneumonia 03/2020   frequent    Restless legs    Tendonitis    chronic in left foot   Thyroid disease    hypothyroidism    Past Surgical History:  Procedure Laterality Date   AXILLARY HIDRADENITIS EXCISION Bilateral    CHOLECYSTECTOMY     HERNIA REPAIR     multiple   INCISIONAL HERNIA REPAIR  05/30/2015   LAPROSCOPIC   INCISIONAL HERNIA REPAIR N/A 05/30/2015   Procedure: LAPAROSCOPIC INCISIONAL HERNIA;  Surgeon: Rolm Bookbinder, MD;  Location: [place];  Service: General;  Laterality: N/A;   INSERTION OF MESH N/A 05/30/2015   Procedure: INSERTION OF MESH;  Surgeon: Rolm Bookbinder, MD;  Location: [place];  Service: General;  Laterality: N/A;   KNEE ARTHROSCOPY     right knee   LAPAROSCOPY N/A 05/30/2015   Procedure: LAPAROSCOPY DIAGNOSTIC;  Surgeon: Rolm Bookbinder, MD;  Location: [place];  Service: General;  Laterality: N/A;   PARTIAL KNEE ARTHROPLASTY Right 10/30/2017   Procedure: UNICOMPARTMENTAL RIGHT KNEE  LATERAL;  Surgeon: Paralee Cancel, MD;  [facility];  Service: Orthopedics;  Laterality: Right;  90 mins   PARTIAL KNEE ARTHROPLASTY Left 06/29/2020   Procedure: UNICOMPARTMENTAL KNEE LATERALLY;  Surgeon: Paralee Cancel, MD;  [facility];  Service: Orthopedics;  Laterality: Left;  70 MINS   TONSILLECTOMY     adenoids    There were no vitals filed for this visit.   Subjective Assessment - 07/13/20 1700     Subjective Moving a little better    Currently in Pain? Yes    Pain Score 6     Pain Location Knee    Pain Orientation Left    Pain Descriptors / Indicators Sore    Aggravating Factors  bending                [facility] PT Assessment - 07/13/20 0001       AROM   Left Knee Extension 20    Left Knee Flexion 93                           [facility] Adult PT Treatment/Exercise - 07/13/20 0001       Ambulation/Gait   Gait Comments practiced gait with a  SPC 3x 100 feet with HHA, slow, some cues for sequencing and step length      Knee/Hip Exercises: Aerobic   Nustep L 4 6 min      Knee/Hip Exercises: Machines for Strengthening   Cybex Knee Flexion 20# 2x10      Knee/Hip Exercises: Seated   Long Arc Quad Left;10 reps;3 sets    [employer] Limitations started out with some AAROM then able to do on own    Hamstring Curl Strengthening;Left;2 sets;10 reps    Hamstring Limitations red tband      Knee/Hip Exercises: Supine   Short Arc Quad Sets Left;3 sets;10 reps    Straight Leg Raises AAROM;2 sets;10 reps;Left    Other Supine Knee/Hip Exercises LE IR 10 x Actvie      Vasopneumatic   Number Minutes Vasopneumatic  10 minutes    Vasopnuematic Location  Knee    Vasopneumatic Pressure Medium    Vasopneumatic Temperature  36                      PT Short Term Goals - 07/13/20 1753       PT SHORT TERM GOAL #1   Title independent with initial HEP    Status Achieved               PT Long Term Goals - 07/13/20 1754       PT LONG  TERM GOAL #1   Title understand RICE    Status Achieved                   Plan - 07/13/20 1751     Clinical Impression Statement Great improvment in ROM especially extension, much better control, she has difficulty with this due to c/o pain with extension.  We practiced walking with SPC, required HHA and some verbal cues.  Quads are improving but still very weak    PT Next Visit Plan continue to progress, ROM, strength and function    Consulted and Agree with Plan of Care Patient             Patient will benefit from skilled therapeutic intervention in order to improve the following deficits and impairments:  Abnormal gait, Decreased range of motion, Difficulty walking, Decreased activity tolerance, Pain, Impaired flexibility, Decreased balance, Decreased mobility, Decreased strength, Increased edema, Decreased scar mobility  Visit Diagnosis: Acute pain of left knee  Knee stiff, left  Difficulty in walking, not elsewhere classified  Localized edema     Problem List Patient Active Problem List   Diagnosis Date Noted   S/P left unicompartmental knee replacement, lateral 06/29/2020   At risk for obstructive sleep apnea 06/11/2019   Cough 06/11/2019   Multifocal pneumonia    Acute respiratory failure with hypoxia ([place]) 05/26/2019   Hyperglycemia 05/26/2019   Acute respiratory failure with hypoxemia ([place]) 05/23/2019   Acute hypoxemic respiratory failure ([place]) 05/20/2019   Fibromyalgia 03/09/2019   Neck pain, chronic 03/09/2019   Acute shoulder bursitis, right 02/03/2019   Bile salt-induced diarrhea 09/25/2018   Arthritis of left acromioclavicular joint 09/23/2018   Precordial chest pain 08/11/2018   Morbid obesity ([place]) 10/31/2017   S/P right UKR, lateral 10/30/2017   Incarcerated epigastric hernia 05/30/2015   Left foot pain 08/09/2014   Chronic diarrhea 07/27/2014   Gout 08/10/2012   Acid reflux 01/22/2012   Down syndrome 11/11/2011   Hernia of abdominal  wall 10/21/2011   Psoriasis 10/21/2011   Type 2  diabetes mellitus, uncontrolled ([place]) 04/12/2011   Hypothyroid 04/12/2011   Gait abnormality 06/22/2010   Tibial tendinitis, posterior 06/22/2010    Sumner Boast., PT 07/13/2020, 5:54 PM  [place]. [place], [place], [address] Phone: [phone]   Fax:  [phone]  Name: Wanda Oneill MRN: [id number] Date of Birth: 29-Aug-1980

## 2020-07-18 ENCOUNTER — Ambulatory Visit: Payer: Medicare HMO | Admitting: Physical Therapy

## 2020-07-18 ENCOUNTER — Other Ambulatory Visit: Payer: Self-pay

## 2020-07-18 DIAGNOSIS — R262 Difficulty in walking, not elsewhere classified: Secondary | ICD-10-CM | POA: Diagnosis not present

## 2020-07-18 DIAGNOSIS — M25562 Pain in left knee: Secondary | ICD-10-CM | POA: Diagnosis not present

## 2020-07-18 DIAGNOSIS — M25662 Stiffness of left knee, not elsewhere classified: Secondary | ICD-10-CM | POA: Diagnosis not present

## 2020-07-18 DIAGNOSIS — R600 Localized edema: Secondary | ICD-10-CM

## 2020-07-18 NOTE — Therapy (Signed)
[place]. [place], [place], [address] Phone: [phone]   Fax:  [phone]  Physical Therapy Treatment  Patient Details  Name: Wanda Oneill MRN: [id number] Date of Birth: 1980/12/23 Referring Provider (PT): Alvan Dame   Encounter Date: 07/18/2020   PT End of Session - 07/18/20 0762     Visit Number 5    Date for PT Re-Evaluation 08/18/20    PT Start Time 2633    PT Stop Time 3545    PT Time Calculation (min) 59 min             Past Medical History:  Diagnosis Date   Diabetes mellitus    type II   Down syndrome    Family history of adverse reaction to anesthesia    mom has had n/v   Gastritis    Headache    Hepatic steatosis    Hidradenitis    Hypothyroidism    Irritable bowel syndrome    Periumbilical hernia    Pneumonia 03/2020   frequent    Restless legs    Tendonitis    chronic in left foot   Thyroid disease    hypothyroidism    Past Surgical History:  Procedure Laterality Date   AXILLARY HIDRADENITIS EXCISION Bilateral    CHOLECYSTECTOMY     HERNIA REPAIR     multiple   INCISIONAL HERNIA REPAIR  05/30/2015   LAPROSCOPIC   INCISIONAL HERNIA REPAIR N/A 05/30/2015   Procedure: LAPAROSCOPIC INCISIONAL HERNIA;  Surgeon: Rolm Bookbinder, MD;  Location: [place];  Service: General;  Laterality: N/A;   INSERTION OF MESH N/A 05/30/2015   Procedure: INSERTION OF MESH;  Surgeon: Rolm Bookbinder, MD;  Location: [place];  Service: General;  Laterality: N/A;   KNEE ARTHROSCOPY     right knee   LAPAROSCOPY N/A 05/30/2015   Procedure: LAPAROSCOPY DIAGNOSTIC;  Surgeon: Rolm Bookbinder, MD;  Location: [place];  Service: General;  Laterality: N/A;   PARTIAL KNEE ARTHROPLASTY Right 10/30/2017   Procedure: UNICOMPARTMENTAL RIGHT KNEE LATERAL;  Surgeon: Paralee Cancel, MD;  [facility];  Service: Orthopedics;  Laterality: Right;  90 mins   PARTIAL KNEE ARTHROPLASTY Left 06/29/2020   Procedure:  UNICOMPARTMENTAL KNEE LATERALLY;  Surgeon: Paralee Cancel, MD;  [facility];  Service: Orthopedics;  Laterality: Left;  70 MINS   TONSILLECTOMY     adenoids    There were no vitals filed for this visit.   Subjective Assessment - 07/18/20 1147     Subjective went home yesterday and did good. trying to get off walker but knee pops and makes me nervous to fall    Currently in Pain? Yes    Pain Score 6     Pain Location Knee    Pain Orientation Left                [facility] PT Assessment - 07/18/20 0001       AROM   AROM Assessment Site Knee    Right/Left Knee Left    Left Knee Extension 6    Left Knee Flexion 100                           [facility] Adult PT Treatment/Exercise - 07/18/20 0001       Knee/Hip Exercises: Aerobic   Recumbent Bike 5 min L 1    Nustep L 4 6 min   LE only  Knee/Hip Exercises: Standing   Forward Step Up Left;10 reps;Hand Hold: 2;Step Height: 4"   SBA with RW and cuing     Knee/Hip Exercises: Seated   Long Arc Quad Left;Strengthening;4 sets;5 reps    Long Arc Quad Weight 2 lbs.    Other Seated Knee/Hip Exercises TKE red tband 2 sets 10    Hamstring Curl Strengthening;Left;2 sets;10 reps   red tband   Sit to Sand 2 sets;5 reps;without UE support      Vasopneumatic   Number Minutes Vasopneumatic  10 minutes    Vasopnuematic Location  Knee    Vasopneumatic Pressure Medium    Vasopneumatic Temperature  36                      PT Short Term Goals - 07/13/20 1753       PT SHORT TERM GOAL #1   Title independent with initial HEP    Status Achieved               PT Long Term Goals - 07/13/20 1754       PT LONG TERM GOAL #1   Title understand RICE    Status Achieved                   Plan - 07/18/20 1236     Clinical Impression Statement full rev on the bike and added increased TKE ex and did well- initial c/o pain but then better with more reps. pt with heavy use of UE with step up . pt  would like to work on getting off walker but fearfull,explained as quad strength comes it will get easier. Pt with improved AROM    PT Treatment/Interventions ADLs/Self Care Home Management;Cryotherapy;Electrical Stimulation;Ultrasound;Gait training;Neuromuscular re-education;Balance training;Therapeutic exercise;Therapeutic activities;Stair training;Functional mobility training;Patient/family education;Manual techniques;Vasopneumatic Device    PT Next Visit Plan continue to progress, ROM, strength and function. Gait with SPC             Patient will benefit from skilled therapeutic intervention in order to improve the following deficits and impairments:  Abnormal gait, Decreased range of motion, Difficulty walking, Decreased activity tolerance, Pain, Impaired flexibility, Decreased balance, Decreased mobility, Decreased strength, Increased edema, Decreased scar mobility  Visit Diagnosis: Knee stiff, left  Acute pain of left knee  Difficulty in walking, not elsewhere classified  Localized edema     Problem List Patient Active Problem List   Diagnosis Date Noted   S/P left unicompartmental knee replacement, lateral 06/29/2020   At risk for obstructive sleep apnea 06/11/2019   Cough 06/11/2019   Multifocal pneumonia    Acute respiratory failure with hypoxia ([place]) 05/26/2019   Hyperglycemia 05/26/2019   Acute respiratory failure with hypoxemia ([place]) 05/23/2019   Acute hypoxemic respiratory failure ([place]) 05/20/2019   Fibromyalgia 03/09/2019   Neck pain, chronic 03/09/2019   Acute shoulder bursitis, right 02/03/2019   Bile salt-induced diarrhea 09/25/2018   Arthritis of left acromioclavicular joint 09/23/2018   Precordial chest pain 08/11/2018   Morbid obesity ([place]) 10/31/2017   S/P right UKR, lateral 10/30/2017   Incarcerated epigastric hernia 05/30/2015   Left foot pain 08/09/2014   Chronic diarrhea 07/27/2014   Gout 08/10/2012   Acid reflux 01/22/2012   Down syndrome  11/11/2011   Hernia of abdominal wall 10/21/2011   Psoriasis 10/21/2011   Type 2 diabetes mellitus, uncontrolled ([place]) 04/12/2011   Hypothyroid 04/12/2011   Gait abnormality 06/22/2010   Tibial tendinitis, posterior 06/22/2010    Aubryana Vittorio,ANGIE PTA  07/18/2020, 12:39 PM  [place]. [place], [place], [address] Phone: [phone]   Fax:  [phone]  Name: Wanda Oneill MRN: [id number] Date of Birth: 1980/06/27

## 2020-07-20 ENCOUNTER — Other Ambulatory Visit: Payer: Self-pay

## 2020-07-20 ENCOUNTER — Ambulatory Visit: Payer: Medicare HMO | Admitting: Physical Therapy

## 2020-07-20 DIAGNOSIS — M25662 Stiffness of left knee, not elsewhere classified: Secondary | ICD-10-CM

## 2020-07-20 DIAGNOSIS — R600 Localized edema: Secondary | ICD-10-CM

## 2020-07-20 DIAGNOSIS — M25562 Pain in left knee: Secondary | ICD-10-CM | POA: Diagnosis not present

## 2020-07-20 DIAGNOSIS — R262 Difficulty in walking, not elsewhere classified: Secondary | ICD-10-CM

## 2020-07-20 NOTE — Therapy (Signed)
[facility] [place], [place], [address] Phone: [phone]   Fax:  [phone]  Physical Therapy Treatment  Patient Details  Name: Wanda Oneill MRN: [id number] Date of Birth: [birth year] Referring Provider (PT): Alvan Dame   Encounter Date: 07/20/2020   PT End of Session - 07/20/20 1702     Visit Number 6    Date for PT Re-Evaluation 08/18/20    Authorization Type [employer]    PT Start Time 1620    PT Stop Time 2197    PT Time Calculation (min) 55 min             Past Medical History:  Diagnosis Date   Diabetes mellitus    type II   Down syndrome    Family history of adverse reaction to anesthesia    mom has had n/v   Gastritis    Headache    Hepatic steatosis    Hidradenitis    Hypothyroidism    Irritable bowel syndrome    Periumbilical hernia    Pneumonia 03/2020   frequent    Restless legs    Tendonitis    chronic in left foot   Thyroid disease    hypothyroidism    Past Surgical History:  Procedure Laterality Date   AXILLARY HIDRADENITIS EXCISION Bilateral    CHOLECYSTECTOMY     HERNIA REPAIR     multiple   INCISIONAL HERNIA REPAIR  05/30/2015   LAPROSCOPIC   INCISIONAL HERNIA REPAIR N/A 05/30/2015   Procedure: LAPAROSCOPIC INCISIONAL HERNIA;  Surgeon: Rolm Bookbinder, MD;  Location: [facility];  Service: General;  Laterality: N/A;   INSERTION OF MESH N/A 05/30/2015   Procedure: INSERTION OF MESH;  Surgeon: Rolm Bookbinder, MD;  Location: [facility];  Service: General;  Laterality: N/A;   KNEE ARTHROSCOPY     right knee   LAPAROSCOPY N/A 05/30/2015   Procedure: LAPAROSCOPY DIAGNOSTIC;  Surgeon: Rolm Bookbinder, MD;  Location: [facility];  Service: General;  Laterality: N/A;   PARTIAL KNEE ARTHROPLASTY Right 10/30/2017   Procedure: UNICOMPARTMENTAL RIGHT KNEE LATERAL;  Surgeon: Paralee Cancel, MD;  Location: [facility];  Service: Orthopedics;  Laterality: Right;  90 mins   PARTIAL KNEE ARTHROPLASTY Left  06/29/2020   Procedure: UNICOMPARTMENTAL KNEE LATERALLY;  Surgeon: Paralee Cancel, MD;  Location: [facility];  Service: Orthopedics;  Laterality: Left;  70 MINS   TONSILLECTOMY     adenoids    There were no vitals filed for this visit.   Subjective Assessment - 07/20/20 1622     Subjective walking more at home without walker so my hands are free but need to hold counters or something    Currently in Pain? Yes    Pain Score 5     Pain Location Knee    Pain Orientation Left                               [facility] Adult PT Treatment/Exercise - 07/20/20 0001       Ambulation/Gait   Gait Comments amb with SPC 150 feet SBA, cued to increase stride as pt gait is slow      Knee/Hip Exercises: Aerobic   Nustep L 5 28mn LE only      Knee/Hip Exercises: Machines for Strengthening   Cybex Knee Extension 5# up with 2 down with left with assistance    Cybex Knee Flexion 25# 2 sets 10  Knee/Hip Exercises: Standing   Walking with Sports Cord 30# fwd/back 5 x and 2 x each side with SPC   min A and cuing     Knee/Hip Exercises: Seated   Long Arc Quad Left;Strengthening;4 sets;5 reps    Long Arc Quad Weight 2 lbs.      Vasopneumatic   Number Minutes Vasopneumatic  10 minutes    Vasopnuematic Location  Knee    Vasopneumatic Pressure Medium    Vasopneumatic Temperature  36                      PT Short Term Goals - 07/13/20 1753       PT SHORT TERM GOAL #1   Title independent with initial HEP    Status Achieved               PT Long Term Goals - 07/20/20 1701       PT LONG TERM GOAL #2   Title dcrease pain 50%    Status Partially Met      PT LONG TERM GOAL #3   Title increase left knee AROM to 0-120 degrees flexion    Status Partially Met      PT LONG TERM GOAL #4   Title walk on all surfaces without limp and with minimal; pain    Status Partially Met                   Plan - 07/20/20 1702     Clinical Impression Statement  pt is progressing well. worked on gait with SPC 150 feet plus amb btwn machines , cued to increase stride as pt is slow and hesitant . quad weakness still noted attempted knee ext machine but weak and pain limited    PT Treatment/Interventions ADLs/Self Care Home Management;Cryotherapy;Electrical Stimulation;Ultrasound;Gait training;Neuromuscular re-education;Balance training;Therapeutic exercise;Therapeutic activities;Stair training;Functional mobility training;Patient/family education;Manual techniques;Vasopneumatic Device    PT Next Visit Plan continue to progress, ROM, strength and function. Gait with [place]             Patient will benefit from skilled therapeutic intervention in order to improve the following deficits and impairments:  Abnormal gait, Decreased range of motion, Difficulty walking, Decreased activity tolerance, Pain, Impaired flexibility, Decreased balance, Decreased mobility, Decreased strength, Increased edema, Decreased scar mobility  Visit Diagnosis: Knee stiff, left  Acute pain of left knee  Difficulty in walking, not elsewhere classified  Localized edema     Problem List Patient Active Problem List   Diagnosis Date Noted   S/P left unicompartmental knee replacement, lateral 06/29/2020   At risk for obstructive sleep apnea 06/11/2019   Cough 06/11/2019   Multifocal pneumonia    Acute respiratory failure with hypoxia ([facility]) 05/26/2019   Hyperglycemia 05/26/2019   Acute respiratory failure with hypoxemia ([place]) 05/23/2019   Acute hypoxemic respiratory failure ([facility]) 05/20/2019   Fibromyalgia 03/09/2019   Neck pain, chronic 03/09/2019   Acute shoulder bursitis, right 02/03/2019   Bile salt-induced diarrhea 09/25/2018   Arthritis of left acromioclavicular joint 09/23/2018   Precordial chest pain 08/11/2018   Morbid obesity ([place]) 10/31/2017   S/P right UKR, lateral 10/30/2017   Incarcerated epigastric hernia 05/30/2015   Left foot pain 08/09/2014   Chronic  diarrhea 07/27/2014   Gout 08/10/2012   Acid reflux 01/22/2012   Down syndrome 11/11/2011   Hernia of abdominal wall 10/21/2011   Psoriasis 10/21/2011   Type 2 diabetes mellitus, uncontrolled ([place]) 04/12/2011   Hypothyroid 04/12/2011   Gait  abnormality 06/22/2010   Tibial tendinitis, posterior 06/22/2010    Sherrie Marsan,ANGIE PTA 07/20/2020, 5:05 PM  [facility]. [place], [place], [address] Phone: [phone]   Fax:  [phone]  Name: MINKA KNIGHT MRN: [id number] Date of Birth: 1980-02-21

## 2020-07-22 ENCOUNTER — Emergency Department (HOSPITAL_COMMUNITY)
Admission: EM | Admit: 2020-07-22 | Discharge: 2020-07-22 | Disposition: A | Discharge status: Home / Self Care | Payer: Medicare HMO | Attending: Emergency Medicine | Admitting: Emergency Medicine

## 2020-07-22 ENCOUNTER — Encounter (HOSPITAL_COMMUNITY): Payer: Self-pay | Admitting: *Deleted

## 2020-07-22 ENCOUNTER — Emergency Department (HOSPITAL_BASED_OUTPATIENT_CLINIC_OR_DEPARTMENT_OTHER): Payer: Medicare HMO

## 2020-07-22 DIAGNOSIS — E119 Type 2 diabetes mellitus without complications: Secondary | ICD-10-CM | POA: Diagnosis not present

## 2020-07-22 DIAGNOSIS — Z79899 Other long term (current) drug therapy: Secondary | ICD-10-CM | POA: Diagnosis not present

## 2020-07-22 DIAGNOSIS — E039 Hypothyroidism, unspecified: Secondary | ICD-10-CM | POA: Diagnosis not present

## 2020-07-22 DIAGNOSIS — Z7984 Long term (current) use of oral hypoglycemic drugs: Secondary | ICD-10-CM | POA: Diagnosis not present

## 2020-07-22 DIAGNOSIS — M7989 Other specified soft tissue disorders: Secondary | ICD-10-CM | POA: Diagnosis not present

## 2020-07-22 DIAGNOSIS — M79662 Pain in left lower leg: Secondary | ICD-10-CM

## 2020-07-22 DIAGNOSIS — M79605 Pain in left leg: Secondary | ICD-10-CM

## 2020-07-22 DIAGNOSIS — Z96653 Presence of artificial knee joint, bilateral: Secondary | ICD-10-CM | POA: Diagnosis not present

## 2020-07-22 DIAGNOSIS — Z7982 Long term (current) use of aspirin: Secondary | ICD-10-CM | POA: Insufficient documentation

## 2020-07-22 NOTE — ED Triage Notes (Signed)
Pt reports pain in left calf x 1 week. She had knee surgery on 5/26.

## 2020-07-22 NOTE — Progress Notes (Signed)
LLE venous duplex has been completed.  Suella Broad, PA has been given preliminary results.  Results can be found under chart review under CV PROC. 07/22/2020 4:05 PM Juliahna Wiswell RVT, RDMS

## 2020-07-22 NOTE — Discharge Instructions (Addendum)
Your ultrasound today does not show a blood clot in your leg.  Continue with your PT program as planned.

## 2020-07-22 NOTE — ED Provider Notes (Signed)
[facility] DEPT Provider Note   CSN: [id number] Arrival date & time: 07/22/20  1125     History Chief Complaint  Patient presents with   Leg Pain    Wanda Oneill is a 40 y.o. female.   Wanda Oneill , a 40 y.o. female  was evaluated in triage.  Pt complains of left calf pain, had knee surgery 3 weeks ago, calf pain x 1 week. Takes 43m ASA daily. Denies SHOB.  No personal or family history of PE or DVT.        Past Medical History:  Diagnosis Date   Diabetes mellitus    type II   Down syndrome    Family history of adverse reaction to anesthesia    mom has had n/v   Gastritis    Headache    Hepatic steatosis    Hidradenitis    Hypothyroidism    Irritable bowel syndrome    Periumbilical hernia    Pneumonia 03/2020   frequent    Restless legs    Tendonitis    chronic in left foot   Thyroid disease    hypothyroidism    Patient Active Problem List   Diagnosis Date Noted   S/P left unicompartmental knee replacement, lateral 06/29/2020   At risk for obstructive sleep apnea 06/11/2019   Cough 06/11/2019   Multifocal pneumonia    Acute respiratory failure with hypoxia ([place] [place] 05/26/2019   Hyperglycemia 05/26/2019   Acute respiratory failure with hypoxemia ([facility] 05/23/2019   Acute hypoxemic respiratory failure ([place] 05/20/2019   Fibromyalgia 03/09/2019   Neck pain, chronic 03/09/2019   Acute shoulder bursitis, right 02/03/2019   Bile salt-induced diarrhea 09/25/2018   Arthritis of left acromioclavicular joint 09/23/2018   Precordial chest pain 08/11/2018   Morbid obesity ([facility] 10/31/2017   S/P right UKR, lateral 10/30/2017   Incarcerated epigastric hernia 05/30/2015   Left foot pain 08/09/2014   Chronic diarrhea 07/27/2014   Gout 08/10/2012   Acid reflux 01/22/2012   Down syndrome 11/11/2011   Hernia of abdominal wall 10/21/2011   Psoriasis 10/21/2011   Type 2 diabetes mellitus, uncontrolled ([place] 04/12/2011   Hypothyroid  04/12/2011   Gait abnormality 06/22/2010   Tibial tendinitis, posterior 06/22/2010    Past Surgical History:  Procedure Laterality Date   AXILLARY HIDRADENITIS EXCISION Bilateral    CHOLECYSTECTOMY     HERNIA REPAIR     multiple   INCISIONAL HERNIA REPAIR  05/30/2015   LAPROSCOPIC   INCISIONAL HERNIA REPAIR N/A 05/30/2015   Procedure: LAPAROSCOPIC INCISIONAL HERNIA;  Surgeon: MRolm Bookbinder MD;  [facility];  Service: General;  Laterality: N/A;   INSERTION OF MESH N/A 05/30/2015   Procedure: INSERTION OF MESH;  Surgeon: MRolm Bookbinder MD;  [facility];  Service: General;  Laterality: N/A;   KNEE ARTHROSCOPY     right knee   LAPAROSCOPY N/A 05/30/2015   Procedure: LAPAROSCOPY DIAGNOSTIC;  Surgeon: MRolm Bookbinder MD;  [facility]  Service: General;  Laterality: N/A;   PARTIAL KNEE ARTHROPLASTY Right 10/30/2017   Procedure: UNICOMPARTMENTAL RIGHT KNEE LATERAL;  Surgeon: OParalee Cancel MD;  [facility];  Service: Orthopedics;  Laterality: Right;  90 mins   PARTIAL KNEE ARTHROPLASTY Left 06/29/2020   Procedure: UNICOMPARTMENTAL KNEE LATERALLY;  Surgeon: OParalee Cancel MD;  [facility];  Service: Orthopedics;  Laterality: Left;  70 MINS   TONSILLECTOMY     adenoids     OB History   No obstetric history on  file.     Family History  Problem Relation Age of Onset   Thyroid cancer Mother    Diabetes type II Mother    High Cholesterol Mother    Heart disease Mother    Diabetes type II Father    Hypertension Father    Stroke Father     Social History   Tobacco Use   Smoking status: Never   Smokeless tobacco: Never  Vaping Use   Vaping Use: Never used  Substance Use Topics   Alcohol use: No    Alcohol/week: 0.0 standard drinks   Drug use: No    Home Medications Prior to Admission medications   Medication Sig Start Date End Date Taking? Authorizing Provider  acetaminophen (TYLENOL) 325 MG tablet Take 650 mg by mouth every 6 (six) hours as  needed for mild pain.     [provider]  albuterol (VENTOLIN HFA) 108 (90 Base) MCG/ACT inhaler Inhale 2 puffs into the lungs every 4 (four) hours as needed for wheezing or shortness of breath. 05/12/20   Scot Jun, FNP  allopurinol (ZYLOPRIM) 300 MG tablet Take 1 tablet by mouth once daily 07/12/20   Copland, Gay Filler, MD  aspirin 81 MG chewable tablet Chew 1 tablet (81 mg total) by mouth 2 (two) times daily for 28 days. 06/30/20 07/28/20  Irving Copas, PA-C  benzonatate (TESSALON) 100 MG capsule Take 1-2 capsules (100-200 mg total) by mouth 3 (three) times daily as needed for cough. 05/12/20   Scot Jun, FNP  Blood Glucose Monitoring Suppl (BLOOD GLUCOSE METER KIT AND SUPPLIES) Dispense based on patient and insurance preference. To check blood sugars daily FOR ICD-9 250.00 Patient taking differently: 1 each by Other route See admin instructions. Dispense based on patient and insurance preference. To check blood sugars daily FOR ICD-9 250.00 08/31/13   Darlyne Russian, MD  Blood Glucose Monitoring Suppl (TRUE METRIX METER) w/Device KIT Use as directed to check glucose up to two times daily. Dx Code E11.9 08/16/19   Copland, Gay Filler, MD  cetirizine (ZYRTEC) 10 MG tablet Take 10 mg by mouth daily as needed for allergies.     [provider]  cholestyramine (QUESTRAN) 4 g packet DISSOLVE & TAKE 1 PACKET OF POWDER BY MOUTH TWICE DAILY Patient taking differently: Take 4 g by mouth 2 (two) times daily. 01/05/20   Copland, Gay Filler, MD  clobetasol cream (TEMOVATE) 9.44 % Apply 1 application topically daily as needed (for eczema).  10/07/12   [provider]  dicyclomine (BENTYL) 20 MG tablet Take 20 mg by mouth daily as needed for spasms.    [provider]  docusate sodium (COLACE) 100 MG capsule Take 1 capsule (100 mg total) by mouth 2 (two) times daily. 06/30/20   Irving Copas, PA-C  empagliflozin (JARDIANCE) 25 MG TABS tablet Take 25 mg by mouth  daily.    [provider]  fluconazole (DIFLUCAN) 150 MG tablet Take 150 mg by mouth every 7 (seven) days. 05/03/20   [provider]  fluticasone (FLOVENT HFA) 110 MCG/ACT inhaler Inhale 1 puff into the lungs in the morning and at bedtime. 05/22/20   Copland, Gay Filler, MD  Glucose Blood (BLOOD GLUCOSE TEST STRIPS) STRP 1 each by Other route See admin instructions. Use to test blood sugar up to twice a day 08/13/19   Copland, Gay Filler, MD  glucose blood (FREESTYLE TEST STRIPS) test strip Use as directed to test blood sugar up to twice  a day. Dx code E11.9 08/16/19   Copland, Gay Filler, MD  glucose blood (TRUE METRIX BLOOD GLUCOSE TEST) test strip Use as instructed to check glucose up to 2 times daily. Dx Code E11.9 08/16/19   Copland, Gay Filler, MD  HYDROcodone-acetaminophen (NORCO/VICODIN) 5-325 MG tablet Take 1-2 tablets by mouth every 6 (six) hours as needed for severe pain. 06/30/20   Irving Copas, PA-C  levonorgestrel-ethinyl estradiol (JOLESSA) 0.15-0.03 MG tablet Take 1 tablet by mouth daily. 11/03/19   Copland, Gay Filler, MD  levothyroxine (EUTHYROX) 137 MCG tablet Take 1 tablet (137 mcg total) by mouth daily before breakfast. 06/14/19   Copland, Gay Filler, MD  meloxicam (MOBIC) 15 MG tablet Take 1 tablet (15 mg total) by mouth daily. 06/30/20   Irving Copas, PA-C  metFORMIN (GLUCOPHAGE) 1000 MG tablet Take 1,000 mg by mouth 2 (two) times daily.  01/16/16   [provider]  methocarbamol (ROBAXIN) 500 MG tablet Take 1 tablet (500 mg total) by mouth every 6 (six) hours as needed for muscle spasms. 06/30/20   Irving Copas, PA-C  ondansetron (ZOFRAN) 4 MG tablet Take 4 mg by mouth every 8 (eight) hours as needed for nausea/vomiting.  05/20/19   [provider]  ONE TOUCH ULTRA TEST test strip USE TO CHECK BLOOD SUGAR ONCE DAILY (ICD CODE:25.00) Patient taking differently: 1 each by Other route daily. 05/08/13   Gale Journey, Damaris Hippo, PA-C  Semaglutide,0.25 or  0.5MG/DOS, 2 MG/1.5ML SOPN Inject 0.5 mg into the skin every Saturday.    [provider]  TRUEplus Lancets 30G MISC Use as directed to check glucose up to two times daily. Dx code E11.9 08/16/19   Copland, Gay Filler, MD  TRULICITY 1.5 LK/9.1PH SOPN Inject 1.5 mg into the skin every Saturday. 04/10/20   [provider]  VASCEPA 1 g capsule Take 2 g by mouth 2 (two) times daily. 04/04/20   [provider]    Allergies    Vancomycin, Cefuroxime axetil, and Sulfa antibiotics  Review of Systems   Review of Systems  Constitutional:  Negative for chills, diaphoresis and fever.  Musculoskeletal:  Positive for myalgias. Negative for arthralgias and joint swelling.  Skin:  Negative for color change, rash and wound.  Allergic/Immunologic: Positive for immunocompromised state.  Neurological:  Negative for weakness and numbness.  Hematological:  Does not bruise/bleed easily.  All other systems reviewed and are negative.  Physical Exam Updated Vital Signs BP 119/79   Pulse 86   Temp 98 F (36.7 C) (Oral)   Resp 16   SpO2 100%   Physical Exam Vitals and nursing note reviewed.  Constitutional:      General: She is not in acute distress.    Appearance: She is well-developed. She is not diaphoretic.  HENT:     Head: Normocephalic and atraumatic.  Cardiovascular:     Pulses: Normal pulses.  Pulmonary:     Effort: Pulmonary effort is normal.  Musculoskeletal:        General: Swelling present. No tenderness, deformity or signs of injury.  Skin:    General: Skin is warm and dry.     Findings: No erythema or rash.  Neurological:     Mental Status: She is alert and oriented to person, place, and time.     Sensory: No sensory deficit.     Motor: No weakness.  Psychiatric:        Behavior: Behavior normal.    ED Results / Procedures / Treatments  Labs (all labs ordered are listed, but only abnormal results are displayed) Labs Reviewed - No data to  display  EKG None  Radiology VAS [place] LOWER EXTREMITY VENOUS (DVT) ([facility] 7a-7p)  Result Date: 07/22/2020  Lower Venous DVT Study Patient Name:  Wanda Oneill  Date of Exam:   07/22/2020 Medical Rec #: [id number]       Accession #:    [id number] Date of Birth: 1980-09-06      Patient Gender: F Patient Age:   18Y Exam [facility]  [facility] Procedure:      VAS [place] LOWER EXTREMITY VENOUS (DVT) Referring Phys: [id number] Fumi Guadron A Favor Hackler --------------------------------------------------------------------------------  Indications: Left calf pain x1 week.  Risk Factors: Surgery (LLE knee) 3 weeks ago. Comparison Study: No previous exams Performing Technologist: Jody Hill RVT, RDMS  Examination Guidelines: A complete evaluation includes B-mode imaging, spectral Doppler, color Doppler, and power Doppler as needed of all accessible portions of each vessel. Bilateral testing is considered an integral part of a complete examination. Limited examinations for reoccurring indications may be performed as noted. The reflux portion of the exam is performed with the patient in reverse Trendelenburg.  +-----+---------------+---------+-----------+----------+--------------+ RIGHTCompressibilityPhasicitySpontaneityPropertiesThrombus Aging +-----+---------------+---------+-----------+----------+--------------+ CFV  Full           Yes      Yes                                 +-----+---------------+---------+-----------+----------+--------------+   +---------+---------------+---------+-----------+----------+--------------+ LEFT     CompressibilityPhasicitySpontaneityPropertiesThrombus Aging +---------+---------------+---------+-----------+----------+--------------+ CFV      Full           Yes      Yes                                 +---------+---------------+---------+-----------+----------+--------------+ SFJ      Full                                                         +---------+---------------+---------+-----------+----------+--------------+ FV Prox  Full           Yes      Yes                                 +---------+---------------+---------+-----------+----------+--------------+ FV Mid   Full           Yes      Yes                                 +---------+---------------+---------+-----------+----------+--------------+ FV DistalFull           Yes      Yes                                 +---------+---------------+---------+-----------+----------+--------------+ PFV      Full                                                        +---------+---------------+---------+-----------+----------+--------------+  POP      Full           Yes      Yes                                 +---------+---------------+---------+-----------+----------+--------------+ PTV      Full                                                        +---------+---------------+---------+-----------+----------+--------------+ PERO     Full                                                        +---------+---------------+---------+-----------+----------+--------------+     Summary: RIGHT: - No evidence of common femoral vein obstruction.  LEFT: - There is no evidence of deep vein thrombosis in the lower extremity. - There is no evidence of superficial venous thrombosis.  - No cystic structure found in the popliteal fossa. - Ultrasound characteristics of enlarged lymph nodes noted in the groin.  *See table(s) above for measurements and observations.    Preliminary     Procedures Procedures   Medications Ordered in ED Medications - No data to display  ED Course  I have reviewed the triage vital signs and the nursing notes.  Pertinent labs & imaging results that were available during my care of the patient were reviewed by me and considered in my medical decision making (see chart for details).  Clinical Course as of 07/22/20 2148  Sat Jul 23, 6842   9960 40 year old female with complaint of left calf pain post knee surgery a few weeks ago.  On exam, does seem to have mild swelling of the left leg, no calf tenderness, no palpable cords.  DP pulse present, sensation intact. Dopplers negative for DVT.  Discussed results with patient and her mother who verbalized understanding of plan is to continue with her PT program as planned. [LM]    Clinical Course User Index [LM] Roque Lias   MDM Rules/Calculators/A&P                           Final Clinical Impression(s) / ED Diagnoses Final diagnoses:  Left leg pain    Rx / DC Orders ED Discharge Orders     None        Roque Lias 07/22/20 2148    Milton Ferguson, MD 07/24/20 [phone]

## 2020-07-22 NOTE — ED Provider Notes (Signed)
Emergency Medicine Provider Triage Evaluation Note  Wanda Oneill , a 40 y.o. female  was evaluated in triage.  Pt complains of left calf pain, had knee surgery 3 weeks ago, calf pain x 1 week. Takes 81mg  ASA daily. Denies SHOB.   Review of Systems  Positive: Calf pain, swelling Negative: Fevers Physical Exam  BP 119/78 (BP Location: Left Arm)   Pulse 88   Temp 98 F (36.7 C) (Oral)   Resp 16   SpO2 100%  Gen:   Awake, no distress   Resp:  Normal effort  MSK:   Moves extremities without difficulty  Other:  No calf tenderness or palpable cord. Mild swelling, postop site healing well.  Medical Decision Making  Medically screening exam initiated at 12:13 PM.  Appropriate orders placed.  MAVEN ROSANDER was informed that the remainder of the evaluation will be completed by another provider, this initial triage assessment does not replace that evaluation, and the importance of remaining in the ED until their evaluation is complete.     Tacy Learn, PA-C 07/22/20 1218    Milton Ferguson, MD 07/24/20 [phone]

## 2020-07-25 ENCOUNTER — Ambulatory Visit: Payer: Medicare HMO | Admitting: Physical Therapy

## 2020-07-25 ENCOUNTER — Other Ambulatory Visit: Payer: Self-pay

## 2020-07-25 DIAGNOSIS — M25662 Stiffness of left knee, not elsewhere classified: Secondary | ICD-10-CM | POA: Diagnosis not present

## 2020-07-25 DIAGNOSIS — M25562 Pain in left knee: Secondary | ICD-10-CM | POA: Diagnosis not present

## 2020-07-25 DIAGNOSIS — R262 Difficulty in walking, not elsewhere classified: Secondary | ICD-10-CM | POA: Diagnosis not present

## 2020-07-25 DIAGNOSIS — R600 Localized edema: Secondary | ICD-10-CM | POA: Diagnosis not present

## 2020-07-25 NOTE — Therapy (Signed)
[place]. [place], [place], [address] Phone: [phone]   Fax:  [phone]  Physical Therapy Treatment  Patient Details  Name: Wanda Oneill MRN: [id number] Date of Birth: 01-21-1981 Referring Provider (PT): Alvan Dame   Encounter Date: 07/25/2020   PT End of Session - 07/25/20 8341     Visit Number 7    Date for PT Re-Evaluation 08/18/20    PT Start Time 9622    PT Stop Time 2979    PT Time Calculation (min) 55 min             Past Medical History:  Diagnosis Date   Diabetes mellitus    type II   Down syndrome    Family history of adverse reaction to anesthesia    mom has had n/v   Gastritis    Headache    Hepatic steatosis    Hidradenitis    Hypothyroidism    Irritable bowel syndrome    Periumbilical hernia    Pneumonia 03/2020   frequent    Restless legs    Tendonitis    chronic in left foot   Thyroid disease    hypothyroidism    Past Surgical History:  Procedure Laterality Date   AXILLARY HIDRADENITIS EXCISION Bilateral    CHOLECYSTECTOMY     HERNIA REPAIR     multiple   INCISIONAL HERNIA REPAIR  05/30/2015   LAPROSCOPIC   INCISIONAL HERNIA REPAIR N/A 05/30/2015   Procedure: LAPAROSCOPIC INCISIONAL HERNIA;  Surgeon: Rolm Bookbinder, MD;  Location: [facility];  Service: General;  Laterality: N/A;   INSERTION OF MESH N/A 05/30/2015   Procedure: INSERTION OF MESH;  Surgeon: Rolm Bookbinder, MD;  Location: [facility];  Service: General;  Laterality: N/A;   KNEE ARTHROSCOPY     right knee   LAPAROSCOPY N/A 05/30/2015   Procedure: LAPAROSCOPY DIAGNOSTIC;  Surgeon: Rolm Bookbinder, MD;  Location: [facility];  Service: General;  Laterality: N/A;   PARTIAL KNEE ARTHROPLASTY Right 10/30/2017   Procedure: UNICOMPARTMENTAL RIGHT KNEE LATERAL;  Surgeon: Paralee Cancel, MD;  Location: [facility];  Service: Orthopedics;  Laterality: Right;  90 mins   PARTIAL KNEE ARTHROPLASTY Left 06/29/2020   Procedure:  UNICOMPARTMENTAL KNEE LATERALLY;  Surgeon: Paralee Cancel, MD;  Location: [facility];  Service: Orthopedics;  Laterality: Left;  70 MINS   TONSILLECTOMY     adenoids    There were no vitals filed for this visit.   Subjective Assessment - 07/25/20 1154     Subjective pt amb in very slowly with SPC with mom. they report a fall over the weekend in parking lot and landing on both knes RT > Left. Limited activity since d/t increased pain.    Currently in Pain? Yes    Pain Score 7     Pain Location Knee    Pain Orientation Left                               [facility] Adult PT Treatment/Exercise - 07/25/20 0001       Knee/Hip Exercises: Aerobic   Nustep L 5 3mn LE only      Knee/Hip Exercises: Seated   Long Arc Quad Strengthening;Left;3 sets;10 reps    Other Seated Knee/Hip Exercises TKE red tband 2 sets 10    Hamstring Curl Strengthening;Left;2 sets;10 reps    Hamstring Limitations green tband      Electrical Stimulation  Electrical Stimulation Location Left knee    Electrical Stimulation Action IFC    Electrical Stimulation Parameters supine    Electrical Stimulation Goals Edema;Pain      Vasopneumatic   Number Minutes Vasopneumatic  15 minutes    Vasopnuematic Location  Knee    Vasopneumatic Pressure Medium    Vasopneumatic Temperature  34      Manual Therapy   Manual Therapy Passive ROM    Passive ROM flex/ext                      PT Short Term Goals - 07/13/20 1753       PT SHORT TERM GOAL #1   Title independent with initial HEP    Status Achieved               PT Long Term Goals - 07/20/20 1701       PT LONG TERM GOAL #2   Title dcrease pain 50%    Status Partially Met      PT LONG TERM GOAL #3   Title increase left knee AROM to 0-120 degrees flexion    Status Partially Met      PT LONG TERM GOAL #4   Title walk on all surfaces without limp and with minimal; pain    Status Partially Met                    Plan - 07/25/20 1228     Clinical Impression Statement pt amb in very slow with SPC and reports fall, examed knee and outside of BIL bruises,scaps and swelling doing okay. ROM very good, decreased quad activation. advised to go back on RW until next session.    PT Treatment/Interventions ADLs/Self Care Home Management;Cryotherapy;Electrical Stimulation;Ultrasound;Gait training;Neuromuscular re-education;Balance training;Therapeutic exercise;Therapeutic activities;Stair training;Functional mobility training;Patient/family education;Manual techniques;Vasopneumatic Device    PT Next Visit Plan continue to progress, ROM, strength and function. Gait with SPC             Patient will benefit from skilled therapeutic intervention in order to improve the following deficits and impairments:  Abnormal gait, Decreased range of motion, Difficulty walking, Decreased activity tolerance, Pain, Impaired flexibility, Decreased balance, Decreased mobility, Decreased strength, Increased edema, Decreased scar mobility  Visit Diagnosis: Knee stiff, left  Acute pain of left knee  Difficulty in walking, not elsewhere classified  Localized edema     Problem List Patient Active Problem List   Diagnosis Date Noted   S/P left unicompartmental knee replacement, lateral 06/29/2020   At risk for obstructive sleep apnea 06/11/2019   Cough 06/11/2019   Multifocal pneumonia    Acute respiratory failure with hypoxia ([facility]) 05/26/2019   Hyperglycemia 05/26/2019   Acute respiratory failure with hypoxemia ([place]) 05/23/2019   Acute hypoxemic respiratory failure ([facility]) 05/20/2019   Fibromyalgia 03/09/2019   Neck pain, chronic 03/09/2019   Acute shoulder bursitis, right 02/03/2019   Bile salt-induced diarrhea 09/25/2018   Arthritis of left acromioclavicular joint 09/23/2018   Precordial chest pain 08/11/2018   Morbid obesity ([place]) 10/31/2017   S/P right UKR, lateral 10/30/2017   Incarcerated epigastric hernia  05/30/2015   Left foot pain 08/09/2014   Chronic diarrhea 07/27/2014   Gout 08/10/2012   Acid reflux 01/22/2012   Down syndrome 11/11/2011   Hernia of abdominal wall 10/21/2011   Psoriasis 10/21/2011   Type 2 diabetes mellitus, uncontrolled ([facility]) 04/12/2011   Hypothyroid 04/12/2011   Gait abnormality 06/22/2010   Tibial tendinitis, posterior 06/22/2010  Tykira Wachs,ANGIE PTA 07/25/2020, 12:45 PM  [place]. [place], [place], [address] Phone: [phone]   Fax:  [phone]  Name: Wanda Oneill MRN: [id number] Date of Birth: 1980/11/24

## 2020-07-27 ENCOUNTER — Other Ambulatory Visit: Payer: Self-pay

## 2020-07-27 ENCOUNTER — Ambulatory Visit: Payer: Medicare HMO | Admitting: Physical Therapy

## 2020-07-27 DIAGNOSIS — R262 Difficulty in walking, not elsewhere classified: Secondary | ICD-10-CM

## 2020-07-27 DIAGNOSIS — R600 Localized edema: Secondary | ICD-10-CM | POA: Diagnosis not present

## 2020-07-27 DIAGNOSIS — M25662 Stiffness of left knee, not elsewhere classified: Secondary | ICD-10-CM | POA: Diagnosis not present

## 2020-07-27 DIAGNOSIS — M25562 Pain in left knee: Secondary | ICD-10-CM

## 2020-07-27 NOTE — Therapy (Signed)
[place]. [place], [place], [address] Phone: [phone]   Fax:  [phone]  Physical Therapy Treatment  Patient Details  Name: Wanda Oneill MRN: [id number] Date of Birth: 08-08-1980 Referring Provider (PT): Alvan Dame   Encounter Date: 07/27/2020   PT End of Session - 07/27/20 1219     Visit Number 8    Date for PT Re-Evaluation 08/18/20    PT Start Time 3295    PT Stop Time 1884    PT Time Calculation (min) 45 min             Past Medical History:  Diagnosis Date   Diabetes mellitus    type II   Down syndrome    Family history of adverse reaction to anesthesia    mom has had n/v   Gastritis    Headache    Hepatic steatosis    Hidradenitis    Hypothyroidism    Irritable bowel syndrome    Periumbilical hernia    Pneumonia 03/2020   frequent    Restless legs    Tendonitis    chronic in left foot   Thyroid disease    hypothyroidism    Past Surgical History:  Procedure Laterality Date   AXILLARY HIDRADENITIS EXCISION Bilateral    CHOLECYSTECTOMY     HERNIA REPAIR     multiple   INCISIONAL HERNIA REPAIR  05/30/2015   LAPROSCOPIC   INCISIONAL HERNIA REPAIR N/A 05/30/2015   Procedure: LAPAROSCOPIC INCISIONAL HERNIA;  Surgeon: Rolm Bookbinder, MD;  Location: [facility];  Service: General;  Laterality: N/A;   INSERTION OF MESH N/A 05/30/2015   Procedure: INSERTION OF MESH;  Surgeon: Rolm Bookbinder, MD;  Location: [facility];  Service: General;  Laterality: N/A;   KNEE ARTHROSCOPY     right knee   LAPAROSCOPY N/A 05/30/2015   Procedure: LAPAROSCOPY DIAGNOSTIC;  Surgeon: Rolm Bookbinder, MD;  Location: [facility];  Service: General;  Laterality: N/A;   PARTIAL KNEE ARTHROPLASTY Right 10/30/2017   Procedure: UNICOMPARTMENTAL RIGHT KNEE LATERAL;  Surgeon: Paralee Cancel, MD;  [facility];  Service: Orthopedics;  Laterality: Right;  90 mins   PARTIAL KNEE ARTHROPLASTY Left 06/29/2020   Procedure:  UNICOMPARTMENTAL KNEE LATERALLY;  Surgeon: Paralee Cancel, MD;  [facility];  Service: Orthopedics;  Laterality: Left;  70 MINS   TONSILLECTOMY     adenoids    There were no vitals filed for this visit.   Subjective Assessment - 07/27/20 1147     Subjective amb in with RW-moving better. pain better    Currently in Pain? Yes    Pain Score 5     Pain Location Knee    Pain Orientation Left                               [facility] Adult PT Treatment/Exercise - 07/27/20 0001       Knee/Hip Exercises: Aerobic   Recumbent Bike 5 min L 1    Nustep L 5 7mn LE only      Knee/Hip Exercises: Standing   Hip Flexion Stengthening;Left;1 set;10 reps;Knee bent;Knee straight    Hip Flexion Limitations 2#    Hip Abduction Stengthening;Left;10 reps    Abduction Limitations 2#    Other Standing Knee Exercises HS curl 2# 10 x      Knee/Hip Exercises: Seated   Long Arc Quad Strengthening;Left;2 sets;10 reps    Long Arc  Quad Weight 2 lbs.    Hamstring Curl Strengthening;Left;2 sets;10 reps    Hamstring Limitations green tband      Vasopneumatic   Number Minutes Vasopneumatic  15 minutes    Vasopnuematic Location  Knee    Vasopneumatic Pressure Medium    Vasopneumatic Temperature  34                      PT Short Term Goals - 07/13/20 1753       PT SHORT TERM GOAL #1   Title independent with initial HEP    Status Achieved               PT Long Term Goals - 07/27/20 1218       PT LONG TERM GOAL #2   Title dcrease pain 50%    Baseline set back with fall over weekend    Status Partially Met      PT LONG TERM GOAL #3   Title increase left knee AROM to 0-120 degrees flexion    Baseline still quad lag and trouble firing    Status Partially Met      PT LONG TERM GOAL #4   Title walk on all surfaces without limp and with minimal; pain    Baseline back on walker after fall    Status Partially Met                   Plan - 07/27/20  1219     Clinical Impression Statement pt moving better but on walker- advised to stay on until next week and then we will progress again-oakyed SPC in apartment if comfortable. Act flexion very good ,quad is weak and pt has difficulty firing- progressed ther ex today. progressing with goals    PT Treatment/Interventions ADLs/Self Care Home Management;Cryotherapy;Electrical Stimulation;Ultrasound;Gait training;Neuromuscular re-education;Balance training;Therapeutic exercise;Therapeutic activities;Stair training;Functional mobility training;Patient/family education;Manual techniques;Vasopneumatic Device    PT Next Visit Plan continue to progress, ROM, strength and function. Gait with [place]             Patient will benefit from skilled therapeutic intervention in order to improve the following deficits and impairments:  Abnormal gait, Decreased range of motion, Difficulty walking, Decreased activity tolerance, Pain, Impaired flexibility, Decreased balance, Decreased mobility, Decreased strength, Increased edema, Decreased scar mobility  Visit Diagnosis: Acute pain of left knee  Difficulty in walking, not elsewhere classified  Localized edema  Knee stiff, left     Problem List Patient Active Problem List   Diagnosis Date Noted   S/P left unicompartmental knee replacement, lateral 06/29/2020   At risk for obstructive sleep apnea 06/11/2019   Cough 06/11/2019   Multifocal pneumonia    Acute respiratory failure with hypoxia ([place]) 05/26/2019   Hyperglycemia 05/26/2019   Acute respiratory failure with hypoxemia ([place]) 05/23/2019   Acute hypoxemic respiratory failure ([place]) 05/20/2019   Fibromyalgia 03/09/2019   Neck pain, chronic 03/09/2019   Acute shoulder bursitis, right 02/03/2019   Bile salt-induced diarrhea 09/25/2018   Arthritis of left acromioclavicular joint 09/23/2018   Precordial chest pain 08/11/2018   Morbid obesity ([place]) 10/31/2017   S/P right UKR, lateral 10/30/2017    Incarcerated epigastric hernia 05/30/2015   Left foot pain 08/09/2014   Chronic diarrhea 07/27/2014   Gout 08/10/2012   Acid reflux 01/22/2012   Down syndrome 11/11/2011   Hernia of abdominal wall 10/21/2011   Psoriasis 10/21/2011   Type 2 diabetes mellitus, uncontrolled ([place]) 04/12/2011   Hypothyroid 04/12/2011  Gait abnormality 06/22/2010   Tibial tendinitis, posterior 06/22/2010    Alethea Terhaar,ANGIE PTA 07/27/2020, 12:21 PM  [facility]. [place], [place], [address] Phone: [phone]   Fax:  [phone]  Name: SHAUNTE TUFT MRN: [id number] Date of Birth: 1980/09/25

## 2020-08-01 ENCOUNTER — Ambulatory Visit: Payer: Medicare HMO | Admitting: Physical Therapy

## 2020-08-01 ENCOUNTER — Other Ambulatory Visit: Payer: Self-pay

## 2020-08-01 ENCOUNTER — Encounter: Payer: Self-pay | Admitting: Physical Therapy

## 2020-08-01 DIAGNOSIS — R262 Difficulty in walking, not elsewhere classified: Secondary | ICD-10-CM

## 2020-08-01 DIAGNOSIS — M25662 Stiffness of left knee, not elsewhere classified: Secondary | ICD-10-CM | POA: Diagnosis not present

## 2020-08-01 DIAGNOSIS — R600 Localized edema: Secondary | ICD-10-CM

## 2020-08-01 DIAGNOSIS — M25562 Pain in left knee: Secondary | ICD-10-CM | POA: Diagnosis not present

## 2020-08-01 NOTE — Therapy (Signed)
[facility]. [place], [place], [address] Phone: [phone]   Fax:  [phone]  Physical Therapy Treatment  Patient Details  Name: Wanda Oneill MRN: [id number] Date of Birth: 16-Jul-1980 Referring Provider (PT): Alvan Dame   Encounter Date: 08/01/2020   PT End of Session - 08/01/20 1649     Visit Number 9    Number of Visits 12    Date for PT Re-Evaluation 08/18/20    Authorization Type [employer]    PT Start Time 9798    PT Stop Time 1700    PT Time Calculation (min) 45 min    Activity Tolerance Patient tolerated treatment well    Behavior During Therapy [facility] for tasks assessed/performed             Past Medical History:  Diagnosis Date   Diabetes mellitus    type II   Down syndrome    Family history of adverse reaction to anesthesia    mom has had n/v   Gastritis    Headache    Hepatic steatosis    Hidradenitis    Hypothyroidism    Irritable bowel syndrome    Periumbilical hernia    Pneumonia 03/2020   frequent    Restless legs    Tendonitis    chronic in left foot   Thyroid disease    hypothyroidism    Past Surgical History:  Procedure Laterality Date   AXILLARY HIDRADENITIS EXCISION Bilateral    CHOLECYSTECTOMY     HERNIA REPAIR     multiple   INCISIONAL HERNIA REPAIR  05/30/2015   LAPROSCOPIC   INCISIONAL HERNIA REPAIR N/A 05/30/2015   Procedure: LAPAROSCOPIC INCISIONAL HERNIA;  Surgeon: Rolm Bookbinder, MD;  Location: [facility];  Service: General;  Laterality: N/A;   INSERTION OF MESH N/A 05/30/2015   Procedure: INSERTION OF MESH;  Surgeon: Rolm Bookbinder, MD;  Location: [facility];  Service: General;  Laterality: N/A;   KNEE ARTHROSCOPY     right knee   LAPAROSCOPY N/A 05/30/2015   Procedure: LAPAROSCOPY DIAGNOSTIC;  Surgeon: Rolm Bookbinder, MD;  Location: [facility];  Service: General;  Laterality: N/A;   PARTIAL KNEE ARTHROPLASTY Right 10/30/2017   Procedure: UNICOMPARTMENTAL RIGHT KNEE  LATERAL;  Surgeon: Paralee Cancel, MD;  [facility];  Service: Orthopedics;  Laterality: Right;  90 mins   PARTIAL KNEE ARTHROPLASTY Left 06/29/2020   Procedure: UNICOMPARTMENTAL KNEE LATERALLY;  Surgeon: Paralee Cancel, MD;  [facility];  Service: Orthopedics;  Laterality: Left;  70 MINS   TONSILLECTOMY     adenoids    There were no vitals filed for this visit.   Subjective Assessment - 08/01/20 1622     Subjective Mom and patient come in today, she had a fall over a week ago, they feel that she is moving better, but still hurting, large scab on the right leg    Currently in Pain? Yes    Pain Score 6     Pain Location Knee    Pain Orientation Right;Left    Pain Descriptors / Indicators Sore    Aggravating Factors  bending hurts                               [facility] Adult PT Treatment/Exercise - 08/01/20 0001       Ambulation/Gait   Gait Comments practiced walking with the [facility] again, but she was hesitant as she did not  trust the knees      Knee/Hip Exercises: Aerobic   Nustep L 5 38mn LE only      Knee/Hip Exercises: Seated   Long Arc Quad Strengthening;Left;2 sets;10 reps    Long Arc Quad Weight 2 lbs.    Hamstring Curl Strengthening;Left;2 sets;10 reps    Hamstring Limitations green tband      Vasopneumatic   Number Minutes Vasopneumatic  15 minutes    Vasopnuematic Location  Knee    Vasopneumatic Pressure Medium    Vasopneumatic Temperature  34                      PT Short Term Goals - 07/13/20 1753       PT SHORT TERM GOAL #1   Title independent with initial HEP    Status Achieved               PT Long Term Goals - 07/27/20 1218       PT LONG TERM GOAL #2   Title dcrease pain 50%    Baseline set back with fall over weekend    Status Partially Met      PT LONG TERM GOAL #3   Title increase left knee AROM to 0-120 degrees flexion    Baseline still quad lag and trouble firing    Status Partially Met      PT  LONG TERM GOAL #4   Title walk on all surfaces without limp and with minimal; pain    Baseline back on walker after fall    Status Partially Met                   Plan - 08/01/20 1649     Clinical Impression Statement I did Homan's test it was negative, the stability of the knee feels good, she has good motion, still struggles with the extension but looks better, no temperature, no tenderness.  [facility] and hesitant so we will need to work on this    PT Next Visit Plan continue to progress, ROM, strength and function. Gait with SPC    Consulted and Agree with Plan of Care Patient             Patient will benefit from skilled therapeutic intervention in order to improve the following deficits and impairments:  Abnormal gait, Decreased range of motion, Difficulty walking, Decreased activity tolerance, Pain, Impaired flexibility, Decreased balance, Decreased mobility, Decreased strength, Increased edema, Decreased scar mobility  Visit Diagnosis: Acute pain of left knee  Difficulty in walking, not elsewhere classified  Localized edema  Knee stiff, left     Problem List Patient Active Problem List   Diagnosis Date Noted   S/P left unicompartmental knee replacement, lateral 06/29/2020   At risk for obstructive sleep apnea 06/11/2019   Cough 06/11/2019   Multifocal pneumonia    Acute respiratory failure with hypoxia ([place] 05/26/2019   Hyperglycemia 05/26/2019   Acute [place] failure with hypoxemia ([place] 05/23/2019   Acute hypoxemic respiratory failure ([place] 05/20/2019   Fibromyalgia 03/09/2019   Neck pain, chronic 03/09/2019   Acute shoulder bursitis, right 02/03/2019   Bile salt-induced diarrhea 09/25/2018   Arthritis of left acromioclavicular joint 09/23/2018   Precordial chest pain 08/11/2018   Morbid obesity ([place] 10/31/2017   S/P right UKR, lateral 10/30/2017   Incarcerated epigastric hernia 05/30/2015   Left foot pain  08/09/2014   Chronic diarrhea 07/27/2014   Gout 08/10/2012  Acid reflux 01/22/2012   Down syndrome 11/11/2011   Hernia of abdominal wall 10/21/2011   Psoriasis 10/21/2011   Type 2 diabetes mellitus, uncontrolled ([place]) 04/12/2011   Hypothyroid 04/12/2011   Gait abnormality 06/22/2010   Tibial tendinitis, posterior 06/22/2010    Sumner Boast., PT 08/01/2020, 4:52 PM  [facility] [place]. [place], [place], [address] Phone: [phone]   Fax:  [phone]  Name: EMMALY LEECH MRN: [id number] Date of Birth: [birth year]

## 2020-08-03 ENCOUNTER — Ambulatory Visit: Payer: Medicare HMO | Admitting: Physical Therapy

## 2020-08-08 ENCOUNTER — Other Ambulatory Visit: Payer: Self-pay

## 2020-08-08 ENCOUNTER — Encounter: Payer: Self-pay | Admitting: Physical Therapy

## 2020-08-08 ENCOUNTER — Ambulatory Visit: Payer: Medicare HMO | Attending: Orthopedic Surgery | Admitting: Physical Therapy

## 2020-08-08 DIAGNOSIS — M25562 Pain in left knee: Secondary | ICD-10-CM | POA: Diagnosis not present

## 2020-08-08 DIAGNOSIS — M25571 Pain in right ankle and joints of right foot: Secondary | ICD-10-CM | POA: Diagnosis not present

## 2020-08-08 DIAGNOSIS — R600 Localized edema: Secondary | ICD-10-CM | POA: Insufficient documentation

## 2020-08-08 DIAGNOSIS — M25511 Pain in right shoulder: Secondary | ICD-10-CM | POA: Diagnosis not present

## 2020-08-08 DIAGNOSIS — M25662 Stiffness of left knee, not elsewhere classified: Secondary | ICD-10-CM | POA: Diagnosis not present

## 2020-08-08 DIAGNOSIS — M62830 Muscle spasm of back: Secondary | ICD-10-CM | POA: Insufficient documentation

## 2020-08-08 DIAGNOSIS — M545 Low back pain, unspecified: Secondary | ICD-10-CM | POA: Insufficient documentation

## 2020-08-08 DIAGNOSIS — M25611 Stiffness of right shoulder, not elsewhere classified: Secondary | ICD-10-CM | POA: Insufficient documentation

## 2020-08-08 DIAGNOSIS — M25661 Stiffness of right knee, not elsewhere classified: Secondary | ICD-10-CM | POA: Insufficient documentation

## 2020-08-08 DIAGNOSIS — R262 Difficulty in walking, not elsewhere classified: Secondary | ICD-10-CM | POA: Diagnosis not present

## 2020-08-08 DIAGNOSIS — R293 Abnormal posture: Secondary | ICD-10-CM | POA: Diagnosis not present

## 2020-08-08 DIAGNOSIS — M25512 Pain in left shoulder: Secondary | ICD-10-CM | POA: Insufficient documentation

## 2020-08-08 NOTE — Therapy (Signed)
[place]. [place], [place], [address] Phone: [phone]   Fax:  [phone] Progress Note Reporting Period 07/04/20 to 08/08/20 for the first 10 visits   See note below for Objective Data and Assessment of Progress/Goals.     Physical Therapy Treatment  Patient Details  Name: Wanda Oneill MRN: [id number] Date of Birth: 03/30/1980 Referring Provider (PT): Alvan Dame   Encounter Date: 08/08/2020   PT End of Session - 08/08/20 1624     Visit Number 10    Number of Visits 12    Date for PT Re-Evaluation 08/18/20    Authorization Type [employer]    PT Start Time 0569    PT Stop Time 7948    PT Time Calculation (min) 50 min    Activity Tolerance Patient tolerated treatment well    Behavior During Therapy WFL for tasks assessed/performed             Past Medical History:  Diagnosis Date   Diabetes mellitus    type II   Down syndrome    Family history of adverse reaction to anesthesia    mom has had n/v   Gastritis    Headache    Hepatic steatosis    Hidradenitis    Hypothyroidism    Irritable bowel syndrome    Periumbilical hernia    Pneumonia 03/2020   frequent    Restless legs    Tendonitis    chronic in left foot   Thyroid disease    hypothyroidism    Past Surgical History:  Procedure Laterality Date   AXILLARY HIDRADENITIS EXCISION Bilateral    CHOLECYSTECTOMY     HERNIA REPAIR     multiple   INCISIONAL HERNIA REPAIR  05/30/2015   LAPROSCOPIC   INCISIONAL HERNIA REPAIR N/A 05/30/2015   Procedure: LAPAROSCOPIC INCISIONAL HERNIA;  Surgeon: Rolm Bookbinder, MD;  Location: [place];  Service: General;  Laterality: N/A;   INSERTION OF MESH N/A 05/30/2015   Procedure: INSERTION OF MESH;  Surgeon: Rolm Bookbinder, MD;  Location: [place];  Service: General;  Laterality: N/A;   KNEE ARTHROSCOPY     right knee   LAPAROSCOPY N/A 05/30/2015   Procedure: LAPAROSCOPY DIAGNOSTIC;  Surgeon: Rolm Bookbinder,  MD;  Location: [place];  Service: General;  Laterality: N/A;   PARTIAL KNEE ARTHROPLASTY Right 10/30/2017   Procedure: UNICOMPARTMENTAL RIGHT KNEE LATERAL;  Surgeon: Paralee Cancel, MD;  Location: [facility];  Service: Orthopedics;  Laterality: Right;  90 mins   PARTIAL KNEE ARTHROPLASTY Left 06/29/2020   Procedure: UNICOMPARTMENTAL KNEE LATERALLY;  Surgeon: Paralee Cancel, MD;  Location: [facility];  Service: Orthopedics;  Laterality: Left;  70 MINS   TONSILLECTOMY     adenoids    There were no vitals filed for this visit.   Subjective Assessment - 08/08/20 1525     Subjective No falls but my knees are stiff and sore    Currently in Pain? Yes    Pain Score 6     Pain Location Knee    Pain Descriptors / Indicators Sore    Aggravating Factors  bending                               [facility] Adult PT Treatment/Exercise - 08/08/20 0001       Ambulation/Gait   Gait Comments gait with SPC, slow, cues to speed her up, CGA mostly 2 LOB and needing  Min A to right, she tends to want to use walls and furniture as well as the cane, did stairs one at a time but did them each way 4" and 6" steps up and down multiple times with CGA      Knee/Hip Exercises: Aerobic   Recumbent Bike 4 minutes L1    Nustep L 5 21mn LE only      Knee/Hip Exercises: Standing   Hip Flexion Stengthening;Left;1 set;10 reps;Knee bent;Knee straight;Right    Hip Flexion Limitations 2#    Hip Abduction Stengthening;Left;10 reps;Right      Knee/Hip Exercises: Seated   Long Arc Quad Strengthening;Left;2 sets;10 reps    Long Arc Quad Weight 2 lbs.    Hamstring Curl Strengthening;Left;2 sets;10 reps    Hamstring Limitations green tband      Vasopneumatic   Number Minutes Vasopneumatic  15 minutes    Vasopnuematic Location  Knee    Vasopneumatic Pressure Medium    Vasopneumatic Temperature  34                      PT Short Term Goals - 07/13/20 1753       PT SHORT TERM GOAL #1   Title  independent with initial HEP    Status Achieved               PT Long Term Goals - 08/08/20 1659       PT LONG TERM GOAL #3   Title increase left knee AROM to 0-120 degrees flexion    Status Partially Met      PT LONG TERM GOAL #4   Title walk on all surfaces without limp and with minimal; pain    Status On-going                   Plan - 08/08/20 1625     Clinical Impression Statement I am [facility] she is very stiff in the knees and the biggest issue is her fear now.  [facility] walls and furniture and is unsafe, she has a significant limp on the left and really does not put weight on , I can giver her cues and she will correct so it could be fear vs a habit.    PT Next Visit Plan continue to progress, ROM, strength and function. Gait with SPC    Consulted and Agree with Plan of Care Patient             Patient will benefit from skilled therapeutic intervention in order to improve the following deficits and impairments:  Abnormal gait, Decreased range of motion, Difficulty walking, Decreased activity tolerance, Pain, Impaired flexibility, Decreased balance, Decreased mobility, Decreased strength, Increased edema, Decreased scar mobility  Visit Diagnosis: Acute pain of left knee  Difficulty in walking, not elsewhere classified  Localized edema  Knee stiff, left     Problem List Patient Active Problem List   Diagnosis Date Noted   S/P left unicompartmental knee replacement, lateral 06/29/2020   At risk for obstructive sleep apnea 06/11/2019   Cough 06/11/2019   Multifocal pneumonia    Acute respiratory failure with hypoxia ([place] 05/26/2019   Hyperglycemia 05/26/2019   Acute respiratory failure with hypoxemia ([facility] 05/23/2019   Acute hypoxemic respiratory failure ([facility] 05/20/2019   Fibromyalgia 03/09/2019   Neck pain, chronic 03/09/2019   Acute shoulder bursitis, right 02/03/2019    Bile salt-induced  diarrhea 09/25/2018   Arthritis of left acromioclavicular joint 09/23/2018   Precordial chest pain 08/11/2018   Morbid obesity ([place]) 10/31/2017   S/P right UKR, lateral 10/30/2017   Incarcerated epigastric hernia 05/30/2015   Left foot pain 08/09/2014   Chronic diarrhea 07/27/2014   Gout 08/10/2012   Acid reflux 01/22/2012   Down syndrome 11/11/2011   Hernia of abdominal wall 10/21/2011   Psoriasis 10/21/2011   Type 2 diabetes mellitus, uncontrolled ([place]) 04/12/2011   Hypothyroid 04/12/2011   Gait abnormality 06/22/2010   Tibial tendinitis, posterior 06/22/2010    Sumner Boast., PT 08/08/2020, 5:00 PM  [place]. [place], [place], [address] Phone: [phone]   Fax:  [phone]  Name: Wanda Oneill MRN: [id number] Date of Birth: 1981/01/14

## 2020-08-10 ENCOUNTER — Other Ambulatory Visit: Payer: Self-pay

## 2020-08-10 ENCOUNTER — Ambulatory Visit: Payer: Medicare HMO | Admitting: Physical Therapy

## 2020-08-10 DIAGNOSIS — R600 Localized edema: Secondary | ICD-10-CM

## 2020-08-10 DIAGNOSIS — M25571 Pain in right ankle and joints of right foot: Secondary | ICD-10-CM | POA: Diagnosis not present

## 2020-08-10 DIAGNOSIS — M25511 Pain in right shoulder: Secondary | ICD-10-CM | POA: Diagnosis not present

## 2020-08-10 DIAGNOSIS — R293 Abnormal posture: Secondary | ICD-10-CM | POA: Diagnosis not present

## 2020-08-10 DIAGNOSIS — M25662 Stiffness of left knee, not elsewhere classified: Secondary | ICD-10-CM | POA: Diagnosis not present

## 2020-08-10 DIAGNOSIS — M62830 Muscle spasm of back: Secondary | ICD-10-CM | POA: Diagnosis not present

## 2020-08-10 DIAGNOSIS — R262 Difficulty in walking, not elsewhere classified: Secondary | ICD-10-CM

## 2020-08-10 DIAGNOSIS — M25562 Pain in left knee: Secondary | ICD-10-CM | POA: Diagnosis not present

## 2020-08-10 DIAGNOSIS — M25611 Stiffness of right shoulder, not elsewhere classified: Secondary | ICD-10-CM | POA: Diagnosis not present

## 2020-08-10 NOTE — Therapy (Signed)
[place]. [place], [place], [address] Phone: [phone]   Fax:  [phone]  Physical Therapy Treatment  Patient Details  Name: Wanda Oneill MRN: [id number] Date of Birth: 03-Dec-1980 Referring Provider (PT): Alvan Dame   Encounter Date: 08/10/2020   PT End of Session - 08/10/20 1608     Visit Number 11    Number of Visits 12    Date for PT Re-Evaluation 08/18/20    Authorization Type [employer]    PT Start Time 1520    PT Stop Time 1620    PT Time Calculation (min) 60 min             Past Medical History:  Diagnosis Date   Diabetes mellitus    type II   Down syndrome    Family history of adverse reaction to anesthesia    mom has had n/v   Gastritis    Headache    Hepatic steatosis    Hidradenitis    Hypothyroidism    Irritable bowel syndrome    Periumbilical hernia    Pneumonia 03/2020   frequent    Restless legs    Tendonitis    chronic in left foot   Thyroid disease    hypothyroidism    Past Surgical History:  Procedure Laterality Date   AXILLARY HIDRADENITIS EXCISION Bilateral    CHOLECYSTECTOMY     HERNIA REPAIR     multiple   INCISIONAL HERNIA REPAIR  05/30/2015   LAPROSCOPIC   INCISIONAL HERNIA REPAIR N/A 05/30/2015   Procedure: LAPAROSCOPIC INCISIONAL HERNIA;  Surgeon: Rolm Bookbinder, MD;  Location: [place];  Service: General;  Laterality: N/A;   INSERTION OF MESH N/A 05/30/2015   Procedure: INSERTION OF MESH;  Surgeon: Rolm Bookbinder, MD;  Location: [place];  Service: General;  Laterality: N/A;   KNEE ARTHROSCOPY     right knee   LAPAROSCOPY N/A 05/30/2015   Procedure: LAPAROSCOPY DIAGNOSTIC;  Surgeon: Rolm Bookbinder, MD;  Location: [place];  Service: General;  Laterality: N/A;   PARTIAL KNEE ARTHROPLASTY Right 10/30/2017   Procedure: UNICOMPARTMENTAL RIGHT KNEE LATERAL;  Surgeon: Paralee Cancel, MD;  [facility];  Service: Orthopedics;  Laterality: Right;  90 mins   PARTIAL  KNEE ARTHROPLASTY Left 06/29/2020   Procedure: UNICOMPARTMENTAL KNEE LATERALLY;  Surgeon: Paralee Cancel, MD;  [facility];  Service: Orthopedics;  Laterality: Left;  70 MINS   TONSILLECTOMY     adenoids    There were no vitals filed for this visit.   Subjective Assessment - 08/10/20 1523     Subjective waking without walker in apartment. " I just dont feel safe walking outside apt without walker- afraid of knees buckling and falling" see surgeon tomorrow    Currently in Pain? Yes    Pain Score 6     Pain Location Knee    Pain Orientation Right;Left                               [facility] Adult PT Treatment/Exercise - 08/10/20 0001       Ambulation/Gait   Pre-Gait Activities curb SPC and HHA with cuing 3 x min A    Gait Comments gait with SPC 400 feet, cued to increase stride and pick up Left LE vs sliding it- did better as gait distance increased      High Level Balance   High Level Balance Comments ball toss  on airex- cg-min A with decreased head turns      Knee/Hip Exercises: Aerobic   Nustep L 5 82mn LE only      Knee/Hip Exercises: Standing   Walking with Sports Cord 30# fwd/back 5 x with SPC CGA with cuing    Other Standing Knee Exercises HHA step tap 2# with SPC 10 x 2 sets      Knee/Hip Exercises: Seated   Long Arc Quad Strengthening;Both;2 sets;10 reps    Long Arc Quad Weight 2 lbs.    Sit to Sand 5 reps;with UE support   on airex CG-min A with initial stading     Vasopneumatic   Number Minutes Vasopneumatic  15 minutes    Vasopnuematic Location  Knee    Vasopneumatic Pressure Medium    Vasopneumatic Temperature  34                      PT Short Term Goals - 07/13/20 1753       PT SHORT TERM GOAL #1   Title independent with initial HEP    Status Achieved               PT Long Term Goals - 08/10/20 1607       PT LONG TERM GOAL #2   Title dcrease pain 50%    Status Partially Met      PT LONG TERM GOAL #3    Title increase left knee AROM to 0-120 degrees flexion    Baseline improved knee ext with ex, almost full with cuing with 2#    Status Partially Met      PT LONG TERM GOAL #4   Title walk on all surfaces without limp and with minimal; pain    Baseline cuing and assistance with SPC needed    Status Partially Met                   Plan - 08/10/20 1609     Clinical Impression Statement pt is moving much better, pt can not fire quad and has almost full ext, worked on balance and gait with SPC but very hesitant ,needs cuing and assistance. progressing with goals    PT Treatment/Interventions ADLs/Self Care Home Management;Cryotherapy;Electrical Stimulation;Ultrasound;Gait training;Neuromuscular re-education;Balance training;Therapeutic exercise;Therapeutic activities;Stair training;Functional mobility training;Patient/family education;Manual techniques;Vasopneumatic Device    PT Next Visit Plan pt only has 12 visitsauth so will need [employer] renewal if pt and mom agree to continue to work on strength ,gait and balance             Patient will benefit from skilled therapeutic intervention in order to improve the following deficits and impairments:  Abnormal gait, Decreased range of motion, Difficulty walking, Decreased activity tolerance, Pain, Impaired flexibility, Decreased balance, Decreased mobility, Decreased strength, Increased edema, Decreased scar mobility  Visit Diagnosis: Acute pain of left knee  Difficulty in walking, not elsewhere classified  Localized edema     Problem List Patient Active Problem List   Diagnosis Date Noted   S/P left unicompartmental knee replacement, lateral 06/29/2020   At risk for obstructive sleep apnea 06/11/2019   Cough 06/11/2019   Multifocal pneumonia    Acute respiratory failure with hypoxia ([facility] [place] 05/26/2019   Hyperglycemia 05/26/2019   Acute respiratory failure with hypoxemia ([facility] 05/23/2019   Acute hypoxemic respiratory failure  ([facility] 05/20/2019   Fibromyalgia 03/09/2019   Neck pain, chronic 03/09/2019   Acute shoulder bursitis, right 02/03/2019   Bile salt-induced diarrhea 09/25/2018  Arthritis of left acromioclavicular joint 09/23/2018   Precordial chest pain 08/11/2018   Morbid obesity ([place]) 10/31/2017   S/P right UKR, lateral 10/30/2017   Incarcerated epigastric hernia 05/30/2015   Left foot pain 08/09/2014   Chronic diarrhea 07/27/2014   Gout 08/10/2012   Acid reflux 01/22/2012   Down syndrome 11/11/2011   Hernia of abdominal wall 10/21/2011   Psoriasis 10/21/2011   Type 2 diabetes mellitus, uncontrolled ([place]) 04/12/2011   Hypothyroid 04/12/2011   Gait abnormality 06/22/2010   Tibial tendinitis, posterior 06/22/2010    Hays Dunnigan,ANGIE PTA 08/10/2020, 4:12 PM  [place]. [place], [place], [address] Phone: [phone]   Fax:  [phone]  Name: Wanda Oneill MRN: [id number] Date of Birth: [birth year]

## 2020-08-11 DIAGNOSIS — Z96651 Presence of right artificial knee joint: Secondary | ICD-10-CM | POA: Diagnosis not present

## 2020-08-11 DIAGNOSIS — Z96652 Presence of left artificial knee joint: Secondary | ICD-10-CM | POA: Diagnosis not present

## 2020-08-11 DIAGNOSIS — Z471 Aftercare following joint replacement surgery: Secondary | ICD-10-CM | POA: Diagnosis not present

## 2020-08-11 DIAGNOSIS — M25561 Pain in right knee: Secondary | ICD-10-CM | POA: Diagnosis not present

## 2020-08-11 DIAGNOSIS — M25562 Pain in left knee: Secondary | ICD-10-CM | POA: Diagnosis not present

## 2020-08-15 ENCOUNTER — Other Ambulatory Visit: Payer: Self-pay

## 2020-08-15 ENCOUNTER — Encounter: Payer: Self-pay | Admitting: Physical Therapy

## 2020-08-15 ENCOUNTER — Ambulatory Visit: Payer: Medicare HMO | Admitting: Physical Therapy

## 2020-08-15 DIAGNOSIS — R600 Localized edema: Secondary | ICD-10-CM

## 2020-08-15 DIAGNOSIS — R262 Difficulty in walking, not elsewhere classified: Secondary | ICD-10-CM

## 2020-08-15 DIAGNOSIS — M25571 Pain in right ankle and joints of right foot: Secondary | ICD-10-CM | POA: Diagnosis not present

## 2020-08-15 DIAGNOSIS — M25511 Pain in right shoulder: Secondary | ICD-10-CM | POA: Diagnosis not present

## 2020-08-15 DIAGNOSIS — M25611 Stiffness of right shoulder, not elsewhere classified: Secondary | ICD-10-CM | POA: Diagnosis not present

## 2020-08-15 DIAGNOSIS — M25662 Stiffness of left knee, not elsewhere classified: Secondary | ICD-10-CM

## 2020-08-15 DIAGNOSIS — M62830 Muscle spasm of back: Secondary | ICD-10-CM | POA: Diagnosis not present

## 2020-08-15 DIAGNOSIS — M25562 Pain in left knee: Secondary | ICD-10-CM | POA: Diagnosis not present

## 2020-08-15 DIAGNOSIS — R293 Abnormal posture: Secondary | ICD-10-CM | POA: Diagnosis not present

## 2020-08-15 NOTE — Therapy (Signed)
[place] [place]. [place], [place], [address] Phone: [phone]   Fax:  [phone]  Physical Therapy Treatment  Patient Details  Name: Wanda Oneill MRN: [id number] Date of Birth: 07-01-1980 Referring Provider (PT): Alvan Dame   Encounter Date: 08/15/2020   PT End of Session - 08/15/20 1711     Visit Number 12    Date for PT Re-Evaluation 08/18/20    Authorization Type [employer]    PT Start Time 1529    PT Stop Time 1630    PT Time Calculation (min) 61 min    Activity Tolerance Patient tolerated treatment well    Behavior During Therapy [facility] for tasks assessed/performed             Past Medical History:  Diagnosis Date   Diabetes mellitus    type II   Down syndrome    Family history of adverse reaction to anesthesia    mom has had n/v   Gastritis    Headache    Hepatic steatosis    Hidradenitis    Hypothyroidism    Irritable bowel syndrome    Periumbilical hernia    Pneumonia 03/2020   frequent    Restless legs    Tendonitis    chronic in left foot   Thyroid disease    hypothyroidism    Past Surgical History:  Procedure Laterality Date   AXILLARY HIDRADENITIS EXCISION Bilateral    CHOLECYSTECTOMY     HERNIA REPAIR     multiple   INCISIONAL HERNIA REPAIR  05/30/2015   LAPROSCOPIC   INCISIONAL HERNIA REPAIR N/A 05/30/2015   Procedure: LAPAROSCOPIC INCISIONAL HERNIA;  Surgeon: Rolm Bookbinder, MD;  Location: [place];  Service: General;  Laterality: N/A;   INSERTION OF MESH N/A 05/30/2015   Procedure: INSERTION OF MESH;  Surgeon: Rolm Bookbinder, MD;  Location: [place];  Service: General;  Laterality: N/A;   KNEE ARTHROSCOPY     right knee   LAPAROSCOPY N/A 05/30/2015   Procedure: LAPAROSCOPY DIAGNOSTIC;  Surgeon: Rolm Bookbinder, MD;  Location: [place];  Service: General;  Laterality: N/A;   PARTIAL KNEE ARTHROPLASTY Right 10/30/2017   Procedure: UNICOMPARTMENTAL RIGHT KNEE LATERAL;  Surgeon: Paralee Cancel, MD;  [facility];  Service: Orthopedics;  Laterality: Right;  90 mins   PARTIAL KNEE ARTHROPLASTY Left 06/29/2020   Procedure: UNICOMPARTMENTAL KNEE LATERALLY;  Surgeon: Paralee Cancel, MD;  [facility];  Service: Orthopedics;  Laterality: Left;  70 MINS   TONSILLECTOMY     adenoids    There were no vitals filed for this visit.   Subjective Assessment - 08/15/20 1542     Subjective Patient and her mom report a nother fall last week, reports that she was leaning against the wall and adjusting her shoe and fell onto her back side    Currently in Pain? Yes    Pain Score 5     Pain Location Knee    Pain Orientation Left    Pain Descriptors / Indicators Sore    Aggravating Factors  the falls scare me                               [facility] Adult PT Treatment/Exercise - 08/15/20 0001       Ambulation/Gait   Gait Comments worked only with [place] with walking today, inside and outside, uneven terrain, curbs, some grass, did some stairs with trying to  use the [facility] only since her parents house does not have hand rails, we did the stairs multiple times      High Level Balance   High Level Balance Comments ball toss on airex- cg-min A with decreased head turns      Knee/Hip Exercises: Aerobic   Nustep L 5 32mn LE only      Vasopneumatic   Number Minutes Vasopneumatic  12 minutes    Vasopnuematic Location  Knee    Vasopneumatic Pressure Medium    Vasopneumatic Temperature  34                      PT Short Term Goals - 07/13/20 1753       PT SHORT TERM GOAL #1   Title independent with initial HEP    Status Achieved               PT Long Term Goals - 08/10/20 1607       PT LONG TERM GOAL #2   Title dcrease pain 50%    Status Partially Met      PT LONG TERM GOAL #3   Title increase left knee AROM to 0-120 degrees flexion    Baseline improved knee ext with ex, almost full with cuing with 2#    Status Partially Met      PT LONG  TERM GOAL #4   Title walk on all surfaces without limp and with minimal; pain    Baseline cuing and assistance with SPC needed    Status Partially Met                   Plan - 08/15/20 1716     Clinical Impression Statement Patient very scared on the stairs especially going down, she is hesitant on the grass and on curbs, requires HHA.  She takes small steps and is very slow, cues help some but she tends to regress.    PT Next Visit Plan will need to do renewal and try to work on gait and independence    Consulted and Agree with Plan of Care Patient             Patient will benefit from skilled therapeutic intervention in order to improve the following deficits and impairments:  Abnormal gait, Decreased range of motion, Difficulty walking, Decreased activity tolerance, Pain, Impaired flexibility, Decreased balance, Decreased mobility, Decreased strength, Increased edema, Decreased scar mobility  Visit Diagnosis: Acute pain of left knee  Difficulty in walking, not elsewhere classified  Localized edema  Knee stiff, left     Problem List Patient Active Problem List   Diagnosis Date Noted   S/P left unicompartmental knee replacement, lateral 06/29/2020   At risk for obstructive sleep apnea 06/11/2019   Cough 06/11/2019   Multifocal pneumonia    Acute respiratory failure with hypoxia ([place] 05/26/2019   Hyperglycemia 05/26/2019   Acute respiratory failure with hypoxemia ([place] 05/23/2019   Acute hypoxemic respiratory failure ([facility] 05/20/2019   Fibromyalgia 03/09/2019   Neck pain, chronic 03/09/2019   Acute shoulder bursitis, right 02/03/2019   Bile salt-induced diarrhea 09/25/2018   Arthritis of left acromioclavicular joint 09/23/2018   Precordial chest pain 08/11/2018   Morbid obesity ([facility] 10/31/2017   S/P right UKR, lateral 10/30/2017   Incarcerated epigastric hernia 05/30/2015   Left foot pain 08/09/2014   Chronic diarrhea 07/27/2014   Gout 08/10/2012    Acid reflux 01/22/2012   Down syndrome 11/11/2011   Hernia  of abdominal wall 10/21/2011   Psoriasis 10/21/2011   Type 2 diabetes mellitus, uncontrolled ([place]) 04/12/2011   Hypothyroid 04/12/2011   Gait abnormality 06/22/2010   Tibial tendinitis, posterior 06/22/2010    Sumner Boast., PT 08/15/2020, 5:21 PM  [place]. [place], [place], [address] Phone: [phone]   Fax:  [phone]  Name: Wanda Oneill MRN: [id number] Date of Birth: July 13, 1980

## 2020-08-17 ENCOUNTER — Other Ambulatory Visit: Payer: Self-pay

## 2020-08-17 ENCOUNTER — Ambulatory Visit: Payer: Medicare HMO | Admitting: Physical Therapy

## 2020-08-17 DIAGNOSIS — M25511 Pain in right shoulder: Secondary | ICD-10-CM | POA: Diagnosis not present

## 2020-08-17 DIAGNOSIS — M25562 Pain in left knee: Secondary | ICD-10-CM

## 2020-08-17 DIAGNOSIS — M25611 Stiffness of right shoulder, not elsewhere classified: Secondary | ICD-10-CM | POA: Diagnosis not present

## 2020-08-17 DIAGNOSIS — M25571 Pain in right ankle and joints of right foot: Secondary | ICD-10-CM | POA: Diagnosis not present

## 2020-08-17 DIAGNOSIS — R600 Localized edema: Secondary | ICD-10-CM

## 2020-08-17 DIAGNOSIS — R262 Difficulty in walking, not elsewhere classified: Secondary | ICD-10-CM

## 2020-08-17 DIAGNOSIS — M62830 Muscle spasm of back: Secondary | ICD-10-CM | POA: Diagnosis not present

## 2020-08-17 DIAGNOSIS — R293 Abnormal posture: Secondary | ICD-10-CM | POA: Diagnosis not present

## 2020-08-17 DIAGNOSIS — M25662 Stiffness of left knee, not elsewhere classified: Secondary | ICD-10-CM | POA: Diagnosis not present

## 2020-08-17 NOTE — Therapy (Signed)
[place]. [place], [place], [address] Phone: [phone]   Fax:  [phone]  Physical Therapy Treatment  Patient Details  Name: Wanda Oneill MRN: [id number] Date of Birth: November 08, 1980 Referring Provider (PT): Alvan Dame   Encounter Date: 08/17/2020   PT End of Session - 08/17/20 1625     Visit Number 38    Authorization Type [employer]    PT Start Time 9924    PT Stop Time 1630    PT Time Calculation (min) 60 min             Past Medical History:  Diagnosis Date   Diabetes mellitus    type II   Down syndrome    Family history of adverse reaction to anesthesia    mom has had n/v   Gastritis    Headache    Hepatic steatosis    Hidradenitis    Hypothyroidism    Irritable bowel syndrome    Periumbilical hernia    Pneumonia 03/2020   frequent    Restless legs    Tendonitis    chronic in left foot   Thyroid disease    hypothyroidism    Past Surgical History:  Procedure Laterality Date   AXILLARY HIDRADENITIS EXCISION Bilateral    CHOLECYSTECTOMY     HERNIA REPAIR     multiple   INCISIONAL HERNIA REPAIR  05/30/2015   LAPROSCOPIC   INCISIONAL HERNIA REPAIR N/A 05/30/2015   Procedure: LAPAROSCOPIC INCISIONAL HERNIA;  Surgeon: Rolm Bookbinder, MD;  Location: [place];  Service: General;  Laterality: N/A;   INSERTION OF MESH N/A 05/30/2015   Procedure: INSERTION OF MESH;  Surgeon: Rolm Bookbinder, MD;  Location: [facility];  Service: General;  Laterality: N/A;   KNEE ARTHROSCOPY     right knee   LAPAROSCOPY N/A 05/30/2015   Procedure: LAPAROSCOPY DIAGNOSTIC;  Surgeon: Rolm Bookbinder, MD;  Location: [facility];  Service: General;  Laterality: N/A;   PARTIAL KNEE ARTHROPLASTY Right 10/30/2017   Procedure: UNICOMPARTMENTAL RIGHT KNEE LATERAL;  Surgeon: Paralee Cancel, MD;  [facility];  Service: Orthopedics;  Laterality: Right;  90 mins   PARTIAL KNEE ARTHROPLASTY Left 06/29/2020   Procedure: UNICOMPARTMENTAL  KNEE LATERALLY;  Surgeon: Paralee Cancel, MD;  Location[facility];  Service: Orthopedics;  Laterality: Left;  70 MINS   TONSILLECTOMY     adenoids    There were no vitals filed for this visit.   Subjective Assessment - 08/17/20 1542     Subjective just so nervous about loosing my balance and falling    Currently in Pain? Yes    Pain Score 6     Pain Location Knee    Pain Orientation Left                [facility] PT Assessment - 08/17/20 0001       AROM   AROM Assessment Site Knee    Right/Left Knee Left    Left Knee Extension 8    Left Knee Flexion 102                           [facility] Adult PT Treatment/Exercise - 08/17/20 0001       Ambulation/Gait   Gait Comments worked only with [place] with walking today, inside and outside, uneven terrain, curbs, some grass,   cued for correct curb sequencing,needed HHA with SPC to go down curb as she is fearful. transition surfaces  and thresholds are scarey for pt and she looks for [facility]     High Level Balance   High Level Balance Activities Negotiating over obstacles   with [facility]     Knee/Hip Exercises: Aerobic   Nustep L 5 36mn LE only      Knee/Hip Exercises: Machines for Strengthening   Cybex Leg Press 20# 15 x BIL- cued for full TKE      Knee/Hip Exercises: Seated   Long Arc Quad Strengthening;Both;2 sets;10 reps;Weights    Long Arc Quad Weight 3 lbs.    Sit to Sand 10 reps;without UE support   on airex SBA     Vasopneumatic   Number Minutes Vasopneumatic  12 minutes    Vasopnuematic Location  Knee    Vasopneumatic Pressure Medium    Vasopneumatic Temperature  34                      PT Short Term Goals - 07/13/20 1753       PT SHORT TERM GOAL #1   Title independent with initial HEP    Status Achieved               PT Long Term Goals - 08/17/20 1622       PT LONG TERM GOAL #2   Title dcrease pain 50%    Baseline set back with 2 nd fall    Status Partially Met      PT LONG TERM  GOAL #3   Title increase left knee AROM to 0-120 degrees flexion    Baseline 8-105, quad lag    Status Partially Met      PT LONG TERM GOAL #4   Title walk on all surfaces without limp and with minimal; pain    Baseline cuing and assistance with SPC needed- fearful of falling, curbs and trasnition surfaces are hard fo rpt    Status On-going                   Plan - 08/17/20 1625     Clinical Impression Statement focus on gait with SPC but very fearful after 2 falls, a definate set back. pt struggles with curbs esp downa nd needs HHA with SPC and transtioning surfaces is difficult. pt lacks confidence after her 2 falls. pt with quad weakness and quad lag noted. pt gait is very slow    PT Treatment/Interventions ADLs/Self Care Home Management;Cryotherapy;Electrical Stimulation;Ultrasound;Gait training;Neuromuscular re-education;Balance training;Therapeutic exercise;Therapeutic activities;Stair training;Functional mobility training;Patient/family education;Manual techniques;Vasopneumatic Device    PT Next Visit Plan renewal  done. work on gait and independence/confidence as well as quad strength             Patient will benefit from skilled therapeutic intervention in order to improve the following deficits and impairments:  Abnormal gait, Decreased range of motion, Difficulty walking, Decreased activity tolerance, Pain, Impaired flexibility, Decreased balance, Decreased mobility, Decreased strength, Increased edema, Decreased scar mobility  Visit Diagnosis: Acute pain of left knee  Difficulty in walking, not elsewhere classified  Localized edema     Problem List Patient Active Problem List   Diagnosis Date Noted   S/P left unicompartmental knee replacement, lateral 06/29/2020   At risk for obstructive sleep apnea 06/11/2019   Cough 06/11/2019   Multifocal pneumonia    Acute respiratory failure with hypoxia ([facility] 05/26/2019   Hyperglycemia 05/26/2019   Acute  respiratory failure with hypoxemia ([facility] 05/23/2019   Acute hypoxemic respiratory failure ([facility] 05/20/2019   Fibromyalgia  03/09/2019   Neck pain, chronic 03/09/2019   Acute shoulder bursitis, right 02/03/2019   Bile salt-induced diarrhea 09/25/2018   Arthritis of left acromioclavicular joint 09/23/2018   Precordial chest pain 08/11/2018   Morbid obesity ([place]) 10/31/2017   S/P right UKR, lateral 10/30/2017   Incarcerated epigastric hernia 05/30/2015   Left foot pain 08/09/2014   Chronic diarrhea 07/27/2014   Gout 08/10/2012   Acid reflux 01/22/2012   Down syndrome 11/11/2011   Hernia of abdominal wall 10/21/2011   Psoriasis 10/21/2011   Type 2 diabetes mellitus, uncontrolled ([place]) 04/12/2011   Hypothyroid 04/12/2011   Gait abnormality 06/22/2010   Tibial tendinitis, posterior 06/22/2010    Klayton Monie,ANGIE PTA 08/17/2020, 4:32 PM  [place]. [place], [place], [address] Phone: [phone]   Fax:  [phone]  Name: Wanda Oneill MRN: [id number] Date of Birth: 1980/04/18

## 2020-08-22 ENCOUNTER — Encounter: Payer: Self-pay | Admitting: Physical Therapy

## 2020-08-22 ENCOUNTER — Ambulatory Visit: Payer: Medicare HMO | Admitting: Physical Therapy

## 2020-08-22 ENCOUNTER — Other Ambulatory Visit: Payer: Self-pay

## 2020-08-22 DIAGNOSIS — M25662 Stiffness of left knee, not elsewhere classified: Secondary | ICD-10-CM

## 2020-08-22 DIAGNOSIS — R293 Abnormal posture: Secondary | ICD-10-CM | POA: Diagnosis not present

## 2020-08-22 DIAGNOSIS — M25511 Pain in right shoulder: Secondary | ICD-10-CM | POA: Diagnosis not present

## 2020-08-22 DIAGNOSIS — M545 Low back pain, unspecified: Secondary | ICD-10-CM

## 2020-08-22 DIAGNOSIS — M62830 Muscle spasm of back: Secondary | ICD-10-CM | POA: Diagnosis not present

## 2020-08-22 DIAGNOSIS — M25562 Pain in left knee: Secondary | ICD-10-CM | POA: Diagnosis not present

## 2020-08-22 DIAGNOSIS — M25512 Pain in left shoulder: Secondary | ICD-10-CM

## 2020-08-22 DIAGNOSIS — R600 Localized edema: Secondary | ICD-10-CM

## 2020-08-22 DIAGNOSIS — M25571 Pain in right ankle and joints of right foot: Secondary | ICD-10-CM

## 2020-08-22 DIAGNOSIS — R262 Difficulty in walking, not elsewhere classified: Secondary | ICD-10-CM | POA: Diagnosis not present

## 2020-08-22 DIAGNOSIS — M25611 Stiffness of right shoulder, not elsewhere classified: Secondary | ICD-10-CM | POA: Diagnosis not present

## 2020-08-22 DIAGNOSIS — M25661 Stiffness of right knee, not elsewhere classified: Secondary | ICD-10-CM

## 2020-08-22 NOTE — Therapy (Signed)
[place]. [place], [place], [address] Phone: [phone]   Fax:  [phone]  Physical Therapy Treatment  Patient Details  Name: Wanda Oneill MRN: [id number] Date of Birth: Jun 11, 1980 Referring Provider (PT): Alvan Dame   Encounter Date: 08/22/2020   PT End of Session - 08/22/20 6147     Visit Number 14    Number of Visits 13    Date for PT Re-Evaluation 08/18/20    Authorization Type [employer]    PT Start Time 0929    PT Stop Time 1330    PT Time Calculation (min) 60 min             Past Medical History:  Diagnosis Date   Diabetes mellitus    type II   Down syndrome    Family history of adverse reaction to anesthesia    mom has had n/v   Gastritis    Headache    Hepatic steatosis    Hidradenitis    Hypothyroidism    Irritable bowel syndrome    Periumbilical hernia    Pneumonia 03/2020   frequent    Restless legs    Tendonitis    chronic in left foot   Thyroid disease    hypothyroidism    Past Surgical History:  Procedure Laterality Date   AXILLARY HIDRADENITIS EXCISION Bilateral    CHOLECYSTECTOMY     HERNIA REPAIR     multiple   INCISIONAL HERNIA REPAIR  05/30/2015   LAPROSCOPIC   INCISIONAL HERNIA REPAIR N/A 05/30/2015   Procedure: LAPAROSCOPIC INCISIONAL HERNIA;  Surgeon: Rolm Bookbinder, MD;  Location: [place];  Service: General;  Laterality: N/A;   INSERTION OF MESH N/A 05/30/2015   Procedure: INSERTION OF MESH;  Surgeon: Rolm Bookbinder, MD;  Location: [facility];  Service: General;  Laterality: N/A;   KNEE ARTHROSCOPY     right knee   LAPAROSCOPY N/A 05/30/2015   Procedure: LAPAROSCOPY DIAGNOSTIC;  Surgeon: Rolm Bookbinder, MD;  Location: [facility];  Service: General;  Laterality: N/A;   PARTIAL KNEE ARTHROPLASTY Right 10/30/2017   Procedure: UNICOMPARTMENTAL RIGHT KNEE LATERAL;  Surgeon: Paralee Cancel, MD;  [facility];  Service: Orthopedics;  Laterality: Right;  90 mins   PARTIAL  KNEE ARTHROPLASTY Left 06/29/2020   Procedure: UNICOMPARTMENTAL KNEE LATERALLY;  Surgeon: Paralee Cancel, MD;  [facility];  Service: Orthopedics;  Laterality: Left;  70 MINS   TONSILLECTOMY     adenoids    There were no vitals filed for this visit.   Subjective Assessment - 08/22/20 1238     Subjective Pt came in using her cane and shes been ambulating with her cane since Thursday.    Pain Score 5     Pain Location Knee    Pain Orientation Left                               [facility] Adult PT Treatment/Exercise - 08/22/20 0001       Ambulation/Gait   Ambulation/Gait Yes    Ambulation Distance (Feet) 75 Feet    Assistive device Straight cane    Ambulation Surface Level    Gait Comments single hand hold working on taking big steps and speeding up walking      High Level Balance   High Level Balance Comments Obsticle course, step up 6", step down, onto airex, over 2 1/2 foam rollers  Knee/Hip Exercises: Aerobic   Nustep L 6x 37mn LE only      Knee/Hip Exercises: Machines for Strengthening   Cybex Knee Extension 5# up with 2 down with left x10   needed assistance was eccentric control   Cybex Knee Flexion 25# 2 sets 10      Knee/Hip Exercises: Standing   Other Standing Knee Exercises stairs up and over x3 4" and 6"      Vasopneumatic   Number Minutes Vasopneumatic  15 minutes    Vasopnuematic Location  Knee    Vasopneumatic Pressure Medium    Vasopneumatic Temperature  34                      PT Short Term Goals - 07/13/20 1753       PT SHORT TERM GOAL #1   Title independent with initial HEP    Status Achieved               PT Long Term Goals - 08/22/20 1405       PT LONG TERM GOAL #2   Title dcrease pain 50%    Baseline set back with 2 nd fall    Time 12    Period Weeks    Status Partially Met      PT LONG TERM GOAL #3   Title increase left knee AROM to 0-120 degrees flexion    Baseline 8-105, quad lag    Time  12    Period Weeks    Status Partially Met      PT LONG TERM GOAL #4   Title walk on all surfaces without limp and with minimal; pain    Baseline cuing and assistance with SPC needed- fearful of falling, curbs and trasnition surfaces are hard fo rpt    Time 12    Period Weeks    Status On-going                   Plan - 08/22/20 1314     Clinical Impression Statement Pt tolerated all exercises well. She ambulated in today with a SPC and reports shes been using it since Thursday. Pt goal is to go back to school but shes fearful of falling. Today worked on staris so she can navigate stairs into school. Gait cadence increased with hand held assist on level surface for 738f Did an obstical course working on un[employer] and stepping over objects. Pt did knee extension machine with some reported increase in pain and needed assist on eccentric control.    PT Treatment/Interventions ADLs/Self Care Home Management;Cryotherapy;Electrical Stimulation;Ultrasound;Gait training;Neuromuscular re-education;Balance training;Therapeutic exercise;Therapeutic activities;Stair training;Functional mobility training;Patient/family education;Manual techniques;Vasopneumatic Device    PT Next Visit Plan renewal  done. work on gait and independence/confidence as well as quad strength             Patient will benefit from skilled therapeutic intervention in order to improve the following deficits and impairments:  Abnormal gait, Decreased range of motion, Difficulty walking, Decreased activity tolerance, Pain, Impaired flexibility, Decreased balance, Decreased mobility, Decreased strength, Increased edema, Decreased scar mobility  Visit Diagnosis: Acute pain of left knee  Difficulty in walking, not elsewhere classified  Localized edema  Knee stiff, left  Pain in right ankle and joints of right foot  Acute pain of right shoulder  Stiffness of right shoulder, not elsewhere  classified  Abnormal posture  Muscle spasm of back  Acute bilateral low back pain without sciatica  Stiffness  of right knee, not elsewhere classified  Acute pain of left shoulder     Problem List Patient Active Problem List   Diagnosis Date Noted   S/P left unicompartmental knee replacement, lateral 06/29/2020   At risk for obstructive sleep apnea 06/11/2019   Cough 06/11/2019   Multifocal pneumonia    Acute respiratory failure with hypoxia ([place]) 05/26/2019   Hyperglycemia 05/26/2019   Acute respiratory failure with hypoxemia ([place]) 05/23/2019   Acute hypoxemic respiratory failure ([place]) 05/20/2019   Fibromyalgia 03/09/2019   Neck pain, chronic 03/09/2019   Acute shoulder bursitis, right 02/03/2019   Bile salt-induced diarrhea 09/25/2018   Arthritis of left acromioclavicular joint 09/23/2018   Precordial chest pain 08/11/2018   Morbid obesity ([place]) 10/31/2017   S/P right UKR, lateral 10/30/2017   Incarcerated epigastric hernia 05/30/2015   Left foot pain 08/09/2014   Chronic diarrhea 07/27/2014   Gout 08/10/2012   Acid reflux 01/22/2012   Down syndrome 11/11/2011   Hernia of abdominal wall 10/21/2011   Psoriasis 10/21/2011   Type 2 diabetes mellitus, uncontrolled ([place]) 04/12/2011   Hypothyroid 04/12/2011   Gait abnormality 06/22/2010   Tibial tendinitis, posterior 06/22/2010    Dawayne Cirri, SPTA 08/22/2020, 2:08 PM  [place]. [place], [place], [address] Phone: [phone]   Fax:  [phone]  Name: TAMEE BATTIN MRN: [id number] Date of Birth: 02-04-1981

## 2020-08-25 NOTE — Progress Notes (Deleted)
[place] at [facility] [address] [place], [place] [address] [phone]  Date:  08/30/2020   Name:  Wanda Oneill   DOB:  1980/02/09   MRN:  [id number]  PCP:  Darreld Mclean, MD    Chief Complaint: No chief complaint on file.   History of Present Illness:  Wanda Oneill is a 40 y.o. very pleasant female patient who presents with the following:  Pt seen today with concern of irregular menstrual bleeding She is on OCP  History of DM, hypothyroidism, Down syndrome   Patient Active Problem List   Diagnosis Date Noted   S/P left unicompartmental knee replacement, lateral 06/29/2020   At risk for obstructive sleep apnea 06/11/2019   Cough 06/11/2019   Multifocal pneumonia    Acute respiratory failure with hypoxia ([place]) 05/26/2019   Hyperglycemia 05/26/2019   Acute respiratory failure with hypoxemia ([place]) 05/23/2019   Acute hypoxemic respiratory failure ([place]) 05/20/2019   Fibromyalgia 03/09/2019   Neck pain, chronic 03/09/2019   Acute shoulder bursitis, right 02/03/2019   Bile salt-induced diarrhea 09/25/2018   Arthritis of left acromioclavicular joint 09/23/2018   Precordial chest pain 08/11/2018   Morbid obesity ([place]) 10/31/2017   S/P right UKR, lateral 10/30/2017   Incarcerated epigastric hernia 05/30/2015   Left foot pain 08/09/2014   Chronic diarrhea 07/27/2014   Gout 08/10/2012   Acid reflux 01/22/2012   Down syndrome 11/11/2011   Hernia of abdominal wall 10/21/2011   Psoriasis 10/21/2011   Type 2 diabetes mellitus, uncontrolled ([place]) 04/12/2011   Hypothyroid 04/12/2011   Gait abnormality 06/22/2010   Tibial tendinitis, posterior 06/22/2010    Past Medical History:  Diagnosis Date   Diabetes mellitus    type II   Down syndrome    Family history of adverse reaction to anesthesia    mom has had n/v   Gastritis    Headache    Hepatic steatosis    Hidradenitis    Hypothyroidism    Irritable  bowel syndrome    Periumbilical hernia    Pneumonia 03/2020   frequent    Restless legs    Tendonitis    chronic in left foot   Thyroid disease    hypothyroidism    Past Surgical History:  Procedure Laterality Date   AXILLARY HIDRADENITIS EXCISION Bilateral    CHOLECYSTECTOMY     HERNIA REPAIR     multiple   INCISIONAL HERNIA REPAIR  05/30/2015   LAPROSCOPIC   INCISIONAL HERNIA REPAIR N/A 05/30/2015   Procedure: LAPAROSCOPIC INCISIONAL HERNIA;  Surgeon: Rolm Bookbinder, MD;  Location: [facility];  Service: General;  Laterality: N/A;   INSERTION OF MESH N/A 05/30/2015   Procedure: INSERTION OF MESH;  Surgeon: Rolm Bookbinder, MD;  Location: [place];  Service: General;  Laterality: N/A;   KNEE ARTHROSCOPY     right knee   LAPAROSCOPY N/A 05/30/2015   Procedure: LAPAROSCOPY DIAGNOSTIC;  Surgeon: Rolm Bookbinder, MD;  Location: [facility];  Service: General;  Laterality: N/A;   PARTIAL KNEE ARTHROPLASTY Right 10/30/2017   Procedure: UNICOMPARTMENTAL RIGHT KNEE LATERAL;  Surgeon: Paralee Cancel, MD;  [facility];  Service: Orthopedics;  Laterality: Right;  90 mins   PARTIAL KNEE ARTHROPLASTY Left 06/29/2020   Procedure: UNICOMPARTMENTAL KNEE LATERALLY;  Surgeon: Paralee Cancel, MD;  [facility];  Service: Orthopedics;  Laterality: Left;  70 MINS   TONSILLECTOMY     adenoids    Social History  Tobacco Use   Smoking status: Never   Smokeless tobacco: Never  Vaping Use   Vaping Use: Never used  Substance Use Topics   Alcohol use: No    Alcohol/week: 0.0 standard drinks   Drug use: No    Family History  Problem Relation Age of Onset   Thyroid cancer Mother    Diabetes type II Mother    High Cholesterol Mother    Heart disease Mother    Diabetes type II Father    Hypertension Father    Stroke Father     Allergies  Allergen Reactions   Vancomycin Itching   Cefuroxime Axetil Hives   Sulfa Antibiotics Hives    Medication list has been reviewed and updated.  Current  Outpatient Medications on File Prior to Visit  Medication Sig Dispense Refill   acetaminophen (TYLENOL) 325 MG tablet Take 650 mg by mouth every 6 (six) hours as needed for mild pain.      albuterol (VENTOLIN HFA) 108 (90 Base) MCG/ACT inhaler Inhale 2 puffs into the lungs every 4 (four) hours as needed for wheezing or shortness of breath. 8 g 0   allopurinol (ZYLOPRIM) 300 MG tablet Take 1 tablet by mouth once daily 90 tablet 1   benzonatate (TESSALON) 100 MG capsule Take 1-2 capsules (100-200 mg total) by mouth 3 (three) times daily as needed for cough. 40 capsule 0   Blood Glucose Monitoring Suppl (BLOOD GLUCOSE METER KIT AND SUPPLIES) Dispense based on patient and insurance preference. To check blood sugars daily FOR ICD-9 250.00 (Patient taking differently: 1 each by Other route See admin instructions. Dispense based on patient and insurance preference. To check blood sugars daily FOR ICD-9 250.00) 1 each 0   Blood Glucose Monitoring Suppl (TRUE METRIX METER) w/Device KIT Use as directed to check glucose up to two times daily. Dx Code E11.9 1 kit 0   cetirizine (ZYRTEC) 10 MG tablet Take 10 mg by mouth daily as needed for allergies.      cholestyramine (QUESTRAN) 4 g packet DISSOLVE & TAKE 1 PACKET OF POWDER BY MOUTH TWICE DAILY (Patient taking differently: Take 4 g by mouth 2 (two) times daily.) 180 each 3   clobetasol cream (TEMOVATE) 8.18 % Apply 1 application topically daily as needed (for eczema).      dicyclomine (BENTYL) 20 MG tablet Take 20 mg by mouth daily as needed for spasms.     docusate sodium (COLACE) 100 MG capsule Take 1 capsule (100 mg total) by mouth 2 (two) times daily. 10 capsule 0   empagliflozin (JARDIANCE) 25 MG TABS tablet Take 25 mg by mouth daily.     fluconazole (DIFLUCAN) 150 MG tablet Take 150 mg by mouth every 7 (seven) days.     fluticasone (FLOVENT HFA) 110 MCG/ACT inhaler Inhale 1 puff into the lungs in the morning and at bedtime. 1 each 1   Glucose Blood (BLOOD  GLUCOSE TEST STRIPS) STRP 1 each by Other route See admin instructions. Use to test blood sugar up to twice a day 100 strip 3   glucose blood (FREESTYLE TEST STRIPS) test strip Use as directed to test blood sugar up to twice a day. Dx code E11.9 100 each 12   glucose blood (TRUE METRIX BLOOD GLUCOSE TEST) test strip Use as instructed to check glucose up to 2 times daily. Dx Code E11.9 100 each 12   HYDROcodone-acetaminophen (NORCO/VICODIN) 5-325 MG tablet Take 1-2 tablets by mouth every 6 (six) hours as needed for severe  pain. 42 tablet 0   levonorgestrel-ethinyl estradiol (JOLESSA) 0.15-0.03 MG tablet Take 1 tablet by mouth daily. 84 tablet 3   levothyroxine (EUTHYROX) 137 MCG tablet Take 1 tablet (137 mcg total) by mouth daily before breakfast. 90 tablet 3   meloxicam (MOBIC) 15 MG tablet Take 1 tablet (15 mg total) by mouth daily. 30 tablet 2   metFORMIN (GLUCOPHAGE) 1000 MG tablet Take 1,000 mg by mouth 2 (two) times daily.   2   methocarbamol (ROBAXIN) 500 MG tablet Take 1 tablet (500 mg total) by mouth every 6 (six) hours as needed for muscle spasms. 40 tablet 0   ondansetron (ZOFRAN) 4 MG tablet Take 4 mg by mouth every 8 (eight) hours as needed for nausea/vomiting.      ONE TOUCH ULTRA TEST test strip USE TO CHECK BLOOD SUGAR ONCE DAILY (ICD CODE:25.00) (Patient taking differently: 1 each by Other route daily.) 100 each 3   Semaglutide,0.25 or 0.5MG/DOS, 2 MG/1.5ML SOPN Inject 0.5 mg into the skin every Saturday.     TRUEplus Lancets 30G MISC Use as directed to check glucose up to two times daily. Dx code E11.9 694 each 3   TRULICITY 1.5 WN/4.6EV SOPN Inject 1.5 mg into the skin every Saturday.     VASCEPA 1 g capsule Take 2 g by mouth 2 (two) times daily.     No current facility-administered medications on file prior to visit.    Review of Systems:  As per HPI- otherwise negative.   Physical Examination: There were no vitals filed for this visit. There were no vitals filed for this  visit. There is no height or weight on file to calculate BMI. Ideal Body Weight:    GEN: no acute distress. HEENT: Atraumatic, Normocephalic.  Ears and Nose: No external deformity. CV: RRR, No M/G/R. No JVD. No thrill. No extra heart sounds. PULM: CTA B, no wheezes, crackles, rhonchi. No retractions. No resp. distress. No accessory muscle use. ABD: S, NT, ND, +BS. No rebound. No HSM. EXTR: No c/c/e PSYCH: Normally interactive. Conversant.    Assessment and Plan: ***  This visit occurred during the SARS-CoV-2 public health emergency.  Safety protocols were in place, including screening questions prior to the visit, additional usage of staff PPE, and extensive cleaning of exam room while observing appropriate contact time as indicated for disinfecting solutions.   Signed Lamar Blinks, MD

## 2020-08-29 ENCOUNTER — Other Ambulatory Visit: Payer: Self-pay

## 2020-08-29 ENCOUNTER — Encounter: Payer: Self-pay | Admitting: Physical Therapy

## 2020-08-29 ENCOUNTER — Ambulatory Visit: Payer: Medicare HMO | Admitting: Physical Therapy

## 2020-08-29 DIAGNOSIS — M25662 Stiffness of left knee, not elsewhere classified: Secondary | ICD-10-CM | POA: Diagnosis not present

## 2020-08-29 DIAGNOSIS — M25571 Pain in right ankle and joints of right foot: Secondary | ICD-10-CM | POA: Diagnosis not present

## 2020-08-29 DIAGNOSIS — M25611 Stiffness of right shoulder, not elsewhere classified: Secondary | ICD-10-CM | POA: Diagnosis not present

## 2020-08-29 DIAGNOSIS — R293 Abnormal posture: Secondary | ICD-10-CM

## 2020-08-29 DIAGNOSIS — M62830 Muscle spasm of back: Secondary | ICD-10-CM | POA: Diagnosis not present

## 2020-08-29 DIAGNOSIS — M545 Low back pain, unspecified: Secondary | ICD-10-CM

## 2020-08-29 DIAGNOSIS — R600 Localized edema: Secondary | ICD-10-CM

## 2020-08-29 DIAGNOSIS — M25512 Pain in left shoulder: Secondary | ICD-10-CM

## 2020-08-29 DIAGNOSIS — M25511 Pain in right shoulder: Secondary | ICD-10-CM | POA: Diagnosis not present

## 2020-08-29 DIAGNOSIS — M25562 Pain in left knee: Secondary | ICD-10-CM | POA: Diagnosis not present

## 2020-08-29 DIAGNOSIS — R262 Difficulty in walking, not elsewhere classified: Secondary | ICD-10-CM | POA: Diagnosis not present

## 2020-08-29 DIAGNOSIS — M25661 Stiffness of right knee, not elsewhere classified: Secondary | ICD-10-CM

## 2020-08-29 NOTE — Therapy (Signed)
[place]. [place], [place], [address] Phone: [phone]   Fax:  [phone]  Physical Therapy Treatment  Patient Details  Name: Wanda Oneill MRN: [id number] Date of Birth: 1980-05-08 Referring Provider (PT): Alvan Dame   Encounter Date: 08/29/2020   PT End of Session - 08/29/20 0712     Visit Number 15    Number of Visits 13    Date for PT Re-Evaluation 08/18/20    Authorization Type [employer]    PT Start Time 1100    PT Stop Time 1975    PT Time Calculation (min) 55 min    Activity Tolerance Patient tolerated treatment well    Behavior During Therapy [facility] for tasks assessed/performed             Past Medical History:  Diagnosis Date   Diabetes mellitus    type II   Down syndrome    Family history of adverse reaction to anesthesia    mom has had n/v   Gastritis    Headache    Hepatic steatosis    Hidradenitis    Hypothyroidism    Irritable bowel syndrome    Periumbilical hernia    Pneumonia 03/2020   frequent    Restless legs    Tendonitis    chronic in left foot   Thyroid disease    hypothyroidism    Past Surgical History:  Procedure Laterality Date   AXILLARY HIDRADENITIS EXCISION Bilateral    CHOLECYSTECTOMY     HERNIA REPAIR     multiple   INCISIONAL HERNIA REPAIR  05/30/2015   LAPROSCOPIC   INCISIONAL HERNIA REPAIR N/A 05/30/2015   Procedure: LAPAROSCOPIC INCISIONAL HERNIA;  Surgeon: Rolm Bookbinder, MD;  Location: [facility];  Service: General;  Laterality: N/A;   INSERTION OF MESH N/A 05/30/2015   Procedure: INSERTION OF MESH;  Surgeon: Rolm Bookbinder, MD;  Location: [facility];  Service: General;  Laterality: N/A;   KNEE ARTHROSCOPY     right knee   LAPAROSCOPY N/A 05/30/2015   Procedure: LAPAROSCOPY DIAGNOSTIC;  Surgeon: Rolm Bookbinder, MD;  Location: [facility];  Service: General;  Laterality: N/A;   PARTIAL KNEE ARTHROPLASTY Right 10/30/2017   Procedure: UNICOMPARTMENTAL RIGHT KNEE  LATERAL;  Surgeon: Paralee Cancel, MD;  [facility];  Service: Orthopedics;  Laterality: Right;  90 mins   PARTIAL KNEE ARTHROPLASTY Left 06/29/2020   Procedure: UNICOMPARTMENTAL KNEE LATERALLY;  Surgeon: Paralee Cancel, MD;  [facility];  Service: Orthopedics;  Laterality: Left;  70 MINS   TONSILLECTOMY     adenoids    There were no vitals filed for this visit.   Subjective Assessment - 08/29/20 1104     Subjective Pt came in using her cane she has not gone back to school later this week.    Currently in Pain? Yes    Pain Score 5     Pain Location Knee                               [facility] Adult PT Treatment/Exercise - 08/29/20 0001       Ambulation/Gait   Ambulation/Gait Yes    Assistive device Straight cane    Ambulation Surface Outdoor;Paved;Grass;Unlevel    Stairs Yes    Stair Management Technique One rail Left;With cane    Number of Stairs --   up and over x3   Height of Stairs 4   6  Knee/Hip Exercises: Aerobic   Nustep L 5x 64mn LE only      Knee/Hip Exercises: Seated   Long Arc Quad 2 sets;Left;Right;10 reps   3#     Vasopneumatic   Number Minutes Vasopneumatic  15 minutes    Vasopnuematic Location  Knee    Vasopneumatic Pressure Medium    Vasopneumatic Temperature  34                      PT Short Term Goals - 07/13/20 1753       PT SHORT TERM GOAL #1   Title independent with initial HEP    Status Achieved               PT Long Term Goals - 08/29/20 1143       PT LONG TERM GOAL #2   Title dcrease pain 50%    Baseline set back with 2 nd fall    Time 12    Period Weeks    Status Partially Met      PT LONG TERM GOAL #3   Title increase left knee AROM to 0-120 degrees flexion    Baseline 8-105, quad lag    Time 12    Period Weeks    Status Partially Met      PT LONG TERM GOAL #4   Title walk on all surfaces without limp and with minimal; pain    Baseline cuing and assistance with SPC needed-  fearful of falling, curbs and trasnition surfaces are hard fo rpt    Time 12    Period Weeks                   Plan - 08/29/20 1140     Clinical Impression Statement Pt tolerated all exercises well. We went outside going uphill, downhill, curbs and uneven surfaces. Pt did use door frame to help stabilize herself but she required less assistance while going up and down curbs. She plans to go to school later this week. Continued to work on stairs in order for pt to feel more confient with the stairs at school. Pt did LAQ and reported some pain but was able to complete the entire exercise.    PT Treatment/Interventions ADLs/Self Care Home Management;Cryotherapy;Electrical Stimulation;Ultrasound;Gait training;Neuromuscular re-education;Balance training;Therapeutic exercise;Therapeutic activities;Stair training;Functional mobility training;Patient/family education;Manual techniques;Vasopneumatic Device    PT Next Visit Plan renewal  done. work on gait and independence/confidence as well as quad strength             Patient will benefit from skilled therapeutic intervention in order to improve the following deficits and impairments:  Abnormal gait, Decreased range of motion, Difficulty walking, Decreased activity tolerance, Pain, Impaired flexibility, Decreased balance, Decreased mobility, Decreased strength, Increased edema, Decreased scar mobility  Visit Diagnosis: Acute pain of left knee  Difficulty in walking, not elsewhere classified  Localized edema  Knee stiff, left  Pain in right ankle and joints of right foot  Acute pain of right shoulder  Stiffness of right shoulder, not elsewhere classified  Abnormal posture  Muscle spasm of back  Acute bilateral low back pain without sciatica  Stiffness of right knee, not elsewhere classified  Acute pain of left shoulder     Problem List Patient Active Problem List   Diagnosis Date Noted   S/P left unicompartmental  knee replacement, lateral 06/29/2020   At risk for obstructive sleep apnea 06/11/2019   Cough 06/11/2019   Multifocal pneumonia    Acute  respiratory failure with hypoxia ([place]) 05/26/2019   Hyperglycemia 05/26/2019   Acute respiratory failure with hypoxemia ([place]) 05/23/2019   Acute hypoxemic respiratory failure ([place]) 05/20/2019   Fibromyalgia 03/09/2019   Neck pain, chronic 03/09/2019   Acute shoulder bursitis, right 02/03/2019   Bile salt-induced diarrhea 09/25/2018   Arthritis of left acromioclavicular joint 09/23/2018   Precordial chest pain 08/11/2018   Morbid obesity ([place]) 10/31/2017   S/P right UKR, lateral 10/30/2017   Incarcerated epigastric hernia 05/30/2015   Left foot pain 08/09/2014   Chronic diarrhea 07/27/2014   Gout 08/10/2012   Acid reflux 01/22/2012   Down syndrome 11/11/2011   Hernia of abdominal wall 10/21/2011   Psoriasis 10/21/2011   Type 2 diabetes mellitus, uncontrolled ([place]) 04/12/2011   Hypothyroid 04/12/2011   Gait abnormality 06/22/2010   Tibial tendinitis, posterior 06/22/2010    Dawayne Cirri, SPTA 08/29/2020, 11:59 AM  [place]. [place], [place], [address] Phone: [phone]   Fax:  [phone]  Name: Wanda Oneill MRN: [id number] Date of Birth: 17-Mar-1980

## 2020-08-30 ENCOUNTER — Ambulatory Visit: Payer: Medicare HMO | Admitting: Family Medicine

## 2020-08-31 ENCOUNTER — Ambulatory Visit: Payer: Medicare HMO | Admitting: Physical Therapy

## 2020-08-31 ENCOUNTER — Other Ambulatory Visit: Payer: Self-pay

## 2020-08-31 DIAGNOSIS — M25511 Pain in right shoulder: Secondary | ICD-10-CM | POA: Diagnosis not present

## 2020-08-31 DIAGNOSIS — R600 Localized edema: Secondary | ICD-10-CM

## 2020-08-31 DIAGNOSIS — R293 Abnormal posture: Secondary | ICD-10-CM | POA: Diagnosis not present

## 2020-08-31 DIAGNOSIS — M25562 Pain in left knee: Secondary | ICD-10-CM

## 2020-08-31 DIAGNOSIS — M25611 Stiffness of right shoulder, not elsewhere classified: Secondary | ICD-10-CM | POA: Diagnosis not present

## 2020-08-31 DIAGNOSIS — M62830 Muscle spasm of back: Secondary | ICD-10-CM | POA: Diagnosis not present

## 2020-08-31 DIAGNOSIS — M25571 Pain in right ankle and joints of right foot: Secondary | ICD-10-CM | POA: Diagnosis not present

## 2020-08-31 DIAGNOSIS — R262 Difficulty in walking, not elsewhere classified: Secondary | ICD-10-CM | POA: Diagnosis not present

## 2020-08-31 DIAGNOSIS — M25662 Stiffness of left knee, not elsewhere classified: Secondary | ICD-10-CM | POA: Diagnosis not present

## 2020-08-31 NOTE — Therapy (Signed)
[place]. [place], [place], [address] Phone: [phone]   Fax:  [phone]  Physical Therapy Treatment  Patient Details  Name: Wanda Oneill MRN: [id number] Date of Birth: Apr 01, 1980 Referring Provider (PT): Alvan Dame   Encounter Date: 08/31/2020   PT End of Session - 08/31/20 1611     Visit Number 16    Authorization Type [employer]    PT Start Time 5686    PT Stop Time 1630    PT Time Calculation (min) 65 min             Past Medical History:  Diagnosis Date   Diabetes mellitus    type II   Down syndrome    Family history of adverse reaction to anesthesia    mom has had n/v   Gastritis    Headache    Hepatic steatosis    Hidradenitis    Hypothyroidism    Irritable bowel syndrome    Periumbilical hernia    Pneumonia 03/2020   frequent    Restless legs    Tendonitis    chronic in left foot   Thyroid disease    hypothyroidism    Past Surgical History:  Procedure Laterality Date   AXILLARY HIDRADENITIS EXCISION Bilateral    CHOLECYSTECTOMY     HERNIA REPAIR     multiple   INCISIONAL HERNIA REPAIR  05/30/2015   LAPROSCOPIC   INCISIONAL HERNIA REPAIR N/A 05/30/2015   Procedure: LAPAROSCOPIC INCISIONAL HERNIA;  Surgeon: Rolm Bookbinder, MD;  Location: [facility];  Service: General;  Laterality: N/A;   INSERTION OF MESH N/A 05/30/2015   Procedure: INSERTION OF MESH;  Surgeon: Rolm Bookbinder, MD;  Location: [facility];  Service: General;  Laterality: N/A;   KNEE ARTHROSCOPY     right knee   LAPAROSCOPY N/A 05/30/2015   Procedure: LAPAROSCOPY DIAGNOSTIC;  Surgeon: Rolm Bookbinder, MD;  Location: [facility];  Service: General;  Laterality: N/A;   PARTIAL KNEE ARTHROPLASTY Right 10/30/2017   Procedure: UNICOMPARTMENTAL RIGHT KNEE LATERAL;  Surgeon: Paralee Cancel, MD;  [facility];  Service: Orthopedics;  Laterality: Right;  90 mins   PARTIAL KNEE ARTHROPLASTY Left 06/29/2020   Procedure: UNICOMPARTMENTAL  KNEE LATERALLY;  Surgeon: Paralee Cancel, MD;  [facility];  Service: Orthopedics;  Laterality: Left;  70 MINS   TONSILLECTOMY     adenoids    There were no vitals filed for this visit.   Subjective Assessment - 08/31/20 1535     Subjective amb in with cane doing well. stated went to school and did well    Currently in Pain? Yes    Pain Score 3     Pain Location Knee    Pain Orientation Left                               [facility] Adult PT Treatment/Exercise - 08/31/20 0001       Transfers   Comments set up and worked on [employer] transfer      Knee/Hip Exercises: Aerobic   Recumbent Bike 6 min L 1    Nustep L 5x 34mn LE only      Knee/Hip Exercises: Machines for Strengthening   Cybex Knee Extension 10# BIL 2 sets 5, 5# 2 sets 3  LLE only    Cybex Knee Flexion 15# 2 sets 10 LLE only      Knee/Hip Exercises: Standing  Forward Step Up Left;5 sets;5 reps;Step Height: 4"   various hand placments,unabel to do 6 inch and fearfull with 4 inch needing assistance     Vasopneumatic   Number Minutes Vasopneumatic  15 minutes    Vasopnuematic Location  Knee    Vasopneumatic Pressure Medium    Vasopneumatic Temperature  34                      PT Short Term Goals - 07/13/20 1753       PT SHORT TERM GOAL #1   Title independent with initial HEP    Status Achieved               PT Long Term Goals - 08/31/20 1612       PT LONG TERM GOAL #2   Title dcrease pain 50%    Status Partially Met      PT LONG TERM GOAL #3   Title increase left knee AROM to 0-120 degrees flexion    Baseline quad lag still present    Status Partially Met      PT LONG TERM GOAL #4   Title walk on all surfaces without limp and with minimal; pain    Status Partially Met                   Plan - 08/31/20 1613     Clinical Impression Statement amb better with SPC ,increased cadeance, stride and confidence. worked on step up variations but pt  very fearful and nneded HHA- tried multi stes varies ways. unable to do 6 inch. pt still with fear and diffiuclty neg step/curb and transition surfaces. func strength improving but quad lag still present. practicd simulated tub transfer using tub bench.    PT Treatment/Interventions ADLs/Self Care Home Management;Cryotherapy;Electrical Stimulation;Ultrasound;Gait training;Neuromuscular re-education;Balance training;Therapeutic exercise;Therapeutic activities;Stair training;Functional mobility training;Patient/family education;Manual techniques;Vasopneumatic Device    PT Next Visit Plan quad strength. work on gait and independence/confidence as well as quad strength             Patient will benefit from skilled therapeutic intervention in order to improve the following deficits and impairments:  Abnormal gait, Decreased range of motion, Difficulty walking, Decreased activity tolerance, Pain, Impaired flexibility, Decreased balance, Decreased mobility, Decreased strength, Increased edema, Decreased scar mobility  Visit Diagnosis: Difficulty in walking, not elsewhere classified  Localized edema  Acute pain of left knee     Problem List Patient Active Problem List   Diagnosis Date Noted   S/P left unicompartmental knee replacement, lateral 06/29/2020   At risk for obstructive sleep apnea 06/11/2019   Cough 06/11/2019   Multifocal pneumonia    Acute respiratory failure with hypoxia ([place]) 05/26/2019   Hyperglycemia 05/26/2019   Acute respiratory failure with hypoxemia ([place]) 05/23/2019   Acute hypoxemic respiratory failure ([place]) 05/20/2019   Fibromyalgia 03/09/2019   Neck pain, chronic 03/09/2019   Acute shoulder bursitis, right 02/03/2019   Bile salt-induced diarrhea 09/25/2018   Arthritis of left acromioclavicular joint 09/23/2018   Precordial chest pain 08/11/2018   Morbid obesity ([place]) 10/31/2017   S/P right UKR, lateral 10/30/2017   Incarcerated epigastric hernia 05/30/2015    Left foot pain 08/09/2014   Chronic diarrhea 07/27/2014   Gout 08/10/2012   Acid reflux 01/22/2012   Down syndrome 11/11/2011   Hernia of abdominal wall 10/21/2011   Psoriasis 10/21/2011   Type 2 diabetes mellitus, uncontrolled ([place]) 04/12/2011   Hypothyroid 04/12/2011   Gait abnormality 06/22/2010  Tibial tendinitis, posterior 06/22/2010    Nafis Farnan,ANGIE PTA 08/31/2020, 4:18 PM  [place]. [place], [place], [address] Phone: [phone]   Fax:  [phone]  Name: GRAINNE KNIGHTS MRN: [id number] Date of Birth: 1981-01-08

## 2020-09-02 ENCOUNTER — Other Ambulatory Visit: Payer: Self-pay | Admitting: Family Medicine

## 2020-09-03 NOTE — Progress Notes (Signed)
[facility] [place], [place], [place] [address] [phone]  Date:  09/04/2020   Name:  Wanda Oneill   DOB:  [birth year]   MRN:  [id number]  PCP:  Wanda Mclean, MD    Chief Complaint: Menstrual Bleeding (Post surgery, vaginal bleeding off and on)   History of Present Illness:  Wanda Oneill is a 40 y.o. very pleasant female patient who presents with the following:  Wanda Oneill is a charming woman with Down syndrome, daughter of my former partner Wanda Oneill History of diabetes, hypothyroidism, obesity, Down syndrome and several associated MSK problems Wanda Oneill manages her diabetes and thyroid   Patient seen today with concern of change in her menstrual bleeding Last seen by myself in March She had a  LEFT partial knee replacement on May 26 of this year-she had a few falls and a somewhat difficult recovery  She is still doing in person PT   She had about 6 weeks of menstrual bleeding right after her surgery She took a 7 day break from her OCP (which she takes on a continuous basis) just prior to her knee surgery; she did this in hopes that she would complete a bleed and then have no bleeding during her surgical recovery.  She takes these breaks from continuous OCP intermittently, does not always follow a set schedule.  She may take a break every 3 to 4 months  After 1 week off of OCP she started back on her daily pill, and her bleeding did finally resolved 3 weeks ago She has not bled in about 3 weeks She notes that her bleeding was not overly heavy, she was not passing clots.  Mild cramping but not particularly painful  Also, she has noted some right lower back pain.  She and her mother are interested in adding physical therapy for her back Lab Results  Component Value Date   HGBA1C 9.1 (H) 06/23/2020    Patient Active Problem List   Diagnosis Date Noted   S/P left unicompartmental knee replacement,  lateral 06/29/2020   At risk for obstructive sleep apnea 06/11/2019   Cough 06/11/2019   Multifocal pneumonia    Acute respiratory failure with hypoxia ([place]) 05/26/2019   Hyperglycemia 05/26/2019   Acute respiratory failure with hypoxemia ([place]) 05/23/2019   Acute hypoxemic respiratory failure ([place]) 05/20/2019   Fibromyalgia 03/09/2019   Neck pain, chronic 03/09/2019   Acute shoulder bursitis, right 02/03/2019   Bile salt-induced diarrhea 09/25/2018   Arthritis of left acromioclavicular joint 09/23/2018   Precordial chest pain 08/11/2018   Morbid obesity ([place]) 10/31/2017   S/P right UKR, lateral 10/30/2017   Incarcerated epigastric hernia 05/30/2015   Left foot pain 08/09/2014   Chronic diarrhea 07/27/2014   Gout 08/10/2012   Acid reflux 01/22/2012   Down syndrome 11/11/2011   Hernia of abdominal wall 10/21/2011   Psoriasis 10/21/2011   Type 2 diabetes mellitus, uncontrolled ([place]) 04/12/2011   Hypothyroid 04/12/2011   Gait abnormality 06/22/2010   Tibial tendinitis, posterior 06/22/2010    Past Medical History:  Diagnosis Date   Diabetes mellitus    type II   Down syndrome    Family history of adverse reaction to anesthesia    mom has had n/v   Gastritis    Headache    Hepatic steatosis    Hidradenitis    Hypothyroidism    Irritable bowel syndrome    Periumbilical hernia  Pneumonia 03/2020   frequent    Restless legs    Tendonitis    chronic in left foot   Thyroid disease    hypothyroidism    Past Surgical History:  Procedure Laterality Date   AXILLARY HIDRADENITIS EXCISION Bilateral    CHOLECYSTECTOMY     HERNIA REPAIR     multiple   INCISIONAL HERNIA REPAIR  05/30/2015   LAPROSCOPIC   INCISIONAL HERNIA REPAIR N/A 05/30/2015   Procedure: LAPAROSCOPIC INCISIONAL HERNIA;  Surgeon: Rolm Bookbinder, MD;  Location: [facility];  Service: General;  Laterality: N/A;   INSERTION OF MESH N/A 05/30/2015   Procedure: INSERTION OF MESH;  Surgeon: Rolm Bookbinder, MD;   Location: [facility];  Service: General;  Laterality: N/A;   KNEE ARTHROSCOPY     right knee   LAPAROSCOPY N/A 05/30/2015   Procedure: LAPAROSCOPY DIAGNOSTIC;  Surgeon: Rolm Bookbinder, MD;  Location: [facility];  Service: General;  Laterality: N/A;   PARTIAL KNEE ARTHROPLASTY Right 10/30/2017   Procedure: UNICOMPARTMENTAL RIGHT KNEE LATERAL;  Surgeon: Paralee Cancel, MD;  [facility];  Service: Orthopedics;  Laterality: Right;  90 mins   PARTIAL KNEE ARTHROPLASTY Left 06/29/2020   Procedure: UNICOMPARTMENTAL KNEE LATERALLY;  Surgeon: Paralee Cancel, MD;  [facility];  Service: Orthopedics;  Laterality: Left;  70 MINS   TONSILLECTOMY     adenoids    Social History   Tobacco Use   Smoking status: Never   Smokeless tobacco: Never  Vaping Use   Vaping Use: Never used  Substance Use Topics   Alcohol use: No    Alcohol/week: 0.0 standard drinks   Drug use: No    Family History  Problem Relation Age of Onset   Thyroid cancer Mother    Diabetes type II Mother    High Cholesterol Mother    Heart disease Mother    Diabetes type II Father    Hypertension Father    Stroke Father     Allergies  Allergen Reactions   Vancomycin Itching   Cefuroxime Axetil Hives   Sulfa Antibiotics Hives    Medication list has been reviewed and updated.  Current Outpatient Medications on File Prior to Visit  Medication Sig Dispense Refill   acetaminophen (TYLENOL) 325 MG tablet Take 650 mg by mouth every 6 (six) hours as needed for mild pain.      allopurinol (ZYLOPRIM) 300 MG tablet Take 1 tablet by mouth once daily 90 tablet 1   Blood Glucose Monitoring Suppl (BLOOD GLUCOSE METER KIT AND SUPPLIES) Dispense based on patient and insurance preference. To check blood sugars daily FOR ICD-9 250.00 (Patient taking differently: 1 each by Other route See admin instructions. Dispense based on patient and insurance preference. To check blood sugars daily FOR ICD-9 250.00) 1 each 0   Blood Glucose Monitoring  Suppl (TRUE METRIX METER) w/Device KIT Use as directed to check glucose up to two times daily. Dx Code E11.9 1 kit 0   cholestyramine (QUESTRAN) 4 g packet DISSOLVE & TAKE 1 PACKET OF POWDER BY MOUTH TWICE DAILY (Patient taking differently: Take 4 g by mouth 2 (two) times daily.) 180 each 3   dicyclomine (BENTYL) 20 MG tablet Take 20 mg by mouth daily as needed for spasms.     empagliflozin (JARDIANCE) 25 MG TABS tablet Take 25 mg by mouth daily.     Glucose Blood (BLOOD GLUCOSE TEST STRIPS) STRP 1 each by Other route See admin instructions. Use to test blood sugar up to twice a  day 100 strip 3   glucose blood (FREESTYLE TEST STRIPS) test strip Use as directed to test blood sugar up to twice a day. Dx code E11.9 100 each 12   glucose blood (TRUE METRIX BLOOD GLUCOSE TEST) test strip Use as instructed to check glucose up to 2 times daily. Dx Code E11.9 100 each 12   HYDROcodone-acetaminophen (NORCO/VICODIN) 5-325 MG tablet Take 1-2 tablets by mouth every 6 (six) hours as needed for severe pain. 42 tablet 0   levonorgestrel-ethinyl estradiol (JOLESSA) 0.15-0.03 MG tablet Take 1 tablet by mouth daily. 84 tablet 3   levothyroxine (EUTHYROX) 137 MCG tablet Take 1 tablet (137 mcg total) by mouth daily before breakfast. 90 tablet 3   metFORMIN (GLUCOPHAGE) 1000 MG tablet Take 1,000 mg by mouth 2 (two) times daily.   2   methocarbamol (ROBAXIN) 500 MG tablet Take 1 tablet (500 mg total) by mouth every 6 (six) hours as needed for muscle spasms. 40 tablet 0   ONE TOUCH ULTRA TEST test strip USE TO CHECK BLOOD SUGAR ONCE DAILY (ICD CODE:25.00) (Patient taking differently: 1 each by Other route daily.) 100 each 3   TRUEplus Lancets 30G MISC Use as directed to check glucose up to two times daily. Dx code E11.9 794 each 3   TRULICITY 1.5 IA/1.6PV SOPN Inject 1.5 mg into the skin every Saturday.     VASCEPA 1 g capsule Take 2 g by mouth 2 (two) times daily.     No current facility-administered medications on file  prior to visit.    Review of Systems:  As per HPI- otherwise negative.   Physical Examination: Vitals:   09/04/20 1133  BP: 128/82  Pulse: 85  Resp: 18  Temp: 97.8 F (36.6 C)  SpO2: 98%   Vitals:   09/04/20 1133  Weight: 230 lb (104.3 kg)  Height: '5\' 4"'  (1.626 m)   Body mass index is 39.48 kg/m. Ideal Body Weight: Weight in (lb) to have BMI = 25: 145.3  GEN: no acute distress.  Obese, looks well HEENT: Atraumatic, Normocephalic.  Ears and Nose: No external deformity. CV: RRR, No M/G/R. No JVD. No thrill. No extra heart sounds. PULM: CTA B, no wheezes, crackles, rhonchi. No retractions. No resp. distress. No accessory muscle use. EXTR: No c/c/e PSYCH: Normally interactive. Conversant.  Wanda Oneill points out soreness in her right lower thoracic, paraspinal musculature.  There is spasm in this area She is still using a cane following her knee surgery   Assessment and Plan: Irregular menstrual bleeding  Chronic right-sided thoracic back pain - Plan: Ambulatory referral to Physical Therapy Wanda Oneill is here today with concern of irregular menstrual bleeding as described above.  She typically takes daily OCP long-term, will take a 7-day withdrawal now and again to allow for bleed.  I suspect she most likely had this prolonged period of bleeding in response to surgical stress.  Her recovery was complicated somewhat by several falls.  However, I also discussed the possibility other concerns such as endometrial hyperplasia or even cancer.  This does become more likely as Kimbely gets older. Julitza has never been sexually active and also has not used tampons.  She has never had a Pap smear.   We talked about further evaluation for irregular menstrual bleeding such as uterine ultrasound-which most likely would need to involve a vaginal [place] component.  Eithel does not feel that she could get through this without sedation.  We discussed a GYN referral, they can help [place] determine if  further  evaluation such as an ultrasound might be necessary, and if needed could likely arrange for the procedure to be done with sedation.    Right now the patient and her mother prefer to see how she does in the upcoming weeks and months.  If they decide  to see GYN for consultation I am glad to place this referral In the meantime, I suggested that she take a scheduled 7-day withdrawal bleed every 2 months in hopes of avoiding episodes of prolonged bleeding  I have also placed a referral to physical therapy for her lower back pain  This visit occurred during the SARS-CoV-2 public health emergency.  Safety protocols were in place, including screening questions prior to the visit, additional usage of staff PPE, and extensive cleaning of exam room while observing appropriate contact time as indicated for disinfecting solutions.   Signed Lamar Blinks, MD

## 2020-09-04 ENCOUNTER — Encounter: Payer: Self-pay | Admitting: Family Medicine

## 2020-09-04 ENCOUNTER — Other Ambulatory Visit: Payer: Self-pay

## 2020-09-04 ENCOUNTER — Ambulatory Visit (INDEPENDENT_AMBULATORY_CARE_PROVIDER_SITE_OTHER): Payer: Medicare HMO | Admitting: Family Medicine

## 2020-09-04 VITALS — BP 128/82 | HR 85 | Temp 97.8°F | Resp 18 | Ht 64.0 in | Wt 230.0 lb

## 2020-09-04 DIAGNOSIS — M546 Pain in thoracic spine: Secondary | ICD-10-CM

## 2020-09-04 DIAGNOSIS — G8929 Other chronic pain: Secondary | ICD-10-CM | POA: Diagnosis not present

## 2020-09-04 DIAGNOSIS — N926 Irregular menstruation, unspecified: Secondary | ICD-10-CM | POA: Diagnosis not present

## 2020-09-04 NOTE — Patient Instructions (Signed)
It was good to see you today! Enjoy the beach If you have any more unusual menstrual bleeding please let me know!  We can have you see GYN to further evaluate this situation if you like  You might also try taking a 7 day break from your birth control pill every 2 months; this may help keep you from having these long and unexpected bleeding periods

## 2020-09-05 ENCOUNTER — Encounter: Payer: Self-pay | Admitting: Physical Therapy

## 2020-09-05 ENCOUNTER — Ambulatory Visit: Payer: Medicare HMO | Attending: Orthopedic Surgery | Admitting: Physical Therapy

## 2020-09-05 DIAGNOSIS — M25662 Stiffness of left knee, not elsewhere classified: Secondary | ICD-10-CM | POA: Insufficient documentation

## 2020-09-05 DIAGNOSIS — M25562 Pain in left knee: Secondary | ICD-10-CM | POA: Diagnosis not present

## 2020-09-05 DIAGNOSIS — R296 Repeated falls: Secondary | ICD-10-CM | POA: Insufficient documentation

## 2020-09-05 DIAGNOSIS — R262 Difficulty in walking, not elsewhere classified: Secondary | ICD-10-CM | POA: Diagnosis not present

## 2020-09-05 DIAGNOSIS — M545 Low back pain, unspecified: Secondary | ICD-10-CM | POA: Insufficient documentation

## 2020-09-05 DIAGNOSIS — R600 Localized edema: Secondary | ICD-10-CM | POA: Diagnosis not present

## 2020-09-05 NOTE — Therapy (Signed)
[place]. [place], [place], [address] Phone: [phone]   Fax:  [phone]  Physical Therapy Evaluation  Patient Details  Name: Wanda Oneill MRN: [id number] Date of Birth: Dec 12, 1980 Referring Provider (PT): Copland   Encounter Date: 09/05/2020   PT End of Session - 09/05/20 1629     Visit Number 17    Number of Visits 29    Date for PT Re-Evaluation 10/06/20    Authorization Type [employer]    PT Start Time 1613    PT Stop Time 1709    PT Time Calculation (min) 56 min    Activity Tolerance Patient tolerated treatment well    Behavior During Therapy WFL for tasks assessed/performed             Past Medical History:  Diagnosis Date   Diabetes mellitus    type II   Down syndrome    Family history of adverse reaction to anesthesia    mom has had n/v   Gastritis    Headache    Hepatic steatosis    Hidradenitis    Hypothyroidism    Irritable bowel syndrome    Periumbilical hernia    Pneumonia 03/2020   frequent    Restless legs    Tendonitis    chronic in left foot   Thyroid disease    hypothyroidism    Past Surgical History:  Procedure Laterality Date   AXILLARY HIDRADENITIS EXCISION Bilateral    CHOLECYSTECTOMY     HERNIA REPAIR     multiple   INCISIONAL HERNIA REPAIR  05/30/2015   LAPROSCOPIC   INCISIONAL HERNIA REPAIR N/A 05/30/2015   Procedure: LAPAROSCOPIC INCISIONAL HERNIA;  Surgeon: Rolm Bookbinder, MD;  Location: [facility];  Service: General;  Laterality: N/A;   INSERTION OF MESH N/A 05/30/2015   Procedure: INSERTION OF MESH;  Surgeon: Rolm Bookbinder, MD;  Location: [facility];  Service: General;  Laterality: N/A;   KNEE ARTHROSCOPY     right knee   LAPAROSCOPY N/A 05/30/2015   Procedure: LAPAROSCOPY DIAGNOSTIC;  Surgeon: Rolm Bookbinder, MD;  Location: [facility];  Service: General;  Laterality: N/A;   PARTIAL KNEE ARTHROPLASTY Right 10/30/2017   Procedure: UNICOMPARTMENTAL RIGHT KNEE  LATERAL;  Surgeon: Paralee Cancel, MD;  Location: [facility];  Service: Orthopedics;  Laterality: Right;  90 mins   PARTIAL KNEE ARTHROPLASTY Left 06/29/2020   Procedure: UNICOMPARTMENTAL KNEE LATERALLY;  Surgeon: Paralee Cancel, MD;  Location: [facility];  Service: Orthopedics;  Laterality: Left;  70 MINS   TONSILLECTOMY     adenoids    There were no vitals filed for this visit.    Subjective Assessment - 09/05/20 1617     Subjective Patient has a new order for [place] to evaluate and treat her back pain.  she reports that she has had worsening LBP in the past 3 weeks or so.  She has had some falls.  We have seen her for LBP in the past, she has DDD and some arthritis.  We have been seeing her for a left partial knee replacement, she had a few falls at home and resorted to using a walker and walking very slow recently    Patient Stated Goals have less back pain    Currently in Pain? Yes    Pain Score 8     Pain Location Back    Pain Orientation Lower    Pain Descriptors / Indicators Sore;Sharp    Pain Onset 1 to  4 weeks ago    Aggravating Factors  walking, standing pain up to 9/10    Pain Relieving Factors pillow behind back when sitting, OTC pain meds  at best pain a 5/10    Effect of Pain on Daily Activities difficulty with everything                [facility] PT Assessment - 09/05/20 0001       Assessment   Medical Diagnosis LBP    Referring Provider (PT) Copland    Onset Date/Surgical Date 08/22/20      Home Environment   Additional Comments single story, does cooking and cleaning, currently living with parents until she recovers      Prior Function   Level of Independence Independent    Vocation On disability      AROM   Overall AROM Comments lumbar ROM decreased 75% very guarded with motions, reports discomfort      Palpation   Palpation comment she is very tight and very tender in the lumbar parapsinals, worse on the right                        Objective  measurements completed on examination: See above findings.       [facility] Adult PT Treatment/Exercise - 09/05/20 0001       Ambulation/Gait   Gait Comments with SPC and light HHA trying to speed her up 120 feet, then without the cane with light single HHA again cues for step length and speed      Knee/Hip Exercises: Aerobic   Nustep level 4 x 6 minutes      Moist Heat Therapy   Number Minutes Moist Heat 15 Minutes    Moist Heat Location Lumbar Spine      Electrical Stimulation   Electrical Stimulation Location right lumbar area    Electrical Stimulation Action IFC    Electrical Stimulation Parameters supine    Electrical Stimulation Goals Pain      Vasopneumatic   Number Minutes Vasopneumatic  15 minutes    Vasopnuematic Location  Knee    Vasopneumatic Pressure Medium    Vasopneumatic Temperature  34                      PT Short Term Goals - 07/13/20 1753       PT SHORT TERM GOAL #1   Title independent with initial HEP    Status Achieved               PT Long Term Goals - 09/05/20 1655       PT LONG TERM GOAL #5   Title decrease LBP 50%    Time 8    Period Weeks    Status New                    Plan - 09/05/20 1630     Clinical Impression Statement Patient has a new order for [place] to treat the low back pain.  She has been having this for a few weeks, possibly from being so stiff and guarded from her fear of falls after having a few recent ones, she has limited lumbar ROM and a fear of motions.  She is very tight in the lumbar paraspinals, she is tender here as well.  We have been seeing her for the partial knee replacement, she is still very fearful of falling and is very slow and guarded  with walking, today I did some light HHA walking and this seemed to speed her up and have a more natural cadence,    PT Treatment/Interventions ADLs/Self Care Home Management;Cryotherapy;Electrical Stimulation;Ultrasound;Gait training;Neuromuscular  re-education;Balance training;Therapeutic exercise;Therapeutic activities;Stair training;Functional mobility training;Patient/family education;Manual techniques;Vasopneumatic Device    PT Next Visit Plan added LBP to the treatment diagnosis and the plan    Consulted and Agree with Plan of Care Patient             Patient will benefit from skilled therapeutic intervention in order to improve the following deficits and impairments:  Abnormal gait, Decreased range of motion, Difficulty walking, Decreased activity tolerance, Pain, Impaired flexibility, Decreased balance, Decreased mobility, Decreased strength, Increased edema, Decreased scar mobility, Postural dysfunction, Increased muscle spasms  Visit Diagnosis: Difficulty in walking, not elsewhere classified  Localized edema  Acute pain of left knee  Acute bilateral low back pain without sciatica     Problem List Patient Active Problem List   Diagnosis Date Noted   S/P left unicompartmental knee replacement, lateral 06/29/2020   At risk for obstructive sleep apnea 06/11/2019   Cough 06/11/2019   Multifocal pneumonia    Acute respiratory failure with hypoxia ([place]) 05/26/2019   Hyperglycemia 05/26/2019   Acute respiratory failure with hypoxemia ([place]) 05/23/2019   Acute hypoxemic respiratory failure ([place]) 05/20/2019   Fibromyalgia 03/09/2019   Neck pain, chronic 03/09/2019   Acute shoulder bursitis, right 02/03/2019   Bile salt-induced diarrhea 09/25/2018   Arthritis of left acromioclavicular joint 09/23/2018   Precordial chest pain 08/11/2018   Morbid obesity ([place]) 10/31/2017   S/P right UKR, lateral 10/30/2017   Incarcerated epigastric hernia 05/30/2015   Left foot pain 08/09/2014   Chronic diarrhea 07/27/2014   Gout 08/10/2012   Acid reflux 01/22/2012   Down syndrome 11/11/2011   Hernia of abdominal wall 10/21/2011   Psoriasis 10/21/2011   Type 2 diabetes mellitus, uncontrolled ([place]) 04/12/2011   Hypothyroid  04/12/2011   Gait abnormality 06/22/2010   Tibial tendinitis, posterior 06/22/2010    Sumner Boast., PT 09/05/2020, 4:57 PM  [place]. [place], [place], [address] Phone: [phone]   Fax:  [phone]  Name: Wanda Oneill MRN: [id number] Date of Birth: 09/05/1980

## 2020-09-07 ENCOUNTER — Ambulatory Visit: Payer: Medicare HMO

## 2020-09-07 ENCOUNTER — Other Ambulatory Visit: Payer: Self-pay

## 2020-09-07 DIAGNOSIS — M25662 Stiffness of left knee, not elsewhere classified: Secondary | ICD-10-CM | POA: Diagnosis not present

## 2020-09-07 DIAGNOSIS — R296 Repeated falls: Secondary | ICD-10-CM | POA: Diagnosis not present

## 2020-09-07 DIAGNOSIS — M25562 Pain in left knee: Secondary | ICD-10-CM

## 2020-09-07 DIAGNOSIS — R262 Difficulty in walking, not elsewhere classified: Secondary | ICD-10-CM | POA: Diagnosis not present

## 2020-09-07 DIAGNOSIS — M545 Low back pain, unspecified: Secondary | ICD-10-CM | POA: Diagnosis not present

## 2020-09-07 DIAGNOSIS — R600 Localized edema: Secondary | ICD-10-CM

## 2020-09-07 NOTE — Therapy (Signed)
[place]. [place], [place], [address] Phone: [phone]   Fax:  [phone]  Physical Therapy Treatment  Patient Details  Name: Wanda Oneill MRN: [id number] Date of Birth: [birth year] Referring Provider (PT): Copland   Encounter Date: 09/07/2020   PT End of Session - 09/07/20 1710     Visit Number 18    Number of Visits 29    Date for PT Re-Evaluation 10/06/20    Authorization Type [employer]    PT Start Time 1700    PT Stop Time [id number]    PT Time Calculation (min) 46 min    Activity Tolerance Patient tolerated treatment well    Behavior During Therapy WFL for tasks assessed/performed             Past Medical History:  Diagnosis Date   Diabetes mellitus    type II   Down syndrome    Family history of adverse reaction to anesthesia    mom has had n/v   Gastritis    Headache    Hepatic steatosis    Hidradenitis    Hypothyroidism    Irritable bowel syndrome    Periumbilical hernia    Pneumonia 03/2020   frequent    Restless legs    Tendonitis    chronic in left foot   Thyroid disease    hypothyroidism    Past Surgical History:  Procedure Laterality Date   AXILLARY HIDRADENITIS EXCISION Bilateral    CHOLECYSTECTOMY     HERNIA REPAIR     multiple   INCISIONAL HERNIA REPAIR  05/30/2015   LAPROSCOPIC   INCISIONAL HERNIA REPAIR N/A 05/30/2015   Procedure: LAPAROSCOPIC INCISIONAL HERNIA;  Surgeon: Rolm Bookbinder, MD;  Location: [facility];  Service: General;  Laterality: N/A;   INSERTION OF MESH N/A 05/30/2015   Procedure: INSERTION OF MESH;  Surgeon: Rolm Bookbinder, MD;  Location: [facility];  Service: General;  Laterality: N/A;   KNEE ARTHROSCOPY     right knee   LAPAROSCOPY N/A 05/30/2015   Procedure: LAPAROSCOPY DIAGNOSTIC;  Surgeon: Rolm Bookbinder, MD;  Location: [facility];  Service: General;  Laterality: N/A;   PARTIAL KNEE ARTHROPLASTY Right 10/30/2017   Procedure: UNICOMPARTMENTAL RIGHT KNEE  LATERAL;  Surgeon: Paralee Cancel, MD;  Location: [facility];  Service: Orthopedics;  Laterality: Right;  90 mins   PARTIAL KNEE ARTHROPLASTY Left 06/29/2020   Procedure: UNICOMPARTMENTAL KNEE LATERALLY;  Surgeon: Paralee Cancel, MD;  Location[facility];  Service: Orthopedics;  Laterality: Left;  70 MINS   TONSILLECTOMY     adenoids    There were no vitals filed for this visit.   Subjective Assessment - 09/07/20 1708     Subjective Patient reports her back feels ok right now. She did have a pain pill recently. She wants to get off her cane. She goes to the beach in 2 weeks to [place].    Patient Stated Goals have less back pain    Currently in Pain? No/denies    Pain Score 0-No pain    Pain Location Back    Pain Orientation Lower    Pain Onset 1 to 4 weeks ago                               [facility] Adult PT Treatment/Exercise - 09/07/20 0001       Knee/Hip Exercises: Aerobic   Nustep L5 x 6' UE/LE  Knee/Hip Exercises: Machines for Strengthening   Cybex Knee Extension attempted 10#, but painful to L LE, 5# using L LE with some assist from R LE 2x10    Cybex Knee Flexion 15# 2 sets 10 LLE only   1 warm up set with BLE 15#     Knee/Hip Exercises: Standing   Heel Raises Both;1 set;15 reps    Other Standing Knee Exercises side stepping in  bars with RTB above knees 4 laps      Moist Heat Therapy   Number Minutes Moist Heat 5 Minutes   started, but mom came back and let Jakyrah know they needed to leave   Moist Heat Location Lumbar Spine                      PT Short Term Goals - 07/13/20 1753       PT SHORT TERM GOAL #1   Title independent with initial HEP    Status Achieved               PT Long Term Goals - 09/05/20 1655       PT LONG TERM GOAL #5   Title decrease LBP 50%    Time 8    Period Weeks    Status New                   Plan - 09/07/20 1711     Clinical Impression Statement Kasara presents with no c/o LBP today,  but continued guarded gait with SPC. She tolerated treatment well, returning to lower body strengthening using cybex flex/ext machine and adding standing heel raises and side stepping in parallel bars. Began heat on low back, but pt's mom stated they had to get to another engagement. Will resume with heat and ice next visit.    PT Treatment/Interventions ADLs/Self Care Home Management;Cryotherapy;Electrical Stimulation;Ultrasound;Gait training;Neuromuscular re-education;Balance training;Therapeutic exercise;Therapeutic activities;Stair training;Functional mobility training;Patient/family education;Manual techniques;Vasopneumatic Device    PT Next Visit Plan continue with lower body strenghtening, core stabilization for low back pain    Consulted and Agree with Plan of Care Patient             Patient will benefit from skilled therapeutic intervention in order to improve the following deficits and impairments:  Abnormal gait, Decreased range of motion, Difficulty walking, Decreased activity tolerance, Pain, Impaired flexibility, Decreased balance, Decreased mobility, Decreased strength, Increased edema, Decreased scar mobility, Postural dysfunction, Increased muscle spasms  Visit Diagnosis: Difficulty in walking, not elsewhere classified  Localized edema  Acute pain of left knee  Acute bilateral low back pain without sciatica  Knee stiff, left     Problem List Patient Active Problem List   Diagnosis Date Noted   S/P left unicompartmental knee replacement, lateral 06/29/2020   At risk for obstructive sleep apnea 06/11/2019   Cough 06/11/2019   Multifocal pneumonia    Acute respiratory failure with hypoxia ([place]) 05/26/2019   Hyperglycemia 05/26/2019   Acute respiratory failure with hypoxemia ([place]) 05/23/2019   Acute hypoxemic respiratory failure ([place]) 05/20/2019   Fibromyalgia 03/09/2019   Neck pain, chronic 03/09/2019   Acute shoulder bursitis, right 02/03/2019   Bile  salt-induced diarrhea 09/25/2018   Arthritis of left acromioclavicular joint 09/23/2018   Precordial chest pain 08/11/2018   Morbid obesity ([place]) 10/31/2017   S/P right UKR, lateral 10/30/2017   Incarcerated epigastric hernia 05/30/2015   Left foot pain 08/09/2014   Chronic diarrhea 07/27/2014   Gout 08/10/2012   Acid  reflux 01/22/2012   Down syndrome 11/11/2011   Hernia of abdominal wall 10/21/2011   Psoriasis 10/21/2011   Type 2 diabetes mellitus, uncontrolled ([place]) 04/12/2011   Hypothyroid 04/12/2011   Gait abnormality 06/22/2010   Tibial tendinitis, posterior 06/22/2010    Izell Toad Hop, PT, DPT 09/07/2020, 6:03 PM  [place]. [place], [place], [address] Phone: [phone]   Fax:  [phone]  Name: LASHONTE WIGHT MRN: [id number] Date of Birth: 12-02-1980

## 2020-09-12 ENCOUNTER — Ambulatory Visit: Payer: Medicare HMO | Admitting: Physical Therapy

## 2020-09-13 ENCOUNTER — Other Ambulatory Visit: Payer: Self-pay

## 2020-09-13 ENCOUNTER — Ambulatory Visit: Payer: Medicare HMO | Admitting: Physical Therapy

## 2020-09-13 ENCOUNTER — Encounter: Payer: Self-pay | Admitting: Physical Therapy

## 2020-09-13 DIAGNOSIS — R262 Difficulty in walking, not elsewhere classified: Secondary | ICD-10-CM

## 2020-09-13 DIAGNOSIS — M545 Low back pain, unspecified: Secondary | ICD-10-CM | POA: Diagnosis not present

## 2020-09-13 DIAGNOSIS — R600 Localized edema: Secondary | ICD-10-CM | POA: Diagnosis not present

## 2020-09-13 DIAGNOSIS — M25562 Pain in left knee: Secondary | ICD-10-CM

## 2020-09-13 DIAGNOSIS — M25662 Stiffness of left knee, not elsewhere classified: Secondary | ICD-10-CM | POA: Diagnosis not present

## 2020-09-13 DIAGNOSIS — R296 Repeated falls: Secondary | ICD-10-CM | POA: Diagnosis not present

## 2020-09-13 NOTE — Therapy (Signed)
[place]. [place], [place], [address] Phone: [phone]   Fax:  [phone]  Physical Therapy Treatment  Patient Details  Name: Wanda Oneill MRN: [id number] Date of Birth: 05-25-1980 Referring Provider (PT): Copland   Encounter Date: 09/13/2020   PT End of Session - 09/13/20 1702     Visit Number 19    Number of Visits 29    Date for PT Re-Evaluation 10/06/20    PT Start Time 3557    PT Stop Time 3220    PT Time Calculation (min) 57 min    Activity Tolerance Patient tolerated treatment well    Behavior During Therapy [facility] for tasks assessed/performed             Past Medical History:  Diagnosis Date   Diabetes mellitus    type II   Down syndrome    Family history of adverse reaction to anesthesia    mom has had n/v   Gastritis    Headache    Hepatic steatosis    Hidradenitis    Hypothyroidism    Irritable bowel syndrome    Periumbilical hernia    Pneumonia 03/2020   frequent    Restless legs    Tendonitis    chronic in left foot   Thyroid disease    hypothyroidism    Past Surgical History:  Procedure Laterality Date   AXILLARY HIDRADENITIS EXCISION Bilateral    CHOLECYSTECTOMY     HERNIA REPAIR     multiple   INCISIONAL HERNIA REPAIR  05/30/2015   LAPROSCOPIC   INCISIONAL HERNIA REPAIR N/A 05/30/2015   Procedure: LAPAROSCOPIC INCISIONAL HERNIA;  Surgeon: Rolm Bookbinder, MD;  Location: [facility];  Service: General;  Laterality: N/A;   INSERTION OF MESH N/A 05/30/2015   Procedure: INSERTION OF MESH;  Surgeon: Rolm Bookbinder, MD;  Location: [facility];  Service: General;  Laterality: N/A;   KNEE ARTHROSCOPY     right knee   LAPAROSCOPY N/A 05/30/2015   Procedure: LAPAROSCOPY DIAGNOSTIC;  Surgeon: Rolm Bookbinder, MD;  Location: [facility];  Service: General;  Laterality: N/A;   PARTIAL KNEE ARTHROPLASTY Right 10/30/2017   Procedure: UNICOMPARTMENTAL RIGHT KNEE LATERAL;  Surgeon: Paralee Cancel, MD;  [facility];  Service: Orthopedics;  Laterality: Right;  90 mins   PARTIAL KNEE ARTHROPLASTY Left 06/29/2020   Procedure: UNICOMPARTMENTAL KNEE LATERALLY;  Surgeon: Paralee Cancel, MD;  [facility];  Service: Orthopedics;  Laterality: Left;  70 MINS   TONSILLECTOMY     adenoids    There were no vitals filed for this visit.   Subjective Assessment - 09/13/20 1626     Subjective Patient comes in with her mom, they saw MD last week and Honor is having continued back pain.  We have one more visit next week and she comes in walking without device and lookds good.    Currently in Pain? Yes    Pain Score 3     Pain Location Back    Pain Orientation Mid;Lower;Right    Pain Descriptors / Indicators Tightness;Sore;Sharp    Aggravating Factors  bending over                               [facility] Adult PT Treatment/Exercise - 09/13/20 0001       Ambulation/Gait   Gait Comments gait in the clini without device, stairs trying step over step with 4" and  6" up and down      High Level Balance   High Level Balance Activities Negotitating around obstacles;Negotiating over obstacles;Backward walking;Side stepping    High Level Balance Comments cone touches, ball toss, on airex head turns and reaches      Knee/Hip Exercises: Machines for Strengthening   Cybex Knee Extension 5# 2x10    Cybex Knee Flexion 20# 2x10      Electrical Stimulation   Electrical Stimulation Location right lumbar area    Electrical Stimulation Action IFC    Electrical Stimulation Parameters supine    Electrical Stimulation Goals Pain      Vasopneumatic   Number Minutes Vasopneumatic  15 minutes    Vasopnuematic Location  Knee    Vasopneumatic Pressure Medium    Vasopneumatic Temperature  34                      PT Short Term Goals - 07/13/20 1753       PT SHORT TERM GOAL #1   Title independent with initial HEP    Status Achieved               PT Long  Term Goals - 09/13/20 1720       PT LONG TERM GOAL #2   Title dcrease pain 50%    Status Partially Met      PT LONG TERM GOAL #3   Title increase left knee AROM to 0-120 degrees flexion    Status Partially Met                   Plan - 09/13/20 1705     Clinical Impression Statement Patient and her mom come in today as she is having back pain again, We discussed x-rays and the worst case scenarios and the mom decided to wait on going to MD and doing x-rays.  I focused a lot of the treatment today on balance to prevent falls, she is very hesitant and unsteady on dynamic surfaces., her first few steps are also very slow and hesitant.  The back pain is better with the heat and stim.    PT Next Visit Plan work on the balance some, they will be on vacation after next week    Consulted and Agree with Plan of Care Patient             Patient will benefit from skilled therapeutic intervention in order to improve the following deficits and impairments:  Abnormal gait, Decreased range of motion, Difficulty walking, Decreased activity tolerance, Pain, Impaired flexibility, Decreased balance, Decreased mobility, Decreased strength, Increased edema, Decreased scar mobility, Postural dysfunction, Increased muscle spasms  Visit Diagnosis: Difficulty in walking, not elsewhere classified  Localized edema  Acute pain of left knee  Acute bilateral low back pain without sciatica  Knee stiff, left     Problem List Patient Active Problem List   Diagnosis Date Noted   S/P left unicompartmental knee replacement, lateral 06/29/2020   At risk for obstructive sleep apnea 06/11/2019   Cough 06/11/2019   Multifocal pneumonia    Acute respiratory failure with hypoxia ([place]) 05/26/2019   Hyperglycemia 05/26/2019   Acute respiratory failure with hypoxemia ([place]) 05/23/2019   Acute hypoxemic respiratory failure ([place]) 05/20/2019   Fibromyalgia 03/09/2019   Neck pain, chronic 03/09/2019    Acute shoulder bursitis, right 02/03/2019   Bile salt-induced diarrhea 09/25/2018   Arthritis of left acromioclavicular joint 09/23/2018   Precordial chest pain 08/11/2018  Morbid obesity ([place]) 10/31/2017   S/P right UKR, lateral 10/30/2017   Incarcerated epigastric hernia 05/30/2015   Left foot pain 08/09/2014   Chronic diarrhea 07/27/2014   Gout 08/10/2012   Acid reflux 01/22/2012   Down syndrome 11/11/2011   Hernia of abdominal wall 10/21/2011   Psoriasis 10/21/2011   Type 2 diabetes mellitus, uncontrolled ([place]) 04/12/2011   Hypothyroid 04/12/2011   Gait abnormality 06/22/2010   Tibial tendinitis, posterior 06/22/2010    Sumner Boast., PT 09/13/2020, 5:21 PM  [place]. [place], [place], [address] Phone: [phone]   Fax:  [phone]  Name: Wanda Oneill MRN: [id number] Date of Birth: [birth year]

## 2020-09-14 ENCOUNTER — Ambulatory Visit: Payer: Medicare HMO | Admitting: Physical Therapy

## 2020-09-14 DIAGNOSIS — R600 Localized edema: Secondary | ICD-10-CM | POA: Diagnosis not present

## 2020-09-14 DIAGNOSIS — M545 Low back pain, unspecified: Secondary | ICD-10-CM

## 2020-09-14 DIAGNOSIS — R262 Difficulty in walking, not elsewhere classified: Secondary | ICD-10-CM

## 2020-09-14 DIAGNOSIS — M25562 Pain in left knee: Secondary | ICD-10-CM | POA: Diagnosis not present

## 2020-09-14 DIAGNOSIS — R296 Repeated falls: Secondary | ICD-10-CM | POA: Diagnosis not present

## 2020-09-14 DIAGNOSIS — M25662 Stiffness of left knee, not elsewhere classified: Secondary | ICD-10-CM | POA: Diagnosis not present

## 2020-09-14 NOTE — Therapy (Signed)
[facility] [place], [place], [address] Phone: [phone]   Fax:  [phone] Progress Note Reporting Period 08/10/20 to 09/14/20 for visit 11-20  See note below for Objective Data and Assessment of Progress/Goals.    [facility] PT Assessment - 09/14/20 0001       AROM   Left Knee Extension 4    Left Knee Flexion 110             Physical Therapy Treatment  Patient Details  Name: Wanda Oneill MRN: [id number] Date of Birth: Sep 25, 1980 Referring Provider (PT): Copland   Encounter Date: 09/14/2020   PT End of Session - 09/14/20 1651     Visit Number 20    Number of Visits 29    Date for PT Re-Evaluation 10/06/20    PT Start Time 7048    PT Stop Time 8891    PT Time Calculation (min) 55 min             Past Medical History:  Diagnosis Date   Diabetes mellitus    type II   Down syndrome    Family history of adverse reaction to anesthesia    mom has had n/v   Gastritis    Headache    Hepatic steatosis    Hidradenitis    Hypothyroidism    Irritable bowel syndrome    Periumbilical hernia    Pneumonia 03/2020   frequent    Restless legs    Tendonitis    chronic in left foot   Thyroid disease    hypothyroidism    Past Surgical History:  Procedure Laterality Date   AXILLARY HIDRADENITIS EXCISION Bilateral    CHOLECYSTECTOMY     HERNIA REPAIR     multiple   INCISIONAL HERNIA REPAIR  05/30/2015   LAPROSCOPIC   INCISIONAL HERNIA REPAIR N/A 05/30/2015   Procedure: LAPAROSCOPIC INCISIONAL HERNIA;  Surgeon: Rolm Bookbinder, MD;  Location: [facility];  Service: General;  Laterality: N/A;   INSERTION OF MESH N/A 05/30/2015   Procedure: INSERTION OF MESH;  Surgeon: Rolm Bookbinder, MD;  Location: [facility];  Service: General;  Laterality: N/A;   KNEE ARTHROSCOPY     right knee   LAPAROSCOPY N/A 05/30/2015   Procedure: LAPAROSCOPY DIAGNOSTIC;  Surgeon: Rolm Bookbinder, MD;  Location: [facility];  Service:  General;  Laterality: N/A;   PARTIAL KNEE ARTHROPLASTY Right 10/30/2017   Procedure: UNICOMPARTMENTAL RIGHT KNEE LATERAL;  Surgeon: Paralee Cancel, MD;  Location: [facility];  Service: Orthopedics;  Laterality: Right;  90 mins   PARTIAL KNEE ARTHROPLASTY Left 06/29/2020   Procedure: UNICOMPARTMENTAL KNEE LATERALLY;  Surgeon: Paralee Cancel, MD;  Location: [facility];  Service: Orthopedics;  Laterality: Left;  70 MINS   TONSILLECTOMY     adenoids    There were no vitals filed for this visit.   Subjective Assessment - 09/14/20 1624     Subjective amb in with SPC using partially    Currently in Pain? Yes    Pain Score 3                 [facility] PT Assessment - 09/14/20 0001       AROM   Left Knee Extension 4    Left Knee Flexion 110                           [facility] Adult PT Treatment/Exercise - 09/14/20 0001  Ambulation/Gait   Gait Comments amb outside without AD and did very well but slow, with SPC to navigate curb -fearful and still needed extra time and HHA      Knee/Hip Exercises: Aerobic   Nustep L 5 6 min LE only      Knee/Hip Exercises: Standing   Other Standing Knee Exercises stepping on airex 10 x BIL with SPC and HHA as well as off airex fwd and laterally off   pt very fearful esp with stepping with LLE     Knee/Hip Exercises: Seated   Long Arc Quad Strengthening;Left;2 sets;10 reps;Weights    Long Arc Quad Weight 3 lbs.    Other Seated Knee/Hip Exercises trunk flex and ext green tband 15 x    Sit to Sand 10 reps;without UE support   on airex with wt ball press CGA     Moist Heat Therapy   Number Minutes Moist Heat 15 Minutes    Moist Heat Location Lumbar Spine      Electrical Stimulation   Electrical Stimulation Location right lumbar area    Electrical Stimulation Action IFC    Electrical Stimulation Parameters supine    Electrical Stimulation Goals Pain      Vasopneumatic   Number Minutes Vasopneumatic  15 minutes    Vasopnuematic Location   Knee    Vasopneumatic Pressure Medium    Vasopneumatic Temperature  34                      PT Short Term Goals - 07/13/20 1753       PT SHORT TERM GOAL #1   Title independent with initial HEP    Status Achieved               PT Long Term Goals - 09/14/20 1651       PT LONG TERM GOAL #1   Title understand RICE    Status Achieved      PT LONG TERM GOAL #2   Title dcrease pain 50%    Status Achieved      PT LONG TERM GOAL #3   Title increase left knee AROM to 0-120 degrees flexion    Status Achieved      PT LONG TERM GOAL #4   Title walk on all surfaces without limp and with minimal; pain    Baseline still hesitation and fearful with curbs and transitions    Status Partially Met      PT LONG TERM GOAL #5   Title decrease LBP 50%    Status Partially Met                   Plan - 09/14/20 1654     Clinical Impression Statement progressing with goals. increased ROM and tolerated full ROM and increased quad strength noted by full ext. balance is still an issue as well as fear with stepiing up or over obstacles.    PT Treatment/Interventions ADLs/Self Care Home Management;Cryotherapy;Electrical Stimulation;Ultrasound;Gait training;Neuromuscular re-education;Balance training;Therapeutic exercise;Therapeutic activities;Stair training;Functional mobility training;Patient/family education;Manual techniques;Vasopneumatic Device    PT Next Visit Plan D/C next session             Patient will benefit from skilled therapeutic intervention in order to improve the following deficits and impairments:  Abnormal gait, Decreased range of motion, Difficulty walking, Decreased activity tolerance, Pain, Impaired flexibility, Decreased balance, Decreased mobility, Decreased strength, Increased edema, Decreased scar mobility, Postural dysfunction, Increased muscle spasms  Visit Diagnosis: Difficulty in walking,  not elsewhere classified  Localized  edema  Acute pain of left knee  Acute bilateral low back pain without sciatica     Problem List Patient Active Problem List   Diagnosis Date Noted   S/P left unicompartmental knee replacement, lateral 06/29/2020   At risk for obstructive sleep apnea 06/11/2019   Cough 06/11/2019   Multifocal pneumonia    Acute respiratory failure with hypoxia ([facility]) 05/26/2019   Hyperglycemia 05/26/2019   Acute respiratory failure with hypoxemia ([facility]) 05/23/2019   Acute hypoxemic respiratory failure ([facility]) 05/20/2019   Fibromyalgia 03/09/2019   Neck pain, chronic 03/09/2019   Acute shoulder bursitis, right 02/03/2019   Bile salt-induced diarrhea 09/25/2018   Arthritis of left acromioclavicular joint 09/23/2018   Precordial chest pain 08/11/2018   Morbid obesity ([facility]) 10/31/2017   S/P right UKR, lateral 10/30/2017   Incarcerated epigastric hernia 05/30/2015   Left foot pain 08/09/2014   Chronic diarrhea 07/27/2014   Gout 08/10/2012   Acid reflux 01/22/2012   Down syndrome 11/11/2011   Hernia of abdominal wall 10/21/2011   Psoriasis 10/21/2011   Type 2 diabetes mellitus, uncontrolled ([facility]) 04/12/2011   Hypothyroid 04/12/2011   Gait abnormality 06/22/2010   Tibial tendinitis, posterior 06/22/2010    Sumner Boast PTA 09/14/2020, 5:18 PM  [facility]. [place], [place], [address] Phone: [phone]   Fax:  [phone]  Name: Wanda Oneill MRN: [id number] Date of Birth: 07-08-1980

## 2020-09-19 ENCOUNTER — Ambulatory Visit: Payer: Medicare HMO | Admitting: Physical Therapy

## 2020-09-20 ENCOUNTER — Ambulatory Visit: Payer: Medicare HMO | Admitting: Family Medicine

## 2020-10-01 NOTE — Progress Notes (Signed)
[facility] [place], [place], [place] [address] [phone]  Date:  10/02/2020   Name:  Wanda Oneill   DOB:  Sep 01, 1980   MRN:  [id number]  PCP:  Darreld Mclean, MD    Chief Complaint: Fall (Frequent falls, lower back pain)   History of Present Illness:  Wanda Oneill is a 40 y.o. very pleasant female patient who presents with the following:  Wanda Oneill is a charming woman with Down syndrome, daughter of my former partner Dr. Ruben Reason History of diabetes, hypothyroidism, obesity, Down syndrome and several associated MSK problems Patient seen today for concern of back pain after recent fall I saw her most recently August 1 for concern of menstrual changes  She had knee surgery in May of this year She took a fall on the sand during a recent beach visit - she feels like her left knee/ankle ankle gave out. She did not hit her head or seem to have any particular injury, but she has complained of back pain more since the fall.  She unfortunately has suffered several falls since she had her knee surgery She notes pain in both sides of her back The pain is not radiated into her legs or any numbness   She has not had any dedicated back films that we can recall  In addition, she has a small abscess on her left anterior inferior abdomen.  She also has gradually picked off her right long finger nail.  It is not particularly painful, they just wonder if anything needs to be done for it  Lab Results  Component Value Date   HGBA1C 9.1 (H) 06/23/2020    Patient Active Problem List   Diagnosis Date Noted   S/P left unicompartmental knee replacement, lateral 06/29/2020   At risk for obstructive sleep apnea 06/11/2019   Cough 06/11/2019   Multifocal pneumonia    Acute respiratory failure with hypoxia ([facility]) 05/26/2019   Hyperglycemia 05/26/2019   Acute respiratory failure with hypoxemia ([facility]) 05/23/2019   Acute  hypoxemic respiratory failure ([facility]) 05/20/2019   Fibromyalgia 03/09/2019   Neck pain, chronic 03/09/2019   Acute shoulder bursitis, right 02/03/2019   Bile salt-induced diarrhea 09/25/2018   Arthritis of left acromioclavicular joint 09/23/2018   Precordial chest pain 08/11/2018   Morbid obesity ([facility]) 10/31/2017   S/P right UKR, lateral 10/30/2017   Incarcerated epigastric hernia 05/30/2015   Left foot pain 08/09/2014   Chronic diarrhea 07/27/2014   Gout 08/10/2012   Acid reflux 01/22/2012   Down syndrome 11/11/2011   Hernia of abdominal wall 10/21/2011   Psoriasis 10/21/2011   Type 2 diabetes mellitus, uncontrolled ([facility]) 04/12/2011   Hypothyroid 04/12/2011   Gait abnormality 06/22/2010   Tibial tendinitis, posterior 06/22/2010    Past Medical History:  Diagnosis Date   Diabetes mellitus    type II   Down syndrome    Family history of adverse reaction to anesthesia    mom has had n/v   Gastritis    Headache    Hepatic steatosis    Hidradenitis    Hypothyroidism    Irritable bowel syndrome    Periumbilical hernia    Pneumonia 03/2020   frequent    Restless legs    Tendonitis    chronic in left foot   Thyroid disease    hypothyroidism    Past Surgical History:  Procedure Laterality Date   AXILLARY HIDRADENITIS EXCISION Bilateral  CHOLECYSTECTOMY     HERNIA REPAIR     multiple   INCISIONAL HERNIA REPAIR  05/30/2015   LAPROSCOPIC   INCISIONAL HERNIA REPAIR N/A 05/30/2015   Procedure: LAPAROSCOPIC INCISIONAL HERNIA;  Surgeon: Rolm Bookbinder, MD;  Location: [facility];  Service: General;  Laterality: N/A;   INSERTION OF MESH N/A 05/30/2015   Procedure: INSERTION OF MESH;  Surgeon: Rolm Bookbinder, MD;  Location: [facility];  Service: General;  Laterality: N/A;   KNEE ARTHROSCOPY     right knee   LAPAROSCOPY N/A 05/30/2015   Procedure: LAPAROSCOPY DIAGNOSTIC;  Surgeon: Rolm Bookbinder, MD;  Location: [facility];  Service: General;  Laterality: N/A;   PARTIAL KNEE  ARTHROPLASTY Right 10/30/2017   Procedure: UNICOMPARTMENTAL RIGHT KNEE LATERAL;  Surgeon: Paralee Cancel, MD;  Location[facility];  Service: Orthopedics;  Laterality: Right;  90 mins   PARTIAL KNEE ARTHROPLASTY Left 06/29/2020   Procedure: UNICOMPARTMENTAL KNEE LATERALLY;  Surgeon: Paralee Cancel, MD;  Location[facility];  Service: Orthopedics;  Laterality: Left;  70 MINS   TONSILLECTOMY     adenoids    Social History   Tobacco Use   Smoking status: Never   Smokeless tobacco: Never  Vaping Use   Vaping Use: Never used  Substance Use Topics   Alcohol use: No    Alcohol/week: 0.0 standard drinks   Drug use: No    Family History  Problem Relation Age of Onset   Thyroid cancer Mother    Diabetes type II Mother    High Cholesterol Mother    Heart disease Mother    Diabetes type II Father    Hypertension Father    Stroke Father     Allergies  Allergen Reactions   Vancomycin Itching   Cefuroxime Axetil Hives   Sulfa Antibiotics Hives    Medication list has been reviewed and updated.  Current Outpatient Medications on File Prior to Visit  Medication Sig Dispense Refill   acetaminophen (TYLENOL) 325 MG tablet Take 650 mg by mouth every 6 (six) hours as needed for mild pain.      allopurinol (ZYLOPRIM) 300 MG tablet Take 1 tablet by mouth once daily 90 tablet 1   Blood Glucose Monitoring Suppl (BLOOD GLUCOSE METER KIT AND SUPPLIES) Dispense based on patient and insurance preference. To check blood sugars daily FOR ICD-9 250.00 (Patient taking differently: 1 each by Other route See admin instructions. Dispense based on patient and insurance preference. To check blood sugars daily FOR ICD-9 250.00) 1 each 0   Blood Glucose Monitoring Suppl (TRUE METRIX METER) w/Device KIT Use as directed to check glucose up to two times daily. Dx Code E11.9 1 kit 0   cholestyramine (QUESTRAN) 4 g packet DISSOLVE & TAKE 1 PACKET OF POWDER BY MOUTH TWICE DAILY (Patient taking differently: Take 4 g by mouth 2  (two) times daily.) 180 each 3   dicyclomine (BENTYL) 20 MG tablet Take 20 mg by mouth daily as needed for spasms.     empagliflozin (JARDIANCE) 25 MG TABS tablet Take 25 mg by mouth daily.     Glucose Blood (BLOOD GLUCOSE TEST STRIPS) STRP 1 each by Other route See admin instructions. Use to test blood sugar up to twice a day 100 strip 3   glucose blood (FREESTYLE TEST STRIPS) test strip Use as directed to test blood sugar up to twice a day. Dx code E11.9 100 each 12   glucose blood (TRUE METRIX BLOOD GLUCOSE TEST) test strip Use as instructed to check glucose  up to 2 times daily. Dx Code E11.9 100 each 12   HYDROcodone-acetaminophen (NORCO/VICODIN) 5-325 MG tablet Take 1-2 tablets by mouth every 6 (six) hours as needed for severe pain. 42 tablet 0   levonorgestrel-ethinyl estradiol (JOLESSA) 0.15-0.03 MG tablet Take 1 tablet by mouth daily. 84 tablet 3   levothyroxine (SYNTHROID) 137 MCG tablet TAKE 1 TABLET BY MOUTH ONCE DAILY BEFORE BREAKFAST 90 tablet 0   metFORMIN (GLUCOPHAGE) 1000 MG tablet Take 1,000 mg by mouth 2 (two) times daily.   2   methocarbamol (ROBAXIN) 500 MG tablet Take 1 tablet (500 mg total) by mouth every 6 (six) hours as needed for muscle spasms. 40 tablet 0   ONE TOUCH ULTRA TEST test strip USE TO CHECK BLOOD SUGAR ONCE DAILY (ICD CODE:25.00) (Patient taking differently: 1 each by Other route daily.) 100 each 3   TRUEplus Lancets 30G MISC Use as directed to check glucose up to two times daily. Dx code E11.9 782 each 3   TRULICITY 1.5 UM/3.5TI SOPN Inject 1.5 mg into the skin every Saturday.     VASCEPA 1 g capsule Take 2 g by mouth 2 (two) times daily.     No current facility-administered medications on file prior to visit.    Review of Systems:  As per HPI- otherwise negative.   Physical Examination: Vitals:   10/02/20 1555  BP: 122/80  Pulse: 84  Resp: 19  Temp: (!) 97.2 F (36.2 C)  SpO2: 99%   Vitals:   10/02/20 1555  Weight: 230 lb (104.3 kg)  Height:  '5\' 4"'  (1.626 m)   Body mass index is 39.48 kg/m. Ideal Body Weight: Weight in (lb) to have BMI = 25: 145.3  GEN: no acute distress.  Looks well and her normal self, obese HEENT: Atraumatic, Normocephalic.  Ears and Nose: No external deformity. CV: RRR, No M/G/R. No JVD. No thrill. No extra heart sounds. PULM: CTA B, no wheezes, crackles, rhonchi. No retractions. No resp. distress. No accessory muscle use. ABD: S, NT, ND, No rebound. No HSM. There is a small fluctuant abscess on the left lower abdomen EXTR: No c/c/e PSYCH: Normally interactive. Conversant.  Patient is currently ambulating with a cane.  Thoracolumbar flexion is limited due to pain.  Extension is normal Strength of both lower extremities is normal I cannot palpate point tenderness of her back, but she notes diffuse tenderness over the thoracolumbar junction bilaterally.  No rib tenderness is appreciated The right long finger has a missing nail.  The patient notes there was no trauma, she has gradually picked at this until the nail came off.  It does not appear to be infected  Verbal consent obtained.  Abscess was prepped with Betadine swabs and alcohol.  Anesthesia achieved with a small wheal of 1% lidocaine.  Used 11 blade to make a small incision over fluctuant area, pus expressed.  Area was dressed with a Band-Aid, hemostasis checked prior to discharge.  Estimated blood loss 2 mL.  No immediate complications  Assessment and Plan: Acute midline low back pain without sciatica - Plan: DG Lumbar Spine Complete, DG Thoracic Spine 2 View  Abscess of skin of abdomen - Plan: doxycycline (VIBRAMYCIN) 100 MG capsule  Patient seen today with a couple of concerns.  She has suffered a few falls recently and is having some back pain.  She is doing physical therapy, they would just like to make sure there is no occult fracture and also see how much arthritis she might have.  We will obtain plain films today  I&D of skin abscess as  described above.  We will treat with doxycycline for 10 days Discussed wound care, she will contact me if any concerns  This visit occurred during the SARS-CoV-2 public health emergency.  Safety protocols were in place, including screening questions prior to the visit, additional usage of staff PPE, and extensive cleaning of exam room while observing appropriate contact time as indicated for disinfecting solutions.   Signed Lamar Blinks, MD  Received her films as below, message to patient's mother who manages her health care DG Thoracic Spine 2 View  Result Date: 10/02/2020 CLINICAL DATA:  Pain after multiple fall EXAM: THORACIC SPINE 2 VIEWS COMPARISON:  None. FINDINGS: Mild scoliosis of lower thoracic spine. Sagittal alignment shows reversal of thoracic kyphosis. The vertebral body heights are grossly maintained. There are degenerative osteophytes of the lower thoracic spine. Mild chronic wedging of T11-T12 better seen on lumbar radiograph IMPRESSION: Scoliosis and degenerative change.  No acute osseous abnormality Electronically Signed   By: Donavan Foil M.D.   On: 10/02/2020 17:33   DG Lumbar Spine Complete  Result Date: 10/02/2020 CLINICAL DATA:  Multiple fall back pain EXAM: LUMBAR SPINE - COMPLETE 4+ VIEW COMPARISON:  09/01/2018 FINDINGS: Transitional anatomy. For the purposes of reporting, transitional segment will be lumbarized S1 and 5 lumbar type vertebra assumed. Dextroscoliosis of the spine. Evidence of prior hernia repair. Sagittal alignment within normal limits. Vertebral body heights are stable with mild chronic anterior wedging at T11 and T12. Multilevel degenerative change with mild disc space narrowing and degenerative osteophyte. Facet degenerative changes at multiple levels. IMPRESSION: 1. Scoliosis with multilevel mild degenerative change. Transitional anatomy as before 2. Mild chronic wedging of T11 and T12 Electronically Signed   By: Donavan Foil M.D.   On: 10/02/2020  17:32

## 2020-10-02 ENCOUNTER — Other Ambulatory Visit: Payer: Self-pay

## 2020-10-02 ENCOUNTER — Ambulatory Visit (INDEPENDENT_AMBULATORY_CARE_PROVIDER_SITE_OTHER): Payer: Medicare HMO | Admitting: Family Medicine

## 2020-10-02 ENCOUNTER — Ambulatory Visit (HOSPITAL_BASED_OUTPATIENT_CLINIC_OR_DEPARTMENT_OTHER)
Admission: RE | Admit: 2020-10-02 | Discharge: 2020-10-02 | Disposition: A | Discharge status: Home / Self Care | Payer: Medicare HMO | Source: Ambulatory Visit | Attending: Family Medicine | Admitting: Family Medicine

## 2020-10-02 VITALS — BP 122/80 | HR 84 | Temp 97.2°F | Resp 19 | Ht 64.0 in | Wt 230.0 lb

## 2020-10-02 DIAGNOSIS — M545 Low back pain, unspecified: Secondary | ICD-10-CM | POA: Insufficient documentation

## 2020-10-02 DIAGNOSIS — L02211 Cutaneous abscess of abdominal wall: Secondary | ICD-10-CM | POA: Diagnosis not present

## 2020-10-02 DIAGNOSIS — R079 Chest pain, unspecified: Secondary | ICD-10-CM | POA: Diagnosis not present

## 2020-10-02 MED ORDER — DOXYCYCLINE HYCLATE 100 MG PO CAPS: 100.0000 mg | ORAL_CAPSULE | Freq: Two times a day (BID) | ORAL | 0 refills | Status: DC

## 2020-10-02 NOTE — Patient Instructions (Signed)
It was good to see you again today! Please go by imaging on the ground floor to have x-rays of your back  We drained a small abscess on your belly today- keep it clean and dry today, then tomorrow you can shower and apply a fresh dressing It if does not seem to be clearly getting better please let me know  Use the doxycycline for 10 days as well for the infection

## 2020-10-03 ENCOUNTER — Ambulatory Visit: Payer: Medicare HMO | Admitting: Physical Therapy

## 2020-10-03 ENCOUNTER — Encounter: Payer: Self-pay | Admitting: Physical Therapy

## 2020-10-03 DIAGNOSIS — M25562 Pain in left knee: Secondary | ICD-10-CM

## 2020-10-03 DIAGNOSIS — R262 Difficulty in walking, not elsewhere classified: Secondary | ICD-10-CM | POA: Diagnosis not present

## 2020-10-03 DIAGNOSIS — R296 Repeated falls: Secondary | ICD-10-CM | POA: Diagnosis not present

## 2020-10-03 DIAGNOSIS — R600 Localized edema: Secondary | ICD-10-CM | POA: Diagnosis not present

## 2020-10-03 DIAGNOSIS — M25662 Stiffness of left knee, not elsewhere classified: Secondary | ICD-10-CM

## 2020-10-03 DIAGNOSIS — M545 Low back pain, unspecified: Secondary | ICD-10-CM

## 2020-10-03 NOTE — Therapy (Signed)
[place]. [place], [place], [address] Phone: [phone]   Fax:  [phone]  Physical Therapy Treatment  Patient Details  Name: Wanda Oneill MRN: [id number] Date of Birth: [birth year] Referring Provider (PT): Copland   Encounter Date: 10/03/2020   PT End of Session - 10/03/20 1513     Visit Number 21    Authorization Type [employer]    PT Start Time 5176    PT Stop Time 1607    PT Time Calculation (min) 48 min    Activity Tolerance Patient limited by pain    Behavior During Therapy [facility] for tasks assessed/performed             Past Medical History:  Diagnosis Date   Diabetes mellitus    type II   Down syndrome    Family history of adverse reaction to anesthesia    mom has had n/v   Gastritis    Headache    Hepatic steatosis    Hidradenitis    Hypothyroidism    Irritable bowel syndrome    Periumbilical hernia    Pneumonia 03/2020   frequent    Restless legs    Tendonitis    chronic in left foot   Thyroid disease    hypothyroidism    Past Surgical History:  Procedure Laterality Date   AXILLARY HIDRADENITIS EXCISION Bilateral    CHOLECYSTECTOMY     HERNIA REPAIR     multiple   INCISIONAL HERNIA REPAIR  05/30/2015   LAPROSCOPIC   INCISIONAL HERNIA REPAIR N/A 05/30/2015   Procedure: LAPAROSCOPIC INCISIONAL HERNIA;  Surgeon: Rolm Bookbinder, MD;  Location: [place];  Service: General;  Laterality: N/A;   INSERTION OF MESH N/A 05/30/2015   Procedure: INSERTION OF MESH;  Surgeon: Rolm Bookbinder, MD;  Location: [place];  Service: General;  Laterality: N/A;   KNEE ARTHROSCOPY     right knee   LAPAROSCOPY N/A 05/30/2015   Procedure: LAPAROSCOPY DIAGNOSTIC;  Surgeon: Rolm Bookbinder, MD;  Location: [place];  Service: General;  Laterality: N/A;   PARTIAL KNEE ARTHROPLASTY Right 10/30/2017   Procedure: UNICOMPARTMENTAL RIGHT KNEE LATERAL;  Surgeon: Paralee Cancel, MD;  [facility];  Service:  Orthopedics;  Laterality: Right;  90 mins   PARTIAL KNEE ARTHROPLASTY Left 06/29/2020   Procedure: UNICOMPARTMENTAL KNEE LATERALLY;  Surgeon: Paralee Cancel, MD;  [facility];  Service: Orthopedics;  Laterality: Left;  70 MINS   TONSILLECTOMY     adenoids    There were no vitals filed for this visit.   Subjective Assessment - 10/03/20 1448     Subjective Patient had a fall last w  [place] walking on sand, she has had about 3 other falls in the past 3 months.  She has had significant back pain as well, recent xrays due to the recent fall, were negative.  75 mom is present and has concerns about the falls, Khaylee has concerns about the falls, and the back pain.    Limitations Standing;Walking;House hold activities    Currently in Pain? Yes    Pain Score 7     Pain Location Knee   back pain   Pain Orientation Left    Pain Descriptors / Indicators Sore;Aching    Aggravating Factors  standing, stairs, walking makes the knee worse and the back pain worse, pain up to 9/10 at times    Pain Relieving Factors pain medicine, ice on the knee, heat on the back,  a comfortable chair pain at best a 3-4/10                [facility] PT Assessment - 10/03/20 0001       AROM   Left Knee Extension 5    Left Knee Flexion 100      Ambulation/Gait   Gait Comments using SPC, slow, uses furniture and walls, has some fear, step to pattern      Standardized Balance Assessment   Standardized Balance Assessment Berg Balance Test      Berg Balance Test   Sit to Stand Able to stand without using hands and stabilize independently    Standing Unsupported Able to stand safely 2 minutes    Sitting with Back Unsupported but Feet Supported on Floor or Stool Able to sit safely and securely 2 minutes    Stand to Sit Uses backs of legs against chair to control descent    Transfers Able to transfer safely, definite need of hands    Standing Unsupported with Eyes Closed Able to stand 10 seconds  safely    Standing Unsupported with Feet Together Able to place feet together independently and stand for 1 minute with supervision    From Standing, Reach Forward with Outstretched Arm Can reach confidently >25 cm (10")    From Standing Position, Pick up Object from Floor Able to pick up shoe safely and easily    From Standing Position, Turn to Look Behind Over each Shoulder Looks behind one side only/other side shows less weight shift    Turn 360 Degrees Able to turn 360 degrees safely but slowly    Standing Unsupported, Alternately Place Feet on Step/Stool Able to complete 4 steps without aid or supervision    Standing Unsupported, One Foot in Front Needs help to step but can hold 15 seconds    Standing on One Leg Tries to lift leg/unable to hold 3 seconds but remains standing independently    Total Score 41      Timed Up and Go Test   Normal TUG (seconds) 30    TUG Comments SPC                           [facility] Adult PT Treatment/Exercise - 10/03/20 0001       Knee/Hip Exercises: Aerobic   Nustep L 3 6 min LE only      Knee/Hip Exercises: Standing   Heel Raises Both;1 set;15 reps    Other Standing Knee Exercises weight shifts whtout holding on      Knee/Hip Exercises: Supine   Short Arc Quad Sets Left;3 sets;10 reps    Other Supine Knee/Hip Exercises feet on ball K2C, trunk rotation, small bridge                      PT Short Term Goals - 07/13/20 1753       PT SHORT TERM GOAL #1   Title independent with initial HEP    Status Achieved               PT Long Term Goals - 10/03/20 1519       PT LONG TERM GOAL #1   Title understand RICE    Time 12    Status Achieved      PT LONG TERM GOAL #2   Title dcrease pain 50%    Time 12    Status Partially Met  PT LONG TERM GOAL #3   Title increase left knee AROM to 0-120 degrees flexion    Status Partially Met      PT LONG TERM GOAL #4   Title walk on all surfaces without limp and  with minimal; pain    Status Partially Met      PT LONG TERM GOAL #5   Title decrease LBP 50%    Status Partially Met                   Plan - 10/03/20 1514     Clinical Impression Statement Patient has had another fall, she was at the beach last week and fell on her knee, the parents report that she has regressed with her walking, now using the cane and the walls, take small step to type steps.  She was walking without device prior to this.  She has had 3-4 falls over the past 3 months, she has had issues with balance and with her confidence showing regression with gait and more unsteady gait.  Her AROM of the knee regressed from the last visit with [place].  Her Tug time is overall improved but it was originally very slow and was 2 days after her surgery, she is still very slow with the step to pattern and using the cane, tends to use the walls and furniture.  She continues to have significant LBP, recent x-rays showed nothing different from past except the DDD    Rehab Potential Good    PT Frequency 1x / week    PT Duration 12 weeks    PT Treatment/Interventions ADLs/Self Care Home Management;Cryotherapy;Electrical Stimulation;Ultrasound;Gait training;Neuromuscular re-education;Balance training;Therapeutic exercise;Therapeutic activities;Stair training;Functional mobility training;Patient/family education;Manual techniques;Vasopneumatic Device    PT Next Visit Plan due to recent fall and the regression of walking and the motions we will ask to continue to see her, her mom has worries about the gait, her falls and balance and Jerika is reporting more LBP    Consulted and Agree with Plan of Care Patient             Patient will benefit from skilled therapeutic intervention in order to improve the following deficits and impairments:  Abnormal gait, Decreased range of motion, Difficulty walking, Decreased activity tolerance, Pain, Impaired flexibility, Decreased balance, Decreased  mobility, Decreased strength, Increased edema, Decreased scar mobility, Postural dysfunction, Increased muscle spasms  Visit Diagnosis: Difficulty in walking, not elsewhere classified - Plan: PT plan of care cert/re-cert  Localized edema - Plan: PT plan of care cert/re-cert  Acute pain of left knee - Plan: PT plan of care cert/re-cert  Acute bilateral low back pain without sciatica - Plan: PT plan of care cert/re-cert  Knee stiff, left - Plan: PT plan of care cert/re-cert  Repeated falls - Plan: PT plan of care cert/re-cert     Problem List Patient Active Problem List   Diagnosis Date Noted   S/P left unicompartmental knee replacement, lateral 06/29/2020   At risk for obstructive sleep apnea 06/11/2019   Cough 06/11/2019   Multifocal pneumonia    Acute respiratory failure with hypoxia ([place]) 05/26/2019   Hyperglycemia 05/26/2019   Acute respiratory failure with hypoxemia ([place]) 05/23/2019   Acute hypoxemic respiratory failure ([place]) 05/20/2019   Fibromyalgia 03/09/2019   Neck pain, chronic 03/09/2019   Acute shoulder bursitis, right 02/03/2019   Bile salt-induced diarrhea 09/25/2018   Arthritis of left acromioclavicular joint 09/23/2018   Precordial chest pain 08/11/2018   Morbid obesity ([place]) 10/31/2017  S/P right UKR, lateral 10/30/2017   Incarcerated epigastric hernia 05/30/2015   Left foot pain 08/09/2014   Chronic diarrhea 07/27/2014   Gout 08/10/2012   Acid reflux 01/22/2012   Down syndrome 11/11/2011   Hernia of abdominal wall 10/21/2011   Psoriasis 10/21/2011   Type 2 diabetes mellitus, uncontrolled ([place]) 04/12/2011   Hypothyroid 04/12/2011   Gait abnormality 06/22/2010   Tibial tendinitis, posterior 06/22/2010    Sumner Boast., PT 10/03/2020, 3:28 PM  [address] [place], [place], [address] Phone: [phone]   Fax:  [phone]  Name: Wanda Oneill MRN: [id number] Date of Birth:  1981/02/05

## 2020-10-05 ENCOUNTER — Ambulatory Visit: Payer: Medicare HMO | Admitting: Physical Therapy

## 2020-10-11 ENCOUNTER — Other Ambulatory Visit: Payer: Self-pay | Admitting: Family Medicine

## 2020-10-12 ENCOUNTER — Ambulatory Visit: Payer: Medicare HMO | Attending: Orthopedic Surgery | Admitting: Physical Therapy

## 2020-10-12 ENCOUNTER — Other Ambulatory Visit: Payer: Self-pay

## 2020-10-12 ENCOUNTER — Encounter: Payer: Self-pay | Admitting: Physical Therapy

## 2020-10-12 DIAGNOSIS — R262 Difficulty in walking, not elsewhere classified: Secondary | ICD-10-CM | POA: Diagnosis not present

## 2020-10-12 DIAGNOSIS — R296 Repeated falls: Secondary | ICD-10-CM | POA: Diagnosis not present

## 2020-10-12 DIAGNOSIS — M25562 Pain in left knee: Secondary | ICD-10-CM | POA: Diagnosis not present

## 2020-10-12 DIAGNOSIS — R600 Localized edema: Secondary | ICD-10-CM | POA: Diagnosis not present

## 2020-10-12 DIAGNOSIS — M25662 Stiffness of left knee, not elsewhere classified: Secondary | ICD-10-CM | POA: Diagnosis not present

## 2020-10-12 DIAGNOSIS — M545 Low back pain, unspecified: Secondary | ICD-10-CM | POA: Diagnosis not present

## 2020-10-12 NOTE — Therapy (Signed)
[place]. [place], [place], [address] Phone: [phone]   Fax:  [phone]  Physical Therapy Treatment  Patient Details  Name: Wanda Oneill MRN: [id number] Date of Birth: 1981-01-17 Referring Provider (PT): Copland   Encounter Date: 10/12/2020   PT End of Session - 10/12/20 1352     Visit Number 22    Number of Visits 33    Date for PT Re-Evaluation 12/24/20    Authorization Type [employer]    PT Start Time 6222    PT Stop Time 1410    PT Time Calculation (min) 65 min    Activity Tolerance Patient limited by pain    Behavior During Therapy [facility] for tasks assessed/performed             Past Medical History:  Diagnosis Date   Diabetes mellitus    type II   Down syndrome    Family history of adverse reaction to anesthesia    mom has had n/v   Gastritis    Headache    Hepatic steatosis    Hidradenitis    Hypothyroidism    Irritable bowel syndrome    Periumbilical hernia    Pneumonia 03/2020   frequent    Restless legs    Tendonitis    chronic in left foot   Thyroid disease    hypothyroidism    Past Surgical History:  Procedure Laterality Date   AXILLARY HIDRADENITIS EXCISION Bilateral    CHOLECYSTECTOMY     HERNIA REPAIR     multiple   INCISIONAL HERNIA REPAIR  05/30/2015   LAPROSCOPIC   INCISIONAL HERNIA REPAIR N/A 05/30/2015   Procedure: LAPAROSCOPIC INCISIONAL HERNIA;  Surgeon: Rolm Bookbinder, MD;  Location: [place];  Service: General;  Laterality: N/A;   INSERTION OF MESH N/A 05/30/2015   Procedure: INSERTION OF MESH;  Surgeon: Rolm Bookbinder, MD;  Location: [place];  Service: General;  Laterality: N/A;   KNEE ARTHROSCOPY     right knee   LAPAROSCOPY N/A 05/30/2015   Procedure: LAPAROSCOPY DIAGNOSTIC;  Surgeon: Rolm Bookbinder, MD;  Location: [place];  Service: General;  Laterality: N/A;   PARTIAL KNEE ARTHROPLASTY Right 10/30/2017   Procedure: UNICOMPARTMENTAL RIGHT KNEE LATERAL;   Surgeon: Paralee Cancel, MD;  [facility];  Service: Orthopedics;  Laterality: Right;  90 mins   PARTIAL KNEE ARTHROPLASTY Left 06/29/2020   Procedure: UNICOMPARTMENTAL KNEE LATERALLY;  Surgeon: Paralee Cancel, MD;  [facility];  Service: Orthopedics;  Laterality: Left;  70 MINS   TONSILLECTOMY     adenoids    There were no vitals filed for this visit.   Subjective Assessment - 10/12/20 1313     Subjective I have been doing okay, she comes in using the [facility]    Currently in Pain? Yes    Pain Score 5     Pain Location Knee    Pain Orientation Left    Pain Descriptors / Indicators Sore    Aggravating Factors  walking                               [facility] Adult PT Treatment/Exercise - 10/12/20 0001       Ambulation/Gait   Gait Comments outside with [facility] and CGA/SBA negotiating curbs, uneven surfaces, some cues for speed and step length, stairs step over step      High Level Balance   High Level Balance Activities  Negotitating around obstacles;Negotiating over obstacles;Backward walking;Side stepping      Knee/Hip Exercises: Aerobic   Recumbent Bike 6 min L 1    Nustep L 3 6 min LE only      Knee/Hip Exercises: Machines for Strengthening   Cybex Knee Extension 5# 2x10    Cybex Knee Flexion 20# 2x10      Moist Heat Therapy   Number Minutes Moist Heat 10 Minutes      Electrical Stimulation   Electrical Stimulation Location right lumbar area    Electrical Stimulation Action IFC    Electrical Stimulation Parameters supine    Electrical Stimulation Goals Pain      Vasopneumatic   Number Minutes Vasopneumatic  10 minutes    Vasopnuematic Location  Knee    Vasopneumatic Pressure Medium    Vasopneumatic Temperature  37                       PT Short Term Goals - 07/13/20 1753       PT SHORT TERM GOAL #1   Title independent with initial HEP    Status Achieved               PT Long Term Goals - 10/12/20 1354       PT LONG  TERM GOAL #1   Title understand RICE    Status Achieved      PT LONG TERM GOAL #2   Title dcrease pain 50%    Status Partially Met      PT LONG TERM GOAL #3   Title increase left knee AROM to 0-120 degrees flexion    Status Partially Met                   Plan - 10/12/20 1353     Clinical Impression Statement Patient still mentally has fear of falling, tends to go very slow and use walls for stability, I had her walk outside today and she could not hold on but made me stay beside her, she did well once she got going with cues for speed and step length.  Stairs going up are step over step but going down is one at a time    PT Next Visit Plan work on gait and fear of falling    Consulted and Agree with Plan of Care Patient             Patient will benefit from skilled therapeutic intervention in order to improve the following deficits and impairments:  Abnormal gait, Decreased range of motion, Difficulty walking, Decreased activity tolerance, Pain, Impaired flexibility, Decreased balance, Decreased mobility, Decreased strength, Increased edema, Decreased scar mobility, Postural dysfunction, Increased muscle spasms  Visit Diagnosis: Difficulty in walking, not elsewhere classified  Localized edema  Acute pain of left knee  Acute bilateral low back pain without sciatica  Knee stiff, left  Repeated falls     Problem List Patient Active Problem List   Diagnosis Date Noted   S/P left unicompartmental knee replacement, lateral 06/29/2020   At risk for obstructive sleep apnea 06/11/2019   Cough 06/11/2019   Multifocal pneumonia    Acute respiratory failure with hypoxia ([place]) 05/26/2019   Hyperglycemia 05/26/2019   Acute respiratory failure with hypoxemia ([place]) 05/23/2019   Acute hypoxemic respiratory failure ([place]) 05/20/2019   Fibromyalgia 03/09/2019   Neck pain, chronic 03/09/2019   Acute shoulder bursitis, right 02/03/2019   Bile salt-induced diarrhea  09/25/2018   Arthritis of left  acromioclavicular joint 09/23/2018   Precordial chest pain 08/11/2018   Morbid obesity ([place]) 10/31/2017   S/P right UKR, lateral 10/30/2017   Incarcerated epigastric hernia 05/30/2015   Left foot pain 08/09/2014   Chronic diarrhea 07/27/2014   Gout 08/10/2012   Acid reflux 01/22/2012   Down syndrome 11/11/2011   Hernia of abdominal wall 10/21/2011   Psoriasis 10/21/2011   Type 2 diabetes mellitus, uncontrolled ([place]) 04/12/2011   Hypothyroid 04/12/2011   Gait abnormality 06/22/2010   Tibial tendinitis, posterior 06/22/2010    Sumner Boast, PT 10/12/2020, 1:55 PM  [address]. [place], [place], [address] Phone: [phone]   Fax:  [phone]  Name: Wanda Oneill MRN: [id number] Date of Birth: 14-Feb-1980

## 2020-10-15 ENCOUNTER — Other Ambulatory Visit: Payer: Self-pay | Admitting: Family Medicine

## 2020-10-16 NOTE — Telephone Encounter (Signed)
90 day refill sent 09-04-20

## 2020-10-24 ENCOUNTER — Other Ambulatory Visit: Payer: Self-pay

## 2020-10-24 ENCOUNTER — Encounter: Payer: Self-pay | Admitting: Physical Therapy

## 2020-10-24 ENCOUNTER — Ambulatory Visit: Payer: Medicare HMO | Admitting: Physical Therapy

## 2020-10-24 DIAGNOSIS — R296 Repeated falls: Secondary | ICD-10-CM | POA: Diagnosis not present

## 2020-10-24 DIAGNOSIS — M545 Low back pain, unspecified: Secondary | ICD-10-CM | POA: Diagnosis not present

## 2020-10-24 DIAGNOSIS — M25662 Stiffness of left knee, not elsewhere classified: Secondary | ICD-10-CM

## 2020-10-24 DIAGNOSIS — R262 Difficulty in walking, not elsewhere classified: Secondary | ICD-10-CM

## 2020-10-24 DIAGNOSIS — M25562 Pain in left knee: Secondary | ICD-10-CM | POA: Diagnosis not present

## 2020-10-24 DIAGNOSIS — R600 Localized edema: Secondary | ICD-10-CM | POA: Diagnosis not present

## 2020-10-24 NOTE — Therapy (Signed)
[place]. [place], [place], [address] Phone: [phone]   Fax:  [phone]  Physical Therapy Treatment  Patient Details  Name: Wanda Oneill MRN: [id number] Date of Birth: 1980/08/20 Referring Provider (PT): Copland   Encounter Date: 10/24/2020   PT End of Session - 10/24/20 1514     Visit Number 23    Date for PT Re-Evaluation 12/24/20    PT Start Time 1443    PT Stop Time 7412    PT Time Calculation (min) 47 min    Activity Tolerance Patient tolerated treatment well    Behavior During Therapy [facility] for tasks assessed/performed             Past Medical History:  Diagnosis Date   Diabetes mellitus    type II   Down syndrome    Family history of adverse reaction to anesthesia    mom has had n/v   Gastritis    Headache    Hepatic steatosis    Hidradenitis    Hypothyroidism    Irritable bowel syndrome    Periumbilical hernia    Pneumonia 03/2020   frequent    Restless legs    Tendonitis    chronic in left foot   Thyroid disease    hypothyroidism    Past Surgical History:  Procedure Laterality Date   AXILLARY HIDRADENITIS EXCISION Bilateral    CHOLECYSTECTOMY     HERNIA REPAIR     multiple   INCISIONAL HERNIA REPAIR  05/30/2015   LAPROSCOPIC   INCISIONAL HERNIA REPAIR N/A 05/30/2015   Procedure: LAPAROSCOPIC INCISIONAL HERNIA;  Surgeon: Rolm Bookbinder, MD;  Location: [place];  Service: General;  Laterality: N/A;   INSERTION OF MESH N/A 05/30/2015   Procedure: INSERTION OF MESH;  Surgeon: Rolm Bookbinder, MD;  Location: [place];  Service: General;  Laterality: N/A;   KNEE ARTHROSCOPY     right knee   LAPAROSCOPY N/A 05/30/2015   Procedure: LAPAROSCOPY DIAGNOSTIC;  Surgeon: Rolm Bookbinder, MD;  Location: [place];  Service: General;  Laterality: N/A;   PARTIAL KNEE ARTHROPLASTY Right 10/30/2017   Procedure: UNICOMPARTMENTAL RIGHT KNEE LATERAL;  Surgeon: Paralee Cancel, MD;  [facility];  Service: Orthopedics;  Laterality: Right;  90 mins   PARTIAL KNEE ARTHROPLASTY Left 06/29/2020   Procedure: UNICOMPARTMENTAL KNEE LATERALLY;  Surgeon: Paralee Cancel, MD;  [facility];  Service: Orthopedics;  Laterality: Left;  70 MINS   TONSILLECTOMY     adenoids    There were no vitals filed for this visit.   Subjective Assessment - 10/24/20 1446     Subjective I am sore in the knee, reports that she has not had any falls or stumbles, reports that she went to [employer] and had to walk due to no electric carts    Currently in Pain? Yes    Pain Score 5     Pain Location Knee    Pain Orientation Left    Pain Descriptors / Indicators Sore;Aching                               [facility] Adult PT Treatment/Exercise - 10/24/20 0001       Ambulation/Gait   Gait Comments in the building, SPC 150' x 3 with her doing stairs up and down      High Level Balance   High Level Balance Activities Negotitating around obstacles;Negotiating over obstacles;Backward walking;Side  stepping    High Level Balance Comments did this with and without the SPC, cone touches using SPC      Knee/Hip Exercises: Stretches   Gastroc Stretch Both;3 reps;20 seconds      Knee/Hip Exercises: Aerobic   Recumbent Bike 6 min L 1      Knee/Hip Exercises: Machines for Strengthening   Cybex Knee Extension 5# 2x10    Cybex Knee Flexion 20# 2x10      Knee/Hip Exercises: Standing   Hip Abduction Both;2 sets;10 reps                       PT Short Term Goals - 07/13/20 1753       PT SHORT TERM GOAL #1   Title independent with initial HEP    Status Achieved               PT Long Term Goals - 10/24/20 1518       PT LONG TERM GOAL #2   Title dcrease pain 50%    Status Partially Met      PT LONG TERM GOAL #3   Title increase left knee AROM to 0-120 degrees flexion    Status Partially Met      PT LONG TERM GOAL #4   Title walk on all surfaces without limp and with  minimal; pain    Status Partially Met                   Plan - 10/24/20 1515     Clinical Impression Statement Patient looks to be gaining confidence, she is moving faster and using the walls less. She reports that she feels better except outside and places that she is unfamiliar with.  She still struggles on steps and curbs with a lot of hesitation.  I sat up an obstacle course and she did well with this with CGA and the [facility] and then no cane and still wanting CGA.  She is still rating her pain high    PT Next Visit Plan continue to work on her function and decrease her fear and hesitation    Consulted and Agree with Plan of Care Patient             Patient will benefit from skilled therapeutic intervention in order to improve the following deficits and impairments:  Abnormal gait, Decreased range of motion, Difficulty walking, Decreased activity tolerance, Pain, Impaired flexibility, Decreased balance, Decreased mobility, Decreased strength, Increased edema, Decreased scar mobility, Postural dysfunction, Increased muscle spasms  Visit Diagnosis: Difficulty in walking, not elsewhere classified  Localized edema  Acute pain of left knee  Acute bilateral low back pain without sciatica  Knee stiff, left  Repeated falls     Problem List Patient Active Problem List   Diagnosis Date Noted   S/P left unicompartmental knee replacement, lateral 06/29/2020   At risk for obstructive sleep apnea 06/11/2019   Cough 06/11/2019   Multifocal pneumonia    Acute respiratory failure with hypoxia ([place]) 05/26/2019   Hyperglycemia 05/26/2019   Acute respiratory failure with hypoxemia ([place]) 05/23/2019   Acute hypoxemic respiratory failure ([place]) 05/20/2019   Fibromyalgia 03/09/2019   Neck pain, chronic 03/09/2019   Acute shoulder bursitis, right 02/03/2019   Bile salt-induced diarrhea 09/25/2018   Arthritis of left acromioclavicular joint 09/23/2018   Precordial chest pain  08/11/2018   Morbid obesity ([place]) 10/31/2017   S/P right UKR, lateral 10/30/2017   Incarcerated epigastric hernia 05/30/2015  Left foot pain 08/09/2014   Chronic diarrhea 07/27/2014   Gout 08/10/2012   Acid reflux 01/22/2012   Down syndrome 11/11/2011   Hernia of abdominal wall 10/21/2011   Psoriasis 10/21/2011   Type 2 diabetes mellitus, uncontrolled ([place]) 04/12/2011   Hypothyroid 04/12/2011   Gait abnormality 06/22/2010   Tibial tendinitis, posterior 06/22/2010    Sumner Boast, PT 10/24/2020, 3:18 PM  [place]. [place], [place], [address] Phone: [phone]   Fax:  [phone]  Name: VIVIANA TRIMBLE MRN: [id number] Date of Birth: [birth year]

## 2020-10-25 ENCOUNTER — Ambulatory Visit (INDEPENDENT_AMBULATORY_CARE_PROVIDER_SITE_OTHER): Payer: Medicare HMO

## 2020-10-25 VITALS — Ht 64.0 in | Wt 230.0 lb

## 2020-10-25 DIAGNOSIS — Z0000 Encounter for general adult medical examination without abnormal findings: Secondary | ICD-10-CM | POA: Diagnosis not present

## 2020-10-25 NOTE — Progress Notes (Signed)
Subjective:   Wanda Oneill is a 40 y.o. female who presents for an Initial Medicare Annual Wellness Visit.   I connected with Tamberlyn's mother today by telephone and verified that I am speaking with the correct person using two identifiers. Location patient: home Location provider: work Persons participating in the virtual visit: patient's mother, Marine scientist.    I discussed the limitations, risks, security and privacy concerns of performing an evaluation and management service by telephone and the availability of in person appointments. I also discussed with the patient that there may be a patient responsible charge related to this service. The patient expressed understanding and verbally consented to this telephonic visit.    Interactive audio and video telecommunications were attempted between this provider and patient, however failed, due to patient having technical difficulties OR patient did not have access to video capability.  We continued and completed visit with audio only.  Some vital signs may be absent or patient reported.   Time Spent with patient on telephone encounter: 30 minutes  Patient's mother answered all questions today.  Review of Systems     Cardiac Risk Factors include: obesity (BMI >30kg/m2)     Objective:    Today's Vitals   10/25/20 1141  Weight: 230 lb (104.3 kg)  Height: _0  (1.626 m)   Body mass index is 39.48 kg/m.  Advanced Directives 10/25/2020 06/29/2020 06/23/2020 02/01/2020 05/27/2019 05/23/2019 05/23/2019  Does Patient Have a Medical Advance Directive? No No No Yes No No No  Type of Advance Directive - - - [place]  Does patient want to make changes to medical advance directive? Yes (MAU/Ambulatory/Procedural Areas - Information given) - - - - - -  Would patient like information on creating a medical advance directive? - No - Patient declined No - Patient declined - No - Patient declined No - Patient declined -     Current Medications (verified) Outpatient Encounter Medications as of 10/25/2020  Medication Sig   acetaminophen (TYLENOL) 325 MG tablet Take 650 mg by mouth every 6 (six) hours as needed for mild pain.    allopurinol (ZYLOPRIM) 300 MG tablet Take 1 tablet by mouth once daily   Blood Glucose Monitoring Suppl (BLOOD GLUCOSE METER KIT AND SUPPLIES) Dispense based on patient and insurance preference. To check blood sugars daily FOR ICD-9 250.00 (Patient taking differently: 1 each by Other route See admin instructions. Dispense based on patient and insurance preference. To check blood sugars daily FOR ICD-9 250.00)   Blood Glucose Monitoring Suppl (TRUE METRIX METER) w/Device KIT Use as directed to check glucose up to two times daily. Dx Code E11.9   cholestyramine (QUESTRAN) 4 g packet DISSOLVE & TAKE 1 PACKET OF POWDER BY MOUTH TWICE DAILY (Patient taking differently: Take 4 g by mouth 2 (two) times daily.)   dicyclomine (BENTYL) 20 MG tablet Take 20 mg by mouth daily as needed for spasms.   empagliflozin (JARDIANCE) 25 MG TABS tablet Take 25 mg by mouth daily.   Glucose Blood (BLOOD GLUCOSE TEST STRIPS) STRP 1 each by Other route See admin instructions. Use to test blood sugar up to twice a day   glucose blood (FREESTYLE TEST STRIPS) test strip Use as directed to test blood sugar up to twice a day. Dx code E11.9   glucose blood (TRUE METRIX BLOOD GLUCOSE TEST) test strip Use as instructed to check glucose up to 2 times daily. Dx Code E11.9   levonorgestrel-ethinyl estradiol (JOLESSA) 0.15-0.03  MG tablet Take 1 tablet by mouth once daily   levothyroxine (SYNTHROID) 137 MCG tablet TAKE 1 TABLET BY MOUTH ONCE DAILY BEFORE BREAKFAST   metFORMIN (GLUCOPHAGE) 1000 MG tablet Take 1,000 mg by mouth 2 (two) times daily.    ONE TOUCH ULTRA TEST test strip USE TO CHECK BLOOD SUGAR ONCE DAILY (ICD CODE:25.00) (Patient taking differently: 1 each by Other route daily.)   TRUEplus Lancets 30G MISC Use as  directed to check glucose up to two times daily. Dx code Q65.7   TRULICITY 1.5 QI/6.9GE SOPN Inject 1.5 mg into the skin every Saturday.   VASCEPA 1 g capsule Take 2 g by mouth 2 (two) times daily.   doxycycline (VIBRAMYCIN) 100 MG capsule Take 1 capsule (100 mg total) by mouth 2 (two) times daily. (Patient not taking: Reported on 10/25/2020)   HYDROcodone-acetaminophen (NORCO/VICODIN) 5-325 MG tablet Take 1-2 tablets by mouth every 6 (six) hours as needed for severe pain. (Patient not taking: Reported on 10/25/2020)   methocarbamol (ROBAXIN) 500 MG tablet Take 1 tablet (500 mg total) by mouth every 6 (six) hours as needed for muscle spasms. (Patient not taking: Reported on 10/25/2020)   No facility-administered encounter medications on file as of 10/25/2020.    Allergies (verified) Vancomycin, Cefuroxime axetil, and Sulfa antibiotics   History: Past Medical History:  Diagnosis Date   Diabetes mellitus    type II   Down syndrome    Family history of adverse reaction to anesthesia    mom has had n/v   Gastritis    Headache    Hepatic steatosis    Hidradenitis    Hypothyroidism    Irritable bowel syndrome    Periumbilical hernia    Pneumonia 03/2020   frequent    Restless legs    Tendonitis    chronic in left foot   Thyroid disease    hypothyroidism   Past Surgical History:  Procedure Laterality Date   AXILLARY HIDRADENITIS EXCISION Bilateral    CHOLECYSTECTOMY     HERNIA REPAIR     multiple   INCISIONAL HERNIA REPAIR  05/30/2015   LAPROSCOPIC   INCISIONAL HERNIA REPAIR N/A 05/30/2015   Procedure: LAPAROSCOPIC INCISIONAL HERNIA;  Surgeon: Rolm Bookbinder, MD;  Location: [facility];  Service: General;  Laterality: N/A;   INSERTION OF MESH N/A 05/30/2015   Procedure: INSERTION OF MESH;  Surgeon: Rolm Bookbinder, MD;  Location: [place];  Service: General;  Laterality: N/A;   KNEE ARTHROSCOPY     right knee   LAPAROSCOPY N/A 05/30/2015   Procedure: LAPAROSCOPY DIAGNOSTIC;  Surgeon:  Rolm Bookbinder, MD;  Location: [place];  Service: General;  Laterality: N/A;   PARTIAL KNEE ARTHROPLASTY Right 10/30/2017   Procedure: UNICOMPARTMENTAL RIGHT KNEE LATERAL;  Surgeon: Paralee Cancel, MD;  Location: [facility];  Service: Orthopedics;  Laterality: Right;  90 mins   PARTIAL KNEE ARTHROPLASTY Left 06/29/2020   Procedure: UNICOMPARTMENTAL KNEE LATERALLY;  Surgeon: Paralee Cancel, MD;  Location: [facility];  Service: Orthopedics;  Laterality: Left;  47 MINS   TONSILLECTOMY     adenoids   Family History  Problem Relation Age of Onset   Thyroid cancer Mother    Diabetes type II Mother    High Cholesterol Mother    Heart disease Mother    Diabetes type II Father    Hypertension Father    Stroke Father    Social History   Socioeconomic History   Marital status: Single    Spouse name: Not on file  Number of children: Not on file   Years of education: Not on file   Highest education level: Not on file  Occupational History   Occupation: disabled  Tobacco Use   Smoking status: Never   Smokeless tobacco: Never  Vaping Use   Vaping Use: Never used  Substance and Sexual Activity   Alcohol use: No    Alcohol/week: 0.0 standard drinks   Drug use: No   Sexual activity: Yes    Birth control/protection: Pill  Other Topics Concern   Not on file  Social History Narrative   Not on file   Social Determinants of Health   Financial Resource Strain: Low Risk    Difficulty of Paying Living Expenses: Not hard at all  Food Insecurity: No Food Insecurity   Worried About Charity fundraiser in the Last Year: Never true   [place] in the Last Year: Never true  Transportation Needs: No Transportation Needs   Lack of Transportation (Medical): No   Lack of Transportation (Non-Medical): No  Physical Activity: Inactive   Days of Exercise per Week: 0 days   Minutes of Exercise per Session: 0 min  Stress: Not on file  Social Connections: Moderately Integrated   Frequency of  Communication with Friends and Family: More than three times a week   Frequency of Social Gatherings with Friends and Family: More than three times a week   Attends Religious Services: More than 4 times per year   Active Member of [employer] or Organizations: Yes   Attends Music therapist: More than 4 times per year   Marital Status: Never married    Tobacco Counseling Counseling given: Not Answered   Clinical Intake:  Pre-visit preparation completed: Yes        BMI - recorded: 39.48 Nutritional Status: BMI > 30  Obese Nutritional Risks: None Diabetes: Yes CBG done?: No Did pt. bring in CBG monitor from home?: No  How often do you need to have someone help you when you read instructions, pamphlets, or other written materials from your doctor or pharmacy?: 1 - Never Diabetes:  Is the patient diabetic?  Yes  If diabetic, was a CBG obtained today?  No  Did the patient bring in their glucometer from home?  No phone visit How often do you monitor your CBG's? Several times per week.   Financial Strains and Diabetes Management:  Are you having any financial strains with the device, your supplies or your medication? No .  Does the patient want to be seen by Chronic Care Management for management of their diabetes?  No  Would the patient like to be referred to a Nutritionist or for Diabetic Management?  No   Diabetic Exams:  Diabetic Eye Exam: . Overdue for diabetic eye exam. Pt 's mother has been advised about the importance in completing this exam.Patient's mother plans to make an appt soon  Diabetic Foot Exam: To be completed by PCP   Interpreter Needed?: No  Information entered by :: Caroleen Hamman LPN   Activities of Daily Living In your present state of health, do you have any difficulty performing the following activities: 10/25/2020 06/29/2020  Hearing? N N  Vision? N N  Difficulty concentrating or making decisions? N N  Walking or climbing stairs? N N   Dressing or bathing? N N  Doing errands, shopping? N N  Preparing Food and eating ? N -  Using the Toilet? N -  In the past six  months, have you accidently leaked urine? N -  Do you have problems with loss of bowel control? N -  Managing your Medications? N -  Managing your Finances? N -  Housekeeping or managing your Housekeeping? N -  Some recent data might be hidden    Patient Care Team: Copland, Gay Filler, MD as PCP - General (Family Medicine) Silverio Decamp, MD as Consulting Physician (Sports Medicine)  Indicate any recent Medical Services you may have received from other than [facility] providers in the past year (date may be approximate).     Assessment:   This is a routine wellness examination for [place].  Hearing/Vision screen Hearing Screening - Comments:: No issues Vision Screening - Comments:: Last eye exam-2021-Dr. [place]  Dietary issues and exercise activities discussed: Current Exercise Habits: The patient does not participate in regular exercise at present, Exercise limited by: orthopedic condition(s) (knee pain)   Goals Addressed   None    Depression Screen [facility] 2/9 Scores 10/25/2020 09/01/2019 02/27/2017 02/06/2015  PHQ - 2 Score - 0 0 0  Exception Documentation Other- indicate reason in comment box - - -    Fall Risk Fall Risk  10/25/2020 02/27/2017 02/06/2015  Falls in the past year? 1 Yes Yes  Number falls in past yr: _0 or more  Injury with Fall? 0 Yes No  Risk for fall due to : History of fall(s) - -  Follow up Falls prevention discussed Falls evaluation completed -    FALL RISK PREVENTION PERTAINING TO THE HOME:  Any stairs in or around the home? No  Home free of loose throw rugs in walkways, pet beds, electrical cords, etc? Yes  Adequate lighting in your home to reduce risk of falls? Yes   ASSISTIVE DEVICES UTILIZED TO PREVENT FALLS:  Life alert? No  Use of a cane, walker or w/c? Yes  Grab bars in the bathroom? Yes  Shower chair or bench in  shower? Yes  Elevated toilet seat or a handicapped toilet? No   TIMED UP AND GO:  Was the test performed? No . Phone visit   Cognitive Function:Unable to assess cognitive function. Patient's mother completed visit for her today.        Immunizations Immunization History  Administered Date(s) Administered   Influenza Inj Mdck Quad Pf 11/29/2016, 10/29/2017   Influenza Split 11/05/2011   Influenza, Seasonal, Injecte, Preservative Fre 11/16/2012, 10/25/2013, 12/07/2014, 01/06/2015, 11/15/2015   Influenza,inj,Quad PF,6+ Mos 11/15/2015, 09/26/2018   Influenza-Unspecified 11/05/2011, 12/07/2014, 11/05/2019   Moderna Sars-Covid-2 Vaccination 04/01/2019, 05/04/2019, 10/06/2019   Tdap 03/05/2017    TDAP status: Up to date  Flu Vaccine status: Due, Education has been provided regarding the importance of this vaccine. Advised may receive this vaccine at local pharmacy or Health Dept. Aware to provide a copy of the vaccination record if obtained from local pharmacy or Health Dept. Verbalized acceptance and understanding.  Pneumococcal vaccine status: Not yet indicated  Covid-19 vaccine status: Information provided on how to obtain vaccines. Booster due  Qualifies for Shingles Vaccine? No-Not yet indicated   Screening Tests Health Maintenance  Topic Date Due   URINE MICROALBUMIN  Never done   Hepatitis C Screening  Never done   PAP SMEAR-Modifier  Never done   OPHTHALMOLOGY EXAM  06/05/2019   FOOT EXAM  08/10/2019   INFLUENZA VACCINE  09/04/2020   HEMOGLOBIN A1C  12/24/2020   TETANUS/TDAP  03/06/2027   COVID-19 Vaccine  Completed   HIV Screening  Completed   HPV  VACCINES  Aged Out    Health Maintenance  Health Maintenance Due  Topic Date Due   URINE MICROALBUMIN  Never done   Hepatitis C Screening  Never done   PAP SMEAR-Modifier  Never done   OPHTHALMOLOGY EXAM  06/05/2019   FOOT EXAM  08/10/2019   INFLUENZA VACCINE  09/04/2020    Colorectal cancer screening: Not  yet indicated  Mammogram status: Declined today  Bone Density status: Not yet indicated  Lung Cancer Screening: (Low Dose CT Chest recommended if Age 90-80 years, 30 pack-year currently smoking OR have quit w/in 15years.) does not qualify.     Additional Screening:  Hepatitis C Screening: does not qualify  Vision Screening: Recommended annual ophthalmology exams for early detection of glaucoma and other disorders of the eye. Is the patient up to date with their annual eye exam?  No  Who is the provider or what is the name of the office in which the patient attends annual eye exams? Dr. [place]   Dental Screening: Recommended annual dental exams for proper oral hygiene  Community Resource Referral / Chronic Care Management: CRR required this visit?  No   CCM required this visit?  No      Plan:     I have personally reviewed and noted the following in the patient's chart:   Medical and social history Use of alcohol, tobacco or illicit drugs  Current medications and supplements including opioid prescriptions. Patient is not currently taking opioid prescriptions. Functional ability and status Nutritional status Physical activity Advanced directives List of other physicians Hospitalizations, surgeries, and ER visits in previous 12 months Vitals Screenings to include cognitive, depression, and falls Referrals and appointments  In addition, I have reviewed and discussed with patient certain preventive protocols, quality metrics, and best practice recommendations. A written personalized care plan for preventive services as well as general preventive health recommendations were provided to patient.   Due to this being a telephonic visit, the after visit summary with patients personalized plan was offered to patient via mail or my-chart. Per request of mother, avs mailed to patient.   Marta Antu, LPN   [id number]  Nurse Health Advisor  Nurse Notes: None

## 2020-10-25 NOTE — Patient Instructions (Signed)
Wanda Oneill , Thank you for taking time to complete your Medicare Wellness Visit. I appreciate your ongoing commitment to your health goals. Please review the following plan we discussed and let me know if I can assist you in the future.   Screening recommendations/referrals: Colonoscopy: Not yet indicated Mammogram: Please call the office when you are ready to schedule. Bone Density: Not yet indicated Recommended yearly ophthalmology/optometry visit for glaucoma screening and checkup Recommended yearly dental visit for hygiene and checkup  Vaccinations: Influenza vaccine: Due Pneumococcal vaccine: Not yet indicated Tdap vaccine: Up to date Shingles vaccine: Not yet indicated  Covid-19: Booster available at the pharmacy.  Advanced directives: Information mailed today.  Conditions/risks identified: See problem list  Next appointment: Follow up in one year for your annual wellness visit.   Preventive Care 40-64 Years, Female Preventive care refers to lifestyle choices and visits with your health care provider that can promote health and wellness. What does preventive care include? A yearly physical exam. This is also called an annual well check. Dental exams once or twice a year. Routine eye exams. Ask your health care provider how often you should have your eyes checked. Personal lifestyle choices, including: Daily care of your teeth and gums. Regular physical activity. Eating a healthy diet. Avoiding tobacco and drug use. Limiting alcohol use. Practicing safe sex. Taking low-dose aspirin daily starting at age 51. Taking vitamin and mineral supplements as recommended by your health care provider. What happens during an annual well check? The services and screenings done by your health care provider during your annual well check will depend on your age, overall health, lifestyle risk factors, and family history of disease. Counseling  Your health care provider may ask you  questions about your: Alcohol use. Tobacco use. Drug use. Emotional well-being. Home and relationship well-being. Sexual activity. Eating habits. Work and work Statistician. Method of birth control. Menstrual cycle. Pregnancy history. Screening  You may have the following tests or measurements: Height, weight, and BMI. Blood pressure. Lipid and cholesterol levels. These may be checked every 5 years, or more frequently if you are over 47 years old. Skin check. Lung cancer screening. You may have this screening every year starting at age 28 if you have a 30-pack-year history of smoking and currently smoke or have quit within the past 15 years. Fecal occult blood test (FOBT) of the stool. You may have this test every year starting at age 66. Flexible sigmoidoscopy or colonoscopy. You may have a sigmoidoscopy every 5 years or a colonoscopy every 10 years starting at age 64. Hepatitis C blood test. Hepatitis B blood test. Sexually transmitted disease (STD) testing. Diabetes screening. This is done by checking your blood sugar (glucose) after you have not eaten for a while (fasting). You may have this done every 1-3 years. Mammogram. This may be done every 1-2 years. Talk to your health care provider about when you should start having regular mammograms. This may depend on whether you have a family history of breast cancer. BRCA-related cancer screening. This may be done if you have a family history of breast, ovarian, tubal, or peritoneal cancers. Pelvic exam and Pap test. This may be done every 3 years starting at age 13. Starting at age 80, this may be done every 5 years if you have a Pap test in combination with an HPV test. Bone density scan. This is done to screen for osteoporosis. You may have this scan if you are at high risk for osteoporosis. Discuss  your test results, treatment options, and if necessary, the need for more tests with your health care provider. Vaccines  Your health  care provider may recommend certain vaccines, such as: Influenza vaccine. This is recommended every year. Tetanus, diphtheria, and acellular pertussis (Tdap, Td) vaccine. You may need a Td booster every 10 years. Zoster vaccine. You may need this after age 91. Pneumococcal 13-valent conjugate (PCV13) vaccine. You may need this if you have certain conditions and were not previously vaccinated. Pneumococcal polysaccharide (PPSV23) vaccine. You may need one or two doses if you smoke cigarettes or if you have certain conditions. Talk to your health care provider about which screenings and vaccines you need and how often you need them. This information is not intended to replace advice given to you by your health care provider. Make sure you discuss any questions you have with your health care provider. Document Released: 02/17/2015 Document Revised: 10/11/2015 Document Reviewed: 11/22/2014 Elsevier Interactive Patient Education  2017 [place] Prevention in the Home Falls can cause injuries. They can happen to people of all ages. There are many things you can do to make your home safe and to help prevent falls. What can I do on the outside of my home? Regularly fix the edges of walkways and driveways and fix any cracks. Remove anything that might make you trip as you walk through a door, such as a raised step or threshold. Trim any bushes or trees on the path to your home. Use bright outdoor lighting. Clear any walking paths of anything that might make someone trip, such as rocks or tools. Regularly check to see if handrails are loose or broken. Make sure that both sides of any steps have handrails. Any raised decks and porches should have guardrails on the edges. Have any leaves, snow, or ice cleared regularly. Use sand or salt on walking paths during winter. Clean up any spills in your garage right away. This includes oil or grease spills. What can I do in the bathroom? Use  night lights. Install grab bars by the toilet and in the tub and shower. Do not use towel bars as grab bars. Use non-skid mats or decals in the tub or shower. If you need to sit down in the shower, use a plastic, non-slip stool. Keep the floor dry. Clean up any water that spills on the floor as soon as it happens. Remove soap buildup in the tub or shower regularly. Attach bath mats securely with double-sided non-slip rug tape. Do not have throw rugs and other things on the floor that can make you trip. What can I do in the bedroom? Use night lights. Make sure that you have a light by your bed that is easy to reach. Do not use any sheets or blankets that are too big for your bed. They should not hang down onto the floor. Have a firm chair that has side arms. You can use this for support while you get dressed. Do not have throw rugs and other things on the floor that can make you trip. What can I do in the kitchen? Clean up any spills right away. Avoid walking on wet floors. Keep items that you use a lot in easy-to-reach places. If you need to reach something above you, use a strong step stool that has a grab bar. Keep electrical cords out of the way. Do not use floor polish or wax that makes floors slippery. If you must use wax, use  non-skid floor wax. Do not have throw rugs and other things on the floor that can make you trip. What can I do with my stairs? Do not leave any items on the stairs. Make sure that there are handrails on both sides of the stairs and use them. Fix handrails that are broken or loose. Make sure that handrails are as long as the stairways. Check any carpeting to make sure that it is firmly attached to the stairs. Fix any carpet that is loose or worn. Avoid having throw rugs at the top or bottom of the stairs. If you do have throw rugs, attach them to the floor with carpet tape. Make sure that you have a light switch at the top of the stairs and the bottom of the  stairs. If you do not have them, ask someone to add them for you. What else can I do to help prevent falls? Wear shoes that: Do not have high heels. Have rubber bottoms. Are comfortable and fit you well. Are closed at the toe. Do not wear sandals. If you use a stepladder: Make sure that it is fully opened. Do not climb a closed stepladder. Make sure that both sides of the stepladder are locked into place. Ask someone to hold it for you, if possible. Clearly mark and make sure that you can see: Any grab bars or handrails. First and last steps. Where the edge of each step is. Use tools that help you move around (mobility aids) if they are needed. These include: Canes. Walkers. Scooters. Crutches. Turn on the lights when you go into a dark area. Replace any light bulbs as soon as they burn out. Set up your furniture so you have a clear path. Avoid moving your furniture around. If any of your floors are uneven, fix them. If there are any pets around you, be aware of where they are. Review your medicines with your doctor. Some medicines can make you feel dizzy. This can increase your chance of falling. Ask your doctor what other things that you can do to help prevent falls. This information is not intended to replace advice given to you by your health care provider. Make sure you discuss any questions you have with your health care provider. Document Released: 11/17/2008 Document Revised: 06/29/2015 Document Reviewed: 02/25/2014 Elsevier Interactive Patient Education  2017 [employer].

## 2020-10-31 ENCOUNTER — Ambulatory Visit: Payer: Medicare HMO | Admitting: Physical Therapy

## 2020-10-31 ENCOUNTER — Other Ambulatory Visit: Payer: Self-pay

## 2020-10-31 ENCOUNTER — Encounter: Payer: Self-pay | Admitting: Physical Therapy

## 2020-10-31 DIAGNOSIS — M25562 Pain in left knee: Secondary | ICD-10-CM | POA: Diagnosis not present

## 2020-10-31 DIAGNOSIS — R262 Difficulty in walking, not elsewhere classified: Secondary | ICD-10-CM

## 2020-10-31 DIAGNOSIS — R600 Localized edema: Secondary | ICD-10-CM

## 2020-10-31 DIAGNOSIS — M545 Low back pain, unspecified: Secondary | ICD-10-CM | POA: Diagnosis not present

## 2020-10-31 DIAGNOSIS — R296 Repeated falls: Secondary | ICD-10-CM | POA: Diagnosis not present

## 2020-10-31 DIAGNOSIS — M25662 Stiffness of left knee, not elsewhere classified: Secondary | ICD-10-CM

## 2020-10-31 NOTE — Therapy (Signed)
[facility]. [place], [place], [address] Phone: [phone]   Fax:  [phone]  Physical Therapy Treatment  Patient Details  Name: Wanda Oneill MRN: [id number] Date of Birth: [birth year] Referring Provider (PT): Copland   Encounter Date: 10/31/2020   PT End of Session - 10/31/20 1602     Visit Number 24    Date for PT Re-Evaluation 12/24/20    Authorization Type [employer]    PT Start Time 2863    PT Stop Time 1613    PT Time Calculation (min) 48 min    Activity Tolerance Patient tolerated treatment well    Behavior During Therapy [facility] for tasks assessed/performed             Past Medical History:  Diagnosis Date   Diabetes mellitus    type II   Down syndrome    Family history of adverse reaction to anesthesia    mom has had n/v   Gastritis    Headache    Hepatic steatosis    Hidradenitis    Hypothyroidism    Irritable bowel syndrome    Periumbilical hernia    Pneumonia 03/2020   frequent    Restless legs    Tendonitis    chronic in left foot   Thyroid disease    hypothyroidism    Past Surgical History:  Procedure Laterality Date   AXILLARY HIDRADENITIS EXCISION Bilateral    CHOLECYSTECTOMY     HERNIA REPAIR     multiple   INCISIONAL HERNIA REPAIR  05/30/2015   LAPROSCOPIC   INCISIONAL HERNIA REPAIR N/A 05/30/2015   Procedure: LAPAROSCOPIC INCISIONAL HERNIA;  Surgeon: Rolm Bookbinder, MD;  Location: [facility];  Service: General;  Laterality: N/A;   INSERTION OF MESH N/A 05/30/2015   Procedure: INSERTION OF MESH;  Surgeon: Rolm Bookbinder, MD;  Location: [facility];  Service: General;  Laterality: N/A;   KNEE ARTHROSCOPY     right knee   LAPAROSCOPY N/A 05/30/2015   Procedure: LAPAROSCOPY DIAGNOSTIC;  Surgeon: Rolm Bookbinder, MD;  Location: [place];  Service: General;  Laterality: N/A;   PARTIAL KNEE ARTHROPLASTY Right 10/30/2017   Procedure: UNICOMPARTMENTAL RIGHT KNEE LATERAL;  Surgeon: Paralee Cancel, MD;  Location[facility];  Service: Orthopedics;  Laterality: Right;  90 mins   PARTIAL KNEE ARTHROPLASTY Left 06/29/2020   Procedure: UNICOMPARTMENTAL KNEE LATERALLY;  Surgeon: Paralee Cancel, MD;  Location: [facility];  Service: Orthopedics;  Laterality: Left;  70 MINS   TONSILLECTOMY     adenoids    There were no vitals filed for this visit.   Subjective Assessment - 10/31/20 1529     Subjective No falls, reports that she is sore, reports tha tshe is walkig more.    Currently in Pain? Yes    Pain Score 4     Pain Location Knee   does have some back pain   Pain Orientation Left    Pain Descriptors / Indicators Sore                               [facility] Adult PT Treatment/Exercise - 10/31/20 0001       Ambulation/Gait   Gait Comments outside negotiating curbs, slopes and walked in the grass.  Had one instance of the left leg giving on the uneven grass, this again seemed to really be scary to her., Had her walk 2 laps in the clinic without  device and cues for speed      High Level Balance   High Level Balance Comments cone touches with and without SPC      Knee/Hip Exercises: Seated   Long Arc Quad Left;3 sets;10 reps    Long Arc Quad Weight 5 lbs.    Ball Squeeze 20      Knee/Hip Exercises: Supine   Short Arc Quad Sets Left;3 sets;10 reps    Short Arc Quad Sets Limitations 3#    Bridges 2 sets;10 reps                       PT Short Term Goals - 07/13/20 1753       PT SHORT TERM GOAL #1   Title independent with initial HEP    Status Achieved               PT Long Term Goals - 10/31/20 1614       PT LONG TERM GOAL #2   Title dcrease pain 50%    Status Partially Met                   Plan - 10/31/20 1603     Clinical Impression Statement Patient seems to be having a little more fear and hesitancy.  I had her go outside and walk with a SPC and me being close by up down slope, negotiating curbs, She is very hesitant  on the curbs.  Needs CGA to do this, in the grass had one instance of the knee buckling, then the fear again, she struggles with LAQ for th efirst 3 and then does better.does need a lot of cues    PT Next Visit Plan continue to work on her function and decrease her fear and hesitation    Consulted and Agree with Plan of Care Patient             Patient will benefit from skilled therapeutic intervention in order to improve the following deficits and impairments:  Abnormal gait, Decreased range of motion, Difficulty walking, Decreased activity tolerance, Pain, Impaired flexibility, Decreased balance, Decreased mobility, Decreased strength, Increased edema, Decreased scar mobility, Postural dysfunction, Increased muscle spasms  Visit Diagnosis: Difficulty in walking, not elsewhere classified  Localized edema  Acute pain of left knee  Acute bilateral low back pain without sciatica  Knee stiff, left  Repeated falls     Problem List Patient Active Problem List   Diagnosis Date Noted   S/P left unicompartmental knee replacement, lateral 06/29/2020   At risk for obstructive sleep apnea 06/11/2019   Cough 06/11/2019   Multifocal pneumonia    Acute respiratory failure with hypoxia ([place]) 05/26/2019   Hyperglycemia 05/26/2019   Acute respiratory failure with hypoxemia ([place]) 05/23/2019   Acute hypoxemic respiratory failure ([place]) 05/20/2019   Fibromyalgia 03/09/2019   Neck pain, chronic 03/09/2019   Acute shoulder bursitis, right 02/03/2019   Bile salt-induced diarrhea 09/25/2018   Arthritis of left acromioclavicular joint 09/23/2018   Precordial chest pain 08/11/2018   Morbid obesity ([place]) 10/31/2017   S/P right UKR, lateral 10/30/2017   Incarcerated epigastric hernia 05/30/2015   Left foot pain 08/09/2014   Chronic diarrhea 07/27/2014   Gout 08/10/2012   Acid reflux 01/22/2012   Down syndrome 11/11/2011   Hernia of abdominal wall 10/21/2011   Psoriasis 10/21/2011   Type 2  diabetes mellitus, uncontrolled ([place]) 04/12/2011   Hypothyroid 04/12/2011   Gait abnormality 06/22/2010   Tibial tendinitis,  posterior 06/22/2010    Sumner Boast, PT 10/31/2020, 4:15 PM  [facility]. [place], [place], [address] Phone: [phone]   Fax:  [phone]  Name: Wanda Oneill MRN: [id number] Date of Birth: 1980-07-09

## 2020-11-06 ENCOUNTER — Encounter: Payer: Self-pay | Admitting: Physical Therapy

## 2020-11-06 ENCOUNTER — Ambulatory Visit: Payer: Medicare HMO | Attending: Orthopedic Surgery | Admitting: Physical Therapy

## 2020-11-06 ENCOUNTER — Other Ambulatory Visit: Payer: Self-pay

## 2020-11-06 DIAGNOSIS — M25562 Pain in left knee: Secondary | ICD-10-CM | POA: Diagnosis not present

## 2020-11-06 DIAGNOSIS — M545 Low back pain, unspecified: Secondary | ICD-10-CM | POA: Insufficient documentation

## 2020-11-06 DIAGNOSIS — R600 Localized edema: Secondary | ICD-10-CM | POA: Insufficient documentation

## 2020-11-06 DIAGNOSIS — R262 Difficulty in walking, not elsewhere classified: Secondary | ICD-10-CM | POA: Insufficient documentation

## 2020-11-06 DIAGNOSIS — R296 Repeated falls: Secondary | ICD-10-CM | POA: Diagnosis not present

## 2020-11-06 DIAGNOSIS — M25662 Stiffness of left knee, not elsewhere classified: Secondary | ICD-10-CM | POA: Insufficient documentation

## 2020-11-06 NOTE — Therapy (Signed)
[facility]. [place], [place], [address] Phone: [phone]   Fax:  [phone]  Physical Therapy Treatment  Patient Details  Name: Wanda Oneill MRN: [id number] Date of Birth: [birth year] Referring Provider (PT): Copland   Encounter Date: 11/06/2020   PT End of Session - 11/06/20 1654     Visit Number 25    Number of Visits 33    Date for PT Re-Evaluation 12/24/20    Authorization Type [employer]    PT Start Time 4403    PT Stop Time 1700    PT Time Calculation (min) 50 min    Activity Tolerance Patient tolerated treatment well    Behavior During Therapy WFL for tasks assessed/performed             Past Medical History:  Diagnosis Date   Diabetes mellitus    type II   Down syndrome    Family history of adverse reaction to anesthesia    mom has had n/v   Gastritis    Headache    Hepatic steatosis    Hidradenitis    Hypothyroidism    Irritable bowel syndrome    Periumbilical hernia    Pneumonia 03/2020   frequent    Restless legs    Tendonitis    chronic in left foot   Thyroid disease    hypothyroidism    Past Surgical History:  Procedure Laterality Date   AXILLARY HIDRADENITIS EXCISION Bilateral    CHOLECYSTECTOMY     HERNIA REPAIR     multiple   INCISIONAL HERNIA REPAIR  05/30/2015   LAPROSCOPIC   INCISIONAL HERNIA REPAIR N/A 05/30/2015   Procedure: LAPAROSCOPIC INCISIONAL HERNIA;  Surgeon: Rolm Bookbinder, MD;  Location: [facility];  Service: General;  Laterality: N/A;   INSERTION OF MESH N/A 05/30/2015   Procedure: INSERTION OF MESH;  Surgeon: Rolm Bookbinder, MD;  Location: [facility];  Service: General;  Laterality: N/A;   KNEE ARTHROSCOPY     right knee   LAPAROSCOPY N/A 05/30/2015   Procedure: LAPAROSCOPY DIAGNOSTIC;  Surgeon: Rolm Bookbinder, MD;  Location: [facility];  Service: General;  Laterality: N/A;   PARTIAL KNEE ARTHROPLASTY Right 10/30/2017   Procedure: UNICOMPARTMENTAL RIGHT KNEE  LATERAL;  Surgeon: Paralee Cancel, MD;  [facility];  Service: Orthopedics;  Laterality: Right;  90 mins   PARTIAL KNEE ARTHROPLASTY Left 06/29/2020   Procedure: UNICOMPARTMENTAL KNEE LATERALLY;  Surgeon: Paralee Cancel, MD;  [facility];  Service: Orthopedics;  Laterality: Left;  70 MINS   TONSILLECTOMY     adenoids    There were no vitals filed for this visit.   Subjective Assessment - 11/06/20 1617     Subjective I feel a little better, still hurts    Currently in Pain? Yes    Pain Score 4     Pain Location Knee    Pain Orientation Left    Pain Descriptors / Indicators Sore    Aggravating Factors  walking, activity                               [facility] Adult PT Treatment/Exercise - 11/06/20 0001       Ambulation/Gait   Gait Comments walking outside, negotiating curbs, slopes, up the hill she wanted to have a little bit of hand hold, went a total of 300 feet, she was fatigued coming up the hill.  I had her do some  walking in the clinic without device, cues needed to not creep along the walls      High Level Balance   High Level Balance Comments cone touches with and without SPC, ball toss, standing on airex head      Knee/Hip Exercises: Aerobic   Nustep L 5 6 min UE and LE      Knee/Hip Exercises: Machines for Strengthening   Cybex Knee Extension 5# 2x10      Knee/Hip Exercises: Standing   Hip Abduction Both;2 sets;10 reps      Knee/Hip Exercises: Seated   Ball Squeeze 20      Knee/Hip Exercises: Supine   Bridges 2 sets;10 reps                       PT Short Term Goals - 07/13/20 1753       PT SHORT TERM GOAL #1   Title independent with initial HEP    Status Achieved               PT Long Term Goals - 11/06/20 1656       PT LONG TERM GOAL #1   Title understand RICE    Status Achieved      PT LONG TERM GOAL #2   Title dcrease pain 50%    Status Partially Met                   Plan - 11/06/20 1654      Clinical Impression Statement Patient did better today, she still has fear and hesitancy, was able to walk in the clinic without device but still wanted to use the walls for assist.  She is doing better with the [facility] when outside but still with isses of fear and at times will want to hold onto something, the legs are weak    PT Next Visit Plan continue to work on her function and decrease her fear and hesitation    Consulted and Agree with Plan of Care Patient             Patient will benefit from skilled therapeutic intervention in order to improve the following deficits and impairments:  Abnormal gait, Decreased range of motion, Difficulty walking, Decreased activity tolerance, Pain, Impaired flexibility, Decreased balance, Decreased mobility, Decreased strength, Increased edema, Decreased scar mobility, Postural dysfunction, Increased muscle spasms  Visit Diagnosis: Difficulty in walking, not elsewhere classified  Localized edema  Acute pain of left knee  Acute bilateral low back pain without sciatica  Knee stiff, left  Repeated falls     Problem List Patient Active Problem List   Diagnosis Date Noted   S/P left unicompartmental knee replacement, lateral 06/29/2020   At risk for obstructive sleep apnea 06/11/2019   Cough 06/11/2019   Multifocal pneumonia    Acute respiratory failure with hypoxia ([place]) 05/26/2019   Hyperglycemia 05/26/2019   Acute respiratory failure with hypoxemia ([place]) 05/23/2019   Acute hypoxemic respiratory failure ([place]) 05/20/2019   Fibromyalgia 03/09/2019   Neck pain, chronic 03/09/2019   Acute shoulder bursitis, right 02/03/2019   Bile salt-induced diarrhea 09/25/2018   Arthritis of left acromioclavicular joint 09/23/2018   Precordial chest pain 08/11/2018   Morbid obesity ([place]) 10/31/2017   S/P right UKR, lateral 10/30/2017   Incarcerated epigastric hernia 05/30/2015   Left foot pain 08/09/2014   Chronic diarrhea 07/27/2014   Gout  08/10/2012   Acid reflux 01/22/2012   Down syndrome 11/11/2011   Hernia of  abdominal wall 10/21/2011   Psoriasis 10/21/2011   Type 2 diabetes mellitus, uncontrolled 04/12/2011   Hypothyroid 04/12/2011   Gait abnormality 06/22/2010   Tibial tendinitis, posterior 06/22/2010    Sumner Boast, PT 11/06/2020, 4:58 PM  [facility]. [place], [place], [address] Phone: [phone]   Fax:  [phone]  Name: Wanda Oneill MRN: [id number] Date of Birth: Oct 06, 1980

## 2020-11-14 ENCOUNTER — Other Ambulatory Visit: Payer: Self-pay | Admitting: Family Medicine

## 2020-11-14 ENCOUNTER — Other Ambulatory Visit: Payer: Self-pay

## 2020-11-14 ENCOUNTER — Ambulatory Visit: Payer: Medicare HMO | Admitting: Physical Therapy

## 2020-11-14 ENCOUNTER — Encounter: Payer: Self-pay | Admitting: Physical Therapy

## 2020-11-14 DIAGNOSIS — M545 Low back pain, unspecified: Secondary | ICD-10-CM | POA: Diagnosis not present

## 2020-11-14 DIAGNOSIS — R21 Rash and other nonspecific skin eruption: Secondary | ICD-10-CM

## 2020-11-14 DIAGNOSIS — M25662 Stiffness of left knee, not elsewhere classified: Secondary | ICD-10-CM | POA: Diagnosis not present

## 2020-11-14 DIAGNOSIS — M25562 Pain in left knee: Secondary | ICD-10-CM | POA: Diagnosis not present

## 2020-11-14 DIAGNOSIS — R600 Localized edema: Secondary | ICD-10-CM

## 2020-11-14 DIAGNOSIS — R296 Repeated falls: Secondary | ICD-10-CM | POA: Diagnosis not present

## 2020-11-14 DIAGNOSIS — R262 Difficulty in walking, not elsewhere classified: Secondary | ICD-10-CM

## 2020-11-14 NOTE — Therapy (Signed)
[facility]. [place], [place], [address] Phone: [phone]   Fax:  [phone]  Physical Therapy Treatment  Patient Details  Name: Wanda Oneill MRN: [id number] Date of Birth: [birth year] Referring Provider (PT): Copland   Encounter Date: 11/14/2020   PT End of Session - 11/14/20 1746     Visit Number 26    Number of Visits 33    Date for PT Re-Evaluation 12/24/20    Authorization Type [employer]    PT Start Time 6270    PT Stop Time 3500    PT Time Calculation (min) 52 min    Activity Tolerance Patient tolerated treatment well    Behavior During Therapy WFL for tasks assessed/performed             Past Medical History:  Diagnosis Date   Diabetes mellitus    type II   Down syndrome    Family history of adverse reaction to anesthesia    mom has had n/v   Gastritis    Headache    Hepatic steatosis    Hidradenitis    Hypothyroidism    Irritable bowel syndrome    Periumbilical hernia    Pneumonia 03/2020   frequent    Restless legs    Tendonitis    chronic in left foot   Thyroid disease    hypothyroidism    Past Surgical History:  Procedure Laterality Date   AXILLARY HIDRADENITIS EXCISION Bilateral    CHOLECYSTECTOMY     HERNIA REPAIR     multiple   INCISIONAL HERNIA REPAIR  05/30/2015   LAPROSCOPIC   INCISIONAL HERNIA REPAIR N/A 05/30/2015   Procedure: LAPAROSCOPIC INCISIONAL HERNIA;  Surgeon: Rolm Bookbinder, MD;  Location: [facility];  Service: General;  Laterality: N/A;   INSERTION OF MESH N/A 05/30/2015   Procedure: INSERTION OF MESH;  Surgeon: Rolm Bookbinder, MD;  Location: [facility];  Service: General;  Laterality: N/A;   KNEE ARTHROSCOPY     right knee   LAPAROSCOPY N/A 05/30/2015   Procedure: LAPAROSCOPY DIAGNOSTIC;  Surgeon: Rolm Bookbinder, MD;  Location: [facility];  Service: General;  Laterality: N/A;   PARTIAL KNEE ARTHROPLASTY Right 10/30/2017   Procedure: UNICOMPARTMENTAL RIGHT KNEE  LATERAL;  Surgeon: Paralee Cancel, MD;  Location: [facility];  Service: Orthopedics;  Laterality: Right;  90 mins   PARTIAL KNEE ARTHROPLASTY Left 06/29/2020   Procedure: UNICOMPARTMENTAL KNEE LATERALLY;  Surgeon: Paralee Cancel, MD;  Location: [facility];  Service: Orthopedics;  Laterality: Left;  70 MINS   TONSILLECTOMY     adenoids    There were no vitals filed for this visit.   Subjective Assessment - 11/14/20 1707     Subjective Patient reports that she had a fall yesterday at school, reports that the left knee "just gav out".  She reports no injuries but still frightened by this.  Was able to get up with help and then walk without difficulty    Currently in Pain? Yes    Pain Score 6     Pain Location Knee    Pain Orientation Left    Pain Descriptors / Indicators Sore;Aching    Aggravating Factors  falling                [facility] PT Assessment - 11/14/20 0001       AROM   Left Knee Extension 10    Left Knee Flexion 100      Strength   Left Knee Flexion  4-/5    Left Knee Extension 3+/5                           [facility] Adult PT Treatment/Exercise - 11/14/20 0001       High Level Balance   High Level Balance Activities Side stepping    High Level Balance Comments standing ball toss,, cone touches with SPC, left leg SLS      Knee/Hip Exercises: Aerobic   Nustep Level 5 x 6 minutes, slower pace LE only      Knee/Hip Exercises: Machines for Strengthening   Cybex Knee Extension 5# 2x10    Cybex Knee Flexion 20# 2x10    Cybex Leg Press 20# 15 x BIL- cued for full TKE      Vasopneumatic   Number Minutes Vasopneumatic  10 minutes    Vasopnuematic Location  Knee    Vasopneumatic Pressure Medium    Vasopneumatic Temperature  39                       PT Short Term Goals - 07/13/20 1753       PT SHORT TERM GOAL #1   Title independent with initial HEP    Status Achieved               PT Long Term Goals - 11/14/20 1752       PT LONG  TERM GOAL #2   Title dcrease pain 50%    Status Partially Met      PT LONG TERM GOAL #3   Title increase left knee AROM to 0-120 degrees flexion    Status Partially Met      PT LONG TERM GOAL #4   Title walk on all surfaces without limp and with minimal; pain    Status Partially Met                   Plan - 11/14/20 1747     Clinical Impression Statement Patient had a fall yesterday, she comes in using the walls and furniture with the [facility] for balance, she is more hesitant and taking step to pattern gait.  She is back to not trusting the leg, she turns it out some, she reports that the fall her left leg just buckled on her, I really think that since the falling started she has been afraid to use the leg and it is weak and she ahs lost some proprioception and coordination.  I witnessed one instance with her just standing today where she reported the knee buckled and it did but seemd more like it went back into extension fast which scared her and she brought the knee out of extension fast with no control and needed Min A to right self    PT Next Visit Plan really need to work on the strength, coordination and control of the left knee and LE    Consulted and Agree with Plan of Care Patient             Patient will benefit from skilled therapeutic intervention in order to improve the following deficits and impairments:  Abnormal gait, Decreased range of motion, Difficulty walking, Decreased activity tolerance, Pain, Impaired flexibility, Decreased balance, Decreased mobility, Decreased strength, Increased edema, Decreased scar mobility, Postural dysfunction, Increased muscle spasms  Visit Diagnosis: Difficulty in walking, not elsewhere classified  Localized edema  Acute pain of left knee  Acute bilateral low back pain  without sciatica  Knee stiff, left  Repeated falls     Problem List Patient Active Problem List   Diagnosis Date Noted   S/P left unicompartmental  knee replacement, lateral 06/29/2020   At risk for obstructive sleep apnea 06/11/2019   Cough 06/11/2019   Multifocal pneumonia    Acute respiratory failure with hypoxia ([place]) 05/26/2019   Hyperglycemia 05/26/2019   Acute respiratory failure with hypoxemia ([place]) 05/23/2019   Acute hypoxemic respiratory failure ([place]) 05/20/2019   Fibromyalgia 03/09/2019   Neck pain, chronic 03/09/2019   Acute shoulder bursitis, right 02/03/2019   Bile salt-induced diarrhea 09/25/2018   Arthritis of left acromioclavicular joint 09/23/2018   Precordial chest pain 08/11/2018   Morbid obesity ([place]) 10/31/2017   S/P right UKR, lateral 10/30/2017   Incarcerated epigastric hernia 05/30/2015   Left foot pain 08/09/2014   Chronic diarrhea 07/27/2014   Gout 08/10/2012   Acid reflux 01/22/2012   Down syndrome 11/11/2011   Hernia of abdominal wall 10/21/2011   Psoriasis 10/21/2011   Type 2 diabetes mellitus, uncontrolled 04/12/2011   Hypothyroid 04/12/2011   Gait abnormality 06/22/2010   Tibial tendinitis, posterior 06/22/2010    Sumner Boast, PT 11/14/2020, 5:54 PM  [facility]. [place], [place], [address] Phone: [phone]   Fax:  [phone]  Name: Wanda Oneill MRN: [id number] Date of Birth: [birth year]

## 2020-11-23 ENCOUNTER — Other Ambulatory Visit: Payer: Self-pay

## 2020-11-23 ENCOUNTER — Encounter: Payer: Self-pay | Admitting: Physical Therapy

## 2020-11-23 ENCOUNTER — Ambulatory Visit: Payer: Medicare HMO | Admitting: Physical Therapy

## 2020-11-23 DIAGNOSIS — M25662 Stiffness of left knee, not elsewhere classified: Secondary | ICD-10-CM

## 2020-11-23 DIAGNOSIS — R600 Localized edema: Secondary | ICD-10-CM | POA: Diagnosis not present

## 2020-11-23 DIAGNOSIS — R262 Difficulty in walking, not elsewhere classified: Secondary | ICD-10-CM

## 2020-11-23 DIAGNOSIS — R296 Repeated falls: Secondary | ICD-10-CM

## 2020-11-23 DIAGNOSIS — M25562 Pain in left knee: Secondary | ICD-10-CM | POA: Diagnosis not present

## 2020-11-23 DIAGNOSIS — M545 Low back pain, unspecified: Secondary | ICD-10-CM | POA: Diagnosis not present

## 2020-11-23 NOTE — Therapy (Signed)
[facility]. [place], [place], [address] Phone: [phone]   Fax:  [phone]  Physical Therapy Treatment  Patient Details  Name: CAMELLIA POPESCU MRN: [id number] Date of Birth: 1980-05-17 Referring Provider (PT): Copland   Encounter Date: 11/23/2020   PT End of Session - 11/23/20 1604     Visit Number 27    Number of Visits 33    Date for PT Re-Evaluation 12/24/20    Authorization Type [employer]    PT Start Time 9935    PT Stop Time 1625    PT Time Calculation (min) 55 min    Activity Tolerance Patient tolerated treatment well    Behavior During Therapy WFL for tasks assessed/performed             Past Medical History:  Diagnosis Date   Diabetes mellitus    type II   Down syndrome    Family history of adverse reaction to anesthesia    mom has had n/v   Gastritis    Headache    Hepatic steatosis    Hidradenitis    Hypothyroidism    Irritable bowel syndrome    Periumbilical hernia    Pneumonia 03/2020   frequent    Restless legs    Tendonitis    chronic in left foot   Thyroid disease    hypothyroidism    Past Surgical History:  Procedure Laterality Date   AXILLARY HIDRADENITIS EXCISION Bilateral    CHOLECYSTECTOMY     HERNIA REPAIR     multiple   INCISIONAL HERNIA REPAIR  05/30/2015   LAPROSCOPIC   INCISIONAL HERNIA REPAIR N/A 05/30/2015   Procedure: LAPAROSCOPIC INCISIONAL HERNIA;  Surgeon: Rolm Bookbinder, MD;  Location: [facility];  Service: General;  Laterality: N/A;   INSERTION OF MESH N/A 05/30/2015   Procedure: INSERTION OF MESH;  Surgeon: Rolm Bookbinder, MD;  Location: [facility];  Service: General;  Laterality: N/A;   KNEE ARTHROSCOPY     right knee   LAPAROSCOPY N/A 05/30/2015   Procedure: LAPAROSCOPY DIAGNOSTIC;  Surgeon: Rolm Bookbinder, MD;  Location: [facility];  Service: General;  Laterality: N/A;   PARTIAL KNEE ARTHROPLASTY Right 10/30/2017   Procedure: UNICOMPARTMENTAL RIGHT KNEE  LATERAL;  Surgeon: Paralee Cancel, MD;  Location: [facility];  Service: Orthopedics;  Laterality: Right;  90 mins   PARTIAL KNEE ARTHROPLASTY Left 06/29/2020   Procedure: UNICOMPARTMENTAL KNEE LATERALLY;  Surgeon: Paralee Cancel, MD;  Location: [facility];  Service: Orthopedics;  Laterality: Left;  70 MINS   TONSILLECTOMY     adenoids    There were no vitals filed for this visit.   Subjective Assessment - 11/23/20 1533     Subjective no falls, doing okay, I am sore    Currently in Pain? Yes    Pain Score 5     Pain Location Knee    Pain Orientation Left    Pain Descriptors / Indicators Sore                [facility] PT Assessment - 11/23/20 0001       AROM   Left Knee Extension 0    Left Knee Flexion 101      Strength   Left Knee Flexion 4-/5    Left Knee Extension 4-/5      Timed Up and Go Test   Normal TUG (seconds) 22    TUG Comments with SPC, did without assistive device in 22 seconds  [facility] Adult PT Treatment/Exercise - 11/23/20 0001       Ambulation/Gait   Gait Comments gait outside with SPC and HHA, very fearful and slow, much better up the slope, dis stairs with close SBA, in the clinic with and without SPC      High Level Balance   High Level Balance Activities Side stepping;Backward walking    High Level Balance Comments standing ball toss,, cone touches with SPC, left leg SLS      Knee/Hip Exercises: Aerobic   Nustep Level 5 x 6 minutes, slower pace LE only                       PT Short Term Goals - 07/13/20 1753       PT SHORT TERM GOAL #1   Title independent with initial HEP    Status Achieved               PT Long Term Goals - 11/23/20 1607       PT LONG TERM GOAL #2   Title dcrease pain 50%    Status Partially Met      PT LONG TERM GOAL #3   Title increase left knee AROM to 0-120 degrees flexion    Status Partially Met                   Plan - 11/23/20 1604     Clinical  Impression Statement Patient still very timid and fearful with walking, going down the hill she really goes slow and wants to hold onto me.  Going up she does much better.  She tells me that she does have pain but when I ask why she goes so slow she reports that she is afraid the knee will give out.  Her ROM and her TUG time have improved.    PT Next Visit Plan really need to work on the strength, coordination and control of the left knee and LE    Consulted and Agree with Plan of Care Patient             Patient will benefit from skilled therapeutic intervention in order to improve the following deficits and impairments:  Abnormal gait, Decreased range of motion, Difficulty walking, Decreased activity tolerance, Pain, Impaired flexibility, Decreased balance, Decreased mobility, Decreased strength, Increased edema, Decreased scar mobility, Postural dysfunction, Increased muscle spasms  Visit Diagnosis: Difficulty in walking, not elsewhere classified  Localized edema  Acute pain of left knee  Acute bilateral low back pain without sciatica  Knee stiff, left  Repeated falls     Problem List Patient Active Problem List   Diagnosis Date Noted   S/P left unicompartmental knee replacement, lateral 06/29/2020   At risk for obstructive sleep apnea 06/11/2019   Cough 06/11/2019   Multifocal pneumonia    Acute respiratory failure with hypoxia ([place]) 05/26/2019   Hyperglycemia 05/26/2019   Acute respiratory failure with hypoxemia ([place]) 05/23/2019   Acute hypoxemic respiratory failure ([facility]) 05/20/2019   Fibromyalgia 03/09/2019   Neck pain, chronic 03/09/2019   Acute shoulder bursitis, right 02/03/2019   Bile salt-induced diarrhea 09/25/2018   Arthritis of left acromioclavicular joint 09/23/2018   Precordial chest pain 08/11/2018   Morbid obesity ([place]) 10/31/2017   S/P right UKR, lateral 10/30/2017   Incarcerated epigastric hernia 05/30/2015   Left foot pain 08/09/2014   Chronic  diarrhea 07/27/2014   Gout 08/10/2012   Acid reflux 01/22/2012   Down syndrome 11/11/2011  Hernia of abdominal wall 10/21/2011   Psoriasis 10/21/2011   Type 2 diabetes mellitus, uncontrolled 04/12/2011   Hypothyroid 04/12/2011   Gait abnormality 06/22/2010   Tibial tendinitis, posterior 06/22/2010    Sumner Boast, PT 11/23/2020, 4:09 PM  [facility]. [place], [place], [address] Phone: [phone]   Fax:  [phone]  Name: AIJAH LATTNER MRN: [id number] Date of Birth: 1980-10-26

## 2020-11-26 ENCOUNTER — Emergency Department (HOSPITAL_BASED_OUTPATIENT_CLINIC_OR_DEPARTMENT_OTHER): Payer: Medicare HMO

## 2020-11-26 ENCOUNTER — Other Ambulatory Visit: Payer: Self-pay

## 2020-11-26 ENCOUNTER — Emergency Department (HOSPITAL_BASED_OUTPATIENT_CLINIC_OR_DEPARTMENT_OTHER)
Admission: EM | Admit: 2020-11-26 | Discharge: 2020-11-26 | Disposition: A | Discharge status: Home / Self Care | Payer: Medicare HMO | Attending: Emergency Medicine | Admitting: Emergency Medicine

## 2020-11-26 ENCOUNTER — Encounter (HOSPITAL_BASED_OUTPATIENT_CLINIC_OR_DEPARTMENT_OTHER): Payer: Self-pay | Admitting: Emergency Medicine

## 2020-11-26 DIAGNOSIS — Z7984 Long term (current) use of oral hypoglycemic drugs: Secondary | ICD-10-CM | POA: Insufficient documentation

## 2020-11-26 DIAGNOSIS — L03011 Cellulitis of right finger: Secondary | ICD-10-CM | POA: Diagnosis not present

## 2020-11-26 DIAGNOSIS — Z794 Long term (current) use of insulin: Secondary | ICD-10-CM | POA: Insufficient documentation

## 2020-11-26 DIAGNOSIS — Z79899 Other long term (current) drug therapy: Secondary | ICD-10-CM | POA: Insufficient documentation

## 2020-11-26 DIAGNOSIS — E039 Hypothyroidism, unspecified: Secondary | ICD-10-CM | POA: Insufficient documentation

## 2020-11-26 DIAGNOSIS — Z96653 Presence of artificial knee joint, bilateral: Secondary | ICD-10-CM | POA: Insufficient documentation

## 2020-11-26 DIAGNOSIS — M79644 Pain in right finger(s): Secondary | ICD-10-CM | POA: Diagnosis present

## 2020-11-26 DIAGNOSIS — Z1389 Encounter for screening for other disorder: Secondary | ICD-10-CM | POA: Diagnosis not present

## 2020-11-26 DIAGNOSIS — E119 Type 2 diabetes mellitus without complications: Secondary | ICD-10-CM | POA: Insufficient documentation

## 2020-11-26 DIAGNOSIS — L03019 Cellulitis of unspecified finger: Secondary | ICD-10-CM

## 2020-11-26 DIAGNOSIS — M7989 Other specified soft tissue disorders: Secondary | ICD-10-CM | POA: Diagnosis not present

## 2020-11-26 MED ORDER — LIDOCAINE HCL (PF) 1 % IJ SOLN
30.0000 mL | Freq: Once | INTRAMUSCULAR | Status: DC
  Filled 2020-11-26: qty 30

## 2020-11-26 MED ORDER — DOXYCYCLINE HYCLATE 100 MG PO CAPS: 100.0000 mg | ORAL_CAPSULE | Freq: Two times a day (BID) | ORAL | 0 refills | Status: AC

## 2020-11-26 NOTE — ED Provider Notes (Signed)
[facility] EMERGENCY DEPARTMENT Provider Note   CSN: [id number] Arrival date & time: 11/26/20  1354     History Chief Complaint  Patient presents with   Finger Injury    Wanda Oneill is a 40 y.o. female. With history of diabetes type 2, Down syndrome, hypothyroidism. Presents the emergency department with 1 week of worsening right middle finger nailbed surrounding redness and swelling.  No drainage from the area.  Patient denies traumatic injury.  She has associated small ulcerated lesion at the tip of her right middle finger, per patient, this has only been there for about 3 days.  She presents with her father who is a family physician, who states that he does not think that there is any way that that has only been there for 3 days.  Patient denies any fever or chills. HPI     Past Medical History:  Diagnosis Date   Diabetes mellitus    type II   Down syndrome    Family history of adverse reaction to anesthesia    mom has had n/v   Gastritis    Headache    Hepatic steatosis    Hidradenitis    Hypothyroidism    Irritable bowel syndrome    Periumbilical hernia    Pneumonia 03/2020   frequent    Restless legs    Tendonitis    chronic in left foot   Thyroid disease    hypothyroidism    Patient Active Problem List   Diagnosis Date Noted   S/P left unicompartmental knee replacement, lateral 06/29/2020   At risk for obstructive sleep apnea 06/11/2019   Cough 06/11/2019   Multifocal pneumonia    Acute respiratory failure with hypoxia ([place]) 05/26/2019   Hyperglycemia 05/26/2019   Acute respiratory failure with hypoxemia ([place]) 05/23/2019   Acute hypoxemic respiratory failure ([place]) 05/20/2019   Fibromyalgia 03/09/2019   Neck pain, chronic 03/09/2019   Acute shoulder bursitis, right 02/03/2019   Bile salt-induced diarrhea 09/25/2018   Arthritis of left acromioclavicular joint 09/23/2018   Precordial chest pain 08/11/2018   Morbid obesity ([place]) 10/31/2017    S/P right UKR, lateral 10/30/2017   Incarcerated epigastric hernia 05/30/2015   Left foot pain 08/09/2014   Chronic diarrhea 07/27/2014   Gout 08/10/2012   Acid reflux 01/22/2012   Down syndrome 11/11/2011   Hernia of abdominal wall 10/21/2011   Psoriasis 10/21/2011   Type 2 diabetes mellitus, uncontrolled 04/12/2011   Hypothyroid 04/12/2011   Gait abnormality 06/22/2010   Tibial tendinitis, posterior 06/22/2010    Past Surgical History:  Procedure Laterality Date   AXILLARY HIDRADENITIS EXCISION Bilateral    CHOLECYSTECTOMY     HERNIA REPAIR     multiple   INCISIONAL HERNIA REPAIR  05/30/2015   LAPROSCOPIC   INCISIONAL HERNIA REPAIR N/A 05/30/2015   Procedure: LAPAROSCOPIC INCISIONAL HERNIA;  Surgeon: Rolm Bookbinder, MD;  Location: [facility];  Service: General;  Laterality: N/A;   INSERTION OF MESH N/A 05/30/2015   Procedure: INSERTION OF MESH;  Surgeon: Rolm Bookbinder, MD;  Location: [facility];  Service: General;  Laterality: N/A;   KNEE ARTHROSCOPY     right knee   LAPAROSCOPY N/A 05/30/2015   Procedure: LAPAROSCOPY DIAGNOSTIC;  Surgeon: Rolm Bookbinder, MD;  Location: [facility];  Service: General;  Laterality: N/A;   PARTIAL KNEE ARTHROPLASTY Right 10/30/2017   Procedure: UNICOMPARTMENTAL RIGHT KNEE LATERAL;  Surgeon: Paralee Cancel, MD;  Location: [facility];  Service: Orthopedics;  Laterality: Right;  90 mins  PARTIAL KNEE ARTHROPLASTY Left 06/29/2020   Procedure: UNICOMPARTMENTAL KNEE LATERALLY;  Surgeon: Paralee Cancel, MD;  Location: [facility];  Service: Orthopedics;  Laterality: Left;  70 MINS   TONSILLECTOMY     adenoids     OB History   No obstetric history on file.     Family History  Problem Relation Age of Onset   Thyroid cancer Mother    Diabetes type II Mother    High Cholesterol Mother    Heart disease Mother    Diabetes type II Father    Hypertension Father    Stroke Father     Social History   Tobacco Use   Smoking status: Never   Smokeless tobacco:  Never  Vaping Use   Vaping Use: Never used  Substance Use Topics   Alcohol use: No    Alcohol/week: 0.0 standard drinks   Drug use: No    Home Medications Prior to Admission medications   Medication Sig Start Date End Date Taking? Authorizing Provider  doxycycline (VIBRAMYCIN) 100 MG capsule Take 1 capsule (100 mg total) by mouth 2 (two) times daily for 7 days. 11/26/20 12/03/20 Yes Avagrace Botelho, Adora Fridge, PA-C  acetaminophen (TYLENOL) 325 MG tablet Take 650 mg by mouth every 6 (six) hours as needed for mild pain.     [provider]  allopurinol (ZYLOPRIM) 300 MG tablet Take 1 tablet by mouth once daily 07/12/20   Copland, Gay Filler, MD  Blood Glucose Monitoring Suppl (BLOOD GLUCOSE METER KIT AND SUPPLIES) Dispense based on patient and insurance preference. To check blood sugars daily FOR ICD-9 250.00 Patient taking differently: 1 each by Other route See admin instructions. Dispense based on patient and insurance preference. To check blood sugars daily FOR ICD-9 250.00 08/31/13   Darlyne Russian, MD  Blood Glucose Monitoring Suppl (TRUE METRIX METER) w/Device KIT Use as directed to check glucose up to two times daily. Dx Code E11.9 08/16/19   Copland, Gay Filler, MD  cholestyramine (QUESTRAN) 4 g packet DISSOLVE & TAKE 1 PACKET OF POWDER BY MOUTH TWICE DAILY Patient taking differently: Take 4 g by mouth 2 (two) times daily. 01/05/20   Copland, Gay Filler, MD  dicyclomine (BENTYL) 20 MG tablet Take 20 mg by mouth daily as needed for spasms.    [provider]  empagliflozin (JARDIANCE) 25 MG TABS tablet Take 25 mg by mouth daily.    [provider]  Glucose Blood (BLOOD GLUCOSE TEST STRIPS) STRP 1 each by Other route See admin instructions. Use to test blood sugar up to twice a day 08/13/19   Copland, Gay Filler, MD  glucose blood (FREESTYLE TEST STRIPS) test strip Use as directed to test blood sugar up to twice a day. Dx code E11.9 08/16/19   Copland, Gay Filler, MD  glucose blood  (TRUE METRIX BLOOD GLUCOSE TEST) test strip Use as instructed to check glucose up to 2 times daily. Dx Code E11.9 08/16/19   Copland, Gay Filler, MD  HYDROcodone-acetaminophen (NORCO/VICODIN) 5-325 MG tablet Take 1-2 tablets by mouth every 6 (six) hours as needed for severe pain. Patient not taking: Reported on 10/25/2020 06/30/20   Irving Copas, PA-C  levonorgestrel-ethinyl estradiol (JOLESSA) 0.15-0.03 MG tablet Take 1 tablet by mouth once daily 10/12/20   Copland, Gay Filler, MD  levothyroxine (SYNTHROID) 137 MCG tablet TAKE 1 TABLET BY MOUTH ONCE DAILY BEFORE BREAKFAST 09/04/20   Copland, Gay Filler, MD  metFORMIN (GLUCOPHAGE) 1000 MG tablet Take 1,000 mg by mouth 2 (two)  times daily.  01/16/16   [provider]  methocarbamol (ROBAXIN) 500 MG tablet Take 1 tablet (500 mg total) by mouth every 6 (six) hours as needed for muscle spasms. Patient not taking: Reported on 10/25/2020 06/30/20   Irving Copas, PA-C  ONE TOUCH ULTRA TEST test strip USE TO CHECK BLOOD SUGAR ONCE DAILY (ICD CODE:25.00) Patient taking differently: 1 each by Other route daily. 05/08/13   Weber, Damaris Hippo, PA-C  TRUEplus Lancets 30G MISC Use as directed to check glucose up to two times daily. Dx code E11.9 08/16/19   Copland, Gay Filler, MD  TRULICITY 1.5 MW/4.1LK SOPN Inject 1.5 mg into the skin every Saturday. 04/10/20   [provider]  VASCEPA 1 g capsule Take 2 g by mouth 2 (two) times daily. 04/04/20   [provider]    Allergies    Vancomycin, Cefuroxime axetil, and Sulfa antibiotics  Review of Systems   Review of Systems  Constitutional:  Negative for chills, fatigue and fever.  HENT:  Negative for congestion, rhinorrhea and sore throat.   Eyes:  Negative for visual disturbance.  Respiratory:  Negative for cough, chest tightness and shortness of breath.   Cardiovascular:  Negative for chest pain, palpitations and leg swelling.  Gastrointestinal:  Negative for abdominal pain, blood in stool,  constipation, diarrhea, nausea and vomiting.  Genitourinary:  Negative for dysuria, flank pain and hematuria.  Musculoskeletal:  Negative for back pain.  Skin:  Positive for color change and wound. Negative for rash.  Neurological:  Negative for dizziness, syncope, weakness, light-headedness and headaches.  Psychiatric/Behavioral:  Negative for confusion.   All other systems reviewed and are negative.  Physical Exam Updated Vital Signs BP 138/81 (BP Location: Left Arm)   Pulse 91   Temp 98.1 F (36.7 C) (Oral)   Resp 20   Ht _0  (1.626 m)   Wt 106.6 kg   LMP  (LMP Unknown) Comment: due to birth control  SpO2 98%   BMI 40.34 kg/m   Physical Exam Vitals and nursing note reviewed.  Constitutional:      General: She is not in acute distress.    Appearance: Normal appearance. She is well-developed. She is not ill-appearing, toxic-appearing or diaphoretic.  HENT:     Head: Normocephalic and atraumatic.     Nose: No nasal deformity.     Mouth/Throat:     Lips: Pink. No lesions.  Eyes:     General: Gaze aligned appropriately. No scleral icterus.       Right eye: No discharge.        Left eye: No discharge.     Conjunctiva/sclera: Conjunctivae normal.     Right eye: Right conjunctiva is not injected. No exudate or hemorrhage.    Left eye: Left conjunctiva is not injected. No exudate or hemorrhage. Pulmonary:     Effort: Pulmonary effort is normal. No respiratory distress.  Skin:    General: Skin is warm and dry.     Findings: Wound present.     Comments: See photos below.  Patient has erythema and swelling surrounding right middle finger tip.  5 x 5 mm ulcerated area also at the tip of right middle finger.  No oozing or drainage noted.  Surrounding nailbed is macerated, however this has been present for about a year.  Neurological:     Mental Status: She is alert and oriented to person, place, and time.  Psychiatric:        Mood and Affect:  Mood normal.        Speech: Speech  normal.        Behavior: Behavior normal. Behavior is cooperative.         ED Results / Procedures / Treatments   Labs (all labs ordered are listed, but only abnormal results are displayed) Labs Reviewed - No data to display  EKG None  Radiology DG Finger Middle Right  Result Date: 11/26/2020 CLINICAL DATA:  INFECTION.  OSTEOMYELITIS SCREENING. EXAM: RIGHT MIDDLE FINGER 2+V COMPARISON:  None. FINDINGS: There is moderate soft tissue swelling of the third digit. No soft tissue gas or radiopaque foreign body. No acute fracture. No cortical erosion. IMPRESSION: Soft tissue swelling. Electronically Signed   By: Nolon Nations M.D.   On: 11/26/2020 18:26    Procedures .Marland KitchenIncision and Drainage  Date/Time: 11/26/2020 7:44 PM Performed by: Adolphus Birchwood, PA-C Authorized by: Adolphus Birchwood, PA-C   Consent:    Consent obtained:  Verbal   Consent given by:  Patient and parent   Risks, benefits, and alternatives were discussed: yes     Risks discussed:  Bleeding, incomplete drainage, pain, infection and damage to other organs   Alternatives discussed:  No treatment, delayed treatment and alternative treatment Universal protocol:    Procedure explained and questions answered to patient or proxy's satisfaction: yes     Patient identity confirmed:  Verbally with patient Location:    Type:  Abscess   Size:  1 cm   Location:  Upper extremity   Upper extremity location:  Finger   Finger location:  R long finger Pre-procedure details:    Skin preparation:  Povidone-iodine Sedation:    Sedation type:  None Anesthesia:    Anesthesia method:  Nerve block   Block location:  Digital   Block needle gauge:  27 G   Block anesthetic:  Lidocaine 1% w/o epi   Block injection procedure:  Anatomic landmarks identified, introduced needle, negative aspiration for blood and incremental injection   Block outcome:  Anesthesia achieved Procedure type:    Complexity:  Simple Procedure  details:    Ultrasound guidance: no     Needle aspiration: no     Incision types:  Stab incision   Incision depth:  Dermal   Drainage:  Bloody   Drainage amount:  Moderate   Wound treatment:  Wound left open   Packing materials:  None Post-procedure details:    Procedure completion:  Tolerated well, no immediate complications   Medications Ordered in ED Medications  lidocaine (PF) (XYLOCAINE) 1 % injection 30 mL (has no administration in time range)    ED Course  I have reviewed the triage vital signs and the nursing notes.  Pertinent labs & imaging results that were available during my care of the patient were reviewed by me and considered in my medical decision making (see chart for details).  Clinical Course as of 11/26/20 1947  Nancy Fetter Nov 26, 2020  1756 Obtaining x-ray of the finger. [GL]    Clinical Course User Index [GL] Jene Huq, Adora Fridge, PA-C   MDM Rules/Calculators/A&P                          Patient presents with redness and swelling to right middle finger that has been worsening for the past week.  She has a ulcerated area at the end of her right middle fingertip.  Unsure where this came from.  Patient states that it just started  appearing.  Afebrile and vitals are stable.  Appears to be erythema and swelling surrounding nail with no nailbed involvement.  Possible small drainable area with white surrounding skin surrounding ulcerated area.  Think this is likely a felon rather than paronychia. Finger x-ray with no evidence of osteomyelitis. Will attempt drainage of felon.  Digital nerve block in place. Incision and drainage performed. Moderate amt of bloody discharge with no purulent material. Swelling is decreased following I and D.  Plan to discharge home on Doxy. Return precautions provided.   Final Clinical Impression(s) / ED Diagnoses Final diagnoses:  Felon of finger    Rx / DC Orders ED Discharge Orders          Ordered    doxycycline (VIBRAMYCIN) 100 MG  capsule  2 times daily        11/26/20 1942             Sheila Oats 11/26/20 1947    Fredia Sorrow, MD 11/29/20 [phone]

## 2020-11-26 NOTE — Discharge Instructions (Addendum)
Please take all of your antibiotics until finished!   You may develop abdominal discomfort or diarrhea from the antibiotic.  You may help offset this with probiotics which you can buy or get in yogurt. Do not eat  or take the probiotics until 2 hours after your antibiotic.   Return with worsening symptoms such as fevers or worsening swelling and pain of the affected finger

## 2020-11-26 NOTE — ED Triage Notes (Signed)
Pt c/o possible infection to right middle finger from infected nailbed.

## 2020-11-29 ENCOUNTER — Ambulatory Visit: Payer: Medicare HMO | Admitting: Physical Therapy

## 2020-11-30 ENCOUNTER — Other Ambulatory Visit: Payer: Self-pay | Admitting: Family Medicine

## 2020-12-04 ENCOUNTER — Other Ambulatory Visit: Payer: Self-pay

## 2020-12-04 ENCOUNTER — Ambulatory Visit: Payer: Medicare HMO | Admitting: Physical Therapy

## 2020-12-04 DIAGNOSIS — M545 Low back pain, unspecified: Secondary | ICD-10-CM | POA: Diagnosis not present

## 2020-12-04 DIAGNOSIS — R262 Difficulty in walking, not elsewhere classified: Secondary | ICD-10-CM

## 2020-12-04 DIAGNOSIS — R296 Repeated falls: Secondary | ICD-10-CM | POA: Diagnosis not present

## 2020-12-04 DIAGNOSIS — R600 Localized edema: Secondary | ICD-10-CM | POA: Diagnosis not present

## 2020-12-04 DIAGNOSIS — M25662 Stiffness of left knee, not elsewhere classified: Secondary | ICD-10-CM

## 2020-12-04 DIAGNOSIS — M25562 Pain in left knee: Secondary | ICD-10-CM | POA: Diagnosis not present

## 2020-12-04 NOTE — Therapy (Signed)
[facility] [place], [place], [address] Phone: [phone]   Fax:  [phone]  Physical Therapy Treatment  Patient Details  Name: Wanda Oneill MRN: [id number] Date of Birth: 1980-09-28 Referring Provider (PT): Copland   Encounter Date: 12/04/2020   PT End of Session - 12/04/20 1614     Visit Number 28    Number of Visits 33    Date for PT Re-Evaluation 12/24/20    Authorization Type [employer]    PT Start Time 2751    PT Stop Time 1628    PT Time Calculation (min) 57 min    Activity Tolerance Patient tolerated treatment well    Behavior During Therapy WFL for tasks assessed/performed             Past Medical History:  Diagnosis Date   Diabetes mellitus    type II   Down syndrome    Family history of adverse reaction to anesthesia    mom has had n/v   Gastritis    Headache    Hepatic steatosis    Hidradenitis    Hypothyroidism    Irritable bowel syndrome    Periumbilical hernia    Pneumonia 03/2020   frequent    Restless legs    Tendonitis    chronic in left foot   Thyroid disease    hypothyroidism    Past Surgical History:  Procedure Laterality Date   AXILLARY HIDRADENITIS EXCISION Bilateral    CHOLECYSTECTOMY     HERNIA REPAIR     multiple   INCISIONAL HERNIA REPAIR  05/30/2015   LAPROSCOPIC   INCISIONAL HERNIA REPAIR N/A 05/30/2015   Procedure: LAPAROSCOPIC INCISIONAL HERNIA;  Surgeon: Rolm Bookbinder, MD;  Location: [facility];  Service: General;  Laterality: N/A;   INSERTION OF MESH N/A 05/30/2015   Procedure: INSERTION OF MESH;  Surgeon: Rolm Bookbinder, MD;  Location: [facility];  Service: General;  Laterality: N/A;   KNEE ARTHROSCOPY     right knee   LAPAROSCOPY N/A 05/30/2015   Procedure: LAPAROSCOPY DIAGNOSTIC;  Surgeon: Rolm Bookbinder, MD;  Location: [facility];  Service: General;  Laterality: N/A;   PARTIAL KNEE ARTHROPLASTY Right 10/30/2017   Procedure: UNICOMPARTMENTAL RIGHT KNEE  LATERAL;  Surgeon: Paralee Cancel, MD;  Location: [facility];  Service: Orthopedics;  Laterality: Right;  90 mins   PARTIAL KNEE ARTHROPLASTY Left 06/29/2020   Procedure: UNICOMPARTMENTAL KNEE LATERALLY;  Surgeon: Paralee Cancel, MD;  Location: [facility];  Service: Orthopedics;  Laterality: Left;  70 MINS   TONSILLECTOMY     adenoids    There were no vitals filed for this visit.   Subjective Assessment - 12/04/20 1546     Subjective Still sore and a little scared    Currently in Pain? Yes    Pain Score 4     Pain Location Knee    Pain Orientation Left    Pain Descriptors / Indicators Sore    Aggravating Factors  steps, curbs                               [facility] Adult PT Treatment/Exercise - 12/04/20 0001       Ambulation/Gait   Gait Comments stairs up and down 4" and 6", practiced walking wihtout device started out with CGA and tried to progress to no device and supervision      Knee/Hip Exercises: Machines for Strengthening   Cybex Knee  Extension 5# 2x10 cues for TKE    Cybex Knee Flexion 20# 2x10    Cybex Leg Press 20# 15 x BIL- cued for full TKE      Knee/Hip Exercises: Seated   Other Seated Knee/Hip Exercises fitter 1 blue band      Knee/Hip Exercises: Supine   Short Arc Quad Sets 3 sets;10 reps    Short Arc Quad Sets Limitations 3#    Bridges 2 sets;10 reps      Vasopneumatic   Number Minutes Vasopneumatic  10 minutes    Vasopnuematic Location  Knee    Vasopneumatic Pressure Medium    Vasopneumatic Temperature  38                       PT Short Term Goals - 07/13/20 1753       PT SHORT TERM GOAL #1   Title independent with initial HEP    Status Achieved               PT Long Term Goals - 12/04/20 1640       PT LONG TERM GOAL #2   Title dcrease pain 50%    Status Partially Met      PT LONG TERM GOAL #3   Title increase left knee AROM to 0-120 degrees flexion    Status Partially Met                   Plan -  12/04/20 1614     Clinical Impression Statement Patient continues to limp and want to hold furniture and walls, when asked she denies pain but reports fear, requires heavy HHA/CGA.  At first and then try to progress ti supervision only, she can do this and once going she moves well.  I just can't get her to move naturally until later in the treatment session and it seems to be a mental thing about fear as she has had numerous falls.  I really focused more on the quad strength today    PT Next Visit Plan really need to work on the strength, coordination and control of the left knee and LE    Consulted and Agree with Plan of Care Patient             Patient will benefit from skilled therapeutic intervention in order to improve the following deficits and impairments:  Abnormal gait, Decreased range of motion, Difficulty walking, Decreased activity tolerance, Pain, Impaired flexibility, Decreased balance, Decreased mobility, Decreased strength, Increased edema, Decreased scar mobility, Postural dysfunction, Increased muscle spasms  Visit Diagnosis: Difficulty in walking, not elsewhere classified  Localized edema  Acute pain of left knee  Acute bilateral low back pain without sciatica  Knee stiff, left  Repeated falls     Problem List Patient Active Problem List   Diagnosis Date Noted   S/P left unicompartmental knee replacement, lateral 06/29/2020   At risk for obstructive sleep apnea 06/11/2019   Cough 06/11/2019   Multifocal pneumonia    Acute respiratory failure with hypoxia ([place]) 05/26/2019   Hyperglycemia 05/26/2019   Acute respiratory failure with hypoxemia ([place]) 05/23/2019   Acute hypoxemic respiratory failure ([place]) 05/20/2019   Fibromyalgia 03/09/2019   Neck pain, chronic 03/09/2019   Acute shoulder bursitis, right 02/03/2019   Bile salt-induced diarrhea 09/25/2018   Arthritis of left acromioclavicular joint 09/23/2018   Precordial chest pain 08/11/2018   Morbid  obesity ([place]) 10/31/2017   S/P right UKR, lateral 10/30/2017  Incarcerated epigastric hernia 05/30/2015   Left foot pain 08/09/2014   Chronic diarrhea 07/27/2014   Gout 08/10/2012   Acid reflux 01/22/2012   Down syndrome 11/11/2011   Hernia of abdominal wall 10/21/2011   Psoriasis 10/21/2011   Type 2 diabetes mellitus, uncontrolled 04/12/2011   Hypothyroid 04/12/2011   Gait abnormality 06/22/2010   Tibial tendinitis, posterior 06/22/2010    Sumner Boast, PT 12/04/2020, 4:41 PM  [facility]. [place], [place], [address] Phone: [phone]   Fax:  [phone]  Name: Wanda Oneill MRN: [id number] Date of Birth: 28-Feb-1980

## 2020-12-11 ENCOUNTER — Ambulatory Visit: Payer: Medicare HMO | Attending: Orthopedic Surgery | Admitting: Physical Therapy

## 2020-12-11 ENCOUNTER — Other Ambulatory Visit: Payer: Self-pay

## 2020-12-11 ENCOUNTER — Encounter: Payer: Self-pay | Admitting: Physical Therapy

## 2020-12-11 DIAGNOSIS — R296 Repeated falls: Secondary | ICD-10-CM | POA: Insufficient documentation

## 2020-12-11 DIAGNOSIS — M545 Low back pain, unspecified: Secondary | ICD-10-CM | POA: Diagnosis not present

## 2020-12-11 DIAGNOSIS — M25662 Stiffness of left knee, not elsewhere classified: Secondary | ICD-10-CM | POA: Diagnosis not present

## 2020-12-11 DIAGNOSIS — M25562 Pain in left knee: Secondary | ICD-10-CM | POA: Insufficient documentation

## 2020-12-11 DIAGNOSIS — R262 Difficulty in walking, not elsewhere classified: Secondary | ICD-10-CM | POA: Diagnosis not present

## 2020-12-11 DIAGNOSIS — R600 Localized edema: Secondary | ICD-10-CM | POA: Insufficient documentation

## 2020-12-11 NOTE — Therapy (Signed)
[place]. [place], [place], [address] Phone: [phone]   Fax:  [phone]  Physical Therapy Treatment  Patient Details  Name: Wanda Oneill MRN: [id number] Date of Birth: 1980/11/18 Referring Provider (PT): Copland   Encounter Date: 12/11/2020   PT End of Session - 12/11/20 1647     Visit Number 29    Number of Visits 33    Date for PT Re-Evaluation 12/24/20    Authorization Type [employer]    PT Start Time 0277    PT Stop Time 1703    PT Time Calculation (min) 49 min    Activity Tolerance Patient tolerated treatment well    Behavior During Therapy WFL for tasks assessed/performed             Past Medical History:  Diagnosis Date   Diabetes mellitus    type II   Down syndrome    Family history of adverse reaction to anesthesia    mom has had n/v   Gastritis    Headache    Hepatic steatosis    Hidradenitis    Hypothyroidism    Irritable bowel syndrome    Periumbilical hernia    Pneumonia 03/2020   frequent    Restless legs    Tendonitis    chronic in left foot   Thyroid disease    hypothyroidism    Past Surgical History:  Procedure Laterality Date   AXILLARY HIDRADENITIS EXCISION Bilateral    CHOLECYSTECTOMY     HERNIA REPAIR     multiple   INCISIONAL HERNIA REPAIR  05/30/2015   LAPROSCOPIC   INCISIONAL HERNIA REPAIR N/A 05/30/2015   Procedure: LAPAROSCOPIC INCISIONAL HERNIA;  Surgeon: Rolm Bookbinder, MD;  Location: [facility];  Service: General;  Laterality: N/A;   INSERTION OF MESH N/A 05/30/2015   Procedure: INSERTION OF MESH;  Surgeon: Rolm Bookbinder, MD;  Location: [facility];  Service: General;  Laterality: N/A;   KNEE ARTHROSCOPY     right knee   LAPAROSCOPY N/A 05/30/2015   Procedure: LAPAROSCOPY DIAGNOSTIC;  Surgeon: Rolm Bookbinder, MD;  Location: [facility];  Service: General;  Laterality: N/A;   PARTIAL KNEE ARTHROPLASTY Right 10/30/2017   Procedure: UNICOMPARTMENTAL RIGHT KNEE  LATERAL;  Surgeon: Paralee Cancel, MD;  Location[facility];  Service: Orthopedics;  Laterality: Right;  90 mins   PARTIAL KNEE ARTHROPLASTY Left 06/29/2020   Procedure: UNICOMPARTMENTAL KNEE LATERALLY;  Surgeon: Paralee Cancel, MD;  Location: [facility];  Service: Orthopedics;  Laterality: Left;  70 MINS   TONSILLECTOMY     adenoids    There were no vitals filed for this visit.   Subjective Assessment - 12/11/20 1619     Subjective Doing okay, no falls yet, I am nervous when walking outside    Currently in Pain? Yes    Pain Score 3     Pain Location Knee    Pain Orientation Left    Aggravating Factors  walking    Multiple Pain Sites Yes                               [facility] Adult PT Treatment/Exercise - 12/11/20 0001       Ambulation/Gait   Gait Comments gait outside with spc and SBA, out back door, curb, leaves and around to the front door      High Level Balance   High Level Balance Activities Side stepping;Backward walking;Negotitating around  obstacles;Direction changes      Knee/Hip Exercises: Aerobic   Recumbent Bike 5 min L 1 she struggled on this today with the ROM    Nustep Level 5 x 6 minutes, slower pace LE only      Knee/Hip Exercises: Machines for Strengthening   Other Machine 5# straight arm pulls for core activation      Knee/Hip Exercises: Supine   Short Arc Quad Sets 3 sets;10 reps    Short Arc Quad Sets Limitations 5#                       PT Short Term Goals - 07/13/20 1753       PT SHORT TERM GOAL #1   Title independent with initial HEP    Status Achieved               PT Long Term Goals - 12/04/20 1640       PT LONG TERM GOAL #2   Title dcrease pain 50%    Status Partially Met      PT LONG TERM GOAL #3   Title increase left knee AROM to 0-120 degrees flexion    Status Partially Met                   Plan - 12/11/20 1648     Clinical Impression Statement Patient reports that she has a hard time  thinking that she will not fall, she reports fear of falling and that she is very careful, she is so cautious that this limits her ability to walk and move naturally, however she has had numerous falls and I understand this.  I may want to start a total body strength due to when she lies down she asks for help to get up and if I say she can do it, she really struggles    PT Next Visit Plan really need to work on the strength, coordination and control of the left knee and LE, work on total body strength for better function    Consulted and Agree with Plan of Care Patient             Patient will benefit from skilled therapeutic intervention in order to improve the following deficits and impairments:  Abnormal gait, Decreased range of motion, Difficulty walking, Decreased activity tolerance, Pain, Impaired flexibility, Decreased balance, Decreased mobility, Decreased strength, Increased edema, Decreased scar mobility, Postural dysfunction, Increased muscle spasms  Visit Diagnosis: Difficulty in walking, not elsewhere classified  Localized edema  Acute pain of left knee  Acute bilateral low back pain without sciatica  Knee stiff, left  Repeated falls     Problem List Patient Active Problem List   Diagnosis Date Noted   S/P left unicompartmental knee replacement, lateral 06/29/2020   At risk for obstructive sleep apnea 06/11/2019   Cough 06/11/2019   Multifocal pneumonia    Acute respiratory failure with hypoxia ([facility]) 05/26/2019   Hyperglycemia 05/26/2019   Acute respiratory failure with hypoxemia ([facility]) 05/23/2019   Acute hypoxemic respiratory failure ([facility]) 05/20/2019   Fibromyalgia 03/09/2019   Neck pain, chronic 03/09/2019   Acute shoulder bursitis, right 02/03/2019   Bile salt-induced diarrhea 09/25/2018   Arthritis of left acromioclavicular joint 09/23/2018   Precordial chest pain 08/11/2018   Morbid obesity ([facility]) 10/31/2017   S/P right UKR, lateral 10/30/2017    Incarcerated epigastric hernia 05/30/2015   Left foot pain 08/09/2014   Chronic diarrhea 07/27/2014   Gout 08/10/2012  Acid reflux 01/22/2012   Down syndrome 11/11/2011   Hernia of abdominal wall 10/21/2011   Psoriasis 10/21/2011   Type 2 diabetes mellitus, uncontrolled 04/12/2011   Hypothyroid 04/12/2011   Gait abnormality 06/22/2010   Tibial tendinitis, posterior 06/22/2010    Sumner Boast, PT 12/11/2020, 4:52 PM  [facility]. [place], [place], [address] Phone: [phone]   Fax:  [phone]  Name: Wanda Oneill MRN: [id number] Date of Birth: 1980/09/27

## 2020-12-14 NOTE — Progress Notes (Addendum)
[name] at [facility] [address] [place], [place] [address] [phone]  Date:  12/20/2020   Name:  Wanda Oneill   DOB:  06-Jul-1980   MRN:  [id number]  PCP:  Darreld Mclean, MD    Chief Complaint: ingection in the finger (Right middle- seen in the ER on 11/26/20- infection continues to worsen. Pt has some soreness, and is noticing some drainage. )   History of Present Illness:  Wanda Oneill is a 40 y.o. very pleasant female patient who presents with the following:  Wanda Oneill is a charming woman with Down syndrome, daughter of my former partner Dr. Ruben Reason History of diabetes, hypothyroidism, obesity, Down syndrome and several associated MSK problems  Wanda Oneill is seen today with concern of a finger infection Most recent visit with myself was in August  She was seen in the ER on October 23 with 1 week of worsening right long finger redness and swelling as well as an ulcer on the fingertip She underwent I&D-started on doxycycline.  Her mother who is with her today states that mostly bloody material was produced in the I&D, no culture was done She finished up the doxycycline.  However, they are concerned that her finger is still infected- it is tender on the palmar aspect and there is still some redness and swelling She is otherwise feeling pretty good; no fever or chills, no flulike symptoms Her knee is doing better but she is concerned about falling so she is more cautious   Eye exam- this is scheduled  Would suggest giving pneumonia vaccine- pt reports she had one dose in the past but had itching  Flu shot - done  Due for foot exam  She sees Dr Chalmers Cater for her DM- she will see Dr Chalmers Cater in a couple of weeks for next visit, I can fax labs  Lab Results  Component Value Date   HGBA1C 9.1 (H) 06/23/2020    Patient Active Problem List   Diagnosis Date Noted   S/P left unicompartmental knee replacement, lateral  06/29/2020   At risk for obstructive sleep apnea 06/11/2019   Cough 06/11/2019   Hyperglycemia 05/26/2019   Acute hypoxemic respiratory failure ([facility] [place]) 05/20/2019   Fibromyalgia 03/09/2019   Neck pain, chronic 03/09/2019   Acute shoulder bursitis, right 02/03/2019   Bile salt-induced diarrhea 09/25/2018   Arthritis of left acromioclavicular joint 09/23/2018   Precordial chest pain 08/11/2018   Morbid obesity ([facility]) 10/31/2017   S/P right UKR, lateral 10/30/2017   Incarcerated epigastric hernia 05/30/2015   Left foot pain 08/09/2014   Chronic diarrhea 07/27/2014   Gout 08/10/2012   Acid reflux 01/22/2012   Down syndrome 11/11/2011   Hernia of abdominal wall 10/21/2011   Psoriasis 10/21/2011   Type 2 diabetes mellitus, uncontrolled 04/12/2011   Hypothyroid 04/12/2011   Gait abnormality 06/22/2010   Tibial tendinitis, posterior 06/22/2010    Past Medical History:  Diagnosis Date   Diabetes mellitus    type II   Down syndrome    Family history of adverse reaction to anesthesia    mom has had n/v   Gastritis    Headache    Hepatic steatosis    Hidradenitis    Hypothyroidism    Irritable bowel syndrome    Periumbilical hernia    Pneumonia 03/2020   frequent    Restless legs    Tendonitis    chronic in left foot   Thyroid  disease    hypothyroidism    Past Surgical History:  Procedure Laterality Date   AXILLARY HIDRADENITIS EXCISION Bilateral    CHOLECYSTECTOMY     HERNIA REPAIR     multiple   INCISIONAL HERNIA REPAIR  05/30/2015   LAPROSCOPIC   INCISIONAL HERNIA REPAIR N/A 05/30/2015   Procedure: LAPAROSCOPIC INCISIONAL HERNIA;  Surgeon: Rolm Bookbinder, MD;  Location: [facility];  Service: General;  Laterality: N/A;   INSERTION OF MESH N/A 05/30/2015   Procedure: INSERTION OF MESH;  Surgeon: Rolm Bookbinder, MD;  Location: [facility];  Service: General;  Laterality: N/A;   KNEE ARTHROSCOPY     right knee   LAPAROSCOPY N/A 05/30/2015   Procedure: LAPAROSCOPY DIAGNOSTIC;   Surgeon: Rolm Bookbinder, MD;  Location: [facility];  Service: General;  Laterality: N/A;   PARTIAL KNEE ARTHROPLASTY Right 10/30/2017   Procedure: UNICOMPARTMENTAL RIGHT KNEE LATERAL;  Surgeon: Paralee Cancel, MD;  [facility];  Service: Orthopedics;  Laterality: Right;  90 mins   PARTIAL KNEE ARTHROPLASTY Left 06/29/2020   Procedure: UNICOMPARTMENTAL KNEE LATERALLY;  Surgeon: Paralee Cancel, MD;  [facility];  Service: Orthopedics;  Laterality: Left;  70 MINS   TONSILLECTOMY     adenoids    Social History   Tobacco Use   Smoking status: Never   Smokeless tobacco: Never  Vaping Use   Vaping Use: Never used  Substance Use Topics   Alcohol use: No    Alcohol/week: 0.0 standard drinks   Drug use: No    Family History  Problem Relation Age of Onset   Thyroid cancer Mother    Diabetes type II Mother    High Cholesterol Mother    Heart disease Mother    Diabetes type II Father    Hypertension Father    Stroke Father     Allergies  Allergen Reactions   Vancomycin Itching   Cefuroxime Axetil Hives   Sulfa Antibiotics Hives    Medication list has been reviewed and updated.  Current Outpatient Medications on File Prior to Visit  Medication Sig Dispense Refill   acetaminophen (TYLENOL) 325 MG tablet Take 650 mg by mouth every 6 (six) hours as needed for mild pain.      allopurinol (ZYLOPRIM) 300 MG tablet Take 1 tablet by mouth once daily 90 tablet 1   Blood Glucose Monitoring Suppl (BLOOD GLUCOSE METER KIT AND SUPPLIES) Dispense based on patient and insurance preference. To check blood sugars daily FOR ICD-9 250.00 (Patient taking differently: 1 each by Other route See admin instructions. Dispense based on patient and insurance preference. To check blood sugars daily FOR ICD-9 250.00) 1 each 0   Blood Glucose Monitoring Suppl (TRUE METRIX METER) w/Device KIT Use as directed to check glucose up to two times daily. Dx Code E11.9 1 kit 0   cholestyramine (QUESTRAN) 4 g packet  DISSOLVE & TAKE 1 PACKET OF POWDER BY MOUTH TWICE DAILY (Patient taking differently: Take 4 g by mouth 2 (two) times daily.) 180 each 3   dicyclomine (BENTYL) 20 MG tablet Take 20 mg by mouth daily as needed for spasms.     empagliflozin (JARDIANCE) 25 MG TABS tablet Take 25 mg by mouth daily.     Glucose Blood (BLOOD GLUCOSE TEST STRIPS) STRP 1 each by Other route See admin instructions. Use to test blood sugar up to twice a day 100 strip 3   glucose blood (FREESTYLE TEST STRIPS) test strip Use as directed to test blood sugar up to twice a  day. Dx code E11.9 100 each 12   glucose blood (TRUE METRIX BLOOD GLUCOSE TEST) test strip Use as instructed to check glucose up to 2 times daily. Dx Code E11.9 100 each 12   HYDROcodone-acetaminophen (NORCO/VICODIN) 5-325 MG tablet Take 1-2 tablets by mouth every 6 (six) hours as needed for severe pain. 42 tablet 0   levonorgestrel-ethinyl estradiol (JOLESSA) 0.15-0.03 MG tablet Take 1 tablet by mouth once daily 84 tablet 3   levothyroxine (SYNTHROID) 137 MCG tablet TAKE 1 TABLET BY MOUTH ONCE DAILY BEFORE BREAKFAST 90 tablet 1   metFORMIN (GLUCOPHAGE) 1000 MG tablet Take 1,000 mg by mouth 2 (two) times daily.   2   methocarbamol (ROBAXIN) 500 MG tablet Take 1 tablet (500 mg total) by mouth every 6 (six) hours as needed for muscle spasms. 40 tablet 0   ONE TOUCH ULTRA TEST test strip USE TO CHECK BLOOD SUGAR ONCE DAILY (ICD CODE:25.00) (Patient taking differently: 1 each by Other route daily.) 100 each 3   TRUEplus Lancets 30G MISC Use as directed to check glucose up to two times daily. Dx code E11.9 532 each 3   TRULICITY 1.5 DJ/2.4QA SOPN Inject 1.5 mg into the skin every Saturday.     VASCEPA 1 g capsule Take 2 g by mouth 2 (two) times daily.     No current facility-administered medications on file prior to visit.    Review of Systems:  As per HPI- otherwise negative.   Physical Examination: Vitals:   12/20/20 0925  BP: 124/80  Pulse: 80  Resp: 20   Temp: 98.3 F (36.8 C)  SpO2: 100%   Vitals:   12/20/20 0925  Weight: 235 lb 12.8 oz (107 kg)  Height: $Remove'5\' 4"'KgrgdnC$  (1.626 m)   Body mass index is 40.47 kg/m. Ideal Body Weight: Weight in (lb) to have BMI = 25: 145.3  GEN: no acute distress. Looks well and her normal self HEENT: Atraumatic, Normocephalic.  Ears and Nose: No external deformity. CV: RRR, No M/G/R. No JVD. No thrill. No extra heart sounds. PULM: CTA B, no wheezes, crackles, rhonchi. No retractions. No resp. distress. No accessory muscle use. ABD: S, NT, ND, +BS. No rebound. No HSM. EXTR: No c/c/e PSYCH: Normally interactive. Conversant.  Right long finger displays mild erythema and slight swelling.  The blistered lesion on the tip of the finger is healing, but slowly.  It is quite hard and callused in appearance Range of motion is normal Mild tenderness of the distal phalanx of the fingers present Foot exam completed today Assessment and Plan: Finger infection - Plan: amoxicillin (AMOXIL) 875 MG tablet  Controlled type 2 diabetes mellitus with complication, without long-term current use of insulin (HCC) - Plan: Hemoglobin A1c, Comprehensive metabolic panel  Dyslipidemia - Plan: Lipid panel  Acquired hypothyroidism - Plan: TSH  Patient seen today for follow-up of right long finger infection.  As above, she was seen in the ER and treated with doxycycline recently.  However, because doxycycline does upset her stomach she took it once a day instead of twice a day for part of the course of treatment  We decided to try a slightly longer course of a different antibiotic.  Dae does have history of allergy to Septra and cephalosporin-both noted in 2012  She took Augmentin in 2015 without any adverse effects, I spoke with her father Dr. Ruben Reason on the telephone who agrees that per our knowledge Shylie can tolerate penicillins  Amoxicillin 875 twice daily for up to 14  days.  If any sign of allergic reaction stop using  contact me right away.  If not showing significant improvement by Monday of next week we will refer her to hand surgery  Routine labs done as above, fax to Dr. Chalmers Cater   Signed Lamar Blinks, MD  Received her labs as below, called pt mother. We will adjust her thyroid from 137- 150 mcg A1c is improved although still a bit high They are seeing her endocrinologist in a few weeks   Results for orders placed or performed in visit on 12/20/20  Hemoglobin A1c  Result Value Ref Range   Hgb A1c MFr Bld 8.5 (H) 4.6 - 6.5 %  Comprehensive metabolic panel  Result Value Ref Range   Sodium 136 135 - 145 mEq/L   Potassium 4.6 3.5 - 5.1 mEq/L   Chloride 98 96 - 112 mEq/L   CO2 27 19 - 32 mEq/L   Glucose, Bld 169 (H) 70 - 99 mg/dL   BUN 12 6 - 23 mg/dL   Creatinine, Ser 0.60 0.40 - 1.20 mg/dL   Total Bilirubin 0.3 0.2 - 1.2 mg/dL   Alkaline Phosphatase 41 39 - 117 U/L   AST 17 0 - 37 U/L   ALT 21 0 - 35 U/L   Total Protein 7.0 6.0 - 8.3 g/dL   Albumin 3.7 3.5 - 5.2 g/dL   GFR 111.81 >60.00 mL/min   Calcium 9.3 8.4 - 10.5 mg/dL  Lipid panel  Result Value Ref Range   Cholesterol 166 0 - 200 mg/dL   Triglycerides 385.0 (H) 0.0 - 149.0 mg/dL   HDL 50.00 >39.00 mg/dL   VLDL 77.0 (H) 0.0 - 40.0 mg/dL   Total CHOL/HDL Ratio 3    NonHDL 115.71   TSH  Result Value Ref Range   TSH 10.09 (H) 0.35 - 5.50 uIU/mL  LDL cholesterol, direct  Result Value Ref Range   Direct LDL 75.0 mg/dL

## 2020-12-19 ENCOUNTER — Encounter: Payer: Self-pay | Admitting: Physical Therapy

## 2020-12-19 ENCOUNTER — Ambulatory Visit: Payer: Medicare HMO | Admitting: Physical Therapy

## 2020-12-19 ENCOUNTER — Other Ambulatory Visit: Payer: Self-pay

## 2020-12-19 DIAGNOSIS — R296 Repeated falls: Secondary | ICD-10-CM

## 2020-12-19 DIAGNOSIS — R262 Difficulty in walking, not elsewhere classified: Secondary | ICD-10-CM

## 2020-12-19 DIAGNOSIS — R600 Localized edema: Secondary | ICD-10-CM

## 2020-12-19 DIAGNOSIS — M25562 Pain in left knee: Secondary | ICD-10-CM

## 2020-12-19 DIAGNOSIS — M25662 Stiffness of left knee, not elsewhere classified: Secondary | ICD-10-CM | POA: Diagnosis not present

## 2020-12-19 DIAGNOSIS — M545 Low back pain, unspecified: Secondary | ICD-10-CM | POA: Diagnosis not present

## 2020-12-19 NOTE — Therapy (Signed)
[place]. [place], [place], [address] Phone: [phone]   Fax:  [phone]  Physical Therapy Treatment  Patient Details  Name: Wanda Oneill MRN: [id number] Date of Birth: [birth year] Referring Provider (PT): Copland   Encounter Date: 12/19/2020   PT End of Session - 12/19/20 1705     Visit Number 30    Date for PT Re-Evaluation 12/24/20    Authorization Type [employer]    PT Start Time 1528    PT Stop Time 1612    PT Time Calculation (min) 44 min    Activity Tolerance Patient tolerated treatment well    Behavior During Therapy [facility] for tasks assessed/performed             Past Medical History:  Diagnosis Date   Diabetes mellitus    type II   Down syndrome    Family history of adverse reaction to anesthesia    mom has had n/v   Gastritis    Headache    Hepatic steatosis    Hidradenitis    Hypothyroidism    Irritable bowel syndrome    Periumbilical hernia    Pneumonia 03/2020   frequent    Restless legs    Tendonitis    chronic in left foot   Thyroid disease    hypothyroidism    Past Surgical History:  Procedure Laterality Date   AXILLARY HIDRADENITIS EXCISION Bilateral    CHOLECYSTECTOMY     HERNIA REPAIR     multiple   INCISIONAL HERNIA REPAIR  05/30/2015   LAPROSCOPIC   INCISIONAL HERNIA REPAIR N/A 05/30/2015   Procedure: LAPAROSCOPIC INCISIONAL HERNIA;  Surgeon: Rolm Bookbinder, MD;  Location: [facility];  Service: General;  Laterality: N/A;   INSERTION OF MESH N/A 05/30/2015   Procedure: INSERTION OF MESH;  Surgeon: Rolm Bookbinder, MD;  Location: [facility];  Service: General;  Laterality: N/A;   KNEE ARTHROSCOPY     right knee   LAPAROSCOPY N/A 05/30/2015   Procedure: LAPAROSCOPY DIAGNOSTIC;  Surgeon: Rolm Bookbinder, MD;  Location: [facility];  Service: General;  Laterality: N/A;   PARTIAL KNEE ARTHROPLASTY Right 10/30/2017   Procedure: UNICOMPARTMENTAL RIGHT KNEE LATERAL;  Surgeon: Paralee Cancel, MD;  [facility];  Service: Orthopedics;  Laterality: Right;  90 mins   PARTIAL KNEE ARTHROPLASTY Left 06/29/2020   Procedure: UNICOMPARTMENTAL KNEE LATERALLY;  Surgeon: Paralee Cancel, MD;  [facility];  Service: Orthopedics;  Laterality: Left;  70 MINS   TONSILLECTOMY     adenoids    There were no vitals filed for this visit.   Subjective Assessment - 12/19/20 1532     Subjective No falls, stiff due to the weather    Currently in Pain? Yes    Pain Score 4     Pain Location Knee    Pain Orientation Left    Pain Descriptors / Indicators Sore    Aggravating Factors  walking, weather                               [facility] Adult PT Treatment/Exercise - 12/19/20 0001       Ambulation/Gait   Gait Comments stairs step over step very slow      Knee/Hip Exercises: Aerobic   Recumbent Bike 5 min L 1 she struggled on this today with the ROM      Knee/Hip Exercises: Machines for Strengthening   Cybex Knee  Extension 5# 2x10 cues for TKE    Cybex Knee Flexion 25# 2x10    Other Machine 5# straight arm pulls for core activation      Knee/Hip Exercises: Seated   Other Seated Knee/Hip Exercises fitter 1 blue band    Other Seated Knee/Hip Exercises sit to stand 5 x no UE help with some      Knee/Hip Exercises: Supine   Short Arc Quad Sets 3 sets;10 reps    Short Arc Quad Sets Limitations 5#                       PT Short Term Goals - 07/13/20 1753       PT SHORT TERM GOAL #1   Title independent with initial HEP    Status Achieved               PT Long Term Goals - 12/19/20 1708       PT LONG TERM GOAL #1   Title understand RICE    Status Achieved      PT LONG TERM GOAL #2   Title dcrease pain 50%    Status Achieved      PT LONG TERM GOAL #3   Title increase left knee AROM to 0-120 degrees flexion    Status Partially Met      PT LONG TERM GOAL #4   Title walk on all surfaces without limp and with minimal; pain     Status Partially Met      PT LONG TERM GOAL #5   Title decrease LBP 50%    Status Achieved                   Plan - 12/19/20 1705     Clinical Impression Statement I sat down and talked with Wanda Oneill and her mom and dad.  She is doing better overall but is still fearful and hesitant, we decided with we would stop if Wanda Oneill would take one extra walk a day in her breez[place]ay, her mom reports that it is flat and very safe.  The biggest issue is she fatigues easily and has a lot of fear after the falls that she has had.  She does have reports of buckling at times, I witnessed this once, I think it is proprioception, but again this has got in her head and she is afraid and self limits    PT Next Visit Plan D/C they may ask for an x-ray since she has had recent falls    Consulted and Agree with Plan of Care Patient             Patient will benefit from skilled therapeutic intervention in order to improve the following deficits and impairments:  Abnormal gait, Decreased range of motion, Difficulty walking, Decreased activity tolerance, Pain, Impaired flexibility, Decreased balance, Decreased mobility, Decreased strength, Increased edema, Decreased scar mobility, Postural dysfunction, Increased muscle spasms  Visit Diagnosis: Difficulty in walking, not elsewhere classified  Localized edema  Acute pain of left knee  Acute bilateral low back pain without sciatica  Knee stiff, left  Repeated falls     Problem List Patient Active Problem List   Diagnosis Date Noted   S/P left unicompartmental knee replacement, lateral 06/29/2020   At risk for obstructive sleep apnea 06/11/2019   Cough 06/11/2019   Hyperglycemia 05/26/2019   Acute hypoxemic respiratory failure ([facility]) 05/20/2019   Fibromyalgia 03/09/2019   Neck pain, chronic 03/09/2019   Acute  shoulder bursitis, right 02/03/2019   Bile salt-induced diarrhea 09/25/2018   Arthritis of left acromioclavicular joint 09/23/2018    Precordial chest pain 08/11/2018   Morbid obesity ([place]) 10/31/2017   S/P right UKR, lateral 10/30/2017   Incarcerated epigastric hernia 05/30/2015   Left foot pain 08/09/2014   Chronic diarrhea 07/27/2014   Gout 08/10/2012   Acid reflux 01/22/2012   Down syndrome 11/11/2011   Hernia of abdominal wall 10/21/2011   Psoriasis 10/21/2011   Type 2 diabetes mellitus, uncontrolled 04/12/2011   Hypothyroid 04/12/2011   Gait abnormality 06/22/2010   Tibial tendinitis, posterior 06/22/2010    Sumner Boast, PT 12/19/2020, 5:09 PM  [place]. [place], [place], [address] Phone: [phone]   Fax:  [phone]  Name: Wanda Oneill MRN: [id number] Date of Birth: Sep 01, 1980

## 2020-12-20 ENCOUNTER — Ambulatory Visit (INDEPENDENT_AMBULATORY_CARE_PROVIDER_SITE_OTHER): Payer: Medicare HMO | Admitting: Family Medicine

## 2020-12-20 VITALS — BP 124/80 | HR 80 | Temp 98.3°F | Resp 20 | Ht 64.0 in | Wt 235.8 lb

## 2020-12-20 DIAGNOSIS — E785 Hyperlipidemia, unspecified: Secondary | ICD-10-CM

## 2020-12-20 DIAGNOSIS — E039 Hypothyroidism, unspecified: Secondary | ICD-10-CM

## 2020-12-20 DIAGNOSIS — E118 Type 2 diabetes mellitus with unspecified complications: Secondary | ICD-10-CM

## 2020-12-20 DIAGNOSIS — L089 Local infection of the skin and subcutaneous tissue, unspecified: Secondary | ICD-10-CM | POA: Diagnosis not present

## 2020-12-20 LAB — LIPID PANEL
Cholesterol: 166 mg/dL (ref 0–200)
HDL: 50 mg/dL (ref >39.00)
NonHDL: 115.71
Total CHOL/HDL Ratio: 3
Triglycerides: 385 mg/dL — ABNORMAL HIGH (ref 0.0–149.0)
VLDL: 77 mg/dL — ABNORMAL HIGH (ref 0.0–40.0)

## 2020-12-20 LAB — COMPREHENSIVE METABOLIC PANEL
ALT: 21 U/L (ref 0–35)
AST: 17 U/L (ref 0–37)
Albumin: 3.7 g/dL (ref 3.5–5.2)
Alkaline Phosphatase: 41 U/L (ref 39–117)
BUN: 12 mg/dL (ref 6–23)
CO2: 27 mEq/L (ref 19–32)
Calcium: 9.3 mg/dL (ref 8.4–10.5)
Chloride: 98 mEq/L (ref 96–112)
Creatinine, Ser: 0.6 mg/dL (ref 0.40–1.20)
GFR: 111.81 mL/min (ref >60.00)
Glucose, Bld: 169 mg/dL — ABNORMAL HIGH (ref 70–99)
Potassium: 4.6 mEq/L (ref 3.5–5.1)
Sodium: 136 mEq/L (ref 135–145)
Total Bilirubin: 0.3 mg/dL (ref 0.2–1.2)
Total Protein: 7 g/dL (ref 6.0–8.3)

## 2020-12-20 LAB — HEMOGLOBIN A1C: Hgb A1c MFr Bld: 8.5 % — ABNORMAL HIGH (ref 4.6–6.5)

## 2020-12-20 LAB — TSH: TSH: 10.09 u[IU]/mL — ABNORMAL HIGH (ref 0.35–5.50)

## 2020-12-20 LAB — LDL CHOLESTEROL, DIRECT: Direct LDL: 75 mg/dL

## 2020-12-20 MED ORDER — AMOXICILLIN 875 MG PO TABS: 875.0000 mg | ORAL_TABLET | Freq: Two times a day (BID) | ORAL | 0 refills | Status: DC

## 2020-12-20 NOTE — Patient Instructions (Signed)
Good to see you again today!  I will be in touch with your labs and will fax to Dr Chalmers Cater for you  For your finger- the tip seems a bit dry. Try using some vaseline- or the vaseline gauze- under your band-aid to soften up the healing area Amoxicillin twice a day for up to 2 weeks for redness and infection If not improving by the first of next week please alert me and I will refer you to hand surgery

## 2020-12-21 ENCOUNTER — Ambulatory Visit: Payer: Medicare HMO | Admitting: Physical Therapy

## 2020-12-22 MED ORDER — LEVOTHYROXINE SODIUM 150 MCG PO TABS: 150.0000 ug | ORAL_TABLET | Freq: Every day | ORAL | 3 refills | Status: DC

## 2020-12-22 NOTE — Addendum Note (Signed)
Addended by: Lamar Blinks C on: 12/22/2020 02:53 PM   Modules accepted: Orders

## 2020-12-27 NOTE — Progress Notes (Signed)
Labs were faxed

## 2021-01-22 ENCOUNTER — Other Ambulatory Visit: Payer: Self-pay | Admitting: Family Medicine

## 2021-01-22 DIAGNOSIS — L089 Local infection of the skin and subcutaneous tissue, unspecified: Secondary | ICD-10-CM

## 2021-01-22 MED ORDER — AMOXICILLIN 875 MG PO TABS: 875.0000 mg | ORAL_TABLET | Freq: Two times a day (BID) | ORAL | 0 refills | Status: DC

## 2021-01-22 NOTE — Progress Notes (Signed)
Received a message from patient's mother that her finger had improved but then got worse again See note from 11/16; at that time we treated her with amoxicillin twice a day for 14 days  However, since finishing medication her finger has worsened again  Sent a message to her mother, offered appointment with orthopedics.  We will also have her start back on amoxicillin now

## 2021-01-22 NOTE — Addendum Note (Signed)
Addended by: Lamar Blinks C on: 01/22/2021 04:34 PM   Modules accepted: Orders

## 2021-01-24 ENCOUNTER — Other Ambulatory Visit: Payer: Self-pay | Admitting: Family Medicine

## 2021-01-25 ENCOUNTER — Encounter: Payer: Self-pay | Admitting: Orthopedic Surgery

## 2021-01-25 ENCOUNTER — Other Ambulatory Visit: Payer: Self-pay

## 2021-01-25 ENCOUNTER — Ambulatory Visit (INDEPENDENT_AMBULATORY_CARE_PROVIDER_SITE_OTHER): Payer: Medicare HMO | Admitting: Orthopedic Surgery

## 2021-01-25 DIAGNOSIS — L03011 Cellulitis of right finger: Secondary | ICD-10-CM | POA: Diagnosis not present

## 2021-01-25 DIAGNOSIS — L03019 Cellulitis of unspecified finger: Secondary | ICD-10-CM | POA: Insufficient documentation

## 2021-01-25 MED ORDER — BETAMETHASONE DIPROPIONATE 0.05 % EX CREA
TOPICAL_CREAM | Freq: Two times a day (BID) | CUTANEOUS | 0 refills | Status: AC
Start: 2021-01-25 — End: 2021-02-08

## 2021-01-25 NOTE — Progress Notes (Signed)
Office Visit Note   Patient: Wanda Oneill           Date of Birth: [birth year]           MRN: [id number] Visit Date: 01/25/2021              Requested by: Darreld Mclean, MD [address] [place],  [place] [address] PCP: Darreld Mclean, MD   Assessment & Plan: Visit Diagnoses:  1. Chronic paronychia of finger of right hand     Plan: Discussed with patient and parents that the chronic waxing and waning nature of the distal middle finger inflammation seems like a chronic paronychia.  There is no purulence or drainable fluid collection today.  We will try a topical corticosteroid for two weeks.  I can see her back in 3-4 weeks.   Follow-Up Instructions: No follow-ups on file.   Orders:  No orders of the defined types were placed in this encounter.  Meds ordered this encounter  Medications   betamethasone dipropionate 0.05 % cream    Sig: Apply topically 2 (two) times daily for 14 days. Audria Nine, MD    Dispense:  30 g    Refill:  0      Procedures: No procedures performed   Clinical Data: No additional findings.   Subjective: Chief Complaint  Patient presents with   Right Hand - New Patient (Initial Visit)    This is a 40 year old right-hand-dominant female who presents with swelling and erythema of the right middle finger.  This is a chronic issue.  This been going on for several months now.  Her parents note cycles in which the finger will become very swollen and erythematous with slight drainage and then improves.  Is never gone away completely.  Patient has no complaints today.  She denies any pain of the fingertip.  There is been no drainage for quite some time.  She underwent a attempted I&D several months ago with no purulence encountered by the ER staff.  She has been on several courses of amoxicillin but the inflammation is never gone away.   Review of Systems   Objective: Vital Signs: BP 117/78 (BP Location: Right Arm,  Patient Position: Sitting, Cuff Size: Large)    Pulse 89    Wt 235 lb (106.6 kg)    SpO2 97%    BMI 40.34 kg/m   Physical Exam Constitutional:      Appearance: Normal appearance.  Cardiovascular:     Rate and Rhythm: Normal rate.     Pulses: Normal pulses.  Pulmonary:     Effort: Pulmonary effort is normal.  Skin:    General: Skin is warm and dry.     Capillary Refill: Capillary refill takes less than 2 seconds.  Neurological:     Mental Status: She is alert.    Right Hand Exam   Tenderness  The patient is experiencing no tenderness.   Comments:  Swelling and erythema of the distal aspect of the middle finger.  There are chronic changes of the nail plate with thickening and pitting.  There is no tense swelling of the pulp.  There is no drainage.  She has no TTP at the distal phalanx.      Specialty Comments:  No specialty comments available.  Imaging: Prior x-rays of the right middle finger are reviewed and interpreted by me today.  They do not demonstrate any erosions or lucency concerning for osteomyelitis.  PMFS History: Patient Active Problem List   Diagnosis Date Noted   Chronic paronychia of finger 01/25/2021   S/P left unicompartmental knee replacement, lateral 06/29/2020   At risk for obstructive sleep apnea 06/11/2019   Cough 06/11/2019   Hyperglycemia 05/26/2019   Acute hypoxemic respiratory failure ([facility]) 05/20/2019   Fibromyalgia 03/09/2019   Neck pain, chronic 03/09/2019   Acute shoulder bursitis, right 02/03/2019   Bile salt-induced diarrhea 09/25/2018   Arthritis of left acromioclavicular joint 09/23/2018   Precordial chest pain 08/11/2018   Morbid obesity ([place]) 10/31/2017   S/P right UKR, lateral 10/30/2017   Incarcerated epigastric hernia 05/30/2015   Left foot pain 08/09/2014   Chronic diarrhea 07/27/2014   Gout 08/10/2012   Acid reflux 01/22/2012   Down syndrome 11/11/2011   Hernia of abdominal wall 10/21/2011   Psoriasis 10/21/2011    Type 2 diabetes mellitus, uncontrolled 04/12/2011   Hypothyroid 04/12/2011   Gait abnormality 06/22/2010   Tibial tendinitis, posterior 06/22/2010   Past Medical History:  Diagnosis Date   Diabetes mellitus    type II   Down syndrome    Family history of adverse reaction to anesthesia    mom has had n/v   Gastritis    Headache    Hepatic steatosis    Hidradenitis    Hypothyroidism    Irritable bowel syndrome    Periumbilical hernia    Pneumonia 03/2020   frequent    Restless legs    Tendonitis    chronic in left foot   Thyroid disease    hypothyroidism    Family History  Problem Relation Age of Onset   Thyroid cancer Mother    Diabetes type II Mother    High Cholesterol Mother    Heart disease Mother    Diabetes type II Father    Hypertension Father    Stroke Father     Past Surgical History:  Procedure Laterality Date   AXILLARY HIDRADENITIS EXCISION Bilateral    CHOLECYSTECTOMY     HERNIA REPAIR     multiple   INCISIONAL HERNIA REPAIR  05/30/2015   LAPROSCOPIC   INCISIONAL HERNIA REPAIR N/A 05/30/2015   Procedure: LAPAROSCOPIC INCISIONAL HERNIA;  Surgeon: Rolm Bookbinder, MD;  Location: [facility];  Service: General;  Laterality: N/A;   INSERTION OF MESH N/A 05/30/2015   Procedure: INSERTION OF MESH;  Surgeon: Rolm Bookbinder, MD;  Location: [facility];  Service: General;  Laterality: N/A;   KNEE ARTHROSCOPY     right knee   LAPAROSCOPY N/A 05/30/2015   Procedure: LAPAROSCOPY DIAGNOSTIC;  Surgeon: Rolm Bookbinder, MD;  Location: [facility];  Service: General;  Laterality: N/A;   PARTIAL KNEE ARTHROPLASTY Right 10/30/2017   Procedure: UNICOMPARTMENTAL RIGHT KNEE LATERAL;  Surgeon: Paralee Cancel, MD;  [facility];  Service: Orthopedics;  Laterality: Right;  90 mins   PARTIAL KNEE ARTHROPLASTY Left 06/29/2020   Procedure: UNICOMPARTMENTAL KNEE LATERALLY;  Surgeon: Paralee Cancel, MD;  [facility];  Service: Orthopedics;  Laterality: Left;  41 MINS   TONSILLECTOMY      adenoids   Social History   Occupational History   Occupation: disabled  Tobacco Use   Smoking status: Never   Smokeless tobacco: Never  Vaping Use   Vaping Use: Never used  Substance and Sexual Activity   Alcohol use: No    Alcohol/week: 0.0 standard drinks   Drug use: No   Sexual activity: Yes    Birth control/protection: Pill

## 2021-01-30 DIAGNOSIS — E119 Type 2 diabetes mellitus without complications: Secondary | ICD-10-CM | POA: Diagnosis not present

## 2021-02-20 DIAGNOSIS — E039 Hypothyroidism, unspecified: Secondary | ICD-10-CM | POA: Diagnosis not present

## 2021-02-20 DIAGNOSIS — E781 Pure hyperglyceridemia: Secondary | ICD-10-CM | POA: Diagnosis not present

## 2021-02-20 DIAGNOSIS — E010 Iodine-deficiency related diffuse (endemic) goiter: Secondary | ICD-10-CM | POA: Diagnosis not present

## 2021-02-20 DIAGNOSIS — E1165 Type 2 diabetes mellitus with hyperglycemia: Secondary | ICD-10-CM | POA: Diagnosis not present

## 2021-02-24 ENCOUNTER — Other Ambulatory Visit: Payer: Self-pay | Admitting: Family Medicine

## 2021-02-26 NOTE — Telephone Encounter (Signed)
Patient is requesting a refill of the following medications: Requested Prescriptions   Pending Prescriptions Disp Refills   cholestyramine (QUESTRAN) 4 g packet [Pharmacy Med Name: Cholestyramine 4 GM Oral Packet] 180 each 0    Sig: DISSOLVE AND TAKE 1 PACKET OF POWDER BY MOUTH TWICE DAILY    Date of patient request: 02/26/21 Last office visit: 12/20/20 for finger infection Date of last refill: 01/05/20  Last refill amount: 180+3 Follow up time period per chart: none scheduled

## 2021-04-04 DIAGNOSIS — Z96652 Presence of left artificial knee joint: Secondary | ICD-10-CM | POA: Diagnosis not present

## 2021-04-19 ENCOUNTER — Ambulatory Visit (INDEPENDENT_AMBULATORY_CARE_PROVIDER_SITE_OTHER): Payer: Medicare HMO | Admitting: Orthopedic Surgery

## 2021-04-19 ENCOUNTER — Other Ambulatory Visit: Payer: Self-pay

## 2021-04-19 DIAGNOSIS — L03019 Cellulitis of unspecified finger: Secondary | ICD-10-CM

## 2021-04-19 NOTE — Progress Notes (Signed)
? ?Office Visit Note ?  ?Patient: Wanda Oneill           ?Date of Birth: February 14, 1980           ?MRN: [id number] ?Visit Date: 04/19/2021 ?             ?Requested by: Darreld Mclean, MD ?[address] ?[place],  [place] [address] ?PCP: Darreld Mclean, MD ? ? ?Assessment & Plan: ?Visit Diagnoses:  ?1. Chronic paronychia of finger, unspecified laterality   ? ? ?Plan: Patient returns today with chronic paronychia involving the right middle finger for which I saw her back in the December but now with similar findings in the left middle finger.  She tried a topical steroid cream which resolved her symptoms on the right side.  She does not want to try another topical steroid so we will try topical gentian violet on both middle fingers.  Discussed painting the proximal nail fold and around the nail once or twice per day.  I can see her back in a month if she still having symptoms. ? ?Follow-Up Instructions: No follow-ups on file.  ? ?Orders:  ?No orders of the defined types were placed in this encounter. ? ?No orders of the defined types were placed in this encounter. ? ? ? ? Procedures: ?No procedures performed ? ? ?Clinical Data: ?No additional findings. ? ? ?Subjective: ?Chief Complaint  ?Patient presents with  ? Right Middle Finger - Follow-up  ? ? ?This is a 41 year old female who presents for follow-up of a right chronic paronychia and a new onset left chronic paronychia.  I saw her last in December at which time we tried topical corticosteroid on the right middle finger.  This completely cleared up her symptoms.  The left middle finger started becoming red and inflamed about a month and a half or so ago.  The right middle finger symptoms return around same time.  She describes erythema at the proximal nail fold with swelling and changes of the nail plate.  There is no pain.  There is no drainage. ? ? ?Review of Systems ? ? ?Objective: ?Vital Signs: There were no vitals taken for this  visit. ? ?Physical Exam ?Constitutional:   ?   Appearance: Normal appearance.  ?Cardiovascular:  ?   Rate and Rhythm: Normal rate.  ?   Pulses: Normal pulses.  ?Pulmonary:  ?   Effort: Pulmonary effort is normal.  ?Skin: ?   General: Skin is warm and dry.  ?   Capillary Refill: Capillary refill takes less than 2 seconds.  ?Neurological:  ?   Mental Status: She is alert.  ? ? ?Right Hand Exam  ? ?Tenderness  ?The patient is experiencing no tenderness.  ? ?Other  ?Sensation: normal ?Pulse: present ? ?Comments:  Swelling of middle finger proximal nail fold w/ rolled edge where meets the nail.  Pitting and ruffles of the nail plate.  Mild erythema around nail fold.  No drainage.  ? ? ?Left Hand Exam  ? ?Tenderness  ?The patient is experiencing no tenderness.  ? ?Other  ?Sensation: normal ?Pulse: present ? ?Comments:  Swelling of middle finger proximal nail fold w/ rolled edge where meets the nail.  Minimal changes of nail plate at this time but nail cut very short at distal edge.  Mild erythema around nail fold.  No drainage.  ? ? ? ? ?Specialty Comments:  ?No specialty comments available. ? ?Imaging: ?No results found. ? ? ?  PMFS History: ?Patient Active Problem List  ? Diagnosis Date Noted  ? Chronic paronychia of finger 01/25/2021  ? S/P left unicompartmental knee replacement, lateral 06/29/2020  ? At risk for obstructive sleep apnea 06/11/2019  ? Cough 06/11/2019  ? Hyperglycemia 05/26/2019  ? Acute hypoxemic respiratory failure ([facility]) 05/20/2019  ? Fibromyalgia 03/09/2019  ? Neck pain, chronic 03/09/2019  ? Acute shoulder bursitis, right 02/03/2019  ? Bile salt-induced diarrhea 09/25/2018  ? Arthritis of left acromioclavicular joint 09/23/2018  ? Precordial chest pain 08/11/2018  ? Morbid obesity ([facility]) 10/31/2017  ? S/P right UKR, lateral 10/30/2017  ? Incarcerated epigastric hernia 05/30/2015  ? Left foot pain 08/09/2014  ? Chronic diarrhea 07/27/2014  ? Gout 08/10/2012  ? Acid reflux 01/22/2012  ? Down syndrome  11/11/2011  ? Hernia of abdominal wall 10/21/2011  ? Psoriasis 10/21/2011  ? Type 2 diabetes mellitus, uncontrolled 04/12/2011  ? Hypothyroid 04/12/2011  ? Gait abnormality 06/22/2010  ? Tibial tendinitis, posterior 06/22/2010  ? ?Past Medical History:  ?Diagnosis Date  ? Diabetes mellitus   ? type II  ? Down syndrome   ? Family history of adverse reaction to anesthesia   ? mom has had n/v  ? Gastritis   ? Headache   ? Hepatic steatosis   ? Hidradenitis   ? Hypothyroidism   ? Irritable bowel syndrome   ? Periumbilical hernia   ? Pneumonia 03/2020  ? frequent   ? Restless legs   ? Tendonitis   ? chronic in left foot  ? Thyroid disease   ? hypothyroidism  ?  ?Family History  ?Problem Relation Age of Onset  ? Thyroid cancer Mother   ? Diabetes type II Mother   ? High Cholesterol Mother   ? Heart disease Mother   ? Diabetes type II Father   ? Hypertension Father   ? Stroke Father   ?  ?Past Surgical History:  ?Procedure Laterality Date  ? AXILLARY HIDRADENITIS EXCISION Bilateral   ? CHOLECYSTECTOMY    ? HERNIA REPAIR    ? multiple  ? INCISIONAL HERNIA REPAIR  05/30/2015  ? LAPROSCOPIC  ? INCISIONAL HERNIA REPAIR N/A 05/30/2015  ? Procedure: LAPAROSCOPIC INCISIONAL HERNIA;  Surgeon: Rolm Bookbinder, MD;  Location: [facility];  Service: General;  Laterality: N/A;  ? INSERTION OF MESH N/A 05/30/2015  ? Procedure: INSERTION OF MESH;  Surgeon: Rolm Bookbinder, MD;  Location: [facility];  Service: General;  Laterality: N/A;  ? KNEE ARTHROSCOPY    ? right knee  ? LAPAROSCOPY N/A 05/30/2015  ? Procedure: LAPAROSCOPY DIAGNOSTIC;  Surgeon: Rolm Bookbinder, MD;  Location: [facility];  Service: General;  Laterality: N/A;  ? PARTIAL KNEE ARTHROPLASTY Right 10/30/2017  ? Procedure: UNICOMPARTMENTAL RIGHT KNEE LATERAL;  Surgeon: Paralee Cancel, MD;  [facility];  Service: Orthopedics;  Laterality: Right;  90 mins  ? PARTIAL KNEE ARTHROPLASTY Left 06/29/2020  ? Procedure: UNICOMPARTMENTAL KNEE LATERALLY;  Surgeon: Paralee Cancel, MD;  [facility];  Service: Orthopedics;  Laterality: Left;  70 MINS  ? TONSILLECTOMY    ? adenoids  ? ?Social History  ? ?Occupational History  ? Occupation: disabled  ?Tobacco Use  ? Smoking status: Never  ? Smokeless tobacco: Never  ?Vaping Use  ? Vaping Use: Never used  ?Substance and Sexual Activity  ? Alcohol use: No  ?  Alcohol/week: 0.0 standard drinks  ? Drug use: No  ? Sexual activity: Yes  ?  Birth control/protection: Pill  ? ? ? ? ? ? ?

## 2021-04-25 ENCOUNTER — Encounter: Payer: Self-pay | Admitting: Physical Therapy

## 2021-04-25 ENCOUNTER — Other Ambulatory Visit: Payer: Self-pay

## 2021-04-25 ENCOUNTER — Ambulatory Visit: Payer: Medicare HMO | Attending: Orthopedic Surgery | Admitting: Physical Therapy

## 2021-04-25 DIAGNOSIS — R262 Difficulty in walking, not elsewhere classified: Secondary | ICD-10-CM | POA: Diagnosis not present

## 2021-04-25 DIAGNOSIS — M25562 Pain in left knee: Secondary | ICD-10-CM | POA: Diagnosis not present

## 2021-04-25 DIAGNOSIS — M25662 Stiffness of left knee, not elsewhere classified: Secondary | ICD-10-CM | POA: Diagnosis not present

## 2021-04-25 DIAGNOSIS — R600 Localized edema: Secondary | ICD-10-CM | POA: Diagnosis not present

## 2021-04-25 DIAGNOSIS — R296 Repeated falls: Secondary | ICD-10-CM | POA: Diagnosis not present

## 2021-04-25 NOTE — Therapy (Signed)
[facility] [address] ?[place], [place], [address] ?Phone: [phone]   Fax:  [phone] ? ?Physical Therapy Evaluation ? ?Patient Details  ?Name: Wanda Oneill ?MRN: [id number] ?Date of Birth: January 26, 1981 ?Referring Provider (PT): Alvan Dame ? ? ?Encounter Date: 04/25/2021 ? ? PT End of Session - 04/25/21 1736   ? ? Visit Number 1   ? Number of Visits 13   ? Date for PT Re-Evaluation 07/30/21   ? Authorization Type [employer]   ? PT Start Time 1656   ? PT Stop Time 1740   ? PT Time Calculation (min) 44 min   ? Activity Tolerance Patient tolerated treatment well   ? Behavior During Therapy [facility] for tasks assessed/performed   ? ?  ?  ? ?  ? ? ?Past Medical History:  ?Diagnosis Date  ? Diabetes mellitus   ? type II  ? Down syndrome   ? Family history of adverse reaction to anesthesia   ? mom has had n/v  ? Gastritis   ? Headache   ? Hepatic steatosis   ? Hidradenitis   ? Hypothyroidism   ? Irritable bowel syndrome   ? Periumbilical hernia   ? Pneumonia 03/2020  ? frequent   ? Restless legs   ? Tendonitis   ? chronic in left foot  ? Thyroid disease   ? hypothyroidism  ? ? ?Past Surgical History:  ?Procedure Laterality Date  ? AXILLARY HIDRADENITIS EXCISION Bilateral   ? CHOLECYSTECTOMY    ? HERNIA REPAIR    ? multiple  ? INCISIONAL HERNIA REPAIR  05/30/2015  ? LAPROSCOPIC  ? INCISIONAL HERNIA REPAIR N/A 05/30/2015  ? Procedure: LAPAROSCOPIC INCISIONAL HERNIA;  Surgeon: Rolm Bookbinder, MD;  Location: [facility];  Service: General;  Laterality: N/A;  ? INSERTION OF MESH N/A 05/30/2015  ? Procedure: INSERTION OF MESH;  Surgeon: Rolm Bookbinder, MD;  Location: [facility];  Service: General;  Laterality: N/A;  ? KNEE ARTHROSCOPY    ? right knee  ? LAPAROSCOPY N/A 05/30/2015  ? Procedure: LAPAROSCOPY DIAGNOSTIC;  Surgeon: Rolm Bookbinder, MD;  Location: [facility];  Service: General;  Laterality: N/A;  ? PARTIAL KNEE ARTHROPLASTY Right 10/30/2017  ? Procedure: UNICOMPARTMENTAL RIGHT KNEE  LATERAL;  Surgeon: Paralee Cancel, MD;  [facility];  Service: Orthopedics;  Laterality: Right;  90 mins  ? PARTIAL KNEE ARTHROPLASTY Left 06/29/2020  ? Procedure: UNICOMPARTMENTAL KNEE LATERALLY;  Surgeon: Paralee Cancel, MD;  [facility];  Service: Orthopedics;  Laterality: Left;  70 MINS  ? TONSILLECTOMY    ? adenoids  ? ? ?There were no vitals filed for this visit. ? ? ? Subjective Assessment - 04/25/21 1700   ? ? Subjective Patient reports that she had a fire drill at school and really struggled on the stairs, she has been a patient here with me before for partial knee replacement, she had a few falls.  She denies pain, just fear of walking and falling   ? Limitations Standing;House hold activities   ? Patient Stated Goals be able to walk better, go up and down stairs without fear, get up and down from the floor without difficulty   ? Currently in Pain? Yes   ? Pain Location Knee   ? Pain Orientation Left   ? Pain Descriptors / Indicators Sore;Tightness   ? Pain Type Acute pain   ? Pain Onset More than a month ago   ? Pain Frequency Intermittent   ? Aggravating Factors  stairs, walking, feels like it will give out  pain up to 5/10 at times   ? Pain Relieving Factors rest, lying down, pain meds   ? Effect of Pain on Daily Activities difficulty with stairs, some difficulty with school and walking   ? ?  ?  ? ?  ? ? ? ? ? [facility] PT Assessment - 04/25/21 0001   ? ?  ? Assessment  ? Medical Diagnosis left knee pain, difficulty walking   ? Referring Provider (PT) Alvan Dame   ? Onset Date/Surgical Date 04/11/21   ? Prior Therapy yes for knees and back   ?  ? Precautions  ? Precautions Fall   ?  ? Balance Screen  ? Has the patient fallen in the past 6 months Yes   ? How many times? 1   ? Has the patient had a decrease in activity level because of a fear of falling?  Yes   ? Is the patient reluctant to leave their home because of a fear of falling?  No   ?  ? Home Environment  ? Additional Comments single story, does  cooking and cleaning, currently living with parents until she recovers   ?  ? Prior Function  ? Level of Independence Independent   ? Vocation Student   ? Leisure no exercise, likes to cook   ?  ? ROM / Strength  ? AROM / PROM / Strength AROM;Strength   ?  ? AROM  ? AROM Assessment Site Knee   ? Right/Left Knee Left   ? Left Knee Extension 0   ? Left Knee Flexion 90   ?  ? Strength  ? Strength Assessment Site Knee   ? Right/Left Knee Left   ? Left Knee Flexion 3+/5   ? Left Knee Extension 3+/5   ?  ? Palpation  ? Palpation comment some mm atrophy of the left thigh, some tenderness over the patellar arrea,some poppin in the left knee with motions   ?  ? Ambulation/Gait  ? Gait Comments uses SPC, slow, very cautious on uneven terrain   ?  ? Standardized Balance Assessment  ? Standardized Balance Assessment Timed Up and Go Test;Berg Balance Test   ?  ? Berg Balance Test  ? Sit to Stand Able to stand without using hands and stabilize independently   ? Standing Unsupported Able to stand safely 2 minutes   ? Sitting with Back Unsupported but Feet Supported on Floor or Stool Able to sit safely and securely 2 minutes   ? Stand to Sit Sits safely with minimal use of hands   ? Transfers Able to transfer safely, definite need of hands   ? Standing Unsupported with Eyes Closed Able to stand 10 seconds with supervision   ? Standing Unsupported with Feet Together Able to place feet together independently and stand for 1 minute with supervision   ? From Standing, Reach Forward with Outstretched Arm Can reach forward >12 cm safely (5")   ? From Standing Position, Pick up Object from Floor Able to pick up shoe, needs supervision   ? From Standing Position, Turn to Look Behind Over each Shoulder Turn sideways only but maintains balance   ? Turn 360 Degrees Able to turn 360 degrees safely one side only in 4 seconds or less   ? Standing Unsupported, Alternately Place Feet on Step/Stool Able to complete >2 steps/needs minimal assist   ?  Standing Unsupported, One Foot in Front Needs help to  step but can hold 15 seconds   ? Standing on One Leg Tries to lift leg/unable to hold 3 seconds but remains standing independently   ? Total Score 39   ?  ? Timed Up and Go Test  ? Normal TUG (seconds) 25   ? TUG Comments spc   ? ?  ?  ? ?  ? ? ? ? ? ? ? ? ? ? ? ? ? ?Objective measurements completed on examination: See above findings.  ? ? ? ? ? ? ? ? ? ? ? ? ? ? ? ? PT Short Term Goals - 04/25/21 1750   ? ?  ? PT SHORT TERM GOAL #1  ? Title independent with initial HEP   ? Time 2   ? Period Weeks   ? Status New   ? ?  ?  ? ?  ? ? ? ? PT Long Term Goals - 04/25/21 1750   ? ?  ? PT LONG TERM GOAL #1  ? Title increase BERG to 45/56   ? Time 12   ? Period Weeks   ? Status New   ?  ? PT LONG TERM GOAL #2  ? Title dcrease pain 50%   ? Time 12   ? Period Weeks   ? Status New   ?  ? PT LONG TERM GOAL #3  ? Title increase left knee AROM to 0-110 degrees flexion   ? Time 12   ? Period Weeks   ? Status New   ?  ? PT LONG TERM GOAL #4  ? Title walk 500 feet without rest   ? Time 12   ? Period Weeks   ? Status New   ?  ? PT LONG TERM GOAL #5  ? Title decrease TUG time to 16 seconds   ? Time 12   ? Period Weeks   ? Status New   ? ?  ?  ? ?  ? ? ? ? ? ? ? ? ? Plan - 04/25/21 1739   ? ? Clinical Impression Statement Patient is familiar to me due to past partial TKA's bilaterally, she has had multiple falls as well, the last partial TKA she continued to have some falls and fear of stairs, she reports a fall in the past 6 months and recently she reports a fire drill at school she really could not use the stairs, needed a lot of help, she has lost strength in the left knee for extension, has some mm atrophy here, her TUG is 25 seconds and her berg balance score is 39/56 putting her at a high risk for falls, when going down stairs the left knee does not hold her eccentricaly and she just kind of drops down   ? Stability/Clinical Decision Making Stable/Uncomplicated   ? Clinical  Decision Making Low   ? Rehab Potential Good   ? PT Frequency 1x / week   ? PT Duration 12 weeks   ? PT Treatment/Interventions ADLs/Self Care Home Management;Cryotherapy;Electrical Stimulation;Iontophoresis

## 2021-05-01 ENCOUNTER — Other Ambulatory Visit: Payer: Self-pay

## 2021-05-01 MED ORDER — METFORMIN HCL 1000 MG PO TABS: 1000.0000 mg | ORAL_TABLET | Freq: Two times a day (BID) | ORAL | 2 refills | Status: DC

## 2021-05-01 MED ORDER — LEVOTHYROXINE SODIUM 150 MCG PO TABS: 150.0000 ug | ORAL_TABLET | Freq: Every day | ORAL | 3 refills | Status: DC

## 2021-05-01 MED ORDER — CHOLESTYRAMINE 4 G PO PACK: PACK | ORAL | 3 refills | Status: DC

## 2021-05-01 MED ORDER — TRULICITY 1.5 MG/0.5ML ~~LOC~~ SOAJ: 1.5000 mg | SUBCUTANEOUS | 3 refills | Status: DC

## 2021-05-01 MED ORDER — ALLOPURINOL 300 MG PO TABS: 300.0000 mg | ORAL_TABLET | Freq: Every day | ORAL | 1 refills | Status: DC

## 2021-05-01 NOTE — Telephone Encounter (Signed)
Refill request sent via fax. Unable to tell in last ov note if pt is activly taking this Rx. Pt has pending appointment 05/09/21.  ? ?Please advise.  ?

## 2021-05-01 NOTE — Telephone Encounter (Signed)
As well as Fenofibrate and Monjaro.  ?

## 2021-05-02 ENCOUNTER — Ambulatory Visit: Payer: Medicare HMO | Admitting: Family Medicine

## 2021-05-02 NOTE — Addendum Note (Signed)
Addended by: Lamar Blinks C on: 05/02/2021 10:47 AM ? ? Modules accepted: Orders ? ?

## 2021-05-02 NOTE — Telephone Encounter (Signed)
Checked w her mom- she is using Mounjaro now per Dr Chalmers Cater, not trulicity.  Canceled trulicity order  ?

## 2021-05-03 ENCOUNTER — Ambulatory Visit: Payer: Medicare HMO | Admitting: Physical Therapy

## 2021-05-03 DIAGNOSIS — R296 Repeated falls: Secondary | ICD-10-CM | POA: Diagnosis not present

## 2021-05-03 DIAGNOSIS — M25562 Pain in left knee: Secondary | ICD-10-CM | POA: Diagnosis not present

## 2021-05-03 DIAGNOSIS — R262 Difficulty in walking, not elsewhere classified: Secondary | ICD-10-CM

## 2021-05-03 DIAGNOSIS — R600 Localized edema: Secondary | ICD-10-CM | POA: Diagnosis not present

## 2021-05-03 DIAGNOSIS — M25662 Stiffness of left knee, not elsewhere classified: Secondary | ICD-10-CM | POA: Diagnosis not present

## 2021-05-03 NOTE — Therapy (Signed)
[facility] ?[place] [address] ?[place], [place], [address] ?Phone: [phone]   Fax:  [phone] ? ?Physical Therapy Treatment ? ?Patient Details  ?Name: Wanda Oneill ?MRN: [id number] ?Date of Birth: Mar 16, 1980 ?Referring Provider (PT): Alvan Dame ? ? ?Encounter Date: 05/03/2021 ? ? PT End of Session - 05/03/21 1434   ? ? Visit Number 2   ? Number of Visits 13   ? Date for PT Re-Evaluation 07/30/21   ? Authorization Type [employer]   ? PT Start Time 1355   ? PT Stop Time 1440   ? PT Time Calculation (min) 45 min   ? ?  ?  ? ?  ? ? ?Past Medical History:  ?Diagnosis Date  ? Diabetes mellitus   ? type II  ? Down syndrome   ? Family history of adverse reaction to anesthesia   ? mom has had n/v  ? Gastritis   ? Headache   ? Hepatic steatosis   ? Hidradenitis   ? Hypothyroidism   ? Irritable bowel syndrome   ? Periumbilical hernia   ? Pneumonia 03/2020  ? frequent   ? Restless legs   ? Tendonitis   ? chronic in left foot  ? Thyroid disease   ? hypothyroidism  ? ? ?Past Surgical History:  ?Procedure Laterality Date  ? AXILLARY HIDRADENITIS EXCISION Bilateral   ? CHOLECYSTECTOMY    ? HERNIA REPAIR    ? multiple  ? INCISIONAL HERNIA REPAIR  05/30/2015  ? LAPROSCOPIC  ? INCISIONAL HERNIA REPAIR N/A 05/30/2015  ? Procedure: LAPAROSCOPIC INCISIONAL HERNIA;  Surgeon: Rolm Bookbinder, MD;  Location: [facility];  Service: General;  Laterality: N/A;  ? INSERTION OF MESH N/A 05/30/2015  ? Procedure: INSERTION OF MESH;  Surgeon: Rolm Bookbinder, MD;  Location: [facility];  Service: General;  Laterality: N/A;  ? KNEE ARTHROSCOPY    ? right knee  ? LAPAROSCOPY N/A 05/30/2015  ? Procedure: LAPAROSCOPY DIAGNOSTIC;  Surgeon: Rolm Bookbinder, MD;  Location: [facility];  Service: General;  Laterality: N/A;  ? PARTIAL KNEE ARTHROPLASTY Right 10/30/2017  ? Procedure: UNICOMPARTMENTAL RIGHT KNEE LATERAL;  Surgeon: Paralee Cancel, MD;  Location: [facility];  Service: Orthopedics;  Laterality: Right;  90 mins  ? PARTIAL  KNEE ARTHROPLASTY Left 06/29/2020  ? Procedure: UNICOMPARTMENTAL KNEE LATERALLY;  Surgeon: Paralee Cancel, MD;  Location[facility];  Service: Orthopedics;  Laterality: Left;  70 MINS  ? TONSILLECTOMY    ? adenoids  ? ? ?There were no vitals filed for this visit. ? ? Subjective Assessment - 05/03/21 1356   ? ? Subjective " since I knew I was coming I took a pain pill , not so much pain as I feel wek and fearful of falling because weak"   ? Currently in Pain? No/denies   ? ?  ?  ? ?  ? ? ? ? ? ? ? ? ? ? ? ? ? ? ? ? ? ? ? ? [facility] Adult PT Treatment/Exercise - 05/03/21 0001   ? ?  ? Exercises  ? Exercises Lumbar;Knee/Hip   ?  ? Knee/Hip Exercises: Aerobic  ? Nustep L 4 6 min - LE only   ?  ? Knee/Hip Exercises: Standing  ? Hip Flexion Stengthening;Both;15 reps;Knee bent;Knee straight   ? Hip Flexion Limitations 3# with RW   ? Hip Abduction Stengthening;Both;15 reps;Knee straight   ? Abduction Limitations 3# with RW   ? Hip Extension Stengthening;Both;15 reps;Knee straight   ? Extension  Limitations 3# with RW   ? Walking with Sports Cord 20# 5x fwd, 5 x backward, 3 x each side   min a with eccentric return  ? Other Standing Knee Exercises 3# HS curl with RW 15 x   ?  ? Knee/Hip Exercises: Seated  ? Long [employer] Strengthening;Both;2 sets;10 reps   ? Long Arc Quad Weight 3 lbs.   ? Hamstring Curl Strengthening;Both;2 sets;10 reps   green tband  ? Sit to Sand 10 reps;without UE support   cued for equal wt, favors RT LE  ? ?  ?  ? ?  ? ? ? ? ? ? ? ? ? ? ? ? PT Short Term Goals - 04/25/21 1750   ? ?  ? PT SHORT TERM GOAL #1  ? Title independent with initial HEP   ? Time 2   ? Period Weeks   ? Status New   ? ?  ?  ? ?  ? ? ? ? PT Long Term Goals - 04/25/21 1750   ? ?  ? PT LONG TERM GOAL #1  ? Title increase BERG to 45/56   ? Time 12   ? Period Weeks   ? Status New   ?  ? PT LONG TERM GOAL #2  ? Title dcrease pain 50%   ? Time 12   ? Period Weeks   ? Status New   ?  ? PT LONG TERM GOAL #3  ? Title increase left knee AROM to 0-110  degrees flexion   ? Time 12   ? Period Weeks   ? Status New   ?  ? PT LONG TERM GOAL #4  ? Title walk 500 feet without rest   ? Time 12   ? Period Weeks   ? Status New   ?  ? PT LONG TERM GOAL #5  ? Title decrease TUG time to 16 seconds   ? Time 12   ? Period Weeks   ? Status New   ? ?  ?  ? ?  ? ? ? ? ? ? ? ? Plan - 05/03/21 1429   ? ? Clinical Impression Statement pt tolerated LE strengtheing well including balance. assistance with resisted gait esp ecc, standing ex with RW and cuing. STS with cuing to increase wt on LLE. through out tx session pt states fear of falling ,states she unsure on uneven terraina nd uses 4WW in home.pt c/o BP with prolonged standing   ? PT Treatment/Interventions ADLs/Self Care Home Management;Cryotherapy;Electrical Stimulation;Iontophoresis '4mg'$ /ml Dexamethasone;Gait training;Stair training;Functional mobility training;Therapeutic activities;Therapeutic exercise;Balance training;Neuromuscular re-education;Manual techniques;Patient/family education   ? PT Next Visit Plan progress strength and balance   ? ?  ?  ? ?  ? ? ?Patient will benefit from skilled therapeutic intervention in order to improve the following deficits and impairments:  Abnormal gait, Decreased range of motion, Difficulty walking, Decreased endurance, Decreased activity tolerance, Pain, Decreased balance, Decreased strength, Decreased mobility ? ?Visit Diagnosis: ?Difficulty in walking, not elsewhere classified ? ?Repeated falls ? ? ? ? ?Problem List ?Patient Active Problem List  ? Diagnosis Date Noted  ? Chronic paronychia of finger 01/25/2021  ? S/P left unicompartmental knee replacement, lateral 06/29/2020  ? At risk for obstructive sleep apnea 06/11/2019  ? Cough 06/11/2019  ? Hyperglycemia 05/26/2019  ? Acute hypoxemic respiratory failure ([facility]) 05/20/2019  ? Fibromyalgia 03/09/2019  ? Neck pain, chronic 03/09/2019  ? Acute shoulder bursitis, right 02/03/2019  ? Bile salt-induced diarrhea 09/25/2018  ?  Arthritis of  left acromioclavicular joint 09/23/2018  ? Precordial chest pain 08/11/2018  ? Morbid obesity ([facility]) 10/31/2017  ? S/P right UKR, lateral 10/30/2017  ? Incarcerated epigastric hernia 05/30/2015  ? Left foot pain 08/09/2014  ? Chronic diarrhea 07/27/2014  ? Gout 08/10/2012  ? Acid reflux 01/22/2012  ? Down syndrome 11/11/2011  ? Hernia of abdominal wall 10/21/2011  ? Psoriasis 10/21/2011  ? Type 2 diabetes mellitus, uncontrolled 04/12/2011  ? Hypothyroid 04/12/2011  ? Gait abnormality 06/22/2010  ? Tibial tendinitis, posterior 06/22/2010  ? ? ?Brieann Osinski,ANGIE, PTA ?05/03/2021, 2:35 PM ? ?[facility] ?[place] ?[address] ?[place], [place], [address] ?Phone: [phone]   Fax:  [phone] ? ?Name: LAKYLA BISWAS ?MRN: [id number] ?Date of Birth: Aug 25, 1980 ? ? ? ?

## 2021-05-06 ENCOUNTER — Other Ambulatory Visit: Payer: Self-pay | Admitting: Family Medicine

## 2021-05-06 ENCOUNTER — Telehealth: Payer: Self-pay | Admitting: Family Medicine

## 2021-05-06 MED ORDER — MOLNUPIRAVIR EUA 200MG CAPSULE: 4.0000 | ORAL_CAPSULE | Freq: Two times a day (BID) | ORAL | 0 refills | Status: AC

## 2021-05-06 MED ORDER — NIRMATRELVIR/RITONAVIR (PAXLOVID)TABLET: 3.0000 | ORAL_TABLET | Freq: Two times a day (BID) | ORAL | 0 refills | Status: AC

## 2021-05-06 NOTE — Telephone Encounter (Signed)
Received call from her dad- she tested +for covid today. Sx are mild - will start her on molnupiravir  ?

## 2021-05-08 ENCOUNTER — Ambulatory Visit: Payer: Medicare HMO | Admitting: Physical Therapy

## 2021-05-09 ENCOUNTER — Ambulatory Visit: Payer: Medicare HMO | Admitting: Family Medicine

## 2021-05-17 ENCOUNTER — Ambulatory Visit: Payer: Medicare HMO | Attending: Orthopedic Surgery | Admitting: Physical Therapy

## 2021-05-17 DIAGNOSIS — M545 Low back pain, unspecified: Secondary | ICD-10-CM | POA: Diagnosis not present

## 2021-05-17 DIAGNOSIS — R600 Localized edema: Secondary | ICD-10-CM | POA: Diagnosis not present

## 2021-05-17 DIAGNOSIS — R262 Difficulty in walking, not elsewhere classified: Secondary | ICD-10-CM | POA: Insufficient documentation

## 2021-05-17 DIAGNOSIS — M25562 Pain in left knee: Secondary | ICD-10-CM | POA: Diagnosis not present

## 2021-05-17 DIAGNOSIS — R296 Repeated falls: Secondary | ICD-10-CM | POA: Diagnosis not present

## 2021-05-17 NOTE — Therapy (Signed)
[facility] ?[place] ?[address] ?[place], [place], [address] ?Phone: [phone]   Fax:  [phone] ? ?Physical Therapy Treatment ? ?Patient Details  ?Name: Wanda Oneill ?MRN: [id number] ?Date of Birth: 1980-05-13 ?Referring Provider (PT): Alvan Dame ? ? ?Encounter Date: 05/17/2021 ? ? PT End of Session - 05/17/21 1702   ? ? Visit Number 3   ? Number of Visits 13   ? Date for PT Re-Evaluation 07/30/21   ? Authorization Type [employer]   ? PT Start Time 1415   ? PT Stop Time 1505   ? PT Time Calculation (min) 50 min   ? ?  ?  ? ?  ? ? ?Past Medical History:  ?Diagnosis Date  ? Diabetes mellitus   ? type II  ? Down syndrome   ? Family history of adverse reaction to anesthesia   ? mom has had n/v  ? Gastritis   ? Headache   ? Hepatic steatosis   ? Hidradenitis   ? Hypothyroidism   ? Irritable bowel syndrome   ? Periumbilical hernia   ? Pneumonia 03/2020  ? frequent   ? Restless legs   ? Tendonitis   ? chronic in left foot  ? Thyroid disease   ? hypothyroidism  ? ? ?Past Surgical History:  ?Procedure Laterality Date  ? AXILLARY HIDRADENITIS EXCISION Bilateral   ? CHOLECYSTECTOMY    ? HERNIA REPAIR    ? multiple  ? INCISIONAL HERNIA REPAIR  05/30/2015  ? LAPROSCOPIC  ? INCISIONAL HERNIA REPAIR N/A 05/30/2015  ? Procedure: LAPAROSCOPIC INCISIONAL HERNIA;  Surgeon: Rolm Bookbinder, MD;  Location: [facility];  Service: General;  Laterality: N/A;  ? INSERTION OF MESH N/A 05/30/2015  ? Procedure: INSERTION OF MESH;  Surgeon: Rolm Bookbinder, MD;  Location: [facility];  Service: General;  Laterality: N/A;  ? KNEE ARTHROSCOPY    ? right knee  ? LAPAROSCOPY N/A 05/30/2015  ? Procedure: LAPAROSCOPY DIAGNOSTIC;  Surgeon: Rolm Bookbinder, MD;  Location: [facility];  Service: General;  Laterality: N/A;  ? PARTIAL KNEE ARTHROPLASTY Right 10/30/2017  ? Procedure: UNICOMPARTMENTAL RIGHT KNEE LATERAL;  Surgeon: Paralee Cancel, MD;  [facility];  Service: Orthopedics;  Laterality: Right;  90 mins  ? PARTIAL  KNEE ARTHROPLASTY Left 06/29/2020  ? Procedure: UNICOMPARTMENTAL KNEE LATERALLY;  Surgeon: Paralee Cancel, MD;  [facility];  Service: Orthopedics;  Laterality: Left;  70 MINS  ? TONSILLECTOMY    ? adenoids  ? ? ?There were no vitals filed for this visit. ? ? Subjective Assessment - 05/17/21 1614   ? ? Subjective no falls but nervous, every now and then feels like it wants to buckle. back hurt. practiced steps   ? Currently in Pain? Yes   ? Pain Score 5    ? Pain Location Back   ? ?  ?  ? ?  ? ? ? ? ? ? ? ? ? ? ? ? ? ? ? ? ? ? ? ? [facility] Adult PT Treatment/Exercise - 05/17/21 0001   ? ?  ? Lumbar Exercises: Machines for Strengthening  ? Cybex Knee Extension 10 # 10 x then 5# SL 10 x each ( left ecc)   ? Cybex Knee Flexion 20# 10 x then 10 # 10 x single leg   ?  ? Lumbar Exercises: Seated  ? Sit to Stand 10 reps   on airex without UE  ?  ? Knee/Hip Exercises: Aerobic  ? Nustep L 4  6 min - LE only   ?  ? Knee/Hip Exercises: Standing  ? Forward Step Up Both;10 reps;Hand Hold: 1;Step Height: 4"   with SPC and cuing  ? Other Standing Knee Exercises HHA and SPC stepping over and back and then on/off fwd 10 each leg   ?  ? Knee/Hip Exercises: Seated  ? Long [employer] Strengthening;Both;2 sets;10 reps   green tband  ? Hamstring Curl Strengthening;Both;2 sets;10 reps   green tband  ? ?  ?  ? ?  ? ? ? ? ? ? ? ? ? ? ? ? PT Short Term Goals - 05/17/21 1649   ? ?  ? PT SHORT TERM GOAL #1  ? Title independent with initial HEP   ? Status Achieved   ? ?  ?  ? ?  ? ? ? ? PT Long Term Goals - 05/17/21 1649   ? ?  ? PT LONG TERM GOAL #3  ? Title increase left knee AROM to 0-110 degrees flexion   ? Baseline 0-120   ? Status Achieved   ? ?  ?  ? ?  ? ? ? ? ? ? ? ? Plan - 05/17/21 1701   ? ? Clinical Impression Statement pt has been gone 2 weeks with covid, overall doing well but still fearful and c/o knee instability. step ups,over and down with SPC and HHA . left quad weakness noted but good ROM   ? PT Treatment/Interventions ADLs/Self  Care Home Management;Cryotherapy;Electrical Stimulation;Iontophoresis '4mg'$ /ml Dexamethasone;Gait training;Stair training;Functional mobility training;Therapeutic activities;Therapeutic exercise;Balance training;Neuromuscular re-education;Manual techniques;Patient/family education   ? PT Next Visit Plan progress strength and balance   ? ?  ?  ? ?  ? ? ?Patient will benefit from skilled therapeutic intervention in order to improve the following deficits and impairments:  Abnormal gait, Decreased range of motion, Difficulty walking, Decreased endurance, Decreased activity tolerance, Pain, Decreased balance, Decreased strength, Decreased mobility ? ?Visit Diagnosis: ?Difficulty in walking, not elsewhere classified ? ?Repeated falls ? ? ? ? ?Problem List ?Patient Active Problem List  ? Diagnosis Date Noted  ? Chronic paronychia of finger 01/25/2021  ? S/P left unicompartmental knee replacement, lateral 06/29/2020  ? At risk for obstructive sleep apnea 06/11/2019  ? Cough 06/11/2019  ? Hyperglycemia 05/26/2019  ? Acute hypoxemic respiratory failure ([facility]) 05/20/2019  ? Fibromyalgia 03/09/2019  ? Neck pain, chronic 03/09/2019  ? Acute shoulder bursitis, right 02/03/2019  ? Bile salt-induced diarrhea 09/25/2018  ? Arthritis of left acromioclavicular joint 09/23/2018  ? Precordial chest pain 08/11/2018  ? Morbid obesity ([facility]) 10/31/2017  ? S/P right UKR, lateral 10/30/2017  ? Incarcerated epigastric hernia 05/30/2015  ? Left foot pain 08/09/2014  ? Chronic diarrhea 07/27/2014  ? Gout 08/10/2012  ? Acid reflux 01/22/2012  ? Down syndrome 11/11/2011  ? Hernia of abdominal wall 10/21/2011  ? Psoriasis 10/21/2011  ? Type 2 diabetes mellitus, uncontrolled 04/12/2011  ? Hypothyroid 04/12/2011  ? Gait abnormality 06/22/2010  ? Tibial tendinitis, posterior 06/22/2010  ? ? ?Dwain Huhn,ANGIE, PTA ?05/17/2021, 5:03 PM ? ?[facility] ?[place] ?[address] ?[place], [place], [address] ?Phone:  [phone]   Fax:  [phone] ? ?Name: BRIA SPARR ?MRN: [id number] ?Date of Birth: February 15, 1980 ? ? ? ?

## 2021-05-22 NOTE — Progress Notes (Signed)
[facility], [facility] at [facility] ?[address], [address] ?[place], [place] [address] ?336 [phone] ?Fax [phone] ? ?Date:  05/24/2021  ? ?Name:  Wanda Oneill   DOB:  1980-10-12   MRN:  [id number] ? ?PCP:  Darreld Mclean, MD  ? ? ?Chief Complaint: lesion on scalp (Concerns/ questions: Breakthrough bleeding/) ? ? ?History of Present Illness: ? ?Wanda Oneill is a 41 y.o. very pleasant female patient who presents with the following: ? ?Wanda Oneill is a charming woman with Down syndrome, daughter of my former partner Dr. Ruben Reason ?History of diabetes, hypothyroidism, obesity, Down syndrome and several associated MSK problems ? ?She recently had covid 19 for the first time- she did well, took paxlovid and has recovered ?She feels like she is mostly back to normal except for mild residual cough ? ?New concern of a lesion on her scalp-they have noticed a lesion on her scalp which is been present for perhaps 2 to 3 months.  It is itchy, has not healed despite using Neosporin. ? ?Most recent visit with myself was in November-at that time she was struggling with a finger infection ?She ended up being seen by orthopedics, most recent visit with Dr. Tempie Donning last month ?They are currently using gentian violet daily on both of her long fingers.  This does seem to be helping resolve chronic paronychia ? ?Her diabetes is managed by Dr. Chalmers Cater - she manages her thyroid and her DM  ? ?Urine microalbumin can be updated unless done by endocrinology ?Can offer Pap smear-this has not been done yet.  Patient has felt uneasy about this procedure, she has never been sexually active or used tampons ?Eye exam ?EFEOF-12 booster ?Mammogram- not done yet. Advised time to start!  Discussed procedure and offered reassurance- ordered today  ? ?She has noted more menstrual irregularity over the last month or so ?She has been on OCP since her teens ?She takes her OCP continuously with intermittent 5 to 7-day breaks to allow for  scheduled bleed.  This is typically worked very well except for period of time last summer and over the last month or so. ? ?We dealt with spotting and irregularity also last summer- see my note dated 09/04/2020 ?Again discussed this in detail with patient and her mom.  I am concerned about this change in her bleeding pattern, endometrial hyperplasia is a possibility.  Also as above she has not been screened for cervical cancer.  Cervical cancer is less likely as she has never had sexual contact, but is not impossible. ? ? ?Wt Readings from Last 3 Encounters:  ?05/24/21 234 lb 12.8 oz (106.5 kg)  ?01/25/21 235 lb (106.6 kg)  ?12/20/20 235 lb 12.8 oz (107 kg)  ? ? ?Patient Active Problem List  ? Diagnosis Date Noted  ? Chronic paronychia of finger 01/25/2021  ? S/P left unicompartmental knee replacement, lateral 06/29/2020  ? At risk for obstructive sleep apnea 06/11/2019  ? Cough 06/11/2019  ? Hyperglycemia 05/26/2019  ? Acute hypoxemic respiratory failure ([facility]) 05/20/2019  ? Fibromyalgia 03/09/2019  ? Neck pain, chronic 03/09/2019  ? Acute shoulder bursitis, right 02/03/2019  ? Bile salt-induced diarrhea 09/25/2018  ? Arthritis of left acromioclavicular joint 09/23/2018  ? Precordial chest pain 08/11/2018  ? Morbid obesity ([facility]) 10/31/2017  ? S/P right UKR, lateral 10/30/2017  ? Incarcerated epigastric hernia 05/30/2015  ? Left foot pain 08/09/2014  ? Chronic diarrhea 07/27/2014  ? Gout 08/10/2012  ? Acid reflux 01/22/2012  ?  Down syndrome 11/11/2011  ? Hernia of abdominal wall 10/21/2011  ? Psoriasis 10/21/2011  ? Type 2 diabetes mellitus, uncontrolled 04/12/2011  ? Hypothyroid 04/12/2011  ? Gait abnormality 06/22/2010  ? Tibial tendinitis, posterior 06/22/2010  ? ? ?Past Medical History:  ?Diagnosis Date  ? Diabetes mellitus   ? type II  ? Down syndrome   ? Family history of adverse reaction to anesthesia   ? mom has had n/v  ? Gastritis   ? Headache   ? Hepatic steatosis   ? Hidradenitis   ? Hypothyroidism   ?  Irritable bowel syndrome   ? Periumbilical hernia   ? Pneumonia 03/2020  ? frequent   ? Restless legs   ? Tendonitis   ? chronic in left foot  ? Thyroid disease   ? hypothyroidism  ? ? ?Past Surgical History:  ?Procedure Laterality Date  ? AXILLARY HIDRADENITIS EXCISION Bilateral   ? CHOLECYSTECTOMY    ? HERNIA REPAIR    ? multiple  ? INCISIONAL HERNIA REPAIR  05/30/2015  ? LAPROSCOPIC  ? INCISIONAL HERNIA REPAIR N/A 05/30/2015  ? Procedure: LAPAROSCOPIC INCISIONAL HERNIA;  Surgeon: Rolm Bookbinder, MD;  Location: [facility];  Service: General;  Laterality: N/A;  ? INSERTION OF MESH N/A 05/30/2015  ? Procedure: INSERTION OF MESH;  Surgeon: Rolm Bookbinder, MD;  Location: [facility];  Service: General;  Laterality: N/A;  ? KNEE ARTHROSCOPY    ? right knee  ? LAPAROSCOPY N/A 05/30/2015  ? Procedure: LAPAROSCOPY DIAGNOSTIC;  Surgeon: Rolm Bookbinder, MD;  Location: [facility];  Service: General;  Laterality: N/A;  ? PARTIAL KNEE ARTHROPLASTY Right 10/30/2017  ? Procedure: UNICOMPARTMENTAL RIGHT KNEE LATERAL;  Surgeon: Paralee Cancel, MD;  [facility];  Service: Orthopedics;  Laterality: Right;  90 mins  ? PARTIAL KNEE ARTHROPLASTY Left 06/29/2020  ? Procedure: UNICOMPARTMENTAL KNEE LATERALLY;  Surgeon: Paralee Cancel, MD;  [facility];  Service: Orthopedics;  Laterality: Left;  70 MINS  ? TONSILLECTOMY    ? adenoids  ? ? ?Social History  ? ?Tobacco Use  ? Smoking status: Never  ? Smokeless tobacco: Never  ?Vaping Use  ? Vaping Use: Never used  ?Substance Use Topics  ? Alcohol use: No  ?  Alcohol/week: 0.0 standard drinks  ? Drug use: No  ? ? ?Family History  ?Problem Relation Age of Onset  ? Thyroid cancer Mother   ? Diabetes type II Mother   ? High Cholesterol Mother   ? Heart disease Mother   ? Diabetes type II Father   ? Hypertension Father   ? Stroke Father   ? ? ?Allergies  ?Allergen Reactions  ? Vancomycin Itching  ? Cefuroxime Axetil Hives  ? Sulfa Antibiotics Hives  ? ? ?Medication list has been reviewed and  updated. ? ?Current Outpatient Medications on File Prior to Visit  ?Medication Sig Dispense Refill  ? acetaminophen (TYLENOL) 325 MG tablet Take 650 mg by mouth every 6 (six) hours as needed for mild pain.     ? allopurinol (ZYLOPRIM) 300 MG tablet Take 1 tablet (300 mg total) by mouth daily. 90 tablet 1  ? Blood Glucose Monitoring Suppl (BLOOD GLUCOSE METER KIT AND SUPPLIES) Dispense based on patient and insurance preference. To check blood sugars daily FOR ICD-9 250.00 (Patient taking differently: 1 each by Other route See admin instructions. Dispense based on patient and insurance preference. To check blood sugars daily FOR ICD-9 250.00) 1 each 0  ? Blood Glucose Monitoring Suppl (TRUE METRIX METER) w/Device  KIT Use as directed to check glucose up to two times daily. Dx Code E11.9 1 kit 0  ? cholestyramine (QUESTRAN) 4 g packet DISSOLVE AND TAKE 1 PACKET OF POWDER BY MOUTH TWICE DAILY 180 each 3  ? dicyclomine (BENTYL) 20 MG tablet Take 20 mg by mouth daily as needed for spasms.    ? empagliflozin (JARDIANCE) 25 MG TABS tablet Take 25 mg by mouth daily.    ? Glucose Blood (BLOOD GLUCOSE TEST STRIPS) STRP 1 each by Other route See admin instructions. Use to test blood sugar up to twice a day 100 strip 3  ? glucose blood (FREESTYLE TEST STRIPS) test strip Use as directed to test blood sugar up to twice a day. Dx code E11.9 100 each 12  ? glucose blood (TRUE METRIX BLOOD GLUCOSE TEST) test strip Use as instructed to check glucose up to 2 times daily. Dx Code E11.9 100 each 12  ? levonorgestrel-ethinyl estradiol (JOLESSA) 0.15-0.03 MG tablet Take 1 tablet by mouth once daily 84 tablet 3  ? levothyroxine (SYNTHROID) 150 MCG tablet Take 1 tablet (150 mcg total) by mouth daily before breakfast. 30 tablet 3  ? metFORMIN (GLUCOPHAGE) 1000 MG tablet Take 1 tablet (1,000 mg total) by mouth 2 (two) times daily. 180 tablet 2  ? methocarbamol (ROBAXIN) 500 MG tablet Take 1 tablet (500 mg total) by mouth every 6 (six) hours as  needed for muscle spasms. 40 tablet 0  ? ONE TOUCH ULTRA TEST test strip USE TO CHECK BLOOD SUGAR ONCE DAILY (ICD CODE:25.00) (Patient taking differently: 1 each by Other route daily.) 100 each 3  ? tirzepatide (

## 2021-05-24 ENCOUNTER — Ambulatory Visit: Payer: Medicare HMO | Admitting: Physical Therapy

## 2021-05-24 ENCOUNTER — Ambulatory Visit (INDEPENDENT_AMBULATORY_CARE_PROVIDER_SITE_OTHER): Payer: Medicare HMO | Admitting: Family Medicine

## 2021-05-24 VITALS — BP 112/70 | HR 80 | Temp 97.8°F | Resp 18 | Ht 64.0 in | Wt 234.8 lb

## 2021-05-24 DIAGNOSIS — B372 Candidiasis of skin and nail: Secondary | ICD-10-CM | POA: Diagnosis not present

## 2021-05-24 DIAGNOSIS — R262 Difficulty in walking, not elsewhere classified: Secondary | ICD-10-CM | POA: Diagnosis not present

## 2021-05-24 DIAGNOSIS — N926 Irregular menstruation, unspecified: Secondary | ICD-10-CM

## 2021-05-24 DIAGNOSIS — Z1231 Encounter for screening mammogram for malignant neoplasm of breast: Secondary | ICD-10-CM

## 2021-05-24 DIAGNOSIS — M545 Low back pain, unspecified: Secondary | ICD-10-CM | POA: Diagnosis not present

## 2021-05-24 DIAGNOSIS — E118 Type 2 diabetes mellitus with unspecified complications: Secondary | ICD-10-CM

## 2021-05-24 DIAGNOSIS — R296 Repeated falls: Secondary | ICD-10-CM | POA: Diagnosis not present

## 2021-05-24 DIAGNOSIS — M25562 Pain in left knee: Secondary | ICD-10-CM | POA: Diagnosis not present

## 2021-05-24 DIAGNOSIS — R600 Localized edema: Secondary | ICD-10-CM | POA: Diagnosis not present

## 2021-05-24 LAB — MICROALBUMIN / CREATININE URINE RATIO
Creatinine,U: 29.5 mg/dL
Microalb Creat Ratio: 4.5 mg/g (ref 0.0–30.0)
Microalb, Ur: 1.3 mg/dL (ref 0.0–1.9)

## 2021-05-24 MED ORDER — NYSTATIN 100000 UNIT/GM EX POWD: 1.0000 "application " | Freq: Three times a day (TID) | CUTANEOUS | 1 refills | Status: DC

## 2021-05-24 NOTE — Therapy (Signed)
[facility] ?[place] ?[address] ?[place], [place], [address] ?Phone: [phone]   Fax:  [phone] ? ?Physical Therapy Treatment ? ?Patient Details  ?Name: Wanda Oneill ?MRN: [id number] ?Date of Birth: November 19, 1980 ?Referring Provider (PT): Alvan Dame ? ? ?Encounter Date: 05/24/2021 ? ? PT End of Session - 05/24/21 1654   ? ? Visit Number 4   ? Number of Visits 13   ? Date for PT Re-Evaluation 07/30/21   ? Authorization Type Humana   ? PT Start Time 1411   ? PT Stop Time 1500   ? PT Time Calculation (min) 49 min   ? ?  ?  ? ?  ? ? ?Past Medical History:  ?Diagnosis Date  ? Diabetes mellitus   ? type II  ? Down syndrome   ? Family history of adverse reaction to anesthesia   ? mom has had n/v  ? Gastritis   ? Headache   ? Hepatic steatosis   ? Hidradenitis   ? Hypothyroidism   ? Irritable bowel syndrome   ? Periumbilical hernia   ? Pneumonia 03/2020  ? frequent   ? Restless legs   ? Tendonitis   ? chronic in left foot  ? Thyroid disease   ? hypothyroidism  ? ? ?Past Surgical History:  ?Procedure Laterality Date  ? AXILLARY HIDRADENITIS EXCISION Bilateral   ? CHOLECYSTECTOMY    ? HERNIA REPAIR    ? multiple  ? INCISIONAL HERNIA REPAIR  05/30/2015  ? LAPROSCOPIC  ? INCISIONAL HERNIA REPAIR N/A 05/30/2015  ? Procedure: LAPAROSCOPIC INCISIONAL HERNIA;  Surgeon: Rolm Bookbinder, MD;  Location: [facility];  Service: General;  Laterality: N/A;  ? INSERTION OF MESH N/A 05/30/2015  ? Procedure: INSERTION OF MESH;  Surgeon: Rolm Bookbinder, MD;  Location: [facility];  Service: General;  Laterality: N/A;  ? KNEE ARTHROSCOPY    ? right knee  ? LAPAROSCOPY N/A 05/30/2015  ? Procedure: LAPAROSCOPY DIAGNOSTIC;  Surgeon: Rolm Bookbinder, MD;  Location: [facility];  Service: General;  Laterality: N/A;  ? PARTIAL KNEE ARTHROPLASTY Right 10/30/2017  ? Procedure: UNICOMPARTMENTAL RIGHT KNEE LATERAL;  Surgeon: Paralee Cancel, MD;  [facility];  Service: Orthopedics;  Laterality: Right;  90 mins  ? PARTIAL  KNEE ARTHROPLASTY Left 06/29/2020  ? Procedure: UNICOMPARTMENTAL KNEE LATERALLY;  Surgeon: Paralee Cancel, MD;  [facility];  Service: Orthopedics;  Laterality: Left;  70 MINS  ? TONSILLECTOMY    ? adenoids  ? ? ?There were no vitals filed for this visit. ? ? Subjective Assessment - 05/24/21 1627   ? ? Subjective been staying iwth my mom and practicing 2-3 steps   ? Currently in Pain? Yes   ? Pain Score 2    ? Pain Location Knee   ? Pain Orientation Left   ? ?  ?  ? ?  ? ? ? ? ? ? ? ? ? ? ? ? ? ? ? ? ? ? ? ? [facility] Adult PT Treatment/Exercise - 05/24/21 0001   ? ?  ? Knee/Hip Exercises: Aerobic  ? Nustep L 6 6 min - LE only   ?  ? Knee/Hip Exercises: Standing  ? Walking with Sports Cord 20# 5x fwd, 5 x backward, 3 x each side   ? Other Standing Knee Exercises 6 inch step tap fwd and laterally each leg 10 x with SPC and SBA   ?  ? Knee/Hip Exercises: Seated  ? Long [employer] Strengthening;Both;2 sets;10 reps;Weights   ?  Long Arc Quad Weight 3 lbs.   ? Hamstring Curl Strengthening;Both;2 sets;10 reps   green tband  ? Sit to Sand 10 reps;without UE support   on airex- did well but very nervous  ? ?  ?  ? ?  ? ? ? ? ? ? ? ? ? ? ? ? PT Short Term Goals - 05/17/21 1649   ? ?  ? PT SHORT TERM GOAL #1  ? Title independent with initial HEP   ? Status Achieved   ? ?  ?  ? ?  ? ? ? ? PT Long Term Goals - 05/24/21 1643   ? ?  ? PT LONG TERM GOAL #1  ? Title increase BERG to 45/56   ? Status On-going   ?  ? PT LONG TERM GOAL #2  ? Title dcrease pain 50%   ? Status Partially Met   ?  ? PT LONG TERM GOAL #3  ? Title increase left knee AROM to 0-110 degrees flexion   ? Status Achieved   ?  ? PT LONG TERM GOAL #4  ? Title walk 500 feet without rest   ? Status On-going   ?  ? PT LONG TERM GOAL #5  ? Title decrease TUG time to 16 seconds   ? Baseline with SPC 14.83. no AD 15.10 sec   ? Status Achieved   ? ?  ?  ? ?  ? ? ? ? ? ? ? ? Plan - 05/24/21 1654   ? ? Clinical Impression Statement pt is getting more confident and moving quicker  with gait and transitions, still learry on dynamic surfaces. progressing with goals and TUG goal is met. educ on UE strengthening ex with 2# wts at pt request.   ? PT Treatment/Interventions ADLs/Self Care Home Management;Cryotherapy;Electrical Stimulation;Iontophoresis 86m/ml Dexamethasone;Gait training;Stair training;Functional mobility training;Therapeutic activities;Therapeutic exercise;Balance training;Neuromuscular re-education;Manual techniques;Patient/family education   ? PT Next Visit Plan progress strength and balance   ? ?  ?  ? ?  ? ? ?Patient will benefit from skilled therapeutic intervention in order to improve the following deficits and impairments:  Abnormal gait, Decreased range of motion, Difficulty walking, Decreased endurance, Decreased activity tolerance, Pain, Decreased balance, Decreased strength, Decreased mobility ? ?Visit Diagnosis: ?Difficulty in walking, not elsewhere classified ? ?Repeated falls ? ? ? ? ?Problem List ?Patient Active Problem List  ? Diagnosis Date Noted  ? Chronic paronychia of finger 01/25/2021  ? S/P left unicompartmental knee replacement, lateral 06/29/2020  ? At risk for obstructive sleep apnea 06/11/2019  ? Cough 06/11/2019  ? Hyperglycemia 05/26/2019  ? Acute hypoxemic respiratory failure ([facility] 05/20/2019  ? Fibromyalgia 03/09/2019  ? Neck pain, chronic 03/09/2019  ? Acute shoulder bursitis, right 02/03/2019  ? Bile salt-induced diarrhea 09/25/2018  ? Arthritis of left acromioclavicular joint 09/23/2018  ? Precordial chest pain 08/11/2018  ? Morbid obesity ([place] 10/31/2017  ? S/P right UKR, lateral 10/30/2017  ? Incarcerated epigastric hernia 05/30/2015  ? Left foot pain 08/09/2014  ? Chronic diarrhea 07/27/2014  ? Gout 08/10/2012  ? Acid reflux 01/22/2012  ? Down syndrome 11/11/2011  ? Hernia of abdominal wall 10/21/2011  ? Psoriasis 10/21/2011  ? Type 2 diabetes mellitus, uncontrolled 04/12/2011  ? Hypothyroid 04/12/2011  ? Gait abnormality 06/22/2010  ? Tibial  tendinitis, posterior 06/22/2010  ? ? ?Zamier Eggebrecht,ANGIE, PTA ?05/24/2021, 4:57 PM ? ?[facility] ?[address] ?[place] [address]?Phone: [phone]  Fax:  [phone]? ?  Name: Wanda Oneill ?MRN: [id number] ?Date of Birth: [birth year] ? ? ? ?

## 2021-05-24 NOTE — Patient Instructions (Addendum)
It was good to see you again today!   ? ?Referral for mammogram ?Referral to see DR Sabra Heck about your periods ?Referral to Dr Fontaine No for your skin/ scalp ? ?Nystatin powder- use as needed for your skin rash  ?

## 2021-05-25 ENCOUNTER — Other Ambulatory Visit: Payer: Self-pay

## 2021-05-25 DIAGNOSIS — L989 Disorder of the skin and subcutaneous tissue, unspecified: Secondary | ICD-10-CM

## 2021-05-26 NOTE — Progress Notes (Signed)
Therapist, music at [facility] ?[address], [address] ?[place], [place] [address] ?336 [phone] ?Fax [phone] ? ?Date:  05/30/2021  ? ?Name:  Wanda Oneill   DOB:  [birth year]   MRN:  [id number] ? ?PCP:  Wanda Mclean, MD  ? ? ?Chief Complaint: skin lesion (Biopsy and Pap) ? ? ?History of Present Illness: ? ?Wanda Oneill is a 41 y.o. very pleasant female patient who presents with the following: ? ?Pt seen today to have a pap and also bx of a skin lesion on her scalp  ?She has been on OCP since her teen years for menstrual regulation.  However, over the last year she has had more difficulty with breakthrough bleeding and unscheduled bleeding.  Wanda Oneill has Down syndrome and has never had sexual intercourse.  She has declined Pap screening previously and has been felt to be low risk for cervical cancer due to lack of sexual contact.  However, given irregular bleeding we feel more strongly a Pap is indicated as part of evaluation.  [name] is willing to try to have this procedure.  She has talked about with her mom and they went over some educational material so she feels more prepared ? ?They have noticed a rounded skin lesion on her scalp which has been present for 3 to 4 months ?It is itchy, has not healed ?We are not able to get a timely dermatology appointment, as such we decided to do a biopsy to ensure this is not skin cancer ? ?Patient Active Problem List  ? Diagnosis Date Noted  ? Chronic paronychia of finger 01/25/2021  ? S/P left unicompartmental knee replacement, lateral 06/29/2020  ? At risk for obstructive sleep apnea 06/11/2019  ? Cough 06/11/2019  ? Hyperglycemia 05/26/2019  ? Acute hypoxemic respiratory failure ([place]) 05/20/2019  ? Fibromyalgia 03/09/2019  ? Neck pain, chronic 03/09/2019  ? Acute shoulder bursitis, right 02/03/2019  ? Bile salt-induced diarrhea 09/25/2018  ? Arthritis of left acromioclavicular joint 09/23/2018  ? Precordial chest pain 08/11/2018  ? Morbid obesity  ([place]) 10/31/2017  ? S/P right UKR, lateral 10/30/2017  ? Incarcerated epigastric hernia 05/30/2015  ? Left foot pain 08/09/2014  ? Chronic diarrhea 07/27/2014  ? Gout 08/10/2012  ? Acid reflux 01/22/2012  ? Down syndrome 11/11/2011  ? Hernia of abdominal wall 10/21/2011  ? Psoriasis 10/21/2011  ? Type 2 diabetes mellitus, uncontrolled 04/12/2011  ? Hypothyroid 04/12/2011  ? Gait abnormality 06/22/2010  ? Tibial tendinitis, posterior 06/22/2010  ? ? ?Past Medical History:  ?Diagnosis Date  ? Diabetes mellitus   ? type II  ? Down syndrome   ? Family history of adverse reaction to anesthesia   ? mom has had n/v  ? Gastritis   ? Headache   ? Hepatic steatosis   ? Hidradenitis   ? Hypothyroidism   ? Irritable bowel syndrome   ? Periumbilical hernia   ? Pneumonia 03/2020  ? frequent   ? Restless legs   ? Tendonitis   ? chronic in left foot  ? Thyroid disease   ? hypothyroidism  ? ? ?Past Surgical History:  ?Procedure Laterality Date  ? AXILLARY HIDRADENITIS EXCISION Bilateral   ? CHOLECYSTECTOMY    ? HERNIA REPAIR    ? multiple  ? INCISIONAL HERNIA REPAIR  05/30/2015  ? LAPROSCOPIC  ? INCISIONAL HERNIA REPAIR N/A 05/30/2015  ? Procedure: LAPAROSCOPIC INCISIONAL HERNIA;  Surgeon: Rolm Bookbinder, MD;  Location: [facility];  Service: General;  Laterality: N/A;  ? INSERTION OF MESH N/A 05/30/2015  ? Procedure: INSERTION OF MESH;  Surgeon: Rolm Bookbinder, MD;  Location: [facility];  Service: General;  Laterality: N/A;  ? KNEE ARTHROSCOPY    ? right knee  ? LAPAROSCOPY N/A 05/30/2015  ? Procedure: LAPAROSCOPY DIAGNOSTIC;  Surgeon: Rolm Bookbinder, MD;  Location: [facility];  Service: General;  Laterality: N/A;  ? PARTIAL KNEE ARTHROPLASTY Right 10/30/2017  ? Procedure: UNICOMPARTMENTAL RIGHT KNEE LATERAL;  Surgeon: Paralee Cancel, MD;  [facility];  Service: Orthopedics;  Laterality: Right;  90 mins  ? PARTIAL KNEE ARTHROPLASTY Left 06/29/2020  ? Procedure: UNICOMPARTMENTAL KNEE LATERALLY;  Surgeon: Paralee Cancel, MD;  [facility];   Service: Orthopedics;  Laterality: Left;  70 MINS  ? TONSILLECTOMY    ? adenoids  ? ? ?Social History  ? ?Tobacco Use  ? Smoking status: Never  ? Smokeless tobacco: Never  ?Vaping Use  ? Vaping Use: Never used  ?Substance Use Topics  ? Alcohol use: No  ?  Alcohol/week: 0.0 standard drinks  ? Drug use: No  ? ? ?Family History  ?Problem Relation Age of Onset  ? Thyroid cancer Mother   ? Diabetes type II Mother   ? High Cholesterol Mother   ? Heart disease Mother   ? Diabetes type II Father   ? Hypertension Father   ? Stroke Father   ? ? ?Allergies  ?Allergen Reactions  ? Vancomycin Itching  ? Cefuroxime Axetil Hives  ? Sulfa Antibiotics Hives  ? ? ?Medication list has been reviewed and updated. ? ?Current Outpatient Medications on File Prior to Visit  ?Medication Sig Dispense Refill  ? acetaminophen (TYLENOL) 325 MG tablet Take 650 mg by mouth every 6 (six) hours as needed for mild pain.     ? allopurinol (ZYLOPRIM) 300 MG tablet Take 1 tablet (300 mg total) by mouth daily. 90 tablet 1  ? Blood Glucose Monitoring Suppl (BLOOD GLUCOSE METER KIT AND SUPPLIES) Dispense based on patient and insurance preference. To check blood sugars daily FOR ICD-9 250.00 (Patient taking differently: 1 each by Other route See admin instructions. Dispense based on patient and insurance preference. To check blood sugars daily FOR ICD-9 250.00) 1 each 0  ? Blood Glucose Monitoring Suppl (TRUE METRIX METER) w/Device KIT Use as directed to check glucose up to two times daily. Dx Code E11.9 1 kit 0  ? cholestyramine (QUESTRAN) 4 g packet DISSOLVE AND TAKE 1 PACKET OF POWDER BY MOUTH TWICE DAILY 180 each 3  ? dicyclomine (BENTYL) 20 MG tablet Take 20 mg by mouth daily as needed for spasms.    ? empagliflozin (JARDIANCE) 25 MG TABS tablet Take 25 mg by mouth daily.    ? Glucose Blood (BLOOD GLUCOSE TEST STRIPS) STRP 1 each by Other route See admin instructions. Use to test blood sugar up to twice a day 100 strip 3  ? glucose blood (FREESTYLE  TEST STRIPS) test strip Use as directed to test blood sugar up to twice a day. Dx code E11.9 100 each 12  ? glucose blood (TRUE METRIX BLOOD GLUCOSE TEST) test strip Use as instructed to check glucose up to 2 times daily. Dx Code E11.9 100 each 12  ? levonorgestrel-ethinyl estradiol (JOLESSA) 0.15-0.03 MG tablet Take 1 tablet by mouth once daily 84 tablet 3  ? levothyroxine (SYNTHROID) 150 MCG tablet Take 1 tablet (150 mcg total) by mouth daily before breakfast. 30 tablet 3  ? metFORMIN (GLUCOPHAGE) 1000 MG tablet Take  1 tablet (1,000 mg total) by mouth 2 (two) times daily. 180 tablet 2  ? methocarbamol (ROBAXIN) 500 MG tablet Take 1 tablet (500 mg total) by mouth every 6 (six) hours as needed for muscle spasms. 40 tablet 0  ? nystatin (MYCOSTATIN/NYSTOP) powder Apply 1 application. topically 3 (three) times daily. 60 g 1  ? ONE TOUCH ULTRA TEST test strip USE TO CHECK BLOOD SUGAR ONCE DAILY (ICD CODE:25.00) (Patient taking differently: 1 each by Other route daily.) 100 each 3  ? tirzepatide (MOUNJARO) 7.5 MG/0.5ML Pen 7.9m    ? TRUEplus Lancets 30G MISC Use as directed to check glucose up to two times daily. Dx code E11.9 100 each 3  ? ?No current facility-administered medications on file prior to visit.  ? ? ?Review of Systems: ? ?As per HPI- otherwise negative. ? ? ?Physical Examination: ?Vitals:  ? 05/30/21 1451  ?BP: 120/80  ?Pulse: 78  ?SpO2: 98%  ? ?There were no vitals filed for this visit. ?There is no height or weight on file to calculate BMI. ?Ideal Body Weight:   ? ?GEN: No acute distress; alert,appropriate.  Appears her normal self ?PULM: Breathing comfortably in no respiratory distress ?PSYCH: Normally interactive.  ?There is an approximately 10 mm diameter scaly lesion on scalp with associated discrete hair loss ? ?External genitals are normal.  Gently placed small size speculum and attempted to visualize cervix.  Patient not able to tolerate opening speculum to get a complete view of cervix.  I  obtained the best specimen possible, then stopped procedure-patient indicated pain and asked uKoreato stop ?Did not attempt bimanual exam due to patient distress ? ? ?VC obtained from pt and parent ?Cleaned ar

## 2021-05-29 ENCOUNTER — Ambulatory Visit: Payer: Medicare HMO | Admitting: Physical Therapy

## 2021-05-29 ENCOUNTER — Encounter: Payer: Self-pay | Admitting: Physical Therapy

## 2021-05-29 DIAGNOSIS — R296 Repeated falls: Secondary | ICD-10-CM | POA: Diagnosis not present

## 2021-05-29 DIAGNOSIS — M545 Low back pain, unspecified: Secondary | ICD-10-CM

## 2021-05-29 DIAGNOSIS — R262 Difficulty in walking, not elsewhere classified: Secondary | ICD-10-CM

## 2021-05-29 DIAGNOSIS — R600 Localized edema: Secondary | ICD-10-CM | POA: Diagnosis not present

## 2021-05-29 DIAGNOSIS — M25562 Pain in left knee: Secondary | ICD-10-CM

## 2021-05-29 NOTE — Therapy (Signed)
[facility] ?[place] [address] ?[place], [place], [address] ?Phone: [phone]   Fax:  [phone] ? ?Physical Therapy Treatment ? ?Patient Details  ?Name: Wanda Oneill ?MRN: [id number] ?Date of Birth: 1980-08-26 ?Referring Provider (PT): Alvan Dame ? ? ?Encounter Date: 05/29/2021 ? ? PT End of Session - 05/29/21 1749   ? ? Visit Number 5   ? Number of Visits 13   ? Date for PT Re-Evaluation 07/30/21   ? Authorization Type Hum[employer]   ? PT Start Time 9470   ? PT Stop Time 9628   ? PT Time Calculation (min) 48 min   ? Activity Tolerance Patient tolerated treatment well   ? Behavior During Therapy [facility] for tasks assessed/performed   ? ?  ?  ? ?  ? ? ?Past Medical History:  ?Diagnosis Date  ? Diabetes mellitus   ? type II  ? Down syndrome   ? Family history of adverse reaction to anesthesia   ? mom has had n/v  ? Gastritis   ? Headache   ? Hepatic steatosis   ? Hidradenitis   ? Hypothyroidism   ? Irritable bowel syndrome   ? Periumbilical hernia   ? Pneumonia 03/2020  ? frequent   ? Restless legs   ? Tendonitis   ? chronic in left foot  ? Thyroid disease   ? hypothyroidism  ? ? ?Past Surgical History:  ?Procedure Laterality Date  ? AXILLARY HIDRADENITIS EXCISION Bilateral   ? CHOLECYSTECTOMY    ? HERNIA REPAIR    ? multiple  ? INCISIONAL HERNIA REPAIR  05/30/2015  ? LAPROSCOPIC  ? INCISIONAL HERNIA REPAIR N/A 05/30/2015  ? Procedure: LAPAROSCOPIC INCISIONAL HERNIA;  Surgeon: Rolm Bookbinder, MD;  Location: [facility];  Service: General;  Laterality: N/A;  ? INSERTION OF MESH N/A 05/30/2015  ? Procedure: INSERTION OF MESH;  Surgeon: Rolm Bookbinder, MD;  Location: [facility];  Service: General;  Laterality: N/A;  ? KNEE ARTHROSCOPY    ? right knee  ? LAPAROSCOPY N/A 05/30/2015  ? Procedure: LAPAROSCOPY DIAGNOSTIC;  Surgeon: Rolm Bookbinder, MD;  Location: [facility];  Service: General;  Laterality: N/A;  ? PARTIAL KNEE ARTHROPLASTY Right 10/30/2017  ? Procedure: UNICOMPARTMENTAL RIGHT KNEE  LATERAL;  Surgeon: Paralee Cancel, MD;  [facility];  Service: Orthopedics;  Laterality: Right;  90 mins  ? PARTIAL KNEE ARTHROPLASTY Left 06/29/2020  ? Procedure: UNICOMPARTMENTAL KNEE LATERALLY;  Surgeon: Paralee Cancel, MD;  [facility];  Service: Orthopedics;  Laterality: Left;  70 MINS  ? TONSILLECTOMY    ? adenoids  ? ? ?There were no vitals filed for this visit. ? ? Subjective Assessment - 05/29/21 1716   ? ? Subjective Doing okay a little sore   ? Currently in Pain? Yes   ? Pain Score 2    ? Pain Location Knee   ? Pain Descriptors / Indicators Sore   ? Aggravating Factors  stairs   ? ?  ?  ? ?  ? ? ? ? ? ? ? ? ? ? ? ? ? ? ? ? ? ? ? ? [facility] Adult PT Treatment/Exercise - 05/29/21 0001   ? ?  ? Ambulation/Gait  ? Gait Comments outside curbs needs CGA, on pavement but a slope some supervision, tried walking in grass, she really would not do it without CGA and seemed to lean on me some as she was afraid and unsure, she was very slow in the grass, stairs step over  step with 4" and 6" steps, tried using both handrails and then tried one hand rail right then left, she really will not go down the stairs with one due to fear. Gait in clinic without device two laps   ?  ? Lumbar Exercises: Machines for Strengthening  ? Cybex Knee Extension 5# 2x10   ? [employer]bex Knee Flexion 20# 2x10   ?  ? Knee/Hip Exercises: Aerobic  ? Nustep L 6 6 min - LE only   ? ?  ?  ? ?  ? ? ? ? ? ? ? ? ? ? ? ? PT Short Term Goals - 05/17/21 1649   ? ?  ? PT SHORT TERM GOAL #1  ? Title independent with initial HEP   ? Status Achieved   ? ?  ?  ? ?  ? ? ? ? PT Long Term Goals - 05/29/21 1753   ? ?  ? PT LONG TERM GOAL #1  ? Title increase BERG to 45/56   ? Status On-going   ?  ? PT LONG TERM GOAL #2  ? Title dcrease pain 50%   ? Status Partially Met   ?  ? PT LONG TERM GOAL #3  ? Title increase left knee AROM to 0-110 degrees flexion   ? Status Achieved   ? ?  ?  ? ?  ? ? ? ? ? ? ? ? Plan - 05/29/21 1749   ? ? Clinical Impression Statement I  focused treatment on gait, she reports that she will go to the beach in over a month, we walked outside with [facility], slow but reported feeling safe with this on the asphalt, going up curbs she did well, coming down she would not do without HHA/CGA.  I had her walk in the grass and she could not do without HHA, and went very slow, was very fearful of this.  We practiced stairs and with two hand rails she will do step over step up and down.  With one hand rail will go up step over step, will not go down the stairs with one hand rail only requires two or requires one plus HHA   ? PT Next Visit Plan work on her confidence with walking on uneven terrain and on stairs   ? Consulted and Agree with Plan of Care Patient;Family member/caregiver   ? Family Member Consulted mom   ? ?  ?  ? ?  ? ? ?Patient will benefit from skilled therapeutic intervention in order to improve the following deficits and impairments:  Abnormal gait, Decreased range of motion, Difficulty walking, Decreased endurance, Decreased activity tolerance, Pain, Decreased balance, Decreased strength, Decreased mobility ? ?Visit Diagnosis: ?Difficulty in walking, not elsewhere classified ? ?Repeated falls ? ?Localized edema ? ?Acute pain of left knee ? ?Acute bilateral low back pain without sciatica ? ? ? ? ?Problem List ?Patient Active Problem List  ? Diagnosis Date Noted  ? Chronic paronychia of finger 01/25/2021  ? S/P left unicompartmental knee replacement, lateral 06/29/2020  ? At risk for obstructive sleep apnea 06/11/2019  ? Cough 06/11/2019  ? Hyperglycemia 05/26/2019  ? Acute hypoxemic respiratory failure ([place]) 05/20/2019  ? Fibromyalgia 03/09/2019  ? Neck pain, chronic 03/09/2019  ? Acute shoulder bursitis, right 02/03/2019  ? Bile salt-induced diarrhea 09/25/2018  ? Arthritis of left acromioclavicular joint 09/23/2018  ? Precordial chest pain 08/11/2018  ? Morbid obesity ([facility]) 10/31/2017  ? S/P right UKR, lateral 10/30/2017  ? Incarcerated epigastric  hernia 05/30/2015  ? Left foot pain 08/09/2014  ? Chronic diarrhea 07/27/2014  ? Gout 08/10/2012  ? Acid reflux 01/22/2012  ? Down syndrome 11/11/2011  ? Hernia of abdominal wall 10/21/2011  ? Psoriasis 10/21/2011  ? Type 2 diabetes mellitus, uncontrolled 04/12/2011  ? Hypothyroid 04/12/2011  ? Gait abnormality 06/22/2010  ? Tibial tendinitis, posterior 06/22/2010  ? ? Sumner Boast, PT ?05/29/2021, 5:54 PM ? ?[facility] ?[place] [address] ?[place], [place], [address] ?Phone: [phone]   Fax:  [phone] ? ?Name: Wanda Oneill ?MRN: [id number] ?Date of Birth: [birth year] ? ? ? ?

## 2021-05-30 ENCOUNTER — Other Ambulatory Visit (HOSPITAL_COMMUNITY)
Admission: RE | Admit: 2021-05-30 | Discharge: 2021-05-30 | Disposition: A | Discharge status: Home / Self Care | Payer: Medicare HMO | Source: Ambulatory Visit | Attending: Family Medicine | Admitting: Family Medicine

## 2021-05-30 ENCOUNTER — Ambulatory Visit (INDEPENDENT_AMBULATORY_CARE_PROVIDER_SITE_OTHER): Payer: Medicare HMO | Admitting: Family Medicine

## 2021-05-30 VITALS — BP 120/80 | HR 78

## 2021-05-30 DIAGNOSIS — D489 Neoplasm of uncertain behavior, unspecified: Secondary | ICD-10-CM | POA: Diagnosis not present

## 2021-05-30 DIAGNOSIS — Z1151 Encounter for screening for human papillomavirus (HPV): Secondary | ICD-10-CM | POA: Insufficient documentation

## 2021-05-30 DIAGNOSIS — L989 Disorder of the skin and subcutaneous tissue, unspecified: Secondary | ICD-10-CM

## 2021-05-30 DIAGNOSIS — Z01419 Encounter for gynecological examination (general) (routine) without abnormal findings: Secondary | ICD-10-CM | POA: Diagnosis not present

## 2021-05-30 DIAGNOSIS — Z124 Encounter for screening for malignant neoplasm of cervix: Secondary | ICD-10-CM | POA: Diagnosis not present

## 2021-05-30 DIAGNOSIS — N926 Irregular menstruation, unspecified: Secondary | ICD-10-CM

## 2021-05-30 NOTE — Patient Instructions (Signed)
It was good to see you today ?I am going to have you see GYN for further evaluation of your abnormal bleeding ?You were very brave and we will see what our pap sample shows  ?Punch biopsy taken from your scalp today- I will be in touch with that report as well ?Leave dry until tomorrow- then ok to shower as usual ?If any sign of infection- redness, pain, discharge or heat- please let me know right away  ?

## 2021-05-31 ENCOUNTER — Telehealth (HOSPITAL_BASED_OUTPATIENT_CLINIC_OR_DEPARTMENT_OTHER): Payer: Self-pay | Admitting: Obstetrics & Gynecology

## 2021-05-31 NOTE — Telephone Encounter (Signed)
Called the patient mom and left a message today at 9:00 am to call the office .Called the patient mom again today 3:13 pm and left a detail message to please call the office . ?

## 2021-06-04 LAB — CYTOLOGY - PAP
Adequacy: ABSENT
Comment: NEGATIVE
Diagnosis: NEGATIVE
Diagnosis: REACTIVE
High risk HPV: NEGATIVE

## 2021-06-05 ENCOUNTER — Encounter: Payer: Self-pay | Admitting: Physical Therapy

## 2021-06-05 ENCOUNTER — Ambulatory Visit: Payer: Medicare HMO | Attending: Orthopedic Surgery | Admitting: Physical Therapy

## 2021-06-05 DIAGNOSIS — R296 Repeated falls: Secondary | ICD-10-CM | POA: Diagnosis not present

## 2021-06-05 DIAGNOSIS — M25662 Stiffness of left knee, not elsewhere classified: Secondary | ICD-10-CM | POA: Diagnosis not present

## 2021-06-05 DIAGNOSIS — R262 Difficulty in walking, not elsewhere classified: Secondary | ICD-10-CM | POA: Insufficient documentation

## 2021-06-05 DIAGNOSIS — M25562 Pain in left knee: Secondary | ICD-10-CM | POA: Insufficient documentation

## 2021-06-05 DIAGNOSIS — M545 Low back pain, unspecified: Secondary | ICD-10-CM | POA: Insufficient documentation

## 2021-06-05 DIAGNOSIS — R600 Localized edema: Secondary | ICD-10-CM | POA: Diagnosis not present

## 2021-06-05 NOTE — Therapy (Signed)
[place] ?[place] [address] ?[place], [place], [address] ?Phone: [phone]   Fax:  [phone] ? ?Physical Therapy Treatment ? ?Patient Details  ?Name: Wanda Oneill ?MRN: [id number] ?Date of Birth: Jun 01, 1980 ?Referring Provider (PT): Alvan Dame ? ? ?Encounter Date: 06/05/2021 ? ? PT End of Session - 06/05/21 1644   ? ? Visit Number 6   ? Number of Visits 13   ? Date for PT Re-Evaluation 07/30/21   ? Authorization Type [employer]   ? PT Start Time 1224   ? PT Stop Time 1700   ? PT Time Calculation (min) 55 min   ? Activity Tolerance Patient tolerated treatment well   ? Behavior During Therapy [facility] for tasks assessed/performed   ? ?  ?  ? ?  ? ? ?Past Medical History:  ?Diagnosis Date  ? Diabetes mellitus   ? type II  ? Down syndrome   ? Family history of adverse reaction to anesthesia   ? mom has had n/v  ? Gastritis   ? Headache   ? Hepatic steatosis   ? Hidradenitis   ? Hypothyroidism   ? Irritable bowel syndrome   ? Periumbilical hernia   ? Pneumonia 03/2020  ? frequent   ? Restless legs   ? Tendonitis   ? chronic in left foot  ? Thyroid disease   ? hypothyroidism  ? ? ?Past Surgical History:  ?Procedure Laterality Date  ? AXILLARY HIDRADENITIS EXCISION Bilateral   ? CHOLECYSTECTOMY    ? HERNIA REPAIR    ? multiple  ? INCISIONAL HERNIA REPAIR  05/30/2015  ? LAPROSCOPIC  ? INCISIONAL HERNIA REPAIR N/A 05/30/2015  ? Procedure: LAPAROSCOPIC INCISIONAL HERNIA;  Surgeon: Rolm Bookbinder, MD;  Location: [place];  Service: General;  Laterality: N/A;  ? INSERTION OF MESH N/A 05/30/2015  ? Procedure: INSERTION OF MESH;  Surgeon: Rolm Bookbinder, MD;  Location: [place];  Service: General;  Laterality: N/A;  ? KNEE ARTHROSCOPY    ? right knee  ? LAPAROSCOPY N/A 05/30/2015  ? Procedure: LAPAROSCOPY DIAGNOSTIC;  Surgeon: Rolm Bookbinder, MD;  Location: [place];  Service: General;  Laterality: N/A;  ? PARTIAL KNEE ARTHROPLASTY Right 10/30/2017  ? Procedure: UNICOMPARTMENTAL RIGHT KNEE  LATERAL;  Surgeon: Paralee Cancel, MD;  [facility];  Service: Orthopedics;  Laterality: Right;  90 mins  ? PARTIAL KNEE ARTHROPLASTY Left 06/29/2020  ? Procedure: UNICOMPARTMENTAL KNEE LATERALLY;  Surgeon: Paralee Cancel, MD;  [facility];  Service: Orthopedics;  Laterality: Left;  70 MINS  ? TONSILLECTOMY    ? adenoids  ? ? ?There were no vitals filed for this visit. ? ? Subjective Assessment - 06/05/21 1608   ? ? Subjective my back is sore with walking   ? Currently in Pain? Yes   ? Pain Score 5    ? Pain Location Back   ? Pain Orientation Lower   ? Pain Descriptors / Indicators Sore   ? Aggravating Factors  walking   ? ?  ?  ? ?  ? ? ? ? ? ? ? ? ? ? ? ? ? ? ? ? ? ? ? ? [place] Adult PT Treatment/Exercise - 06/05/21 0001   ? ?  ? Transfers  ? Comments patient has nieces coming to visit in the next month, she asked about getting up from the floor and down to the floor to play with them, we tried down to high kneeling and up and then all the way  down on to her backside and then back up, min A needed   ?  ? Ambulation/Gait  ? Gait Comments outside curbs with CGA, very slow on the light down hill slopes, seems to go faster going up hill, reports that she feels unsteady going down the hill, stairs step over step with help 4" and 6', gait with out device x 200 feet in the clinic   ?  ? Knee/Hip Exercises: Machines for Strengthening  ? Cybex Knee Extension 5# 2x10   ? Cybex Knee Flexion 20# 2x10   ? ?  ?  ? ?  ? ? ? ? ? ? ? ? ? ? ? ? PT Short Term Goals - 05/17/21 1649   ? ?  ? PT SHORT TERM GOAL #1  ? Title independent with initial HEP   ? Status Achieved   ? ?  ?  ? ?  ? ? ? ? PT Long Term Goals - 06/05/21 1646   ? ?  ? PT LONG TERM GOAL #1  ? Title increase BERG to 45/56   ? Status On-going   ?  ? PT LONG TERM GOAL #2  ? Title dcrease pain 50%   ? Status On-going   ?  ? PT LONG TERM GOAL #3  ? Title increase left knee AROM to 0-110 degrees flexion   ? Status Achieved   ? ?  ?  ? ?  ? ? ? ? ? ? ? ? Plan - 06/05/21  1645   ? ? Clinical Impression Statement We walked outside and had her negotiate the curbs, still wants and needs the CGA for up and down, going down slopes she is unsure of her self and wants CGA, going up she is faster and is on her own with supervision.  we worked on transfers down onto the floor and then up from the floor this was difficult and tiring for her but was anbe to do with min A   ? PT Next Visit Plan work on her confidence with walking on uneven terrain and on stairs   ? Consulted and Agree with Plan of Care Patient   ? ?  ?  ? ?  ? ? ?Patient will benefit from skilled therapeutic intervention in order to improve the following deficits and impairments:  Abnormal gait, Decreased range of motion, Difficulty walking, Decreased endurance, Decreased activity tolerance, Pain, Decreased balance, Decreased strength, Decreased mobility ? ?Visit Diagnosis: ?Difficulty in walking, not elsewhere classified ? ?Repeated falls ? ?Localized edema ? ?Acute pain of left knee ? ?Acute bilateral low back pain without sciatica ? ? ? ? ?Problem List ?Patient Active Problem List  ? Diagnosis Date Noted  ? Chronic paronychia of finger 01/25/2021  ? S/P left unicompartmental knee replacement, lateral 06/29/2020  ? At risk for obstructive sleep apnea 06/11/2019  ? Cough 06/11/2019  ? Hyperglycemia 05/26/2019  ? Acute hypoxemic respiratory failure ([place]) 05/20/2019  ? Fibromyalgia 03/09/2019  ? Neck pain, chronic 03/09/2019  ? Acute shoulder bursitis, right 02/03/2019  ? Bile salt-induced diarrhea 09/25/2018  ? Arthritis of left acromioclavicular joint 09/23/2018  ? Precordial chest pain 08/11/2018  ? Morbid obesity ([place]) 10/31/2017  ? S/P right UKR, lateral 10/30/2017  ? Incarcerated epigastric hernia 05/30/2015  ? Left foot pain 08/09/2014  ? Chronic diarrhea 07/27/2014  ? Gout 08/10/2012  ? Acid reflux 01/22/2012  ? Down syndrome 11/11/2011  ? Hernia of abdominal wall 10/21/2011  ? Psoriasis 10/21/2011  ? Type 2  diabetes  mellitus, uncontrolled 04/12/2011  ? Hypothyroid 04/12/2011  ? Gait abnormality 06/22/2010  ? Tibial tendinitis, posterior 06/22/2010  ? ? Sumner Boast, PT ?06/05/2021, 4:47 PM ? ?[place] ?[address] ?[place], [place], [address] ?Phone: [phone]   Fax:  [phone] ? ?Name: Wanda Oneill ?MRN: [id number] ?Date of Birth: 03-19-1980 ? ? ? ?

## 2021-06-09 IMAGING — DX DG CHEST 1V PORT
1 series · 2 of 2 positions shown · non-contrast
Comparison: 02/10/2016

CLINICAL DATA: Shortness of breath

EXAM:
PORTABLE CHEST 1 VIEW

[Series 1: chest ap · 0.14mm/px · 2 of 2 slices shown]
[im 1/2]
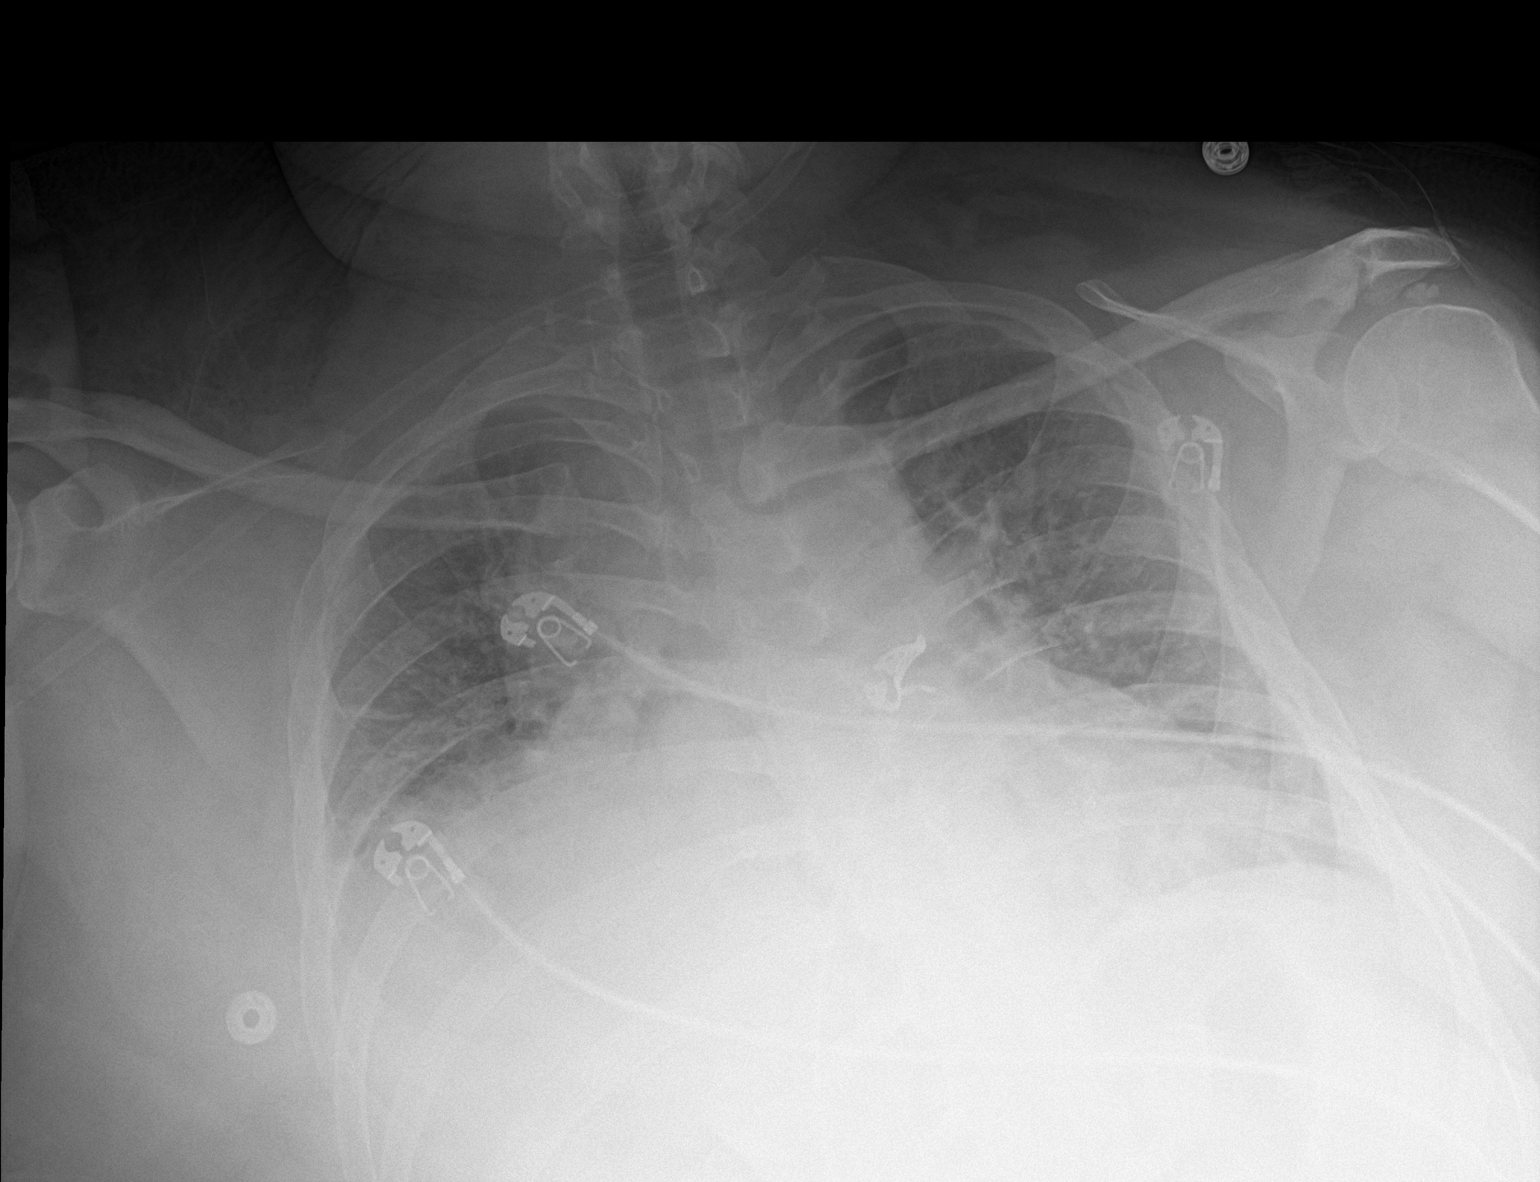
[im 2/2]
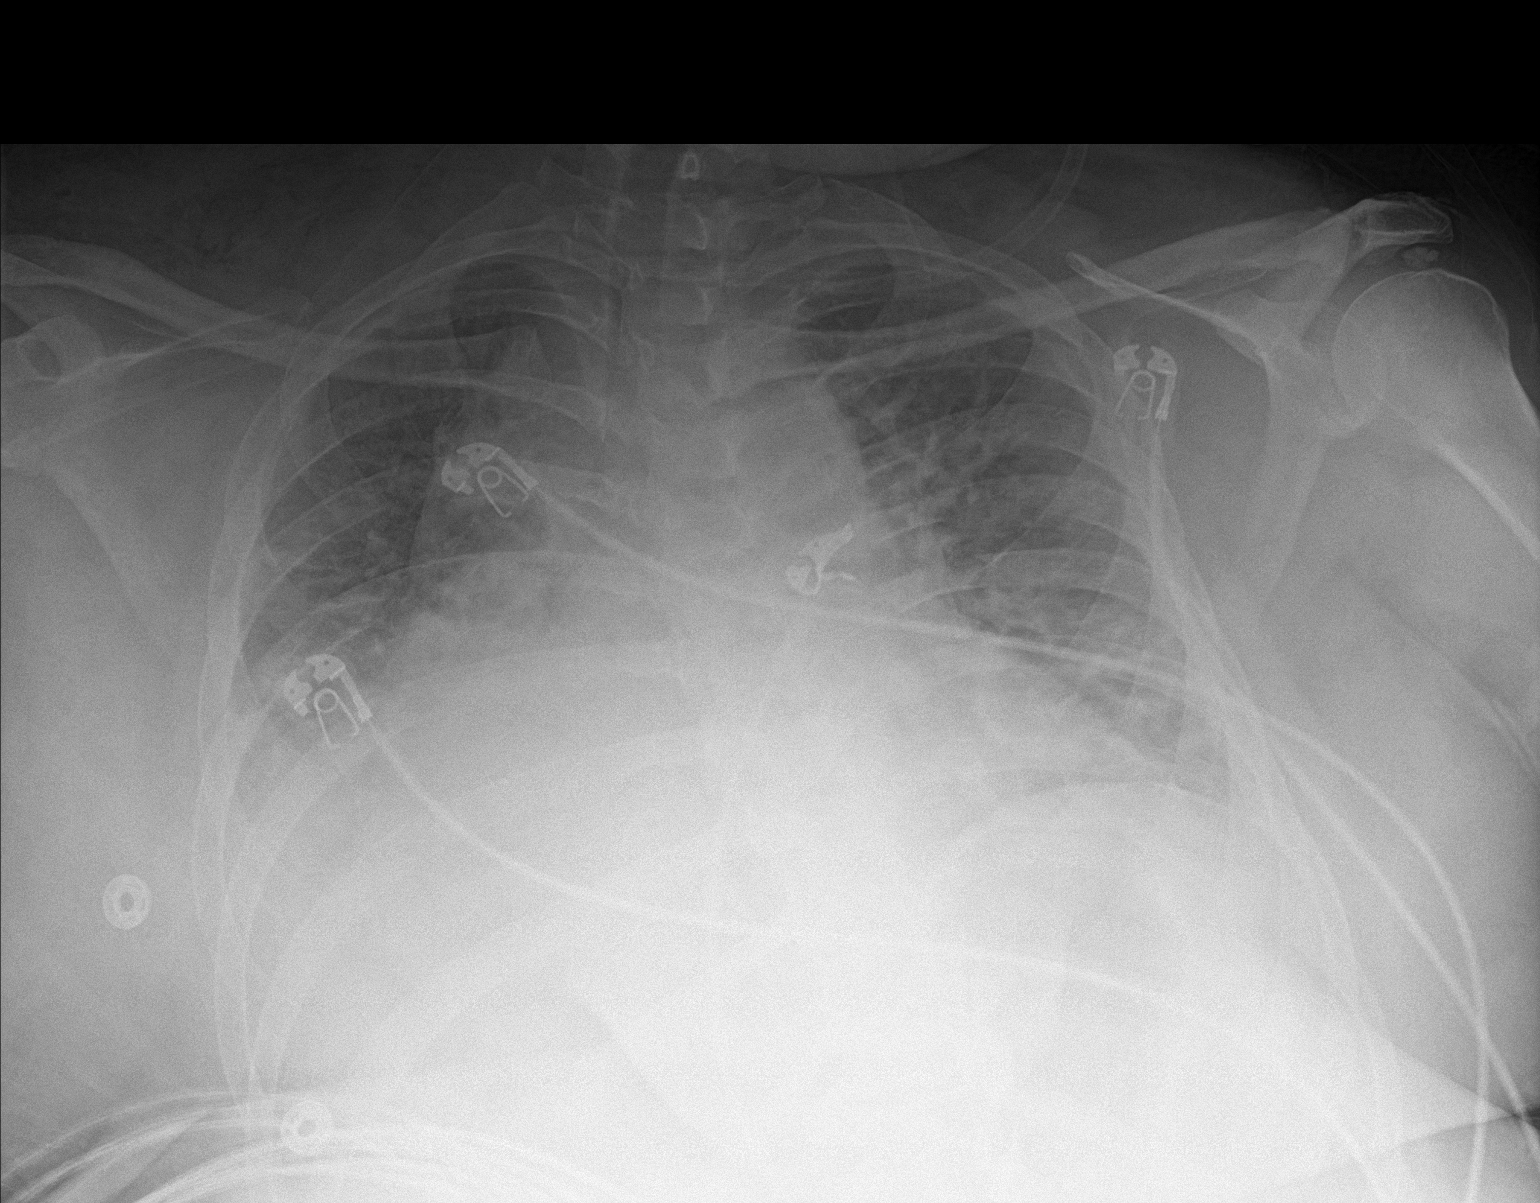

[2 of 2 positions shown; findings below may reference images not displayed]

FINDINGS: Markedly low lung volumes. Elevation of the right diaphragm.
Enlarged cardiomediastinal silhouette with vascular congestion and
perihilar interstitial opacities, possible edema. There may be a
small right-sided pleural effusion.
IMPRESSION: 1. Marked hypoventilatory change with elevated right diaphragm.
2. Enlarged cardiomediastinal silhouette compared to prior, likely
augmented by low lung volume and portable technique. There does
appear to be vascular congestion and mild perihilar edema. Suspect
that there may be a small right-sided pleural effusion.

## 2021-06-10 IMAGING — DX DG CHEST 1V PORT
1 series · 1 of 1 positions shown · non-contrast
Comparison: May 20, 2019.

CLINICAL DATA: Hypoxia

EXAM:
PORTABLE CHEST 1 VIEW

[chest ap]
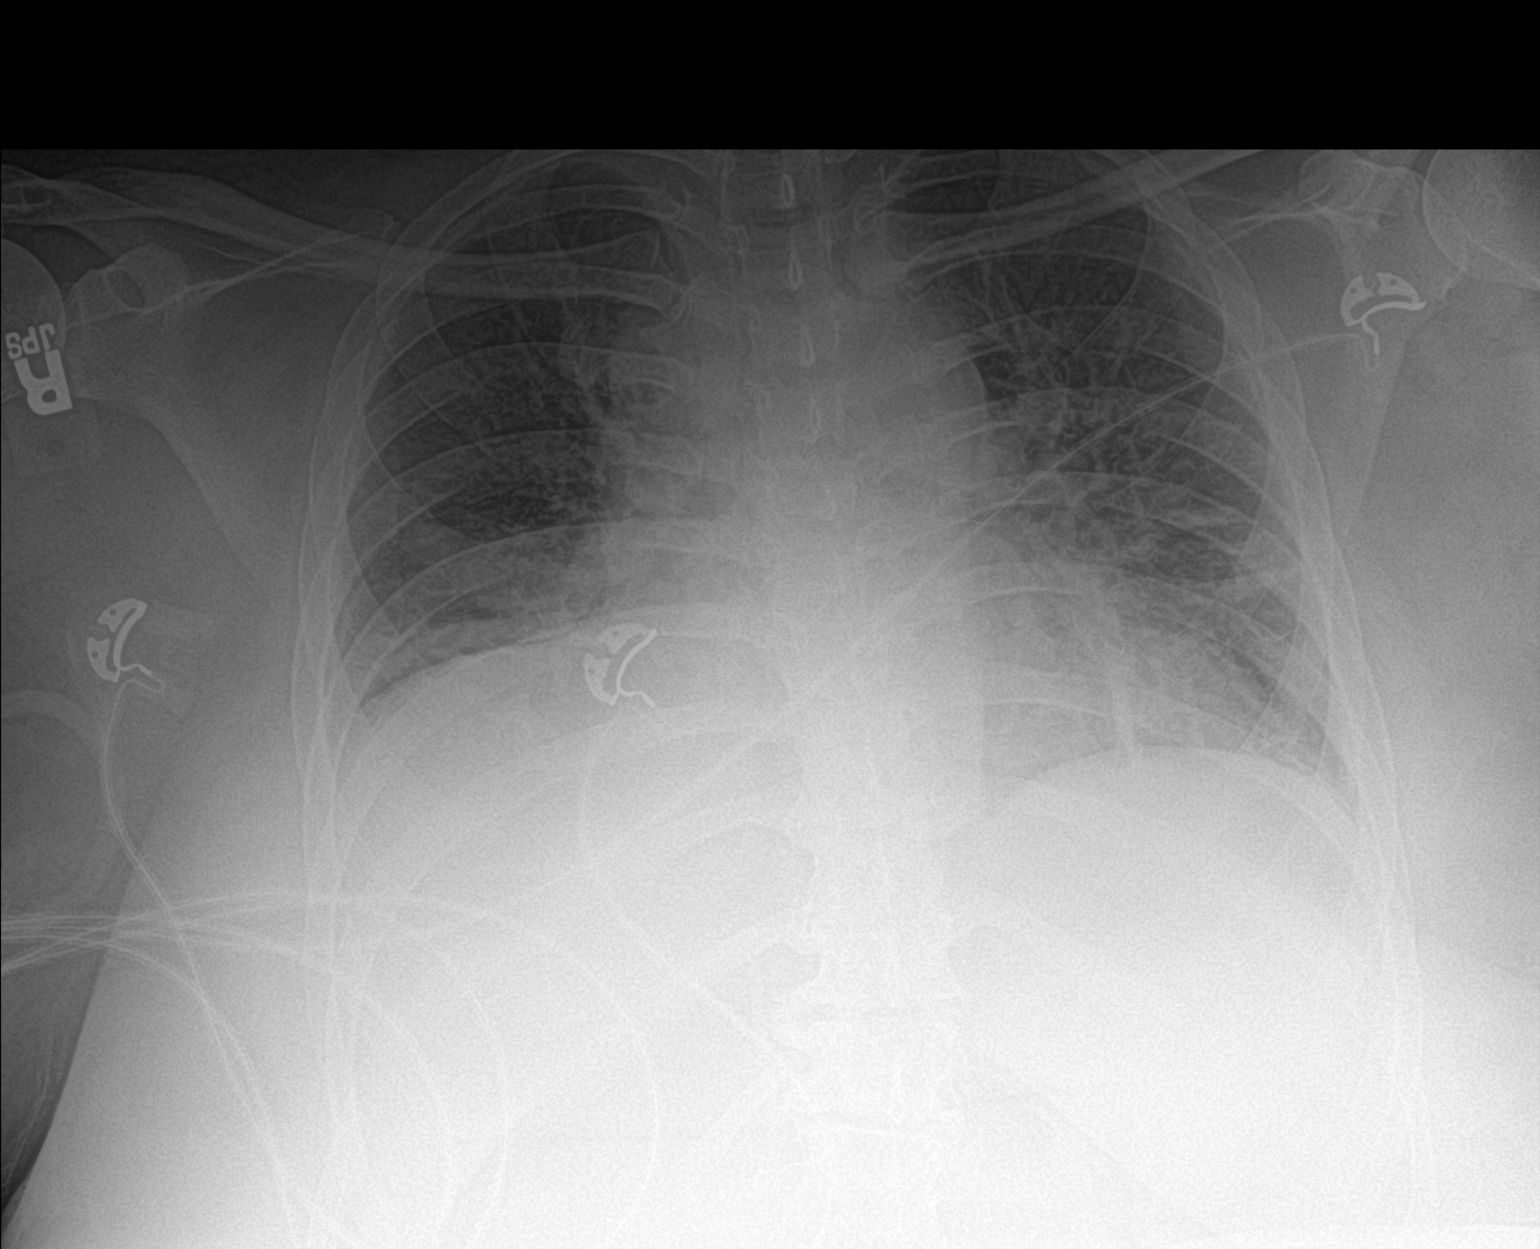

[1 of 1 positions shown; findings below may reference images not displayed]

FINDINGS: There is stable relatively mild elevation of the right
hemidiaphragm. There is atelectatic change in both lower lung
regions. The lungs elsewhere are clear. Heart is mildly enlarged
with pulmonary vascularity normal. No adenopathy. No bone lesions.
IMPRESSION: Bilateral lower lung atelectatic change. No frank consolidation.
Stable cardiac prominence. Stable elevation right hemidiaphragm.

## 2021-06-11 IMAGING — DX DG CHEST 1V PORT
1 series · 1 of 1 positions shown · non-contrast
Comparison: May 21, 2019.

CLINICAL DATA: Acute respiratory failure.

EXAM:
PORTABLE CHEST 1 VIEW

[chest ap]
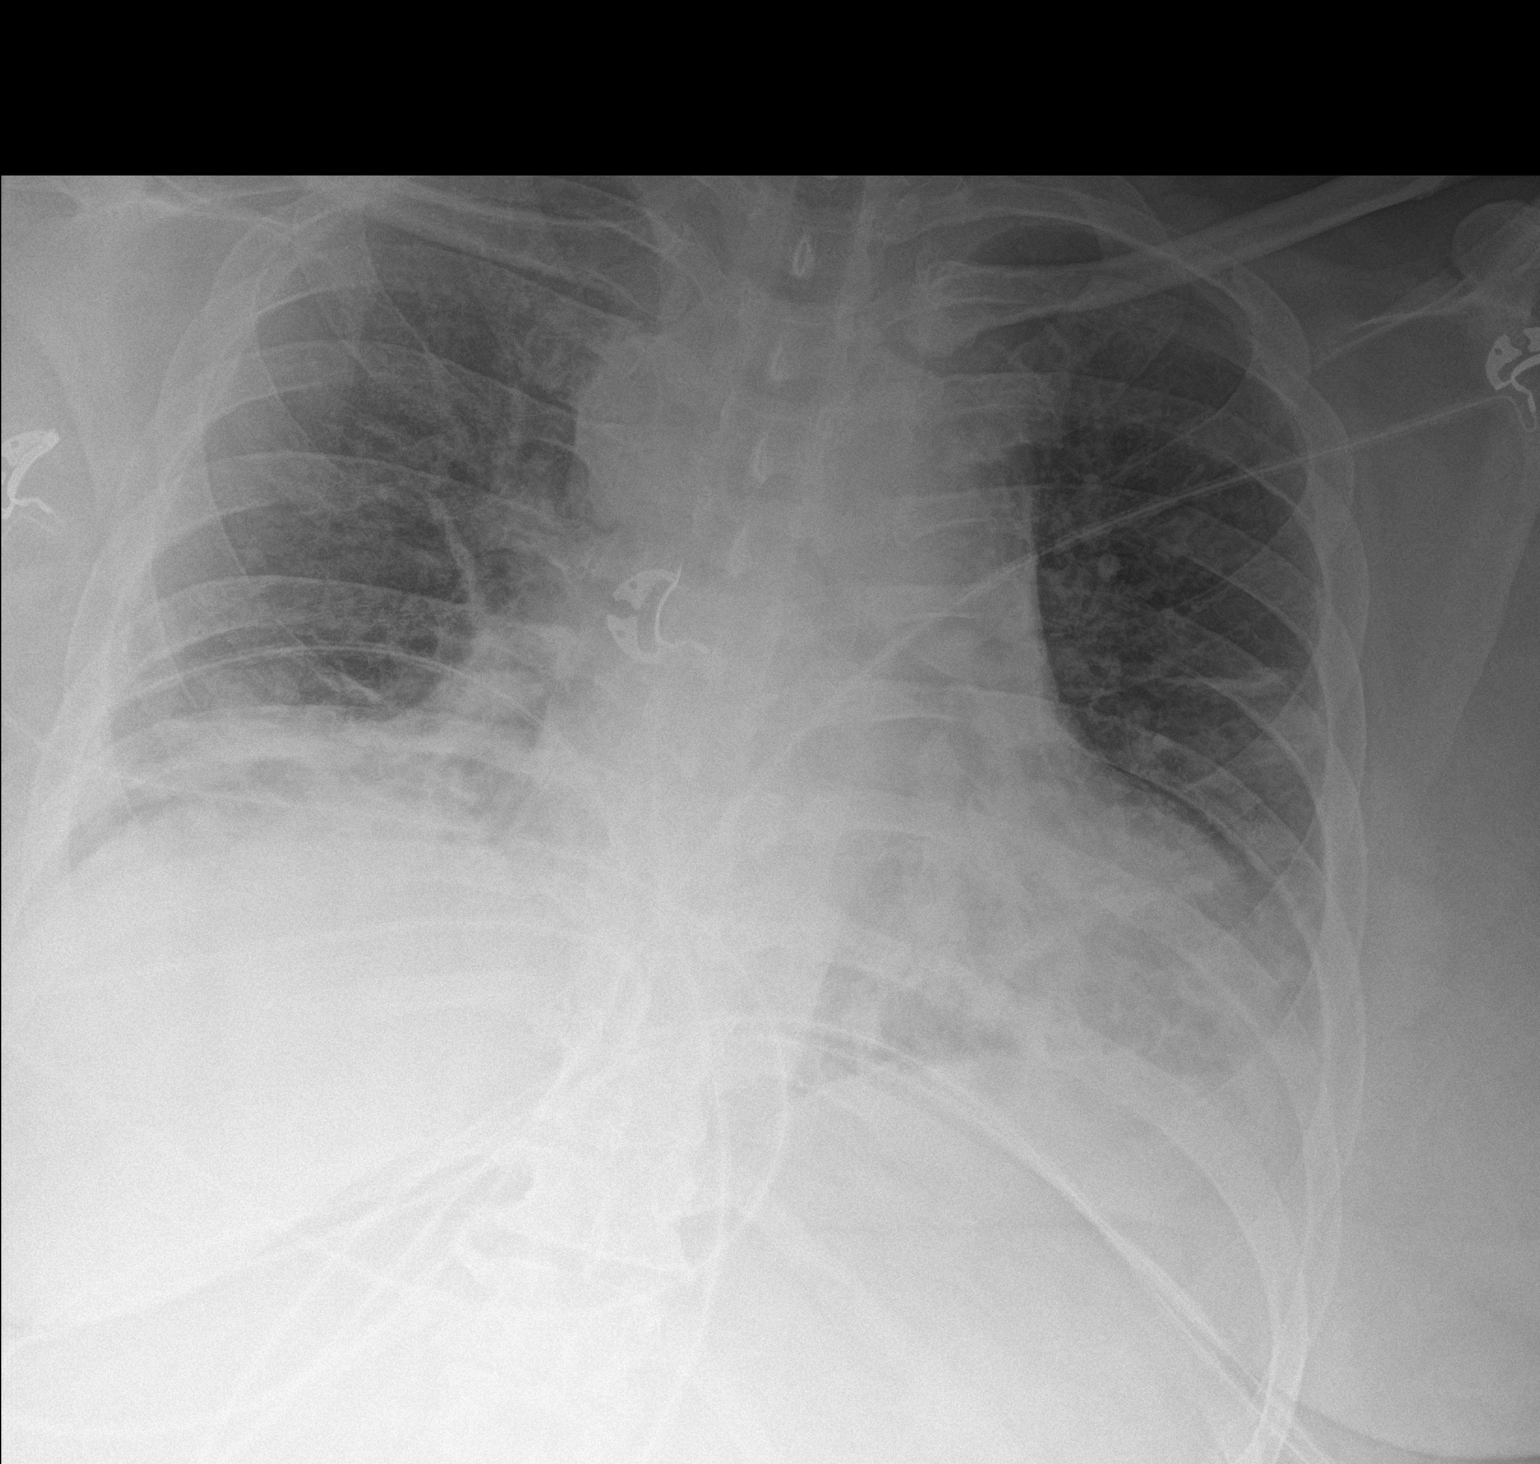

[1 of 1 positions shown; findings below may reference images not displayed]

FINDINGS: Stable cardiomegaly. No pneumothorax is noted. Increased bibasilar
atelectasis or infiltrates are noted with associated pleural
effusions. Bony thorax is unremarkable.
IMPRESSION: Increased bibasilar atelectasis or infiltrates are noted with
associated pleural effusions.

## 2021-06-13 ENCOUNTER — Other Ambulatory Visit: Payer: Self-pay | Admitting: Family Medicine

## 2021-06-13 DIAGNOSIS — C4490 Unspecified malignant neoplasm of skin, unspecified: Secondary | ICD-10-CM

## 2021-06-13 NOTE — Progress Notes (Signed)
Called and was able to get her an appt with [facility] for 5/24 at 10:40 am ?Will fax records. Her mom is aware ? ?[phone] ?Fax: [phone] ? ?ADDRESS ?[address] ?[place],  [address] ?

## 2021-06-14 ENCOUNTER — Ambulatory Visit: Payer: Medicare HMO | Admitting: Physical Therapy

## 2021-06-14 DIAGNOSIS — E039 Hypothyroidism, unspecified: Secondary | ICD-10-CM | POA: Diagnosis not present

## 2021-06-14 DIAGNOSIS — E010 Iodine-deficiency related diffuse (endemic) goiter: Secondary | ICD-10-CM | POA: Diagnosis not present

## 2021-06-14 DIAGNOSIS — E781 Pure hyperglyceridemia: Secondary | ICD-10-CM | POA: Diagnosis not present

## 2021-06-14 DIAGNOSIS — E1165 Type 2 diabetes mellitus with hyperglycemia: Secondary | ICD-10-CM | POA: Diagnosis not present

## 2021-06-18 ENCOUNTER — Ambulatory Visit (INDEPENDENT_AMBULATORY_CARE_PROVIDER_SITE_OTHER): Payer: Medicare HMO | Admitting: Sports Medicine

## 2021-06-18 ENCOUNTER — Ambulatory Visit (INDEPENDENT_AMBULATORY_CARE_PROVIDER_SITE_OTHER): Payer: Medicare HMO

## 2021-06-18 DIAGNOSIS — M65312 Trigger thumb, left thumb: Secondary | ICD-10-CM

## 2021-06-18 DIAGNOSIS — M65311 Trigger thumb, right thumb: Secondary | ICD-10-CM | POA: Diagnosis not present

## 2021-06-18 NOTE — Assessment & Plan Note (Signed)
This is a very pleasant 41 year old female, she has had a history of pain and popping volar thumbs bilaterally. ?She also has some numbness and tingling in her long fingers, occasionally second and thumb. ?On exam she has palpable flexor tendon nodule at the flexor pollicis longus consistent with a trigger thumb. ?In addition she has a negative Phalen's and Tinel signs. ?We will defer the investigation of the numbness and tingling until the follow-up visit. ?Differential includes carpal tunnel syndrome, cervical radiculopathy and diabetic peripheral neuropathy, we may do a nerve conduction/EMG. ?Adding home conditioning, bilateral hand x-rays, return in a month. ?

## 2021-06-18 NOTE — Progress Notes (Signed)
? ? ?  Procedures performed today:   ? ?Procedure: Real-time Ultrasound Guided injection of the right flexor pollicis longus tendon sheath ?Device: Samsung HS60  ?Verbal informed consent obtained.  ?Time-out conducted.  ?Noted no overlying erythema, induration, or other signs of local infection.  ?Skin prepped in a sterile fashion.  ?Local anesthesia: Topical Ethyl chloride.  ?With sterile technique and under real time ultrasound guidance: Noted flexor nodule, 1/2 cc lidocaine, 1 cc kenalog 40 injected easily. ?Completed without difficulty  ?Advised to call if fevers/chills, erythema, induration, drainage, or persistent bleeding.  ?Images permanently stored and available for review in PACS.  ?Impression: Technically successful ultrasound guided injection. ? ?Procedure: Real-time Ultrasound Guided injection of the left flexor pollicis longus tendon sheath ?Device: Samsung HS60  ?Verbal informed consent obtained.  ?Time-out conducted.  ?Noted no overlying erythema, induration, or other signs of local infection.  ?Skin prepped in a sterile fashion.  ?Local anesthesia: Topical Ethyl chloride.  ?With sterile technique and under real time ultrasound guidance: Noted flexor nodule, 1/2 cc lidocaine, 1 cc kenalog 40 injected easily. ?Completed without difficulty  ?Advised to call if fevers/chills, erythema, induration, drainage, or persistent bleeding.  ?Images permanently stored and available for review in PACS.  ?Impression: Technically successful ultrasound guided injection. ? ?Independent interpretation of notes and tests performed by another provider:  ? ?None. ? ?Brief History, Exam, Impression, and Recommendations:   ? ?Bilateral trigger thumb ?This is a very pleasant 41 year old female, she has had a history of pain and popping volar thumbs bilaterally. ?She also has some numbness and tingling in her long fingers, occasionally second and thumb. ?On exam she has palpable flexor tendon nodule at the flexor pollicis  longus consistent with a trigger thumb. ?In addition she has a negative Phalen's and Tinel signs. ?We will defer the investigation of the numbness and tingling until the follow-up visit. ?Differential includes carpal tunnel syndrome, cervical radiculopathy and diabetic peripheral neuropathy, we may do a nerve conduction/EMG. ?Adding home conditioning, bilateral hand x-rays, return in a month. ? ? ? ?___________________________________________ ?Gwen Her. Dianah Field, M.D., ABFM., CAQSM. ?Primary Care and Sports Medicine ?[place] ? ?Adjunct Instructor of Family Medicine  ?[employer] ?

## 2021-06-19 DIAGNOSIS — E1165 Type 2 diabetes mellitus with hyperglycemia: Secondary | ICD-10-CM | POA: Diagnosis not present

## 2021-06-19 DIAGNOSIS — Z0100 Encounter for examination of eyes and vision without abnormal findings: Secondary | ICD-10-CM | POA: Diagnosis not present

## 2021-06-19 DIAGNOSIS — E010 Iodine-deficiency related diffuse (endemic) goiter: Secondary | ICD-10-CM | POA: Diagnosis not present

## 2021-06-19 DIAGNOSIS — E039 Hypothyroidism, unspecified: Secondary | ICD-10-CM | POA: Diagnosis not present

## 2021-06-19 DIAGNOSIS — E781 Pure hyperglyceridemia: Secondary | ICD-10-CM | POA: Diagnosis not present

## 2021-06-21 ENCOUNTER — Ambulatory Visit: Payer: Medicare HMO | Admitting: Physical Therapy

## 2021-06-21 ENCOUNTER — Encounter: Payer: Self-pay | Admitting: Physical Therapy

## 2021-06-21 DIAGNOSIS — M25562 Pain in left knee: Secondary | ICD-10-CM

## 2021-06-21 DIAGNOSIS — R600 Localized edema: Secondary | ICD-10-CM | POA: Diagnosis not present

## 2021-06-21 DIAGNOSIS — M25662 Stiffness of left knee, not elsewhere classified: Secondary | ICD-10-CM | POA: Diagnosis not present

## 2021-06-21 DIAGNOSIS — R296 Repeated falls: Secondary | ICD-10-CM | POA: Diagnosis not present

## 2021-06-21 DIAGNOSIS — R262 Difficulty in walking, not elsewhere classified: Secondary | ICD-10-CM

## 2021-06-21 DIAGNOSIS — M545 Low back pain, unspecified: Secondary | ICD-10-CM | POA: Diagnosis not present

## 2021-06-21 NOTE — Therapy (Signed)
[place] [place], [place], [address] Phone: [phone]   Fax:  [phone]  Physical Therapy Treatment  Patient Details  Name: Wanda Oneill MRN: [id number] Date of Birth: 29-Jan-1981 Referring Provider (PT): Alvan Dame   Encounter Date: 06/21/2021   PT End of Session - 06/21/21 1540     Visit Number 7    Number of Visits 13    Date for PT Re-Evaluation 07/30/21    Authorization Type Humana    PT Start Time 8719    PT Stop Time 5974    PT Time Calculation (min) 45 min    Activity Tolerance Patient tolerated treatment well    Behavior During Therapy [facility] for tasks assessed/performed             Past Medical History:  Diagnosis Date   Diabetes mellitus    type II   Down syndrome    Family history of adverse reaction to anesthesia    mom has had n/v   Gastritis    Headache    Hepatic steatosis    Hidradenitis    Hypothyroidism    Irritable bowel syndrome    Periumbilical hernia    Pneumonia 03/2020   frequent    Restless legs    Tendonitis    chronic in left foot   Thyroid disease    hypothyroidism    Past Surgical History:  Procedure Laterality Date   AXILLARY HIDRADENITIS EXCISION Bilateral    CHOLECYSTECTOMY     HERNIA REPAIR     multiple   INCISIONAL HERNIA REPAIR  05/30/2015   LAPROSCOPIC   INCISIONAL HERNIA REPAIR N/A 05/30/2015   Procedure: LAPAROSCOPIC INCISIONAL HERNIA;  Surgeon: Rolm Bookbinder, MD;  Location: [place];  Service: General;  Laterality: N/A;   INSERTION OF MESH N/A 05/30/2015   Procedure: INSERTION OF MESH;  Surgeon: Rolm Bookbinder, MD;  Location: [place];  Service: General;  Laterality: N/A;   KNEE ARTHROSCOPY     right knee   LAPAROSCOPY N/A 05/30/2015   Procedure: LAPAROSCOPY DIAGNOSTIC;  Surgeon: Rolm Bookbinder, MD;  Location: [place];  Service: General;  Laterality: N/A;   PARTIAL KNEE ARTHROPLASTY Right 10/30/2017   Procedure: UNICOMPARTMENTAL RIGHT KNEE  LATERAL;  Surgeon: Paralee Cancel, MD;  [facility];  Service: Orthopedics;  Laterality: Right;  90 mins   PARTIAL KNEE ARTHROPLASTY Left 06/29/2020   Procedure: UNICOMPARTMENTAL KNEE LATERALLY;  Surgeon: Paralee Cancel, MD;  [facility];  Service: Orthopedics;  Laterality: Left;  70 MINS   TONSILLECTOMY     adenoids    There were no vitals filed for this visit.   Subjective Assessment - 06/21/21 1549     Subjective Patient reports that she is still scare with walking outside, no falls    Currently in Pain? Yes    Pain Score 4     Pain Location Back    Pain Descriptors / Indicators Sore;Spasm    Aggravating Factors  walking                               [facility] Adult PT Treatment/Exercise - 06/21/21 0001       Ambulation/Gait   Gait Comments ambulate outside curbs, slopes, also did the grass, she is very fearful of the slopes and the grass, needs CGA, did 4" and 6" setps with handrails up and over encouraging step over step  Knee/Hip Exercises: Machines for Strengthening   Cybex Knee Extension 5# 3x10    Cybex Knee Flexion 20# 3x10      Knee/Hip Exercises: Standing   Hip Flexion Stengthening;Both;15 reps;Knee bent;Knee straight    Hip Flexion Limitations 3# with RW    Hip Abduction Stengthening;Both;15 reps;Knee straight    Abduction Limitations 3# with RW    Hip Extension Stengthening;Both;15 reps;Knee straight    Extension Limitations 3# with RW    Forward Step Up Both;10 reps;Hand Hold: 1;Step Height: 4";Step Height: 6"                       PT Short Term Goals - 05/17/21 1649       PT SHORT TERM GOAL #1   Title independent with initial HEP    Status Achieved               PT Long Term Goals - 06/21/21 1608       PT LONG TERM GOAL #2   Title dcrease pain 50%    Status On-going      PT LONG TERM GOAL #3   Title increase left knee AROM to 0-110 degrees flexion    Status Achieved      PT LONG TERM GOAL #4    Title walk 500 feet without rest    Status Partially Met                   Plan - 06/21/21 1608     Clinical Impression Statement Still hesitant and fearful of curbs and uneven terrain as well as steps, she tends to want and need CGA, she can do it but the fear is the over riding factor.  She does report back and knee pain with walking as well    PT Next Visit Plan work on her confidence with walking on uneven terrain and on stairs    Consulted and Agree with Plan of Care Patient             Patient will benefit from skilled therapeutic intervention in order to improve the following deficits and impairments:  Abnormal gait, Decreased range of motion, Difficulty walking, Decreased endurance, Decreased activity tolerance, Pain, Decreased balance, Decreased strength, Decreased mobility  Visit Diagnosis: Difficulty in walking, not elsewhere classified  Repeated falls  Localized edema  Acute pain of left knee     Problem List Patient Active Problem List   Diagnosis Date Noted   Bilateral trigger thumb 06/18/2021   Chronic paronychia of finger 01/25/2021   S/P left unicompartmental knee replacement, lateral 06/29/2020   At risk for obstructive sleep apnea 06/11/2019   Cough 06/11/2019   Hyperglycemia 05/26/2019   Acute hypoxemic respiratory failure ([place]) 05/20/2019   Fibromyalgia 03/09/2019   Neck pain, chronic 03/09/2019   Acute shoulder bursitis, right 02/03/2019   Bile salt-induced diarrhea 09/25/2018   Arthritis of left acromioclavicular joint 09/23/2018   Precordial chest pain 08/11/2018   Morbid obesity ([place]) 10/31/2017   S/P right UKR, lateral 10/30/2017   Incarcerated epigastric hernia 05/30/2015   Left foot pain 08/09/2014   Chronic diarrhea 07/27/2014   Gout 08/10/2012   Acid reflux 01/22/2012   Down syndrome 11/11/2011   Hernia of abdominal wall 10/21/2011   Psoriasis 10/21/2011   Type 2 diabetes mellitus, uncontrolled 04/12/2011   Hypothyroid  04/12/2011   Gait abnormality 06/22/2010   Tibial tendinitis, posterior 06/22/2010    Sumner Boast, PT 06/21/2021, 4:10 PM  [place]  [address] [place], [place], [address] Phone: [phone]   Fax:  [phone]  Name: Wanda Oneill MRN: [id number] Date of Birth: 1980/03/13

## 2021-06-27 DIAGNOSIS — C4442 Squamous cell carcinoma of skin of scalp and neck: Secondary | ICD-10-CM | POA: Diagnosis not present

## 2021-06-27 DIAGNOSIS — L304 Erythema intertrigo: Secondary | ICD-10-CM | POA: Diagnosis not present

## 2021-06-27 DIAGNOSIS — L03011 Cellulitis of right finger: Secondary | ICD-10-CM | POA: Diagnosis not present

## 2021-06-27 DIAGNOSIS — L03012 Cellulitis of left finger: Secondary | ICD-10-CM | POA: Diagnosis not present

## 2021-07-03 ENCOUNTER — Encounter: Payer: Self-pay | Admitting: Physical Therapy

## 2021-07-03 ENCOUNTER — Ambulatory Visit: Payer: Medicare HMO | Admitting: Physical Therapy

## 2021-07-03 DIAGNOSIS — M545 Low back pain, unspecified: Secondary | ICD-10-CM | POA: Diagnosis not present

## 2021-07-03 DIAGNOSIS — M25562 Pain in left knee: Secondary | ICD-10-CM

## 2021-07-03 DIAGNOSIS — R600 Localized edema: Secondary | ICD-10-CM | POA: Diagnosis not present

## 2021-07-03 DIAGNOSIS — R296 Repeated falls: Secondary | ICD-10-CM | POA: Diagnosis not present

## 2021-07-03 DIAGNOSIS — M25662 Stiffness of left knee, not elsewhere classified: Secondary | ICD-10-CM

## 2021-07-03 DIAGNOSIS — R262 Difficulty in walking, not elsewhere classified: Secondary | ICD-10-CM | POA: Diagnosis not present

## 2021-07-03 NOTE — Therapy (Signed)
[place]. [place], [place], [address] Phone: [phone]   Fax:  [phone]  Physical Therapy Treatment  Patient Details  Name: Wanda Oneill MRN: [id number] Date of Birth: 1980-10-16 Referring Provider (PT): Alvan Dame   Encounter Date: 07/03/2021   PT End of Session - 07/03/21 3568     Visit Number 8    Number of Visits 13    Date for PT Re-Evaluation 07/30/21    Authorization Type Humana    Authorization Time Period 7/12    PT Start Time 1612    PT Stop Time 1700    PT Time Calculation (min) 48 min    Activity Tolerance Patient tolerated treatment well    Behavior During Therapy [facility] for tasks assessed/performed             Past Medical History:  Diagnosis Date   Diabetes mellitus    type II   Down syndrome    Family history of adverse reaction to anesthesia    mom has had n/v   Gastritis    Headache    Hepatic steatosis    Hidradenitis    Hypothyroidism    Irritable bowel syndrome    Periumbilical hernia    Pneumonia 03/2020   frequent    Restless legs    Tendonitis    chronic in left foot   Thyroid disease    hypothyroidism    Past Surgical History:  Procedure Laterality Date   AXILLARY HIDRADENITIS EXCISION Bilateral    CHOLECYSTECTOMY     HERNIA REPAIR     multiple   INCISIONAL HERNIA REPAIR  05/30/2015   LAPROSCOPIC   INCISIONAL HERNIA REPAIR N/A 05/30/2015   Procedure: LAPAROSCOPIC INCISIONAL HERNIA;  Surgeon: Rolm Bookbinder, MD;  Location: [facility];  Service: General;  Laterality: N/A;   INSERTION OF MESH N/A 05/30/2015   Procedure: INSERTION OF MESH;  Surgeon: Rolm Bookbinder, MD;  Location: [facility];  Service: General;  Laterality: N/A;   KNEE ARTHROSCOPY     right knee   LAPAROSCOPY N/A 05/30/2015   Procedure: LAPAROSCOPY DIAGNOSTIC;  Surgeon: Rolm Bookbinder, MD;  Location: [place] [facility];  Service: General;  Laterality: N/A;   PARTIAL KNEE ARTHROPLASTY Right 10/30/2017    Procedure: UNICOMPARTMENTAL RIGHT KNEE LATERAL;  Surgeon: Paralee Cancel, MD;  [facility];  Service: Orthopedics;  Laterality: Right;  90 mins   PARTIAL KNEE ARTHROPLASTY Left 06/29/2020   Procedure: UNICOMPARTMENTAL KNEE LATERALLY;  Surgeon: Paralee Cancel, MD;  [facility];  Service: Orthopedics;  Laterality: Left;  70 MINS   TONSILLECTOMY     adenoids    There were no vitals filed for this visit.   Subjective Assessment - 07/03/21 1614     Subjective Reports some left knee pain this afternoon, no falls    Currently in Pain? Yes    Pain Score 4     Pain Location Knee    Pain Orientation Left    Pain Descriptors / Indicators Aching;Sore    Aggravating Factors  walking                               [facility] Adult PT Treatment/Exercise - 07/03/21 0001       Ambulation/Gait   Gait Comments outside up and down a curb and then around the parking island, no rest, stairs up and down, can go step over step up ont he 4" and 6",  going down she does one at a time      High Level Balance   High Level Balance Comments side stepping over small stick, then trying to step over forward.  Cues needed for big steps.  Cone toe touches with SPC      Lumbar Exercises: Supine   Bridge 20 reps    Other Supine Lumbar Exercises isometric abs      Knee/Hip Exercises: Aerobic   Nustep level 5 x 6 minutes      Knee/Hip Exercises: Machines for Strengthening   Cybex Knee Extension 5# 3x10    Cybex Knee Flexion 20# 3x10                       PT Short Term Goals - 05/17/21 1649       PT SHORT TERM GOAL #1   Title independent with initial HEP    Status Achieved               PT Long Term Goals - 07/03/21 1615       PT LONG TERM GOAL #1   Title increase BERG to 45/56    Status On-going      PT LONG TERM GOAL #2   Title dcrease pain 50%    Status On-going      PT LONG TERM GOAL #3   Title increase left knee AROM to 0-110 degrees flexion     Status Achieved      PT LONG TERM GOAL #4   Title walk 500 feet without rest    Status Partially Met      PT LONG TERM GOAL #5   Title decrease TUG time to 16 seconds    Status Achieved                   Plan - 07/03/21 1649     Clinical Impression Statement Able to walk further without rest, still very hesitant going down slope and with any uneven terrain, Did well on the even surfaces.  I started doing some balance activities, difficulty going down stairs , but does well going up.  Has very weak core             Patient will benefit from skilled therapeutic intervention in order to improve the following deficits and impairments:  Abnormal gait, Decreased range of motion, Difficulty walking, Decreased endurance, Decreased activity tolerance, Pain, Decreased balance, Decreased strength, Decreased mobility  Visit Diagnosis: Difficulty in walking, not elsewhere classified  Repeated falls  Localized edema  Acute pain of left knee  Acute bilateral low back pain without sciatica  Knee stiff, left     Problem List Patient Active Problem List   Diagnosis Date Noted   Bilateral trigger thumb 06/18/2021   Chronic paronychia of finger 01/25/2021   S/P left unicompartmental knee replacement, lateral 06/29/2020   At risk for obstructive sleep apnea 06/11/2019   Cough 06/11/2019   Hyperglycemia 05/26/2019   Acute hypoxemic respiratory failure ([place]) 05/20/2019   Fibromyalgia 03/09/2019   Neck pain, chronic 03/09/2019   Acute shoulder bursitis, right 02/03/2019   Bile salt-induced diarrhea 09/25/2018   Arthritis of left acromioclavicular joint 09/23/2018   Precordial chest pain 08/11/2018   Morbid obesity ([place]) 10/31/2017   S/P right UKR, lateral 10/30/2017   Incarcerated epigastric hernia 05/30/2015   Left foot pain 08/09/2014   Chronic diarrhea 07/27/2014   Gout 08/10/2012   Acid reflux 01/22/2012   Down syndrome 11/11/2011  Hernia of abdominal wall  10/21/2011   Psoriasis 10/21/2011   Type 2 diabetes mellitus, uncontrolled 04/12/2011   Hypothyroid 04/12/2011   Gait abnormality 06/22/2010   Tibial tendinitis, posterior 06/22/2010    Sumner Boast, PT 07/03/2021, 4:51 PM  [place]. [place], [place], [address] Phone: [phone]   Fax:  [phone]  Name: Wanda Oneill MRN: [id number] Date of Birth: 1980/12/13

## 2021-07-09 ENCOUNTER — Encounter: Payer: Self-pay | Admitting: Physical Therapy

## 2021-07-09 ENCOUNTER — Ambulatory Visit: Payer: Medicare HMO | Attending: Family Medicine | Admitting: Physical Therapy

## 2021-07-09 DIAGNOSIS — R296 Repeated falls: Secondary | ICD-10-CM | POA: Insufficient documentation

## 2021-07-09 DIAGNOSIS — M545 Low back pain, unspecified: Secondary | ICD-10-CM | POA: Diagnosis not present

## 2021-07-09 DIAGNOSIS — M25571 Pain in right ankle and joints of right foot: Secondary | ICD-10-CM | POA: Diagnosis not present

## 2021-07-09 DIAGNOSIS — R262 Difficulty in walking, not elsewhere classified: Secondary | ICD-10-CM | POA: Insufficient documentation

## 2021-07-09 DIAGNOSIS — R600 Localized edema: Secondary | ICD-10-CM | POA: Insufficient documentation

## 2021-07-09 DIAGNOSIS — M25662 Stiffness of left knee, not elsewhere classified: Secondary | ICD-10-CM | POA: Insufficient documentation

## 2021-07-09 DIAGNOSIS — M25562 Pain in left knee: Secondary | ICD-10-CM | POA: Diagnosis not present

## 2021-07-09 NOTE — Therapy (Signed)
[place]. [place], [place], [address] Phone: [phone]   Fax:  [phone]  Physical Therapy Treatment  Patient Details  Name: Wanda Oneill MRN: [id number] Date of Birth: [birth year] Referring Provider (PT): Alvan Dame   Encounter Date: 07/09/2021   PT End of Session - 07/09/21 5597     Visit Number 9    Number of Visits 13    Date for PT Re-Evaluation 07/30/21    Authorization Type [employer]    PT Start Time 4163    PT Stop Time 8453    PT Time Calculation (min) 45 min    Activity Tolerance Patient tolerated treatment well    Behavior During Therapy [facility] for tasks assessed/performed             Past Medical History:  Diagnosis Date   Diabetes mellitus    type II   Down syndrome    Family history of adverse reaction to anesthesia    mom has had n/v   Gastritis    Headache    Hepatic steatosis    Hidradenitis    Hypothyroidism    Irritable bowel syndrome    Periumbilical hernia    Pneumonia 03/2020   frequent    Restless legs    Tendonitis    chronic in left foot   Thyroid disease    hypothyroidism    Past Surgical History:  Procedure Laterality Date   AXILLARY HIDRADENITIS EXCISION Bilateral    CHOLECYSTECTOMY     HERNIA REPAIR     multiple   INCISIONAL HERNIA REPAIR  05/30/2015   LAPROSCOPIC   INCISIONAL HERNIA REPAIR N/A 05/30/2015   Procedure: LAPAROSCOPIC INCISIONAL HERNIA;  Surgeon: Rolm Bookbinder, MD;  Location: [facility];  Service: General;  Laterality: N/A;   INSERTION OF MESH N/A 05/30/2015   Procedure: INSERTION OF MESH;  Surgeon: Rolm Bookbinder, MD;  Location: [facility];  Service: General;  Laterality: N/A;   KNEE ARTHROSCOPY     right knee   LAPAROSCOPY N/A 05/30/2015   Procedure: LAPAROSCOPY DIAGNOSTIC;  Surgeon: Rolm Bookbinder, MD;  Location: [facility];  Service: General;  Laterality: N/A;   PARTIAL KNEE ARTHROPLASTY Right 10/30/2017   Procedure: UNICOMPARTMENTAL RIGHT KNEE  LATERAL;  Surgeon: Paralee Cancel, MD;  Location: [facility];  Service: Orthopedics;  Laterality: Right;  90 mins   PARTIAL KNEE ARTHROPLASTY Left 06/29/2020   Procedure: UNICOMPARTMENTAL KNEE LATERALLY;  Surgeon: Paralee Cancel, MD;  Location: [facility];  Service: Orthopedics;  Laterality: Left;  70 MINS   TONSILLECTOMY     adenoids    There were no vitals filed for this visit.   Subjective Assessment - 07/09/21 1531     Subjective No falls, reports that she thinks she is feeling a little more comfortable with walking at times    Currently in Pain? Yes    Pain Score 3     Pain Location Knee    Pain Orientation Left    Pain Descriptors / Indicators Aching;Sore    Aggravating Factors  walking                               [facility] Adult PT Treatment/Exercise - 07/09/21 0001       Ambulation/Gait   Gait Comments outside around the parking island, up and down a curb with CGA, she did this faster than her normal and did not hesitate as much, did stairs step  over step going up and then one at a time going down 4" and 6"      High Level Balance   High Level Balance Comments side stepping over small stick, then trying to step over forward.  Cues needed for big steps.  Cone toe touches with SPC      Lumbar Exercises: Supine   Bridge 20 reps    Other Supine Lumbar Exercises clams green tband    Other Supine Lumbar Exercises feet on ball K2C, trunk rotaiton, small bridge and isometric abs      Knee/Hip Exercises: Aerobic   Nustep level 5 x 6 minutes      Knee/Hip Exercises: Supine   Short Arc Quad Sets Both;3 sets;10 reps    Short Arc Quad Sets Limitations 3#                       PT Short Term Goals - 05/17/21 1649       PT SHORT TERM GOAL #1   Title independent with initial HEP    Status Achieved               PT Long Term Goals - 07/09/21 1533       PT LONG TERM GOAL #2   Title dcrease pain 50%    Status Partially Met      PT LONG TERM  GOAL #3   Title increase left knee AROM to 0-110 degrees flexion    Status Achieved      PT LONG TERM GOAL #4   Title walk 500 feet without rest    Status Partially Met                   Plan - 07/09/21 1758     Clinical Impression Statement Patient did the best she has done with me walking outside, around the parking island, at a good pace and without rest, still needs CGA for the negotiating curbs, she struggles most going down.  She did say she is going to a pool on Friday, she talked with me about her fear of this with walking on wet surfaces, we talked aobut wearing water shoes, no flip flops or slides, being slow and taking her walker if needed.    PT Next Visit Plan work on her confidence with walking on uneven terrain and on stairs    Consulted and Agree with Plan of Care Patient             Patient will benefit from skilled therapeutic intervention in order to improve the following deficits and impairments:  Abnormal gait, Decreased range of motion, Difficulty walking, Decreased endurance, Decreased activity tolerance, Pain, Decreased balance, Decreased strength, Decreased mobility  Visit Diagnosis: Difficulty in walking, not elsewhere classified  Repeated falls  Localized edema  Acute pain of left knee  Acute bilateral low back pain without sciatica     Problem List Patient Active Problem List   Diagnosis Date Noted   Bilateral trigger thumb 06/18/2021   Chronic paronychia of finger 01/25/2021   S/P left unicompartmental knee replacement, lateral 06/29/2020   At risk for obstructive sleep apnea 06/11/2019   Cough 06/11/2019   Hyperglycemia 05/26/2019   Acute hypoxemic respiratory failure ([place]) 05/20/2019   Fibromyalgia 03/09/2019   Neck pain, chronic 03/09/2019   Acute shoulder bursitis, right 02/03/2019   Bile salt-induced diarrhea 09/25/2018   Arthritis of left acromioclavicular joint 09/23/2018   Precordial chest pain 08/11/2018   Morbid  obesity ([place]) 10/31/2017   S/P right UKR, lateral 10/30/2017   Incarcerated epigastric hernia 05/30/2015   Left foot pain 08/09/2014   Chronic diarrhea 07/27/2014   Gout 08/10/2012   Acid reflux 01/22/2012   Down syndrome 11/11/2011   Hernia of abdominal wall 10/21/2011   Psoriasis 10/21/2011   Type 2 diabetes mellitus, uncontrolled 04/12/2011   Hypothyroid 04/12/2011   Gait abnormality 06/22/2010   Tibial tendinitis, posterior 06/22/2010    Sumner Boast, PT 07/09/2021, 6:01 PM  [place]. [place], [place], [address] Phone: [phone]   Fax:  [phone]  Name: Wanda Oneill MRN: [id number] Date of Birth: 11/14/1980

## 2021-07-10 ENCOUNTER — Other Ambulatory Visit: Payer: Self-pay

## 2021-07-10 MED ORDER — EMPAGLIFLOZIN 25 MG PO TABS: 25.0000 mg | ORAL_TABLET | Freq: Every day | ORAL | 3 refills | Status: DC

## 2021-07-10 NOTE — Telephone Encounter (Signed)
Received a fax for refill on Jardiance 25 mg, but I do not see a providers name next to it. Do you fill this?

## 2021-07-12 ENCOUNTER — Ambulatory Visit: Payer: Medicare HMO | Admitting: Physical Therapy

## 2021-07-12 DIAGNOSIS — Z96653 Presence of artificial knee joint, bilateral: Secondary | ICD-10-CM | POA: Diagnosis not present

## 2021-07-12 DIAGNOSIS — Z471 Aftercare following joint replacement surgery: Secondary | ICD-10-CM | POA: Diagnosis not present

## 2021-07-16 ENCOUNTER — Encounter: Payer: Self-pay | Admitting: Physical Therapy

## 2021-07-16 ENCOUNTER — Ambulatory Visit: Payer: Medicare HMO | Admitting: Physical Therapy

## 2021-07-16 DIAGNOSIS — M545 Low back pain, unspecified: Secondary | ICD-10-CM | POA: Diagnosis not present

## 2021-07-16 DIAGNOSIS — M25662 Stiffness of left knee, not elsewhere classified: Secondary | ICD-10-CM | POA: Diagnosis not present

## 2021-07-16 DIAGNOSIS — M25571 Pain in right ankle and joints of right foot: Secondary | ICD-10-CM | POA: Diagnosis not present

## 2021-07-16 DIAGNOSIS — R262 Difficulty in walking, not elsewhere classified: Secondary | ICD-10-CM

## 2021-07-16 DIAGNOSIS — M25562 Pain in left knee: Secondary | ICD-10-CM | POA: Diagnosis not present

## 2021-07-16 DIAGNOSIS — R600 Localized edema: Secondary | ICD-10-CM | POA: Diagnosis not present

## 2021-07-16 DIAGNOSIS — R296 Repeated falls: Secondary | ICD-10-CM | POA: Diagnosis not present

## 2021-07-16 NOTE — Therapy (Signed)
[place]. [place], [place], [address] Phone: [phone]   Fax:  [phone] Progress Note Reporting Period 04/25/21 to 07/16/21  See note below for Objective Data and Assessment of Progress/Goals.     Physical Therapy Treatment  Patient Details  Name: Wanda Oneill MRN: [id number] Date of Birth: 12-13-1980 Referring Provider (PT): Alvan Dame   Encounter Date: 07/16/2021   PT End of Session - 07/16/21 1652     Visit Number 10    Number of Visits 13    Date for PT Re-Evaluation 07/30/21    Authorization Type [employer]    PT Start Time 8756    PT Stop Time 4332    PT Time Calculation (min) 55 min    Activity Tolerance Patient tolerated treatment well    Behavior During Therapy [facility] for tasks assessed/performed             Past Medical History:  Diagnosis Date   Diabetes mellitus    type II   Down syndrome    Family history of adverse reaction to anesthesia    mom has had n/v   Gastritis    Headache    Hepatic steatosis    Hidradenitis    Hypothyroidism    Irritable bowel syndrome    Periumbilical hernia    Pneumonia 03/2020   frequent    Restless legs    Tendonitis    chronic in left foot   Thyroid disease    hypothyroidism    Past Surgical History:  Procedure Laterality Date   AXILLARY HIDRADENITIS EXCISION Bilateral    CHOLECYSTECTOMY     HERNIA REPAIR     multiple   INCISIONAL HERNIA REPAIR  05/30/2015   LAPROSCOPIC   INCISIONAL HERNIA REPAIR N/A 05/30/2015   Procedure: LAPAROSCOPIC INCISIONAL HERNIA;  Surgeon: Rolm Bookbinder, MD;  Location: [facility];  Service: General;  Laterality: N/A;   INSERTION OF MESH N/A 05/30/2015   Procedure: INSERTION OF MESH;  Surgeon: Rolm Bookbinder, MD;  Location: [facility];  Service: General;  Laterality: N/A;   KNEE ARTHROSCOPY     right knee   LAPAROSCOPY N/A 05/30/2015   Procedure: LAPAROSCOPY DIAGNOSTIC;  Surgeon: Rolm Bookbinder, MD;  Location: [facility];   Service: General;  Laterality: N/A;   PARTIAL KNEE ARTHROPLASTY Right 10/30/2017   Procedure: UNICOMPARTMENTAL RIGHT KNEE LATERAL;  Surgeon: Paralee Cancel, MD;  [facility];  Service: Orthopedics;  Laterality: Right;  90 mins   PARTIAL KNEE ARTHROPLASTY Left 06/29/2020   Procedure: UNICOMPARTMENTAL KNEE LATERALLY;  Surgeon: Paralee Cancel, MD;  [facility];  Service: Orthopedics;  Laterality: Left;  70 MINS   TONSILLECTOMY     adenoids    There were no vitals filed for this visit.   Subjective Assessment - 07/16/21 1653     Subjective Went outside and school, reports that she does okay but has some fear of falling, reports sore in teh back and knee                               [facility] Adult PT Treatment/Exercise - 07/16/21 0001       Ambulation/Gait   Gait Comments outside around the parking island, up and down a curb with CGA, she did this faster than her normal and did not hesitate as much, did stairs step over step going up and then one at a time going down 4" and 6"  High Level Balance   High Level Balance Comments side stepping over small stick, then trying to step over forward.  Cues needed for big steps.  Cone toe touches with SPC      Lumbar Exercises: Machines for Strengthening   Other Lumbar Machine Exercise 5# pulls with core activation and postural cues      Lumbar Exercises: Supine   Bridge 20 reps    Other Supine Lumbar Exercises feet on ball K2C, trunk rotaiton, small bridge and isometric abs      Knee/Hip Exercises: Aerobic   Nustep level 5 x 6 minutes      Knee/Hip Exercises: Machines for Strengthening   Cybex Knee Extension 5# 2x15    Cybex Knee Flexion 20# 2x15    Cybex Leg Press 20# 2x10                       PT Short Term Goals - 05/17/21 1649       PT SHORT TERM GOAL #1   Title independent with initial HEP    Status Achieved               PT Long Term Goals - 07/16/21 1728       PT LONG TERM  GOAL #1   Title increase BERG to 45/56    Status Partially Met      PT LONG TERM GOAL #2   Title dcrease pain 50%    Status Partially Met      PT LONG TERM GOAL #3   Title increase left knee AROM to 0-110 degrees flexion    Status Achieved      PT LONG TERM GOAL #4   Title walk 500 feet without rest    Status Partially Met      PT LONG TERM GOAL #5   Title decrease TUG time to 16 seconds    Status Achieved                   Plan - 07/16/21 1729     Clinical Impression Statement Pateint continues to improve able to walk farther, and can do the curb with SBA to supervision, she still is hesitant and wants to reach out and hold onto PT but she can do it.  I am trying to add in some exercises added leg press and straight arm pulls today to help increase her ability, will need to work on HEP and for her to do it at home.    PT Next Visit Plan conitnue to pus her ability    Consulted and Agree with Plan of Care Patient             Patient will benefit from skilled therapeutic intervention in order to improve the following deficits and impairments:  Abnormal gait, Decreased range of motion, Difficulty walking, Decreased endurance, Decreased activity tolerance, Pain, Decreased balance, Decreased strength, Decreased mobility  Visit Diagnosis: Difficulty in walking, not elsewhere classified  Repeated falls  Localized edema  Acute pain of left knee  Acute bilateral low back pain without sciatica     Problem List Patient Active Problem List   Diagnosis Date Noted   Bilateral trigger thumb 06/18/2021   Chronic paronychia of finger 01/25/2021   S/P left unicompartmental knee replacement, lateral 06/29/2020   At risk for obstructive sleep apnea 06/11/2019   Cough 06/11/2019   Hyperglycemia 05/26/2019   Acute hypoxemic respiratory failure ([place]) 05/20/2019   Fibromyalgia 03/09/2019   Neck  pain, chronic 03/09/2019   Acute shoulder bursitis, right 02/03/2019    Bile salt-induced diarrhea 09/25/2018   Arthritis of left acromioclavicular joint 09/23/2018   Precordial chest pain 08/11/2018   Morbid obesity ([place]) 10/31/2017   S/P right UKR, lateral 10/30/2017   Incarcerated epigastric hernia 05/30/2015   Left foot pain 08/09/2014   Chronic diarrhea 07/27/2014   Gout 08/10/2012   Acid reflux 01/22/2012   Down syndrome 11/11/2011   Hernia of abdominal wall 10/21/2011   Psoriasis 10/21/2011   Type 2 diabetes mellitus, uncontrolled 04/12/2011   Hypothyroid 04/12/2011   Gait abnormality 06/22/2010   Tibial tendinitis, posterior 06/22/2010    Sumner Boast, PT 07/16/2021, 5:32 PM  [place]. [place], [place], [address] Phone: [phone]   Fax:  [phone]  Name: Wanda Oneill MRN: [id number] Date of Birth: [birth year]

## 2021-07-24 ENCOUNTER — Encounter: Payer: Self-pay | Admitting: Physical Therapy

## 2021-07-24 ENCOUNTER — Ambulatory Visit: Payer: Medicare HMO | Admitting: Physical Therapy

## 2021-07-24 ENCOUNTER — Ambulatory Visit: Payer: Medicare HMO | Admitting: Sports Medicine

## 2021-07-24 DIAGNOSIS — R600 Localized edema: Secondary | ICD-10-CM

## 2021-07-24 DIAGNOSIS — R296 Repeated falls: Secondary | ICD-10-CM | POA: Diagnosis not present

## 2021-07-24 DIAGNOSIS — M25662 Stiffness of left knee, not elsewhere classified: Secondary | ICD-10-CM | POA: Diagnosis not present

## 2021-07-24 DIAGNOSIS — M25562 Pain in left knee: Secondary | ICD-10-CM

## 2021-07-24 DIAGNOSIS — R262 Difficulty in walking, not elsewhere classified: Secondary | ICD-10-CM

## 2021-07-24 DIAGNOSIS — M545 Low back pain, unspecified: Secondary | ICD-10-CM

## 2021-07-24 DIAGNOSIS — M25571 Pain in right ankle and joints of right foot: Secondary | ICD-10-CM | POA: Diagnosis not present

## 2021-07-24 NOTE — Therapy (Signed)
[place]. [place], [place], [address] Phone: [phone]   Fax:  [phone]  Physical Therapy Treatment  Patient Details  Name: Wanda Oneill MRN: [id number] Date of Birth: 04-12-1980 Referring Provider (PT): Alvan Dame   Encounter Date: 07/24/2021   PT End of Session - 07/24/21 1524     Visit Number 11    Number of Visits 13    Date for PT Re-Evaluation 08/03/21    Authorization Time Period 10/12    PT Start Time 5597    PT Stop Time 1608    PT Time Calculation (min) 52 min    Activity Tolerance Patient tolerated treatment well    Behavior During Therapy [facility] for tasks assessed/performed             Past Medical History:  Diagnosis Date   Diabetes mellitus    type II   Down syndrome    Family history of adverse reaction to anesthesia    mom has had n/v   Gastritis    Headache    Hepatic steatosis    Hidradenitis    Hypothyroidism    Irritable bowel syndrome    Periumbilical hernia    Pneumonia 03/2020   frequent    Restless legs    Tendonitis    chronic in left foot   Thyroid disease    hypothyroidism    Past Surgical History:  Procedure Laterality Date   AXILLARY HIDRADENITIS EXCISION Bilateral    CHOLECYSTECTOMY     HERNIA REPAIR     multiple   INCISIONAL HERNIA REPAIR  05/30/2015   LAPROSCOPIC   INCISIONAL HERNIA REPAIR N/A 05/30/2015   Procedure: LAPAROSCOPIC INCISIONAL HERNIA;  Surgeon: Rolm Bookbinder, MD;  Location: [facility];  Service: General;  Laterality: N/A;   INSERTION OF MESH N/A 05/30/2015   Procedure: INSERTION OF MESH;  Surgeon: Rolm Bookbinder, MD;  Location: [facility];  Service: General;  Laterality: N/A;   KNEE ARTHROSCOPY     right knee   LAPAROSCOPY N/A 05/30/2015   Procedure: LAPAROSCOPY DIAGNOSTIC;  Surgeon: Rolm Bookbinder, MD;  Location: [facility];  Service: General;  Laterality: N/A;   PARTIAL KNEE ARTHROPLASTY Right 10/30/2017   Procedure: UNICOMPARTMENTAL RIGHT  KNEE LATERAL;  Surgeon: Paralee Cancel, MD;  [facility];  Service: Orthopedics;  Laterality: Right;  90 mins   PARTIAL KNEE ARTHROPLASTY Left 06/29/2020   Procedure: UNICOMPARTMENTAL KNEE LATERALLY;  Surgeon: Paralee Cancel, MD;  [facility];  Service: Orthopedics;  Laterality: Left;  70 MINS   TONSILLECTOMY     adenoids    There were no vitals filed for this visit.   Subjective Assessment - 07/24/21 1525     Subjective Patient reports that she was lifting her walker into the van and had a pop, and has had some pain    Currently in Pain? Yes    Pain Score 7     Pain Location Buttocks    Pain Orientation Right    Pain Descriptors / Indicators Sore    Aggravating Factors  picking up walker    Pain Relieving Factors pain medication                [facility] PT Assessment - 07/24/21 0001       Berg Balance Test   Sit to Stand Able to stand without using hands and stabilize independently    Standing Unsupported Able to stand safely 2 minutes    Sitting with Back Unsupported  but Feet Supported on Floor or Stool Able to sit safely and securely 2 minutes    Stand to Sit Sits safely with minimal use of hands    Transfers Able to transfer safely, definite need of hands    Standing Unsupported with Eyes Closed Able to stand 10 seconds safely    Standing Unsupported with Feet Together Able to place feet together independently and stand 1 minute safely    From Standing, Reach Forward with Outstretched Arm Can reach forward >12 cm safely (5")    From Standing Position, Pick up Object from Floor Able to pick up shoe safely and easily    From Standing Position, Turn to Look Behind Over each Shoulder Turn sideways only but maintains balance    Turn 360 Degrees Able to turn 360 degrees safely one side only in 4 seconds or less    Standing Unsupported, Alternately Place Feet on Step/Stool Able to complete 4 steps without aid or supervision    Standing Unsupported, One Foot in Front Needs  help to step but can hold 15 seconds    Standing on One Leg Tries to lift leg/unable to hold 3 seconds but remains standing independently    Total Score 43      Timed Up and Go Test   Normal TUG (seconds) 18    TUG Comments no device                           [facility] Adult PT Treatment/Exercise - 07/24/21 0001       Ambulation/Gait   Gait Comments outside down the slope, down a curb and back up, it was a little wet and she was more scared today, needed SGA, stairs 4" and 6" with handrails      Knee/Hip Exercises: Aerobic   Nustep level 5 x 6 minutes      Knee/Hip Exercises: Machines for Strengthening   Cybex Knee Extension 5# 2x15    Cybex Knee Flexion 20# 2x15                     PT Education - 07/24/21 1556     Education Details HEP 4 way kicks , marches, side stepping and SLS for balance, seated kicks, marches and toe taps for mm tone    Person(s) Educated Patient    Methods Explanation;Demonstration;Verbal cues;Handout    Comprehension Verbalized understanding;Returned demonstration;Verbal cues required              PT Short Term Goals - 05/17/21 1649       PT SHORT TERM GOAL #1   Title independent with initial HEP    Status Achieved               PT Long Term Goals - 07/16/21 1728       PT LONG TERM GOAL #1   Title increase BERG to 45/56    Status Partially Met      PT LONG TERM GOAL #2   Title dcrease pain 50%    Status Partially Met      PT LONG TERM GOAL #3   Title increase left knee AROM to 0-110 degrees flexion    Status Achieved      PT LONG TERM GOAL #4   Title walk 500 feet without rest    Status Partially Met      PT LONG TERM GOAL #5   Title decrease TUG time to 16 seconds  Status Achieved                   Plan - 07/24/21 1557     Clinical Impression Statement Had good improvement with TUG and BERG, still at an increased rick for falls but overall better, she tends to hold her self back  due to fear.  She is very hesitant with walking, curbs and steps, she tends to want to have CGA/HHA.    PT Next Visit Plan will look to d/c next week    Consulted and Agree with Plan of Care Patient             Patient will benefit from skilled therapeutic intervention in order to improve the following deficits and impairments:  Abnormal gait, Decreased range of motion, Difficulty walking, Decreased endurance, Decreased activity tolerance, Pain, Decreased balance, Decreased strength, Decreased mobility  Visit Diagnosis: Difficulty in walking, not elsewhere classified  Repeated falls  Localized edema  Acute pain of left knee  Acute bilateral low back pain without sciatica  Knee stiff, left     Problem List Patient Active Problem List   Diagnosis Date Noted   Bilateral trigger thumb 06/18/2021   Chronic paronychia of finger 01/25/2021   S/P left unicompartmental knee replacement, lateral 06/29/2020   At risk for obstructive sleep apnea 06/11/2019   Cough 06/11/2019   Hyperglycemia 05/26/2019   Acute hypoxemic respiratory failure ([place]) 05/20/2019   Fibromyalgia 03/09/2019   Neck pain, chronic 03/09/2019   Acute shoulder bursitis, right 02/03/2019   Bile salt-induced diarrhea 09/25/2018   Arthritis of left acromioclavicular joint 09/23/2018   Precordial chest pain 08/11/2018   Morbid obesity ([place]) 10/31/2017   S/P right UKR, lateral 10/30/2017   Incarcerated epigastric hernia 05/30/2015   Left foot pain 08/09/2014   Chronic diarrhea 07/27/2014   Gout 08/10/2012   Acid reflux 01/22/2012   Down syndrome 11/11/2011   Hernia of abdominal wall 10/21/2011   Psoriasis 10/21/2011   Type 2 diabetes mellitus, uncontrolled 04/12/2011   Hypothyroid 04/12/2011   Gait abnormality 06/22/2010   Tibial tendinitis, posterior 06/22/2010    Sumner Boast, PT 07/24/2021, 3:59 PM  [place]. [place],  [place], [address] Phone: [phone]   Fax:  [phone]  Name: Wanda Oneill MRN: [id number] Date of Birth: May 14, 1980

## 2021-07-31 ENCOUNTER — Ambulatory Visit: Payer: Medicare HMO | Admitting: Physical Therapy

## 2021-07-31 ENCOUNTER — Encounter: Payer: Self-pay | Admitting: Physical Therapy

## 2021-07-31 DIAGNOSIS — M545 Low back pain, unspecified: Secondary | ICD-10-CM | POA: Diagnosis not present

## 2021-07-31 DIAGNOSIS — M25662 Stiffness of left knee, not elsewhere classified: Secondary | ICD-10-CM

## 2021-07-31 DIAGNOSIS — R296 Repeated falls: Secondary | ICD-10-CM | POA: Diagnosis not present

## 2021-07-31 DIAGNOSIS — M25562 Pain in left knee: Secondary | ICD-10-CM

## 2021-07-31 DIAGNOSIS — R262 Difficulty in walking, not elsewhere classified: Secondary | ICD-10-CM | POA: Diagnosis not present

## 2021-07-31 DIAGNOSIS — R600 Localized edema: Secondary | ICD-10-CM | POA: Diagnosis not present

## 2021-07-31 DIAGNOSIS — M25571 Pain in right ankle and joints of right foot: Secondary | ICD-10-CM | POA: Diagnosis not present

## 2021-08-03 ENCOUNTER — Encounter (HOSPITAL_BASED_OUTPATIENT_CLINIC_OR_DEPARTMENT_OTHER): Payer: Self-pay | Admitting: Obstetrics & Gynecology

## 2021-08-03 ENCOUNTER — Ambulatory Visit (INDEPENDENT_AMBULATORY_CARE_PROVIDER_SITE_OTHER): Payer: Medicare HMO | Admitting: Obstetrics & Gynecology

## 2021-08-03 VITALS — BP 119/82 | HR 86 | Ht 64.0 in | Wt 231.2 lb

## 2021-08-03 DIAGNOSIS — Z6839 Body mass index (BMI) 39.0-39.9, adult: Secondary | ICD-10-CM

## 2021-08-03 DIAGNOSIS — Q909 Down syndrome, unspecified: Secondary | ICD-10-CM

## 2021-08-03 DIAGNOSIS — N926 Irregular menstruation, unspecified: Secondary | ICD-10-CM | POA: Diagnosis not present

## 2021-08-03 DIAGNOSIS — K439 Ventral hernia without obstruction or gangrene: Secondary | ICD-10-CM

## 2021-08-03 DIAGNOSIS — E6609 Other obesity due to excess calories: Secondary | ICD-10-CM

## 2021-08-03 MED ORDER — NORETHINDRONE ACETATE 5 MG PO TABS: 5.0000 mg | ORAL_TABLET | Freq: Two times a day (BID) | ORAL | 1 refills | Status: DC

## 2021-08-06 ENCOUNTER — Encounter (HOSPITAL_BASED_OUTPATIENT_CLINIC_OR_DEPARTMENT_OTHER): Payer: Self-pay | Admitting: Obstetrics & Gynecology

## 2021-08-06 NOTE — Progress Notes (Signed)
41 y.o. G0 Single White or Caucasian female here for new patient exam.  She is accompanied by her mother and father (retired Dr. Linna Darner).  Pt has hx of Down's Syndrome so history is supplemented by her mother and father.  Pt is on continuous OCPs and having some intermittent break through bleeding.  Pap smear in office attempted by Dr. Lorelei Pont.  Pt's mother reports this was difficult.  Pap was negative but transition zone was absent.  HR HPV was negative as well.  Pt has never been SA.  Different OCPs, progesterone methods, IUD, possible endometrial ablation all discussed.  Feel should start with transabdominal ultrasound.  If cannot obtain adequate images, may need to consider exam under anesthesia with repeat pap to be sure transition zone obtained, possible transvaginal ultrasound, possible endometrial evaluation.  Ideally, pt and family would like her to not have much in the way of menstrual cycling.    No LMP recorded. Patient is perimenopausal.          Sexually active: No.  The current method of family planning is abstinence.    Smoker:  no  Health Maintenance: Pap:  05/30/2021 History of abnormal Pap:  no MMG:  screening exam ordered   reports that she has never smoked. She has never used smokeless tobacco. She reports that she does not drink alcohol and does not use drugs.  Past Medical History:  Diagnosis Date   Diabetes mellitus    type II   Down syndrome    Family history of adverse reaction to anesthesia    mom has had n/v   Gastritis    Headache    Hepatic steatosis    Hidradenitis    Hypothyroidism    Irritable bowel syndrome    Periumbilical hernia    Pneumonia 03/2020   frequent    Restless legs    Tendonitis    chronic in left foot   Thyroid disease    hypothyroidism    Past Surgical History:  Procedure Laterality Date   AXILLARY HIDRADENITIS EXCISION Bilateral    CHOLECYSTECTOMY     HERNIA REPAIR     multiple   INCISIONAL HERNIA REPAIR  05/30/2015    LAPROSCOPIC   INCISIONAL HERNIA REPAIR N/A 05/30/2015   Procedure: LAPAROSCOPIC INCISIONAL HERNIA;  Surgeon: Rolm Bookbinder, MD;  Location: [facility];  Service: General;  Laterality: N/A;   INSERTION OF MESH N/A 05/30/2015   Procedure: INSERTION OF MESH;  Surgeon: Rolm Bookbinder, MD;  Location: [place];  Service: General;  Laterality: N/A;   KNEE ARTHROSCOPY     right knee   LAPAROSCOPY N/A 05/30/2015   Procedure: LAPAROSCOPY DIAGNOSTIC;  Surgeon: Rolm Bookbinder, MD;  Location: [facility];  Service: General;  Laterality: N/A;   PARTIAL KNEE ARTHROPLASTY Right 10/30/2017   Procedure: UNICOMPARTMENTAL RIGHT KNEE LATERAL;  Surgeon: Paralee Cancel, MD;  Location: [facility];  Service: Orthopedics;  Laterality: Right;  90 mins   PARTIAL KNEE ARTHROPLASTY Left 06/29/2020   Procedure: UNICOMPARTMENTAL KNEE LATERALLY;  Surgeon: Paralee Cancel, MD;  Location: [facility];  Service: Orthopedics;  Laterality: Left;  70 MINS   TONSILLECTOMY     adenoids    Current Outpatient Medications  Medication Sig Dispense Refill   acetaminophen (TYLENOL) 325 MG tablet Take 650 mg by mouth every 6 (six) hours as needed for mild pain.      allopurinol (ZYLOPRIM) 300 MG tablet Take 1 tablet (300 mg total) by mouth daily. 90 tablet 1   Blood Glucose Monitoring Suppl (  BLOOD GLUCOSE METER KIT AND SUPPLIES) Dispense based on patient and insurance preference. To check blood sugars daily FOR ICD-9 250.00 (Patient taking differently: 1 each by Other route See admin instructions. Dispense based on patient and insurance preference. To check blood sugars daily FOR ICD-9 250.00) 1 each 0   cholestyramine (QUESTRAN) 4 g packet DISSOLVE AND TAKE 1 PACKET OF POWDER BY MOUTH TWICE DAILY 180 each 3   dicyclomine (BENTYL) 20 MG tablet Take 20 mg by mouth daily as needed for spasms.     empagliflozin (JARDIANCE) 25 MG TABS tablet Take 1 tablet (25 mg total) by mouth daily. 90 tablet 3   levonorgestrel-ethinyl estradiol (JOLESSA) 0.15-0.03 MG tablet  Take 1 tablet by mouth once daily 84 tablet 3   levothyroxine (SYNTHROID) 150 MCG tablet Take 1 tablet (150 mcg total) by mouth daily before breakfast. 30 tablet 3   metFORMIN (GLUCOPHAGE) 1000 MG tablet Take 1 tablet (1,000 mg total) by mouth 2 (two) times daily. 180 tablet 2   methocarbamol (ROBAXIN) 500 MG tablet Take 1 tablet (500 mg total) by mouth every 6 (six) hours as needed for muscle spasms. 40 tablet 0   norethindrone (AYGESTIN) 5 MG tablet Take 1 tablet (5 mg total) by mouth in the morning and at bedtime. 60 tablet 1   nystatin (MYCOSTATIN/NYSTOP) powder Apply 1 application. topically 3 (three) times daily. 60 g 1   tirzepatide (MOUNJARO) 7.5 MG/0.5ML Pen 7.22m     Glucose Blood (BLOOD GLUCOSE TEST STRIPS) STRP 1 each by Other route See admin instructions. Use to test blood sugar up to twice a day 100 strip 3   glucose blood (FREESTYLE TEST STRIPS) test strip Use as directed to test blood sugar up to twice a day. Dx code E11.9 100 each 12   glucose blood (TRUE METRIX BLOOD GLUCOSE TEST) test strip Use as instructed to check glucose up to 2 times daily. Dx Code E11.9 100 each 12   ONE TOUCH ULTRA TEST test strip USE TO CHECK BLOOD SUGAR ONCE DAILY (ICD CODE:25.00) (Patient taking differently: 1 each by Other route daily.) 100 each 3   TRUEplus Lancets 30G MISC Use as directed to check glucose up to two times daily. Dx code E11.9 100 each 3   No current facility-administered medications for this visit.    Family History  Problem Relation Age of Onset   Thyroid cancer Mother    Diabetes type II Mother    High Cholesterol Mother    Heart disease Mother    Diabetes type II Father    Hypertension Father    Stroke Father    ROS: Genitourinary:positive for irregular bleeding  Exam:   BP 119/82 (BP Location: Left Arm, Patient Position: Sitting, Cuff Size: Large)   Pulse 86   Ht '5\' 4"'  (1.626 m) Comment: Reported  Wt 231 lb 3.2 oz (104.9 kg)   BMI 39.69 kg/m   Height: '5\' 4"'  (162.6  cm) (Reported)  General appearance: alert, cooperative and appears stated age Abdomen: soft, non-tender; bowel sounds normal; no masses,  no organomegaly, multiple surgical scars present  Assessment/Plan: 1. Irregular bleeding - will start progesterone and consider stopping OCPs - norethindrone (AYGESTIN) 5 MG tablet; Take 1 tablet (5 mg total) by mouth in the morning and at bedtime.  Dispense: 60 tablet; Refill: 1 - [place]LVIS (TRANSABDOMINAL ONLY); Future  2. Class 2 obesity due to excess calories without serious comorbidity with body mass index (BMI) of 39.0 to 39.9 in adult  3.  Down syndrome  4. Hernia of abdominal wall - s/p attempted repair several times   Total time with pt/family and documentation: 34 minutes

## 2021-08-10 ENCOUNTER — Other Ambulatory Visit: Payer: Self-pay | Admitting: Family Medicine

## 2021-08-16 ENCOUNTER — Ambulatory Visit
Admission: RE | Admit: 2021-08-16 | Discharge: 2021-08-16 | Disposition: A | Discharge status: Home / Self Care | Payer: Medicare HMO | Source: Ambulatory Visit | Attending: Obstetrics & Gynecology | Admitting: Obstetrics & Gynecology

## 2021-08-16 DIAGNOSIS — N926 Irregular menstruation, unspecified: Secondary | ICD-10-CM

## 2021-08-16 DIAGNOSIS — N939 Abnormal uterine and vaginal bleeding, unspecified: Secondary | ICD-10-CM | POA: Diagnosis not present

## 2021-08-17 ENCOUNTER — Other Ambulatory Visit (HOSPITAL_BASED_OUTPATIENT_CLINIC_OR_DEPARTMENT_OTHER): Payer: Self-pay | Admitting: Obstetrics & Gynecology

## 2021-08-17 DIAGNOSIS — N926 Irregular menstruation, unspecified: Secondary | ICD-10-CM

## 2021-08-22 NOTE — Progress Notes (Signed)
[facility] [place], [place], [place] [address] [phone]  Date:  08/27/2021   Name:  Wanda Oneill   DOB:  11-15-1980   MRN:  [id number]  PCP:  Darreld Mclean, MD    Chief Complaint: Hip Pain (Right side x 1 month after lifting something heavy.)   History of Present Illness:  Wanda Oneill is a 41 y.o. very pleasant female patient who presents with the following:  Elissa is a charming woman with Down syndrome, daughter of my former partner Dr. Ruben Reason History of diabetes, hypothyroidism, obesity, Down syndrome and several associated MSK problems  Seen today for follow-up and to discuss hip pain -most recent visit with myself was in April At that time we attempted to obtain a Pap smear for evaluation of irregular menstrual bleeding, patient was not able to tolerate pelvic exam and has since been to see gynecology Dr. Sabra Heck saw her at the end of June-they obtained a transabdominal ultrasound.  I am not sure if this was adequate or further evaluation under anesthesia may be necessary We also did a biopsy of her scalp, unfortunately diagnosed skin cancer- they plan to do her Mohs on the 8th of August   About one month ago she lifted her walker into the car and had sudden right sided hip pain- she felt a distinct pop She has more pain is she sits for a long period of time The right hip is ok The pain is perhaps better since it occurred  For pain she has used some aleve  She also has some back pain and knee pain which is baseline   Eye exam- UTD  Patient Active Problem List   Diagnosis Date Noted   Bilateral trigger thumb 06/18/2021   Chronic paronychia of finger 01/25/2021   S/P left unicompartmental knee replacement, lateral 06/29/2020   At risk for obstructive sleep apnea 06/11/2019   Cough 06/11/2019   Hyperglycemia 05/26/2019   Acute hypoxemic respiratory failure ([place]) 05/20/2019   Fibromyalgia  03/09/2019   Neck pain, chronic 03/09/2019   Acute shoulder bursitis, right 02/03/2019   Bile salt-induced diarrhea 09/25/2018   Arthritis of left acromioclavicular joint 09/23/2018   Precordial chest pain 08/11/2018   Morbid obesity ([place]) 10/31/2017   S/P right UKR, lateral 10/30/2017   Incarcerated epigastric hernia 05/30/2015   Left foot pain 08/09/2014   Chronic diarrhea 07/27/2014   Gout 08/10/2012   Acid reflux 01/22/2012   Down syndrome 11/11/2011   Hernia of abdominal wall 10/21/2011   Psoriasis 10/21/2011   Type 2 diabetes mellitus, uncontrolled 04/12/2011   Hypothyroid 04/12/2011   Gait abnormality 06/22/2010   Tibial tendinitis, posterior 06/22/2010    Past Medical History:  Diagnosis Date   Diabetes mellitus    type II   Down syndrome    Family history of adverse reaction to anesthesia    mom has had n/v   Gastritis    Headache    Hepatic steatosis    Hidradenitis    Hypothyroidism    Irritable bowel syndrome    Periumbilical hernia    Pneumonia 03/2020   frequent    Restless legs    Tendonitis    chronic in left foot   Thyroid disease    hypothyroidism    Past Surgical History:  Procedure Laterality Date   AXILLARY HIDRADENITIS EXCISION Bilateral    CHOLECYSTECTOMY     HERNIA REPAIR  multiple   INCISIONAL HERNIA REPAIR  05/30/2015   LAPROSCOPIC   INCISIONAL HERNIA REPAIR N/A 05/30/2015   Procedure: LAPAROSCOPIC INCISIONAL HERNIA;  Surgeon: Rolm Bookbinder, MD;  Location: [facility];  Service: General;  Laterality: N/A;   INSERTION OF MESH N/A 05/30/2015   Procedure: INSERTION OF MESH;  Surgeon: Rolm Bookbinder, MD;  Location: [place];  Service: General;  Laterality: N/A;   KNEE ARTHROSCOPY     right knee   LAPAROSCOPY N/A 05/30/2015   Procedure: LAPAROSCOPY DIAGNOSTIC;  Surgeon: Rolm Bookbinder, MD;  Location: [facility];  Service: General;  Laterality: N/A;   PARTIAL KNEE ARTHROPLASTY Right 10/30/2017   Procedure: UNICOMPARTMENTAL RIGHT KNEE  LATERAL;  Surgeon: Paralee Cancel, MD;  Location[facility];  Service: Orthopedics;  Laterality: Right;  90 mins   PARTIAL KNEE ARTHROPLASTY Left 06/29/2020   Procedure: UNICOMPARTMENTAL KNEE LATERALLY;  Surgeon: Paralee Cancel, MD;  Location: [facility];  Service: Orthopedics;  Laterality: Left;  70 MINS   TONSILLECTOMY     adenoids    Social History   Tobacco Use   Smoking status: Never   Smokeless tobacco: Never  Vaping Use   Vaping Use: Never used  Substance Use Topics   Alcohol use: No    Alcohol/week: 0.0 standard drinks of alcohol   Drug use: No    Family History  Problem Relation Age of Onset   Thyroid cancer Mother    Diabetes type II Mother    High Cholesterol Mother    Heart disease Mother    Diabetes type II Father    Hypertension Father    Stroke Father     Allergies  Allergen Reactions   Vancomycin Itching   Cefuroxime Axetil Hives   Sulfa Antibiotics Hives    Medication list has been reviewed and updated.  Current Outpatient Medications on File Prior to Visit  Medication Sig Dispense Refill   acetaminophen (TYLENOL) 325 MG tablet Take 650 mg by mouth every 6 (six) hours as needed for mild pain.      allopurinol (ZYLOPRIM) 300 MG tablet Take 1 tablet (300 mg total) by mouth daily. 90 tablet 1   Blood Glucose Monitoring Suppl (BLOOD GLUCOSE METER KIT AND SUPPLIES) Dispense based on patient and insurance preference. To check blood sugars daily FOR ICD-9 250.00 (Patient taking differently: 1 each by Other route See admin instructions. Dispense based on patient and insurance preference. To check blood sugars daily FOR ICD-9 250.00) 1 each 0   cholestyramine (QUESTRAN) 4 g packet DISSOLVE AND TAKE 1 PACKET OF POWDER BY MOUTH TWICE DAILY 180 each 3   dicyclomine (BENTYL) 20 MG tablet Take 20 mg by mouth daily as needed for spasms.     empagliflozin (JARDIANCE) 25 MG TABS tablet Take 1 tablet (25 mg total) by mouth daily. 90 tablet 3   Glucose Blood (BLOOD GLUCOSE TEST  STRIPS) STRP 1 each by Other route See admin instructions. Use to test blood sugar up to twice a day 100 strip 3   glucose blood (FREESTYLE TEST STRIPS) test strip Use as directed to test blood sugar up to twice a day. Dx code E11.9 100 each 12   glucose blood (TRUE METRIX BLOOD GLUCOSE TEST) test strip Use as instructed to check glucose up to 2 times daily. Dx Code E11.9 100 each 12   levonorgestrel-ethinyl estradiol (JOLESSA) 0.15-0.03 MG tablet Take 1 tablet by mouth once daily 84 tablet 3   levothyroxine (SYNTHROID) 150 MCG tablet TAKE 1 TABLET EVERY DAY BEFORE BREAKFAST  90 tablet 1   metFORMIN (GLUCOPHAGE) 1000 MG tablet Take 1 tablet (1,000 mg total) by mouth 2 (two) times daily. 180 tablet 2   methocarbamol (ROBAXIN) 500 MG tablet Take 1 tablet (500 mg total) by mouth every 6 (six) hours as needed for muscle spasms. 40 tablet 0   norethindrone (AYGESTIN) 5 MG tablet Take 1 tablet (5 mg total) by mouth in the morning and at bedtime. 60 tablet 1   nystatin (MYCOSTATIN/NYSTOP) powder Apply 1 application. topically 3 (three) times daily. 60 g 1   ONE TOUCH ULTRA TEST test strip USE TO CHECK BLOOD SUGAR ONCE DAILY (ICD CODE:25.00) (Patient taking differently: 1 each by Other route daily.) 100 each 3   tirzepatide (MOUNJARO) 7.5 MG/0.5ML Pen 7.73m     TRUEplus Lancets 30G MISC Use as directed to check glucose up to two times daily. Dx code E11.9 100 each 3   No current facility-administered medications on file prior to visit.    Review of Systems:  As per HPI- otherwise negative.   Physical Examination: Vitals:   08/27/21 1335  BP: 124/60  Pulse: 83  Resp: 18  Temp: 97.7 F (36.5 C)  SpO2: 98%   Vitals:   08/27/21 1335  Weight: 234 lb 9.6 oz (106.4 kg)  Height: '5\' 4"'  (1.626 m)   Body mass index is 40.27 kg/m. Ideal Body Weight: Weight in (lb) to have BMI = 25: 145.3  GEN: no acute distress.  Obese, looks well  HEENT: Atraumatic, Normocephalic.  Ears and Nose: No external  deformity. CV: RRR, No M/G/R. No JVD. No thrill. No extra heart sounds. PULM: CTA B, no wheezes, crackles, rhonchi. No retractions. No resp. distress. No accessory muscle use. ABD: S, NT, ND EXTR: No c/c/e PSYCH: Normally interactive. Conversant.  Pt indicates he right sided lower back/ over the SI joint as the location of her pain TL flexion and extension normal for pt Normal BLE strength and sensation Negative SLR Belly is benign   Assessment and Plan: Right hip pain - Plan: DG Hip Unilat W OR W/O Pelvis 2-3 Views Right, DG Si Joints, Ambulatory referral to Sports Medicine, CANCELED: Ambulatory referral to Sports Medicine Pt seen today with likely SI joint pain Referral made to her sports med doctor, Dr "T" with [facility] In the meantime ok to continue aleve, can try diclofenac gel   Signed JLamar Blinks MD  Received her x-rays as follows, message sent to her mother  DG Hip Unilat W OR W/O Pelvis 2-3 Views Right  Result Date: 08/27/2021 CLINICAL DATA:  Hip pain EXAM: DG HIP (WITH OR WITHOUT PELVIS) 2-3V RIGHT COMPARISON:  None Available. FINDINGS: There is no evidence of hip fracture or dislocation. There is no evidence of arthropathy or other focal bone abnormality. Postoperative changes of hernia repair. IMPRESSION: No acute fracture or dislocation of the right hip. Electronically Signed   By: TPlacido SouM.D.   On: 08/27/2021 14:53   DG Si Joints  Result Date: 08/27/2021 CLINICAL DATA:  Pain in right SI joint EXAM: BILATERAL SACROILIAC JOINTS - 3+ VIEW COMPARISON:  None Available. FINDINGS: The sacroiliac joint spaces are maintained and there is no evidence of arthropathy. No other bone abnormalities are seen. IMPRESSION: Negative. Electronically Signed   By: TPlacido SouM.D.   On: 08/27/2021 14:50

## 2021-08-27 ENCOUNTER — Ambulatory Visit (INDEPENDENT_AMBULATORY_CARE_PROVIDER_SITE_OTHER): Payer: Medicare HMO | Admitting: Family Medicine

## 2021-08-27 ENCOUNTER — Ambulatory Visit (HOSPITAL_BASED_OUTPATIENT_CLINIC_OR_DEPARTMENT_OTHER)
Admission: RE | Admit: 2021-08-27 | Discharge: 2021-08-27 | Disposition: A | Discharge status: Home / Self Care | Payer: Medicare HMO | Source: Ambulatory Visit | Attending: Family Medicine | Admitting: Family Medicine

## 2021-08-27 VITALS — BP 124/60 | HR 83 | Temp 97.7°F | Resp 18 | Ht 64.0 in | Wt 234.6 lb

## 2021-08-27 DIAGNOSIS — M25551 Pain in right hip: Secondary | ICD-10-CM

## 2021-08-27 DIAGNOSIS — M533 Sacrococcygeal disorders, not elsewhere classified: Secondary | ICD-10-CM | POA: Diagnosis not present

## 2021-08-27 NOTE — Patient Instructions (Signed)
It was good to see you today!   Please go to the ground floor imaging dept for x-rays today For your hip pain ok to try Aleve,diclofenac gel as needed We will also get you in to see DR T to possibly inject your SI joint

## 2021-09-11 ENCOUNTER — Ambulatory Visit: Payer: Medicare HMO | Admitting: Sports Medicine

## 2021-09-12 ENCOUNTER — Ambulatory Visit: Payer: Medicare HMO | Admitting: Family Medicine

## 2021-09-13 DIAGNOSIS — C4442 Squamous cell carcinoma of skin of scalp and neck: Secondary | ICD-10-CM | POA: Diagnosis not present

## 2021-09-25 ENCOUNTER — Telehealth (HOSPITAL_BASED_OUTPATIENT_CLINIC_OR_DEPARTMENT_OTHER): Payer: Self-pay | Admitting: *Deleted

## 2021-09-25 DIAGNOSIS — E781 Pure hyperglyceridemia: Secondary | ICD-10-CM | POA: Diagnosis not present

## 2021-09-25 DIAGNOSIS — E1165 Type 2 diabetes mellitus with hyperglycemia: Secondary | ICD-10-CM | POA: Diagnosis not present

## 2021-09-25 DIAGNOSIS — N926 Irregular menstruation, unspecified: Secondary | ICD-10-CM

## 2021-09-25 DIAGNOSIS — E010 Iodine-deficiency related diffuse (endemic) goiter: Secondary | ICD-10-CM | POA: Diagnosis not present

## 2021-09-25 DIAGNOSIS — E039 Hypothyroidism, unspecified: Secondary | ICD-10-CM | POA: Diagnosis not present

## 2021-09-25 MED ORDER — NORETHINDRONE ACETATE 5 MG PO TABS: 5.0000 mg | ORAL_TABLET | Freq: Two times a day (BID) | ORAL | 1 refills | Status: DC

## 2021-09-25 MED ORDER — NORETHINDRONE ACETATE 5 MG PO TABS: 5.0000 mg | ORAL_TABLET | Freq: Two times a day (BID) | ORAL | 3 refills | Status: DC

## 2021-09-25 NOTE — Telephone Encounter (Signed)
TC from patient's mom.  Pt transitioned off OCP 2 weeks ago and has continued the Aygestin 5 mg BID.  No bleeding since off OCP.  Asking for short term RF to [facility] then long term prescription to [facility].  Please advise prescription - verbally per Dr Sabra Heck Aygestin 5 mg BID. KW CMA

## 2021-09-30 NOTE — Progress Notes (Unsigned)
[facility] [address], [place], [place] [address] [phone]  Date:  10/01/2021   Name:  Wanda Oneill   DOB:  [birth year]   MRN:  [id number]  PCP:  Darreld Mclean, MD    Chief Complaint: lesion on abdomen   History of Present Illness:  Wanda Oneill is a 41 y.o. very pleasant female patient who presents with the following:  Wanda Oneill is a charming woman with Down syndrome, daughter of my former partner Dr. Ruben Reason History of diabetes, hypothyroidism, obesity, Down syndrome and several associated MSK problems  Ivanna is seen today with concern of a skin issue She was recently treated for a skin cancer on her scalp with Mohs surgery She is also being seen by Dr. Hale Bogus with gynecology for irregular menstrual bleeding Most recent visit with myself was in July  About 8 days ago her parents noticed a skin lesion on her left abdomen.  They are not sure how long has been there, but it looked unusual and concerned her dad.  They are especially concerned since she recently had skin cancer.  They would like this lesion removed today if possible Patient Active Problem List   Diagnosis Date Noted   Bilateral trigger thumb 06/18/2021   Chronic paronychia of finger 01/25/2021   S/P left unicompartmental knee replacement, lateral 06/29/2020   At risk for obstructive sleep apnea 06/11/2019   Cough 06/11/2019   Hyperglycemia 05/26/2019   Acute hypoxemic respiratory failure ([place]) 05/20/2019   Fibromyalgia 03/09/2019   Neck pain, chronic 03/09/2019   Acute shoulder bursitis, right 02/03/2019   Bile salt-induced diarrhea 09/25/2018   Arthritis of left acromioclavicular joint 09/23/2018   Precordial chest pain 08/11/2018   Morbid obesity ([place]) 10/31/2017   S/P right UKR, lateral 10/30/2017   Incarcerated epigastric hernia 05/30/2015   Left foot pain 08/09/2014   Chronic diarrhea 07/27/2014   Gout 08/10/2012    Acid reflux 01/22/2012   Down syndrome 11/11/2011   Hernia of abdominal wall 10/21/2011   Psoriasis 10/21/2011   Type 2 diabetes mellitus, uncontrolled 04/12/2011   Hypothyroid 04/12/2011   Gait abnormality 06/22/2010   Tibial tendinitis, posterior 06/22/2010    Past Medical History:  Diagnosis Date   Diabetes mellitus    type II   Down syndrome    Family history of adverse reaction to anesthesia    mom has had n/v   Gastritis    Headache    Hepatic steatosis    Hidradenitis    Hypothyroidism    Irritable bowel syndrome    Periumbilical hernia    Pneumonia 03/2020   frequent    Restless legs    Tendonitis    chronic in left foot   Thyroid disease    hypothyroidism    Past Surgical History:  Procedure Laterality Date   AXILLARY HIDRADENITIS EXCISION Bilateral    CHOLECYSTECTOMY     HERNIA REPAIR     multiple   INCISIONAL HERNIA REPAIR  05/30/2015   LAPROSCOPIC   INCISIONAL HERNIA REPAIR N/A 05/30/2015   Procedure: LAPAROSCOPIC INCISIONAL HERNIA;  Surgeon: Rolm Bookbinder, MD;  Location: [facility];  Service: General;  Laterality: N/A;   INSERTION OF MESH N/A 05/30/2015   Procedure: INSERTION OF MESH;  Surgeon: Rolm Bookbinder, MD;  Location: [facility];  Service: General;  Laterality: N/A;   KNEE ARTHROSCOPY     right knee   LAPAROSCOPY N/A 05/30/2015  Procedure: LAPAROSCOPY DIAGNOSTIC;  Surgeon: Rolm Bookbinder, MD;  Location: [facility];  Service: General;  Laterality: N/A;   PARTIAL KNEE ARTHROPLASTY Right 10/30/2017   Procedure: UNICOMPARTMENTAL RIGHT KNEE LATERAL;  Surgeon: Paralee Cancel, MD;  Location: [facility];  Service: Orthopedics;  Laterality: Right;  90 mins   PARTIAL KNEE ARTHROPLASTY Left 06/29/2020   Procedure: UNICOMPARTMENTAL KNEE LATERALLY;  Surgeon: Paralee Cancel, MD;  Location: [facility];  Service: Orthopedics;  Laterality: Left;  70 MINS   TONSILLECTOMY     adenoids    Social History   Tobacco Use   Smoking status: Never   Smokeless tobacco: Never  Vaping  Use   Vaping Use: Never used  Substance Use Topics   Alcohol use: No    Alcohol/week: 0.0 standard drinks of alcohol   Drug use: No    Family History  Problem Relation Age of Onset   Thyroid cancer Mother    Diabetes type II Mother    High Cholesterol Mother    Heart disease Mother    Diabetes type II Father    Hypertension Father    Stroke Father     Allergies  Allergen Reactions   Vancomycin Itching   Cefuroxime Axetil Hives   Sulfa Antibiotics Hives    Medication list has been reviewed and updated.  Current Outpatient Medications on File Prior to Visit  Medication Sig Dispense Refill   acetaminophen (TYLENOL) 325 MG tablet Take 650 mg by mouth every 6 (six) hours as needed for mild pain.      allopurinol (ZYLOPRIM) 300 MG tablet Take 1 tablet (300 mg total) by mouth daily. 90 tablet 1   Blood Glucose Monitoring Suppl (BLOOD GLUCOSE METER KIT AND SUPPLIES) Dispense based on patient and insurance preference. To check blood sugars daily FOR ICD-9 250.00 (Patient taking differently: 1 each by Other route See admin instructions. Dispense based on patient and insurance preference. To check blood sugars daily FOR ICD-9 250.00) 1 each 0   cholestyramine (QUESTRAN) 4 g packet DISSOLVE AND TAKE 1 PACKET OF POWDER BY MOUTH TWICE DAILY 180 each 3   dicyclomine (BENTYL) 20 MG tablet Take 20 mg by mouth daily as needed for spasms.     empagliflozin (JARDIANCE) 25 MG TABS tablet Take 1 tablet (25 mg total) by mouth daily. 90 tablet 3   Glucose Blood (BLOOD GLUCOSE TEST STRIPS) STRP 1 each by Other route See admin instructions. Use to test blood sugar up to twice a day 100 strip 3   glucose blood (FREESTYLE TEST STRIPS) test strip Use as directed to test blood sugar up to twice a day. Dx code E11.9 100 each 12   glucose blood (TRUE METRIX BLOOD GLUCOSE TEST) test strip Use as instructed to check glucose up to 2 times daily. Dx Code E11.9 100 each 12   levonorgestrel-ethinyl estradiol  (JOLESSA) 0.15-0.03 MG tablet Take 1 tablet by mouth once daily 84 tablet 3   levothyroxine (SYNTHROID) 150 MCG tablet TAKE 1 TABLET EVERY DAY BEFORE BREAKFAST 90 tablet 1   metFORMIN (GLUCOPHAGE) 1000 MG tablet Take 1 tablet (1,000 mg total) by mouth 2 (two) times daily. 180 tablet 2   methocarbamol (ROBAXIN) 500 MG tablet Take 1 tablet (500 mg total) by mouth every 6 (six) hours as needed for muscle spasms. 40 tablet 0   norethindrone (AYGESTIN) 5 MG tablet Take 1 tablet (5 mg total) by mouth in the morning and at bedtime. 180 tablet 3   nystatin (MYCOSTATIN/NYSTOP) powder Apply 1 application.  topically 3 (three) times daily. 60 g 1   ONE TOUCH ULTRA TEST test strip USE TO CHECK BLOOD SUGAR ONCE DAILY (ICD CODE:25.00) (Patient taking differently: 1 each by Other route daily.) 100 each 3   tirzepatide (MOUNJARO) 7.5 MG/0.5ML Pen 7.100m     TRUEplus Lancets 30G MISC Use as directed to check glucose up to two times daily. Dx code E11.9 100 each 3   No current facility-administered medications on file prior to visit.    Review of Systems:  As per HPI- otherwise negative.   Physical Examination: Vitals:   10/01/21 1405  BP: 122/80  Pulse: 80  Resp: 20  Temp: 97.8 F (36.6 C)  SpO2: 97%   Vitals:   10/01/21 1405  Weight: 232 lb 3.2 oz (105.3 kg)   Body mass index is 39.86 kg/m. Ideal Body Weight:   GEN: No acute distress; alert,appropriate. PULM: Breathing comfortably in no respiratory distress PSYCH: Normally interactive.  Patient has a small skin lesion of uncertain etiology in her left abdomen, it measures approximately 4 mm x 5 mm.  Verbal consent for shave biopsy obtained from patient and her mom   Prepped skin with Betadine and alcohol.  Anesthesia obtained with 1.5 mL of 1% lidocaine with epinephrine.  Lesion was grasped with forceps and derma blade used to shave off skin lesion.  Used silver nitrate sticks to obtain hemostasis, placed Band-Aid.  Estimated blood loss less  than 5 mL.  Patient tolerated well with no immediate complication, specimen to pathology Assessment and Plan: Skin lesion, superficial - Plan: Dermatology pathology  Shave biopsy of skin lesion, uncertain type as above. Discussed wound care with patient and her mom, they will contact me if any concerns Otherwise I will be in touch with results from path  Signed JLamar Blinks MD

## 2021-10-01 ENCOUNTER — Ambulatory Visit (INDEPENDENT_AMBULATORY_CARE_PROVIDER_SITE_OTHER): Payer: Medicare HMO | Admitting: Family Medicine

## 2021-10-01 VITALS — BP 122/80 | HR 80 | Temp 97.8°F | Resp 20 | Wt 232.2 lb

## 2021-10-01 DIAGNOSIS — D181 Lymphangioma, any site: Secondary | ICD-10-CM

## 2021-10-01 DIAGNOSIS — L989 Disorder of the skin and subcutaneous tissue, unspecified: Secondary | ICD-10-CM

## 2021-10-25 ENCOUNTER — Telehealth: Payer: Self-pay | Admitting: Sports Medicine

## 2021-10-25 NOTE — Telephone Encounter (Signed)
Family has covid and will give [place] a call back as soon as the family is all better. Tvt

## 2021-10-25 NOTE — Telephone Encounter (Signed)
Please schedule Wanda Oneill with me for her SI joint pain, she was referred by her PCP.

## 2021-11-02 ENCOUNTER — Ambulatory Visit (INDEPENDENT_AMBULATORY_CARE_PROVIDER_SITE_OTHER): Payer: Medicare HMO | Admitting: Sports Medicine

## 2021-11-02 ENCOUNTER — Ambulatory Visit (INDEPENDENT_AMBULATORY_CARE_PROVIDER_SITE_OTHER): Payer: Medicare HMO

## 2021-11-02 DIAGNOSIS — M545 Low back pain, unspecified: Secondary | ICD-10-CM

## 2021-11-02 DIAGNOSIS — G8929 Other chronic pain: Secondary | ICD-10-CM

## 2021-11-02 DIAGNOSIS — M47816 Spondylosis without myelopathy or radiculopathy, lumbar region: Secondary | ICD-10-CM | POA: Insufficient documentation

## 2021-11-02 NOTE — Assessment & Plan Note (Signed)
This is a very pleasant 73 or female, chronic axial right-sided low back pain worse with standing, I did review a CT from several years ago that did show widespread spondylitic processes including right SI joint osteoarthritis, multilevel lower lumbar facet arthritis and upper lumbar/lower thoracic degenerative disc disease. On exam she has tenderness predominantly at the right sacroiliac joint. Today we injected her right sacroiliac joint for diagnostic and therapeutic purposes, she can do Tylenol as needed, avoiding NSAIDs for now, I will also add some home physical therapy, she is homebound and unable to drive. Return to see me about 4 weeks, will consider lumbar spine MRI for further interventional planning, likely facet joints if insufficient improvement.

## 2021-11-02 NOTE — Progress Notes (Signed)
    Procedures performed today:    Procedure: Real-time Ultrasound Guided injection of the right SI joint Device: Samsung [id number]  Verbal informed consent obtained.  Time-out conducted.  Noted no overlying erythema, induration, or other signs of local infection.  Skin prepped in a sterile fashion.  Local anesthesia: Topical Ethyl chloride.  With sterile technique and under real time ultrasound guidance: Noted normal-appearing SI joint, 1 cc Kenalog 40, 2 cc lidocaine, 2 cc bupivacaine injected easily Completed without difficulty  Advised to call if fevers/chills, erythema, induration, drainage, or persistent bleeding.  Images permanently stored and available for review in PACS.  Impression: Technically successful ultrasound guided injection.  Independent interpretation of notes and tests performed by another provider:   None.  Brief History, Exam, Impression, and Recommendations:    Chronic right-sided low back pain This is a very pleasant 44 or female, chronic axial right-sided low back pain worse with standing, I did review a CT from several years ago that did show widespread spondylitic processes including right SI joint osteoarthritis, multilevel lower lumbar facet arthritis and upper lumbar/lower thoracic degenerative disc disease. On exam she has tenderness predominantly at the right sacroiliac joint. Today we injected her right sacroiliac joint for diagnostic and therapeutic purposes, she can do Tylenol as needed, avoiding NSAIDs for now, I will also add some home physical therapy, she is homebound and unable to drive. Return to see me about 4 weeks, will consider lumbar spine MRI for further interventional planning, likely facet joints if insufficient improvement.    ____________________________________________ Gwen Her. Dianah Field, M.D., ABFM., CAQSM., AME. Primary Care and Sports Medicine [facility]  Adjunct Professor of [employer]  Risk manager

## 2021-11-16 ENCOUNTER — Other Ambulatory Visit: Payer: Self-pay | Admitting: Family Medicine

## 2021-12-04 ENCOUNTER — Telehealth: Payer: Self-pay | Admitting: Family Medicine

## 2021-12-04 MED ORDER — AMOXICILLIN-POT CLAVULANATE 875-125 MG PO TABS: 1.0000 | ORAL_TABLET | Freq: Two times a day (BID) | ORAL | 0 refills | Status: DC

## 2021-12-04 NOTE — Telephone Encounter (Signed)
Received message from pt's mother about a possible infection around her toe. Called her mom Wanda Oneill  Doxycycline has caused some GI irritation She is able to tolerate augmentin- not allergic to amox or augmentin [facility]  They will let me know if not improving  Meds ordered this encounter  Medications   amoxicillin-clavulanate (AUGMENTIN) 875-125 MG tablet    Sig: Take 1 tablet by mouth 2 (two) times daily.    Dispense:  20 tablet    Refill:  0

## 2021-12-11 ENCOUNTER — Other Ambulatory Visit: Payer: Self-pay | Admitting: Family Medicine

## 2021-12-11 MED ORDER — METAXALONE 800 MG PO TABS: 800.0000 mg | ORAL_TABLET | Freq: Three times a day (TID) | ORAL | 1 refills | Status: DC | PRN

## 2022-01-03 ENCOUNTER — Other Ambulatory Visit: Payer: Self-pay | Admitting: Family Medicine

## 2022-01-07 ENCOUNTER — Telehealth: Payer: Self-pay

## 2022-01-07 NOTE — Telephone Encounter (Signed)
The drug you asked for is not listed in your preferred drug list (formulary). The preferred drug(s), you may not have tried, are: carisoprodol 350 mg tablet, methocarbamol tablet, and tizanidine tablet. Your provider needs to give [place] medical reasons why the preferred drug(s) would not work for you and/or would have bad side effects. Sometimes a preferred drug needs more review for approval. Additionally, some preferred drugs listed may be the same drugs with different strengths or forms. [employer] may only require one strength or form of that drug to be tried. This decision was from [facility] Non-Formulary Exceptions Coverage Policy.  Appeal has been started since she has tried Methocarbamol.

## 2022-01-09 ENCOUNTER — Ambulatory Visit (INDEPENDENT_AMBULATORY_CARE_PROVIDER_SITE_OTHER): Payer: Medicare HMO

## 2022-01-09 VITALS — Wt 232.0 lb

## 2022-01-09 DIAGNOSIS — Z0000 Encounter for general adult medical examination without abnormal findings: Secondary | ICD-10-CM

## 2022-01-09 NOTE — Progress Notes (Addendum)
I connected with  Wanda Oneill on 01/09/22 by a audio enabled telemedicine application and verified that I am speaking with the correct person using two identifiers. Wanda Oneill her mother   Patient Location: Home  Provider Location: Home Office  I discussed the limitations of evaluation and management by telemedicine. The patient expressed understanding and agreed to proceed.   Subjective:   Wanda Oneill is a 41 y.o. female who presents for Medicare Annual (Subsequent) preventive examination.  Review of Systems     Cardiac Risk Factors include: advanced age (>61mn, >>48women);diabetes mellitus;obesity (BMI >30kg/m2)     Objective:    Today's Vitals   01/09/22 1457  Weight: 232 lb (105.2 kg)   Body mass index is 39.82 kg/m.     01/09/2022    3:09 PM 11/26/2020    2:27 PM 10/25/2020   11:48 AM 06/29/2020    5:00 PM 06/23/2020   11:40 AM 02/01/2020    4:43 PM 05/27/2019    6:11 PM  Advanced Directives  Does Patient Have a Medical Advance Directive? _0  Yes No  Type of Advance Directive      [place]  Does patient want to make changes to medical advance directive?   Yes (MAU/Ambulatory/Procedural Areas - Information given)      Would patient like information on creating a medical advance directive? No - Patient declined Yes (ED - Information included in AVS)  No - Patient declined No - Patient declined  No - Patient declined    Current Medications (verified) Outpatient Encounter Medications as of 01/09/2022  Medication Sig   acetaminophen (TYLENOL) 325 MG tablet Take 650 mg by mouth every 6 (six) hours as needed for mild pain.    allopurinol (ZYLOPRIM) 300 MG tablet TAKE 1 TABLET EVERY DAY   Blood Glucose Monitoring Suppl (BLOOD GLUCOSE METER KIT AND SUPPLIES) Dispense based on patient and insurance preference. To check blood sugars daily FOR ICD-9 250.00 (Patient taking differently: 1 each by Other route See admin instructions. Dispense based on  patient and insurance preference. To check blood sugars daily FOR ICD-9 250.00)   cholestyramine (QUESTRAN) 4 g packet DISSOLVE AND TAKE 1 PACKET OF POWDER BY MOUTH TWICE DAILY   dicyclomine (BENTYL) 20 MG tablet Take 20 mg by mouth daily as needed for spasms.   empagliflozin (JARDIANCE) 25 MG TABS tablet Take 1 tablet (25 mg total) by mouth daily.   fenofibrate micronized (LOFIBRA) 134 MG capsule Take 134 mg by mouth daily before breakfast.   FLUCELVAX QUADRIVALENT 0.5 ML injection    Glucose Blood (BLOOD GLUCOSE TEST STRIPS) STRP 1 each by Other route See admin instructions. Use to test blood sugar up to twice a day   glucose blood (FREESTYLE TEST STRIPS) test strip Use as directed to test blood sugar up to twice a day. Dx code E11.9   glucose blood (TRUE METRIX BLOOD GLUCOSE TEST) test strip Use as instructed to check glucose up to 2 times daily. Dx Code E11.9   levothyroxine (SYNTHROID) 150 MCG tablet TAKE 1 TABLET EVERY DAY BEFORE BREAKFAST   metaxalone (SKELAXIN) 800 MG tablet Take 1 tablet (800 mg total) by mouth 3 (three) times daily as needed for muscle spasms.   metFORMIN (GLUCOPHAGE) 1000 MG tablet TAKE 1 TABLET TWICE DAILY   norethindrone (AYGESTIN) 5 MG tablet Take 1 tablet (5 mg total) by mouth in the morning and at bedtime.   nystatin (MYCOSTATIN/NYSTOP) powder Apply 1 application. topically  3 (three) times daily.   ONE TOUCH ULTRA TEST test strip USE TO CHECK BLOOD SUGAR ONCE DAILY (ICD CODE:25.00) (Patient taking differently: 1 each by Other route daily.)   tirzepatide (MOUNJARO) 7.5 MG/0.5ML Pen 7.35m   TOUJEO SOLOSTAR 300 UNIT/ML Solostar Pen Inject into the skin.   TRUEplus Lancets 30G MISC Use as directed to check glucose up to two times daily. Dx code E11.9   [DISCONTINUED] fenofibrate (TRICOR) 48 MG tablet Take 48 mg by mouth daily.   [DISCONTINUED] amoxicillin-clavulanate (AUGMENTIN) 875-125 MG tablet Take 1 tablet by mouth 2 (two) times daily.   [DISCONTINUED]  levonorgestrel-ethinyl estradiol (JOLESSA) 0.15-0.03 MG tablet Take 1 tablet by mouth once daily (Patient not taking: Reported on 01/09/2022)   [DISCONTINUED] methocarbamol (ROBAXIN) 500 MG tablet Take 1 tablet (500 mg total) by mouth every 6 (six) hours as needed for muscle spasms.   No facility-administered encounter medications on file as of 01/09/2022.    Allergies (verified) Vancomycin, Cefuroxime axetil, and Sulfa antibiotics   History: Past Medical History:  Diagnosis Date   Diabetes mellitus    type II   Down syndrome    Family history of adverse reaction to anesthesia    mom has had n/v   Gastritis    Headache    Hepatic steatosis    Hidradenitis    Hypothyroidism    Irritable bowel syndrome    Periumbilical hernia    Pneumonia 03/2020   frequent    Restless legs    Tendonitis    chronic in left foot   Thyroid disease    hypothyroidism   Past Surgical History:  Procedure Laterality Date   AXILLARY HIDRADENITIS EXCISION Bilateral    CHOLECYSTECTOMY     HERNIA REPAIR     multiple   INCISIONAL HERNIA REPAIR  05/30/2015   LAPROSCOPIC   INCISIONAL HERNIA REPAIR N/A 05/30/2015   Procedure: LAPAROSCOPIC INCISIONAL HERNIA;  Surgeon: MRolm Bookbinder MD;  [place] [facility]  Service: General;  Laterality: N/A;   INSERTION OF MESH N/A 05/30/2015   Procedure: INSERTION OF MESH;  Surgeon: MRolm Bookbinder MD;  [place] [facility]  Service: General;  Laterality: N/A;   KNEE ARTHROSCOPY     right knee   LAPAROSCOPY N/A 05/30/2015   Procedure: LAPAROSCOPY DIAGNOSTIC;  Surgeon: MRolm Bookbinder MD;  [place] [facility]  Service: General;  Laterality: N/A;   PARTIAL KNEE ARTHROPLASTY Right 10/30/2017   Procedure: UNICOMPARTMENTAL RIGHT KNEE LATERAL;  Surgeon: OParalee Cancel MD;  Location: [facility];  Service: Orthopedics;  Laterality: Right;  90 mins   PARTIAL KNEE ARTHROPLASTY Left 06/29/2020   Procedure: UNICOMPARTMENTAL KNEE LATERALLY;  Surgeon: OParalee Cancel MD;  Location: [facility];   Service: Orthopedics;  Laterality: Left;  773MINS   TONSILLECTOMY     adenoids   Family History  Problem Relation Age of Onset   Thyroid cancer Mother    Diabetes type II Mother    High Cholesterol Mother    Heart disease Mother    Diabetes type II Father    Hypertension Father    Stroke Father    Social History   Socioeconomic History   Marital status: Single    Spouse name: Not on file   Number of children: Not on file   Years of education: Not on file   Highest education level: Not on file  Occupational History   Occupation: disabled  Tobacco Use   Smoking status: Never   Smokeless tobacco: Never  Vaping Use   Vaping Use:  Never used  Substance and Sexual Activity   Alcohol use: No    Alcohol/week: 0.0 standard drinks of alcohol   Drug use: No   Sexual activity: Yes    Birth control/protection: Pill  Other Topics Concern   Not on file  Social History Narrative   Not on file   Social Determinants of Health   Financial Resource Strain: Low Risk  (01/09/2022)   Overall Financial Resource Strain (CARDIA)    Difficulty of Paying Living Expenses: Not hard at all  Food Insecurity: No Food Insecurity (01/09/2022)   Hunger Vital Sign    Worried About Running Out of Food in the Last Year: Never true    Ran Out of Food in the Last Year: Never true  Transportation Needs: No Transportation Needs (01/09/2022)   PRAPARE - Hydrologist (Medical): No    Lack of Transportation (Non-Medical): No  Physical Activity: Insufficiently Active (01/09/2022)   Exercise Vital Sign    Days of Exercise per Week: 5 days    Minutes of Exercise per Session: 10 min  Stress: No Stress Concern Present (01/09/2022)   [place]    Feeling of Stress : Only a little  Social Connections: Moderately Integrated (01/09/2022)   Social Connection and Isolation Panel [NHANES]    Frequency of Communication with  Friends and Family: More than three times a week    Frequency of Social Gatherings with Friends and Family: More than three times a week    Attends Religious Services: More than 4 times per year    Active Member of [employer] or Organizations: Yes    Attends Archivist Meetings: 1 to 4 times per year    Marital Status: Never married    Tobacco Counseling Counseling given: Not Answered   Clinical Intake:  Pre-visit preparation completed: Yes  Pain : No/denies pain     BMI - recorded: 39.82 Nutritional Status: BMI > 30  Obese Nutritional Risks: None Diabetes: Yes CBG done?: Yes (per pt before meal 171) CBG resulted in Enter/ Edit results?: No Did pt. bring in CBG monitor from home?: No  How often do you need to have someone help you when you read instructions, pamphlets, or other written materials from your doctor or pharmacy?: 1 - Never  Diabetic?Nutrition Risk Assessment:  Has the patient had any N/V/D within the last 2 months?  No  Does the patient have any non-healing wounds?  No  Has the patient had any unintentional weight loss or weight gain?  No   Diabetes:  Is the patient diabetic?  Yes  If diabetic, was a CBG obtained today?  Yes  Did the patient bring in their glucometer from home?  No  How often do you monitor your CBG's? daily.   Financial Strains and Diabetes Management:  Are you having any financial strains with the device, your supplies or your medication? No .  Does the patient want to be seen by Chronic Care Management for management of their diabetes?  No  Would the patient like to be referred to a Nutritionist or for Diabetic Management?  No   Diabetic Exams:  Diabetic Eye Exam: Completed per pt in September  Diabetic Foot Exam: Overdue, Pt has been advised about the importance in completing this exam. Pt is scheduled for diabetic foot exam on next appt .   Interpreter Needed?: No  Information entered by :: Charlott Rakes,  LPN  Activities of Daily Living    01/09/2022    3:11 PM  In your present state of health, do you have any difficulty performing the following activities:  Hearing? 0  Vision? 0  Difficulty concentrating or making decisions? 0  Walking or climbing stairs? 1  Comment it's difficult avoid  Dressing or bathing? 0  Doing errands, shopping? 0  Preparing Food and eating ? N  Using the Toilet? N  In the past six months, have you accidently leaked urine? N  Do you have problems with loss of bowel control? N  Managing your Medications? N  Managing your Finances? N  Housekeeping or managing your Housekeeping? N    Patient Care Team: Copland, Gay Filler, MD as PCP - General (Family Medicine) Silverio Decamp, MD as Consulting Physician (Sports Medicine)  Indicate any recent Medical Services you may have received from other than [facility] providers in the past year (date may be approximate).     Assessment:   This is a routine wellness examination for [place].  Hearing/Vision screen Hearing Screening - Comments:: Pt denies any hearing issues  Vision Screening - Comments:: Pt follows up with [facility] for annul eye exams   Dietary issues and exercise activities discussed: Current Exercise Habits: Home exercise routine, Type of exercise: walking, Time (Minutes): 10, Frequency (Times/Week): 5, Weekly Exercise (Minutes/Week): 50   Goals Addressed             This Visit's Progress    Patient Stated       Patient Stated       Keep walking        Depression Screen    01/09/2022    3:06 PM 08/03/2021   11:41 AM 05/24/2021   11:18 AM 10/25/2020   11:52 AM 09/01/2019    2:59 PM 02/27/2017    2:40 PM 02/06/2015    7:16 PM  PHQ 2/9 Scores  PHQ - 2 Score 0 0 0  0 0 0  Exception Documentation    Other- indicate reason in comment box       Fall Risk    01/09/2022    3:10 PM 05/24/2021   11:18 AM 10/25/2020   11:50 AM 02/27/2017    2:40 PM 02/06/2015    7:16 PM  [place] in  the past year? 1 0 1 Yes Yes  Number falls in past yr: 1 0 _0 or more  Injury with Fall? 1 0 0 Yes No  Comment leg injury      Risk for fall due to : Impaired vision;Impaired balance/gait;Impaired mobility  History of fall(s)    Follow up Falls prevention discussed  Falls prevention discussed Falls evaluation completed     FALL RISK PREVENTION PERTAINING TO THE HOME:  Any stairs in or around the home? No  If so, are there any without handrails? No  Home free of loose throw rugs in walkways, pet beds, electrical cords, etc? Yes  Adequate lighting in your home to reduce risk of falls? Yes   ASSISTIVE DEVICES UTILIZED TO PREVENT FALLS:  Life alert? No  Use of a cane, walker or w/c? Yes  Grab bars in the bathroom? Yes  Shower chair or bench in shower? Yes  Elevated toilet seat or a handicapped toilet? No   TIMED UP AND GO:  Was the test performed? No .   Cognitive Function:        01/09/2022    3:12 PM  6CIT  Screen  What Year? 0 points  What month? 0 points  What time? 0 points  Count back from 20 0 points  Months in reverse 4 points  Repeat phrase 10 points  Total Score 14 points    Immunizations Immunization History  Administered Date(s) Administered   Influenza Inj Mdck Quad Pf 11/29/2016, 10/29/2017   Influenza Split 11/05/2011   Influenza, Seasonal, Injecte, Preservative Fre 11/16/2012, 10/25/2013, 12/07/2014, 01/06/2015, 11/15/2015   Influenza,inj,Quad PF,6+ Mos 11/15/2015, 09/26/2018   Influenza-Unspecified 11/05/2011, 11/15/2015, 11/05/2019, 11/23/2021   Moderna Sars-Covid-2 Vaccination 04/01/2019, 05/04/2019, 10/06/2019   Tdap 03/05/2017    TDAP status: Up to date  Flu Vaccine status: Up to date    Covid-19 vaccine status: Completed vaccines  Qualifies for Shingles Vaccine? No   Screening Tests Health Maintenance  Topic Date Due   Hepatitis C Screening  Never done   OPHTHALMOLOGY EXAM  06/05/2019   HEMOGLOBIN A1C  06/19/2021   COVID-19  Vaccine (4 - 2023-24 season) 10/05/2021   Diabetic kidney evaluation - GFR measurement  12/20/2021   FOOT EXAM  12/20/2021   Diabetic kidney evaluation - Urine ACR  05/25/2022   Medicare Annual Wellness (AWV)  01/10/2023   PAP SMEAR-Modifier  05/30/2024   DTaP/Tdap/Td (2 - Td or Tdap) 03/06/2027   INFLUENZA VACCINE  Completed   HIV Screening  Completed   HPV VACCINES  Aged Out    Health Maintenance  Health Maintenance Due  Topic Date Due   Hepatitis C Screening  Never done   OPHTHALMOLOGY EXAM  06/05/2019   HEMOGLOBIN A1C  06/19/2021   COVID-19 Vaccine (4 - 2023-24 season) 10/05/2021   Diabetic kidney evaluation - GFR measurement  12/20/2021   FOOT EXAM  12/20/2021   Additional Screening:  Hepatitis C Screening: does qualify;   Vision Screening: Recommended annual ophthalmology exams for early detection of glaucoma and other disorders of the eye. Is the patient up to date with their annual eye exam?  Yes  Who is the provider or what is the name of the office in which the patient attends annual eye exams? [facility]  If pt is not established with a provider, would they like to be referred to a provider to establish care? No .   Dental Screening: Recommended annual dental exams for proper oral hygiene  Community Resource Referral / Chronic Care Management: CRR required this visit?  No   CCM required this visit?  No      Plan:     I have personally reviewed and noted the following in the patient's chart:   Medical and social history Use of alcohol, tobacco or illicit drugs  Current medications and supplements including opioid prescriptions. Patient is not currently taking opioid prescriptions. Functional ability and status Nutritional status Physical activity Advanced directives List of other physicians Hospitalizations, surgeries, and ER visits in previous 12 months Vitals Screenings to include cognitive, depression, and falls Referrals and appointments  In  addition, I have reviewed and discussed with patient certain preventive protocols, quality metrics, and best practice recommendations. A written personalized care plan for preventive services as well as general preventive health recommendations were provided to patient.     Willette Brace, LPN   17/06/1023   Nurse Notes: pt was unable to recite moths in reverse and was unable to recall address stated. Pt was very knowledgeable with all questions asked

## 2022-01-09 NOTE — Patient Instructions (Signed)
Wanda Oneill , Thank you for taking time to come for your Medicare Wellness Visit. I appreciate your ongoing commitment to your health goals. Please review the following plan we discussed and let me know if I can assist you in the future.   These are the goals we discussed:  Goals   None     This is a list of the screening recommended for you and due dates:  Health Maintenance  Topic Date Due   Hepatitis C Screening: USPSTF Recommendation to screen - Ages 27-79 yo.  Never done   Eye exam for diabetics  06/05/2019   Hemoglobin A1C  06/19/2021   Flu Shot  09/04/2021   COVID-19 Vaccine (4 - 2023-24 season) 10/05/2021   Medicare Annual Wellness Visit  10/25/2021   Yearly kidney function blood test for diabetes  12/20/2021   Complete foot exam   12/20/2021   Yearly kidney health urinalysis for diabetes  05/25/2022   Pap Smear  05/30/2024   DTaP/Tdap/Td vaccine (2 - Td or Tdap) 03/06/2027   HIV Screening  Completed   HPV Vaccine  Aged Out    Advanced directives: Advance directive discussed with you today. Even though you declined this today please call our office should you change your mind and we can give you the proper paperwork for you to fill out.  Conditions/risks identified: to keep walking   Next appointment: Follow up in one year for your annual wellness visit.   Preventive Care 40-64 Years, Female Preventive care refers to lifestyle choices and visits with your health care provider that can promote health and wellness. What does preventive care include? A yearly physical exam. This is also called an annual well check. Dental exams once or twice a year. Routine eye exams. Ask your health care provider how often you should have your eyes checked. Personal lifestyle choices, including: Daily care of your teeth and gums. Regular physical activity. Eating a healthy diet. Avoiding tobacco and drug use. Limiting alcohol use. Practicing safe sex. Taking low-dose aspirin daily  starting at age 55. Taking vitamin and mineral supplements as recommended by your health care provider. What happens during an annual well check? The services and screenings done by your health care provider during your annual well check will depend on your age, overall health, lifestyle risk factors, and family history of disease. Counseling  Your health care provider may ask you questions about your: Alcohol use. Tobacco use. Drug use. Emotional well-being. Home and relationship well-being. Sexual activity. Eating habits. Work and work Statistician. Method of birth control. Menstrual cycle. Pregnancy history. Screening  You may have the following tests or measurements: Height, weight, and BMI. Blood pressure. Lipid and cholesterol levels. These may be checked every 5 years, or more frequently if you are over 19 years old. Skin check. Lung cancer screening. You may have this screening every year starting at age 8 if you have a 30-pack-year history of smoking and currently smoke or have quit within the past 15 years. Fecal occult blood test (FOBT) of the stool. You may have this test every year starting at age 26. Flexible sigmoidoscopy or colonoscopy. You may have a sigmoidoscopy every 5 years or a colonoscopy every 10 years starting at age 24. Hepatitis C blood test. Hepatitis B blood test. Sexually transmitted disease (STD) testing. Diabetes screening. This is done by checking your blood sugar (glucose) after you have not eaten for a while (fasting). You may have this done every 1-3 years. Mammogram. This may  be done every 1-2 years. Talk to your health care provider about when you should start having regular mammograms. This may depend on whether you have a family history of breast cancer. BRCA-related cancer screening. This may be done if you have a family history of breast, ovarian, tubal, or peritoneal cancers. Pelvic exam and Pap test. This may be done every 3 years starting  at age 21. Starting at age 30, this may be done every 5 years if you have a Pap test in combination with an HPV test. Bone density scan. This is done to screen for osteoporosis. You may have this scan if you are at high risk for osteoporosis. Discuss your test results, treatment options, and if necessary, the need for more tests with your health care provider. Vaccines  Your health care provider may recommend certain vaccines, such as: Influenza vaccine. This is recommended every year. Tetanus, diphtheria, and acellular pertussis (Tdap, Td) vaccine. You may need a Td booster every 10 years. Zoster vaccine. You may need this after age 60. Pneumococcal 13-valent conjugate (PCV13) vaccine. You may need this if you have certain conditions and were not previously vaccinated. Pneumococcal polysaccharide (PPSV23) vaccine. You may need one or two doses if you smoke cigarettes or if you have certain conditions. Talk to your health care provider about which screenings and vaccines you need and how often you need them. This information is not intended to replace advice given to you by your health care provider. Make sure you discuss any questions you have with your health care provider. Document Released: 02/17/2015 Document Revised: 10/11/2015 Document Reviewed: 11/22/2014 Elsevier Interactive Patient Education  2017 Elsevier Inc.    Fall Prevention in the Home Falls can cause injuries. They can happen to people of all ages. There are many things you can do to make your home safe and to help prevent falls. What can I do on the outside of my home? Regularly fix the edges of walkways and driveways and fix any cracks. Remove anything that might make you trip as you walk through a door, such as a raised step or threshold. Trim any bushes or trees on the path to your home. Use bright outdoor lighting. Clear any walking paths of anything that might make someone trip, such as rocks or tools. Regularly check  to see if handrails are loose or broken. Make sure that both sides of any steps have handrails. Any raised decks and porches should have guardrails on the edges. Have any leaves, snow, or ice cleared regularly. Use sand or salt on walking paths during winter. Clean up any spills in your garage right away. This includes oil or grease spills. What can I do in the bathroom? Use night lights. Install grab bars by the toilet and in the tub and shower. Do not use towel bars as grab bars. Use non-skid mats or decals in the tub or shower. If you need to sit down in the shower, use a plastic, non-slip stool. Keep the floor dry. Clean up any water that spills on the floor as soon as it happens. Remove soap buildup in the tub or shower regularly. Attach bath mats securely with double-sided non-slip rug tape. Do not have throw rugs and other things on the floor that can make you trip. What can I do in the bedroom? Use night lights. Make sure that you have a light by your bed that is easy to reach. Do not use any sheets or blankets that are too big   for your bed. They should not hang down onto the floor. Have a firm chair that has side arms. You can use this for support while you get dressed. Do not have throw rugs and other things on the floor that can make you trip. What can I do in the kitchen? Clean up any spills right away. Avoid walking on wet floors. Keep items that you use a lot in easy-to-reach places. If you need to reach something above you, use a strong step stool that has a grab bar. Keep electrical cords out of the way. Do not use floor polish or wax that makes floors slippery. If you must use wax, use non-skid floor wax. Do not have throw rugs and other things on the floor that can make you trip. What can I do with my stairs? Do not leave any items on the stairs. Make sure that there are handrails on both sides of the stairs and use them. Fix handrails that are broken or loose. Make  sure that handrails are as long as the stairways. Check any carpeting to make sure that it is firmly attached to the stairs. Fix any carpet that is loose or worn. Avoid having throw rugs at the top or bottom of the stairs. If you do have throw rugs, attach them to the floor with carpet tape. Make sure that you have a light switch at the top of the stairs and the bottom of the stairs. If you do not have them, ask someone to add them for you. What else can I do to help prevent falls? Wear shoes that: Do not have high heels. Have rubber bottoms. Are comfortable and fit you well. Are closed at the toe. Do not wear sandals. If you use a stepladder: Make sure that it is fully opened. Do not climb a closed stepladder. Make sure that both sides of the stepladder are locked into place. Ask someone to hold it for you, if possible. Clearly mark and make sure that you can see: Any grab bars or handrails. First and last steps. Where the edge of each step is. Use tools that help you move around (mobility aids) if they are needed. These include: Canes. Walkers. Scooters. Crutches. Turn on the lights when you go into a dark area. Replace any light bulbs as soon as they burn out. Set up your furniture so you have a clear path. Avoid moving your furniture around. If any of your floors are uneven, fix them. If there are any pets around you, be aware of where they are. Review your medicines with your doctor. Some medicines can make you feel dizzy. This can increase your chance of falling. Ask your doctor what other things that you can do to help prevent falls. This information is not intended to replace advice given to you by your health care provider. Make sure you discuss any questions you have with your health care provider. Document Released: 11/17/2008 Document Revised: 06/29/2015 Document Reviewed: 02/25/2014 Elsevier Interactive Patient Education  2017 Elsevier Inc.  

## 2022-01-15 ENCOUNTER — Ambulatory Visit (INDEPENDENT_AMBULATORY_CARE_PROVIDER_SITE_OTHER): Payer: Medicare HMO | Admitting: Sports Medicine

## 2022-01-15 DIAGNOSIS — G8929 Other chronic pain: Secondary | ICD-10-CM | POA: Diagnosis not present

## 2022-01-15 DIAGNOSIS — M545 Low back pain, unspecified: Secondary | ICD-10-CM

## 2022-01-15 MED ORDER — TRIAZOLAM 0.25 MG PO TABS: ORAL_TABLET | ORAL | 0 refills | Status: DC

## 2022-01-15 NOTE — Progress Notes (Addendum)
    Procedures performed today:    None.  Independent interpretation of notes and tests performed by another provider:   None.  Brief History, Exam, Impression, and Recommendations:    Chronic right-sided low back pain This is a very pleasant 41 year old female with chronic axial right-sided low back pain worse with standing, she had a CT done for another reason several years ago that showed widespread spondylosis including right SI joint osteoarthritis, multilevel lower lumbar facet arthritis and upper lumbar/lower thoracic degenerative disc disease. At the last visit pain was referable to the sacroiliac joint, this was injected with ultrasound guidance, unfortunately she did not get long-lasting relief, at this point we do need to target her lumbar facet joints but first we would need a lumbar spine MRI. I will go ahead and order the injections once I see the lumbar spine MRI. Adding triazolam for preprocedural anxiolysis.  Update: MRI does confirm severe lower lumbar facet arthritis, we are going to target the L3-S1 facets on the right.  Expect a phone call for scheduling from [place].    ____________________________________________ Gwen Her. Dianah Field, M.D., ABFM., CAQSM., AME. Primary Care and Sports Medicine [facility]  Adjunct Professor of [place] [employer]  Risk manager

## 2022-01-15 NOTE — Assessment & Plan Note (Addendum)
This is a very pleasant 41 year old female with chronic axial right-sided low back pain worse with standing, she had a CT done for another reason several years ago that showed widespread spondylosis including right SI joint osteoarthritis, multilevel lower lumbar facet arthritis and upper lumbar/lower thoracic degenerative disc disease. At the last visit pain was referable to the sacroiliac joint, this was injected with ultrasound guidance, unfortunately she did not get long-lasting relief, at this point we do need to target her lumbar facet joints but first we would need a lumbar spine MRI. I will go ahead and order the injections once I see the lumbar spine MRI. Adding triazolam for preprocedural anxiolysis.  Update: MRI does confirm severe lower lumbar facet arthritis, we are going to target the L3-S1 facets on the right.  Expect a phone call for scheduling from [place].

## 2022-01-23 ENCOUNTER — Ambulatory Visit: Payer: Medicare HMO | Admitting: Family Medicine

## 2022-02-02 ENCOUNTER — Other Ambulatory Visit: Payer: Medicare HMO

## 2022-02-04 DIAGNOSIS — R32 Unspecified urinary incontinence: Secondary | ICD-10-CM

## 2022-02-04 HISTORY — DX: Unspecified urinary incontinence: R32

## 2022-02-05 ENCOUNTER — Other Ambulatory Visit: Payer: Self-pay

## 2022-02-05 ENCOUNTER — Emergency Department (HOSPITAL_BASED_OUTPATIENT_CLINIC_OR_DEPARTMENT_OTHER): Payer: Medicare HMO

## 2022-02-05 ENCOUNTER — Ambulatory Visit
Admission: EM | Admit: 2022-02-05 | Discharge: 2022-02-05 | Disposition: A | Discharge status: Home / Self Care | Payer: Medicare HMO | Attending: Family Medicine | Admitting: Family Medicine

## 2022-02-05 ENCOUNTER — Encounter (HOSPITAL_BASED_OUTPATIENT_CLINIC_OR_DEPARTMENT_OTHER): Payer: Self-pay

## 2022-02-05 ENCOUNTER — Inpatient Hospital Stay (HOSPITAL_BASED_OUTPATIENT_CLINIC_OR_DEPARTMENT_OTHER)
Admission: EM | Admit: 2022-02-05 | Discharge: 2022-02-09 | DRG: 513 | Disposition: A | Discharge status: Home Health | Payer: Medicare HMO | Attending: Internal Medicine | Admitting: Internal Medicine

## 2022-02-05 ENCOUNTER — Encounter (HOSPITAL_COMMUNITY): Payer: Self-pay

## 2022-02-05 DIAGNOSIS — M6510 Other infective (teno)synovitis, unspecified site: Secondary | ICD-10-CM | POA: Diagnosis present

## 2022-02-05 DIAGNOSIS — Z794 Long term (current) use of insulin: Secondary | ICD-10-CM | POA: Diagnosis not present

## 2022-02-05 DIAGNOSIS — E781 Pure hyperglyceridemia: Secondary | ICD-10-CM | POA: Diagnosis present

## 2022-02-05 DIAGNOSIS — Z79890 Hormone replacement therapy (postmenopausal): Secondary | ICD-10-CM

## 2022-02-05 DIAGNOSIS — L02419 Cutaneous abscess of limb, unspecified: Secondary | ICD-10-CM | POA: Diagnosis not present

## 2022-02-05 DIAGNOSIS — J189 Pneumonia, unspecified organism: Secondary | ICD-10-CM | POA: Diagnosis not present

## 2022-02-05 DIAGNOSIS — E1165 Type 2 diabetes mellitus with hyperglycemia: Secondary | ICD-10-CM | POA: Diagnosis present

## 2022-02-05 DIAGNOSIS — Z7984 Long term (current) use of oral hypoglycemic drugs: Secondary | ICD-10-CM

## 2022-02-05 DIAGNOSIS — M7989 Other specified soft tissue disorders: Secondary | ICD-10-CM | POA: Diagnosis not present

## 2022-02-05 DIAGNOSIS — E1169 Type 2 diabetes mellitus with other specified complication: Secondary | ICD-10-CM | POA: Diagnosis present

## 2022-02-05 DIAGNOSIS — Q909 Down syndrome, unspecified: Secondary | ICD-10-CM | POA: Diagnosis not present

## 2022-02-05 DIAGNOSIS — Z8616 Personal history of COVID-19: Secondary | ICD-10-CM | POA: Diagnosis not present

## 2022-02-05 DIAGNOSIS — M659 Synovitis and tenosynovitis, unspecified: Secondary | ICD-10-CM | POA: Diagnosis not present

## 2022-02-05 DIAGNOSIS — Z833 Family history of diabetes mellitus: Secondary | ICD-10-CM | POA: Diagnosis not present

## 2022-02-05 DIAGNOSIS — Z808 Family history of malignant neoplasm of other organs or systems: Secondary | ICD-10-CM

## 2022-02-05 DIAGNOSIS — E039 Hypothyroidism, unspecified: Secondary | ICD-10-CM | POA: Diagnosis present

## 2022-02-05 DIAGNOSIS — L02511 Cutaneous abscess of right hand: Secondary | ICD-10-CM | POA: Diagnosis not present

## 2022-02-05 DIAGNOSIS — Z96653 Presence of artificial knee joint, bilateral: Secondary | ICD-10-CM | POA: Diagnosis present

## 2022-02-05 DIAGNOSIS — L0292 Furuncle, unspecified: Secondary | ICD-10-CM | POA: Diagnosis present

## 2022-02-05 DIAGNOSIS — M8618 Other acute osteomyelitis, other site: Secondary | ICD-10-CM | POA: Diagnosis not present

## 2022-02-05 DIAGNOSIS — E119 Type 2 diabetes mellitus without complications: Secondary | ICD-10-CM | POA: Diagnosis not present

## 2022-02-05 DIAGNOSIS — Z888 Allergy status to other drugs, medicaments and biological substances status: Secondary | ICD-10-CM

## 2022-02-05 DIAGNOSIS — D72829 Elevated white blood cell count, unspecified: Secondary | ICD-10-CM | POA: Diagnosis not present

## 2022-02-05 DIAGNOSIS — K589 Irritable bowel syndrome without diarrhea: Secondary | ICD-10-CM | POA: Diagnosis present

## 2022-02-05 DIAGNOSIS — L03119 Cellulitis of unspecified part of limb: Secondary | ICD-10-CM

## 2022-02-05 DIAGNOSIS — L03012 Cellulitis of left finger: Secondary | ICD-10-CM | POA: Diagnosis present

## 2022-02-05 DIAGNOSIS — B951 Streptococcus, group B, as the cause of diseases classified elsewhere: Secondary | ICD-10-CM | POA: Diagnosis present

## 2022-02-05 DIAGNOSIS — S62632A Displaced fracture of distal phalanx of right middle finger, initial encounter for closed fracture: Secondary | ICD-10-CM | POA: Diagnosis not present

## 2022-02-05 DIAGNOSIS — M65841 Other synovitis and tenosynovitis, right hand: Principal | ICD-10-CM | POA: Diagnosis present

## 2022-02-05 DIAGNOSIS — Z6841 Body Mass Index (BMI) 40.0 and over, adult: Secondary | ICD-10-CM

## 2022-02-05 DIAGNOSIS — M869 Osteomyelitis, unspecified: Secondary | ICD-10-CM | POA: Diagnosis present

## 2022-02-05 DIAGNOSIS — M868X8 Other osteomyelitis, other site: Secondary | ICD-10-CM | POA: Diagnosis present

## 2022-02-05 DIAGNOSIS — B9561 Methicillin susceptible Staphylococcus aureus infection as the cause of diseases classified elsewhere: Secondary | ICD-10-CM | POA: Diagnosis present

## 2022-02-05 DIAGNOSIS — Z79899 Other long term (current) drug therapy: Secondary | ICD-10-CM

## 2022-02-05 DIAGNOSIS — L03011 Cellulitis of right finger: Secondary | ICD-10-CM | POA: Diagnosis present

## 2022-02-05 DIAGNOSIS — Z83438 Family history of other disorder of lipoprotein metabolism and other lipidemia: Secondary | ICD-10-CM | POA: Diagnosis not present

## 2022-02-05 DIAGNOSIS — Z823 Family history of stroke: Secondary | ICD-10-CM

## 2022-02-05 DIAGNOSIS — Z882 Allergy status to sulfonamides status: Secondary | ICD-10-CM | POA: Diagnosis not present

## 2022-02-05 DIAGNOSIS — M79644 Pain in right finger(s): Secondary | ICD-10-CM | POA: Diagnosis not present

## 2022-02-05 DIAGNOSIS — Z881 Allergy status to other antibiotic agents status: Secondary | ICD-10-CM

## 2022-02-05 DIAGNOSIS — L089 Local infection of the skin and subcutaneous tissue, unspecified: Secondary | ICD-10-CM | POA: Diagnosis not present

## 2022-02-05 DIAGNOSIS — Z8249 Family history of ischemic heart disease and other diseases of the circulatory system: Secondary | ICD-10-CM

## 2022-02-05 LAB — COMPREHENSIVE METABOLIC PANEL
ALT: 34 U/L (ref 0–44)
AST: 27 U/L (ref 15–41)
Albumin: 3.1 g/dL — ABNORMAL LOW (ref 3.5–5.0)
Alkaline Phosphatase: 35 U/L — ABNORMAL LOW (ref 38–126)
Anion gap: 12 (ref 5–15)
BUN: 12 mg/dL (ref 6–20)
CO2: 21 mmol/L — ABNORMAL LOW (ref 22–32)
Calcium: 8.5 mg/dL — ABNORMAL LOW (ref 8.9–10.3)
Chloride: 101 mmol/L (ref 98–111)
Creatinine, Ser: 0.77 mg/dL (ref 0.44–1.00)
GFR, Estimated: >60 mL/min (ref >60)
Glucose, Bld: 289 mg/dL — ABNORMAL HIGH (ref 70–99)
Potassium: 3.8 mmol/L (ref 3.5–5.1)
Sodium: 134 mmol/L — ABNORMAL LOW (ref 135–145)
Total Bilirubin: 0.4 mg/dL (ref 0.3–1.2)
Total Protein: 8 g/dL (ref 6.5–8.1)

## 2022-02-05 LAB — CBC WITH DIFFERENTIAL/PLATELET
Abs Immature Granulocytes: 0.33 10*3/uL — ABNORMAL HIGH (ref 0.00–0.07)
Basophils Absolute: 0.1 10*3/uL (ref 0.0–0.1)
Basophils Relative: 1 %
Eosinophils Absolute: 0.1 10*3/uL (ref 0.0–0.5)
Eosinophils Relative: 0 %
HCT: 41.4 % (ref 36.0–46.0)
Hemoglobin: 13.1 g/dL (ref 12.0–15.0)
Immature Granulocytes: 2 %
Lymphocytes Relative: 15 %
Lymphs Abs: 2 10*3/uL (ref 0.7–4.0)
MCH: 26.5 pg (ref 26.0–34.0)
MCHC: 31.6 g/dL (ref 30.0–36.0)
MCV: 83.8 fL (ref 80.0–100.0)
Monocytes Absolute: 1 10*3/uL (ref 0.1–1.0)
Monocytes Relative: 8 %
Neutro Abs: 10 10*3/uL — ABNORMAL HIGH (ref 1.7–7.7)
Neutrophils Relative %: 74 %
Platelets: 403 10*3/uL — ABNORMAL HIGH (ref 150–400)
RBC: 4.94 MIL/uL (ref 3.87–5.11)
RDW: 16.3 % — ABNORMAL HIGH (ref 11.5–15.5)
WBC: 13.5 10*3/uL — ABNORMAL HIGH (ref 4.0–10.5)
nRBC: 0.1 % (ref 0.0–0.2)

## 2022-02-05 LAB — CBG MONITORING, ED: Glucose-Capillary: 281 mg/dL — ABNORMAL HIGH (ref 70–99)

## 2022-02-05 MED ORDER — SODIUM CHLORIDE 0.9 % IV SOLN
100.0000 mg | Freq: Once | INTRAVENOUS | Status: AC
  Administered 2022-02-06: 100 mg via INTRAVENOUS
  Filled 2022-02-05: qty 100

## 2022-02-05 MED ORDER — PIPERACILLIN-TAZOBACTAM 3.375 G IVPB 30 MIN
3.3750 g | Freq: Once | INTRAVENOUS | Status: AC
  Administered 2022-02-05: 3.375 g via INTRAVENOUS
  Filled 2022-02-05: qty 50

## 2022-02-05 NOTE — ED Notes (Signed)
Patient is being discharged from the Urgent Care and sent to the Emergency Department via POV . Per Marlan Palau, MD, patient is in need of higher level of care due to finger infection. Patient is aware and verbalizes understanding of plan of care.  Vitals:   02/05/22 1602  BP: 114/75  Pulse: 99  Resp: (!) 24  Temp: 99.1 F (37.3 C)  SpO2: 98%

## 2022-02-05 NOTE — ED Triage Notes (Signed)
Pt c/o ?infection to right middle finger x 3-4 days-denies known injury-mother with pt-pt NAD-steady gait with own cane

## 2022-02-05 NOTE — ED Provider Notes (Signed)
[facility]    CSN: [id number] Arrival date & time: 02/05/22  1456      History   Chief Complaint Chief Complaint  Patient presents with   Hand Pain    HPI Wanda Oneill is a 42 y.o. female.    Hand Pain   Here for swelling and pain and redness in her right middle finger.  It apparently has been bothering her a few days, but she just let her parents know that this was bothering her today.  No fever or chills.    Past Medical History:  Diagnosis Date   Diabetes mellitus    type II   Down syndrome    Family history of adverse reaction to anesthesia    mom has had n/v   Gastritis    Headache    Hepatic steatosis    Hidradenitis    Hypothyroidism    Irritable bowel syndrome    Periumbilical hernia    Pneumonia 03/2020   frequent    Restless legs    Tendonitis    chronic in left foot   Thyroid disease    hypothyroidism    Patient Active Problem List   Diagnosis Date Noted   Chronic right-sided low back pain 11/02/2021   Bilateral trigger thumb 06/18/2021   Chronic paronychia of finger 01/25/2021   S/P left unicompartmental knee replacement, lateral 06/29/2020   At risk for obstructive sleep apnea 06/11/2019   Cough 06/11/2019   Hyperglycemia 05/26/2019   Acute hypoxemic respiratory failure ([place]) 05/20/2019   Fibromyalgia 03/09/2019   Neck pain, chronic 03/09/2019   Acute shoulder bursitis, right 02/03/2019   Bile salt-induced diarrhea 09/25/2018   Arthritis of left acromioclavicular joint 09/23/2018   Precordial chest pain 08/11/2018   Morbid obesity ([place]) 10/31/2017   S/P right UKR, lateral 10/30/2017   Incarcerated epigastric hernia 05/30/2015   Left foot pain 08/09/2014   Chronic diarrhea 07/27/2014   Gout 08/10/2012   Acid reflux 01/22/2012   Down syndrome 11/11/2011   Hernia of abdominal wall 10/21/2011   Psoriasis 10/21/2011   Type 2 diabetes mellitus, uncontrolled 04/12/2011   Hypothyroid 04/12/2011   Gait abnormality 06/22/2010    Tibial tendinitis, posterior 06/22/2010    Past Surgical History:  Procedure Laterality Date   AXILLARY HIDRADENITIS EXCISION Bilateral    CHOLECYSTECTOMY     HERNIA REPAIR     multiple   INCISIONAL HERNIA REPAIR  05/30/2015   LAPROSCOPIC   INCISIONAL HERNIA REPAIR N/A 05/30/2015   Procedure: LAPAROSCOPIC INCISIONAL HERNIA;  Surgeon: Rolm Bookbinder, MD;  Location: [facility] OR;  Service: General;  Laterality: N/A;   INSERTION OF MESH N/A 05/30/2015   Procedure: INSERTION OF MESH;  Surgeon: Rolm Bookbinder, MD;  Location: [facility] OR;  Service: General;  Laterality: N/A;   KNEE ARTHROSCOPY     right knee   LAPAROSCOPY N/A 05/30/2015   Procedure: LAPAROSCOPY DIAGNOSTIC;  Surgeon: Rolm Bookbinder, MD;  Location: [facility];  Service: General;  Laterality: N/A;   PARTIAL KNEE ARTHROPLASTY Right 10/30/2017   Procedure: UNICOMPARTMENTAL RIGHT KNEE LATERAL;  Surgeon: Paralee Cancel, MD;  Location: [facility];  Service: Orthopedics;  Laterality: Right;  90 mins   PARTIAL KNEE ARTHROPLASTY Left 06/29/2020   Procedure: UNICOMPARTMENTAL KNEE LATERALLY;  Surgeon: Paralee Cancel, MD;  Location: [facility];  Service: Orthopedics;  Laterality: Left;  70 MINS   TONSILLECTOMY     adenoids    OB History   No obstetric history on file.      Home  Medications    Prior to Admission medications   Medication Sig Start Date End Date Taking? Authorizing Provider  acetaminophen (TYLENOL) 325 MG tablet Take 650 mg by mouth every 6 (six) hours as needed for mild pain.     [provider]  allopurinol (ZYLOPRIM) 300 MG tablet TAKE 1 TABLET EVERY DAY 11/16/21   Copland, Gay Filler, MD  Blood Glucose Monitoring Suppl (BLOOD GLUCOSE METER KIT AND SUPPLIES) Dispense based on patient and insurance preference. To check blood sugars daily FOR ICD-9 250.00 Patient taking differently: 1 each by Other route See admin instructions. Dispense based on patient and insurance preference. To check blood sugars daily FOR ICD-9 250.00  08/31/13   Darlyne Russian, MD  cholestyramine (QUESTRAN) 4 g packet DISSOLVE AND TAKE 1 PACKET OF POWDER BY MOUTH TWICE DAILY 05/01/21   Copland, Gay Filler, MD  dicyclomine (BENTYL) 20 MG tablet Take 20 mg by mouth daily as needed for spasms.    [provider]  empagliflozin (JARDIANCE) 25 MG TABS tablet Take 1 tablet (25 mg total) by mouth daily. 07/10/21   Copland, Gay Filler, MD  fenofibrate micronized (LOFIBRA) 134 MG capsule Take 134 mg by mouth daily before breakfast.    [provider]  FLUCELVAX QUADRIVALENT 0.5 ML injection  11/23/21   [provider]  Glucose Blood (BLOOD GLUCOSE TEST STRIPS) STRP 1 each by Other route See admin instructions. Use to test blood sugar up to twice a day 08/13/19   Copland, Gay Filler, MD  glucose blood (FREESTYLE TEST STRIPS) test strip Use as directed to test blood sugar up to twice a day. Dx code E11.9 08/16/19   Copland, Gay Filler, MD  glucose blood (TRUE METRIX BLOOD GLUCOSE TEST) test strip Use as instructed to check glucose up to 2 times daily. Dx Code E11.9 08/16/19   Copland, Gay Filler, MD  levothyroxine (SYNTHROID) 150 MCG tablet TAKE 1 TABLET EVERY DAY BEFORE BREAKFAST 08/10/21   Copland, Gay Filler, MD  metaxalone (SKELAXIN) 800 MG tablet Take 1 tablet (800 mg total) by mouth 3 (three) times daily as needed for muscle spasms. 12/11/21   Copland, Gay Filler, MD  metFORMIN (GLUCOPHAGE) 1000 MG tablet TAKE 1 TABLET TWICE DAILY 01/04/22   Copland, Gay Filler, MD  norethindrone (AYGESTIN) 5 MG tablet Take 1 tablet (5 mg total) by mouth in the morning and at bedtime. 09/25/21   Megan Salon, MD  nystatin (MYCOSTATIN/NYSTOP) powder Apply 1 application. topically 3 (three) times daily. 05/24/21   Copland, Gay Filler, MD  ONE TOUCH ULTRA TEST test strip USE TO CHECK BLOOD SUGAR ONCE DAILY (ICD CODE:25.00) Patient taking differently: 1 each by Other route daily. 05/08/13   Weber, Damaris Hippo, PA-C  tirzepatide [facility]) 7.5 MG/0.5ML Pen 7.52m 02/20/21    [provider]  TOUJEO SOLOSTAR 300 UNIT/ML Solostar Pen Inject into the skin. 10/31/21   [provider]  triazolam (HALCION) 0.25 MG tablet 1-2 tabs PO 2 hours before procedure or imaging.  Do not drive with this medication. 01/15/22   TSilverio Decamp MD  TRUEplus Lancets 30G MISC Use as directed to check glucose up to two times daily. Dx code E11.9 08/16/19   Copland, JGay Filler MD    Family History Family History  Problem Relation Age of Onset   Thyroid cancer Mother    Diabetes type II Mother    High Cholesterol Mother    Heart disease Mother    Diabetes type II Father  Hypertension Father    Stroke Father     Social History Social History   Tobacco Use   Smoking status: Never   Smokeless tobacco: Never  Vaping Use   Vaping Use: Never used  Substance Use Topics   Alcohol use: No    Alcohol/week: 0.0 standard drinks of alcohol   Drug use: No     Allergies   Vancomycin, Cefuroxime axetil, and Sulfa antibiotics   Review of Systems Review of Systems   Physical Exam Triage Vital Signs ED Triage Vitals  Enc Vitals Group     BP 02/05/22 1602 114/75     Pulse Rate 02/05/22 1602 99     Resp 02/05/22 1602 (!) 24     Temp 02/05/22 1602 99.1 F (37.3 C)     Temp Source 02/05/22 1602 Oral     SpO2 02/05/22 1602 98 %     Weight --      Height --      Head Circumference --      Peak Flow --      Pain Score 02/05/22 1601 7     Pain Loc --      Pain Edu? --      Excl. in [place]? --    No data found.  Updated Vital Signs BP 114/75 (BP Location: Left Arm)   Pulse 99   Temp 99.1 F (37.3 C) (Oral)   Resp (!) 24   SpO2 98%   Visual Acuity Right Eye Distance:   Left Eye Distance:   Bilateral Distance:    Right Eye Near:   Left Eye Near:    Bilateral Near:     Physical Exam Vitals reviewed.  Constitutional:      General: She is not in acute distress.    Appearance: She is not ill-appearing, toxic-appearing or diaphoretic.   Musculoskeletal:     Comments: There is profound swelling and erythema of the distal right middle finger over the distal and middle phalanx.  There is yellow discoloration and fluctuance at the end.  That yellowness extends across the finger.  Skin:    Coloration: Skin is not pale.  Neurological:     Mental Status: She is alert and oriented to person, place, and time.      UC Treatments / Results  Labs (all labs ordered are listed, but only abnormal results are displayed) Labs Reviewed - No data to display  EKG   Radiology No results found.  Procedures Procedures (including critical care time)  Medications Ordered in UC Medications - No data to display  Initial Impression / Assessment and Plan / UC Course  I have reviewed the triage vital signs and the nursing notes.  Pertinent labs & imaging results that were available during my care of the patient were reviewed by me and considered in my medical decision making (see chart for details).        We discussed the possibility of trying to drain the abscess of the fingertip here.  I discussed also that it might be difficult to get the inflamed area numb.  When I was about to inject her base of her right middle finger, on exam pinkness and some induration was noted of the hand over the metacarpal joints.  Then also faint erythema was noted further proximal on her forearm on the dorsum.  The attempt at anesthesia was aborted before anything was ever injected.  I have asked the patient and her mom to proceed to the emergency  room for further evaluation and treatment there. Final Clinical Impressions(s) / UC Diagnoses   Final diagnoses:  Finger pulp abscess, right  Cellulitis and abscess of upper extremity     Discharge Instructions      Patient will proceed to the emergency room for further evaluation and definitive treatment.     ED Prescriptions   None    PDMP not reviewed this encounter.   Barrett Henle,  MD 02/05/22 [phone]

## 2022-02-05 NOTE — ED Notes (Signed)
Assumed care of pt.  Pt and her Mother were sent from Urgent care.    Pt has an infected middle finger on her right hand.  The finger is swollen and draining, discolored (white and shades of purple).  The swelling is moving up her finger and appear to be starting to move into her knuckles.  Her skin is very tight as well.    Pt is alert and oriented w/ a very pleasant disposition.  No needs identified at this time.

## 2022-02-05 NOTE — ED Provider Notes (Signed)
[facility] DEPARTMENT Provider Note   CSN: [id number] Arrival date & time: 02/05/22  1811     History  Chief Complaint  Patient presents with   Nail Problem    Wanda Oneill is a 42 y.o. female.  Presents with right middle finger infection.  She states that she has had pain and swelling to the right middle finger for about 4 days now.  The mother is at bedside and states that usually she will tell them earlier if she has an infection.  She has type 2 diabetes and the mother states that she has relatively frequent infections.  Her father is a family medicine provider and will usually lance these and she is placed on antibiotics.  She states that she did not tell them and so it is progressed.  The patient complains of pain.  She denies having any fevers.  Denies any nausea.  HPI     Home Medications Prior to Admission medications   Medication Sig Start Date End Date Taking? Authorizing Provider  acetaminophen (TYLENOL) 325 MG tablet Take 650 mg by mouth every 6 (six) hours as needed for mild pain.     [provider]  allopurinol (ZYLOPRIM) 300 MG tablet TAKE 1 TABLET EVERY DAY 11/16/21   Copland, Gay Filler, MD  Blood Glucose Monitoring Suppl (BLOOD GLUCOSE METER KIT AND SUPPLIES) Dispense based on patient and insurance preference. To check blood sugars daily FOR ICD-9 250.00 Patient taking differently: 1 each by Other route See admin instructions. Dispense based on patient and insurance preference. To check blood sugars daily FOR ICD-9 250.00 08/31/13   Darlyne Russian, MD  cholestyramine (QUESTRAN) 4 g packet DISSOLVE AND TAKE 1 PACKET OF POWDER BY MOUTH TWICE DAILY 05/01/21   Copland, Gay Filler, MD  dicyclomine (BENTYL) 20 MG tablet Take 20 mg by mouth daily as needed for spasms.    [provider]  empagliflozin (JARDIANCE) 25 MG TABS tablet Take 1 tablet (25 mg total) by mouth daily. 07/10/21   Copland, Gay Filler, MD  fenofibrate micronized  (LOFIBRA) 134 MG capsule Take 134 mg by mouth daily before breakfast.    [provider]  FLUCELVAX QUADRIVALENT 0.5 ML injection  11/23/21   [provider]  Glucose Blood (BLOOD GLUCOSE TEST STRIPS) STRP 1 each by Other route See admin instructions. Use to test blood sugar up to twice a day 08/13/19   Copland, Gay Filler, MD  glucose blood (FREESTYLE TEST STRIPS) test strip Use as directed to test blood sugar up to twice a day. Dx code E11.9 08/16/19   Copland, Gay Filler, MD  glucose blood (TRUE METRIX BLOOD GLUCOSE TEST) test strip Use as instructed to check glucose up to 2 times daily. Dx Code E11.9 08/16/19   Copland, Gay Filler, MD  levothyroxine (SYNTHROID) 150 MCG tablet TAKE 1 TABLET EVERY DAY BEFORE BREAKFAST 08/10/21   Copland, Gay Filler, MD  metaxalone (SKELAXIN) 800 MG tablet Take 1 tablet (800 mg total) by mouth 3 (three) times daily as needed for muscle spasms. 12/11/21   Copland, Gay Filler, MD  metFORMIN (GLUCOPHAGE) 1000 MG tablet TAKE 1 TABLET TWICE DAILY 01/04/22   Copland, Gay Filler, MD  norethindrone (AYGESTIN) 5 MG tablet Take 1 tablet (5 mg total) by mouth in the morning and at bedtime. 09/25/21   Megan Salon, MD  nystatin (MYCOSTATIN/NYSTOP) powder Apply 1 application. topically 3 (three) times daily. 05/24/21   Copland, Gay Filler, MD  ONE Margurite Auerbach  TEST test strip USE TO CHECK BLOOD SUGAR ONCE DAILY (ICD CODE:25.00) Patient taking differently: 1 each by Other route daily. 05/08/13   Weber, Damaris Hippo, PA-C  tirzepatide [facility]) 7.5 MG/0.5ML Pen 7.42m 02/20/21   [provider]  TOUJEO SOLOSTAR 300 UNIT/ML Solostar Pen Inject into the skin. 10/31/21   [provider]  triazolam (HALCION) 0.25 MG tablet 1-2 tabs PO 2 hours before procedure or imaging.  Do not drive with this medication. 01/15/22   TSilverio Decamp MD  TRUEplus Lancets 30G MISC Use as directed to check glucose up to two times daily. Dx code E11.9 08/16/19   Copland, JGay Filler MD       Allergies    Vancomycin, Cefuroxime axetil, and Sulfa antibiotics    Review of Systems   Review of Systems  Skin:  Positive for wound.  All other systems reviewed and are negative.   Physical Exam Updated Vital Signs BP 124/78   Pulse 92   Temp 99.3 F (37.4 C) (Oral)   Resp (!) 22   Ht _0  (1.626 m)   Wt 111.1 kg   SpO2 100%   BMI 42.05 kg/m  Physical Exam Vitals and nursing note reviewed.  Constitutional:      General: She is not in acute distress.    Appearance: Normal appearance. She is ill-appearing. She is not toxic-appearing.  HENT:     Head: Normocephalic.     Mouth/Throat:     Mouth: Mucous membranes are moist.     Pharynx: Oropharynx is clear.  Eyes:     General: No scleral icterus. Cardiovascular:     Rate and Rhythm: Normal rate and regular rhythm.     Pulses: Normal pulses.     Heart sounds: No murmur heard. Pulmonary:     Effort: Pulmonary effort is normal. No respiratory distress.     Breath sounds: Normal breath sounds.  Abdominal:     General: Bowel sounds are normal.     Palpations: Abdomen is soft.  Musculoskeletal:        General: Swelling and tenderness present.  Skin:    General: Skin is warm and dry.     Capillary Refill: Capillary refill takes less than 2 seconds.     Findings: Erythema present.  Neurological:     General: No focal deficit present.     Mental Status: She is alert and oriented to person, place, and time. Mental status is at baseline.  Psychiatric:        Mood and Affect: Mood normal.        Behavior: Behavior normal.          ED Results / Procedures / Treatments   Labs (all labs ordered are listed, but only abnormal results are displayed) Labs Reviewed  COMPREHENSIVE METABOLIC PANEL - Abnormal; Notable for the following components:      Result Value   Sodium 134 (*)    CO2 21 (*)    Glucose, Bld 289 (*)    Calcium 8.5 (*)    Albumin 3.1 (*)    Alkaline Phosphatase 35 (*)    All other components  within normal limits  CBC WITH DIFFERENTIAL/PLATELET - Abnormal; Notable for the following components:   WBC 13.5 (*)    RDW 16.3 (*)    Platelets 403 (*)    Neutro Abs 10.0 (*)    Abs Immature Granulocytes 0.33 (*)    All other components within normal limits  CBG MONITORING, ED -  Abnormal; Notable for the following components:   Glucose-Capillary 281 (*)    All other components within normal limits  PREGNANCY, URINE    EKG None  Radiology DG Finger Middle Right  Result Date: 02/05/2022 CLINICAL DATA:  Pain and swelling in the distal third digit EXAM: RIGHT MIDDLE FINGER 2+V COMPARISON:  11/26/2020 FINDINGS: Transverse lucency is noted in the distal phalanx of the third digit consistent with prior fracture and nonunion. Multiple bony fragments are noted. Some erosive changes are seen suspicious for osteomyelitis. Considerable soft tissue swelling is noted. IMPRESSION: Changes in the third distal phalanx suggestive of prior fracture and nonunion although some rows of changes are seen consistent with osteomyelitis. Considerable soft tissue swelling is noted. Electronically Signed   By: Inez Catalina M.D.   On: 02/05/2022 22:40    Procedures Procedures    Medications Ordered in ED Medications  piperacillin-tazobactam (ZOSYN) IVPB 3.375 g (3.375 g Intravenous New Bag/Given 02/05/22 2327)  doxycycline (VIBRAMYCIN) 100 mg in sodium chloride 0.9 % 250 mL IVPB (has no administration in time range)    ED Course/ Medical Decision Making/ A&P                           Medical Decision Making Amount and/or Complexity of Data Reviewed Labs: ordered. Radiology: ordered.  Risk Prescription drug management. Decision regarding hospitalization.  Initial Impression and Ddx 42 year old female who presents to the emergency department with right middle finger wound, swelling and pain.  On initial exam she has obvious flexor tenosynovitis.  There is diffuse, circumferential swelling to the right  middle finger.  There appears to be a felon.  Additionally there is streaking redness up the dorsal surface of the hand into the forearm.  There is tenderness to palpation.  The nail is black and there is some serosanguineous drainage. Patient PMH that increases complexity of ED encounter: Down syndrome, diabetes  Interpretation of Diagnostics I independent reviewed and interpreted the labs as followed: White count 13.5  - I independently visualized the following imaging with scope of interpretation limited to determining acute life threatening conditions related to emergency care: Plain film of the right little finger, which revealed osteomyelitis  Patient Reassessment and Ultimate Disposition/Management Initial exam with obvious flexor tenosynovitis.  I obtained a plain film which also shows underlying osteo.  White count is 13.5.  She has low-grade temp of 99.3.  She is ill-appearing but does not appear floridly septic. Consulted immediately with hand surgery and spoke with Dr. Fredna Dow.  He is requesting hospitalist admission and will post the patient to the OR soon as possible.  The patient last ate about an hour prior to consult. I also consulted with the ED pharmacist that she has allergies to vancomycin, sulfa, single cephalosporin.  Opted to place her on Zosyn and doxycycline at this time given that we are at [facility].  She can be switched to linezolid when she is at the main hospital. I updated the patient and mother at bedside.  They are agreeable to the plan.  She will need to be transferred over to [facility].  There was discussion about sending her ED to ED prior to going to the PACU, however will admit to hospitalist and plan to transport via [facility] to PACU.  I did speak with Dr. Roxanne Mins, ED attending and [facility] who is willing to accept the patient if that ends up being a faster course of treatment.  Consulted and spoke with Dr. Nevada Crane, hospitalist who agrees to admit the  patient.  Patient management required discussion with the following services or consulting groups:  Hospitalist Service and Hand Surgery  Complexity of Problems Addressed Acute illness or injury that poses threat of life of bodily function  Additional Data Reviewed and Analyzed Further history obtained from: Further history from spouse/family member, Past medical history and medications listed in the EMR, Care Everywhere, and Prior labs/imaging results  Patient Encounter Risk Assessment SDOH impact on management, Consideration of hospitalization, and Major procedures  Final Clinical Impression(s) / ED Diagnoses Final diagnoses:  Flexor tenosynovitis of finger    Rx / DC Orders ED Discharge Orders     None         Mickie Hillier, PA-C 02/06/22 0004    Audley Hose, MD 02/06/22 [phone]

## 2022-02-05 NOTE — Discharge Instructions (Signed)
Patient will proceed to the emergency room for further evaluation and definitive treatment.

## 2022-02-05 NOTE — ED Triage Notes (Addendum)
Pt reports infection, pain and swelling to right middle finger. No known injury. Distal tip of finger appears swollen & red with purulent yellowing skin around it. Swelling extends past her knuckle. She noticed it about 3-4 days ago.

## 2022-02-06 ENCOUNTER — Other Ambulatory Visit: Payer: Self-pay

## 2022-02-06 ENCOUNTER — Emergency Department (HOSPITAL_COMMUNITY): Payer: Medicare HMO | Admitting: Anesthesiology

## 2022-02-06 ENCOUNTER — Encounter (HOSPITAL_COMMUNITY): Payer: Self-pay | Admitting: Internal Medicine

## 2022-02-06 ENCOUNTER — Encounter (HOSPITAL_COMMUNITY)
Admission: EM | Disposition: A | Discharge status: Home Health | Payer: Self-pay | Source: Home / Self Care | Attending: Internal Medicine

## 2022-02-06 DIAGNOSIS — M8618 Other acute osteomyelitis, other site: Secondary | ICD-10-CM | POA: Diagnosis not present

## 2022-02-06 DIAGNOSIS — Z83438 Family history of other disorder of lipoprotein metabolism and other lipidemia: Secondary | ICD-10-CM | POA: Diagnosis not present

## 2022-02-06 DIAGNOSIS — E1169 Type 2 diabetes mellitus with other specified complication: Secondary | ICD-10-CM | POA: Diagnosis present

## 2022-02-06 DIAGNOSIS — M869 Osteomyelitis, unspecified: Secondary | ICD-10-CM | POA: Diagnosis not present

## 2022-02-06 DIAGNOSIS — L0292 Furuncle, unspecified: Secondary | ICD-10-CM | POA: Diagnosis present

## 2022-02-06 DIAGNOSIS — J189 Pneumonia, unspecified organism: Secondary | ICD-10-CM | POA: Diagnosis not present

## 2022-02-06 DIAGNOSIS — L03011 Cellulitis of right finger: Secondary | ICD-10-CM | POA: Diagnosis present

## 2022-02-06 DIAGNOSIS — M6510 Other infective (teno)synovitis, unspecified site: Secondary | ICD-10-CM | POA: Diagnosis present

## 2022-02-06 DIAGNOSIS — E119 Type 2 diabetes mellitus without complications: Secondary | ICD-10-CM

## 2022-02-06 DIAGNOSIS — E039 Hypothyroidism, unspecified: Secondary | ICD-10-CM

## 2022-02-06 DIAGNOSIS — M659 Synovitis and tenosynovitis, unspecified: Secondary | ICD-10-CM | POA: Diagnosis present

## 2022-02-06 DIAGNOSIS — Z833 Family history of diabetes mellitus: Secondary | ICD-10-CM | POA: Diagnosis not present

## 2022-02-06 DIAGNOSIS — Z8616 Personal history of COVID-19: Secondary | ICD-10-CM | POA: Diagnosis not present

## 2022-02-06 DIAGNOSIS — D72829 Elevated white blood cell count, unspecified: Secondary | ICD-10-CM | POA: Diagnosis present

## 2022-02-06 DIAGNOSIS — K589 Irritable bowel syndrome without diarrhea: Secondary | ICD-10-CM | POA: Diagnosis present

## 2022-02-06 DIAGNOSIS — E781 Pure hyperglyceridemia: Secondary | ICD-10-CM | POA: Diagnosis present

## 2022-02-06 DIAGNOSIS — E1165 Type 2 diabetes mellitus with hyperglycemia: Secondary | ICD-10-CM | POA: Diagnosis present

## 2022-02-06 DIAGNOSIS — Z882 Allergy status to sulfonamides status: Secondary | ICD-10-CM | POA: Diagnosis not present

## 2022-02-06 DIAGNOSIS — Z888 Allergy status to other drugs, medicaments and biological substances status: Secondary | ICD-10-CM | POA: Diagnosis not present

## 2022-02-06 DIAGNOSIS — L089 Local infection of the skin and subcutaneous tissue, unspecified: Secondary | ICD-10-CM

## 2022-02-06 DIAGNOSIS — Z808 Family history of malignant neoplasm of other organs or systems: Secondary | ICD-10-CM | POA: Diagnosis not present

## 2022-02-06 DIAGNOSIS — B951 Streptococcus, group B, as the cause of diseases classified elsewhere: Secondary | ICD-10-CM | POA: Diagnosis present

## 2022-02-06 DIAGNOSIS — M868X8 Other osteomyelitis, other site: Secondary | ICD-10-CM | POA: Diagnosis present

## 2022-02-06 DIAGNOSIS — E66813 Obesity, class 3: Secondary | ICD-10-CM

## 2022-02-06 DIAGNOSIS — Z96653 Presence of artificial knee joint, bilateral: Secondary | ICD-10-CM | POA: Diagnosis present

## 2022-02-06 DIAGNOSIS — Z881 Allergy status to other antibiotic agents status: Secondary | ICD-10-CM | POA: Diagnosis not present

## 2022-02-06 DIAGNOSIS — Q909 Down syndrome, unspecified: Secondary | ICD-10-CM | POA: Diagnosis not present

## 2022-02-06 DIAGNOSIS — Z794 Long term (current) use of insulin: Secondary | ICD-10-CM

## 2022-02-06 DIAGNOSIS — L03012 Cellulitis of left finger: Secondary | ICD-10-CM | POA: Diagnosis present

## 2022-02-06 DIAGNOSIS — M65841 Other synovitis and tenosynovitis, right hand: Secondary | ICD-10-CM | POA: Diagnosis present

## 2022-02-06 DIAGNOSIS — B9561 Methicillin susceptible Staphylococcus aureus infection as the cause of diseases classified elsewhere: Secondary | ICD-10-CM | POA: Diagnosis present

## 2022-02-06 DIAGNOSIS — Z6841 Body Mass Index (BMI) 40.0 and over, adult: Secondary | ICD-10-CM | POA: Diagnosis not present

## 2022-02-06 HISTORY — PX: INCISION AND DRAINAGE ABSCESS: SHX5864

## 2022-02-06 HISTORY — PX: INCISION AND DRAINAGE WOUND WITH NAILBED REPAIR: SHX5636

## 2022-02-06 LAB — GLUCOSE, CAPILLARY: Glucose-Capillary: 133 mg/dL — ABNORMAL HIGH (ref 70–99)

## 2022-02-06 LAB — CBG MONITORING, ED: Glucose-Capillary: 147 mg/dL — ABNORMAL HIGH (ref 70–99)

## 2022-02-06 SURGERY — INCISION AND DRAINAGE, ABSCESS
Anesthesia: General | Site: Index Finger | Laterality: Right

## 2022-02-06 MED ORDER — NORETHINDRONE ACETATE 5 MG PO TABS
5.0000 mg | ORAL_TABLET | Freq: Two times a day (BID) | ORAL | Status: DC
  Administered 2022-02-06 – 2022-02-09 (×6): 5 mg via ORAL
  Filled 2022-02-06 (×7): qty 1

## 2022-02-06 MED ORDER — MIDAZOLAM HCL 2 MG/2ML IJ SOLN
INTRAMUSCULAR | Status: AC
  Filled 2022-02-06: qty 2

## 2022-02-06 MED ORDER — DEXAMETHASONE SODIUM PHOSPHATE 10 MG/ML IJ SOLN
INTRAMUSCULAR | Status: DC | PRN
  Administered 2022-02-06: 5 mg via INTRAVENOUS

## 2022-02-06 MED ORDER — CHLORHEXIDINE GLUCONATE 0.12 % MT SOLN: 15.0000 mL | Freq: Once | OROMUCOSAL | Status: AC

## 2022-02-06 MED ORDER — HYDROCODONE-ACETAMINOPHEN 5-325 MG PO TABS
1.0000 | ORAL_TABLET | ORAL | Status: DC | PRN
  Administered 2022-02-06: 2 via ORAL
  Administered 2022-02-07 – 2022-02-08 (×2): 1 via ORAL
  Filled 2022-02-06 (×2): qty 2
  Filled 2022-02-06 (×2): qty 1

## 2022-02-06 MED ORDER — EMPAGLIFLOZIN 25 MG PO TABS
25.0000 mg | ORAL_TABLET | Freq: Every day | ORAL | Status: DC
  Administered 2022-02-07 – 2022-02-09 (×3): 25 mg via ORAL
  Filled 2022-02-06 (×3): qty 1

## 2022-02-06 MED ORDER — BUPIVACAINE HCL (PF) 0.25 % IJ SOLN
INTRAMUSCULAR | Status: AC
  Filled 2022-02-06: qty 30

## 2022-02-06 MED ORDER — OXYCODONE HCL 5 MG PO TABS: 5.0000 mg | ORAL_TABLET | Freq: Once | ORAL | Status: DC | PRN

## 2022-02-06 MED ORDER — PHENYLEPHRINE 80 MCG/ML (10ML) SYRINGE FOR IV PUSH (FOR BLOOD PRESSURE SUPPORT)
PREFILLED_SYRINGE | INTRAVENOUS | Status: DC | PRN
  Administered 2022-02-06: 80 ug via INTRAVENOUS
  Administered 2022-02-06: 160 ug via INTRAVENOUS
  Administered 2022-02-06: 80 ug via INTRAVENOUS
  Administered 2022-02-06: 160 ug via INTRAVENOUS

## 2022-02-06 MED ORDER — LACTATED RINGERS IV SOLN: INTRAVENOUS | Status: DC

## 2022-02-06 MED ORDER — INSULIN GLARGINE-YFGN 100 UNIT/ML ~~LOC~~ SOLN
10.0000 [IU] | Freq: Every day | SUBCUTANEOUS | Status: DC
  Administered 2022-02-06 – 2022-02-08 (×3): 10 [IU] via SUBCUTANEOUS
  Filled 2022-02-06 (×4): qty 0.1

## 2022-02-06 MED ORDER — LIDOCAINE 2% (20 MG/ML) 5 ML SYRINGE
INTRAMUSCULAR | Status: DC | PRN
  Administered 2022-02-06: 100 mg via INTRAVENOUS

## 2022-02-06 MED ORDER — ALBUTEROL SULFATE (2.5 MG/3ML) 0.083% IN NEBU: 2.5000 mg | INHALATION_SOLUTION | Freq: Four times a day (QID) | RESPIRATORY_TRACT | Status: DC | PRN

## 2022-02-06 MED ORDER — ONDANSETRON HCL 4 MG/2ML IJ SOLN
INTRAMUSCULAR | Status: AC
  Filled 2022-02-06: qty 2

## 2022-02-06 MED ORDER — ONDANSETRON HCL 4 MG PO TABS: 4.0000 mg | ORAL_TABLET | Freq: Four times a day (QID) | ORAL | Status: DC | PRN

## 2022-02-06 MED ORDER — PROPOFOL 10 MG/ML IV BOLUS
INTRAVENOUS | Status: AC
  Filled 2022-02-06: qty 20

## 2022-02-06 MED ORDER — INSULIN ASPART 100 UNIT/ML IJ SOLN
0.0000 [IU] | INTRAMUSCULAR | Status: DC | PRN
  Administered 2022-02-06: 2 [IU] via SUBCUTANEOUS
  Filled 2022-02-06: qty 1

## 2022-02-06 MED ORDER — ORAL CARE MOUTH RINSE: 15.0000 mL | Freq: Once | OROMUCOSAL | Status: AC

## 2022-02-06 MED ORDER — DEXAMETHASONE SODIUM PHOSPHATE 10 MG/ML IJ SOLN
INTRAMUSCULAR | Status: AC
  Filled 2022-02-06: qty 1

## 2022-02-06 MED ORDER — ENOXAPARIN SODIUM 40 MG/0.4ML IJ SOSY
40.0000 mg | PREFILLED_SYRINGE | INTRAMUSCULAR | Status: DC
  Administered 2022-02-06: 40 mg via SUBCUTANEOUS
  Filled 2022-02-06: qty 0.4

## 2022-02-06 MED ORDER — MEPERIDINE HCL 25 MG/ML IJ SOLN: 6.2500 mg | INTRAMUSCULAR | Status: DC | PRN

## 2022-02-06 MED ORDER — FENTANYL CITRATE (PF) 250 MCG/5ML IJ SOLN
INTRAMUSCULAR | Status: DC | PRN
  Administered 2022-02-06 (×2): 100 ug via INTRAVENOUS
  Administered 2022-02-06: 50 ug via INTRAVENOUS

## 2022-02-06 MED ORDER — MORPHINE SULFATE (PF) 2 MG/ML IV SOLN: 2.0000 mg | INTRAVENOUS | Status: DC | PRN

## 2022-02-06 MED ORDER — CHLORHEXIDINE GLUCONATE 0.12 % MT SOLN
OROMUCOSAL | Status: AC
  Administered 2022-02-06: 15 mL via OROMUCOSAL
  Filled 2022-02-06: qty 15

## 2022-02-06 MED ORDER — MIDAZOLAM HCL 2 MG/2ML IJ SOLN
INTRAMUSCULAR | Status: DC | PRN
  Administered 2022-02-06: 2 mg via INTRAVENOUS

## 2022-02-06 MED ORDER — CHOLESTYRAMINE 4 G PO PACK
4.0000 g | PACK | Freq: Two times a day (BID) | ORAL | Status: DC
  Administered 2022-02-06 – 2022-02-09 (×6): 4 g via ORAL
  Filled 2022-02-06 (×7): qty 1

## 2022-02-06 MED ORDER — SODIUM CHLORIDE 0.9% FLUSH
3.0000 mL | Freq: Two times a day (BID) | INTRAVENOUS | Status: DC
  Administered 2022-02-07 – 2022-02-09 (×4): 3 mL via INTRAVENOUS

## 2022-02-06 MED ORDER — 0.9 % SODIUM CHLORIDE (POUR BTL) OPTIME
TOPICAL | Status: DC | PRN
  Administered 2022-02-06: 1000 mL

## 2022-02-06 MED ORDER — ONDANSETRON HCL 4 MG/2ML IJ SOLN: 4.0000 mg | Freq: Four times a day (QID) | INTRAMUSCULAR | Status: DC | PRN

## 2022-02-06 MED ORDER — PIPERACILLIN-TAZOBACTAM 3.375 G IVPB 30 MIN
3.3750 g | Freq: Three times a day (TID) | INTRAVENOUS | Status: DC
  Administered 2022-02-06 – 2022-02-07 (×4): 3.375 g via INTRAVENOUS
  Filled 2022-02-06 (×8): qty 50

## 2022-02-06 MED ORDER — ONDANSETRON HCL 4 MG/2ML IJ SOLN
INTRAMUSCULAR | Status: DC | PRN
  Administered 2022-02-06: 4 mg via INTRAVENOUS

## 2022-02-06 MED ORDER — FENOFIBRATE 160 MG PO TABS
160.0000 mg | ORAL_TABLET | Freq: Every day | ORAL | Status: DC
  Administered 2022-02-07 – 2022-02-09 (×3): 160 mg via ORAL
  Filled 2022-02-06 (×3): qty 1

## 2022-02-06 MED ORDER — OXYCODONE HCL 5 MG/5ML PO SOLN: 5.0000 mg | Freq: Once | ORAL | Status: DC | PRN

## 2022-02-06 MED ORDER — GUAIFENESIN ER 600 MG PO TB12
600.0000 mg | ORAL_TABLET | Freq: Two times a day (BID) | ORAL | Status: DC
  Administered 2022-02-06 – 2022-02-09 (×6): 600 mg via ORAL
  Filled 2022-02-06 (×7): qty 1

## 2022-02-06 MED ORDER — ACETAMINOPHEN 325 MG PO TABS
650.0000 mg | ORAL_TABLET | Freq: Four times a day (QID) | ORAL | Status: DC | PRN
  Administered 2022-02-07: 650 mg via ORAL
  Filled 2022-02-06: qty 2

## 2022-02-06 MED ORDER — FENTANYL CITRATE (PF) 250 MCG/5ML IJ SOLN
INTRAMUSCULAR | Status: AC
  Filled 2022-02-06: qty 5

## 2022-02-06 MED ORDER — ACETAMINOPHEN 650 MG RE SUPP: 650.0000 mg | Freq: Four times a day (QID) | RECTAL | Status: DC | PRN

## 2022-02-06 MED ORDER — FENTANYL CITRATE (PF) 100 MCG/2ML IJ SOLN: 25.0000 ug | INTRAMUSCULAR | Status: DC | PRN

## 2022-02-06 MED ORDER — HYDROMORPHONE HCL 1 MG/ML IJ SOLN: 0.2500 mg | INTRAMUSCULAR | Status: DC | PRN

## 2022-02-06 MED ORDER — BUPIVACAINE HCL (PF) 0.25 % IJ SOLN
INTRAMUSCULAR | Status: DC | PRN
  Administered 2022-02-06 (×2): 9 mL

## 2022-02-06 MED ORDER — PROMETHAZINE HCL 25 MG/ML IJ SOLN: 6.2500 mg | INTRAMUSCULAR | Status: DC | PRN

## 2022-02-06 MED ORDER — LEVOTHYROXINE SODIUM 150 MCG PO TABS
150.0000 ug | ORAL_TABLET | Freq: Every day | ORAL | Status: DC
  Administered 2022-02-07 – 2022-02-09 (×3): 150 ug via ORAL
  Filled 2022-02-06 (×5): qty 1

## 2022-02-06 MED ORDER — ALLOPURINOL 300 MG PO TABS
300.0000 mg | ORAL_TABLET | Freq: Every day | ORAL | Status: DC
  Administered 2022-02-07 – 2022-02-09 (×3): 300 mg via ORAL
  Filled 2022-02-06 (×3): qty 1

## 2022-02-06 MED ORDER — ACETAMINOPHEN 325 MG PO TABS: 325.0000 mg | ORAL_TABLET | ORAL | Status: DC | PRN

## 2022-02-06 MED ORDER — PROPOFOL 10 MG/ML IV BOLUS
INTRAVENOUS | Status: DC | PRN
  Administered 2022-02-06: 200 mg via INTRAVENOUS

## 2022-02-06 MED ORDER — ACETAMINOPHEN 160 MG/5ML PO SOLN: 325.0000 mg | ORAL | Status: DC | PRN

## 2022-02-06 MED ORDER — ONDANSETRON HCL 4 MG/2ML IJ SOLN: 4.0000 mg | Freq: Once | INTRAMUSCULAR | Status: DC | PRN

## 2022-02-06 MED ORDER — SODIUM CHLORIDE 0.9 % IV SOLN
100.0000 mg | Freq: Two times a day (BID) | INTRAVENOUS | Status: DC
  Administered 2022-02-06 – 2022-02-07 (×3): 100 mg via INTRAVENOUS
  Filled 2022-02-06 (×4): qty 100

## 2022-02-06 SURGICAL SUPPLY — 60 items
ADAPTER CATH SYR TO TUBING 38M (ADAPTER) ×2 IMPLANT
BAG COUNTER SPONGE SURGICOUNT (BAG) ×2 IMPLANT
BNDG COHESIVE 1X5 TAN STRL LF (GAUZE/BANDAGES/DRESSINGS) IMPLANT
BNDG COHESIVE 2X5 TAN STRL LF (GAUZE/BANDAGES/DRESSINGS) IMPLANT
BNDG ELASTIC 3X5.8 VLCR STR LF (GAUZE/BANDAGES/DRESSINGS) ×2 IMPLANT
BNDG ELASTIC 4X5.8 VLCR STR LF (GAUZE/BANDAGES/DRESSINGS) ×2 IMPLANT
BNDG ESMARK 4X9 LF (GAUZE/BANDAGES/DRESSINGS) IMPLANT
BNDG GAUZE DERMACEA FLUFF 4 (GAUZE/BANDAGES/DRESSINGS) ×2 IMPLANT
CANNULA VESSEL 3MM 2 BLNT TIP (CANNULA) IMPLANT
CORD BIPOLAR FORCEPS 12FT (ELECTRODE) ×2 IMPLANT
COVER SURGICAL LIGHT HANDLE (MISCELLANEOUS) ×2 IMPLANT
CUFF TOURN SGL QUICK 18X4 (TOURNIQUET CUFF) IMPLANT
CUFF TOURN SGL QUICK 24 (TOURNIQUET CUFF)
CUFF TRNQT CYL 24X4X16.5-23 (TOURNIQUET CUFF) IMPLANT
DRAIN PENROSE 1/4X12 LTX STRL (WOUND CARE) IMPLANT
DRAIN PENROSE 12X.25 LTX STRL (MISCELLANEOUS) IMPLANT
GAUZE PACKING IODOFORM 1/2INX (GAUZE/BANDAGES/DRESSINGS) IMPLANT
GAUZE PAD ABD 8X10 STRL (GAUZE/BANDAGES/DRESSINGS) ×4 IMPLANT
GAUZE SPONGE 4X4 12PLY STRL (GAUZE/BANDAGES/DRESSINGS) ×2 IMPLANT
GAUZE XEROFORM 1X8 LF (GAUZE/BANDAGES/DRESSINGS) ×2 IMPLANT
GLOVE BIO SURGEON STRL SZ7.5 (GLOVE) ×2 IMPLANT
GLOVE BIOGEL PI IND STRL 8 (GLOVE) ×2 IMPLANT
GLOVE BIOGEL PI IND STRL 8.5 (GLOVE) IMPLANT
GLOVE PI ORTHO PRO STRL SZ8 (GLOVE) IMPLANT
GLOVE SURG ORTHO 8.0 STRL STRW (GLOVE) IMPLANT
GOWN STRL REUS W/ TWL LRG LVL3 (GOWN DISPOSABLE) ×2 IMPLANT
GOWN STRL REUS W/ TWL XL LVL3 (GOWN DISPOSABLE) ×2 IMPLANT
GOWN STRL REUS W/TWL LRG LVL3 (GOWN DISPOSABLE) ×2
GOWN STRL REUS W/TWL XL LVL3 (GOWN DISPOSABLE) ×2
IV ADAPTER SYR DOUBLE MALE LL (MISCELLANEOUS) IMPLANT
KIT BASIN OR (CUSTOM PROCEDURE TRAY) ×2 IMPLANT
KIT TURNOVER KIT B (KITS) ×2 IMPLANT
LOOP VESSEL MAXI BLUE (MISCELLANEOUS) IMPLANT
MANIFOLD NEPTUNE II (INSTRUMENTS) IMPLANT
NDL HYPO 25GX1X1/2 BEV (NEEDLE) IMPLANT
NDL HYPO 25X1 1.5 SAFETY (NEEDLE) IMPLANT
NEEDLE HYPO 25GX1X1/2 BEV (NEEDLE) ×2 IMPLANT
NEEDLE HYPO 25X1 1.5 SAFETY (NEEDLE) IMPLANT
NS IRRIG 1000ML POUR BTL (IV SOLUTION) ×2 IMPLANT
PACK ORTHO EXTREMITY (CUSTOM PROCEDURE TRAY) ×2 IMPLANT
PAD ARMBOARD 7.5X6 YLW CONV (MISCELLANEOUS) ×4 IMPLANT
SET CYSTO W/LG BORE CLAMP LF (SET/KITS/TRAYS/PACK) IMPLANT
SOL PREP POV-IOD 4OZ 10% (MISCELLANEOUS) ×4 IMPLANT
SPIKE FLUID TRANSFER (MISCELLANEOUS) ×2 IMPLANT
SPLINT FINGER 4.25 BULB 911906 (SOFTGOODS) IMPLANT
SPONGE T-LAP 4X18 ~~LOC~~+RFID (SPONGE) ×2 IMPLANT
SUT ETHILON 4 0 P 3 18 (SUTURE) IMPLANT
SUT ETHILON 4 0 PS 2 18 (SUTURE) IMPLANT
SUT MON AB 5-0 P3 18 (SUTURE) IMPLANT
SWAB COLLECTION DEVICE MRSA (MISCELLANEOUS) IMPLANT
SWAB CULTURE ESWAB REG 1ML (MISCELLANEOUS) IMPLANT
SYR 20ML LL LF (SYRINGE) ×2 IMPLANT
SYR CONTROL 10ML LL (SYRINGE) IMPLANT
SYR TOOMEY IRRIG 70ML (MISCELLANEOUS) ×2
SYRINGE TOOMEY IRRIG 70ML (MISCELLANEOUS) IMPLANT
TOWEL GREEN STERILE (TOWEL DISPOSABLE) ×2 IMPLANT
TUBE CONNECTING 12X1/4 (SUCTIONS) ×2 IMPLANT
TUBE FEEDING ENTERAL 5FR 16IN (TUBING) IMPLANT
UNDERPAD 30X36 HEAVY ABSORB (UNDERPADS AND DIAPERS) ×2 IMPLANT
YANKAUER SUCT BULB TIP NO VENT (SUCTIONS) ×2 IMPLANT

## 2022-02-06 NOTE — Op Note (Signed)
NAME: Wanda Oneill MEDICAL RECORD NO: [id number] DATE OF BIRTH: [birth year] FACILITY: [facility] LOCATION: [facility] OR PHYSICIAN: Tennis Must, MD   OPERATIVE REPORT   DATE OF PROCEDURE: 02/06/22    PREOPERATIVE DIAGNOSIS: Right long finger infection and left index finger paronychia   POSTOPERATIVE DIAGNOSIS: Right long finger flexor sheath infection, distal phalanx osteomyelitis, DIP joint infection; left index finger paronychia   PROCEDURE: 1.  Right long finger incision and drainage flexor sheath infection 2.  Right long finger incision and drainage DIP joint 3.  Right long finger incision and drainage felon and distal phalanx osteomyelitis 4.  Left index finger incision and drainage paronychia with removal of nail plate   SURGEON:  Leanora Cover, M.D.   ASSISTANT: none   ANESTHESIA:  General   INTRAVENOUS FLUIDS:  Per anesthesia flow sheet.   ESTIMATED BLOOD LOSS:  Minimal.   COMPLICATIONS:  None.   SPECIMENS: Cultures to micro   TOURNIQUET TIME:    Total Tourniquet Time Documented: Upper Arm (Right) - 41 minutes Upper Arm (Right) - 4 minutes Total: Upper Arm (Right) - 45 minutes    DISPOSITION:  Stable to PACU.   INDICATIONS: 42 year old female has noted increasing pain swelling and erythema of the right long finger over the past several days.  No fevers or chills.  She was seen at [facility] yesterday and radiographs were taken revealing osteomyelitis of the distal phalanx.  She was transferred to [facility] for further care.  Recommended incision and drainage in the operating room including the flexor sheath, distal phalanx, and possibly DIP joint.  She also noted a paronychia developing of the left index finger and wished to have this treated as well.  Risks, benefits and alternatives of surgery were discussed including the risks of blood loss, infection, damage to nerves, vessels, tendons, ligaments, bone for surgery, need for additional surgery,  complications with wound healing, continued pain, stiffness, , need for repeat irrigation and debridement.  She voiced understanding of these risks and elected to proceed.  OPERATIVE COURSE:  After being identified preoperatively by myself,  the patient and I agreed on the procedure and site of the procedure.  The surgical site was marked.  Surgical consent had been signed. Preoperative IV antibiotic prophylaxis was given. She was transferred to the operating room and placed on the operating table in supine position with the right upper extremity on an arm board.  General anesthesia was induced by the anesthesiologist.  Right upper extremity was prepped and draped in normal sterile orthopedic fashion.  A surgical pause was performed between the surgeons, anesthesia, and operating room staff and all were in agreement as to the patient, procedure, and site of procedure.  Tourniquet at the proximal aspect of the extremity was inflated to 250 mmHg after exsanguination of the arm with an Esmarch bandage.  The wound was opened distally.  The nail plate was no longer present.  There was gross purulence from under the nailbed.  Cultures were taken for aerobes and anaerobes.  The nailbed was eroded at both the radial and ulnar sides.  The distal phalanx was visible through the wound.  There was fracture and bone loss.  The skin at the tip of the finger peeled off.  An incision was made at the distal phalanx volarly.  There was gross purulence encountered.  An additional incision was made over the middle phalanx volarly as there was swelling and erythema in this location as well.  There is  no gross purulence in the subcutaneous tissues.  An incision was made over the A1 pulley and carried in subcutaneous tissues by spreading technique.  The A1 pulley was opened.  There was fluid within the flexor sheath.  An additional incision was made at the radial side of the finger at the level of the DIP joint.  This was carried into  the DIP joint underneath the extensor tendon.  There was cloudy fluid within but not gross purulence.  And a provide pediatric feeding tube was advanced into the flexor sheath from proximally.  This was used to irrigate the sheath with sterile saline.  Effluent was obtained from the proximal wound but not the distal wound.  The feeding tube was then advanced into the sheath from distally.  Again this was used to irrigate the sheath.  The fluid was obtained from distally.  The DIP joint was irrigated with the feeding tube as well.  All wounds were then copiously irrigated with sterile saline by cystoscopy tubing.  The wounds were then packed with half-inch iodoform gauze including a wick in the DIP joint.  A digital block was performed with quarter percent plain Marcaine.  A piece of Xeroform was placed in nail fold.  The wounds were all dressed with sterile 4 x 4's and ABD and wrapped with Kerlix and Ace bandage.  The tourniquet was deflated at 41 minutes.  Fingertips were pink with brisk capillary refill after deflation of tourniquet.  The operative  drapes were broken down.  The left index finger was then sterilely prepped and draped with towels.  A Penrose drain was used as a tourniquet.  The skin at the radial side of the nail had been pushed back and purulence encountered.  The nail was removed with a freer elevator.  A small incision was made in the area of the purulent drainage.  The wound was copiously irrigated with sterile saline by bulb syringe.  A piece of Xeroform was then placed in the nail fold and a piece in the incised area.  The wound was dressed with sterile 4 x 4 and wrapped with a Coban dressing lightly.  AlumaFoam splint was placed and wrapped lightly with Coban dressing.  Digital block was performed with quarter percent plain Marcaine to aid in postoperative analgesia.  The patient was awoken from anesthesia safely.  She was transferred back to the stretcher and taken to PACU in stable  condition.  He is admitted to the hospitalist for IV antibiotics.  I will see her in 3 to 4 days for hydrotherapy and wound care.  Leanora Cover, MD Electronically signed, 02/06/22

## 2022-02-06 NOTE — Anesthesia Preprocedure Evaluation (Addendum)
Anesthesia Evaluation  Patient identified by MRN, date of birth, ID band Patient awake    Reviewed: Allergy & Precautions, H&P , NPO status , Patient's Chart, lab work & pertinent test results, Unable to perform ROS - Chart review only  History of Anesthesia Complications (+) DIFFICULT AIRWAY, Family history of anesthesia reaction and history of anesthetic complications  Airway Mallampati: III  TM Distance: >3 FB Neck ROM: Full    Dental no notable dental hx. (+) Teeth Intact, Dental Advisory Given   Pulmonary neg pulmonary ROS, pneumonia   Pulmonary exam normal breath sounds clear to auscultation       Cardiovascular negative cardio ROS Normal cardiovascular exam Rhythm:Regular Rate:Normal     Neuro/Psych  Headaches Down's syndrome  Neuromuscular disease negative neurological ROS  negative psych ROS   GI/Hepatic negative GI ROS, Neg liver ROS,GERD  ,,  Endo/Other  diabetes, Type 2Hypothyroidism  Morbid obesity  Renal/GU negative Renal ROS  negative genitourinary   Musculoskeletal negative musculoskeletal ROS (+) Arthritis ,  Fibromyalgia -  Abdominal   Peds negative pediatric ROS (+)  Hematology negative hematology ROS (+)   Anesthesia Other Findings   Reproductive/Obstetrics negative OB ROS                              Anesthesia Physical Anesthesia Plan  ASA: 3  Anesthesia Plan: General   Post-op Pain Management:  Regional for Post-op pain and Ofirmev IV (intra-op)*   Induction: Intravenous  PONV Risk Score and Plan: 2 and Treatment may vary due to age or medical condition, Ondansetron, Midazolam and Dexamethasone  Airway Management Planned: LMA  Additional Equipment:   Intra-op Plan:   Post-operative Plan: Extubation in OR  Informed Consent:   Plan Discussed with: Anesthesiologist and CRNA  Anesthesia Plan Comments:          Anesthesia Quick Evaluation

## 2022-02-06 NOTE — Anesthesia Procedure Notes (Signed)
Procedure Name: LMA Insertion Date/Time: 02/06/2022 2:02 PM  Performed by: Mosetta Pigeon, CRNAPre-anesthesia Checklist: Patient identified, Emergency Drugs available, Suction available and Patient being monitored Patient Re-evaluated:Patient Re-evaluated prior to induction Oxygen Delivery Method: Circle System Utilized Preoxygenation: Pre-oxygenation with 100% oxygen Induction Type: IV induction Ventilation: Mask ventilation without difficulty LMA: LMA inserted LMA Size: 4.0 Number of attempts: 1 Airway Equipment and Method: Bite block Placement Confirmation: positive ETCO2 Tube secured with: Tape Dental Injury: Teeth and Oropharynx as per pre-operative assessment

## 2022-02-06 NOTE — Consult Note (Addendum)
Reason for Consult:Right long finger infection Referring Physician: Fuller Plan Time called: 0254 Time at bedside: [place] is an 42 y.o. female.  HPI: Wanda Oneill developed an infection of her right long finger about 4d ago. She has a long hx/o paronychias but nothing like this. She denies fevers, chills, sweats, N/V. She has had dystrophic nails on both long fingers for about a year, unclear if related. She is RHD.  Past Medical History:  Diagnosis Date   Diabetes mellitus    type II   Down syndrome    Family history of adverse reaction to anesthesia    mom has had n/v   Gastritis    Headache    Hepatic steatosis    Hidradenitis    Hypothyroidism    Irritable bowel syndrome    Periumbilical hernia    Pneumonia 03/2020   frequent    Restless legs    Tendonitis    chronic in left foot   Thyroid disease    hypothyroidism    Past Surgical History:  Procedure Laterality Date   AXILLARY HIDRADENITIS EXCISION Bilateral    CHOLECYSTECTOMY     HERNIA REPAIR     multiple   INCISIONAL HERNIA REPAIR  05/30/2015   LAPROSCOPIC   INCISIONAL HERNIA REPAIR N/A 05/30/2015   Procedure: LAPAROSCOPIC INCISIONAL HERNIA;  Surgeon: Rolm Bookbinder, MD;  Location: [place];  Service: General;  Laterality: N/A;   INSERTION OF MESH N/A 05/30/2015   Procedure: INSERTION OF MESH;  Surgeon: Rolm Bookbinder, MD;  Location: [place];  Service: General;  Laterality: N/A;   KNEE ARTHROSCOPY     right knee   LAPAROSCOPY N/A 05/30/2015   Procedure: LAPAROSCOPY DIAGNOSTIC;  Surgeon: Rolm Bookbinder, MD;  Location: [place];  Service: General;  Laterality: N/A;   PARTIAL KNEE ARTHROPLASTY Right 10/30/2017   Procedure: UNICOMPARTMENTAL RIGHT KNEE LATERAL;  Surgeon: Paralee Cancel, MD;  Location: [facility];  Service: Orthopedics;  Laterality: Right;  90 mins   PARTIAL KNEE ARTHROPLASTY Left 06/29/2020   Procedure: UNICOMPARTMENTAL KNEE LATERALLY;  Surgeon: Paralee Cancel, MD;  Location: [facility];  Service:  Orthopedics;  Laterality: Left;  57 MINS   TONSILLECTOMY     adenoids    Family History  Problem Relation Age of Onset   Thyroid cancer Mother    Diabetes type II Mother    High Cholesterol Mother    Heart disease Mother    Diabetes type II Father    Hypertension Father    Stroke Father     Social History:  reports that she has never smoked. She has never used smokeless tobacco. She reports that she does not drink alcohol and does not use drugs.  Allergies:  Allergies  Allergen Reactions   Vancomycin Itching   Cefuroxime Axetil Hives   Sulfa Antibiotics Hives    Medications: I have reviewed the patient's current medications.  Results for orders placed or performed during the hospital encounter of 02/05/22 (from the past 48 hour(s))  CBG monitoring, ED     Status: Abnormal   Collection Time: 02/05/22 10:27 PM  Result Value Ref Range   Glucose-Capillary 281 (H) 70 - 99 mg/dL    Comment: Glucose reference range applies only to samples taken after fasting for at least 8 hours.  Comprehensive metabolic panel     Status: Abnormal   Collection Time: 02/05/22 10:45 PM  Result Value Ref Range   Sodium 134 (L) 135 - 145 mmol/L   Potassium 3.8 3.5 -  5.1 mmol/L   Chloride 101 98 - 111 mmol/L   CO2 21 (L) 22 - 32 mmol/L   Glucose, Bld 289 (H) 70 - 99 mg/dL    Comment: Glucose reference range applies only to samples taken after fasting for at least 8 hours.   BUN 12 6 - 20 mg/dL   Creatinine, Ser 0.77 0.44 - 1.00 mg/dL   Calcium 8.5 (L) 8.9 - 10.3 mg/dL   Total Protein 8.0 6.5 - 8.1 g/dL   Albumin 3.1 (L) 3.5 - 5.0 g/dL   AST 27 15 - 41 U/L   ALT 34 0 - 44 U/L   Alkaline Phosphatase 35 (L) 38 - 126 U/L   Total Bilirubin 0.4 0.3 - 1.2 mg/dL   GFR, Estimated >60 >60 mL/min    Comment: (NOTE) Calculated using the CKD-EPI Creatinine Equation (2021)    Anion gap 12 5 - 15    Comment: Performed at [facility], [address] [place], [place] [address]  CBC with  Differential     Status: Abnormal   Collection Time: 02/05/22 10:45 PM  Result Value Ref Range   WBC 13.5 (H) 4.0 - 10.5 K/uL   RBC 4.94 3.87 - 5.11 MIL/uL   Hemoglobin 13.1 12.0 - 15.0 g/dL   HCT 41.4 36.0 - 46.0 %   MCV 83.8 80.0 - 100.0 fL   MCH 26.5 26.0 - 34.0 pg   MCHC 31.6 30.0 - 36.0 g/dL   RDW 16.3 (H) 11.5 - 15.5 %   Platelets 403 (H) 150 - 400 K/uL   nRBC 0.1 0.0 - 0.2 %   Neutrophils Relative % 74 %   Neutro Abs 10.0 (H) 1.7 - 7.7 K/uL   Lymphocytes Relative 15 %   Lymphs Abs 2.0 0.7 - 4.0 K/uL   Monocytes Relative 8 %   Monocytes Absolute 1.0 0.1 - 1.0 K/uL   Eosinophils Relative 0 %   Eosinophils Absolute 0.1 0.0 - 0.5 K/uL   Basophils Relative 1 %   Basophils Absolute 0.1 0.0 - 0.1 K/uL   Immature Granulocytes 2 %   Abs Immature Granulocytes 0.33 (H) 0.00 - 0.07 K/uL    Comment: Performed at [facility], [address] [place], [place] [address]    DG Finger Middle Right  Result Date: 02/05/2022 CLINICAL DATA:  Pain and swelling in the distal third digit EXAM: RIGHT MIDDLE FINGER 2+V COMPARISON:  11/26/2020 FINDINGS: Transverse lucency is noted in the distal phalanx of the third digit consistent with prior fracture and nonunion. Multiple bony fragments are noted. Some erosive changes are seen suspicious for osteomyelitis. Considerable soft tissue swelling is noted. IMPRESSION: Changes in the third distal phalanx suggestive of prior fracture and nonunion although some rows of changes are seen consistent with osteomyelitis. Considerable soft tissue swelling is noted. Electronically Signed   By: Inez Catalina M.D.   On: 02/05/2022 22:40    Review of Systems  HENT:  Negative for ear discharge, ear pain, hearing loss and tinnitus.   Eyes:  Negative for photophobia and pain.  Respiratory:  Negative for cough and shortness of breath.   Cardiovascular:  Negative for chest pain.  Gastrointestinal:  Negative for abdominal pain, nausea and vomiting.   Genitourinary:  Negative for dysuria, flank pain, frequency and urgency.  Musculoskeletal:  Positive for arthralgias (Right long finger). Negative for back pain, myalgias and neck pain.  Neurological:  Negative for dizziness and headaches.  Hematological:  Does not  bruise/bleed easily.  Psychiatric/Behavioral:  The patient is not nervous/anxious.    Blood pressure 124/74, pulse 80, temperature 98.9 F (37.2 C), temperature source Oral, resp. rate 16, height '5\' 4"'$  (1.626 m), weight 111.1 kg, SpO2 99 %. Physical Exam Constitutional:      General: She is not in acute distress.    Appearance: She is well-developed. She is not diaphoretic.  HENT:     Head: Normocephalic and atraumatic.  Eyes:     General: No scleral icterus.       Right eye: No discharge.        Left eye: No discharge.     Conjunctiva/sclera: Conjunctivae normal.  Cardiovascular:     Rate and Rhythm: Normal rate and regular rhythm.  Pulmonary:     Effort: Pulmonary effort is normal. No respiratory distress.  Musculoskeletal:     Cervical back: Normal range of motion.     Comments: Right shoulder, elbow, wrist, digits- no skin wounds, long finger with fusiform edema, severe flexor TTP, no instability, no blocks to motion  Sens  Ax/R/M/U intact  Mot   Ax/ R/ PIN/ M/ AIN/ U intact  Rad 2+  Skin:    General: Skin is warm and dry.  Neurological:     Mental Status: She is alert.  Psychiatric:        Mood and Affect: Mood normal.        Behavior: Behavior normal.     Assessment/Plan: Right long finger infection -- Plan I&D today with Dr. Fredna Dow. Please keep NPO.    Lisette Abu, PA-C Orthopedic Surgery [phone] 02/06/2022, 12:34 PM   Addendum (02/06/22): Patient seen and examined.  Agree with above.  Has had worsening swelling and erythema right long finger over past 4-5 days.  No fevers or chills.  Present with parents.  Seen at [facility] and started IV Abx yesterday.  Also notes white area left index finger  consistent with early paronychia. Exam: intact sensation and capillary refill all digits.  Right long swollen and erythematous.  Tender to palpation volar and dorsal.  Fluctuance over distal phalanx.  Pain with motion.   Left index with whitened area on radial side at nail edge.  No erythema. XR: fragmentation distal phalanx right long finger A/P: right long finger infection possibly including dip and flexor sheath and possible early left index paronychia.  Recommend OR for incision and drainage right long finger and possible incision and drainage left index finger vs manipulation of skin at edge of nail.  Risks, benefits and alternatives of surgery were discussed including risks of blood loss, infection, damage to nerves/vessels/tendons/ligament/bone, failure of surgery, need for additional surgery, complication with wound healing, stiffness, need for repeat irrigation and debridement, amputation.  She voiced understanding of these risks and elected to proceed.

## 2022-02-06 NOTE — ED Notes (Signed)
Per Dr Dayna Barker, pt to transfer to pre-op area once OR ready.  Family at bedside, willing to take POV if carelink not available.

## 2022-02-06 NOTE — ED Notes (Signed)
Pt for ED to ED transfer to await surgical intervention, Dr Dayna Barker approved to transfer via [place] with IV in place.  Dr Roxanne Mins ED doc accepted.  Report to Santiago Glad, Therapist, sports charge.

## 2022-02-06 NOTE — Progress Notes (Signed)
Plan of Care Note for accepted transfer   Patient: GIGI ONSTAD MRN: [id number]   [place]: 02/05/2022  Facility requesting transfer: [facility] ED. Requesting Provider: Theodis Blaze, PA-EDP. Reason for transfer: Flexor tenosynovitis  Facility course: The patient is a 42 year old female with past medical history significant for Down syndrome, uncontrolled type 2 diabetes, severe morbid obesity, who presented to [facility] ED with complaints of pain and swelling involving the right middle finger.  No subjective fevers.  Presented to the ED, accompanied by her mother, for further evaluation.  In the ED, noted to have obvious right flexor tenosynovitis with Tmax of 99.3.  Lab studies notable for leukocytosis 13.5 K.  X-ray of right middle finger revealed the following:  Changes in the third distal phalanx suggestive of prior fracture and nonunion, although some rows of changes are seen consistent with osteomyelitis.  Considerable soft tissue swelling is noted.  EDP discussed the case with hand surgery, Dr. Fredna Dow, who recommended admission at [facility] to the hospitalist service.  Hand surgery will see in consultation.  Possible OR visit early this morning.  The patient was admitted at [facility] telemetry surgical unit as inpatient status.  Started on Zosyn and IV doxycycline empirically, no IV linezolid in the ED.   Plan of care: The patient is accepted for admission to Telemetry unit, at [facility] as inpatient status.  Author: Kayleen Memos, DO 02/06/2022  Check [email or web address] for on-call coverage.  Nursing staff, Please call [place] number on Amion as soon as patient's arrival, so appropriate admitting provider can evaluate the pt.

## 2022-02-06 NOTE — Discharge Instructions (Signed)

## 2022-02-06 NOTE — H&P (Signed)
History and Physical    Patient: Wanda Oneill IHK:[id number] DOB: [birth year] DOA: 02/05/2022 DOS: the patient was seen and examined on 02/06/2022 PCP: Copland, Gay Filler, MD  Patient coming from: Transfer from [facility]  Chief Complaint:  Chief Complaint  Patient presents with   Nail Problem   HPI: Wanda Oneill is a 42 y.o. female with medical history significant of DM type II, hypothyroidism, IBS, Down syndrome, morbid obesity who presents with complaints of pain and swelling of the distal third digit on her right hand.  It was reported that symptoms with redness and swelling approximately 4-5 days ago.  At baseline patient lives alone and has a medical power of attorney.  The patient's parents are at bedside and gives most of the history.  Denied having any recent fevers, chills, nausea, or vomiting.  It was reported that she had prior infections in the past for which her father would lance these and place her on antibiotics who is a family medicine provider.  However, the patient did not notify her parents until yesterday due to worsening pain and swelling.  The patient mother that the patient be provided a regular diet as she can control her sugars but then states that her last hemoglobin A1c was "9.something".  She had been seen in urgent care and had been sent to the ED for further evaluation.  Family incidentally notes that the patient had recently gotten over COVID-19 approximately 11 days ago.  She had not taking anything for symptoms and still has a residual cough.   In the emergency department patient was noted to have temperature of 99.3 F with tachypnea and all other vital signs maintained.  Labs significant for WBC 13.5, platelets 403, and glucose 289.  X-rays of the right middle changes in the third distal phalanx suggestive of prior fracture and nonunion although some changes noted to be consistent with osteomyelitis with considerable soft tissue swelling.  Dr. Fredna Dow of  hand surgery was notified and recommended admission for need of I&D  Review of Systems: As mentioned in the history of present illness. All other systems reviewed and are negative. Past Medical History:  Diagnosis Date   Diabetes mellitus    type II   Down syndrome    Family history of adverse reaction to anesthesia    mom has had n/v   Gastritis    Headache    Hepatic steatosis    Hidradenitis    Hypothyroidism    Irritable bowel syndrome    Periumbilical hernia    Pneumonia 03/2020   frequent    Restless legs    Tendonitis    chronic in left foot   Thyroid disease    hypothyroidism   Past Surgical History:  Procedure Laterality Date   AXILLARY HIDRADENITIS EXCISION Bilateral    CHOLECYSTECTOMY     HERNIA REPAIR     multiple   INCISIONAL HERNIA REPAIR  05/30/2015   LAPROSCOPIC   INCISIONAL HERNIA REPAIR N/A 05/30/2015   Procedure: LAPAROSCOPIC INCISIONAL HERNIA;  Surgeon: Rolm Bookbinder, MD;  Location: [place];  Service: General;  Laterality: N/A;   INSERTION OF MESH N/A 05/30/2015   Procedure: INSERTION OF MESH;  Surgeon: Rolm Bookbinder, MD;  Location: [place];  Service: General;  Laterality: N/A;   KNEE ARTHROSCOPY     right knee   LAPAROSCOPY N/A 05/30/2015   Procedure: LAPAROSCOPY DIAGNOSTIC;  Surgeon: Rolm Bookbinder, MD;  Location: [place];  Service: General;  Laterality: N/A;  PARTIAL KNEE ARTHROPLASTY Right 10/30/2017   Procedure: UNICOMPARTMENTAL RIGHT KNEE LATERAL;  Surgeon: Paralee Cancel, MD;  [facility];  Service: Orthopedics;  Laterality: Right;  90 mins   PARTIAL KNEE ARTHROPLASTY Left 06/29/2020   Procedure: UNICOMPARTMENTAL KNEE LATERALLY;  Surgeon: Paralee Cancel, MD;  [facility];  Service: Orthopedics;  Laterality: Left;  70 MINS   TONSILLECTOMY     adenoids   Social History:  reports that she has never smoked. She has never used smokeless tobacco. She reports that she does not drink alcohol and does not use drugs.  Allergies  Allergen  Reactions   Vancomycin Itching   Cefuroxime Axetil Hives   Sulfa Antibiotics Hives    Family History  Problem Relation Age of Onset   Thyroid cancer Mother    Diabetes type II Mother    High Cholesterol Mother    Heart disease Mother    Diabetes type II Father    Hypertension Father    Stroke Father     Prior to Admission medications   Medication Sig Start Date End Date Taking? Authorizing Provider  acetaminophen (TYLENOL) 325 MG tablet Take 650 mg by mouth every 6 (six) hours as needed for mild pain.     [provider]  allopurinol (ZYLOPRIM) 300 MG tablet TAKE 1 TABLET EVERY DAY 11/16/21   Copland, Gay Filler, MD  Blood Glucose Monitoring Suppl (BLOOD GLUCOSE METER KIT AND SUPPLIES) Dispense based on patient and insurance preference. To check blood sugars daily FOR ICD-9 250.00 Patient taking differently: 1 each by Other route See admin instructions. Dispense based on patient and insurance preference. To check blood sugars daily FOR ICD-9 250.00 08/31/13   Darlyne Russian, MD  cholestyramine (QUESTRAN) 4 g packet DISSOLVE AND TAKE 1 PACKET OF POWDER BY MOUTH TWICE DAILY 05/01/21   Copland, Gay Filler, MD  dicyclomine (BENTYL) 20 MG tablet Take 20 mg by mouth daily as needed for spasms.    [provider]  empagliflozin (JARDIANCE) 25 MG TABS tablet Take 1 tablet (25 mg total) by mouth daily. 07/10/21   Copland, Gay Filler, MD  fenofibrate micronized (LOFIBRA) 134 MG capsule Take 134 mg by mouth daily before breakfast.    [provider]  FLUCELVAX QUADRIVALENT 0.5 ML injection  11/23/21   [provider]  Glucose Blood (BLOOD GLUCOSE TEST STRIPS) STRP 1 each by Other route See admin instructions. Use to test blood sugar up to twice a day 08/13/19   Copland, Gay Filler, MD  glucose blood (FREESTYLE TEST STRIPS) test strip Use as directed to test blood sugar up to twice a day. Dx code E11.9 08/16/19   Copland, Gay Filler, MD  glucose blood (TRUE METRIX BLOOD  GLUCOSE TEST) test strip Use as instructed to check glucose up to 2 times daily. Dx Code E11.9 08/16/19   Copland, Gay Filler, MD  levothyroxine (SYNTHROID) 150 MCG tablet TAKE 1 TABLET EVERY DAY BEFORE BREAKFAST 08/10/21   Copland, Gay Filler, MD  metaxalone (SKELAXIN) 800 MG tablet Take 1 tablet (800 mg total) by mouth 3 (three) times daily as needed for muscle spasms. 12/11/21   Copland, Gay Filler, MD  metFORMIN (GLUCOPHAGE) 1000 MG tablet TAKE 1 TABLET TWICE DAILY 01/04/22   Copland, Gay Filler, MD  norethindrone (AYGESTIN) 5 MG tablet Take 1 tablet (5 mg total) by mouth in the morning and at bedtime. 09/25/21   Megan Salon, MD  nystatin (MYCOSTATIN/NYSTOP) powder Apply 1 application. topically 3 (three) times daily. 05/24/21  Copland, Gay Filler, MD  ONE TOUCH ULTRA TEST test strip USE TO CHECK BLOOD SUGAR ONCE DAILY (ICD CODE:25.00) Patient taking differently: 1 each by Other route daily. 05/08/13   Weber, Damaris Hippo, PA-C  tirzepatide [facility]) 7.5 MG/0.5ML Pen 7.56m 02/20/21   [provider]  TOUJEO SOLOSTAR 300 UNIT/ML Solostar Pen Inject into the skin. 10/31/21   [provider]  triazolam (HALCION) 0.25 MG tablet 1-2 tabs PO 2 hours before procedure or imaging.  Do not drive with this medication. 01/15/22   TSilverio Decamp MD  TRUEplus Lancets 30G MISC Use as directed to check glucose up to two times daily. Dx code E11.9 08/16/19   Copland,Gay Filler MD    Physical Exam: Vitals:   02/06/22 0416 02/06/22 0702 02/06/22 1307 02/06/22 1320  BP: 122/74 124/74  134/79  Pulse: 82 80  96  Resp: _0 Temp: 99 F (37.2 C) 98.9 F (37.2 C)  98 F (36.7 C)  TempSrc:  Oral Oral Oral  SpO2: 99% 99%  98%  Weight:      Height:       Constitutional: Middle-aged obese female currently in no acute distress Eyes: PERRL, lids and conjunctivae normal ENMT: Mucous membranes are moist.   Neck: normal, supple, no masses, no thyromegaly Respiratory: clear to auscultation  bilaterally, no wheezing, no crackles. Normal respiratory effort. No accessory muscle use.  Cardiovascular: Regular rate and rhythm, no murmurs / rubs / gallops. No extremity edema. 2+ pedal pulses. No carotid bruits.  Abdomen: no tenderness, no masses palpated. No hepatosplenomegaly. Bowel sounds positive.  Musculoskeletal: no clubbing / cyanosis.  Significant swelling and erythema of the right finger Skin: no rashes, lesions, ulcers. No induration Neurologic: CN 2-12 grossly intact.   Strength 5/5 in all 4.  Psychiatric: Normal judgment and insight. Alert and oriented x 3. Normal mood.   Data Reviewed:    Assessment and Plan:  Osteomyelitis of the right long finger right  Flexor sheath infection of the right long finger Acute.  Patient presents with 4-5 days of redness and swelling of her right middle finger.  X-rays concerning for possible osteomyelitis with considerable soft tissue swelling.  Dr. KFredna Dowhand surgery consulted and plan to take patient for I&D. -Admit to a telemetry -N.p.o. until after surgery -Hydrocodone as needed for pain -Continue empiric antibiotics of Zosyn and doxycycline IV (PO doxycycline  -Appreciate Hand surgery consultative services, we will follow-up for any further recommendations  Leukocytosis Acute.  WBC elevated at 13.5.  Suspect secondary to above. -Recheck CBC tomorrow morning  Uncontrolled Diabetes mellitus type 2 with hyperglycemia, with long-term use of insulin On admission glucose elevated up to 289.  Last available hemoglobin A1c was 8.5 back in 2022, but patient's mother states last hemoglobin A1c was 9 something.  Home regimen appears to include Jardiance 25 mg daily, metformin 1000 mg twice daily, and Mounjaro week, Lantus 30 units at night. Family requesting regular diet for the patient. -Hypoglycemic protocols  -Check hemoglobin A1c in a.m. -Hold metformin -Continue Jardiance -Pharmacy substitution of Semglee 10 units nightly due to the  patient being n.p.o. all day -CBGs before every meal with moderate SSI -Adjust regimen as needed -Discussed that uncontrolled diabetes is likely leading to recurrent infections, but placed orders for regular diet as requested  History of COVID-19 infection Family reported initially diagnosed 11 days ago.  Patient had nothing for treatment. -Mucinex as needed  Hypothyroidism -Continue levothyroxine -Check TSH  IBS -Continue Questran  Hypertriglyceridemia Last available lipid panel noted triglycerides of 385 back in 12/2020. -Continue pharmacy substitution of fenofibrate  Morbid obesity BMI 42.05 kg/m  DVT prophylaxis: Lovenox Advance Care Planning:   Code Status: Full Code   Consults: Hand surgery  Family Communication: Family updated at bedside  Severity of Illness: The appropriate patient status for this patient is INPATIENT. Inpatient status is judged to be reasonable and necessary in order to provide the required intensity of service to ensure the patient's safety. The patient's presenting symptoms, physical exam findings, and initial radiographic and laboratory data in the context of their chronic comorbidities is felt to place them at high risk for further clinical deterioration. Furthermore, it is not anticipated that the patient will be medically stable for discharge from the hospital within 2 midnights of admission.   * I certify that at the point of admission it is my clinical judgment that the patient will require inpatient hospital care spanning beyond 2 midnights from the point of admission due to high intensity of service, high risk for further deterioration and high frequency of surveillance required.*  Author: Rondell A Smith, MD 02/06/2022 2:19 PM  For on call review [email or web address].  

## 2022-02-06 NOTE — ED Notes (Addendum)
OR contacted.  They stated pt is scheduled for apporx. 1300.  They will call around 11:30.  Pt may change in short stay. Pt and family has been notified of this info

## 2022-02-06 NOTE — Transfer of Care (Signed)
Immediate Anesthesia Transfer of Care Note  Patient: Tresa Endo  Procedure(s) Performed: INCISION AND DRAINAGE RIGHT HAND (Right: Hand) INCISION AND DRAINAGE LEFT INDEX FINGER (Left: Index Finger)  Patient Location: PACU  Anesthesia Type:General  Level of Consciousness: drowsy and patient cooperative  Airway & Oxygen Therapy: Patient Spontanous Breathing and Patient connected to nasal cannula oxygen  Post-op Assessment: Report given to RN and Post -op Vital signs reviewed and stable  Post vital signs: stable  Last Vitals:  Vitals Value Taken Time  BP 123/84 02/06/22 1524  Temp    Pulse 99 02/06/22 1527  Resp 21 02/06/22 1527  SpO2 91 % 02/06/22 1527  Vitals shown include unvalidated device data.  Last Pain:  Vitals:   02/06/22 1320  TempSrc: Oral  PainSc: 5       Patients Stated Pain Goal: 0 (17/35/67 0141)  Complications: No notable events documented.

## 2022-02-07 ENCOUNTER — Encounter (HOSPITAL_COMMUNITY): Payer: Self-pay | Admitting: Orthopedic Surgery

## 2022-02-07 DIAGNOSIS — M6510 Other infective (teno)synovitis, unspecified site: Secondary | ICD-10-CM | POA: Diagnosis not present

## 2022-02-07 LAB — BASIC METABOLIC PANEL
Anion gap: 18 — ABNORMAL HIGH (ref 5–15)
BUN: 19 mg/dL (ref 6–20)
CO2: 15 mmol/L — ABNORMAL LOW (ref 22–32)
Calcium: 8.3 mg/dL — ABNORMAL LOW (ref 8.9–10.3)
Chloride: 103 mmol/L (ref 98–111)
Creatinine, Ser: 1.14 mg/dL — ABNORMAL HIGH (ref 0.44–1.00)
GFR, Estimated: >60 mL/min (ref >60)
Glucose, Bld: 247 mg/dL — ABNORMAL HIGH (ref 70–99)
Potassium: 4.4 mmol/L (ref 3.5–5.1)
Sodium: 136 mmol/L (ref 135–145)

## 2022-02-07 LAB — CBC
HCT: 41.9 % (ref 36.0–46.0)
Hemoglobin: 12.8 g/dL (ref 12.0–15.0)
MCH: 26.7 pg (ref 26.0–34.0)
MCHC: 30.5 g/dL (ref 30.0–36.0)
MCV: 87.3 fL (ref 80.0–100.0)
Platelets: 406 10*3/uL — ABNORMAL HIGH (ref 150–400)
RBC: 4.8 MIL/uL (ref 3.87–5.11)
RDW: 16.5 % — ABNORMAL HIGH (ref 11.5–15.5)
WBC: 14.3 10*3/uL — ABNORMAL HIGH (ref 4.0–10.5)
nRBC: 0 % (ref 0.0–0.2)

## 2022-02-07 LAB — HIV ANTIBODY (ROUTINE TESTING W REFLEX): HIV Screen 4th Generation wRfx: NONREACTIVE

## 2022-02-07 LAB — GLUCOSE, CAPILLARY: Glucose-Capillary: 266 mg/dL — ABNORMAL HIGH (ref 70–99)

## 2022-02-07 LAB — TSH: TSH: 1.179 u[IU]/mL (ref 0.350–4.500)

## 2022-02-07 MED ORDER — SODIUM CHLORIDE 0.9 % IV SOLN
8.0000 mg/kg | Freq: Every day | INTRAVENOUS | Status: DC
  Administered 2022-02-07 – 2022-02-09 (×2): 600 mg via INTRAVENOUS
  Filled 2022-02-07 (×4): qty 12

## 2022-02-07 MED ORDER — ENOXAPARIN SODIUM 60 MG/0.6ML IJ SOSY
55.0000 mg | PREFILLED_SYRINGE | INTRAMUSCULAR | Status: DC
  Administered 2022-02-07 – 2022-02-08 (×2): 55 mg via SUBCUTANEOUS
  Filled 2022-02-07 (×2): qty 0.6

## 2022-02-07 NOTE — Progress Notes (Signed)
PROGRESS NOTE  Wanda Oneill GTX:[id number] DOB: January 30, 1981 DOA: 02/05/2022 PCP: Darreld Mclean, MD   LOS: 1 day   Brief Narrative / Interim history: 42 year old F with history of DM 2, hypothyroidism, Down syndrome, who comes into the hospital with pain and swelling of the distal right third digit.  This has been going on for the past 4 to 5 days.  No systemic symptoms with fever, chills, nausea or vomiting.  She has been having prior infections in the past for which her father would lance and place for antibiotics.  Father is an MD.  Hand surgery was consulted on admission, she was taken to the OR and is status post I&D on 1/3.  Subjective / 24h Interval events: Well this morning, complains of mild pain at the level of surgery but controlled.  Assesement and Plan: Principal Problem:   Infection of flexor tendon sheath Active Problems:   Finger osteomyelitis, right (HCC)   Leukocytosis   Uncontrolled type 2 diabetes mellitus with hyperglycemia, with long-term current use of insulin (HCC)   History of COVID-19   Hypothyroid   IBS (irritable bowel syndrome)   Hypertriglyceridemia   Obesity, Class III, BMI 40-49.9 (morbid obesity) ([place])   Principal problem Right long finger flexor sheath infection, distal phalanx osteomyelitis, DIP joint infection - Dr. Fredna Dow consulted, appreciate input.  She was taken to the OR on 1/3 and is status post I&D.  Primary cultures shows GPC in pairs, await speciation/sensitivities, for now continue IV antibiotics.  Active problems DM2, with hyperglycemia-last A1c was in the 9 range.  Continue Jardiance, glargine  CBG (last 3)  Recent Labs    02/05/22 2227 02/06/22 1233 02/06/22 1527  GLUCAP 281* 147* 133*    History of COVID-19 infection -Family reported initially diagnosed 11 days ago.  Has a residual cough but for most part URI symptoms have improved  Hypothyroidism -Continue levothyroxine, TSH normal at 1.17   IBS -Continue Questran    Hypertriglyceridemia - Last available lipid panel noted triglycerides of 385 back in 12/2020. Continue pharmacy substitution of fenofibrate   Obesity, class III - BMI 42.05 kg/m  Scheduled Meds:  allopurinol  300 mg Oral Daily   cholestyramine  4 g Oral BID   empagliflozin  25 mg Oral Daily   enoxaparin (LOVENOX) injection  55 mg Subcutaneous Q24H   fenofibrate  160 mg Oral Daily   guaiFENesin  600 mg Oral BID   insulin glargine-yfgn  10 Units Subcutaneous QHS   levothyroxine  150 mcg Oral Q0600   norethindrone  5 mg Oral BID   sodium chloride flush  3 mL Intravenous Q12H   Continuous Infusions:  doxycycline (VIBRAMYCIN) IV 125 mL/hr at 02/07/22 1055   lactated ringers 75 mL/hr at 02/07/22 0714   piperacillin-tazobactam 3.375 g (02/07/22 0538)   PRN Meds:.acetaminophen **OR** acetaminophen, albuterol, HYDROcodone-acetaminophen, morphine injection, ondansetron **OR** ondansetron (ZOFRAN) IV  Current Outpatient Medications  Medication Instructions   acetaminophen (TYLENOL) 1,000 mg, Oral, Daily at bedtime   allopurinol (ZYLOPRIM) 300 mg, Oral, Daily   Blood Glucose Monitoring Suppl (BLOOD GLUCOSE METER KIT AND SUPPLIES) Dispense based on patient and insurance preference. To check blood sugars daily FOR ICD-9 250.00   cholestyramine (QUESTRAN) 4 g packet DISSOLVE AND TAKE 1 PACKET OF POWDER BY MOUTH TWICE DAILY   dicyclomine (BENTYL) 20 mg, Oral, Daily PRN   empagliflozin (JARDIANCE) 25 mg, Oral, Daily   fenofibrate micronized (LOFIBRA) 134 mg, Oral, Daily before breakfast   FLUCELVAX QUADRIVALENT 0.5 ML  injection    Glucose Blood (BLOOD GLUCOSE TEST STRIPS) STRP 1 each, Other, See admin instructions, Use to test blood sugar up to twice a day   glucose blood (FREESTYLE TEST STRIPS) test strip Use as directed to test blood sugar up to twice a day. Dx code E11.9   glucose blood (TRUE METRIX BLOOD GLUCOSE TEST) test strip Use as instructed to check glucose up to 2 times daily. Dx Code  E11.9   levothyroxine (SYNTHROID) 150 MCG tablet TAKE 1 TABLET EVERY DAY BEFORE BREAKFAST   metaxalone (SKELAXIN) 800 mg, Oral, 3 times daily PRN   metFORMIN (GLUCOPHAGE) 1,000 mg, Oral, 2 times daily   naproxen sodium (ALEVE) 440 mg, Oral, Daily   norethindrone (AYGESTIN) 5 mg, Oral, 2 times daily   nystatin (MYCOSTATIN/NYSTOP) powder 1 application , Topical, 3 times daily   ondansetron (ZOFRAN) 4 mg, Oral, Once PRN   ONE TOUCH ULTRA TEST test strip USE TO CHECK BLOOD SUGAR ONCE DAILY (ICD CODE:25.00)   tirzepatide (MOUNJARO) 7.5 MG/0.5ML Pen 7.4m   Toujeo SoloStar 30 Units, Subcutaneous, Every evening   triazolam (HALCION) 0.25 MG tablet 1-2 tabs PO 2 hours before procedure or imaging.  Do not drive with this medication.   TRUEplus Lancets 30G MISC Use as directed to check glucose up to two times daily. Dx code E11.9    Diet Orders (From admission, onward)     Start     Ordered   02/06/22 1552  Diet regular Fluid consistency: Thin  Diet effective now       Question:  Fluid consistency:  Answer:  Thin   02/06/22 1551            DVT prophylaxis: SCD's Start: 02/06/22 1706   Lab Results  Component Value Date   PLT 406 (H) 02/07/2022      Code Status: Full Code  Family Communication: mother, family friend Dr DEverlene Farrierat bedside   Status is: Inpatient  Remains inpatient appropriate because: Continue IV antibiotics until cultures speciate  Level of care: Telemetry Surgical  Consultants:  Hand surgery   Objective: Vitals:   02/06/22 2000 02/07/22 0022 02/07/22 0400 02/07/22 0701  BP: 120/68 (!) 111/57 133/85 123/65  Pulse: (!) 103 92 91 90  Resp: _0 Temp: 98.4 F (36.9 C) 98.2 F (36.8 C) 98.1 F (36.7 C) 98.9 F (37.2 C)  TempSrc: Oral Oral Oral Oral  SpO2: 94% 94% 94% 97%  Weight:      Height:        Intake/Output Summary (Last 24 hours) at 02/07/2022 1059 Last data filed at 02/07/2022 1055 Gross per 24 hour  Intake 1815 ml  Output --  Net 1815 ml    Wt Readings from Last 3 Encounters:  02/05/22 111.1 kg  01/09/22 105.2 kg  10/01/21 105.3 kg    Examination:  Constitutional: NAD Eyes: no scleral icterus ENMT: Mucous membranes are moist.  Neck: normal, supple Respiratory: clear to auscultation bilaterally, no wheezing, no crackles. Normal respiratory effort. No accessory muscle use.  Cardiovascular: Regular rate and rhythm, no murmurs / rubs / gallops.  Abdomen: non distended, no tenderness. Bowel sounds positive.  Musculoskeletal: no clubbing / cyanosis.   Data Reviewed: I have independently reviewed following labs and imaging studies   CBC Recent Labs  Lab 02/05/22 2245 02/07/22 0342  WBC 13.5* 14.3*  HGB 13.1 12.8  HCT 41.4 41.9  PLT 403* 406*  MCV 83.8 87.3  MCH 26.5 26.7  MCHC 31.6 30.5  RDW 16.3* 16.5*  LYMPHSABS 2.0  --   MONOABS 1.0  --   EOSABS 0.1  --   BASOSABS 0.1  --     Recent Labs  Lab 02/05/22 2245 02/07/22 0342  NA 134* 136  K 3.8 4.4  CL 101 103  CO2 21* 15*  GLUCOSE 289* 247*  BUN 12 19  CREATININE 0.77 1.14*  CALCIUM 8.5* 8.3*  AST 27  --   ALT 34  --   ALKPHOS 35*  --   BILITOT 0.4  --   ALBUMIN 3.1*  --   TSH  --  1.179    ------------------------------------------------------------------------------------------------------------------ No results for input(s): "CHOL", "HDL", "LDLCALC", "TRIG", "CHOLHDL", "LDLDIRECT" in the last 72 hours.  Lab Results  Component Value Date   HGBA1C 8.5 (H) 12/20/2020   ------------------------------------------------------------------------------------------------------------------ Recent Labs    02/07/22 0342  TSH 1.179    Cardiac Enzymes No results for input(s): "CKMB", "TROPONINI", "MYOGLOBIN" in the last 168 hours.  Invalid input(s): "CK" ------------------------------------------------------------------------------------------------------------------    Component Value Date/Time   BNP 243.0 (H) 05/26/2019 1754     CBG: Recent Labs  Lab 02/05/22 2227 02/06/22 1233 02/06/22 1527  GLUCAP 281* 147* 133*    Recent Results (from the past 240 hour(s))  Aerobic/Anaerobic Culture w Gram Stain (surgical/deep wound)     Status: None (Preliminary result)   Collection Time: 02/06/22  2:30 PM   Specimen: Wound  Result Value Ref Range Status   Specimen Description WOUND RIGHT FINGER  Final   Special Requests LONG FINGER SWAB  Final   Gram Stain   Final    FEW WBC PRESENT, PREDOMINANTLY PMN MODERATE GRAM POSITIVE COCCI IN PAIRS    Culture   Final    CULTURE REINCUBATED FOR BETTER GROWTH Performed at [facility], [place] [address] [place], [place] [address]    Report Status PENDING  Incomplete     Radiology Studies: No results found.   Marzetta Board, MD, PhD [facility]  Between 7 am - 7 pm I am available, please contact me via Amion (for emergencies) or Securechat (non urgent messages)  Between 7 pm - 7 am I am not available, please contact night coverage MD/APP via Amion

## 2022-02-07 NOTE — Anesthesia Postprocedure Evaluation (Signed)
Anesthesia Post Note  Patient: Wanda Oneill  Procedure(s) Performed: INCISION AND DRAINAGE RIGHT HAND (Right: Hand) INCISION AND DRAINAGE LEFT INDEX FINGER (Left: Index Finger)     Patient location during evaluation: PACU Anesthesia Type: General Level of consciousness: awake and alert Pain management: pain level controlled Vital Signs Assessment: post-procedure vital signs reviewed and stable Respiratory status: spontaneous breathing, nonlabored ventilation, respiratory function stable and patient connected to nasal cannula oxygen Cardiovascular status: blood pressure returned to baseline and stable Postop Assessment: no apparent nausea or vomiting Anesthetic complications: no   No notable events documented.  Last Vitals:  Vitals:   02/07/22 1506 02/07/22 2001  BP: 130/72 115/60  Pulse: 92 95  Resp:  20  Temp: 36.9 C 36.8 C  SpO2: 94% 96%    Last Pain:  Vitals:   02/07/22 2001  TempSrc: Oral  PainSc: 6    Pain Goal: Patients Stated Pain Goal: 0 (02/07/22 2001)                 Chianna Spirito

## 2022-02-07 NOTE — Progress Notes (Signed)
Pharmacy Antibiotic Note  Wanda Oneill is a 42 y.o. female admitted on 02/05/2022 with R finger sheath infection, distal phalanx osteomyelitis s/p I&D and removal of nail plate 1/3. Pharmacy has been consulted to transition zosyn and doxycycline to daptomycin per discussion with ID.  Plan: Begin daptomycin '8mg'$ /kg ('600mg'$ ) IV q24 hours (using ABW of 77kg) Discontinue doxycycline and zosyn F/u wound culture  Height: '5\' 4"'$  (162.6 cm) Weight: 111.1 kg (245 lb) IBW/kg (Calculated) : 54.7  Temp (24hrs), Avg:98.3 F (36.8 C), Min:98 F (36.7 C), Max:98.9 F (37.2 C)  Recent Labs  Lab 02/05/22 2245 02/07/22 0342  WBC 13.5* 14.3*  CREATININE 0.77 1.14*    Estimated Creatinine Clearance: 78.4 mL/min (A) (by C-G formula based on SCr of 1.14 mg/dL (H)).    Allergies  Allergen Reactions   Vancomycin Itching   Cefuroxime Axetil Hives   Sulfa Antibiotics Hives    Antimicrobials this admission: Zosyn 1/2 >> 1/4 Doxy 1/3 >> 1/4 Daptomycin 1/4 >>   Dose adjustments this admission:  Microbiology results: 1/3 Finger swab: abundant group B strep, few staph aureus  Thank you for allowing pharmacy to be a part of this patient's care.  Dimple Nanas, PharmD, BCPS 02/07/2022 3:34 PM

## 2022-02-08 ENCOUNTER — Inpatient Hospital Stay: Payer: Self-pay

## 2022-02-08 DIAGNOSIS — M6510 Other infective (teno)synovitis, unspecified site: Secondary | ICD-10-CM | POA: Diagnosis not present

## 2022-02-08 DIAGNOSIS — M8618 Other acute osteomyelitis, other site: Secondary | ICD-10-CM

## 2022-02-08 DIAGNOSIS — L0292 Furuncle, unspecified: Secondary | ICD-10-CM

## 2022-02-08 DIAGNOSIS — E1169 Type 2 diabetes mellitus with other specified complication: Secondary | ICD-10-CM | POA: Diagnosis not present

## 2022-02-08 DIAGNOSIS — K589 Irritable bowel syndrome without diarrhea: Secondary | ICD-10-CM | POA: Diagnosis not present

## 2022-02-08 LAB — HEMOGLOBIN A1C
Hgb A1c MFr Bld: 10.5 % — ABNORMAL HIGH (ref 4.8–5.6)
Mean Plasma Glucose: 255 mg/dL

## 2022-02-08 LAB — BASIC METABOLIC PANEL
Anion gap: 13 (ref 5–15)
BUN: 15 mg/dL (ref 6–20)
CO2: 20 mmol/L — ABNORMAL LOW (ref 22–32)
Calcium: 8.5 mg/dL — ABNORMAL LOW (ref 8.9–10.3)
Chloride: 102 mmol/L (ref 98–111)
Creatinine, Ser: 0.95 mg/dL (ref 0.44–1.00)
GFR, Estimated: >60 mL/min (ref >60)
Glucose, Bld: 181 mg/dL — ABNORMAL HIGH (ref 70–99)
Potassium: 3.9 mmol/L (ref 3.5–5.1)
Sodium: 135 mmol/L (ref 135–145)

## 2022-02-08 LAB — CBC
HCT: 37.4 % (ref 36.0–46.0)
Hemoglobin: 11.7 g/dL — ABNORMAL LOW (ref 12.0–15.0)
MCH: 26.5 pg (ref 26.0–34.0)
MCHC: 31.3 g/dL (ref 30.0–36.0)
MCV: 84.6 fL (ref 80.0–100.0)
Platelets: 406 10*3/uL — ABNORMAL HIGH (ref 150–400)
RBC: 4.42 MIL/uL (ref 3.87–5.11)
RDW: 16.1 % — ABNORMAL HIGH (ref 11.5–15.5)
WBC: 8.5 10*3/uL (ref 4.0–10.5)
nRBC: 0 % (ref 0.0–0.2)

## 2022-02-08 LAB — CK: Total CK: 37 U/L — ABNORMAL LOW (ref 38–234)

## 2022-02-08 MED ORDER — NAPROXEN 250 MG PO TABS
250.0000 mg | ORAL_TABLET | Freq: Two times a day (BID) | ORAL | Status: DC | PRN
  Administered 2022-02-09: 250 mg via ORAL
  Filled 2022-02-08: qty 1

## 2022-02-08 MED ORDER — GUAIFENESIN-DM 100-10 MG/5ML PO SYRP
5.0000 mL | ORAL_SOLUTION | ORAL | Status: DC | PRN
  Administered 2022-02-08 – 2022-02-09 (×2): 5 mL via ORAL
  Filled 2022-02-08 (×3): qty 10

## 2022-02-08 NOTE — Progress Notes (Signed)
Subjective: 2 Days Post-Op Procedure(s) (LRB): INCISION AND DRAINAGE RIGHT HAND (Right) INCISION AND DRAINAGE LEFT INDEX FINGER (Left) Patient reports pain worse in left index finger.  Overall controlled.  Objective: Vital signs in last 24 hours: Temp:  [98 F (36.7 C)-98.2 F (36.8 C)] 98 F (36.7 C) (01/05 0807) Pulse Rate:  [72-95] 87 (01/05 0807) Resp:  [17-20] 20 (01/05 0807) BP: (112-115)/(60-74) 115/74 (01/05 0807) SpO2:  [94 %-96 %] 94 % (01/05 0807)  Intake/Output from previous day: 01/04 0701 - 01/05 0700 In: 1312 [I.V.:1000; IV Piggyback:312] Out: -  Intake/Output this shift: Total I/O In: 240 [P.O.:240] Out: -   Recent Labs    02/05/22 2245 02/07/22 0342 02/08/22 0532  HGB 13.1 12.8 11.7*   Recent Labs    02/07/22 0342 02/08/22 0532  WBC 14.3* 8.5  RBC 4.80 4.42  HCT 41.9 37.4  PLT 406* 406*   Recent Labs    02/07/22 0342 02/08/22 0532  NA 136 135  K 4.4 3.9  CL 103 102  CO2 15* 20*  BUN 19 15  CREATININE 1.14* 0.95  GLUCOSE 247* 181*  CALCIUM 8.3* 8.5*   No results for input(s): "LABPT", "INR" in the last 72 hours.  Dressings clean/dry/intact   Assessment/Plan: 2 Days Post-Op Procedure(s) (LRB): INCISION AND DRAINAGE RIGHT HAND (Right) INCISION AND DRAINAGE LEFT INDEX FINGER (Left) Maintain dressings.  Antibiotics per ID.  Okay for D/C home from hand standpoint.  Follow up in office next week.   Leanora Cover 02/08/2022, 4:44 PM

## 2022-02-08 NOTE — Progress Notes (Incomplete)
PHARMACY CONSULT NOTE FOR:  OUTPATIENT  PARENTERAL ANTIBIOTIC THERAPY (OPAT)  Indication: R finger osteomyelitis  Regimen: Daptomycin 600 mg IV Q24H  End date: 03/20/22 (6w from OR date)   IV antibiotic discharge orders are pended. To discharging provider:  please sign these orders via discharge navigator,  Select New Orders & click on the button choice - Manage This Unsigned Work.     Thank you for allowing pharmacy to be a part of this patient's care.  Adria Dill, PharmD PGY-2 Infectious Diseases Resident  02/08/2022 10:21 AM

## 2022-02-08 NOTE — Progress Notes (Incomplete)
PHARMACY CONSULT NOTE FOR:  OUTPATIENT  PARENTERAL ANTIBIOTIC THERAPY (OPAT)  Indication:  Regimen:  End date:   IV antibiotic discharge orders are pended. To discharging provider:  please sign these orders via discharge navigator,  Select New Orders & click on the button choice - Manage This Unsigned Work.     Thank you for allowing pharmacy to be a part of this patient's care.  Lawson Radar 02/08/2022, 11:18 AM

## 2022-02-08 NOTE — Care Management Important Message (Signed)
Important Message  Patient Details  Name: Wanda Oneill MRN: [id number] Date of Birth: [birth year]   Medicare Important Message Given:  Yes     Hannah Beat 02/08/2022, 1:01 PM

## 2022-02-08 NOTE — Progress Notes (Signed)
Mobility Specialist Progress Note   02/08/22 1604  Mobility  Activity Ambulated with assistance in hallway  Level of Assistance Contact guard assist, steadying assist  Assistive Device Cane  Distance Ambulated (ft) 260 ft  Activity Response Tolerated well  $Mobility charge 1 Mobility   Received in bed having soreness in R hand but agreeable. No physical assist needed but CGA d/t sway like walk w/ cane. No faults during session returned back to bed w/ all needs mer.   Holland Falling Mobility Specialist Please contact via SecureChat or  Rehab office at [phone]

## 2022-02-08 NOTE — TOC Initial Note (Signed)
Transition of Care Wanda Oneill) - Initial/Assessment Note    Patient Details  Name: Wanda Oneill MRN: [id number] Date of Birth: 05/01/1980  Transition of Care [facility]) CM/SW Contact:    Wanda Meeker, RN Phone Number: 02/08/2022, 2:41 PM  Clinical Narrative:                 Patient is a 42 yr old female, admitted from home with infection of right flexor tendon sheath. S/p I & D of same on 02/06/22. Case Manager contacted patient's mother, Wanda Oneill [phone], to discuss patient's need for Home IV antibiotics and PICC line, also discussed home Health agencies. Patient's parent's deferred to CM to locate Home Health agency. Referral called to Wanda Oneill, [facility]    Expected Discharge Plan: Wanda Oneill Barriers to Discharge: Continued Medical Work up   Patient Goals and CMS Choice     Choice offered to / list presented to : Parent      Expected Discharge Plan and Services   Discharge Planning Services: CM Consult Post Acute Care Choice: Wanda Oneill arrangements for the past 2 months: [place]: RN   Date [place]: 02/08/22 Time HH Agency Contacted: [phone] Representative spoke with at Wanda Oneill: Wanda Oneill  Prior Living Arrangements/Services Living arrangements for the past 2 months: [place] Lives with:: Parents Patient language and need for interpreter reviewed:: Yes          Care giver support system in place?: Yes (comment)   Criminal Activity/Legal Involvement Pertinent to Current Situation/Hospitalization: No - Comment as needed  Activities of Daily Living Home Assistive Devices/Equipment: Cane (specify quad or straight) ADL Screening (condition at time of admission) Patient's cognitive ability adequate to safely complete daily activities?: Yes Is the patient deaf or have difficulty hearing?: No Does the patient have difficulty seeing, even when wearing  glasses/contacts?: No Does the patient have difficulty concentrating, remembering, or making decisions?: No Patient able to express need for assistance with ADLs?: Yes Does the patient have difficulty dressing or bathing?: Yes Independently performs ADLs?: No Communication: Needs assistance Is this a change from baseline?: Change from baseline, expected to last >3 days Dressing (OT): Needs assistance Is this a change from baseline?: Change from baseline, expected to last >3 days Grooming: Needs assistance Is this a change from baseline?: Change from baseline, expected to last >3 days Feeding: Needs assistance Is this a change from baseline?: Change from baseline, expected to last >3 days Bathing: Needs assistance Is this a change from baseline?: Change from baseline, expected to last >3 days Toileting: Needs assistance Is this a change from baseline?: Change from baseline, expected to last >3days In/Out Bed: Needs assistance Is this a change from baseline?: Change from baseline, expected to last <3 days Walks in Home: Needs assistance Is this a change from baseline?: Change from baseline, expected to last >3 days Does the patient have difficulty walking or climbing stairs?: No Weakness of Legs: None Weakness of Arms/Hands: Both  Permission Sought/Granted                  Emotional Assessment       Orientation: : Oriented to Self, Oriented to Place, Oriented to  Time (Patient has Down's Syndrome) Alcohol / Substance Use: Not Applicable Psych Involvement: No (comment)  Admission diagnosis:  Flexor tenosynovitis of finger [M65.9] Patient Active Problem List   Diagnosis Date Noted   Furuncles 02/08/2022   Infection of flexor tendon sheath 02/06/2022   Finger osteomyelitis, right (Wanda Oneill) 02/06/2022   Leukocytosis 02/06/2022   History of COVID-19 02/06/2022   IBS (irritable bowel syndrome) 02/06/2022   Hypertriglyceridemia 02/06/2022   Obesity, Class III, BMI 40-49.9  (morbid obesity) ([place]) 02/06/2022   Chronic right-sided low back pain 11/02/2021   Bilateral trigger thumb 06/18/2021   Chronic paronychia of finger 01/25/2021   S/P left unicompartmental knee replacement, lateral 06/29/2020   At risk for obstructive sleep apnea 06/11/2019   Cough 06/11/2019   Hyperglycemia 05/26/2019   Acute hypoxemic respiratory failure ([place]) 05/20/2019   Fibromyalgia 03/09/2019   Neck pain, chronic 03/09/2019   Acute shoulder bursitis, right 02/03/2019   Bile salt-induced diarrhea 09/25/2018   Arthritis of left acromioclavicular joint 09/23/2018   Precordial chest pain 08/11/2018   Morbid obesity ([place]) 10/31/2017   S/P right UKR, lateral 10/30/2017   Incarcerated epigastric hernia 05/30/2015   Left foot pain 08/09/2014   Chronic diarrhea 07/27/2014   Gout 08/10/2012   Acid reflux 01/22/2012   Down syndrome 11/11/2011   Hernia of abdominal wall 10/21/2011   Psoriasis 10/21/2011   Uncontrolled type 2 diabetes mellitus with hyperglycemia, with long-term current use of insulin ([place]) 04/12/2011   Hypothyroid 04/12/2011   Gait abnormality 06/22/2010   Tibial tendinitis, posterior 06/22/2010   PCP:  Copland, Gay Filler, MD Pharmacy:   [place], [address] [place] [address] Phone: [phone] Fax: [phone]  [place] [facility] [place], [place] [address] Phone: [phone] Fax: [phone]     Social Determinants of Health (SDOH) Social History: [place]: No Food Insecurity (02/06/2022)  Housing: Low Risk  (02/06/2022)  Transportation Needs: No Transportation Needs (02/06/2022)  Utilities: Not At Risk (02/06/2022)  Alcohol Screen: Low Risk  (10/25/2020)  Depression (PHQ2-9): Low Risk  (01/09/2022)  Financial Resource Strain: Low Risk  (01/09/2022)  Physical Activity: Insufficiently Active  (01/09/2022)  Social Connections: Moderately Integrated (01/09/2022)  Stress: No Stress Concern Present (01/09/2022)  Tobacco Use: Low Risk  (02/07/2022)   SDOH Interventions:     Readmission Risk Interventions    06/01/2019    1:56 PM 05/23/2019    2:04 PM  Readmission Risk Prevention Plan  Post Dischage Appt Complete Complete  Medication Screening Complete Complete  Transportation Screening Complete Complete

## 2022-02-08 NOTE — Consult Note (Signed)
[name]    Date of Admission:  02/05/2022     Reason for Consult: Osteomyelitis of right distal phalanx   Referring Provider: Dr. Marzetta Board    CC: Right finger pain  Lines:  Peripheral IV left hand 02/06/22  Abx: Total day 2 of antibiotics  Daptomycin Day 2        Assessment/Plan This is a 42 year old female with a past medical history of T2DM, hypothyroidism, down syndrome, IBS who presented to the emergency department for right finger pain. Patient admitted for further evaluation and management of right distal phalanx osteomyelitis.   #Right distal 3rd phalanx Osteomyelitis  #Infection of right flexor tendon sheath  ID consulted on this patient for further evaluation and management for antibiotics. This is a 42 year old female who states that she does not get infections often. There is documentation where patient occasionally does get some infections in her fingers in which her father helps lance and treat as he is a physician himself. Patient his not told her parents about this new wound so there is some uncertainty in how long it may have been there. When patient presented, patient was taken to the OR and had I&D with cultures and patient was started on antibiotics. Cultures currently growing Staph Aureus and GBS. Cultures have been re incubated for further growth. Long conversation held with parents and patient about the plan moving forward, and looks like they are pushing more for IV antibiotics for this treatment. Unfortunately sensitivities have not been back yet and so patient will need to remain on daptomycin for now, but plan will be to narrow as soon as sensitivite are back. In the mean time, patient can get PICC line while here.  Given patient picked at her fingers, this could have introduced bacteria. Parents were also concerned about patient have boils here and there for a while and I wonder if patient is colonized with Stpah  arueus. -Continue Daptomycin day 2  -Follow cultures -Monitor fever curve  -Plan for PICC line   #Hypothyroidism  #IBS #T2DM  #Hyperglyceridemia  -management per primary team  ------------------------------------------------ Principal Problem:   Infection of flexor tendon sheath Active Problems:   Uncontrolled type 2 diabetes mellitus with hyperglycemia, with long-term current use of insulin (HCC)   Hypothyroid   Finger osteomyelitis, right (HCC)   Leukocytosis   History of COVID-19   IBS (irritable bowel syndrome)   Hypertriglyceridemia   Obesity, Class III, BMI 40-49.9 (morbid obesity) ([place])    HPI: Wanda Oneill is a 42 y.o. female  with a past medical history of T2DM, hypothyroidism, down syndrome, IBS who presented to the emergency department for right finger pain. Patient states that this started about 4-5 days ago, but given the wound, I suspect it has been there longer. Parents state that they did not see anything on her hands prior. Patient does reports that she occasionally has infection on her fingers. Patient's father who is a physician is able to treat the small infections at home by draining and give some antibiotics. Per parents at bedside, patient gets boils under her abdomen from time to time as well. Patient has had no recent travels or any recent antibiotic use. Patient denies any fevers or chills. Patient does endorse to me that oral antibiotics do not make her stomach feel well and that if she could get IV antibiotics she would prefer that. I discussed plan with patient  at beside as well the parents who are all in agreement.    Family History  Problem Relation Age of Onset   Thyroid cancer Mother    Diabetes type II Mother    High Cholesterol Mother    Heart disease Mother    Diabetes type II Father    Hypertension Father    Stroke Father     Social History   Tobacco Use   Smoking status: Never   Smokeless tobacco: Never  Vaping Use   Vaping Use:  Never used  Substance Use Topics   Alcohol use: No    Alcohol/week: 0.0 standard drinks of alcohol   Drug use: No    Allergies  Allergen Reactions   Vancomycin Itching   Cefuroxime Axetil Hives   Sulfa Antibiotics Hives    Review of Systems: ROS All Other ROS was negative, except mentioned above   Past Medical History:  Diagnosis Date   Diabetes mellitus    type II   Down syndrome    Family history of adverse reaction to anesthesia    mom has had n/v   Gastritis    Headache    Hepatic steatosis    Hidradenitis    Hypothyroidism    Irritable bowel syndrome    Periumbilical hernia    Pneumonia 03/2020   frequent    Restless legs    Tendonitis    chronic in left foot   Thyroid disease    hypothyroidism       Scheduled Meds:  allopurinol  300 mg Oral Daily   cholestyramine  4 g Oral BID   empagliflozin  25 mg Oral Daily   enoxaparin (LOVENOX) injection  55 mg Subcutaneous Q24H   fenofibrate  160 mg Oral Daily   guaiFENesin  600 mg Oral BID   insulin glargine-yfgn  10 Units Subcutaneous QHS   levothyroxine  150 mcg Oral Q0600   norethindrone  5 mg Oral BID   sodium chloride flush  3 mL Intravenous Q12H   Continuous Infusions:  DAPTOmycin (CUBICIN) 600 mg in sodium chloride 0.9 % IVPB Stopped (02/07/22 2116)   lactated ringers 75 mL/hr at 02/08/22 0440   PRN Meds:.acetaminophen **OR** acetaminophen, albuterol, guaiFENesin-dextromethorphan, HYDROcodone-acetaminophen, morphine injection, naproxen, ondansetron **OR** ondansetron (ZOFRAN) IV   OBJECTIVE: Blood pressure 115/74, pulse 87, temperature 98 F (36.7 C), temperature source Oral, resp. rate 20, height '5\' 4"'$  (1.626 m), weight 111.1 kg, SpO2 94 %.  Physical Exam General: Pleasant female resting in bed in no acute distress Head: Normocephalic atraumatic  Eye: Non erythematous conjunctiva  Pulm: Normal work of breathing on room air Ext: Right hand wrapped in bandage with no signs of bleeding or  drainage through the bandage. Left index finger with ace bandage wrapped.   Lab Results Lab Results  Component Value Date   WBC 8.5 02/08/2022   HGB 11.7 (L) 02/08/2022   HCT 37.4 02/08/2022   MCV 84.6 02/08/2022   PLT 406 (H) 02/08/2022    Lab Results  Component Value Date   CREATININE 0.95 02/08/2022   BUN 15 02/08/2022   NA 135 02/08/2022   K 3.9 02/08/2022   CL 102 02/08/2022   CO2 20 (L) 02/08/2022    Lab Results  Component Value Date   ALT 34 02/05/2022   AST 27 02/05/2022   ALKPHOS 35 (L) 02/05/2022   BILITOT 0.4 02/05/2022      Microbiology: Recent Results (from the past 240 hour(s))  Aerobic/Anaerobic Culture w Gram Stain (surgical/deep  wound)     Status: None (Preliminary result)   Collection Time: 02/06/22  2:30 PM   Specimen: Wound  Result Value Ref Range Status   Specimen Description WOUND RIGHT FINGER  Final   Special Requests LONG FINGER SWAB  Final   Gram Stain   Final    FEW WBC PRESENT, PREDOMINANTLY PMN MODERATE GRAM POSITIVE COCCI IN PAIRS    Culture   Final    ABUNDANT GROUP B STREP(S.AGALACTIAE)ISOLATED TESTING AGAINST S. AGALACTIAE NOT ROUTINELY PERFORMED DUE TO PREDICTABILITY OF AMP/PEN/VAN SUSCEPTIBILITY. FEW STAPHYLOCOCCUS AUREUS SUSCEPTIBILITIES TO FOLLOW Performed at [facility], [place] [address], [place], [place] [address]    Report Status PENDING  Incomplete     Serology: N/A    Imaging: If present, new imagings (plain films, ct scans, and mri) have been personally visualized and interpreted; radiology reports have been reviewed. Decision making incorporated into the Impression / Recommendations.    Leigh Aurora, DO Internal medicine resident, PGY-1 [phone] pager    02/08/2022, 12:52 PM

## 2022-02-08 NOTE — Progress Notes (Signed)
PROGRESS NOTE  Wanda Oneill SWH:[id number] DOB: Oct 06, 1980 DOA: 02/05/2022 PCP: Darreld Mclean, MD   LOS: 2 days   Brief Narrative / Interim history: 42 year old F with history of DM 2, hypothyroidism, Down syndrome, who comes into the hospital with pain and swelling of the distal right third digit.  This has been going on for the past 4 to 5 days.  No systemic symptoms with fever, chills, nausea or vomiting.  She has been having prior infections in the past for which her father would lance and place for antibiotics.  Father is an MD.  Hand surgery was consulted on admission, she was taken to the OR and is status post I&D on 1/3.  Subjective / 24h Interval events: She is doing overall well.  Assesement and Plan: Principal Problem:   Infection of flexor tendon sheath Active Problems:   Finger osteomyelitis, right (HCC)   Leukocytosis   Uncontrolled type 2 diabetes mellitus with hyperglycemia, with long-term current use of insulin (HCC)   History of COVID-19   Hypothyroid   IBS (irritable bowel syndrome)   Hypertriglyceridemia   Obesity, Class III, BMI 40-49.9 (morbid obesity) ([place])   Principal problem Right long finger flexor sheath infection, distal phalanx osteomyelitis, DIP joint infection - Dr. Fredna Dow consulted, appreciate input.  She was taken to the OR on 1/3 and is status post I&D.  Primary cultures shows staph aureus as well as strep.  ID consulted, plan for prolonged IV antibiotics, PICC line placed.  Awaiting final speciation to determine home antibiotics, which is likely to result tomorrow  Active problems DM2, with hyperglycemia-last A1c was in the 9 range.  Continue Jardiance, glargine  CBG (last 3)  Recent Labs    02/06/22 1233 02/06/22 1527 02/07/22 2119  GLUCAP 147* 133* 266*     History of COVID-19 infection -Family reported initially diagnosed 11 days ago.  Has a residual cough but for most part URI symptoms have improved.  No new symptoms  today  Hypothyroidism -Continue levothyroxine, TSH normal at 1.17   IBS -Continue Questran   Hypertriglyceridemia - Last available lipid panel noted triglycerides of 385 back in 12/2020. Continue pharmacy substitution of fenofibrate   Obesity, class III - BMI 42.05 kg/m  Scheduled Meds:  allopurinol  300 mg Oral Daily   cholestyramine  4 g Oral BID   empagliflozin  25 mg Oral Daily   enoxaparin (LOVENOX) injection  55 mg Subcutaneous Q24H   fenofibrate  160 mg Oral Daily   guaiFENesin  600 mg Oral BID   insulin glargine-yfgn  10 Units Subcutaneous QHS   levothyroxine  150 mcg Oral Q0600   norethindrone  5 mg Oral BID   sodium chloride flush  3 mL Intravenous Q12H   Continuous Infusions:  DAPTOmycin (CUBICIN) 600 mg in sodium chloride 0.9 % IVPB Stopped (02/07/22 2116)   lactated ringers 75 mL/hr at 02/08/22 0440   PRN Meds:.acetaminophen **OR** acetaminophen, albuterol, guaiFENesin-dextromethorphan, HYDROcodone-acetaminophen, morphine injection, naproxen, ondansetron **OR** ondansetron (ZOFRAN) IV  Current Outpatient Medications  Medication Instructions   acetaminophen (TYLENOL) 1,000 mg, Oral, Daily at bedtime   allopurinol (ZYLOPRIM) 300 mg, Oral, Daily   Blood Glucose Monitoring Suppl (BLOOD GLUCOSE METER KIT AND SUPPLIES) Dispense based on patient and insurance preference. To check blood sugars daily FOR ICD-9 250.00   cholestyramine (QUESTRAN) 4 g packet DISSOLVE AND TAKE 1 PACKET OF POWDER BY MOUTH TWICE DAILY   dicyclomine (BENTYL) 20 mg, Oral, Daily PRN   empagliflozin (JARDIANCE) 25 mg, Oral,  Daily   fenofibrate micronized (LOFIBRA) 134 mg, Oral, Daily before breakfast   FLUCELVAX QUADRIVALENT 0.5 ML injection    Glucose Blood (BLOOD GLUCOSE TEST STRIPS) STRP 1 each, Other, See admin instructions, Use to test blood sugar up to twice a day   glucose blood (FREESTYLE TEST STRIPS) test strip Use as directed to test blood sugar up to twice a day. Dx code E11.9   glucose  blood (TRUE METRIX BLOOD GLUCOSE TEST) test strip Use as instructed to check glucose up to 2 times daily. Dx Code E11.9   levothyroxine (SYNTHROID) 150 MCG tablet TAKE 1 TABLET EVERY DAY BEFORE BREAKFAST   metaxalone (SKELAXIN) 800 mg, Oral, 3 times daily PRN   metFORMIN (GLUCOPHAGE) 1,000 mg, Oral, 2 times daily   naproxen sodium (ALEVE) 440 mg, Oral, Daily   norethindrone (AYGESTIN) 5 mg, Oral, 2 times daily   nystatin (MYCOSTATIN/NYSTOP) powder 1 application , Topical, 3 times daily   ondansetron (ZOFRAN) 4 mg, Oral, Once PRN   ONE TOUCH ULTRA TEST test strip USE TO CHECK BLOOD SUGAR ONCE DAILY (ICD CODE:25.00)   tirzepatide (MOUNJARO) 7.5 MG/0.5ML Pen 7.'5mg'$    Toujeo SoloStar 30 Units, Subcutaneous, Every evening   triazolam (HALCION) 0.25 MG tablet 1-2 tabs PO 2 hours before procedure or imaging.  Do not drive with this medication.   TRUEplus Lancets 30G MISC Use as directed to check glucose up to two times daily. Dx code E11.9    Diet Orders (From admission, onward)     Start     Ordered   02/06/22 1552  Diet regular Fluid consistency: Thin  Diet effective now       Question:  Fluid consistency:  Answer:  Thin   02/06/22 1551            DVT prophylaxis: SCD's Start: 02/06/22 1706   Lab Results  Component Value Date   PLT 406 (H) 02/08/2022      Code Status: Full Code  Family Communication: mother, family friend Dr Everlene Farrier at bedside   Status is: Inpatient  Remains inpatient appropriate because: Continue IV antibiotics until cultures speciate  Level of care: Telemetry Surgical  Consultants:  Hand surgery   Objective: Vitals:   02/07/22 1506 02/07/22 2001 02/08/22 0527 02/08/22 0807  BP: 130/72 115/60 112/63 115/74  Pulse: 92 95 72 87  Resp:  '20 17 20  '$ Temp: 98.4 F (36.9 C) 98.2 F (36.8 C) 98.2 F (36.8 C) 98 F (36.7 C)  TempSrc: Oral Oral Oral Oral  SpO2: 94% 96%  94%  Weight:      Height:        Intake/Output Summary (Last 24 hours) at 02/08/2022  1310 Last data filed at 02/08/2022 3825 Gross per 24 hour  Intake 487 ml  Output --  Net 487 ml    Wt Readings from Last 3 Encounters:  02/05/22 111.1 kg  01/09/22 105.2 kg  10/01/21 105.3 kg    Examination:  Constitutional: NAD Eyes: lids and conjunctivae normal, no scleral icterus ENMT: mmm Neck: normal, supple Respiratory: clear to auscultation bilaterally, no wheezing, no crackles. Normal respiratory effort.  Cardiovascular: Regular rate and rhythm, no murmurs / rubs / gallops.  Abdomen: soft, no distention, no tenderness. Bowel sounds positive.   Data Reviewed: I have independently reviewed following labs and imaging studies   CBC Recent Labs  Lab 02/05/22 2245 02/07/22 0342 02/08/22 0532  WBC 13.5* 14.3* 8.5  HGB 13.1 12.8 11.7*  HCT 41.4 41.9 37.4  PLT 403*  406* 406*  MCV 83.8 87.3 84.6  MCH 26.5 26.7 26.5  MCHC 31.6 30.5 31.3  RDW 16.3* 16.5* 16.1*  LYMPHSABS 2.0  --   --   MONOABS 1.0  --   --   EOSABS 0.1  --   --   BASOSABS 0.1  --   --      Recent Labs  Lab 02/05/22 2245 02/07/22 0342 02/07/22 0344 02/08/22 0532  NA 134* 136  --  135  K 3.8 4.4  --  3.9  CL 101 103  --  102  CO2 21* 15*  --  20*  GLUCOSE 289* 247*  --  181*  BUN 12 19  --  15  CREATININE 0.77 1.14*  --  0.95  CALCIUM 8.5* 8.3*  --  8.5*  AST 27  --   --   --   ALT 34  --   --   --   ALKPHOS 35*  --   --   --   BILITOT 0.4  --   --   --   ALBUMIN 3.1*  --   --   --   TSH  --  1.179  --   --   HGBA1C  --   --  10.5*  --      ------------------------------------------------------------------------------------------------------------------ No results for input(s): "CHOL", "HDL", "LDLCALC", "TRIG", "CHOLHDL", "LDLDIRECT" in the last 72 hours.  Lab Results  Component Value Date   HGBA1C 10.5 (H) 02/07/2022   ------------------------------------------------------------------------------------------------------------------ Recent Labs    02/07/22 0342  TSH 1.179      Cardiac Enzymes No results for input(s): "CKMB", "TROPONINI", "MYOGLOBIN" in the last 168 hours.  Invalid input(s): "CK" ------------------------------------------------------------------------------------------------------------------    Component Value Date/Time   BNP 243.0 (H) 05/26/2019 1754    CBG: Recent Labs  Lab 02/05/22 2227 02/06/22 1233 02/06/22 1527 02/07/22 2119  GLUCAP 281* 147* 133* 266*     Recent Results (from the past 240 hour(s))  Aerobic/Anaerobic Culture w Gram Stain (surgical/deep wound)     Status: None (Preliminary result)   Collection Time: 02/06/22  2:30 PM   Specimen: Wound  Result Value Ref Range Status   Specimen Description WOUND RIGHT FINGER  Final   Special Requests LONG FINGER SWAB  Final   Gram Stain   Final    FEW WBC PRESENT, PREDOMINANTLY PMN MODERATE GRAM POSITIVE COCCI IN PAIRS    Culture   Final    ABUNDANT GROUP B STREP(S.AGALACTIAE)ISOLATED TESTING AGAINST S. AGALACTIAE NOT ROUTINELY PERFORMED DUE TO PREDICTABILITY OF AMP/PEN/VAN SUSCEPTIBILITY. FEW STAPHYLOCOCCUS AUREUS SUSCEPTIBILITIES TO FOLLOW Performed at [facility], [place] [address], [place], [place] [address]    Report Status PENDING  Incomplete     Radiology Studies: [place] EKG SITE RITE  Result Date: 02/08/2022 If Site Rite image not attached, placement could not be confirmed due to current cardiac rhythm.  [place] EKG SITE RITE  Result Date: 02/08/2022 If Site Rite image not attached, placement could not be confirmed due to current cardiac rhythm.    Marzetta Board, MD, PhD [facility]  Between 7 am - 7 pm I am available, please contact me via Amion (for emergencies) or Securechat (non urgent messages)  Between 7 pm - 7 am I am not available, please contact night coverage MD/APP via Amion

## 2022-02-08 NOTE — Inpatient Diabetes Management (Addendum)
Inpatient Diabetes Program Recommendations  AACE/ADA: New Consensus Statement on Inpatient Glycemic Control (2015)  Target Ranges:  Prepandial:   less than 140 mg/dL      Peak postprandial:   less than 180 mg/dL (1-2 hours)      Critically ill patients:  140 - 180 mg/dL   Lab Results  Component Value Date   GLUCAP 266 (H) 02/07/2022   HGBA1C 10.5 (H) 02/07/2022    Review of Glycemic Control  Latest Reference Range & Units 02/07/22 21:19  Glucose-Capillary 70 - 99 mg/dL 266 (H)  (H): Data is abnormally high Diabetes history: Type 2 DM Outpatient Diabetes medications: Jardiance 25 mg QD, Metformin 1000 mg BID, Toujeo 30 units QD, Mounjaro 7.5 mg qwk? Current orders for Inpatient glycemic control: Semglee 10 units QHS, Jardiance 25 mg QD  Inpatient Diabetes Program Recommendations:    Consider increasing Semglee to 18 units QHS and adding Novolog 0-15 units TID & HS.   Addendum: Spoke with patient regarding outpatient diabetes management. Patient is followed by Dr Chalmers Cater, outpatient endocrinology and dose of Toujeo was just increased to 30 units QAM.  Reviewed patient's current A1c of 10.5%. Explained what a A1c is and what it measures. Also reviewed goal A1c with patient, importance of good glucose control @ home, and blood sugar goals. Reviewed patho of DM, role of pancreas, surivial skills, vascular changes, impact of covid and other infection with poor glycemic control, interventions and commorbidities.  Patient has a meter and testing supplies. Reports checking blood sugar every other day in the mornings (140-165's mg/dL). Has used [employer] 2 and is waiting on a prescription to start for [employer] 3. Provided Libre 3 sensor and education on application. Assisted in downloading application. Encouraged that if not using a sensor that patient needs to be checking more often. Reviewed frequency and when to call MD. Has an upcoming appointment with Dr Chalmers Cater. Reviewed importance of  eliminating sugary beverages. Educated on importance of CHO mindfulness, plate method and using the Freestyle to help guide dietary decisions.     Thanks, Bronson Curb, MSN, RNC-OB Diabetes Coordinator [phone] (8a-5p)

## 2022-02-08 NOTE — Progress Notes (Signed)
Pt verbalized that she would like to reschedule her synthroid closer to breakfast, and would appreciate if she could take it at 0730. Pt educated about schedule times however, she verbalized that she would like try to stick around her schedule meds as if she was at home.

## 2022-02-09 ENCOUNTER — Other Ambulatory Visit: Payer: Medicare HMO

## 2022-02-09 DIAGNOSIS — L0292 Furuncle, unspecified: Secondary | ICD-10-CM | POA: Diagnosis not present

## 2022-02-09 DIAGNOSIS — E1169 Type 2 diabetes mellitus with other specified complication: Secondary | ICD-10-CM | POA: Diagnosis not present

## 2022-02-09 DIAGNOSIS — M8618 Other acute osteomyelitis, other site: Secondary | ICD-10-CM | POA: Diagnosis not present

## 2022-02-09 DIAGNOSIS — M6510 Other infective (teno)synovitis, unspecified site: Secondary | ICD-10-CM | POA: Diagnosis not present

## 2022-02-09 MED ORDER — SODIUM CHLORIDE 0.9% FLUSH: 10.0000 mL | INTRAVENOUS | Status: DC | PRN

## 2022-02-09 MED ORDER — HEPARIN SOD (PORK) LOCK FLUSH 100 UNIT/ML IV SOLN
250.0000 [IU] | INTRAVENOUS | Status: AC | PRN
  Administered 2022-02-09: 250 [IU]

## 2022-02-09 MED ORDER — CEFAZOLIN IV (FOR PTA / DISCHARGE USE ONLY): 2.0000 g | Freq: Three times a day (TID) | INTRAVENOUS | 0 refills | Status: DC

## 2022-02-09 MED ORDER — CEFAZOLIN SODIUM-DEXTROSE 2-4 GM/100ML-% IV SOLN: 2.0000 g | Freq: Three times a day (TID) | INTRAVENOUS | Status: DC

## 2022-02-09 MED ORDER — CHLORHEXIDINE GLUCONATE CLOTH 2 % EX PADS: 6.0000 | MEDICATED_PAD | Freq: Every day | CUTANEOUS | Status: DC

## 2022-02-09 MED ORDER — CEFAZOLIN SODIUM-DEXTROSE 2-4 GM/100ML-% IV SOLN
2.0000 g | Freq: Once | INTRAVENOUS | Status: AC
  Administered 2022-02-09: 2 g via INTRAVENOUS
  Filled 2022-02-09: qty 100

## 2022-02-09 NOTE — Progress Notes (Signed)
Peripherally Inserted Central Catheter Placement  The IV Nurse has discussed with the patient and/or persons authorized to consent for the patient, the purpose of this procedure and the potential benefits and risks involved with this procedure.  The benefits include less needle sticks, lab draws from the catheter, and the patient may be discharged home with the catheter. Risks include, but not limited to, infection, bleeding, blood clot (thrombus formation), and puncture of an artery; nerve damage and irregular heartbeat and possibility to perform a PICC exchange if needed/ordered by physician.  Alternatives to this procedure were also discussed.  Bard Power PICC patient education guide, fact sheet on infection prevention and patient information card has been provided to patient /or left at bedside.  Mom signed consent per pt request due to bil hands with bulky dressings present making it difficult to hold pen.  PICC Placement Documentation  PICC Single Lumen 27/25/36 Left Cephalic 45 cm 0 cm (Active)  Indication for Insertion or Continuance of Line Home intravenous therapies (PICC only) 02/09/22 1345  Exposed Catheter (cm) 0 cm 02/09/22 1345  Site Assessment Clean, Dry, Intact 02/09/22 1345  Line Status Flushed;Saline locked;Blood return noted 02/09/22 1345  Dressing Type Transparent;Securing device 02/09/22 1345  Dressing Status Antimicrobial disc in place;Clean, Dry, Intact 02/09/22 1345  Safety Lock Not Applicable 64/40/34 7425  Line Care Tubing changed;Cap changed;Connections checked and tightened 02/09/22 1345  Line Adjustment (NICU/IV Team Only) No 02/09/22 1345  Dressing Intervention New dressing 02/09/22 1345  Dressing Change Due 02/16/22 02/09/22 1345       Rolena Infante 02/09/2022, 1:46 PM

## 2022-02-09 NOTE — TOC Transition Note (Signed)
Transition of Care [facility]) - CM/SW Discharge Note   Patient Details  Name: Wanda Oneill MRN: [id number] Date of Birth: 1980-11-16  [facility] CM/SW Contact:  Bartholomew Crews, RN Phone Number: [phone] 02/09/2022, 2:11 PM   Clinical Narrative:     Patient to transition home today. PICC line placed. ID confirmed IV antibiotic needed for home infusion. [facility] and [facility] notified. Family to provide transportation home. No further TOC needs identified at this time.   Final next level of care: [place] Barriers to Discharge: No Barriers Identified   Patient Goals and CMS Choice   Choice offered to / list presented to : Parent  Discharge Placement                         Discharge Plan and Services Additional resources added to the After Visit Summary for     Discharge Planning Services: CM Consult Post Acute Care Choice: Home Health                    HH Arranged: RN, IV Antibiotics HH Agency: [facility], [facility] Date [facility] Agency Contacted: 02/09/22 Time [place]: 1410 Representative spoke with at [employer]: Tommi Rumps with Elliot Gault with [employer]  Social Determinants of Health (SDOH) Interventions SDOH Screenings   Food Insecurity: No Food Insecurity (02/06/2022)  Housing: Low Risk  (02/06/2022)  Transportation Needs: No Transportation Needs (02/06/2022)  Utilities: Not At Risk (02/06/2022)  Alcohol Screen: Low Risk  (10/25/2020)  Depression (PHQ2-9): Low Risk  (01/09/2022)  Financial Resource Strain: Low Risk  (01/09/2022)  Physical Activity: Insufficiently Active (01/09/2022)  Social Connections: Moderately Integrated (01/09/2022)  Stress: No Stress Concern Present (01/09/2022)  Tobacco Use: Low Risk  (02/07/2022)     Readmission Risk Interventions    06/01/2019    1:56 PM 05/23/2019    2:04 PM  Readmission Risk Prevention Plan  Post Dischage Appt Complete Complete  Medication Screening Complete Complete   Transportation Screening Complete Complete

## 2022-02-09 NOTE — Discharge Summary (Signed)
Physician Discharge Summary  Wanda Oneill IHK:[id number] DOB: 11-10-1980 DOA: 02/05/2022  PCP: Darreld Mclean, MD  Admit date: 02/05/2022 Discharge date: 02/09/2022  Admitted From: home Disposition:  home  Recommendations for Outpatient Follow-up:  Follow up with PCP in 1-2 weeks  Home Health: none Equipment/Devices: none  Discharge Condition: stable CODE STATUS: Full code Diet Orders (From admission, onward)     Start     Ordered   02/06/22 1552  Diet regular Fluid consistency: Thin  Diet effective now       Question:  Fluid consistency:  Answer:  Thin   02/06/22 1551           HPI: Per admitting MD, Wanda Oneill is a 42 y.o. female with medical history significant of DM type II, hypothyroidism, IBS, Down syndrome, morbid obesity who presents with complaints of pain and swelling of the distal third digit on her right hand.  It was reported that symptoms with redness and swelling approximately 4-5 days ago.  At baseline patient lives alone and has a medical power of attorney.  The patient's parents are at bedside and gives most of the history.  Denied having any recent fevers, chills, nausea, or vomiting.  It was reported that she had prior infections in the past for which her father would lance these and place her on antibiotics who is a family medicine provider.  However, the patient did not notify her parents until yesterday due to worsening pain and swelling.  The patient mother that the patient be provided a regular diet as she can control her sugars but then states that her last hemoglobin A1c was "9.something".  She had been seen in urgent care and had been sent to the ED for further evaluation.  Family incidentally notes that the patient had recently gotten over COVID-19 approximately 11 days ago.  She had not taking anything for symptoms and still has a residual cough. In the emergency department patient was noted to have temperature of 99.3 F with tachypnea and all  other vital signs maintained.  Labs significant for WBC 13.5, platelets 403, and glucose 289.  X-rays of the right middle changes in the third distal phalanx suggestive of prior fracture and nonunion although some changes noted to be consistent with osteomyelitis with considerable soft tissue swelling.  Dr. Fredna Dow of hand surgery was notified and recommended admission for need of I&D  Hospital Course / Discharge diagnoses: Principal Problem:   Infection of flexor tendon sheath Active Problems:   Finger osteomyelitis, right ([place])   Leukocytosis   Uncontrolled type 2 diabetes mellitus with hyperglycemia, with long-term current use of insulin ([facility])   History of COVID-19   Hypothyroid   IBS (irritable bowel syndrome)   Hypertriglyceridemia   Obesity, Class III, BMI 40-49.9 (morbid obesity) ([place])   Furuncles   Principal problem Right long finger flexor sheath infection, distal phalanx osteomyelitis, DIP joint infection - Dr. Fredna Dow consulted, appreciate input.  She was taken to the OR on 1/3 and is status post I&D.  Intraoperative microbiology showed group B strep as well as MSSA.  ID consulted and followed patient while hospitalized.  They recommend 6 weeks of IV antibiotics with Ancef, PICC line was placed and she will be discharged home in stable condition.  Home health has been arranged and she will have follow-up with ID as an outpatient.   Active problems DM2, with hyperglycemia-last A1c was in the 9 range.  Continue Jardiance, glargine History of COVID-19 infection -  Family reported initially diagnosed 11 days ago.  Has a residual cough but for most part URI symptoms have improved.  No new symptoms today Hypothyroidism -Continue levothyroxine, TSH normal at 1.17 IBS -Continue Questran Hypertriglyceridemia - Last available lipid panel noted triglycerides of 385 back in 12/2020. Continue home medications Obesity, class III - BMI 42.05 kg/m  Sepsis ruled out   Discharge  Instructions  Discharge Instructions     [facility] pharmacist to adjust dose for Vancomycin, Aminoglycosides and other anti-infective therapies as requested by physician.   Complete by: As directed    [facility] to provide Cath Flo '2mg'$    Complete by: As directed    Administer for PICC line occlusion and as ordered by physician for other access device issues.   Anaphylaxis Kit: Provided to treat any anaphylactic reaction to the medication being provided to the patient if First Dose or when requested by physician   Complete by: As directed    Epinephrine '1mg'$ /ml vial / amp: Administer 0.'3mg'$  (0.22m) subcutaneously once for moderate to severe anaphylaxis, nurse to call physician and pharmacy when reaction occurs and call 911 if needed for immediate care   Diphenhydramine '50mg'$ /ml IV vial: Administer 25-'50mg'$  IV/IM PRN for first dose reaction, rash, itching, mild reaction, nurse to call physician and pharmacy when reaction occurs   Sodium Chloride 0.9% NS 5078mIV: Administer if needed for hypovolemic blood pressure drop or as ordered by physician after call to physician with anaphylactic reaction   Change dressing on IV access line weekly and PRN   Complete by: As directed    Flush IV access with Sodium Chloride 0.9% and Heparin 10 units/ml or 100 units/ml   Complete by: As directed    Home infusion instructions - [facility]   Complete by: As directed    Instructions: Flush IV access with Sodium Chloride 0.9% and Heparin 10units/ml or 100units/ml   Change dressing on IV access line: Weekly and PRN   Instructions Cath Flo '2mg'$ : Administer for PICC Line occlusion and as ordered by physician for other access device   [facility] pharmacist to adjust dose for: Vancomycin, Aminoglycosides and other anti-infective therapies as requested by physician   Method of administration may be changed at the discretion of home infusion pharmacist based upon assessment  of the patient and/or caregiver's ability to self-administer the medication ordered   Complete by: As directed       Allergies as of 02/09/2022       Reactions   Vancomycin Itching   Cefuroxime Axetil Hives   Sulfa Antibiotics Hives        Medication List     TAKE these medications    acetaminophen 500 MG tablet Commonly known as: TYLENOL Take 1,000 mg by mouth at bedtime.   allopurinol 300 MG tablet Commonly known as: ZYLOPRIM TAKE 1 TABLET EVERY DAY   blood glucose meter kit and supplies Dispense based on patient and insurance preference. To check blood sugars daily FOR ICD-9 250.00 What changed:  how much to take how to take this when to take this   ceFAZolin  IVPB Commonly known as: ANCEF Inject 2 g into the vein every 8 (eight) hours. Indication:  osteomyelitis First Dose: No Last Day of Therapy:  03/20/22 Labs - Once weekly:  CBC/D and BMP, Labs - Every other week:  ESR and CRP Method of administration: IV Push Method of administration may be changed at the discretion of home infusion pharmacist based upon  assessment of the patient and/or caregiver's ability to self-administer the medication ordered.   cholestyramine 4 g packet Commonly known as: QUESTRAN DISSOLVE AND TAKE 1 PACKET OF POWDER BY MOUTH TWICE DAILY   dicyclomine 20 MG tablet Commonly known as: BENTYL Take 20 mg by mouth daily as needed for spasms.   empagliflozin 25 MG Tabs tablet Commonly known as: JARDIANCE Take 1 tablet (25 mg total) by mouth daily.   fenofibrate micronized 134 MG capsule Commonly known as: LOFIBRA Take 134 mg by mouth daily before breakfast.   Flucelvax Quadrivalent 0.5 ML injection Generic drug: influenza vaccine   levothyroxine 150 MCG tablet Commonly known as: SYNTHROID TAKE 1 TABLET EVERY DAY BEFORE BREAKFAST What changed: See the new instructions.   metaxalone 800 MG tablet Commonly known as: SKELAXIN Take 1 tablet (800 mg total) by mouth 3 (three) times  daily as needed for muscle spasms.   metFORMIN 1000 MG tablet Commonly known as: GLUCOPHAGE TAKE 1 TABLET TWICE DAILY   Mounjaro 7.5 MG/0.5ML Pen Generic drug: tirzepatide 7.'5mg'$    naproxen sodium 220 MG tablet Commonly known as: ALEVE Take 440 mg by mouth daily.   norethindrone 5 MG tablet Commonly known as: AYGESTIN Take 1 tablet (5 mg total) by mouth in the morning and at bedtime.   nystatin powder Commonly known as: MYCOSTATIN/NYSTOP Apply 1 application. topically 3 (three) times daily.   ondansetron 4 MG tablet Commonly known as: ZOFRAN Take 4 mg by mouth once as needed for nausea or vomiting.   ONE TOUCH ULTRA TEST test strip Generic drug: glucose blood USE TO CHECK BLOOD SUGAR ONCE DAILY (ICD CODE:25.00) What changed: See the new instructions.   BLOOD GLUCOSE TEST STRIPS Strp 1 each by Other route See admin instructions. Use to test blood sugar up to twice a day What changed: Another medication with the same name was changed. Make sure you understand how and when to take each.   FREESTYLE TEST STRIPS test strip Generic drug: glucose blood Use as directed to test blood sugar up to twice a day. Dx code E11.9 What changed: Another medication with the same name was changed. Make sure you understand how and when to take each.   True Metrix Blood Glucose Test test strip Generic drug: glucose blood Use as instructed to check glucose up to 2 times daily. Dx Code E11.9 What changed: Another medication with the same name was changed. Make sure you understand how and when to take each.   Toujeo SoloStar 300 UNIT/ML Solostar Pen Generic drug: insulin glargine (1 Unit Dial) Inject 30 Units into the skin every evening.   triazolam 0.25 MG tablet Commonly known as: HALCION 1-2 tabs PO 2 hours before procedure or imaging.  Do not drive with this medication.   TRUEplus Lancets 30G Misc Use as directed to check glucose up to two times daily. Dx code E11.9                Discharge Care Instructions  (From admission, onward)           Start     Ordered   02/09/22 0000  Change dressing on IV access line weekly and PRN  (Home infusion instructions - [facility] )        02/09/22 1300            Follow-up Information     Leanora Cover, MD. Call.   Specialty: Orthopedic Surgery Contact information: [address] [place] [address] [phone]  Care, [facility] Follow up.   Specialty: Home Health Services Why: Someone from [facility] will contact you to arrange start date and time for your therapy. Contact information: [address]  [place] [address] [phone]         [employer]. Call.   Why: for questions about IV antibiotics  [phone]                Consultations: Hand surgery ID  Procedures/Studies:  [place] EKG SITE RITE  Result Date: 02/08/2022 If Site Rite image not attached, placement could not be confirmed due to current cardiac rhythm.  [place] EKG SITE RITE  Result Date: 02/08/2022 If Site Rite image not attached, placement could not be confirmed due to current cardiac rhythm.  DG Finger Middle Right  Result Date: 02/05/2022 CLINICAL DATA:  Pain and swelling in the distal third digit EXAM: RIGHT MIDDLE FINGER 2+V COMPARISON:  11/26/2020 FINDINGS: Transverse lucency is noted in the distal phalanx of the third digit consistent with prior fracture and nonunion. Multiple bony fragments are noted. Some erosive changes are seen suspicious for osteomyelitis. Considerable soft tissue swelling is noted. IMPRESSION: Changes in the third distal phalanx suggestive of prior fracture and nonunion although some rows of changes are seen consistent with osteomyelitis. Considerable soft tissue swelling is noted. Electronically Signed   By: Inez Catalina M.D.   On: 02/05/2022 22:40     Subjective: - no chest pain, shortness of breath, no abdominal pain, nausea or  vomiting.   Discharge Exam: BP 123/73   Pulse 88   Temp 98.3 F (36.8 C) (Oral)   Resp 17   Ht '5\' 4"'$  (1.626 m)   Wt 111.1 kg   SpO2 95%   BMI 42.05 kg/m   General: Pt is alert, awake, not in acute distress Cardiovascular: RRR, S1/S2 +, no rubs, no gallops Respiratory: CTA bilaterally, no wheezing, no rhonchi Abdominal: Soft, NT, ND, bowel sounds + Extremities: no edema, no cyanosis    The results of significant diagnostics from this hospitalization (including imaging, microbiology, ancillary and laboratory) are listed below for reference.     Microbiology: Recent Results (from the past 240 hour(s))  Aerobic/Anaerobic Culture w Gram Stain (surgical/deep wound)     Status: None (Preliminary result)   Collection Time: 02/06/22  2:30 PM   Specimen: Wound  Result Value Ref Range Status   Specimen Description WOUND RIGHT FINGER  Final   Special Requests LONG FINGER SWAB  Final   Gram Stain   Final    FEW WBC PRESENT, PREDOMINANTLY PMN MODERATE GRAM POSITIVE COCCI IN PAIRS    Culture   Final    ABUNDANT GROUP B STREP(S.AGALACTIAE)ISOLATED TESTING AGAINST S. AGALACTIAE NOT ROUTINELY PERFORMED DUE TO PREDICTABILITY OF AMP/PEN/VAN SUSCEPTIBILITY. FEW STAPHYLOCOCCUS AUREUS HOLDING FOR POSSIBLE ANAEROBE Performed at [facility], [place] [address] [place], [place] [address]    Report Status PENDING  Incomplete   Organism ID, Bacteria STAPHYLOCOCCUS AUREUS  Final      Susceptibility   Staphylococcus aureus - MIC*    CIPROFLOXACIN <=0.5 SENSITIVE Sensitive     ERYTHROMYCIN <=0.25 SENSITIVE Sensitive     GENTAMICIN <=0.5 SENSITIVE Sensitive     OXACILLIN <=0.25 SENSITIVE Sensitive     TETRACYCLINE <=1 SENSITIVE Sensitive     VANCOMYCIN 1 SENSITIVE Sensitive     TRIMETH/SULFA <=10 SENSITIVE Sensitive     CLINDAMYCIN <=0.25 SENSITIVE Sensitive     RIFAMPIN <=0.5 SENSITIVE Sensitive  Inducible Clindamycin NEGATIVE Sensitive     * FEW STAPHYLOCOCCUS AUREUS      Labs: Basic Metabolic Panel: Recent Labs  Lab 02/05/22 2245 02/07/22 0342 02/08/22 0532  NA 134* 136 135  K 3.8 4.4 3.9  CL 101 103 102  CO2 21* 15* 20*  GLUCOSE 289* 247* 181*  BUN '12 19 15  '$ CREATININE 0.77 1.14* 0.95  CALCIUM 8.5* 8.3* 8.5*   Liver Function Tests: Recent Labs  Lab 02/05/22 2245  AST 27  ALT 34  ALKPHOS 35*  BILITOT 0.4  PROT 8.0  ALBUMIN 3.1*   CBC: Recent Labs  Lab 02/05/22 2245 02/07/22 0342 02/08/22 0532  WBC 13.5* 14.3* 8.5  NEUTROABS 10.0*  --   --   HGB 13.1 12.8 11.7*  HCT 41.4 41.9 37.4  MCV 83.8 87.3 84.6  PLT 403* 406* 406*   CBG: Recent Labs  Lab 02/05/22 2227 02/06/22 1233 02/06/22 1527 02/07/22 2119  GLUCAP 281* 147* 133* 266*   Hgb A1c Recent Labs    02/07/22 0344  HGBA1C 10.5*   Lipid Profile No results for input(s): "CHOL", "HDL", "LDLCALC", "TRIG", "CHOLHDL", "LDLDIRECT" in the last 72 hours. Thyroid function studies Recent Labs    02/07/22 0342  TSH 1.179   Urinalysis    Component Value Date/Time   COLORURINE YELLOW 10/14/2007 1130   APPEARANCEUR CLEAR 10/14/2007 1130   LABSPEC 1.012 10/14/2007 1130   PHURINE 7.0 10/14/2007 1130   GLUCOSEU NEGATIVE 10/14/2007 1130   HGBUR NEGATIVE 10/14/2007 1130   BILIRUBINUR negative 05/03/2020 1622   BILIRUBINUR 5 12/21/2012 1231   KETONESUR negative 05/03/2020 1622   KETONESUR NEGATIVE 10/14/2007 1130   PROTEINUR negative 05/03/2020 1622   PROTEINUR 30 12/21/2012 1231   PROTEINUR NEGATIVE 10/14/2007 1130   UROBILINOGEN 0.2 05/03/2020 1622   UROBILINOGEN 0.2 10/14/2007 1130   NITRITE Negative 05/03/2020 1622   NITRITE neg 12/21/2012 1231   NITRITE NEGATIVE 10/14/2007 1130   LEUKOCYTESUR Negative 05/03/2020 1622    FURTHER DISCHARGE INSTRUCTIONS:   Get Medicines reviewed and adjusted: Please take all your medications with you for your next visit with your Primary MD   Laboratory/radiological data: Please request your Primary MD to go over all  hospital tests and procedure/radiological results at the follow up, please ask your Primary MD to get all Hospital records sent to his/her office.   In some cases, they will be blood work, cultures and biopsy results pending at the time of your discharge. Please request that your primary care M.D. goes through all the records of your hospital data and follows up on these results.   Also Note the following: If you experience worsening of your admission symptoms, develop shortness of breath, life threatening emergency, suicidal or homicidal thoughts you must seek medical attention immediately by calling 911 or calling your MD immediately  if symptoms less severe.   You must read complete instructions/literature along with all the possible adverse reactions/side effects for all the Medicines you take and that have been prescribed to you. Take any new Medicines after you have completely understood and accpet all the possible adverse reactions/side effects.    Do not drive when taking Pain medications or sleeping medications (Benzodaizepines)   Do not take more than prescribed Pain, Sleep and Anxiety Medications. It is not advisable to combine anxiety,sleep and pain medications without talking with your primary care practitioner   Special Instructions: If you have smoked or chewed Tobacco  in the last 2 yrs please stop smoking, stop any  regular Alcohol  and or any Recreational drug use.   Wear Seat belts while driving.   Please note: You were cared for by a hospitalist during your hospital stay. Once you are discharged, your primary care physician will handle any further medical issues. Please note that NO REFILLS for any discharge medications will be authorized once you are discharged, as it is imperative that you return to your primary care physician (or establish a relationship with a primary care physician if you do not have one) for your post hospital discharge needs so that they can reassess your  need for medications and monitor your lab values.  Time coordinating discharge: 40 minutes  SIGNED:  Marzetta Board, MD, PhD 02/09/2022, 1:44 PM

## 2022-02-09 NOTE — Progress Notes (Signed)
Patient ID: Wanda Oneill, female   DOB: 1980/08/21, 42 y.o.   MRN: [id number]  PICC team paged.  Haydee Salter, RN

## 2022-02-09 NOTE — Progress Notes (Signed)
Mobility Specialist Progress Note   02/09/22 0900  Mobility  Activity Ambulated with assistance in hallway  Level of Assistance Contact guard assist, steadying assist  Assistive Device Cane  Distance Ambulated (ft) 240 ft  Range of Motion/Exercises Active;All extremities  Activity Response Tolerated well   Patient received in supine and agreeable to participate in mobility. Ambulated min gaurd with steady gait. Returned to room without complaint or incident. Was left in supine with all needs met, call bell in reach.   [place] Belen Zwahlen, BS EXP Mobility Specialist Please contact via SecureChat or Rehab office at [phone]

## 2022-02-09 NOTE — Progress Notes (Signed)
Subjective: No new complaints   Antibiotics:  Anti-infectives (From admission, onward)    Start     Dose/Rate Route Frequency Ordered Stop   02/09/22 1345  ceFAZolin (ANCEF) IVPB 2g/100 mL premix        2 g 200 mL/hr over 30 Minutes Intravenous  Once 02/09/22 1251 02/09/22 1434   02/09/22 0000  ceFAZolin (ANCEF) IVPB        2 g Intravenous Every 8 hours 02/09/22 1300 03/20/22 2359   02/07/22 2000  DAPTOmycin (CUBICIN) 600 mg in sodium chloride 0.9 % IVPB  Status:  Discontinued        8 mg/kg  77.3 kg (Adjusted) 124 mL/hr over 30 Minutes Intravenous Daily 02/07/22 1542 02/09/22 1258   02/06/22 1400  piperacillin-tazobactam (ZOSYN) IVPB 3.375 g  Status:  Discontinued        3.375 g 12.5 mL/hr over 4 Hours Intravenous Every 8 hours 02/06/22 1155 02/07/22 1536   02/06/22 1200  doxycycline (VIBRAMYCIN) 100 mg in sodium chloride 0.9 % 250 mL IVPB  Status:  Discontinued        100 mg 125 mL/hr over 120 Minutes Intravenous Every 12 hours 02/06/22 1155 02/07/22 1536   02/05/22 2300  piperacillin-tazobactam (ZOSYN) IVPB 3.375 g        3.375 g 100 mL/hr over 30 Minutes Intravenous  Once 02/05/22 2248 02/06/22 0022   02/05/22 2300  doxycycline (VIBRAMYCIN) 100 mg in sodium chloride 0.9 % 250 mL IVPB        100 mg 125 mL/hr over 120 Minutes Intravenous  Once 02/05/22 2248 02/06/22 0323       Medications: Scheduled Meds:  allopurinol  300 mg Oral Daily   Chlorhexidine Gluconate Cloth  6 each Topical Daily   cholestyramine  4 g Oral BID   empagliflozin  25 mg Oral Daily   enoxaparin (LOVENOX) injection  55 mg Subcutaneous Q24H   fenofibrate  160 mg Oral Daily   guaiFENesin  600 mg Oral BID   insulin glargine-yfgn  10 Units Subcutaneous QHS   levothyroxine  150 mcg Oral Q0600   norethindrone  5 mg Oral BID   sodium chloride flush  3 mL Intravenous Q12H   Continuous Infusions:  lactated ringers 75 mL/hr at 02/09/22 0410   PRN Meds:.acetaminophen **OR** acetaminophen,  albuterol, guaiFENesin-dextromethorphan, heparin lock flush, HYDROcodone-acetaminophen, morphine injection, naproxen, ondansetron **OR** ondansetron (ZOFRAN) IV, sodium chloride flush    Objective: Weight change:   Intake/Output Summary (Last 24 hours) at 02/09/2022 1531 Last data filed at 02/09/2022 0410 Gross per 24 hour  Intake 0 ml  Output --  Net 0 ml   Blood pressure 123/73, pulse 88, temperature 98.3 F (36.8 C), temperature source Oral, resp. rate 17, height '5\' 4"'$  (1.626 m), weight 111.1 kg, SpO2 95 %. Temp:  [98.3 F (36.8 C)] 98.3 F (36.8 C) (01/06 0937) Pulse Rate:  [79-88] 88 (01/06 0937) Resp:  [17] 17 (01/06 0800) BP: (123)/(73) 123/73 (01/06 0937) SpO2:  [95 %-97 %] 95 % (01/06 0937)  Physical Exam: Physical Exam Constitutional:      General: She is not in acute distress.    Appearance: She is well-developed. She is not diaphoretic.  HENT:     Head: Normocephalic and atraumatic.     Right Ear: External ear normal.     Left Ear: External ear normal.     Mouth/Throat:     Pharynx: No oropharyngeal exudate.  Eyes:     General:  No scleral icterus.    Conjunctiva/sclera: Conjunctivae normal.     Pupils: Pupils are equal, round, and reactive to light.  Cardiovascular:     Rate and Rhythm: Normal rate and regular rhythm.     Heart sounds: Normal heart sounds. No murmur heard.    No friction rub. No gallop.  Pulmonary:     Effort: Pulmonary effort is normal. No respiratory distress.     Breath sounds: Normal breath sounds. No wheezing or rales.  Abdominal:     General: Bowel sounds are normal. There is no distension.     Palpations: Abdomen is soft.     Tenderness: There is no abdominal tenderness. There is no rebound.  Lymphadenopathy:     Cervical: No cervical adenopathy.  Skin:    General: Skin is warm and dry.     Coloration: Skin is not pale.     Findings: No erythema or rash.  Neurological:     General: No focal deficit present.     Mental Status:  She is alert and oriented to person, place, and time.     Motor: No abnormal muscle tone.     Coordination: Coordination normal.  Psychiatric:        Mood and Affect: Mood normal.        Behavior: Behavior normal.        Thought Content: Thought content normal.        Judgment: Judgment normal.     Fingers are bandaged bilaterally CBC:    BMET Recent Labs    02/07/22 0342 02/08/22 0532  NA 136 135  K 4.4 3.9  CL 103 102  CO2 15* 20*  GLUCOSE 247* 181*  BUN 19 15  CREATININE 1.14* 0.95  CALCIUM 8.3* 8.5*     Liver Panel  No results for input(s): "PROT", "ALBUMIN", "AST", "ALT", "ALKPHOS", "BILITOT", "BILIDIR", "IBILI" in the last 72 hours.     Sedimentation Rate No results for input(s): "ESRSEDRATE" in the last 72 hours. C-Reactive Protein No results for input(s): "CRP" in the last 72 hours.  Micro Results: Recent Results (from the past 720 hour(s))  Aerobic/Anaerobic Culture w Gram Stain (surgical/deep wound)     Status: None (Preliminary result)   Collection Time: 02/06/22  2:30 PM   Specimen: Wound  Result Value Ref Range Status   Specimen Description WOUND RIGHT FINGER  Final   Special Requests LONG FINGER SWAB  Final   Gram Stain   Final    FEW WBC PRESENT, PREDOMINANTLY PMN MODERATE GRAM POSITIVE COCCI IN PAIRS    Culture   Final    ABUNDANT GROUP B STREP(S.AGALACTIAE)ISOLATED TESTING AGAINST S. AGALACTIAE NOT ROUTINELY PERFORMED DUE TO PREDICTABILITY OF AMP/PEN/VAN SUSCEPTIBILITY. FEW STAPHYLOCOCCUS AUREUS HOLDING FOR POSSIBLE ANAEROBE Performed at [facility], [place] [address] [place], [place] [address]    Report Status PENDING  Incomplete   Organism ID, Bacteria STAPHYLOCOCCUS AUREUS  Final      Susceptibility   Staphylococcus aureus - MIC*    CIPROFLOXACIN <=0.5 SENSITIVE Sensitive     ERYTHROMYCIN <=0.25 SENSITIVE Sensitive     GENTAMICIN <=0.5 SENSITIVE Sensitive     OXACILLIN <=0.25 SENSITIVE Sensitive     TETRACYCLINE <=1  SENSITIVE Sensitive     VANCOMYCIN 1 SENSITIVE Sensitive     TRIMETH/SULFA <=10 SENSITIVE Sensitive     CLINDAMYCIN <=0.25 SENSITIVE Sensitive     RIFAMPIN <=0.5 SENSITIVE Sensitive     Inducible Clindamycin NEGATIVE Sensitive     * FEW  STAPHYLOCOCCUS AUREUS    Studies/Results: [place] EKG SITE RITE  Result Date: 02/08/2022 If Site Rite image not attached, placement could not be confirmed due to current cardiac rhythm.  [place] EKG SITE RITE  Result Date: 02/08/2022 If Site Rite image not attached, placement could not be confirmed due to current cardiac rhythm.     Assessment/Plan:  INTERVAL HISTORY: Cultures from the operating room have yielded group B streptococcus and MSSA confirmed with Philippa Chester from microbiology lab and now available in epic   Principal Problem:   Infection of flexor tendon sheath Active Problems:   Uncontrolled type 2 diabetes mellitus with hyperglycemia, with long-term current use of insulin (HCC)   Hypothyroid   Finger osteomyelitis, right (HCC)   Leukocytosis   History of COVID-19   IBS (irritable bowel syndrome)   Hypertriglyceridemia   Obesity, Class III, BMI 40-49.9 (morbid obesity) ([place])   Furuncles    Wanda Oneill is a 42 y.o. female with Down syndrome hypothyroidism IBS type 2 diabetes history of chronic furuncles and finger lesions now with osteomyelitis right distal phalanx status post surgery with cultures in the operating room having yielded group B streptococcus and methicillin sensitive Staphylococcus aureus.  #1 Osteomyelitis of the right phalanx:  We will start cefazolin.  Note pharmacy-- Clare Gandy noted allergy to cefuroxime thoug this has different side chain than ancef  Hopefully she tolerartes ancef without otherwise we will have to shift back to daptomycin.  Plan if she does not have difficulty with cefazolin is 6 weeks of postoperative cefazolin.   Diagnosis: Osteomyelitis of the distal right phalanx  Culture  Result:GBS and MSSA   Allergies  Allergen Reactions   Vancomycin Itching   Cefuroxime Axetil Hives   Sulfa Antibiotics Hives    OPAT Orders Discharge antibiotics to be given via PICC line Discharge antibiotics:Cefazolin 2g IV q8 hours   Aim for Vancomycin trough 15-20 or AUC 400-550 (unless otherwise indicated) Duration: 6 weeks End Date: 03/20/22   [facility] Care Per Protocol:  Home health RN for IV administration and teaching; PICC line care and labs.    Labs weekly while on IV antibiotics: _x_ CBC with differential _x_ BMP  _x_ CRP _x_ ESR   x__ Please pull PIC at completion of IV antibiotics __ Please leave PIC in place until doctor has seen patient or been notified  Fax weekly labs to [phone]  Clinic Follow Up Appt:   TAUNIA FRASCO has an appointment on 03/18/2022 with Dr. Baxter Flattery at [facility], which  is located in the [facility] at  [address] in [place].  [address], which is located to the left of the elevators.  Phone: [phone]  Fax: [phone]  [email or web address]  The patient should arrive 30 minutes prior to their appoitment.  I spent 54 minutes with the patient including than 50% of the time in face to face counseling of the patient and her Mom re her osteomyelitis along with review of medical records in preparation for the visit and during the visit and in coordination of his care.    LOS: 3 days   Alcide Evener 02/09/2022, 3:31 PM

## 2022-02-09 NOTE — TOC Progression Note (Signed)
Transition of Care [facility]) - Progression Note    Patient Details  Name: Wanda Oneill MRN: [id number] Date of Birth: [birth year]  Transition of [facility]) CM/SW Contact  Bartholomew Crews, RN Phone Number: [phone] 02/09/2022, 12:07 PM  Clinical Narrative:     Received message from [facility], liaison at [facility], that patient is being set up for IV antibiotics with anticipated transition home either today vs tomorrow pending final ID report. [place] RN arranged with [facility] per previous RNCM. Pam completed teaching about IV administration with family yesterday. MD advised that patient wants to dc home today pending ID confirming OPAT and IV team placing PICC.   Expected Discharge Plan: [place] Barriers to Discharge: Continued Medical Work up  Expected Discharge Plan and Services   Discharge Planning Services: CM Consult Post Acute Care Choice: [place] arrangements for the past 2 months: [place]: RN   Date [place]: 02/08/22 Time [place]: [id number] Representative spoke with at [place]: Adela Lank   Social Determinants of Health ([place]) Interventions SDOH Screenings   Food Insecurity: No Food Insecurity (02/06/2022)  Housing: Low Risk  (02/06/2022)  Transportation Needs: No Transportation Needs (02/06/2022)  Utilities: Not At Risk (02/06/2022)  Alcohol Screen: Low Risk  (10/25/2020)  Depression (PHQ2-9): Low Risk  (01/09/2022)  Financial Resource Strain: Low Risk  (01/09/2022)  Physical Activity: Insufficiently Active (01/09/2022)  Social Connections: Moderately Integrated (01/09/2022)  Stress: No Stress Concern Present (01/09/2022)  Tobacco Use: Low Risk  (02/07/2022)    Readmission Risk Interventions    06/01/2019    1:56 PM 05/23/2019    2:04 PM  Readmission Risk Prevention Plan  Post Dischage Appt Complete Complete  Medication Screening Complete Complete  Transportation Screening Complete  Complete

## 2022-02-09 NOTE — Progress Notes (Addendum)
PHARMACY CONSULT NOTE FOR:  OUTPATIENT  PARENTERAL ANTIBIOTIC THERAPY (OPAT)  Indication: R finger MSSA osteomyelitis Regimen: Cefazolin 2g IV q8 hours End date: 03/20/22  IV antibiotic discharge orders are pended. To discharging provider:  please sign these orders via discharge navigator,  Select New Orders & click on the button choice - Manage This Unsigned Work.    Comments: patient with cefuroxime allergy listed as hives. D/w patient's mom and she believes patient may have had another cephalosporin in the past. Cefuroxime and cefazolin have different chemical side chains so chance of cross-reactivity is low. Will give first dose of Ancef in-hospital to monitor for tolerability. D/w ID team.   Thank you for allowing pharmacy to be a part of this patient's care.  Dimple Nanas, PharmD, BCPS 02/09/2022 12:26 PM

## 2022-02-09 NOTE — Plan of Care (Signed)
Patient ID: Wanda Oneill, female   DOB: 06/25/1980, 42 y.o.   MRN: [id number]  Problem: Education: Goal: Knowledge of General Education information will improve Description: Including pain rating scale, medication(s)/side effects and non-pharmacologic comfort measures Outcome: Adequate for Discharge   Problem: Health Behavior/Discharge Planning: Goal: Ability to manage health-related needs will improve Outcome: Adequate for Discharge   Problem: Clinical Measurements: Goal: Ability to maintain clinical measurements within normal limits will improve Outcome: Adequate for Discharge Goal: Will remain free from infection Outcome: Adequate for Discharge Goal: Diagnostic test results will improve Outcome: Adequate for Discharge Goal: Respiratory complications will improve Outcome: Adequate for Discharge Goal: Cardiovascular complication will be avoided Outcome: Adequate for Discharge   Problem: Activity: Goal: Risk for activity intolerance will decrease Outcome: Adequate for Discharge   Problem: Nutrition: Goal: Adequate nutrition will be maintained Outcome: Adequate for Discharge   Problem: Coping: Goal: Level of anxiety will decrease Outcome: Adequate for Discharge   Problem: Elimination: Goal: Will not experience complications related to bowel motility Outcome: Adequate for Discharge Goal: Will not experience complications related to urinary retention Outcome: Adequate for Discharge   Problem: Pain Managment: Goal: General experience of comfort will improve Outcome: Adequate for Discharge   Problem: Safety: Goal: Ability to remain free from injury will improve Outcome: Adequate for Discharge   Problem: Skin Integrity: Goal: Risk for impaired skin integrity will decrease Outcome: Adequate for Discharge   Haydee Salter, RN

## 2022-02-10 DIAGNOSIS — M869 Osteomyelitis, unspecified: Secondary | ICD-10-CM | POA: Diagnosis not present

## 2022-02-10 DIAGNOSIS — Q909 Down syndrome, unspecified: Secondary | ICD-10-CM | POA: Diagnosis not present

## 2022-02-10 DIAGNOSIS — B9561 Methicillin susceptible Staphylococcus aureus infection as the cause of diseases classified elsewhere: Secondary | ICD-10-CM | POA: Diagnosis not present

## 2022-02-10 DIAGNOSIS — Z6841 Body Mass Index (BMI) 40.0 and over, adult: Secondary | ICD-10-CM | POA: Diagnosis not present

## 2022-02-10 DIAGNOSIS — E1169 Type 2 diabetes mellitus with other specified complication: Secondary | ICD-10-CM | POA: Diagnosis not present

## 2022-02-10 DIAGNOSIS — B951 Streptococcus, group B, as the cause of diseases classified elsewhere: Secondary | ICD-10-CM | POA: Diagnosis not present

## 2022-02-10 DIAGNOSIS — E039 Hypothyroidism, unspecified: Secondary | ICD-10-CM | POA: Diagnosis not present

## 2022-02-10 DIAGNOSIS — E1165 Type 2 diabetes mellitus with hyperglycemia: Secondary | ICD-10-CM | POA: Diagnosis not present

## 2022-02-11 ENCOUNTER — Telehealth: Payer: Self-pay | Admitting: Infectious Disease

## 2022-02-11 DIAGNOSIS — M869 Osteomyelitis, unspecified: Secondary | ICD-10-CM | POA: Diagnosis not present

## 2022-02-11 NOTE — Progress Notes (Incomplete)
Add [employer]

## 2022-02-11 NOTE — Telephone Encounter (Signed)
Echnocandin added see my addended last inpatient note. Father notified.

## 2022-02-12 DIAGNOSIS — E1169 Type 2 diabetes mellitus with other specified complication: Secondary | ICD-10-CM | POA: Diagnosis not present

## 2022-02-12 DIAGNOSIS — M86141 Other acute osteomyelitis, right hand: Secondary | ICD-10-CM | POA: Diagnosis not present

## 2022-02-12 DIAGNOSIS — E039 Hypothyroidism, unspecified: Secondary | ICD-10-CM | POA: Diagnosis not present

## 2022-02-12 DIAGNOSIS — B9561 Methicillin susceptible Staphylococcus aureus infection as the cause of diseases classified elsewhere: Secondary | ICD-10-CM | POA: Diagnosis not present

## 2022-02-12 DIAGNOSIS — M869 Osteomyelitis, unspecified: Secondary | ICD-10-CM | POA: Diagnosis not present

## 2022-02-12 DIAGNOSIS — Q909 Down syndrome, unspecified: Secondary | ICD-10-CM | POA: Diagnosis not present

## 2022-02-12 DIAGNOSIS — B951 Streptococcus, group B, as the cause of diseases classified elsewhere: Secondary | ICD-10-CM | POA: Diagnosis not present

## 2022-02-12 DIAGNOSIS — E1165 Type 2 diabetes mellitus with hyperglycemia: Secondary | ICD-10-CM | POA: Diagnosis not present

## 2022-02-12 DIAGNOSIS — Z6841 Body Mass Index (BMI) 40.0 and over, adult: Secondary | ICD-10-CM | POA: Diagnosis not present

## 2022-02-13 DIAGNOSIS — M00041 Staphylococcal arthritis, right hand: Secondary | ICD-10-CM | POA: Diagnosis not present

## 2022-02-13 DIAGNOSIS — M6510 Other infective (teno)synovitis, unspecified site: Secondary | ICD-10-CM | POA: Diagnosis not present

## 2022-02-13 DIAGNOSIS — L02511 Cutaneous abscess of right hand: Secondary | ICD-10-CM | POA: Diagnosis not present

## 2022-02-14 ENCOUNTER — Other Ambulatory Visit: Payer: Self-pay | Admitting: Family Medicine

## 2022-02-14 ENCOUNTER — Telehealth (HOSPITAL_BASED_OUTPATIENT_CLINIC_OR_DEPARTMENT_OTHER): Payer: Self-pay

## 2022-02-14 ENCOUNTER — Ambulatory Visit: Payer: Medicare HMO | Admitting: Family Medicine

## 2022-02-14 DIAGNOSIS — R051 Acute cough: Secondary | ICD-10-CM

## 2022-02-14 MED ORDER — BENZONATATE 200 MG PO CAPS: 200.0000 mg | ORAL_CAPSULE | Freq: Three times a day (TID) | ORAL | 1 refills | Status: DC | PRN

## 2022-02-14 NOTE — Progress Notes (Signed)
Called in [place] for her

## 2022-02-14 NOTE — Telephone Encounter (Signed)
Mother called stating patient is having some spot bleeding while taking the norithindrone '5mg'$  twice daily. LMOM , Per Dr. Sabra Heck patient can increase Norithindrone '10mg'$  in the morning and '5mg'$  in the afternoon. Patient was advised to call the office if a new Rx is needed. tbw

## 2022-02-15 DIAGNOSIS — M00041 Staphylococcal arthritis, right hand: Secondary | ICD-10-CM | POA: Diagnosis not present

## 2022-02-15 DIAGNOSIS — L02511 Cutaneous abscess of right hand: Secondary | ICD-10-CM | POA: Diagnosis not present

## 2022-02-15 DIAGNOSIS — E1165 Type 2 diabetes mellitus with hyperglycemia: Secondary | ICD-10-CM | POA: Diagnosis not present

## 2022-02-15 DIAGNOSIS — M6510 Other infective (teno)synovitis, unspecified site: Secondary | ICD-10-CM | POA: Diagnosis not present

## 2022-02-15 LAB — ANTIFUNGAL AST 9 DRUG PANEL
Amphotericin B MIC: 1
Fluconazole Islt MIC: 16
Flucytosine MIC: 0.06
Itraconazole MIC: 1
Posaconazole MIC: 2
Voriconazole MIC: 0.5

## 2022-02-15 LAB — AEROBIC/ANAEROBIC CULTURE W GRAM STAIN (SURGICAL/DEEP WOUND)

## 2022-02-16 ENCOUNTER — Other Ambulatory Visit: Payer: Self-pay | Admitting: Family Medicine

## 2022-02-16 MED ORDER — DICYCLOMINE HCL 20 MG PO TABS: 20.0000 mg | ORAL_TABLET | Freq: Three times a day (TID) | ORAL | 1 refills | Status: DC | PRN

## 2022-02-19 DIAGNOSIS — Q909 Down syndrome, unspecified: Secondary | ICD-10-CM | POA: Diagnosis not present

## 2022-02-19 DIAGNOSIS — E1165 Type 2 diabetes mellitus with hyperglycemia: Secondary | ICD-10-CM | POA: Diagnosis not present

## 2022-02-19 DIAGNOSIS — B9561 Methicillin susceptible Staphylococcus aureus infection as the cause of diseases classified elsewhere: Secondary | ICD-10-CM | POA: Diagnosis not present

## 2022-02-19 DIAGNOSIS — M6510 Other infective (teno)synovitis, unspecified site: Secondary | ICD-10-CM | POA: Diagnosis not present

## 2022-02-19 DIAGNOSIS — Z6841 Body Mass Index (BMI) 40.0 and over, adult: Secondary | ICD-10-CM | POA: Diagnosis not present

## 2022-02-19 DIAGNOSIS — L02511 Cutaneous abscess of right hand: Secondary | ICD-10-CM | POA: Diagnosis not present

## 2022-02-19 DIAGNOSIS — M00041 Staphylococcal arthritis, right hand: Secondary | ICD-10-CM | POA: Diagnosis not present

## 2022-02-19 DIAGNOSIS — T148XXA Other injury of unspecified body region, initial encounter: Secondary | ICD-10-CM | POA: Diagnosis not present

## 2022-02-19 DIAGNOSIS — E039 Hypothyroidism, unspecified: Secondary | ICD-10-CM | POA: Diagnosis not present

## 2022-02-19 DIAGNOSIS — M869 Osteomyelitis, unspecified: Secondary | ICD-10-CM | POA: Diagnosis not present

## 2022-02-19 DIAGNOSIS — B951 Streptococcus, group B, as the cause of diseases classified elsewhere: Secondary | ICD-10-CM | POA: Diagnosis not present

## 2022-02-19 DIAGNOSIS — E1169 Type 2 diabetes mellitus with other specified complication: Secondary | ICD-10-CM | POA: Diagnosis not present

## 2022-02-22 DIAGNOSIS — T148XXA Other injury of unspecified body region, initial encounter: Secondary | ICD-10-CM | POA: Diagnosis not present

## 2022-02-22 DIAGNOSIS — M869 Osteomyelitis, unspecified: Secondary | ICD-10-CM | POA: Diagnosis not present

## 2022-02-22 DIAGNOSIS — M00041 Staphylococcal arthritis, right hand: Secondary | ICD-10-CM | POA: Diagnosis not present

## 2022-02-22 DIAGNOSIS — L02511 Cutaneous abscess of right hand: Secondary | ICD-10-CM | POA: Diagnosis not present

## 2022-02-22 DIAGNOSIS — M6510 Other infective (teno)synovitis, unspecified site: Secondary | ICD-10-CM | POA: Diagnosis not present

## 2022-02-25 ENCOUNTER — Other Ambulatory Visit: Payer: Self-pay | Admitting: Family Medicine

## 2022-02-26 DIAGNOSIS — Z6841 Body Mass Index (BMI) 40.0 and over, adult: Secondary | ICD-10-CM | POA: Diagnosis not present

## 2022-02-26 DIAGNOSIS — B9561 Methicillin susceptible Staphylococcus aureus infection as the cause of diseases classified elsewhere: Secondary | ICD-10-CM | POA: Diagnosis not present

## 2022-02-26 DIAGNOSIS — L02511 Cutaneous abscess of right hand: Secondary | ICD-10-CM | POA: Diagnosis not present

## 2022-02-26 DIAGNOSIS — E1169 Type 2 diabetes mellitus with other specified complication: Secondary | ICD-10-CM | POA: Diagnosis not present

## 2022-02-26 DIAGNOSIS — E039 Hypothyroidism, unspecified: Secondary | ICD-10-CM | POA: Diagnosis not present

## 2022-02-26 DIAGNOSIS — M00041 Staphylococcal arthritis, right hand: Secondary | ICD-10-CM | POA: Diagnosis not present

## 2022-02-26 DIAGNOSIS — B951 Streptococcus, group B, as the cause of diseases classified elsewhere: Secondary | ICD-10-CM | POA: Diagnosis not present

## 2022-02-26 DIAGNOSIS — Q909 Down syndrome, unspecified: Secondary | ICD-10-CM | POA: Diagnosis not present

## 2022-02-26 DIAGNOSIS — M869 Osteomyelitis, unspecified: Secondary | ICD-10-CM | POA: Diagnosis not present

## 2022-02-26 DIAGNOSIS — E1165 Type 2 diabetes mellitus with hyperglycemia: Secondary | ICD-10-CM | POA: Diagnosis not present

## 2022-02-26 DIAGNOSIS — M6510 Other infective (teno)synovitis, unspecified site: Secondary | ICD-10-CM | POA: Diagnosis not present

## 2022-02-26 DIAGNOSIS — M25641 Stiffness of right hand, not elsewhere classified: Secondary | ICD-10-CM | POA: Diagnosis not present

## 2022-02-26 DIAGNOSIS — T148XXA Other injury of unspecified body region, initial encounter: Secondary | ICD-10-CM | POA: Diagnosis not present

## 2022-02-28 ENCOUNTER — Ambulatory Visit: Payer: Medicare HMO | Admitting: Sports Medicine

## 2022-03-01 DIAGNOSIS — L02511 Cutaneous abscess of right hand: Secondary | ICD-10-CM | POA: Diagnosis not present

## 2022-03-01 DIAGNOSIS — M869 Osteomyelitis, unspecified: Secondary | ICD-10-CM | POA: Diagnosis not present

## 2022-03-01 DIAGNOSIS — M00041 Staphylococcal arthritis, right hand: Secondary | ICD-10-CM | POA: Diagnosis not present

## 2022-03-01 DIAGNOSIS — T148XXA Other injury of unspecified body region, initial encounter: Secondary | ICD-10-CM | POA: Diagnosis not present

## 2022-03-01 DIAGNOSIS — M6510 Other infective (teno)synovitis, unspecified site: Secondary | ICD-10-CM | POA: Diagnosis not present

## 2022-03-01 DIAGNOSIS — M25641 Stiffness of right hand, not elsewhere classified: Secondary | ICD-10-CM | POA: Diagnosis not present

## 2022-03-04 DIAGNOSIS — M6510 Other infective (teno)synovitis, unspecified site: Secondary | ICD-10-CM | POA: Diagnosis not present

## 2022-03-04 DIAGNOSIS — T148XXA Other injury of unspecified body region, initial encounter: Secondary | ICD-10-CM | POA: Diagnosis not present

## 2022-03-04 DIAGNOSIS — L02511 Cutaneous abscess of right hand: Secondary | ICD-10-CM | POA: Diagnosis not present

## 2022-03-04 DIAGNOSIS — M25641 Stiffness of right hand, not elsewhere classified: Secondary | ICD-10-CM | POA: Diagnosis not present

## 2022-03-04 DIAGNOSIS — M00041 Staphylococcal arthritis, right hand: Secondary | ICD-10-CM | POA: Diagnosis not present

## 2022-03-05 ENCOUNTER — Encounter: Payer: Self-pay | Admitting: Internal Medicine

## 2022-03-05 ENCOUNTER — Other Ambulatory Visit: Payer: Self-pay

## 2022-03-05 ENCOUNTER — Ambulatory Visit (INDEPENDENT_AMBULATORY_CARE_PROVIDER_SITE_OTHER): Payer: Medicare HMO | Admitting: Internal Medicine

## 2022-03-05 VITALS — BP 111/73 | HR 76 | Temp 98.0°F | Ht 64.0 in | Wt 232.0 lb

## 2022-03-05 DIAGNOSIS — L270 Generalized skin eruption due to drugs and medicaments taken internally: Secondary | ICD-10-CM

## 2022-03-05 DIAGNOSIS — M869 Osteomyelitis, unspecified: Secondary | ICD-10-CM

## 2022-03-05 DIAGNOSIS — E1165 Type 2 diabetes mellitus with hyperglycemia: Secondary | ICD-10-CM | POA: Diagnosis not present

## 2022-03-05 DIAGNOSIS — E1169 Type 2 diabetes mellitus with other specified complication: Secondary | ICD-10-CM | POA: Diagnosis not present

## 2022-03-05 DIAGNOSIS — Q909 Down syndrome, unspecified: Secondary | ICD-10-CM | POA: Diagnosis not present

## 2022-03-05 DIAGNOSIS — B9561 Methicillin susceptible Staphylococcus aureus infection as the cause of diseases classified elsewhere: Secondary | ICD-10-CM | POA: Diagnosis not present

## 2022-03-05 DIAGNOSIS — B951 Streptococcus, group B, as the cause of diseases classified elsewhere: Secondary | ICD-10-CM | POA: Diagnosis not present

## 2022-03-05 DIAGNOSIS — E039 Hypothyroidism, unspecified: Secondary | ICD-10-CM | POA: Diagnosis not present

## 2022-03-05 DIAGNOSIS — Z6841 Body Mass Index (BMI) 40.0 and over, adult: Secondary | ICD-10-CM | POA: Diagnosis not present

## 2022-03-05 MED ORDER — LINEZOLID 600 MG PO TABS: 600.0000 mg | ORAL_TABLET | Freq: Two times a day (BID) | ORAL | 0 refills | Status: DC

## 2022-03-05 MED ORDER — FLUCONAZOLE 200 MG PO TABS: 800.0000 mg | ORAL_TABLET | Freq: Every day | ORAL | 0 refills | Status: DC

## 2022-03-05 MED ORDER — LINEZOLID 600 MG PO TABS: 600.0000 mg | ORAL_TABLET | Freq: Two times a day (BID) | ORAL | 0 refills | Status: AC

## 2022-03-05 NOTE — Patient Instructions (Signed)
Lets stop IV abx  Start linezolid 600 mg twice daily  Start fluconazole 800 mg once a day (you can split into 400 and 400 with breakfast/lunch but don't spread it apart more than that)   I am unclear if the fungus plays a role in your bone infection. The culture was send as a bone swab rather than actual bone. We often disregard certain organism like yeast unless it is in several samples. In this case only one sample was sent and again it is a swab   Leave picc in. If tolerate oral pills then picc can be removed (let us know)   Finish linezolid on 2/14. Will let dr Tommy Medal or Dr Baxter Flattery decide if you need to stay on fluconazole beyond 2/14 (due to reasoning above).

## 2022-03-05 NOTE — Progress Notes (Signed)
[facility]  Patient Active Problem List   Diagnosis Date Noted   Furuncles 02/08/2022   Infection of flexor tendon sheath 02/06/2022   Finger osteomyelitis, right ([place]) 02/06/2022   Leukocytosis 02/06/2022   History of COVID-19 02/06/2022   IBS (irritable bowel syndrome) 02/06/2022   Hypertriglyceridemia 02/06/2022   Obesity, Class III, BMI 40-49.9 (morbid obesity) ([place]) 02/06/2022   Chronic right-sided low back pain 11/02/2021   Bilateral trigger thumb 06/18/2021   Chronic paronychia of finger 01/25/2021   S/P left unicompartmental knee replacement, lateral 06/29/2020   At risk for obstructive sleep apnea 06/11/2019   Cough 06/11/2019   Hyperglycemia 05/26/2019   Acute hypoxemic respiratory failure ([place]) 05/20/2019   Fibromyalgia 03/09/2019   Neck pain, chronic 03/09/2019   Acute shoulder bursitis, right 02/03/2019   Bile salt-induced diarrhea 09/25/2018   Arthritis of left acromioclavicular joint 09/23/2018   Precordial chest pain 08/11/2018   Morbid obesity ([place]) 10/31/2017   S/P right UKR, lateral 10/30/2017   Incarcerated epigastric hernia 05/30/2015   Left foot pain 08/09/2014   Chronic diarrhea 07/27/2014   Gout 08/10/2012   Acid reflux 01/22/2012   Down syndrome 11/11/2011   Hernia of abdominal wall 10/21/2011   Psoriasis 10/21/2011   Uncontrolled type 2 diabetes mellitus with hyperglycemia, with long-term current use of insulin ([place]) 04/12/2011   Hypothyroid 04/12/2011   Gait abnormality 06/22/2010   Tibial tendinitis, posterior 06/22/2010      Subjective:    Patient ID: Wanda Oneill, female    DOB: [birth year], 42 y.o.   MRN: [id number]  Chief Complaint  Patient presents with   Follow-up    Rash due to Iv abx    HPI:  Wanda Oneill is a 42 y.o. female with right long finger felon s/p I&D 1/03 here for hospital f/u  I reviewed chart note She was evaluated by dr Tommy Medal in the hospital  Procedure note  reviewed from 1/03: 1.  Right long finger incision and drainage flexor sheath infection 2.  Right long finger incision and drainage DIP joint 3.  Right long finger incision and drainage felon and distal phalanx osteomyelitis 4.  Left index finger incision and drainage paronychia with removal of nail plate  Cultures: 10/07/24 right long finger swab (operative) -- appears only 1 sample sent   ABUNDANT GROUP B STREP(S.AGALACTIAE)ISOLATED TESTING AGAINST S. AGALACTIAE NOT ROUTINELY PERFORMED DUE TO PREDICTABILITY OF AMP/PEN/VAN SUSCEPTIBILITY. FEW STAPHYLOCOCCUS AUREUS RARE CANDIDA GLABRATA SUSCEPTIBILITIES  Performed at [facility] REPORT NO ANAEROBES ISOLATED    She was discharged on cefazolin 6 weeks till 03/20/22. Micafungin was added 1/08 when the cx grew candida glabrata as well. The glabrata is susceptible dose dependent   She developed non-itchy rash erythematous macule several days (at least a week) out of micafungin. No new meds. Started in the shower, but has been persistent No change in taste, n/v/diarrhea, malaise, arthralgia   Abx: Cefazolin 1/1-c Micafungin 1/8-c    Allergies  Allergen Reactions   Vancomycin Itching   Cefuroxime Axetil Hives   Sulfa Antibiotics Hives      Outpatient Medications Prior to Visit  Medication Sig Dispense Refill   acetaminophen (TYLENOL) 500 MG tablet Take 1,000 mg by mouth at bedtime.     allopurinol (ZYLOPRIM) 300 MG tablet TAKE 1 TABLET EVERY DAY 90 tablet 10   Blood Glucose Monitoring Suppl (BLOOD GLUCOSE METER KIT AND SUPPLIES) Dispense based on patient and insurance preference. To  check blood sugars daily FOR ICD-9 250.00 (Patient taking differently: 1 each by Other route See admin instructions. Dispense based on patient and insurance preference. To check blood sugars daily FOR ICD-9 250.00) 1 each 0   ceFAZolin (ANCEF) IVPB Inject 2 g into the vein every 8 (eight) hours. Indication:  osteomyelitis First  Dose: No Last Day of Therapy:  03/20/22 Labs - Once weekly:  CBC/D and BMP, Labs - Every other week:  ESR and CRP Method of administration: IV Push Method of administration may be changed at the discretion of home infusion pharmacist based upon assessment of the patient and/or caregiver's ability to self-administer the medication ordered. 117 Units 0   cholestyramine (QUESTRAN) 4 g packet MIX 1 PACKET IN LIQUID AND DRINK TWICE DAILY 180 packet 3   dicyclomine (BENTYL) 20 MG tablet Take 1 tablet (20 mg total) by mouth every 8 (eight) hours as needed for spasms. 50 tablet 1   empagliflozin (JARDIANCE) 25 MG TABS tablet Take 1 tablet (25 mg total) by mouth daily. 90 tablet 3   fenofibrate micronized (LOFIBRA) 134 MG capsule Take 134 mg by mouth daily before breakfast.     FLUCELVAX QUADRIVALENT 0.5 ML injection      Glucose Blood (BLOOD GLUCOSE TEST STRIPS) STRP 1 each by Other route See admin instructions. Use to test blood sugar up to twice a day 100 strip 3   glucose blood (FREESTYLE TEST STRIPS) test strip Use as directed to test blood sugar up to twice a day. Dx code E11.9 100 each 12   glucose blood (TRUE METRIX BLOOD GLUCOSE TEST) test strip Use as instructed to check glucose up to 2 times daily. Dx Code E11.9 100 each 12   levothyroxine (SYNTHROID) 150 MCG tablet TAKE 1 TABLET EVERY DAY BEFORE BREAKFAST (Patient taking differently: Take 150 mcg by mouth daily before breakfast.) 90 tablet 1   metFORMIN (GLUCOPHAGE) 1000 MG tablet TAKE 1 TABLET TWICE DAILY 180 tablet 3   micafungin (MYCAMINE) 100 MG SOLR injection Inject into the vein.     naproxen sodium (ALEVE) 220 MG tablet Take 440 mg by mouth daily.     norethindrone (AYGESTIN) 5 MG tablet Take 1 tablet (5 mg total) by mouth in the morning and at bedtime. 180 tablet 3   ondansetron (ZOFRAN) 4 MG tablet Take 4 mg by mouth once as needed for nausea or vomiting.     ONE TOUCH ULTRA TEST test strip USE TO CHECK BLOOD SUGAR ONCE DAILY (ICD  CODE:25.00) (Patient taking differently: 1 each by Other route daily.) 100 each 3   tirzepatide (MOUNJARO) 7.5 MG/0.5ML Pen 7.'5mg'$      TOUJEO SOLOSTAR 300 UNIT/ML Solostar Pen Inject 30 Units into the skin every evening.     TRUEplus Lancets 30G MISC Use as directed to check glucose up to two times daily. Dx code E11.9 100 each 3   benzonatate (TESSALON) 200 MG capsule Take 1 capsule (200 mg total) by mouth 3 (three) times daily as needed for cough. (Patient not taking: Reported on 03/05/2022) 60 capsule 1   metaxalone (SKELAXIN) 800 MG tablet Take 1 tablet (800 mg total) by mouth 3 (three) times daily as needed for muscle spasms. (Patient not taking: Reported on 03/05/2022) 30 tablet 1   nystatin (MYCOSTATIN/NYSTOP) powder Apply 1 application. topically 3 (three) times daily. (Patient not taking: Reported on 02/07/2022) 60 g 1   triazolam (HALCION) 0.25 MG tablet 1-2 tabs PO 2 hours before procedure or imaging.  Do not drive  with this medication. (Patient not taking: Reported on 02/07/2022) 2 tablet 0   No facility-administered medications prior to visit.     Social History   Socioeconomic History   Marital status: Single    Spouse name: Not on file   Number of children: Not on file   Years of education: Not on file   Highest education level: Not on file  Occupational History   Occupation: disabled  Tobacco Use   Smoking status: Never   Smokeless tobacco: Never  Vaping Use   Vaping Use: Never used  Substance and Sexual Activity   Alcohol use: No    Alcohol/week: 0.0 standard drinks of alcohol   Drug use: No   Sexual activity: Yes    Birth control/protection: Pill  Other Topics Concern   Not on file  Social History Narrative   Not on file   Social Determinants of Health   Financial Resource Strain: Low Risk  (01/09/2022)   Overall Financial Resource Strain (CARDIA)    Difficulty of Paying Living Expenses: Not hard at all  Food Insecurity: No Food Insecurity (02/06/2022)   Hunger  Vital Sign    Worried About Running Out of Food in the Last Year: Never true    [place] in the Last Year: Never true  Transportation Needs: No Transportation Needs (02/06/2022)   PRAPARE - Hydrologist (Medical): No    Lack of Transportation (Non-Medical): No  Physical Activity: Insufficiently Active (01/09/2022)   Exercise Vital Sign    Days of Exercise per Week: 5 days    Minutes of Exercise per Session: 10 min  Stress: No Stress Concern Present (01/09/2022)   [place]    Feeling of Stress : Only a little  Social Connections: Moderately Integrated (01/09/2022)   Social Connection and Isolation Panel [NHANES]    Frequency of Communication with Friends and Family: More than three times a week    Frequency of Social Gatherings with Friends and Family: More than three times a week    Attends Religious Services: More than 4 times per year    Active Member of [employer] or Organizations: Yes    Attends Archivist Meetings: 1 to 4 times per year    Marital Status: Never married  Intimate Partner Violence: Not At Risk (02/06/2022)   Humiliation, Afraid, Rape, and Kick questionnaire    Fear of Current or Ex-Partner: No    Emotionally Abused: No    Physically Abused: No    Sexually Abused: No      Review of Systems    All other ros negative  Objective:    BP 111/73   Pulse 76   Temp 98 F (36.7 C) (Temporal)   Ht '5\' 4"'$  (1.626 m)   Wt 232 lb (105.2 kg)   SpO2 100%   BMI 39.82 kg/m  Nursing note and vital signs reviewed.  Physical Exam     General/constitutional: no distress, pleasant; down syndrome feature HEENT: Normocephalic, PER, Conj Clear, EOMI, Oropharynx clear Neck supple CV: rrr no mrg Lungs: clear to auscultation, normal respiratory effort Abd: Soft, Nontender Ext: no edema Skin: see picture of the finger; diffuse truncal and bilateral upper  erythroderma     Neuro: nonfocal MSK: no peripheral joint swelling/tenderness/warmth; back spines nontender   Central line presence: lue picc site no purulence/tenderness   Labs:  Micro:  Serology:  Imaging:  Assessment & Plan:  Problem List Items Addressed This Visit   None Visit Diagnoses     Drug rash    -  Primary   Relevant Orders   CBC   COMPLETE METABOLIC PANEL WITH GFR   C-reactive protein   Osteomyelitis, unspecified site, unspecified type (HCC)       Relevant Medications   micafungin (MYCAMINE) 100 MG SOLR injection   Other Relevant Orders   CBC   COMPLETE METABOLIC PANEL WITH GFR   C-reactive protein         No orders of the defined types were placed in this encounter.  Distal phalanx right middle finger om. S/p I&D 02/06/22. Swab cx of long bone (1 sample) sent. Gbs, mssa, glabrata.  Rash a week out at least starting cefazolin/micafungin. Unclear if related. Rather strange and doesn't appear to be type 1 hypersensitivity nor type 3 based on hx.   Labs today  Switch abx to oral linezolid/fluconazole.   I am unclear if the fungus plays a role in your bone infection. The culture was send as a bone swab rather than actual bone. We often disregard certain organism like yeast unless it is in several samples. In this case only one sample was sent and again it is a swab  Will see what dr Baxter Flattery decide with fluconazole cessation date Linezolid to end 03/20/22 Will give enough fluconazole for feb 2024  Follow-up: No follow-ups on file.   I have spent a total of 50 minutes of face-to-face and non-face-to-face time, excluding clinical staff time, preparing to see patient, ordering tests and/or medications, and provide counseling the patient    Jabier Mutton, [facility] 03/05/2022, 9:35 AM

## 2022-03-06 LAB — COMPLETE METABOLIC PANEL WITH GFR
AG Ratio: 1.1 (calc) (ref 1.0–2.5)
ALT: 17 U/L (ref 6–29)
AST: 34 U/L — ABNORMAL HIGH (ref 10–30)
Albumin: 3.9 g/dL (ref 3.6–5.1)
Alkaline phosphatase (APISO): 46 U/L (ref 31–125)
BUN: 12 mg/dL (ref 7–25)
CO2: 22 mmol/L (ref 20–32)
Calcium: 9.7 mg/dL (ref 8.6–10.2)
Chloride: 100 mmol/L (ref 98–110)
Creat: 0.7 mg/dL (ref 0.50–0.99)
Globulin: 3.6 g/dL (calc) (ref 1.9–3.7)
Glucose, Bld: 237 mg/dL — ABNORMAL HIGH (ref 65–99)
Potassium: 4.7 mmol/L (ref 3.5–5.3)
Sodium: 138 mmol/L (ref 135–146)
Total Bilirubin: 0.3 mg/dL (ref 0.2–1.2)
Total Protein: 7.5 g/dL (ref 6.1–8.1)
eGFR: 111 mL/min/{1.73_m2} (ref >=60)

## 2022-03-06 LAB — CBC
HCT: 44.4 % (ref 35.0–45.0)
Hemoglobin: 14.1 g/dL (ref 11.7–15.5)
MCH: 26.6 pg — ABNORMAL LOW (ref 27.0–33.0)
MCHC: 31.8 g/dL — ABNORMAL LOW (ref 32.0–36.0)
MCV: 83.8 fL (ref 80.0–100.0)
MPV: 9.5 fL (ref 7.5–12.5)
Platelets: 403 10*3/uL — ABNORMAL HIGH (ref 140–400)
RBC: 5.3 10*6/uL — ABNORMAL HIGH (ref 3.80–5.10)
RDW: 16.3 % — ABNORMAL HIGH (ref 11.0–15.0)
WBC: 4.2 10*3/uL (ref 3.8–10.8)

## 2022-03-06 LAB — C-REACTIVE PROTEIN: CRP: 28.1 mg/L — ABNORMAL HIGH (ref <8.0)

## 2022-03-08 DIAGNOSIS — L02511 Cutaneous abscess of right hand: Secondary | ICD-10-CM | POA: Diagnosis not present

## 2022-03-08 DIAGNOSIS — M25641 Stiffness of right hand, not elsewhere classified: Secondary | ICD-10-CM | POA: Diagnosis not present

## 2022-03-08 DIAGNOSIS — M6510 Other infective (teno)synovitis, unspecified site: Secondary | ICD-10-CM | POA: Diagnosis not present

## 2022-03-08 DIAGNOSIS — M00041 Staphylococcal arthritis, right hand: Secondary | ICD-10-CM | POA: Diagnosis not present

## 2022-03-12 DIAGNOSIS — T148XXA Other injury of unspecified body region, initial encounter: Secondary | ICD-10-CM | POA: Diagnosis not present

## 2022-03-12 DIAGNOSIS — M00041 Staphylococcal arthritis, right hand: Secondary | ICD-10-CM | POA: Diagnosis not present

## 2022-03-12 DIAGNOSIS — M25641 Stiffness of right hand, not elsewhere classified: Secondary | ICD-10-CM | POA: Diagnosis not present

## 2022-03-12 DIAGNOSIS — M869 Osteomyelitis, unspecified: Secondary | ICD-10-CM | POA: Diagnosis not present

## 2022-03-12 DIAGNOSIS — M6510 Other infective (teno)synovitis, unspecified site: Secondary | ICD-10-CM | POA: Diagnosis not present

## 2022-03-12 DIAGNOSIS — L02511 Cutaneous abscess of right hand: Secondary | ICD-10-CM | POA: Diagnosis not present

## 2022-03-15 DIAGNOSIS — M6510 Other infective (teno)synovitis, unspecified site: Secondary | ICD-10-CM | POA: Diagnosis not present

## 2022-03-15 DIAGNOSIS — L02511 Cutaneous abscess of right hand: Secondary | ICD-10-CM | POA: Diagnosis not present

## 2022-03-15 DIAGNOSIS — T148XXA Other injury of unspecified body region, initial encounter: Secondary | ICD-10-CM | POA: Diagnosis not present

## 2022-03-15 DIAGNOSIS — M00041 Staphylococcal arthritis, right hand: Secondary | ICD-10-CM | POA: Diagnosis not present

## 2022-03-15 DIAGNOSIS — M25641 Stiffness of right hand, not elsewhere classified: Secondary | ICD-10-CM | POA: Diagnosis not present

## 2022-03-17 ENCOUNTER — Other Ambulatory Visit: Payer: Self-pay | Admitting: Family Medicine

## 2022-03-18 ENCOUNTER — Other Ambulatory Visit: Payer: Self-pay

## 2022-03-18 ENCOUNTER — Encounter: Payer: Self-pay | Admitting: Internal Medicine

## 2022-03-18 ENCOUNTER — Ambulatory Visit: Payer: Medicare HMO | Admitting: Internal Medicine

## 2022-03-18 ENCOUNTER — Ambulatory Visit (INDEPENDENT_AMBULATORY_CARE_PROVIDER_SITE_OTHER): Payer: Medicare HMO | Admitting: Internal Medicine

## 2022-03-18 VITALS — BP 127/78 | HR 80 | Wt 229.0 lb

## 2022-03-18 DIAGNOSIS — Z5181 Encounter for therapeutic drug level monitoring: Secondary | ICD-10-CM

## 2022-03-18 DIAGNOSIS — M86141 Other acute osteomyelitis, right hand: Secondary | ICD-10-CM | POA: Diagnosis not present

## 2022-03-18 NOTE — Progress Notes (Incomplete)
Patient Active Problem List   Diagnosis Date Noted  . Furuncles 02/08/2022  . Infection of flexor tendon sheath 02/06/2022  . Finger osteomyelitis, right ([place]) 02/06/2022  . Leukocytosis 02/06/2022  . History of COVID-19 02/06/2022  . IBS (irritable bowel syndrome) 02/06/2022  . Hypertriglyceridemia 02/06/2022  . Obesity, Class III, BMI 40-49.9 (morbid obesity) ([place]) 02/06/2022  . Chronic right-sided low back pain 11/02/2021  . Bilateral trigger thumb 06/18/2021  . Chronic paronychia of finger 01/25/2021  . S/P left unicompartmental knee replacement, lateral 06/29/2020  . At risk for obstructive sleep apnea 06/11/2019  . Cough 06/11/2019  . Hyperglycemia 05/26/2019  . Acute hypoxemic respiratory failure ([place]) 05/20/2019  . Fibromyalgia 03/09/2019  . Neck pain, chronic 03/09/2019  . Acute shoulder bursitis, right 02/03/2019  . Bile salt-induced diarrhea 09/25/2018  . Arthritis of left acromioclavicular joint 09/23/2018  . Precordial chest pain 08/11/2018  . Morbid obesity ([place]) 10/31/2017  . S/P right UKR, lateral 10/30/2017  . Incarcerated epigastric hernia 05/30/2015  . Left foot pain 08/09/2014  . Chronic diarrhea 07/27/2014  . Gout 08/10/2012  . Acid reflux 01/22/2012  . Down syndrome 11/11/2011  . Hernia of abdominal wall 10/21/2011  . Psoriasis 10/21/2011  . Uncontrolled type 2 diabetes mellitus with hyperglycemia, with long-term current use of insulin ([place]) 04/12/2011  . Hypothyroid 04/12/2011  . Gait abnormality 06/22/2010  . Tibial tendinitis, posterior 06/22/2010    Patient's Medications  New Prescriptions   No medications on file  Previous Medications   ACETAMINOPHEN (TYLENOL) 500 MG TABLET    Take 1,000 mg by mouth at bedtime.   ALLOPURINOL (ZYLOPRIM) 300 MG TABLET    TAKE 1 TABLET EVERY DAY   BENZONATATE (TESSALON) 200 MG CAPSULE    Take 1 capsule (200 mg total) by mouth 3 (three) times daily as needed for cough.   BLOOD GLUCOSE MONITORING  SUPPL (BLOOD GLUCOSE METER KIT AND SUPPLIES)    Dispense based on patient and insurance preference. To check blood sugars daily FOR ICD-9 250.00   CEFAZOLIN (ANCEF) IVPB    Inject 2 g into the vein every 8 (eight) hours. Indication:  osteomyelitis First Dose: No Last Day of Therapy:  03/20/22 Labs - Once weekly:  CBC/D and BMP, Labs - Every other week:  ESR and CRP Method of administration: IV Push Method of administration may be changed at the discretion of home infusion pharmacist based upon assessment of the patient and/or caregiver's ability to self-administer the medication ordered.   CHOLESTYRAMINE (QUESTRAN) 4 G PACKET    MIX 1 PACKET IN LIQUID AND DRINK TWICE DAILY   DICYCLOMINE (BENTYL) 20 MG TABLET    Take 1 tablet (20 mg total) by mouth every 8 (eight) hours as needed for spasms.   EMPAGLIFLOZIN (JARDIANCE) 25 MG TABS TABLET    Take 1 tablet (25 mg total) by mouth daily.   FENOFIBRATE MICRONIZED (LOFIBRA) 134 MG CAPSULE    Take 134 mg by mouth daily before breakfast.   FLUCELVAX QUADRIVALENT 0.5 ML INJECTION       FLUCONAZOLE (DIFLUCAN) 200 MG TABLET    Take 4 tablets (800 mg total) by mouth daily.   GLUCOSE BLOOD (BLOOD GLUCOSE TEST STRIPS) STRP    1 each by Other route See admin instructions. Use to test blood sugar up to twice a day   GLUCOSE BLOOD (FREESTYLE TEST STRIPS) TEST STRIP    Use as directed to test blood sugar up to twice a day.  Dx code E11.9   GLUCOSE BLOOD (TRUE METRIX BLOOD GLUCOSE TEST) TEST STRIP    Use as instructed to check glucose up to 2 times daily. Dx Code E11.9   LEVOTHYROXINE (SYNTHROID) 150 MCG TABLET    TAKE 1 TABLET EVERY DAY BEFORE BREAKFAST   LINEZOLID (ZYVOX) 600 MG TABLET    Take 1 tablet (600 mg total) by mouth 2 (two) times daily for 14 days.   METAXALONE (SKELAXIN) 800 MG TABLET    Take 1 tablet (800 mg total) by mouth 3 (three) times daily as needed for muscle spasms.   METFORMIN (GLUCOPHAGE) 1000 MG TABLET    TAKE 1 TABLET TWICE DAILY   MICAFUNGIN  (MYCAMINE) 100 MG SOLR INJECTION    Inject into the vein.   NAPROXEN SODIUM (ALEVE) 220 MG TABLET    Take 440 mg by mouth daily.   NORETHINDRONE (AYGESTIN) 5 MG TABLET    Take 1 tablet (5 mg total) by mouth in the morning and at bedtime.   NYSTATIN (MYCOSTATIN/NYSTOP) POWDER    Apply 1 application. topically 3 (three) times daily.   ONDANSETRON (ZOFRAN) 4 MG TABLET    Take 4 mg by mouth once as needed for nausea or vomiting.   ONE TOUCH ULTRA TEST TEST STRIP    USE TO CHECK BLOOD SUGAR ONCE DAILY (ICD CODE:25.00)   TIRZEPATIDE (MOUNJARO) 7.5 MG/0.5ML PEN    7.35m   TOUJEO SOLOSTAR 300 UNIT/ML SOLOSTAR PEN    Inject 30 Units into the skin every evening.   TRIAZOLAM (HALCION) 0.25 MG TABLET    1-2 tabs PO 2 hours before procedure or imaging.  Do not drive with this medication.   TRUEPLUS LANCETS 30G MISC    Use as directed to check glucose up to two times daily. Dx code E11.9  Modified Medications   No medications on file  Discontinued Medications   No medications on file    Subjective: ***  Abx: Cefazolin 1/1-c Micafungin 1/8-c Review of Systems: ROS  Past Medical History:  Diagnosis Date  . Diabetes mellitus    type II  . Down syndrome   . Family history of adverse reaction to anesthesia    mom has had n/v  . Gastritis   . Headache   . Hepatic steatosis   . Hidradenitis   . Hypothyroidism   . Irritable bowel syndrome   . Periumbilical hernia   . Pneumonia 03/2020   frequent   . Restless legs   . Tendonitis    chronic in left foot  . Thyroid disease    hypothyroidism    Social History   Tobacco Use  . Smoking status: Never  . Smokeless tobacco: Never  Vaping Use  . Vaping Use: Never used  Substance Use Topics  . Alcohol use: No    Alcohol/week: 0.0 standard drinks of alcohol  . Drug use: No    Family History  Problem Relation Age of Onset  . Thyroid cancer Mother   . Diabetes type II Mother   . High Cholesterol Mother   . Heart disease Mother   .  Diabetes type II Father   . Hypertension Father   . Stroke Father     Allergies  Allergen Reactions  . Vancomycin Itching  . Cefuroxime Axetil Hives  . Sulfa Antibiotics Hives    Health Maintenance  Topic Date Due  . Hepatitis C Screening  Never done  . OPHTHALMOLOGY EXAM  06/05/2019  . COVID-19 Vaccine (4 - 2023-24 season) 10/05/2021  .  FOOT EXAM  12/20/2021  . Diabetic kidney evaluation - Urine ACR  05/25/2022  . HEMOGLOBIN A1C  08/08/2022  . Medicare Annual Wellness (AWV)  01/10/2023  . Diabetic kidney evaluation - eGFR measurement  03/06/2023  . PAP SMEAR-Modifier  05/30/2024  . DTaP/Tdap/Td (2 - Td or Tdap) 03/06/2027  . INFLUENZA VACCINE  Completed  . HIV Screening  Completed  . HPV VACCINES  Aged Out    Objective:  There were no vitals filed for this visit. There is no height or weight on file to calculate BMI.  Physical Exam  Lab Results Lab Results  Component Value Date   WBC 4.2 03/05/2022   HGB 14.1 03/05/2022   HCT 44.4 03/05/2022   MCV 83.8 03/05/2022   PLT 403 (H) 03/05/2022    Lab Results  Component Value Date   CREATININE 0.70 03/05/2022   BUN 12 03/05/2022   NA 138 03/05/2022   K 4.7 03/05/2022   CL 100 03/05/2022   CO2 22 03/05/2022    Lab Results  Component Value Date   ALT 17 03/05/2022   AST 34 (H) 03/05/2022   ALKPHOS 35 (L) 02/05/2022   BILITOT 0.3 03/05/2022    Lab Results  Component Value Date   CHOL 166 12/20/2020   HDL 50.00 12/20/2020   LDLDIRECT 75.0 12/20/2020   TRIG 385.0 (H) 12/20/2020   CHOLHDL 3 12/20/2020   No results found for: "LABRPR", "RPRTITER" No results found for: "HIV1RNAQUANT", "HIV1RNAVL", "CD4TABS"   Problem List Items Addressed This Visit   None  Assessment/Plan (dose dependent) D/C picc Qtc 372 03/12/22 esr 90,crp 24, wbc 5.3, Scr0.78(bmp)   Wound improving, follwed by hand surgery -Follow-up Dr. Baxter Flattery in one month Laurice Record, MD [facility] 03/18/2022, 9:23 AM

## 2022-03-18 NOTE — Progress Notes (Unsigned)
PICC Removal    PICC length & location:  left cephalic 45 cm Removed per verbal order from: Dr. Candiss Norse  Blood thinners:  none per chart review Platelet count:  400 (03/12/22 from [place])   Site assessment: Dressing clean and dry. Extremity warm and dry. No redness, drainage, or swelling present at insertion site.   Pre-removal vital signs:  BP:  123/76 HR:  75 SpO2:  99% RA  Insertion site positioned below level of heart. No sutures present. Insertion site cleaned with CHG, catheter removed and petroleum dressing applied. Tip intact. Pressure held until hemostasis achieved.    Length of catheter removed:  43 cm, tip intact - notified Dr. Candiss Norse   Provided patient with after care instructions and precautions print out (via [place]). Reviewed this information with patient.   Patient verbalized understanding and agreement, all questions answered. Patient tolerated procedure well and remained in clinic under the care of RN 30 minutes post removal.  Post-observation vital signs:  BP:  127/78 HR:  80 SpO2:  98% RA  Notified Carolynn Sayers, RN with [facility] and [facility] pharmacy team of removal.  Beryle Flock, RN

## 2022-03-30 ENCOUNTER — Ambulatory Visit (INDEPENDENT_AMBULATORY_CARE_PROVIDER_SITE_OTHER): Payer: Medicare HMO

## 2022-03-30 DIAGNOSIS — M545 Low back pain, unspecified: Secondary | ICD-10-CM | POA: Diagnosis not present

## 2022-03-30 DIAGNOSIS — G8929 Other chronic pain: Secondary | ICD-10-CM | POA: Diagnosis not present

## 2022-03-30 DIAGNOSIS — M4126 Other idiopathic scoliosis, lumbar region: Secondary | ICD-10-CM | POA: Diagnosis not present

## 2022-04-01 DIAGNOSIS — L02511 Cutaneous abscess of right hand: Secondary | ICD-10-CM | POA: Diagnosis not present

## 2022-04-01 DIAGNOSIS — M6510 Other infective (teno)synovitis, unspecified site: Secondary | ICD-10-CM | POA: Diagnosis not present

## 2022-04-02 ENCOUNTER — Other Ambulatory Visit: Payer: Self-pay

## 2022-04-02 MED ORDER — FLUCONAZOLE 200 MG PO TABS: 800.0000 mg | ORAL_TABLET | Freq: Every day | ORAL | 0 refills | Status: DC

## 2022-04-02 NOTE — Addendum Note (Signed)
Addended by: Silverio Decamp on: 04/02/2022 04:34 PM   Modules accepted: Orders

## 2022-04-03 ENCOUNTER — Other Ambulatory Visit: Payer: Self-pay | Admitting: Orthopedic Surgery

## 2022-04-04 NOTE — Progress Notes (Signed)
Reviewed pt's hx with Dr Fransisco Beau. Case to be moved to main OR. Hassan Rowan at Dr Levell July office aware.

## 2022-04-08 ENCOUNTER — Ambulatory Visit (INDEPENDENT_AMBULATORY_CARE_PROVIDER_SITE_OTHER): Payer: Medicare HMO | Admitting: Family Medicine

## 2022-04-08 VITALS — BP 122/72 | HR 75 | Temp 97.6°F | Resp 18 | Ht 64.0 in | Wt 227.2 lb

## 2022-04-08 DIAGNOSIS — R32 Unspecified urinary incontinence: Secondary | ICD-10-CM

## 2022-04-08 DIAGNOSIS — Z8709 Personal history of other diseases of the respiratory system: Secondary | ICD-10-CM

## 2022-04-08 DIAGNOSIS — E118 Type 2 diabetes mellitus with unspecified complications: Secondary | ICD-10-CM | POA: Diagnosis not present

## 2022-04-08 MED ORDER — TIRZEPATIDE 10 MG/0.5ML ~~LOC~~ SOAJ: 10.0000 mg | SUBCUTANEOUS | 3 refills | Status: DC

## 2022-04-08 MED ORDER — RSVPREF3 VAC RECOMB ADJUVANTED 120 MCG/0.5ML IM SUSR: 0.5000 mL | Freq: Once | INTRAMUSCULAR | 0 refills | Status: AC

## 2022-04-08 NOTE — Progress Notes (Signed)
Wanda Oneill at [facility] [address], [place], [place] [address] [phone]  Date:  04/08/2022   Name:  Wanda Oneill   DOB:  11/04/1980   MRN:  [id number]  PCP:  Darreld Mclean, MD    Chief Complaint: Follow-up (Concerns/ questions: urinary incontinence x at least 2 months /Eye exam due/Foot exam is due/)  History of Present Illness:  Wanda Oneill is a 42 y.o. very pleasant female patient who presents with the following:  Wanda Oneill is seen today for follow-up. History of diabetes, hypothyroidism, obesity, Down syndrome and several associated MSK problems  -I last saw her in August Dr Chalmers Cater manages her diabetes but she has not been seen in a while,  her last A1c showed poor control of her diabetes  She is using metformin, Jardiance 25, metformin 1000 twice daily,Toujeo 30 units, Mounjaro 7.5 Lab Results  Component Value Date   HGBA1C 10.5 (H) 02/07/2022    Unfortunately since our last visit but he has suffered a severe right long finger infection.  She was admitted to the hospital on January 2 and discharged home January 6 She underwent surgery on January 3, infectious disease is helping manage her cultures-at most recent follow-up visit on February 28 it looks like they plan to go ahead and amputate the tip of the finger due to poor healing and prolonged requirement of IV antibiotics She is having the amputation done on Thursday of this week, today is Monday  She has noted some urinary incontinence for about 2 months  She does not have any dysuria or hematuria.  It is hard for her to describe if she is experiencing stress or urge incontinence, or both  Patient's mother also notes she is concerned about Wanda Oneill getting RSV.  Wanda Oneill had a very hard time with lung infections as a young child, they wonder if she would benefit from RSV infection given her chronic health problems.  We discussed current recommendation for RSV vaccination  for persons older than 60.  However, if they wish to have this done I do not think you will harm Wanda Oneill and could be helpful.  I spoke with the pharmacist here at the med center who is able to give her the shot with a prescription.  I provided a written prescription and they plan to do this after she recovers from her finger surgery  Patient Active Problem List   Diagnosis Date Noted   Furuncles 02/08/2022   Infection of flexor tendon sheath 02/06/2022   Finger osteomyelitis, right ([place]) 02/06/2022   Leukocytosis 02/06/2022   History of COVID-19 02/06/2022   IBS (irritable bowel syndrome) 02/06/2022   Hypertriglyceridemia 02/06/2022   Obesity, Class III, BMI 40-49.9 (morbid obesity) ([place]) 02/06/2022   Chronic right-sided low back pain 11/02/2021   Bilateral trigger thumb 06/18/2021   Chronic paronychia of finger 01/25/2021   S/P left unicompartmental knee replacement, lateral 06/29/2020   At risk for obstructive sleep apnea 06/11/2019   Cough 06/11/2019   Hyperglycemia 05/26/2019   Acute hypoxemic respiratory failure ([place]) 05/20/2019   Fibromyalgia 03/09/2019   Neck pain, chronic 03/09/2019   Acute shoulder bursitis, right 02/03/2019   Bile salt-induced diarrhea 09/25/2018   Arthritis of left acromioclavicular joint 09/23/2018   Precordial chest pain 08/11/2018   Morbid obesity ([place]) 10/31/2017   S/P right UKR, lateral 10/30/2017   Incarcerated epigastric hernia 05/30/2015   Left foot pain 08/09/2014   Chronic diarrhea 07/27/2014  Gout 08/10/2012   Acid reflux 01/22/2012   Down syndrome 11/11/2011   Hernia of abdominal wall 10/21/2011   Psoriasis 10/21/2011   Uncontrolled type 2 diabetes mellitus with hyperglycemia, with long-term current use of insulin ([place]) 04/12/2011   Hypothyroid 04/12/2011   Gait abnormality 06/22/2010   Tibial tendinitis, posterior 06/22/2010    Past Medical History:  Diagnosis Date   Diabetes mellitus    type II   Down syndrome    Family history  of adverse reaction to anesthesia    mom has had n/v   Gastritis    Headache    Hepatic steatosis    Hidradenitis    Hypothyroidism    Irritable bowel syndrome    Periumbilical hernia    Pneumonia 03/2020   frequent    Restless legs    Tendonitis    chronic in left foot   Thyroid disease    hypothyroidism    Past Surgical History:  Procedure Laterality Date   AXILLARY HIDRADENITIS EXCISION Bilateral    CHOLECYSTECTOMY     HERNIA REPAIR     multiple   INCISION AND DRAINAGE ABSCESS Right 02/06/2022   Procedure: INCISION AND DRAINAGE RIGHT HAND;  Surgeon: Leanora Cover, MD;  Location: [place];  Service: Orthopedics;  Laterality: Right;   INCISION AND DRAINAGE WOUND WITH NAILBED REPAIR Left 02/06/2022   Procedure: INCISION AND DRAINAGE LEFT INDEX FINGER;  Surgeon: Leanora Cover, MD;  Location: [place];  Service: Orthopedics;  Laterality: Left;   INCISIONAL HERNIA REPAIR  05/30/2015   LAPROSCOPIC   INCISIONAL HERNIA REPAIR N/A 05/30/2015   Procedure: LAPAROSCOPIC INCISIONAL HERNIA;  Surgeon: Rolm Bookbinder, MD;  Location: [place];  Service: General;  Laterality: N/A;   INSERTION OF MESH N/A 05/30/2015   Procedure: INSERTION OF MESH;  Surgeon: Rolm Bookbinder, MD;  Location: [place];  Service: General;  Laterality: N/A;   KNEE ARTHROSCOPY     right knee   LAPAROSCOPY N/A 05/30/2015   Procedure: LAPAROSCOPY DIAGNOSTIC;  Surgeon: Rolm Bookbinder, MD;  Location: [place];  Service: General;  Laterality: N/A;   PARTIAL KNEE ARTHROPLASTY Right 10/30/2017   Procedure: UNICOMPARTMENTAL RIGHT KNEE LATERAL;  Surgeon: Paralee Cancel, MD;  Location: [facility];  Service: Orthopedics;  Laterality: Right;  90 mins   PARTIAL KNEE ARTHROPLASTY Left 06/29/2020   Procedure: UNICOMPARTMENTAL KNEE LATERALLY;  Surgeon: Paralee Cancel, MD;  Location: [facility];  Service: Orthopedics;  Laterality: Left;  70 MINS   TONSILLECTOMY     adenoids    Social History   Tobacco Use   Smoking status: Never   Smokeless tobacco:  Never  Vaping Use   Vaping Use: Never used  Substance Use Topics   Alcohol use: No    Alcohol/week: 0.0 standard drinks of alcohol   Drug use: No    Family History  Problem Relation Age of Onset   Thyroid cancer Mother    Diabetes type II Mother    High Cholesterol Mother    Heart disease Mother    Diabetes type II Father    Hypertension Father    Stroke Father     Allergies  Allergen Reactions   Vancomycin Itching   Cefuroxime Axetil Hives   Sulfa Antibiotics Hives    Medication list has been reviewed and updated.  Current Outpatient Medications on File Prior to Visit  Medication Sig Dispense Refill   acetaminophen (TYLENOL) 500 MG tablet Take 1,000 mg by mouth at bedtime.     allopurinol (ZYLOPRIM) 300 MG tablet  TAKE 1 TABLET EVERY DAY 90 tablet 10   benzonatate (TESSALON) 200 MG capsule Take 1 capsule (200 mg total) by mouth 3 (three) times daily as needed for cough. 60 capsule 1   Blood Glucose Monitoring Suppl (BLOOD GLUCOSE METER KIT AND SUPPLIES) Dispense based on patient and insurance preference. To check blood sugars daily FOR ICD-9 250.00 (Patient taking differently: 1 each by Other route See admin instructions. Dispense based on patient and insurance preference. To check blood sugars daily FOR ICD-9 250.00) 1 each 0   cholestyramine (QUESTRAN) 4 g packet MIX 1 PACKET IN LIQUID AND DRINK TWICE DAILY 180 packet 3   dicyclomine (BENTYL) 20 MG tablet Take 1 tablet (20 mg total) by mouth every 8 (eight) hours as needed for spasms. 50 tablet 1   empagliflozin (JARDIANCE) 25 MG TABS tablet Take 1 tablet (25 mg total) by mouth daily. 90 tablet 3   fenofibrate micronized (LOFIBRA) 134 MG capsule Take 134 mg by mouth daily before breakfast.     FLUCELVAX QUADRIVALENT 0.5 ML injection      fluconazole (DIFLUCAN) 200 MG tablet Take 4 tablets (800 mg total) by mouth daily. 120 tablet 0   Glucose Blood (BLOOD GLUCOSE TEST STRIPS) STRP 1 each by Other route See admin  instructions. Use to test blood sugar up to twice a day 100 strip 3   glucose blood (FREESTYLE TEST STRIPS) test strip Use as directed to test blood sugar up to twice a day. Dx code E11.9 100 each 12   glucose blood (TRUE METRIX BLOOD GLUCOSE TEST) test strip Use as instructed to check glucose up to 2 times daily. Dx Code E11.9 100 each 12   levothyroxine (SYNTHROID) 150 MCG tablet Take 1 tablet (150 mcg total) by mouth daily before breakfast. 90 tablet 1   metaxalone (SKELAXIN) 800 MG tablet Take 1 tablet (800 mg total) by mouth 3 (three) times daily as needed for muscle spasms. 30 tablet 1   metFORMIN (GLUCOPHAGE) 1000 MG tablet TAKE 1 TABLET TWICE DAILY 180 tablet 3   micafungin (MYCAMINE) 100 MG SOLR injection Inject into the vein.     naproxen sodium (ALEVE) 220 MG tablet Take 440 mg by mouth daily.     norethindrone (AYGESTIN) 5 MG tablet Take 1 tablet (5 mg total) by mouth in the morning and at bedtime. 180 tablet 3   nystatin (MYCOSTATIN/NYSTOP) powder Apply 1 application. topically 3 (three) times daily. 60 g 1   ondansetron (ZOFRAN) 4 MG tablet Take 4 mg by mouth once as needed for nausea or vomiting.     ONE TOUCH ULTRA TEST test strip USE TO CHECK BLOOD SUGAR ONCE DAILY (ICD CODE:25.00) (Patient taking differently: 1 each by Other route daily.) 100 each 3   tirzepatide (MOUNJARO) 7.5 MG/0.5ML Pen 7.'5mg'$      TOUJEO SOLOSTAR 300 UNIT/ML Solostar Pen Inject 30 Units into the skin every evening.     triazolam (HALCION) 0.25 MG tablet 1-2 tabs PO 2 hours before procedure or imaging.  Do not drive with this medication. 2 tablet 0   TRUEplus Lancets 30G MISC Use as directed to check glucose up to two times daily. Dx code E11.9 100 each 3   No current facility-administered medications on file prior to visit.    Review of Systems:  As per HPI- otherwise negative.   Physical Examination: Vitals:   04/08/22 1603  BP: 122/72  Pulse: 75  Resp: 18  Temp: 97.6 F (36.4 C)  SpO2: 97%  There were no vitals filed for this visit. There is no height or weight on file to calculate BMI. Ideal Body Weight:    GEN: no acute distress.  Obese, looks her normal self HEENT: Atraumatic, Normocephalic.  Ears and Nose: No external deformity. CV: RRR, No M/G/R. No JVD. No thrill. No extra heart sounds. PULM: CTA B, no wheezes, crackles, rhonchi. No retractions. No resp. distress. No accessory muscle use. ABD: S, NT, ND, EXTR: No c/c/e PSYCH: Normally interactive. Conversant.  Right long finger - the tip of the finger shows evidence of chronic infection although it is not red or swollen.  Assessment and Plan: Controlled type 2 diabetes mellitus with complication, without long-term current use of insulin ([place]) - Plan: Hemoglobin A1c, tirzepatide (MOUNJARO) 10 MG/0.5ML Pen  Urinary incontinence, unspecified type - Plan: Urine Culture  History of frequent upper respiratory infection  Patient seen today for follow-up.  Her A1c was last checked 2 months ago, we can look at it today to see where she is.  I would also like to increase her Mounjaro from 7.5 to 10 mg, she seems to be tolerating 7.5 without a problem.  She will follow-up with her endocrinologist ASAP  Urine culture pending to rule out UTI, assuming negative we can try treating with medication for overactive bladder  Provided paper prescription for RSV vaccination which can be given after she recovers from her finger surgery  Signed Lamar Blinks, MD

## 2022-04-08 NOTE — Patient Instructions (Addendum)
It was good to see you again today- I will be in touch with your urine culture and your A1c Once you finish up the Mounjaro 7.5 we can increase to 10 mg

## 2022-04-10 ENCOUNTER — Encounter (HOSPITAL_COMMUNITY): Payer: Self-pay | Admitting: Orthopedic Surgery

## 2022-04-10 ENCOUNTER — Telehealth: Payer: Self-pay | Admitting: Family Medicine

## 2022-04-10 LAB — URINE CULTURE
MICRO NUMBER:: 14644796
SPECIMEN QUALITY:: ADEQUATE

## 2022-04-10 MED ORDER — AMOXICILLIN 500 MG PO CAPS: 500.0000 mg | ORAL_CAPSULE | Freq: Two times a day (BID) | ORAL | 0 refills | Status: DC

## 2022-04-10 NOTE — Anesthesia Preprocedure Evaluation (Addendum)
Anesthesia Evaluation  Patient identified by MRN, date of birth, ID band Patient awake    Reviewed: Allergy & Precautions, NPO status , Patient's Chart, lab work & pertinent test results  Airway Mallampati: III  TM Distance: >3 FB Neck ROM: Full    Dental no notable dental hx.    Pulmonary sleep apnea    Pulmonary exam normal        Cardiovascular negative cardio ROS Normal cardiovascular exam     Neuro/Psych  Headaches Down syndrome  Neuromuscular disease  negative psych ROS   GI/Hepatic negative GI ROS, Neg liver ROS,,,  Endo/Other  diabetes, Oral Hypoglycemic Agents, Insulin DependentHypothyroidism    Renal/GU negative Renal ROS     Musculoskeletal  (+) Arthritis ,  Fibromyalgia -  Abdominal  (+) + obese  Peds  Hematology negative hematology ROS (+)   Anesthesia Other Findings RIGHT LONG FINGER OSTEOMYELITI  Reproductive/Obstetrics Hcg negative                             Anesthesia Physical Anesthesia Plan  ASA: 3  Anesthesia Plan: MAC   Post-op Pain Management:    Induction: Intravenous  PONV Risk Score and Plan: 2 and Ondansetron, Dexamethasone, Midazolam and Treatment may vary due to age or medical condition  Airway Management Planned: Simple Face Mask  Additional Equipment:   Intra-op Plan:   Post-operative Plan:   Informed Consent: I have reviewed the patients History and Physical, chart, labs and discussed the procedure including the risks, benefits and alternatives for the proposed anesthesia with the patient or authorized representative who has indicated his/her understanding and acceptance.     Dental advisory given  Plan Discussed with: CRNA  Anesthesia Plan Comments: (PAT note written 04/10/2022 by Myra Gianotti, PA-C.  )       Anesthesia Quick Evaluation

## 2022-04-10 NOTE — Telephone Encounter (Signed)
Pt grew GBS from her urine.  Called her mom Wanda Oneill, + for BGS She can take penicillin/ amox- took most recently in 2022- tolerates fine  Rx sent to [facility] They will let me know how her urinary sx shake out after treatment and her finger amputation If sx continue we can try medication for her    Results for orders placed or performed in visit on 04/08/22  Urine Culture   Specimen: Urine  Result Value Ref Range   MICRO NUMBER: [id number]    SPECIMEN QUALITY: Adequate    Sample Source URINE    STATUS: FINAL    ISOLATE 1: Streptococcus agalactiae (A)     Meds ordered this encounter  Medications   amoxicillin (AMOXIL) 500 MG capsule    Sig: Take 1 capsule (500 mg total) by mouth 2 (two) times daily.    Dispense:  14 capsule    Refill:  0

## 2022-04-10 NOTE — Progress Notes (Signed)
PCP - Dr Silvestre Mesi Cardiologist - n/a Endocrinologist - Dr Jacelyn Pi  Chest x-ray - n/a EKG - n/a Stress Test - n/a ECHO - 05/27/19 Cardiac Cath - n/a  ICD Pacemaker/Loop - n/a  Sleep Study -  No CPAP - none  Diabetes Type 2 Do not take Jardiance, Metformin or Mounjaro on the morning of surgery.  THE NIGHT BEFORE SURGERY, take 15 units of Toujeo Insulin.        If your blood sugar is less than 70 mg/dL, you will need to treat for low blood sugar: Treat a low blood sugar (less than 70 mg/dL) with  cup of clear juice (cranberry or apple), 4 glucose tablets, OR glucose gel. Recheck blood sugar in 15 minutes after treatment (to make sure it is greater than 70 mg/dL). If your blood sugar is not greater than 70 mg/dL on recheck, call [phone] for further instructions.Marland Kitchen  ERAS: Clear liquids til 4:30 AM DOS.  Anesthesia review: Yes  STOP now taking any Aspirin (unless otherwise instructed by your surgeon), Aleve, Naproxen, Ibuprofen, Motrin, Advil, Goody's, BC's, all herbal medications, fish oil, and all vitamins.   Coronavirus Screening Positive Covid test 01/27/22 per Mother.  No current symptoms.  Selinda Eon, RN informed. Does the patient have any of the following symptoms:  Cough yes/no: No Fever (>100.22F)  yes/no: No Runny nose yes/no: No Sore throat yes/no: No Difficulty breathing/shortness of breath  yes/no: No  Has the patient traveled in the last 14 days and where? yes/no: No  Patient's Mother Alonie Cloran verbalized understanding of instructions that were given via phone.

## 2022-04-10 NOTE — Progress Notes (Addendum)
Anesthesia Chart Review: Wanda Oneill  Case: [id number] Date/Time: 04/11/22 0715   Procedure: RIGHT LONG FINGER AMPUTATION (Right)   Anesthesia type: Choice   Pre-op diagnosis: RIGHT LONG FINGER OSTEOMYELITIS   Location: [place] OR ROOM 06 / [facility]   Surgeons: Leanora Cover, MD       DISCUSSION: Patient is a 42 year old female with Down syndrome scheduled for the above procedure. She was admitted to [facility] 02/05/22-02/09/22 for right long finger flexor sheath infection, distal phalanx osteomyelitis, s/p I&D 02/06/22. Cultures + MSSA, GBS, candida glabrata with antibiotics recommendations per ID which is still following. She had follow-up with Dr. Fredna Dow on 04/03/22 with persistent open wound with inflammation at the finger tip, swelling resolved. 04/01/22 xray showed "tuft fragment in the soft tissues. There is bony lucency in the remaining portion of the distal phalanx," He discussed both operative and non-operative management but felt "amputating through the DIP joint is the most likely way to gain quick and reliable healing of this finger and allow her to resume activities."   History includes never smoker, Down Syndrome, DM2, hypothyroidism, IBS, hepatic steatosis, OSA, hidradenitis (s/p excision left axillary hidradenitis 06/20/08), cholecystectomy, hernia (s/p multiple repairs; s/p diagnostic laparoscopy with LOA, repair of epigastric hernia 05/30/15), osteoarthritis (right unicompartmental arthroplasty 10/30/17; left uni-knee 06/29/20), finger infection (s/p I&D right finger and I&D left finger paronychia with removal of nail bed 02/06/22).  Diabetes medications include Toujeo 300 units/mL 30 units Q HS, Mounjaro 7.5 mg weekly (to be increased to 10 mg), metformin 1000 mg BID, Jardiance 25 mg daily. Last Mounjajro reported as 03/30/22.   She is a same day work-up. Anesthesia team to evaluate on the day of surgery.     VS:  BP Readings from Last 3 Encounters:  04/08/22 122/72  03/18/22 127/78  03/05/22  111/73   Pulse Readings from Last 3 Encounters:  04/08/22 75  03/18/22 80  03/05/22 76     PROVIDERS: Copland, Gay Filler, MD is PCP  Jacelyn Pi, MD is endocrinologist   LABS: Most recent labs in [facility] include: Lab Results  Component Value Date   WBC 4.2 03/05/2022   HGB 14.1 03/05/2022   HCT 44.4 03/05/2022   PLT 403 (H) 03/05/2022   GLUCOSE 237 (H) 03/05/2022   ALT 17 03/05/2022   AST 34 (H) 03/05/2022   NA 138 03/05/2022   K 4.7 03/05/2022   CL 100 03/05/2022   CREATININE 0.70 03/05/2022   BUN 12 03/05/2022   CO2 22 03/05/2022   TSH 1.179 02/07/2022   HGBA1C 10.5 (H) 02/07/2022    IMAGES: MRI L-spine 03/30/22: IMPRESSION: 1. Transitional lumbosacral anatomy as above. 2. Mild multilevel disc degeneration and advanced lower lumbar facet arthrosis, mildly progressed from 2020 but without stenosis.   MRI C-spine 03/14/19: IMPRESSION: 1. C5-6 and C6-7 small noncompressive disc protrusions. 2. C5-6 facet degeneration on the left at least, with anterolisthesis. 3. Diffusely patent canal foramina.   EKG: 05/03/20: Sinus  Rhythm  Low voltage in precordial leads.   -Left axis.   -Right atrial enlargement.    CV: Echo 05/27/19: IMPRESSIONS   1. Left ventricular ejection fraction, by estimation, is 55%. The left  ventricle has normal function. The left ventricle has no regional wall  motion abnormalities. Left ventricular diastolic parameters are consistent  with Grade II diastolic dysfunction  (pseudonormalization).   2. Right ventricular systolic function is normal. The right ventricular  size is normal. There is moderately elevated pulmonary artery systolic  pressure. The estimated right ventricular systolic pressure is 99991111 mmHg.   3. The mitral valve is normal in structure. No evidence of mitral valve  regurgitation. No evidence of mitral stenosis.   4. The aortic valve is tricuspid. Aortic valve regurgitation is not  visualized. No aortic stenosis is  present.   5. The inferior vena cava is dilated in size with <50% respiratory  variability, suggesting right atrial pressure of 15 mmHg.    Past Medical History:  Diagnosis Date   Diabetes mellitus    type II   Down syndrome    Family history of adverse reaction to anesthesia    mom has had n/v   Gastritis    Headache    Hepatic steatosis    Hidradenitis    Hypothyroidism    Irritable bowel syndrome    Periumbilical hernia    Pneumonia 03/2020   frequent    Restless legs    Sleep apnea    Tendonitis    chronic in left foot   Thyroid disease    hypothyroidism    Past Surgical History:  Procedure Laterality Date   AXILLARY HIDRADENITIS EXCISION Bilateral    CHOLECYSTECTOMY     HERNIA REPAIR     multiple   INCISION AND DRAINAGE ABSCESS Right 02/06/2022   Procedure: INCISION AND DRAINAGE RIGHT HAND;  Surgeon: Leanora Cover, MD;  Location: [place];  Service: Orthopedics;  Laterality: Right;   INCISION AND DRAINAGE WOUND WITH NAILBED REPAIR Left 02/06/2022   Procedure: INCISION AND DRAINAGE LEFT INDEX FINGER;  Surgeon: Leanora Cover, MD;  Location: [place];  Service: Orthopedics;  Laterality: Left;   INCISIONAL HERNIA REPAIR  05/30/2015   LAPROSCOPIC   INCISIONAL HERNIA REPAIR N/A 05/30/2015   Procedure: LAPAROSCOPIC INCISIONAL HERNIA;  Surgeon: Rolm Bookbinder, MD;  Location: [place];  Service: General;  Laterality: N/A;   INSERTION OF MESH N/A 05/30/2015   Procedure: INSERTION OF MESH;  Surgeon: Rolm Bookbinder, MD;  Location: [place];  Service: General;  Laterality: N/A;   KNEE ARTHROSCOPY     right knee   LAPAROSCOPY N/A 05/30/2015   Procedure: LAPAROSCOPY DIAGNOSTIC;  Surgeon: Rolm Bookbinder, MD;  Location: [place];  Service: General;  Laterality: N/A;   PARTIAL KNEE ARTHROPLASTY Right 10/30/2017   Procedure: UNICOMPARTMENTAL RIGHT KNEE LATERAL;  Surgeon: Paralee Cancel, MD;  [facility];  Service: Orthopedics;  Laterality: Right;  90 mins   PARTIAL KNEE ARTHROPLASTY Left  06/29/2020   Procedure: UNICOMPARTMENTAL KNEE LATERALLY;  Surgeon: Paralee Cancel, MD;  [facility];  Service: Orthopedics;  Laterality: Left;  70 MINS   TONSILLECTOMY     adenoids    MEDICATIONS: No current facility-administered medications for this encounter.    acetaminophen (TYLENOL) 500 MG tablet   allopurinol (ZYLOPRIM) 300 MG tablet   cholestyramine (QUESTRAN) 4 g packet   ciprofloxacin (CIPRO) 500 MG tablet   dicyclomine (BENTYL) 20 MG tablet   empagliflozin (JARDIANCE) 25 MG TABS tablet   fenofibrate micronized (LOFIBRA) 134 MG capsule   fluconazole (DIFLUCAN) 200 MG tablet   ibuprofen (ADVIL) 200 MG tablet   levothyroxine (SYNTHROID) 150 MCG tablet   metaxalone (SKELAXIN) 800 MG tablet   metFORMIN (GLUCOPHAGE) 1000 MG tablet   norethindrone (AYGESTIN) 5 MG tablet   ondansetron (ZOFRAN) 8 MG tablet   tirzepatide (MOUNJARO) 7.5 MG/0.5ML Pen   TOUJEO SOLOSTAR 300 UNIT/ML Solostar Pen   benzonatate (TESSALON) 200 MG capsule   Blood Glucose Monitoring Suppl (BLOOD GLUCOSE METER KIT AND SUPPLIES)  FLUCELVAX QUADRIVALENT 0.5 ML injection   Glucose Blood (BLOOD GLUCOSE TEST STRIPS) STRP   glucose blood (FREESTYLE TEST STRIPS) test strip   glucose blood (TRUE METRIX BLOOD GLUCOSE TEST) test strip   naproxen sodium (ALEVE) 220 MG tablet   ONE TOUCH ULTRA TEST test strip   tirzepatide (MOUNJARO) 10 MG/0.5ML Pen   TRUEplus Lancets 30G MISC  Not currently taking Tessalon.    Myra Gianotti, PA-C Surgical Short Stay/Anesthesiology [facility] Phone [phone] [facility] Phone [phone] 04/10/2022 1:01 PM

## 2022-04-11 ENCOUNTER — Ambulatory Visit (HOSPITAL_COMMUNITY): Payer: Medicare HMO | Admitting: Vascular Surgery

## 2022-04-11 ENCOUNTER — Ambulatory Visit (HOSPITAL_BASED_OUTPATIENT_CLINIC_OR_DEPARTMENT_OTHER): Payer: Medicare HMO | Admitting: Vascular Surgery

## 2022-04-11 ENCOUNTER — Encounter (HOSPITAL_COMMUNITY): Payer: Self-pay | Admitting: Orthopedic Surgery

## 2022-04-11 ENCOUNTER — Other Ambulatory Visit: Payer: Self-pay

## 2022-04-11 ENCOUNTER — Encounter (HOSPITAL_COMMUNITY)
Admission: RE | Disposition: A | Discharge status: Home / Self Care | Payer: Self-pay | Source: Home / Self Care | Attending: Orthopedic Surgery

## 2022-04-11 ENCOUNTER — Ambulatory Visit (HOSPITAL_COMMUNITY)
Admission: RE | Admit: 2022-04-11 | Discharge: 2022-04-11 | Disposition: A | Discharge status: Home / Self Care | Payer: Medicare HMO | Attending: Orthopedic Surgery | Admitting: Orthopedic Surgery

## 2022-04-11 DIAGNOSIS — Z7984 Long term (current) use of oral hypoglycemic drugs: Secondary | ICD-10-CM

## 2022-04-11 DIAGNOSIS — Z794 Long term (current) use of insulin: Secondary | ICD-10-CM | POA: Diagnosis not present

## 2022-04-11 DIAGNOSIS — M797 Fibromyalgia: Secondary | ICD-10-CM | POA: Diagnosis not present

## 2022-04-11 DIAGNOSIS — S61202D Unspecified open wound of right middle finger without damage to nail, subsequent encounter: Secondary | ICD-10-CM

## 2022-04-11 DIAGNOSIS — M869 Osteomyelitis, unspecified: Secondary | ICD-10-CM | POA: Diagnosis not present

## 2022-04-11 DIAGNOSIS — Z7985 Long-term (current) use of injectable non-insulin antidiabetic drugs: Secondary | ICD-10-CM | POA: Diagnosis not present

## 2022-04-11 DIAGNOSIS — S61202A Unspecified open wound of right middle finger without damage to nail, initial encounter: Secondary | ICD-10-CM | POA: Diagnosis not present

## 2022-04-11 DIAGNOSIS — G4730 Sleep apnea, unspecified: Secondary | ICD-10-CM | POA: Insufficient documentation

## 2022-04-11 DIAGNOSIS — E1169 Type 2 diabetes mellitus with other specified complication: Secondary | ICD-10-CM | POA: Diagnosis not present

## 2022-04-11 DIAGNOSIS — Z9889 Other specified postprocedural states: Secondary | ICD-10-CM | POA: Diagnosis not present

## 2022-04-11 DIAGNOSIS — G709 Myoneural disorder, unspecified: Secondary | ICD-10-CM | POA: Insufficient documentation

## 2022-04-11 DIAGNOSIS — X58XXXA Exposure to other specified factors, initial encounter: Secondary | ICD-10-CM | POA: Insufficient documentation

## 2022-04-11 DIAGNOSIS — E039 Hypothyroidism, unspecified: Secondary | ICD-10-CM | POA: Diagnosis not present

## 2022-04-11 DIAGNOSIS — Q909 Down syndrome, unspecified: Secondary | ICD-10-CM | POA: Insufficient documentation

## 2022-04-11 DIAGNOSIS — M1990 Unspecified osteoarthritis, unspecified site: Secondary | ICD-10-CM | POA: Insufficient documentation

## 2022-04-11 DIAGNOSIS — Z6838 Body mass index (BMI) 38.0-38.9, adult: Secondary | ICD-10-CM | POA: Diagnosis not present

## 2022-04-11 DIAGNOSIS — L905 Scar conditions and fibrosis of skin: Secondary | ICD-10-CM | POA: Diagnosis not present

## 2022-04-11 DIAGNOSIS — M79644 Pain in right finger(s): Secondary | ICD-10-CM | POA: Diagnosis not present

## 2022-04-11 HISTORY — DX: Unspecified osteoarthritis, unspecified site: M19.90

## 2022-04-11 HISTORY — DX: Urinary tract infection, site not specified: N39.0

## 2022-04-11 HISTORY — DX: Sleep apnea, unspecified: G47.30

## 2022-04-11 HISTORY — DX: Dependence on other enabling machines and devices: Z99.89

## 2022-04-11 HISTORY — PX: AMPUTATION: SHX166

## 2022-04-11 LAB — GLUCOSE, CAPILLARY
Glucose-Capillary: 251 mg/dL — ABNORMAL HIGH (ref 70–99)
Glucose-Capillary: 217 mg/dL — ABNORMAL HIGH (ref 70–99)
Glucose-Capillary: 231 mg/dL — ABNORMAL HIGH (ref 70–99)
Glucose-Capillary: 214 mg/dL — ABNORMAL HIGH (ref 70–99)

## 2022-04-11 LAB — BASIC METABOLIC PANEL
Anion gap: 11 (ref 5–15)
BUN: 15 mg/dL (ref 6–20)
CO2: 23 mmol/L (ref 22–32)
Calcium: 8.9 mg/dL (ref 8.9–10.3)
Chloride: 101 mmol/L (ref 98–111)
Creatinine, Ser: 0.75 mg/dL (ref 0.44–1.00)
GFR, Estimated: >60 mL/min (ref >60)
Glucose, Bld: 227 mg/dL — ABNORMAL HIGH (ref 70–99)
Potassium: 4.3 mmol/L (ref 3.5–5.1)
Sodium: 135 mmol/L (ref 135–145)

## 2022-04-11 LAB — CBC
HCT: 41.9 % (ref 36.0–46.0)
Hemoglobin: 12.9 g/dL (ref 12.0–15.0)
MCH: 26.2 pg (ref 26.0–34.0)
MCHC: 30.8 g/dL (ref 30.0–36.0)
MCV: 85.2 fL (ref 80.0–100.0)
Platelets: 397 10*3/uL (ref 150–400)
RBC: 4.92 MIL/uL (ref 3.87–5.11)
RDW: 19.9 % — ABNORMAL HIGH (ref 11.5–15.5)
WBC: 6.1 10*3/uL (ref 4.0–10.5)
nRBC: 0 % (ref 0.0–0.2)

## 2022-04-11 LAB — POCT PREGNANCY, URINE: Preg Test, Ur: NEGATIVE

## 2022-04-11 SURGERY — AMPUTATION DIGIT
Anesthesia: Monitor Anesthesia Care | Laterality: Right

## 2022-04-11 MED ORDER — OXYCODONE HCL 5 MG/5ML PO SOLN: 5.0000 mg | Freq: Once | ORAL | Status: DC | PRN

## 2022-04-11 MED ORDER — ORAL CARE MOUTH RINSE: 15.0000 mL | Freq: Once | OROMUCOSAL | Status: AC

## 2022-04-11 MED ORDER — MIDAZOLAM HCL 2 MG/2ML IJ SOLN
INTRAMUSCULAR | Status: AC
  Filled 2022-04-11: qty 2

## 2022-04-11 MED ORDER — MIDAZOLAM HCL 2 MG/2ML IJ SOLN
INTRAMUSCULAR | Status: DC | PRN
  Administered 2022-04-11: 2 mg via INTRAVENOUS

## 2022-04-11 MED ORDER — HYDROCODONE-ACETAMINOPHEN 5-325 MG PO TABS: ORAL_TABLET | ORAL | 0 refills | Status: DC

## 2022-04-11 MED ORDER — KETOROLAC TROMETHAMINE 30 MG/ML IJ SOLN
30.0000 mg | Freq: Once | INTRAMUSCULAR | Status: AC
  Administered 2022-04-11: 30 mg via INTRAVENOUS

## 2022-04-11 MED ORDER — CHLORHEXIDINE GLUCONATE 0.12 % MT SOLN
15.0000 mL | Freq: Once | OROMUCOSAL | Status: AC
  Administered 2022-04-11: 15 mL via OROMUCOSAL
  Filled 2022-04-11: qty 15

## 2022-04-11 MED ORDER — PROPOFOL 10 MG/ML IV BOLUS
INTRAVENOUS | Status: DC | PRN
  Administered 2022-04-11: 150 mg via INTRAVENOUS

## 2022-04-11 MED ORDER — BUPIVACAINE HCL (PF) 0.25 % IJ SOLN
INTRAMUSCULAR | Status: AC
  Filled 2022-04-11: qty 30

## 2022-04-11 MED ORDER — INSULIN ASPART 100 UNIT/ML IJ SOLN
0.0000 [IU] | INTRAMUSCULAR | Status: AC | PRN
  Administered 2022-04-11: 2 [IU] via SUBCUTANEOUS

## 2022-04-11 MED ORDER — PROPOFOL 10 MG/ML IV BOLUS
INTRAVENOUS | Status: AC
  Filled 2022-04-11: qty 20

## 2022-04-11 MED ORDER — INSULIN ASPART 100 UNIT/ML IJ SOLN
INTRAMUSCULAR | Status: AC
  Administered 2022-04-11: 6 [IU] via SUBCUTANEOUS
  Filled 2022-04-11: qty 1

## 2022-04-11 MED ORDER — AMISULPRIDE (ANTIEMETIC) 5 MG/2ML IV SOLN: 10.0000 mg | Freq: Once | INTRAVENOUS | Status: DC | PRN

## 2022-04-11 MED ORDER — PROMETHAZINE HCL 25 MG/ML IJ SOLN: 6.2500 mg | INTRAMUSCULAR | Status: DC | PRN

## 2022-04-11 MED ORDER — LIDOCAINE 2% (20 MG/ML) 5 ML SYRINGE
INTRAMUSCULAR | Status: DC | PRN
  Administered 2022-04-11: 100 mg via INTRAVENOUS

## 2022-04-11 MED ORDER — FENTANYL CITRATE (PF) 250 MCG/5ML IJ SOLN
INTRAMUSCULAR | Status: DC | PRN
  Administered 2022-04-11: 50 ug via INTRAVENOUS

## 2022-04-11 MED ORDER — FENTANYL CITRATE (PF) 250 MCG/5ML IJ SOLN
INTRAMUSCULAR | Status: AC
  Filled 2022-04-11: qty 5

## 2022-04-11 MED ORDER — FENTANYL CITRATE (PF) 100 MCG/2ML IJ SOLN: 25.0000 ug | INTRAMUSCULAR | Status: DC | PRN

## 2022-04-11 MED ORDER — DEXAMETHASONE SODIUM PHOSPHATE 10 MG/ML IJ SOLN
INTRAMUSCULAR | Status: DC | PRN
  Administered 2022-04-11: 4 mg via INTRAVENOUS

## 2022-04-11 MED ORDER — ACETAMINOPHEN 10 MG/ML IV SOLN: 1000.0000 mg | Freq: Once | INTRAVENOUS | Status: DC | PRN

## 2022-04-11 MED ORDER — INSULIN ASPART 100 UNIT/ML IJ SOLN
0.0000 [IU] | INTRAMUSCULAR | Status: DC
  Administered 2022-04-11: 3 [IU] via SUBCUTANEOUS

## 2022-04-11 MED ORDER — LIDOCAINE 2% (20 MG/ML) 5 ML SYRINGE
INTRAMUSCULAR | Status: AC
  Filled 2022-04-11: qty 5

## 2022-04-11 MED ORDER — LACTATED RINGERS IV SOLN: INTRAVENOUS | Status: DC

## 2022-04-11 MED ORDER — DEXAMETHASONE SODIUM PHOSPHATE 10 MG/ML IJ SOLN
INTRAMUSCULAR | Status: AC
  Filled 2022-04-11: qty 1

## 2022-04-11 MED ORDER — ONDANSETRON HCL 4 MG/2ML IJ SOLN
INTRAMUSCULAR | Status: AC
  Filled 2022-04-11: qty 2

## 2022-04-11 MED ORDER — OXYCODONE HCL 5 MG PO TABS: 5.0000 mg | ORAL_TABLET | Freq: Once | ORAL | Status: DC | PRN

## 2022-04-11 MED ORDER — BUPIVACAINE HCL 0.25 % IJ SOLN
INTRAMUSCULAR | Status: DC | PRN
  Administered 2022-04-11: 9 mL

## 2022-04-11 MED ORDER — CLINDAMYCIN PHOSPHATE 900 MG/50ML IV SOLN
900.0000 mg | INTRAVENOUS | Status: AC
  Administered 2022-04-11: 900 mg via INTRAVENOUS
  Filled 2022-04-11: qty 50

## 2022-04-11 MED ORDER — PHENYLEPHRINE 80 MCG/ML (10ML) SYRINGE FOR IV PUSH (FOR BLOOD PRESSURE SUPPORT)
PREFILLED_SYRINGE | INTRAVENOUS | Status: DC | PRN
  Administered 2022-04-11 (×4): 160 ug via INTRAVENOUS

## 2022-04-11 MED ORDER — KETOROLAC TROMETHAMINE 30 MG/ML IJ SOLN
INTRAMUSCULAR | Status: AC
  Filled 2022-04-11: qty 1

## 2022-04-11 SURGICAL SUPPLY — 36 items
APL PRP STRL LF DISP 70% ISPRP (MISCELLANEOUS) ×1
BNDG CMPR 9X4 STRL LF SNTH (GAUZE/BANDAGES/DRESSINGS) ×1
BNDG COHESIVE 1X5 TAN STRL LF (GAUZE/BANDAGES/DRESSINGS) ×1 IMPLANT
BNDG ESMARK 4X9 LF (GAUZE/BANDAGES/DRESSINGS) ×1 IMPLANT
BNDG GAUZE DERMACEA FLUFF 4 (GAUZE/BANDAGES/DRESSINGS) IMPLANT
BNDG GZE DERMACEA 4 6PLY (GAUZE/BANDAGES/DRESSINGS)
CHLORAPREP W/TINT 26 (MISCELLANEOUS) ×1 IMPLANT
CNTNR URN SCR LID CUP LEK RST (MISCELLANEOUS) IMPLANT
CONT SPEC 4OZ STRL OR WHT (MISCELLANEOUS) ×1
CORD BIPOLAR FORCEPS 12FT (ELECTRODE) ×1 IMPLANT
CUFF TOURN SGL QUICK 18X4 (TOURNIQUET CUFF) ×1 IMPLANT
DRSG XEROFORM 1X8 (GAUZE/BANDAGES/DRESSINGS) IMPLANT
GAUZE SPONGE 4X4 12PLY STRL (GAUZE/BANDAGES/DRESSINGS) IMPLANT
GAUZE SPONGE 4X4 12PLY STRL LF (GAUZE/BANDAGES/DRESSINGS) IMPLANT
GAUZE XEROFORM 1X8 LF (GAUZE/BANDAGES/DRESSINGS) ×1 IMPLANT
GLOVE BIO SURGEON STRL SZ7.5 (GLOVE) ×1 IMPLANT
GLOVE BIOGEL PI IND STRL 8 (GLOVE) ×1 IMPLANT
GOWN STRL REUS W/ TWL LRG LVL3 (GOWN DISPOSABLE) ×1 IMPLANT
GOWN STRL REUS W/ TWL XL LVL3 (GOWN DISPOSABLE) ×1 IMPLANT
GOWN STRL REUS W/TWL LRG LVL3 (GOWN DISPOSABLE) ×2
GOWN STRL REUS W/TWL XL LVL3 (GOWN DISPOSABLE) ×1
KIT BASIN OR (CUSTOM PROCEDURE TRAY) ×1 IMPLANT
KIT TURNOVER KIT B (KITS) ×1 IMPLANT
LOOP VASCLR MAXI BLUE 18IN ST (MISCELLANEOUS) IMPLANT
LOOP VASCULAR MAXI 18 BLUE (MISCELLANEOUS) ×1
LOOPS VASCLR MAXI BLUE 18IN ST (MISCELLANEOUS) ×1 IMPLANT
NDL HYPO 25GX1X1/2 BEV (NEEDLE) IMPLANT
NEEDLE HYPO 25GX1X1/2 BEV (NEEDLE) ×1 IMPLANT
NS IRRIG 1000ML POUR BTL (IV SOLUTION) ×1 IMPLANT
PACK ORTHO EXTREMITY (CUSTOM PROCEDURE TRAY) ×1 IMPLANT
PAD ARMBOARD 7.5X6 YLW CONV (MISCELLANEOUS) ×2 IMPLANT
SUT MON AB 5-0 PS2 18 (SUTURE) IMPLANT
SYR CONTROL 10ML LL (SYRINGE) IMPLANT
TOWEL GREEN STERILE (TOWEL DISPOSABLE) ×1 IMPLANT
UNDERPAD 30X36 HEAVY ABSORB (UNDERPADS AND DIAPERS) ×1 IMPLANT
VASCULAR TIE MAXI BLUE 18IN ST (MISCELLANEOUS) ×1

## 2022-04-11 NOTE — Anesthesia Procedure Notes (Signed)
Procedure Name: LMA Insertion Date/Time: 04/11/2022 8:08 AM  Performed by: Reggie Pile, CRNAPre-anesthesia Checklist: Patient identified, Emergency Drugs available, Suction available, Patient being monitored and Timeout performed Patient Re-evaluated:Patient Re-evaluated prior to induction Oxygen Delivery Method: Circle system utilized Preoxygenation: Pre-oxygenation with 100% oxygen Induction Type: IV induction LMA: LMA inserted LMA Size: 4.0 Number of attempts: 1 Placement Confirmation: positive ETCO2 and breath sounds checked- equal and bilateral Tube secured with: Tape

## 2022-04-11 NOTE — H&P (Signed)
Wanda Oneill is an 42 y.o. female.   Chief Complaint: finger infection HPI: 42 yo female s/p I&D right long finger abscess and osteomyelitis.  Continued wound and osteomyelitis of distal phalanx.  She wishes to proceed with amputation right long finger at dip joint to try to achieve healing of the wound.  Allergies:  Allergies  Allergen Reactions   Vancomycin Itching   Cefuroxime Axetil Hives   Sulfa Antibiotics Hives    Past Medical History:  Diagnosis Date   Ambulates with cane    4 prong   Diabetes mellitus    type II   Down syndrome    Family history of adverse reaction to anesthesia    mom has had n/v   Gastritis    Headache    Hepatic steatosis    Hidradenitis    Hypothyroidism    Irritable bowel syndrome    Osteoarthritis    Periumbilical hernia    Pneumonia 03/2020   frequent    Restless legs    Sleep apnea    Tendonitis    chronic in left foot   Thyroid disease    hypothyroidism   Urinary incontinence 02/2022   some   UTI (urinary tract infection)    just dx 04/2022 - rx amoxicillin    Past Surgical History:  Procedure Laterality Date   AXILLARY HIDRADENITIS EXCISION Bilateral    CHOLECYSTECTOMY     HERNIA REPAIR     multiple   INCISION AND DRAINAGE ABSCESS Right 02/06/2022   Procedure: INCISION AND DRAINAGE RIGHT HAND;  Surgeon: Leanora Cover, MD;  Location: [facility];  Service: Orthopedics;  Laterality: Right;   INCISION AND DRAINAGE WOUND WITH NAILBED REPAIR Left 02/06/2022   Procedure: INCISION AND DRAINAGE LEFT INDEX FINGER;  Surgeon: Leanora Cover, MD;  Location: [facility];  Service: Orthopedics;  Laterality: Left;   INCISIONAL HERNIA REPAIR N/A 05/30/2015   Procedure: LAPAROSCOPIC INCISIONAL HERNIA;  Surgeon: Rolm Bookbinder, MD;  Location: [facility];  Service: General;  Laterality: N/A;   INSERTION OF MESH N/A 05/30/2015   Procedure: INSERTION OF MESH;  Surgeon: Rolm Bookbinder, MD;  Location: [facility];  Service: General;  Laterality: N/A;   KNEE  ARTHROSCOPY     right knee   LAPAROSCOPY N/A 05/30/2015   Procedure: LAPAROSCOPY DIAGNOSTIC;  Surgeon: Rolm Bookbinder, MD;  Location: [facility];  Service: General;  Laterality: N/A;   PARTIAL KNEE ARTHROPLASTY Right 10/30/2017   Procedure: UNICOMPARTMENTAL RIGHT KNEE LATERAL;  Surgeon: Paralee Cancel, MD;  Location: [facility];  Service: Orthopedics;  Laterality: Right;  90 mins   PARTIAL KNEE ARTHROPLASTY Left 06/29/2020   Procedure: UNICOMPARTMENTAL KNEE LATERALLY;  Surgeon: Paralee Cancel, MD;  Location: [facility];  Service: Orthopedics;  Laterality: Left;  56 MINS   TONSILLECTOMY     adenoids    Family History: Family History  Problem Relation Age of Onset   Thyroid cancer Mother    Diabetes type II Mother    High Cholesterol Mother    Heart disease Mother    Diabetes type II Father    Hypertension Father    Stroke Father     Social History:   reports that she has never smoked. She has never used smokeless tobacco. She reports that she does not drink alcohol and does not use drugs.  Medications: Medications Prior to Admission  Medication Sig Dispense Refill   acetaminophen (TYLENOL) 500 MG tablet Take 1,000 mg by mouth at bedtime.     allopurinol (ZYLOPRIM) 300 MG  tablet TAKE 1 TABLET EVERY DAY (Patient taking differently: Take 300 mg by mouth every evening.) 90 tablet 10   amoxicillin (AMOXIL) 500 MG capsule Take 1 capsule (500 mg total) by mouth 2 (two) times daily. 14 capsule 0   cholestyramine (QUESTRAN) 4 g packet MIX 1 PACKET IN LIQUID AND DRINK TWICE DAILY 180 packet 3   ciprofloxacin (CIPRO) 500 MG tablet Take 500 mg by mouth 2 (two) times daily.     dicyclomine (BENTYL) 20 MG tablet Take 1 tablet (20 mg total) by mouth every 8 (eight) hours as needed for spasms. (Patient taking differently: Take 40 mg by mouth every 8 (eight) hours as needed for spasms.) 50 tablet 1   empagliflozin (JARDIANCE) 25 MG TABS tablet Take 1 tablet (25 mg total) by mouth daily. 90 tablet 3    fenofibrate micronized (LOFIBRA) 134 MG capsule Take 134 mg by mouth daily before breakfast.     fluconazole (DIFLUCAN) 200 MG tablet Take 800 mg by mouth every morning.     ibuprofen (ADVIL) 200 MG tablet Take 400 mg by mouth in the morning and at bedtime.     levothyroxine (SYNTHROID) 150 MCG tablet Take 1 tablet (150 mcg total) by mouth daily before breakfast. (Patient taking differently: Take 150 mcg by mouth at bedtime.) 90 tablet 1   metaxalone (SKELAXIN) 800 MG tablet Take 1 tablet (800 mg total) by mouth 3 (three) times daily as needed for muscle spasms. 30 tablet 1   metFORMIN (GLUCOPHAGE) 1000 MG tablet TAKE 1 TABLET TWICE DAILY 180 tablet 3   norethindrone (AYGESTIN) 5 MG tablet Take 1 tablet (5 mg total) by mouth in the morning and at bedtime. (Patient taking differently: Take 5-10 mg by mouth See admin instructions. 10 mg in the morning, 5 mg in the evening) 180 tablet 3   ondansetron (ZOFRAN) 8 MG tablet Take 4 mg by mouth every 8 (eight) hours as needed for nausea or vomiting (Uses when flying).     tirzepatide [facility]) 7.5 MG/0.5ML Pen Inject 7.5 mg into the skin every Saturday.     TOUJEO SOLOSTAR 300 UNIT/ML Solostar Pen Inject 30 Units into the skin at bedtime.     benzonatate (TESSALON) 200 MG capsule Take 1 capsule (200 mg total) by mouth 3 (three) times daily as needed for cough. (Patient not taking: Reported on 04/09/2022) 60 capsule 1   Blood Glucose Monitoring Suppl (BLOOD GLUCOSE METER KIT AND SUPPLIES) Dispense based on patient and insurance preference. To check blood sugars daily FOR ICD-9 250.00 (Patient taking differently: 1 each by Other route See admin instructions. Dispense based on patient and insurance preference. To check blood sugars daily FOR ICD-9 250.00) 1 each 0   FLUCELVAX QUADRIVALENT 0.5 ML injection      Glucose Blood (BLOOD GLUCOSE TEST STRIPS) STRP 1 each by Other route See admin instructions. Use to test blood sugar up to twice a day 100 strip 3   glucose  blood (FREESTYLE TEST STRIPS) test strip Use as directed to test blood sugar up to twice a day. Dx code E11.9 100 each 12   glucose blood (TRUE METRIX BLOOD GLUCOSE TEST) test strip Use as instructed to check glucose up to 2 times daily. Dx Code E11.9 100 each 12   naproxen sodium (ALEVE) 220 MG tablet Take 440 mg by mouth daily.     ONE TOUCH ULTRA TEST test strip USE TO CHECK BLOOD SUGAR ONCE DAILY (ICD CODE:25.00) (Patient taking differently: 1 each by Other route  daily.) 100 each 3   tirzepatide (MOUNJARO) 10 MG/0.5ML Pen Inject 10 mg into the skin once a week. 6 mL 3   TRUEplus Lancets 30G MISC Use as directed to check glucose up to two times daily. Dx code E11.9 100 each 3    Results for orders placed or performed during the hospital encounter of 04/11/22 (from the past 48 hour(s))  Basic metabolic panel per protocol     Status: Abnormal   Collection Time: 04/11/22  6:02 AM  Result Value Ref Range   Sodium 135 135 - 145 mmol/L   Potassium 4.3 3.5 - 5.1 mmol/L   Chloride 101 98 - 111 mmol/L   CO2 23 22 - 32 mmol/L   Glucose, Bld 227 (H) 70 - 99 mg/dL    Comment: Glucose reference range applies only to samples taken after fasting for at least 8 hours.   BUN 15 6 - 20 mg/dL   Creatinine, Ser 0.75 0.44 - 1.00 mg/dL   Calcium 8.9 8.9 - 10.3 mg/dL   GFR, Estimated >60 >60 mL/min    Comment: (NOTE) Calculated using the CKD-EPI Creatinine Equation (2021)    Anion gap 11 5 - 15    Comment: Performed at [facility] [address] [place], [place] [address]  CBC per protocol     Status: Abnormal   Collection Time: 04/11/22  6:02 AM  Result Value Ref Range   WBC 6.1 4.0 - 10.5 K/uL   RBC 4.92 3.87 - 5.11 MIL/uL   Hemoglobin 12.9 12.0 - 15.0 g/dL   HCT 41.9 36.0 - 46.0 %   MCV 85.2 80.0 - 100.0 fL   MCH 26.2 26.0 - 34.0 pg   MCHC 30.8 30.0 - 36.0 g/dL   RDW 19.9 (H) 11.5 - 15.5 %   Platelets 397 150 - 400 K/uL   nRBC 0.0 0.0 - 0.2 %    Comment: Performed at [facility], [place] [address], [place], [place] [address]  Glucose, capillary     Status: Abnormal   Collection Time: 04/11/22  6:24 AM  Result Value Ref Range   Glucose-Capillary 251 (H) 70 - 99 mg/dL    Comment: Glucose reference range applies only to samples taken after fasting for at least 8 hours.  Pregnancy, urine POC     Status: None   Collection Time: 04/11/22  6:36 AM  Result Value Ref Range   Preg Test, Ur NEGATIVE NEGATIVE    Comment:        THE SENSITIVITY OF THIS METHODOLOGY IS >24 mIU/mL     No results found.    Blood pressure 100/65, pulse 78, resp. rate 16, height '5\' 4"'$  (1.626 m), weight 103.1 kg, SpO2 97 %.  General appearance: alert, cooperative, and appears stated age Head: Normocephalic, without obvious abnormality, atraumatic Neck: supple, symmetrical, trachea midline Extremities: Intact sensation and capillary refill all digits.  +epl/fpl/io.  Wound at end of right long finger Pulses: 2+ and symmetric Skin: Skin color, texture, turgor normal. No rashes or lesions Neurologic: Grossly normal Incision/Wound: as above  Assessment/Plan Right long finger distal phalanx osteomyelitis and wound.  Plan amputation at dip joint.  Non operative and operative treatment options have been discussed with the patient and patient wishes to proceed with operative treatment. Risks, benefits, and alternatives of surgery have been discussed and the patient agrees with the plan of care.   Leanora Cover 04/11/2022, 7:39 AM

## 2022-04-11 NOTE — Anesthesia Postprocedure Evaluation (Signed)
Anesthesia Post Note  Patient: Wanda Oneill  Procedure(s) Performed: RIGHT LONG FINGER AMPUTATION (Right)     Patient location during evaluation: PACU Anesthesia Type: General Level of consciousness: awake Pain management: pain level controlled Vital Signs Assessment: post-procedure vital signs reviewed and stable Respiratory status: spontaneous breathing, nonlabored ventilation and respiratory function stable Cardiovascular status: blood pressure returned to baseline and stable Postop Assessment: no apparent nausea or vomiting Anesthetic complications: no   No notable events documented.  Last Vitals:  Vitals:   04/11/22 1030 04/11/22 1045  BP: 120/89 123/87  Pulse: 79 82  Resp: 20 19  Temp:  37 C  SpO2: 95% 96%    Last Pain:  Vitals:   04/11/22 0930  TempSrc:   PainSc: 0-No pain                 Meriam Chojnowski P Fabiha Rougeau

## 2022-04-11 NOTE — Op Note (Addendum)
NAME: Wanda Oneill MEDICAL RECORD NO: [id number] DATE OF BIRTH: [birth year] FACILITY: [facility] LOCATION: [facility] OR PHYSICIAN: Tennis Must, MD   OPERATIVE REPORT   DATE OF PROCEDURE: 04/11/22    PREOPERATIVE DIAGNOSIS: Right long finger osteomyelitis and chronic wound   POSTOPERATIVE DIAGNOSIS: Right long finger distal phalanx osteomyelitis and chronic wound   PROCEDURE: Right long finger amputation through distal phalanx   SURGEON:  Leanora Cover, M.D.   ASSISTANT: none   ANESTHESIA:  General   INTRAVENOUS FLUIDS:  Per anesthesia flow sheet.   ESTIMATED BLOOD LOSS:  Minimal.   COMPLICATIONS:  None.   SPECIMENS: Tissue cultures to micro   TOURNIQUET TIME:    Total Tourniquet Time Documented: Upper Arm (Right) - 30 minutes Total: Upper Arm (Right) - 30 minutes    DISPOSITION:  Stable to PACU.   INDICATIONS: 42 year old female status post incision and drainage right long finger foreign infection.  She has a osteomyelitis of the distal phalanx.  She wishes to proceed with amputation of the finger to achieve healing.  Risks, benefits and alternatives of surgery were discussed including the risks of blood loss, infection, damage to nerves, vessels, tendons, ligaments, bone for surgery, need for additional surgery, complications with wound healing, continued pain, stiffness, , need for repeat irrigation and debridement.  She voiced understanding of these risks and elected to proceed.  OPERATIVE COURSE:  After being identified preoperatively by myself,  the patient and I agreed on the procedure and site of the procedure.  The surgical site was marked.  Surgical consent had been signed. Preoperative IV antibiotic prophylaxis was given. She was transferred to the operating room and placed on the operating table in supine position with the Right upper extremity on an arm board.  General anesthesia was induced by the anesthesiologist.  Right upper extremity was prepped and draped in  normal sterile orthopedic fashion.  A surgical pause was performed between the surgeons, anesthesia, and operating room staff and all were in agreement as to the patient, procedure, and site of procedure.  Tourniquet at the proximal aspect of the extremity was inflated to 250 mmHg after exsanguination of the arm with an Esmarch bandage.  Digital block was performed with quarter percent plain Marcaine to aid in postoperative analgesia.  There was a wound at the tip of the finger with serous drainage.  Incision was made adjacent to this wound and a fishmouth style.  The portion of devitalized distal phalanx tuft was removed.  There is no gross purulence.  The soft tissues were mobilized.  The distal phalanx was visualized.  The bone itself was firm and appeared viable.  The distal aspect of the distal phalanx was removed to remove the spike of bone back to a firm bed of bone.  It was felt that amputation through the DIP joint was not necessary.  The nailbed however was unsupported.  The nail was removed with a freer elevator and the nailbed and germinal matrix removed sharply with a knife.  The wound was then copiously irrigated with sterile saline.  The edges of the chronic portion of wound were sharply removed with a knife.  The skin edges were reapproximated with 5-0 Monocryl suture.  There was no undue tension.  A vessel loop drain was placed.  There is good overall contour to the fingertip.  The wound was dressed with sterile Xeroform 4 x 4 and wrapped with a Coban dressing lightly.  An AlumaFoam splint was placed and wrapped lightly with  Coban dressing.  The tourniquet was deflated at 30 minutes.  Fingertips were pink with brisk capillary refill after deflation of tourniquet.  The operative  drapes were broken down.  The patient was awoken from anesthesia safely.  She was transferred back to the stretcher and taken to PACU in stable condition.  I will see her back in the office in 1 week for postoperative  followup.  I will give her a prescription for Norco 5/325 1-2 tabs PO q6 hours prn pain, dispense # 15.  She is currently on oral antibiotics.   Leanora Cover, MD Electronically signed, 04/11/22

## 2022-04-11 NOTE — Transfer of Care (Signed)
Immediate Anesthesia Transfer of Care Note  Patient: Wanda Oneill  Procedure(s) Performed: RIGHT LONG FINGER AMPUTATION (Right)  Patient Location: PACU  Anesthesia Type:General  Level of Consciousness: sedated  Airway & Oxygen Therapy: Patient Spontanous Breathing  Post-op Assessment: Report given to RN  Post vital signs: Reviewed  Last Vitals:  Vitals Value Taken Time  BP 118/81 04/11/22 0900  Temp    Pulse 76 04/11/22 0902  Resp 24 04/11/22 0902  SpO2 99 % 04/11/22 0902  Vitals shown include unvalidated device data.  Last Pain:  Vitals:   04/11/22 0644  TempSrc:   PainSc: 4          Complications: No notable events documented.

## 2022-04-11 NOTE — Discharge Instructions (Signed)

## 2022-04-12 ENCOUNTER — Encounter (HOSPITAL_COMMUNITY): Payer: Self-pay | Admitting: Orthopedic Surgery

## 2022-04-12 LAB — SURGICAL PATHOLOGY

## 2022-04-15 ENCOUNTER — Ambulatory Visit: Payer: Medicare HMO | Admitting: Internal Medicine

## 2022-04-16 LAB — AEROBIC/ANAEROBIC CULTURE W GRAM STAIN (SURGICAL/DEEP WOUND)
Culture: NO GROWTH
Gram Stain: NONE SEEN

## 2022-04-18 ENCOUNTER — Telehealth: Payer: Self-pay | Admitting: Family Medicine

## 2022-04-18 MED ORDER — DOXYCYCLINE HYCLATE 100 MG PO CAPS: 100.0000 mg | ORAL_CAPSULE | Freq: Two times a day (BID) | ORAL | 0 refills | Status: DC

## 2022-04-18 NOTE — Telephone Encounter (Signed)
Call back to speak with Wanda Oneill- concern of an infection around a toenail with some redness and a small amount of pus  They note Wanda Oneill is no longer on any antibiotics from her recent finger infection She did very recently take amoxicillin for UTI so likely amoxicillin may not be effective for this toe infection-also would like to cover for MRSA  Will have her start on doxycycline 100 mg twice daily-her mom notes Wanda Oneill can typically only tolerate doxycycline twice daily for 2 or 3 days, then she will typically switch to once a day-advised that is okay if what she can do  Make an appoint with my partner Jodi Mourning tomorrow at 1:40 so we can look at this toe and hopefully culture any pus that is present However given her history of infections will have her go ahead and start doxy now instead of waiting until after her culture   Meds ordered this encounter  Medications   doxycycline (VIBRAMYCIN) 100 MG capsule    Sig: Take 1 capsule (100 mg total) by mouth 2 (two) times daily.    Dispense:  20 capsule    Refill:  0

## 2022-04-18 NOTE — Telephone Encounter (Signed)
Received a message from parent at [facility] seems to have an ingrown and infected toenail

## 2022-04-19 ENCOUNTER — Ambulatory Visit (INDEPENDENT_AMBULATORY_CARE_PROVIDER_SITE_OTHER): Payer: Medicare HMO | Admitting: Family

## 2022-04-19 ENCOUNTER — Encounter: Payer: Self-pay | Admitting: Family

## 2022-04-19 VITALS — BP 118/64 | HR 79 | Resp 18 | Ht 64.0 in | Wt 227.0 lb

## 2022-04-19 DIAGNOSIS — L089 Local infection of the skin and subcutaneous tissue, unspecified: Secondary | ICD-10-CM | POA: Diagnosis not present

## 2022-04-19 NOTE — Progress Notes (Signed)
Wanda Oneill is a 42 y.o. female with the following history as recorded in EpicCare:  Patient Active Problem List   Diagnosis Date Noted   Furuncles 02/08/2022   Infection of flexor tendon sheath 02/06/2022   Finger osteomyelitis, right ([place]) 02/06/2022   Leukocytosis 02/06/2022   History of COVID-19 02/06/2022   IBS (irritable bowel syndrome) 02/06/2022   Hypertriglyceridemia 02/06/2022   Obesity, Class III, BMI 40-49.9 (morbid obesity) ([place]) 02/06/2022   Chronic right-sided low back pain 11/02/2021   Bilateral trigger thumb 06/18/2021   Chronic paronychia of finger 01/25/2021   S/P left unicompartmental knee replacement, lateral 06/29/2020   At risk for obstructive sleep apnea 06/11/2019   Cough 06/11/2019   Hyperglycemia 05/26/2019   Acute hypoxemic respiratory failure ([place]) 05/20/2019   Fibromyalgia 03/09/2019   Neck pain, chronic 03/09/2019   Acute shoulder bursitis, right 02/03/2019   Bile salt-induced diarrhea 09/25/2018   Arthritis of left acromioclavicular joint 09/23/2018   Precordial chest pain 08/11/2018   Morbid obesity ([place]) 10/31/2017   S/P right UKR, lateral 10/30/2017   Incarcerated epigastric hernia 05/30/2015   Left foot pain 08/09/2014   Chronic diarrhea 07/27/2014   Gout 08/10/2012   Acid reflux 01/22/2012   Down syndrome 11/11/2011   Hernia of abdominal wall 10/21/2011   Psoriasis 10/21/2011   Uncontrolled type 2 diabetes mellitus with hyperglycemia, with long-term current use of insulin ([place]) 04/12/2011   Hypothyroid 04/12/2011   Gait abnormality 06/22/2010   Tibial tendinitis, posterior 06/22/2010    Current Outpatient Medications  Medication Sig Dispense Refill   allopurinol (ZYLOPRIM) 300 MG tablet TAKE 1 TABLET EVERY DAY (Patient taking differently: Take 300 mg by mouth every evening.) 90 tablet 10   Blood Glucose Monitoring Suppl (BLOOD GLUCOSE METER KIT AND SUPPLIES) Dispense based on patient and insurance preference. To check blood sugars  daily FOR ICD-9 250.00 (Patient taking differently: 1 each by Other route See admin instructions. Dispense based on patient and insurance preference. To check blood sugars daily FOR ICD-9 250.00) 1 each 0   cholestyramine (QUESTRAN) 4 g packet MIX 1 PACKET IN LIQUID AND DRINK TWICE DAILY 180 packet 3   ciprofloxacin (CIPRO) 500 MG tablet Take 500 mg by mouth 2 (two) times daily.     dicyclomine (BENTYL) 20 MG tablet Take 1 tablet (20 mg total) by mouth every 8 (eight) hours as needed for spasms. (Patient taking differently: Take 40 mg by mouth every 8 (eight) hours as needed for spasms.) 50 tablet 1   doxycycline (VIBRAMYCIN) 100 MG capsule Take 1 capsule (100 mg total) by mouth 2 (two) times daily. 20 capsule 0   empagliflozin (JARDIANCE) 25 MG TABS tablet Take 1 tablet (25 mg total) by mouth daily. 90 tablet 3   fenofibrate micronized (LOFIBRA) 134 MG capsule Take 134 mg by mouth daily before breakfast.     FLUCELVAX QUADRIVALENT 0.5 ML injection      fluconazole (DIFLUCAN) 200 MG tablet Take 800 mg by mouth every morning.     Glucose Blood (BLOOD GLUCOSE TEST STRIPS) STRP 1 each by Other route See admin instructions. Use to test blood sugar up to twice a day 100 strip 3   glucose blood (FREESTYLE TEST STRIPS) test strip Use as directed to test blood sugar up to twice a day. Dx code E11.9 100 each 12   glucose blood (TRUE METRIX BLOOD GLUCOSE TEST) test strip Use as instructed to check glucose up to 2 times daily. Dx Code E11.9 100 each 12  ibuprofen (ADVIL) 200 MG tablet Take 400 mg by mouth in the morning and at bedtime.     levothyroxine (SYNTHROID) 150 MCG tablet Take 1 tablet (150 mcg total) by mouth daily before breakfast. (Patient taking differently: Take 150 mcg by mouth at bedtime.) 90 tablet 1   metaxalone (SKELAXIN) 800 MG tablet Take 1 tablet (800 mg total) by mouth 3 (three) times daily as needed for muscle spasms. 30 tablet 1   metFORMIN (GLUCOPHAGE) 1000 MG tablet TAKE 1 TABLET TWICE  DAILY 180 tablet 3   naproxen sodium (ALEVE) 220 MG tablet Take 440 mg by mouth daily.     norethindrone (AYGESTIN) 5 MG tablet Take 1 tablet (5 mg total) by mouth in the morning and at bedtime. (Patient taking differently: Take 5-10 mg by mouth See admin instructions. 10 mg in the morning, 5 mg in the evening) 180 tablet 3   ondansetron (ZOFRAN) 8 MG tablet Take 4 mg by mouth every 8 (eight) hours as needed for nausea or vomiting (Uses when flying).     ONE TOUCH ULTRA TEST test strip USE TO CHECK BLOOD SUGAR ONCE DAILY (ICD CODE:25.00) (Patient taking differently: 1 each by Other route daily.) 100 each 3   tirzepatide (MOUNJARO) 10 MG/0.5ML Pen Inject 10 mg into the skin once a week. 6 mL 3   tirzepatide (MOUNJARO) 7.5 MG/0.5ML Pen Inject 7.5 mg into the skin every Saturday.     TOUJEO SOLOSTAR 300 UNIT/ML Solostar Pen Inject 30 Units into the skin at bedtime.     TRUEplus Lancets 30G MISC Use as directed to check glucose up to two times daily. Dx code E11.9 100 each 3   benzonatate (TESSALON) 200 MG capsule Take 1 capsule (200 mg total) by mouth 3 (three) times daily as needed for cough. (Patient not taking: Reported on 04/09/2022) 60 capsule 1   HYDROcodone-acetaminophen (NORCO/VICODIN) 5-325 MG tablet 1-2 tabs PO q6 hours prn pain (Patient not taking: Reported on 04/19/2022) 15 tablet 0   No current facility-administered medications for this visit.    Allergies: Vancomycin, Cefuroxime axetil, and Sulfa antibiotics  Past Medical History:  Diagnosis Date   Ambulates with cane    4 prong   Diabetes mellitus    type II   Down syndrome    Family history of adverse reaction to anesthesia    mom has had n/v   Gastritis    Headache    Hepatic steatosis    Hidradenitis    Hypothyroidism    Irritable bowel syndrome    Osteoarthritis    Periumbilical hernia    Pneumonia 03/2020   frequent    Restless legs    Sleep apnea    Tendonitis    chronic in left foot   Thyroid disease     hypothyroidism   Urinary incontinence 02/2022   some   UTI (urinary tract infection)    just dx 04/2022 - rx amoxicillin    Past Surgical History:  Procedure Laterality Date   AMPUTATION Right 04/11/2022   Procedure: RIGHT LONG FINGER AMPUTATION;  Surgeon: Leanora Cover, MD;  Location: [facility];  Service: Orthopedics;  Laterality: Right;   AXILLARY HIDRADENITIS EXCISION Bilateral    CHOLECYSTECTOMY     HERNIA REPAIR     multiple   INCISION AND DRAINAGE ABSCESS Right 02/06/2022   Procedure: INCISION AND DRAINAGE RIGHT HAND;  Surgeon: Leanora Cover, MD;  Location: [facility];  Service: Orthopedics;  Laterality: Right;   INCISION AND DRAINAGE WOUND WITH NAILBED  REPAIR Left 02/06/2022   Procedure: INCISION AND DRAINAGE LEFT INDEX FINGER;  Surgeon: Leanora Cover, MD;  Location: [facility];  Service: Orthopedics;  Laterality: Left;   INCISIONAL HERNIA REPAIR N/A 05/30/2015   Procedure: LAPAROSCOPIC INCISIONAL HERNIA;  Surgeon: Rolm Bookbinder, MD;  Location: [facility];  Service: General;  Laterality: N/A;   INSERTION OF MESH N/A 05/30/2015   Procedure: INSERTION OF MESH;  Surgeon: Rolm Bookbinder, MD;  Location: [facility];  Service: General;  Laterality: N/A;   KNEE ARTHROSCOPY     right knee   LAPAROSCOPY N/A 05/30/2015   Procedure: LAPAROSCOPY DIAGNOSTIC;  Surgeon: Rolm Bookbinder, MD;  Location: [facility];  Service: General;  Laterality: N/A;   PARTIAL KNEE ARTHROPLASTY Right 10/30/2017   Procedure: UNICOMPARTMENTAL RIGHT KNEE LATERAL;  Surgeon: Paralee Cancel, MD;  Location: [facility];  Service: Orthopedics;  Laterality: Right;  90 mins   PARTIAL KNEE ARTHROPLASTY Left 06/29/2020   Procedure: UNICOMPARTMENTAL KNEE LATERALLY;  Surgeon: Paralee Cancel, MD;  Location: [facility];  Service: Orthopedics;  Laterality: Left;  74 MINS   TONSILLECTOMY     adenoids    Family History  Problem Relation Age of Onset   Thyroid cancer Mother    Diabetes type II Mother    High Cholesterol Mother    Heart disease Mother     Diabetes type II Father    Hypertension Father    Stroke Father     Social History   Tobacco Use   Smoking status: Never   Smokeless tobacco: Never  Substance Use Topics   Alcohol use: No    Alcohol/week: 0.0 standard drinks of alcohol    Subjective:   Was asked to see this patient by her PCP, Dr. Lorelei Pont with concerns for persisting toenail infection; they opted to start Doxycycline yesterday and bring her in for culture today; no other questions or concerns;   Objective:  Vitals:   04/19/22 1344  BP: 118/64  Pulse: 79  Resp: 18  SpO2: 100%  Weight: 227 lb (103 kg)  Height: 5\' 4"  (1.626 m)    General: Well developed, well nourished, in no acute distress  Skin : Warm and dry. Pustular drainage noted on medial side of left toenail; marked erythema noted over tip of toenail- no streaking noted down into foot;  Head: Normocephalic and atraumatic  Eyes: Sclera and conjunctiva clear; pupils round and reactive to light; extraocular movements intact  Ears: External normal; canals clear; tympanic membranes normal  Oropharynx: Pink, supple. No suspicious lesions  Neck: Supple without thyromegaly, adenopathy  Lungs: Respirations unlabored;  Neurologic: Alert and oriented; speech intact; face symmetrical; moves all extremities well; CNII-XII intact without focal deficit   Assessment:  1. Toe infection     Plan:  Wound culture updated; continue Doxycyline as prescribed by her PCP; follow up with her PCP as previously discussed- may need to consider wound specialist.   No follow-ups on file.  Orders Placed This Encounter  Procedures   Wound culture    Order Specific Question:   Source    Answer:   left 1st toe/ medial toenail    Requested Prescriptions    No prescriptions requested or ordered in this encounter

## 2022-04-22 LAB — WOUND CULTURE
MICRO NUMBER:: 14698567
SPECIMEN QUALITY:: ADEQUATE

## 2022-04-23 ENCOUNTER — Other Ambulatory Visit: Payer: Self-pay

## 2022-04-23 ENCOUNTER — Encounter: Payer: Self-pay | Admitting: Internal Medicine

## 2022-04-23 ENCOUNTER — Ambulatory Visit (INDEPENDENT_AMBULATORY_CARE_PROVIDER_SITE_OTHER): Payer: Medicare HMO | Admitting: Internal Medicine

## 2022-04-23 VITALS — BP 107/68 | HR 82 | Resp 16 | Ht 64.0 in | Wt 225.0 lb

## 2022-04-23 DIAGNOSIS — L03032 Cellulitis of left toe: Secondary | ICD-10-CM | POA: Diagnosis not present

## 2022-04-23 DIAGNOSIS — M869 Osteomyelitis, unspecified: Secondary | ICD-10-CM | POA: Diagnosis not present

## 2022-04-23 MED ORDER — ONDANSETRON HCL 4 MG PO TABS: 4.0000 mg | ORAL_TABLET | Freq: Three times a day (TID) | ORAL | 0 refills | Status: AC | PRN

## 2022-04-23 NOTE — Progress Notes (Signed)
Patient ID: Wanda Oneill, female   DOB: 1981-01-02, 42 y.o.   MRN: [id number]  HPI   42 year old female with T2DM who is status post incision and drainage right long finger foreign infection complicated by osteomyelitis of the distal phalanx, treated for polymicrobial infection including c.glabrata in January 2024.  Has been on high dose fluconazole 800mg  daily. She underwent amputation of the right finger at distal phalynx to achieve healing on 04/11/22, repeat tissue cx were NGTD. Healing well on follow up with visit on 3/14. However, she started to have left great toe nail infection/parynochia in mid march started on doxycycline by her pcp, dr copland, on march 14th. Cultures showing skin flora.she is in [facility] clinic for evaluation and treatment recs.  Outpatient Encounter Medications as of 04/23/2022  Medication Sig   allopurinol (ZYLOPRIM) 300 MG tablet TAKE 1 TABLET EVERY DAY (Patient not taking: Reported on 04/23/2022)   benzonatate (TESSALON) 200 MG capsule Take 1 capsule (200 mg total) by mouth 3 (three) times daily as needed for cough. (Patient not taking: Reported on 04/09/2022)   Blood Glucose Monitoring Suppl (BLOOD GLUCOSE METER KIT AND SUPPLIES) Dispense based on patient and insurance preference. To check blood sugars daily FOR ICD-9 250.00 (Patient taking differently: 1 each by Other route See admin instructions. Dispense based on patient and insurance preference. To check blood sugars daily FOR ICD-9 250.00)   cholestyramine (QUESTRAN) 4 g packet MIX 1 PACKET IN LIQUID AND DRINK TWICE DAILY   ciprofloxacin (CIPRO) 500 MG tablet Take 500 mg by mouth 2 (two) times daily.   dicyclomine (BENTYL) 20 MG tablet Take 1 tablet (20 mg total) by mouth every 8 (eight) hours as needed for spasms. (Patient taking differently: Take 40 mg by mouth every 8 (eight) hours as needed for spasms.)   doxycycline (VIBRAMYCIN) 100 MG capsule Take 1 capsule (100 mg total) by mouth 2 (two) times daily.    empagliflozin (JARDIANCE) 25 MG TABS tablet Take 1 tablet (25 mg total) by mouth daily.   fenofibrate micronized (LOFIBRA) 134 MG capsule Take 134 mg by mouth daily before breakfast.   FLUCELVAX QUADRIVALENT 0.5 ML injection    fluconazole (DIFLUCAN) 200 MG tablet Take 800 mg by mouth every morning.   Glucose Blood (BLOOD GLUCOSE TEST STRIPS) STRP 1 each by Other route See admin instructions. Use to test blood sugar up to twice a day   glucose blood (FREESTYLE TEST STRIPS) test strip Use as directed to test blood sugar up to twice a day. Dx code E11.9   glucose blood (TRUE METRIX BLOOD GLUCOSE TEST) test strip Use as instructed to check glucose up to 2 times daily. Dx Code E11.9   HYDROcodone-acetaminophen (NORCO/VICODIN) 5-325 MG tablet 1-2 tabs PO q6 hours prn pain (Patient not taking: Reported on 04/19/2022)   ibuprofen (ADVIL) 200 MG tablet Take 400 mg by mouth in the morning and at bedtime.   levothyroxine (SYNTHROID) 150 MCG tablet Take 1 tablet (150 mcg total) by mouth daily before breakfast. (Patient taking differently: Take 150 mcg by mouth at bedtime.)   metaxalone (SKELAXIN) 800 MG tablet Take 1 tablet (800 mg total) by mouth 3 (three) times daily as needed for muscle spasms.   metFORMIN (GLUCOPHAGE) 1000 MG tablet TAKE 1 TABLET TWICE DAILY   naproxen sodium (ALEVE) 220 MG tablet Take 440 mg by mouth daily.   norethindrone (AYGESTIN) 5 MG tablet Take 1 tablet (5 mg total) by mouth in the morning and at bedtime. (Patient taking  differently: Take 5-10 mg by mouth See admin instructions. 10 mg in the morning, 5 mg in the evening)   ondansetron (ZOFRAN) 8 MG tablet Take 4 mg by mouth every 8 (eight) hours as needed for nausea or vomiting (Uses when flying).   ONE TOUCH ULTRA TEST test strip USE TO CHECK BLOOD SUGAR ONCE DAILY (ICD CODE:25.00) (Patient taking differently: 1 each by Other route daily.)   tirzepatide (MOUNJARO) 10 MG/0.5ML Pen Inject 10 mg into the skin once a week.   tirzepatide  [facility]) 7.5 MG/0.5ML Pen Inject 7.5 mg into the skin every Saturday.   TOUJEO SOLOSTAR 300 UNIT/ML Solostar Pen Inject 30 Units into the skin at bedtime.   TRUEplus Lancets 30G MISC Use as directed to check glucose up to two times daily. Dx code E11.9   No facility-administered encounter medications on file as of 04/23/2022.     Patient Active Problem List   Diagnosis Date Noted   Furuncles 02/08/2022   Infection of flexor tendon sheath 02/06/2022   Finger osteomyelitis, right ([place]) 02/06/2022   Leukocytosis 02/06/2022   History of COVID-19 02/06/2022   IBS (irritable bowel syndrome) 02/06/2022   Hypertriglyceridemia 02/06/2022   Obesity, Class III, BMI 40-49.9 (morbid obesity) ([place]) 02/06/2022   Chronic right-sided low back pain 11/02/2021   Bilateral trigger thumb 06/18/2021   Chronic paronychia of finger 01/25/2021   S/P left unicompartmental knee replacement, lateral 06/29/2020   At risk for obstructive sleep apnea 06/11/2019   Cough 06/11/2019   Hyperglycemia 05/26/2019   Acute hypoxemic respiratory failure ([place]) 05/20/2019   Fibromyalgia 03/09/2019   Neck pain, chronic 03/09/2019   Acute shoulder bursitis, right 02/03/2019   Bile salt-induced diarrhea 09/25/2018   Arthritis of left acromioclavicular joint 09/23/2018   Precordial chest pain 08/11/2018   Morbid obesity ([place]) 10/31/2017   S/P right UKR, lateral 10/30/2017   Incarcerated epigastric hernia 05/30/2015   Left foot pain 08/09/2014   Chronic diarrhea 07/27/2014   Gout 08/10/2012   Acid reflux 01/22/2012   Down syndrome 11/11/2011   Hernia of abdominal wall 10/21/2011   Psoriasis 10/21/2011   Uncontrolled type 2 diabetes mellitus with hyperglycemia, with long-term current use of insulin ([place]) 04/12/2011   Hypothyroid 04/12/2011   Gait abnormality 06/22/2010   Tibial tendinitis, posterior 06/22/2010     Health Maintenance Due  Topic Date Due   Hepatitis C Screening  Never done   OPHTHALMOLOGY EXAM   06/05/2019   COVID-19 Vaccine (4 - 2023-24 season) 10/05/2021   FOOT EXAM  12/20/2021     Review of Systems  Physical Exam   Resp 16   Ht 5\' 4"  (1.626 m)   Wt 225 lb (102.1 kg)   LMP  (LMP Unknown)   BMI 38.62 kg/m   [id number] middle digit well healed incision Skin = erythema about nailbed  CBC Lab Results  Component Value Date   WBC 6.1 04/11/2022   RBC 4.92 04/11/2022   HGB 12.9 04/11/2022   HCT 41.9 04/11/2022   PLT 397 04/11/2022   MCV 85.2 04/11/2022   MCH 26.2 04/11/2022   MCHC 30.8 04/11/2022   RDW 19.9 (H) 04/11/2022   LYMPHSABS 2.0 02/05/2022   MONOABS 1.0 02/05/2022   EOSABS 0.1 02/05/2022    BMET Lab Results  Component Value Date   NA 135 04/11/2022   K 4.3 04/11/2022   CL 101 04/11/2022   CO2 23 04/11/2022   GLUCOSE 227 (H) 04/11/2022   BUN 15 04/11/2022   CREATININE 0.75 04/11/2022  CALCIUM 8.9 04/11/2022   GFRNONAA >60 04/11/2022   GFRAA >60 06/01/2019      Assessment and Plan   Osteomyelitis of right middle digit = now has source control. Continue with local wound dressing changes and follow up with hand surgeon. No further abtx needed for osteomyelitis  Parynochia of left great toe = continue on doxycycline 100mg  po bid as dr copland has prescribe. Would also continue with fluconazole 400mg  daily for the same time frame, then stop  Return if not improving.  Abtx side effect = will give a short course of zofran to use if needed for doxy

## 2022-04-23 NOTE — Patient Instructions (Signed)
Continue with doxycycline 100mg  po bid as dr copland as prescribe. Add fluconazole 400mg  daily for the same time period. Then can stop  Can use zofran if needed for nausea  If you get yeast infection, can treat with 1 dose of fluconazole 200mg  po x 1

## 2022-04-25 ENCOUNTER — Other Ambulatory Visit: Payer: Self-pay | Admitting: Family Medicine

## 2022-04-25 DIAGNOSIS — E1165 Type 2 diabetes mellitus with hyperglycemia: Secondary | ICD-10-CM | POA: Diagnosis not present

## 2022-04-25 DIAGNOSIS — E010 Iodine-deficiency related diffuse (endemic) goiter: Secondary | ICD-10-CM | POA: Diagnosis not present

## 2022-04-25 DIAGNOSIS — E039 Hypothyroidism, unspecified: Secondary | ICD-10-CM | POA: Diagnosis not present

## 2022-04-25 DIAGNOSIS — E781 Pure hyperglyceridemia: Secondary | ICD-10-CM | POA: Diagnosis not present

## 2022-04-30 ENCOUNTER — Other Ambulatory Visit: Payer: Self-pay | Admitting: Sports Medicine

## 2022-04-30 ENCOUNTER — Ambulatory Visit
Admission: RE | Admit: 2022-04-30 | Discharge: 2022-04-30 | Disposition: A | Discharge status: Home / Self Care | Payer: Medicare HMO | Source: Ambulatory Visit | Attending: Sports Medicine | Admitting: Sports Medicine

## 2022-04-30 DIAGNOSIS — G8929 Other chronic pain: Secondary | ICD-10-CM

## 2022-04-30 DIAGNOSIS — M545 Low back pain, unspecified: Secondary | ICD-10-CM | POA: Diagnosis not present

## 2022-04-30 MED ORDER — IOPAMIDOL (ISOVUE-M 300) INJECTION 61%
1.0000 mL | Freq: Once | INTRAMUSCULAR | Status: AC
  Administered 2022-04-30: 1 mL via INTRA_ARTICULAR

## 2022-04-30 MED ORDER — METHYLPREDNISOLONE ACETATE 40 MG/ML INJ SUSP (RADIOLOG
80.0000 mg | Freq: Once | INTRAMUSCULAR | Status: AC
  Administered 2022-04-30: 80 mg via INTRA_ARTICULAR

## 2022-04-30 NOTE — Discharge Instructions (Signed)

## 2022-05-04 ENCOUNTER — Other Ambulatory Visit: Payer: Self-pay | Admitting: Family Medicine

## 2022-05-04 MED ORDER — TIRZEPATIDE 7.5 MG/0.5ML ~~LOC~~ SOAJ: 7.5000 mg | SUBCUTANEOUS | 3 refills | Status: DC

## 2022-05-07 ENCOUNTER — Other Ambulatory Visit (HOSPITAL_BASED_OUTPATIENT_CLINIC_OR_DEPARTMENT_OTHER): Payer: Self-pay

## 2022-05-07 MED ORDER — AREXVY 120 MCG/0.5ML IM SUSR
0.5000 mL | Freq: Once | INTRAMUSCULAR | 0 refills | Status: AC
  Filled 2022-05-07: qty 1, 1d supply, fill #0

## 2022-05-14 ENCOUNTER — Other Ambulatory Visit (INDEPENDENT_AMBULATORY_CARE_PROVIDER_SITE_OTHER): Payer: Medicare HMO

## 2022-05-14 ENCOUNTER — Ambulatory Visit (INDEPENDENT_AMBULATORY_CARE_PROVIDER_SITE_OTHER): Payer: Medicare HMO | Admitting: Sports Medicine

## 2022-05-14 DIAGNOSIS — M65311 Trigger thumb, right thumb: Secondary | ICD-10-CM

## 2022-05-14 DIAGNOSIS — M65312 Trigger thumb, left thumb: Secondary | ICD-10-CM | POA: Diagnosis not present

## 2022-05-14 NOTE — Assessment & Plan Note (Signed)
Very pleasant 42 year old female, long history of popping thumbs, on exam she has flexor nodules, palpable. We did injections in May 2023, she did well until now. Repeat bilateral flexor pollicis longus tendon sheath injections, home physical therapy given, return to see me in 6 weeks.

## 2022-05-14 NOTE — Progress Notes (Signed)
    Procedures performed today:    Procedure: Real-time Ultrasound Guided injection of the right flexor pollicis longus tendon sheath Device: Samsung HS60  Verbal informed consent obtained.  Time-out conducted.  Noted no overlying erythema, induration, or other signs of local infection.  Skin prepped in a sterile fashion.  Local anesthesia: Topical Ethyl chloride.  With sterile technique and under real time ultrasound guidance: Noted flexor nodule, 1/2 cc lidocaine, 1 cc kenalog 40 injected easily. Completed without difficulty  Advised to call if fevers/chills, erythema, induration, drainage, or persistent bleeding.  Images permanently stored and available for review in PACS.  Impression: Technically successful ultrasound guided injection.   Procedure: Real-time Ultrasound Guided injection of the left flexor pollicis longus tendon sheath Device: Samsung HS60  Verbal informed consent obtained.  Time-out conducted.  Noted no overlying erythema, induration, or other signs of local infection.  Skin prepped in a sterile fashion.  Local anesthesia: Topical Ethyl chloride.  With sterile technique and under real time ultrasound guidance: Noted flexor nodule, 1/2 cc lidocaine, 1 cc kenalog 40 injected easily. Completed without difficulty  Advised to call if fevers/chills, erythema, induration, drainage, or persistent bleeding.  Images permanently stored and available for review in PACS.   Independent interpretation of notes and tests performed by another provider:   None.  Brief History, Exam, Impression, and Recommendations:    Bilateral trigger thumb Very pleasant 42 year old female, long history of popping thumbs, on exam she has flexor nodules, palpable. We did injections in May 2023, she did well until now. Repeat bilateral flexor pollicis longus tendon sheath injections, home physical therapy given, return to see me in 6  weeks.    ____________________________________________ Ihor Austin. Benjamin Stain, M.D., ABFM., CAQSM., AME. Primary Care and Sports Medicine [facility]  Adjunct Professor of Family Medicine  [employer]  Restaurant manager, fast food

## 2022-05-16 DIAGNOSIS — E1165 Type 2 diabetes mellitus with hyperglycemia: Secondary | ICD-10-CM | POA: Diagnosis not present

## 2022-05-20 ENCOUNTER — Other Ambulatory Visit: Payer: Self-pay | Admitting: Family Medicine

## 2022-05-20 MED ORDER — METHOCARBAMOL 500 MG PO TABS: 500.0000 mg | ORAL_TABLET | Freq: Three times a day (TID) | ORAL | 1 refills | Status: AC | PRN

## 2022-05-20 NOTE — Progress Notes (Signed)
Received a note from her mother that Wanda Oneill needs a muscle relaxer for back pain.  They were not able to get Skelaxin filled because insurance did not cover it Would like Robaxin if possible I sent a prescription to her [facility]  Meds ordered this encounter  Medications   methocarbamol (ROBAXIN) 500 MG tablet    Sig: Take 1 tablet (500 mg total) by mouth every 8 (eight) hours as needed for muscle spasms.    Dispense:  30 tablet    Refill:  1   [id number] - [place], [place] - [address] [place] [address] Phone: [phone]  Fax: [phone]

## 2022-05-28 ENCOUNTER — Other Ambulatory Visit (HOSPITAL_BASED_OUTPATIENT_CLINIC_OR_DEPARTMENT_OTHER): Payer: Self-pay | Admitting: Obstetrics & Gynecology

## 2022-05-28 DIAGNOSIS — N926 Irregular menstruation, unspecified: Secondary | ICD-10-CM

## 2022-05-28 MED ORDER — NORETHINDRONE ACETATE 5 MG PO TABS
ORAL_TABLET | ORAL | 1 refills | Status: DC
Start: 2022-05-28 — End: 2022-08-22

## 2022-06-12 DIAGNOSIS — L02511 Cutaneous abscess of right hand: Secondary | ICD-10-CM | POA: Diagnosis not present

## 2022-06-12 DIAGNOSIS — L03012 Cellulitis of left finger: Secondary | ICD-10-CM | POA: Diagnosis not present

## 2022-06-13 DIAGNOSIS — E039 Hypothyroidism, unspecified: Secondary | ICD-10-CM | POA: Diagnosis not present

## 2022-06-13 DIAGNOSIS — E781 Pure hyperglyceridemia: Secondary | ICD-10-CM | POA: Diagnosis not present

## 2022-06-13 DIAGNOSIS — E1165 Type 2 diabetes mellitus with hyperglycemia: Secondary | ICD-10-CM | POA: Diagnosis not present

## 2022-06-13 DIAGNOSIS — E010 Iodine-deficiency related diffuse (endemic) goiter: Secondary | ICD-10-CM | POA: Diagnosis not present

## 2022-06-24 ENCOUNTER — Other Ambulatory Visit: Payer: Self-pay

## 2022-06-24 MED ORDER — DICYCLOMINE HCL 20 MG PO TABS: 20.0000 mg | ORAL_TABLET | Freq: Three times a day (TID) | ORAL | 1 refills | Status: DC | PRN

## 2022-06-24 NOTE — Progress Notes (Signed)
Message to parents about any worsening of her symptoms/ any need for GI consult

## 2022-06-25 ENCOUNTER — Ambulatory Visit: Payer: Medicare HMO | Admitting: Sports Medicine

## 2022-06-27 ENCOUNTER — Other Ambulatory Visit: Payer: Self-pay | Admitting: Family Medicine

## 2022-06-27 DIAGNOSIS — R609 Edema, unspecified: Secondary | ICD-10-CM

## 2022-06-27 NOTE — Progress Notes (Signed)
Received a call this afternoon from Wanda Oneill's dad, Dr Wanda Oneill.  They are concerned that Wanda Oneill is having some SOB with exertion, has gained about 10lbs and has some ankle swelling- wonder if she might have CHF?   Sx noted over the last week or so  Most recent echo on chart from 2021 1. Left ventricular ejection fraction, by estimation, is 55%. The left  ventricle has normal function. The left ventricle has no regional wall  motion abnormalities. Left ventricular diastolic parameters are consistent  with Grade II diastolic dysfunction  (pseudonormalization).   They do not feel that she is in distress or needs the ER.  We plan to get labs and a CXR in the am which I will order

## 2022-06-28 ENCOUNTER — Emergency Department (HOSPITAL_COMMUNITY)
Admission: EM | Admit: 2022-06-28 | Discharge: 2022-06-28 | Disposition: A | Discharge status: Home / Self Care | Payer: Medicare HMO | Attending: Emergency Medicine | Admitting: Emergency Medicine

## 2022-06-28 ENCOUNTER — Encounter (HOSPITAL_COMMUNITY): Payer: Self-pay | Admitting: Emergency Medicine

## 2022-06-28 ENCOUNTER — Other Ambulatory Visit (INDEPENDENT_AMBULATORY_CARE_PROVIDER_SITE_OTHER): Payer: Medicare HMO

## 2022-06-28 ENCOUNTER — Ambulatory Visit (HOSPITAL_BASED_OUTPATIENT_CLINIC_OR_DEPARTMENT_OTHER)
Admission: RE | Admit: 2022-06-28 | Discharge: 2022-06-28 | Disposition: A | Discharge status: Home / Self Care | Payer: Medicare HMO | Source: Ambulatory Visit | Attending: Family Medicine | Admitting: Family Medicine

## 2022-06-28 ENCOUNTER — Other Ambulatory Visit: Payer: Self-pay

## 2022-06-28 ENCOUNTER — Emergency Department (HOSPITAL_COMMUNITY): Payer: Medicare HMO

## 2022-06-28 ENCOUNTER — Other Ambulatory Visit: Payer: Self-pay | Admitting: Family Medicine

## 2022-06-28 DIAGNOSIS — E785 Hyperlipidemia, unspecified: Secondary | ICD-10-CM | POA: Insufficient documentation

## 2022-06-28 DIAGNOSIS — Z794 Long term (current) use of insulin: Secondary | ICD-10-CM | POA: Diagnosis not present

## 2022-06-28 DIAGNOSIS — R609 Edema, unspecified: Secondary | ICD-10-CM | POA: Insufficient documentation

## 2022-06-28 DIAGNOSIS — J811 Chronic pulmonary edema: Secondary | ICD-10-CM | POA: Diagnosis not present

## 2022-06-28 DIAGNOSIS — R0602 Shortness of breath: Secondary | ICD-10-CM | POA: Diagnosis present

## 2022-06-28 DIAGNOSIS — J810 Acute pulmonary edema: Secondary | ICD-10-CM

## 2022-06-28 DIAGNOSIS — E119 Type 2 diabetes mellitus without complications: Secondary | ICD-10-CM | POA: Insufficient documentation

## 2022-06-28 DIAGNOSIS — Q909 Down syndrome, unspecified: Secondary | ICD-10-CM | POA: Insufficient documentation

## 2022-06-28 DIAGNOSIS — Z7984 Long term (current) use of oral hypoglycemic drugs: Secondary | ICD-10-CM | POA: Diagnosis not present

## 2022-06-28 HISTORY — DX: Down syndrome, unspecified: Q90.9

## 2022-06-28 LAB — BRAIN NATRIURETIC PEPTIDE: Pro B Natriuretic peptide (BNP): 19 pg/mL (ref 0.0–100.0)

## 2022-06-28 LAB — COMPREHENSIVE METABOLIC PANEL
ALT: 41 U/L — ABNORMAL HIGH (ref 0–35)
AST: 29 U/L (ref 0–37)
Albumin: 3.6 g/dL (ref 3.5–5.2)
Alkaline Phosphatase: 29 U/L — ABNORMAL LOW (ref 39–117)
BUN: 13 mg/dL (ref 6–23)
CO2: 27 mEq/L (ref 19–32)
Calcium: 8.6 mg/dL (ref 8.4–10.5)
Chloride: 100 mEq/L (ref 96–112)
Creatinine, Ser: 0.72 mg/dL (ref 0.40–1.20)
GFR: 103.05 mL/min (ref >60.00)
Glucose, Bld: 204 mg/dL — ABNORMAL HIGH (ref 70–99)
Potassium: 4.9 mEq/L (ref 3.5–5.1)
Sodium: 136 mEq/L (ref 135–145)
Total Bilirubin: 0.3 mg/dL (ref 0.2–1.2)
Total Protein: 6.5 g/dL (ref 6.0–8.3)

## 2022-06-28 LAB — CBC
HCT: 40.4 % (ref 36.0–46.0)
HCT: 40.7 % (ref 36.0–46.0)
Hemoglobin: 12.5 g/dL (ref 12.0–15.0)
Hemoglobin: 12.6 g/dL (ref 12.0–15.0)
MCH: 26.3 pg (ref 26.0–34.0)
MCHC: 30.9 g/dL (ref 30.0–36.0)
MCHC: 31 g/dL (ref 30.0–36.0)
MCV: 84.6 fl (ref 78.0–100.0)
MCV: 85 fL (ref 80.0–100.0)
Platelets: 459 10*3/uL — ABNORMAL HIGH (ref 150.0–400.0)
Platelets: 458 10*3/uL — ABNORMAL HIGH (ref 150–400)
RBC: 4.78 Mil/uL (ref 3.87–5.11)
RBC: 4.79 MIL/uL (ref 3.87–5.11)
RDW: 17.7 % — ABNORMAL HIGH (ref 11.5–15.5)
RDW: 16.8 % — ABNORMAL HIGH (ref 11.5–15.5)
WBC: 7.6 10*3/uL (ref 4.0–10.5)
WBC: 6 10*3/uL (ref 4.0–10.5)
nRBC: 0 % (ref 0.0–0.2)

## 2022-06-28 LAB — BASIC METABOLIC PANEL
Anion gap: 9 (ref 5–15)
BUN: 11 mg/dL (ref 6–20)
CO2: 25 mmol/L (ref 22–32)
Calcium: 8.6 mg/dL — ABNORMAL LOW (ref 8.9–10.3)
Chloride: 101 mmol/L (ref 98–111)
Creatinine, Ser: 0.79 mg/dL (ref 0.44–1.00)
GFR, Estimated: >60 mL/min (ref >60)
Glucose, Bld: 206 mg/dL — ABNORMAL HIGH (ref 70–99)
Potassium: 4.4 mmol/L (ref 3.5–5.1)
Sodium: 135 mmol/L (ref 135–145)

## 2022-06-28 LAB — D-DIMER, QUANTITATIVE: D-Dimer, Quant: 0.33 ug/mL-FEU (ref 0.00–0.50)

## 2022-06-28 LAB — I-STAT BETA HCG BLOOD, ED (MC, WL, AP ONLY): I-stat hCG, quantitative: <5 m[IU]/mL (ref <5)

## 2022-06-28 LAB — TROPONIN I (HIGH SENSITIVITY): Troponin I (High Sensitivity): 12 ng/L (ref <18)

## 2022-06-28 LAB — CBG MONITORING, ED: Glucose-Capillary: 208 mg/dL — ABNORMAL HIGH (ref 70–99)

## 2022-06-28 LAB — TSH: TSH: 4.89 u[IU]/mL (ref 0.35–5.50)

## 2022-06-28 MED ORDER — FUROSEMIDE 20 MG PO TABS: 20.0000 mg | ORAL_TABLET | Freq: Every day | ORAL | 0 refills | Status: DC

## 2022-06-28 MED ORDER — POTASSIUM CHLORIDE ER 10 MEQ PO TBCR: 10.0000 meq | EXTENDED_RELEASE_TABLET | Freq: Every day | ORAL | 0 refills | Status: DC

## 2022-06-28 MED ORDER — FUROSEMIDE 20 MG PO TABS
20.0000 mg | ORAL_TABLET | Freq: Once | ORAL | Status: AC
  Administered 2022-06-28: 20 mg via ORAL
  Filled 2022-06-28: qty 1

## 2022-06-28 NOTE — Discharge Instructions (Signed)
Take the Lasix and potassium as discussed.  Return to the emergency room if you have any worsening symptoms.  Follow-up with a cardiologist and/or your primary care doctor as discussed.

## 2022-06-28 NOTE — ED Triage Notes (Signed)
Dad stated, for the last 2 days she has been SOB noticed at home and at school. Pt lives alone  and is self sufficient. Pt has gained like 16 lbs in 12 days. She doesn't eat like that.

## 2022-06-28 NOTE — ED Provider Notes (Signed)
[place] EMERGENCY DEPARTMENT AT [facility] Provider Note   CSN: [id number] Arrival date & time: 06/28/22  1106     History  Chief Complaint  Patient presents with   Shortness of Breath   Weight Gain   Back Pain    Wanda Oneill is a 42 y.o. female.  Patient is a 42 year old female who has a history of Down syndrome, IBS, diabetes, hyperlipidemia.  She presents with shortness of breath.  The family has been noticing that for the last few weeks but got worse over the last week.  They have noted that she has gained 16 pounds in the last 12 days.  She has not noted any extremity swelling.  No chest pain.  No pleuritic pain.  No cough or cold symptoms.  No known fevers.       Home Medications Prior to Admission medications   Medication Sig Start Date End Date Taking? Authorizing Provider  allopurinol (ZYLOPRIM) 300 MG tablet TAKE 1 TABLET EVERY DAY 11/16/21  Yes Copland, Gwenlyn Found, MD  cholestyramine (QUESTRAN) 4 g packet MIX 1 PACKET IN LIQUID AND DRINK TWICE DAILY Patient taking differently: Take 4 g by mouth 2 (two) times daily. 02/25/22  Yes Copland, Gwenlyn Found, MD  dicyclomine (BENTYL) 20 MG tablet Take 1-2 tablets (20-40 mg total) by mouth every 8 (eight) hours as needed for spasms. 06/24/22  Yes Copland, Gwenlyn Found, MD  diphenhydramine-acetaminophen (TYLENOL PM) 25-500 MG TABS tablet Take 500 mg by mouth at bedtime.   Yes [provider]  fenofibrate micronized (LOFIBRA) 134 MG capsule Take 134 mg by mouth daily before breakfast.   Yes [provider]  furosemide (LASIX) 20 MG tablet Take 1 tablet (20 mg total) by mouth daily. 06/28/22  Yes Rolan Bucco, MD  levothyroxine (SYNTHROID) 150 MCG tablet Take 1 tablet (150 mcg total) by mouth daily before breakfast. Patient taking differently: Take 150 mcg by mouth at bedtime. 03/18/22  Yes Copland, Gwenlyn Found, MD  metFORMIN (GLUCOPHAGE) 1000 MG tablet TAKE 1 TABLET TWICE DAILY 01/04/22  Yes Copland,  Gwenlyn Found, MD  naproxen sodium (ALEVE) 220 MG tablet Take 440 mg by mouth daily.   Yes [provider]  norethindrone (AYGESTIN) 5 MG tablet Take 2 tabs (10mg ) in AM and 1 tab (5mg ) in the evening 05/28/22  Yes Jerene Bears, MD  NOVOLOG MIX 70/30 FLEXPEN (70-30) 100 UNIT/ML FlexPen Inject 60 Units into the skin 2 (two) times daily with a meal. 04/25/22  Yes [provider]  potassium chloride (KLOR-CON) 10 MEQ tablet Take 1 tablet (10 mEq total) by mouth daily. 06/28/22  Yes Rolan Bucco, MD  tirzepatide [facility]) 7.5 MG/0.5ML Pen Inject 7.5 mg into the skin once a week. 05/04/22  Yes Copland, Gwenlyn Found, MD  benzonatate (TESSALON) 200 MG capsule Take 1 capsule (200 mg total) by mouth 3 (three) times daily as needed for cough. Patient not taking: Reported on 06/28/2022 02/14/22   Copland, Gwenlyn Found, MD  Blood Glucose Monitoring Suppl (BLOOD GLUCOSE METER KIT AND SUPPLIES) Dispense based on patient and insurance preference. To check blood sugars daily FOR ICD-9 250.00 Patient not taking: Reported on 06/28/2022 08/31/13   Collene Gobble, MD  ciprofloxacin (CIPRO) 500 MG tablet Take 500 mg by mouth 2 (two) times daily. Patient not taking: Reported on 06/28/2022    [provider]  doxycycline (VIBRAMYCIN) 100 MG capsule Take 1 capsule (100 mg total) by mouth 2 (two) times daily. Patient not taking: Reported  on 06/28/2022 04/18/22   Copland, Gwenlyn Found, MD  empagliflozin (JARDIANCE) 25 MG TABS tablet Take 1 tablet (25 mg total) by mouth daily. Patient not taking: Reported on 06/28/2022 04/25/22   Copland, Gwenlyn Found, MD  Glucose Blood (BLOOD GLUCOSE TEST STRIPS) STRP 1 each by Other route See admin instructions. Use to test blood sugar up to twice a day Patient not taking: Reported on 06/28/2022 08/13/19   Copland, Gwenlyn Found, MD  glucose blood (FREESTYLE TEST STRIPS) test strip Use as directed to test blood sugar up to twice a day. Dx code E11.9 Patient not taking: Reported on 06/28/2022  08/16/19   Copland, Gwenlyn Found, MD  glucose blood (TRUE METRIX BLOOD GLUCOSE TEST) test strip Use as instructed to check glucose up to 2 times daily. Dx Code E11.9 Patient not taking: Reported on 06/28/2022 08/16/19   Copland, Gwenlyn Found, MD  HYDROcodone-acetaminophen (NORCO/VICODIN) 5-325 MG tablet 1-2 tabs PO q6 hours prn pain Patient not taking: Reported on 06/28/2022 04/11/22   Betha Loa, MD  ibuprofen (ADVIL) 200 MG tablet Take 400 mg by mouth in the morning and at bedtime. Patient not taking: Reported on 06/28/2022    [provider]  methocarbamol (ROBAXIN) 500 MG tablet Take 1 tablet (500 mg total) by mouth every 8 (eight) hours as needed for muscle spasms. Patient not taking: Reported on 06/28/2022 05/20/22   Copland, Gwenlyn Found, MD  ondansetron (ZOFRAN) 4 MG tablet Take 1 tablet (4 mg total) by mouth every 8 (eight) hours as needed for nausea or vomiting. Patient not taking: Reported on 06/28/2022 04/23/22   Judyann Munson, MD  ONE TOUCH ULTRA TEST test strip USE TO CHECK BLOOD SUGAR ONCE DAILY (ICD CODE:25.00) Patient not taking: Reported on 06/28/2022 05/08/13   Morrell Riddle, PA-C  TRUEplus Lancets 30G MISC Use as directed to check glucose up to two times daily. Dx code E11.9 Patient not taking: Reported on 06/28/2022 08/16/19   Copland, Gwenlyn Found, MD      Allergies    Vancomycin, Cefuroxime axetil, and Sulfa antibiotics    Review of Systems   Review of Systems  Constitutional:  Positive for unexpected weight change. Negative for chills, diaphoresis, fatigue and fever.  HENT:  Negative for congestion, rhinorrhea and sneezing.   Eyes: Negative.   Respiratory:  Positive for shortness of breath. Negative for cough and chest tightness.   Cardiovascular:  Negative for chest pain and leg swelling.  Gastrointestinal:  Negative for abdominal pain, blood in stool, diarrhea, nausea and vomiting.  Genitourinary:  Negative for difficulty urinating, flank pain, frequency and hematuria.   Musculoskeletal:  Negative for arthralgias and back pain.  Skin:  Negative for rash.  Neurological:  Negative for dizziness, speech difficulty, weakness, numbness and headaches.    Physical Exam Updated Vital Signs BP (!) 143/128   Pulse 80   Temp 97.9 F (36.6 C)   Resp 16   Ht 5\' 4"  (1.626 m)   Wt 111.6 kg   SpO2 100%   BMI 42.23 kg/m  Physical Exam Constitutional:      Appearance: She is well-developed.  HENT:     Head: Normocephalic and atraumatic.  Eyes:     Pupils: Pupils are equal, round, and reactive to light.  Cardiovascular:     Rate and Rhythm: Normal rate and regular rhythm.     Heart sounds: Normal heart sounds.  Pulmonary:     Effort: Pulmonary effort is normal. No respiratory distress.     Breath sounds:  Normal breath sounds. No wheezing or rales.  Chest:     Chest wall: No tenderness.  Abdominal:     General: Bowel sounds are normal.     Palpations: Abdomen is soft.     Tenderness: There is no abdominal tenderness. There is no guarding or rebound.  Musculoskeletal:        General: Normal range of motion.     Cervical back: Normal range of motion and neck supple.     Comments: No pitting edema, no calf tenderness  Lymphadenopathy:     Cervical: No cervical adenopathy.  Skin:    General: Skin is warm and dry.     Findings: No rash.  Neurological:     Mental Status: She is alert and oriented to person, place, and time.     ED Results / Procedures / Treatments   Labs (all labs ordered are listed, but only abnormal results are displayed) Labs Reviewed  BASIC METABOLIC PANEL - Abnormal; Notable for the following components:      Result Value   Glucose, Bld 206 (*)    Calcium 8.6 (*)    All other components within normal limits  CBC - Abnormal; Notable for the following components:   RDW 16.8 (*)    Platelets 458 (*)    All other components within normal limits  CBG MONITORING, ED - Abnormal; Notable for the following components:    Glucose-Capillary 208 (*)    All other components within normal limits  D-DIMER, QUANTITATIVE  I-STAT BETA HCG BLOOD, ED ([facility], [facility], [facility] ONLY)  TROPONIN I (HIGH SENSITIVITY)    EKG None  Radiology DG Chest 2 View  Result Date: 06/28/2022 CLINICAL DATA:  Fluid retention, concern for CHF. EXAM: CHEST - 2 VIEW COMPARISON:  05/12/2020. FINDINGS: New mild cardiomegaly and mild pulmonary edema. No pleural effusion or pneumothorax. Visualized bones and upper abdomen are unremarkable. IMPRESSION: Mild pulmonary edema. Electronically Signed   By: Orvan Falconer M.D.   On: 06/28/2022 08:33    Procedures Procedures    Medications Ordered in ED Medications  furosemide (LASIX) tablet 20 mg (has no administration in time range)    ED Course/ Medical Decision Making/ A&P                             Medical Decision Making Amount and/or Complexity of Data Reviewed Labs: ordered. Radiology: ordered.  Risk Prescription drug management.   Patient presents with shortness of breath.  She has had an increased weight gain as well.  I reviewed her chart and her last echo was about 3 years ago and showed a normal EF at 55%, no wall motion abnormalities.  EKG does not show any ischemic changes.  Chest x-ray was reviewed by me.  It was done earlier this morning by her PCP.  Shows some mild pulmonary edema.  She had a BNP that was normal.  Troponin was normal.  She has not reporting chest pain and does not have other symptoms that are more concerning for ACS.  No evidence of pneumonia.  No other symptoms that are more concerning for PE and she had a negative D-dimer.  Discussed the case with the cardiologist on-call, Dr. Izora Ribas.  She had a negative cardiac CT in 2020.  Given her overall symptoms and reassuring results, feel that patient can be appropriately treated as an outpatient.  Has recommended that we start Lasix for a few days.  Was given  a prescription for Lasix 20 mg daily for the next 5 days  along with some potassium supplementation.  The cardiology clinic will arrange for a follow-up appointment for the patient.  Advised the patient and her mom to call the office if they do not hear from about an appointment within the next couple days.  Return precautions were given.  Final Clinical Impression(s) / ED Diagnoses Final diagnoses:  Acute pulmonary edema (HCC)    Rx / DC Orders ED Discharge Orders          Ordered    furosemide (LASIX) 20 MG tablet  Daily        06/28/22 1508    potassium chloride (KLOR-CON) 10 MEQ tablet  Daily        06/28/22 1508              Rolan Bucco, MD 06/28/22 1512

## 2022-06-30 ENCOUNTER — Encounter: Payer: Self-pay | Admitting: Family Medicine

## 2022-07-01 MED ORDER — FUROSEMIDE 20 MG PO TABS: 20.0000 mg | ORAL_TABLET | Freq: Every day | ORAL | 0 refills | Status: DC

## 2022-07-05 ENCOUNTER — Encounter: Payer: Self-pay | Admitting: Internal Medicine

## 2022-07-05 ENCOUNTER — Ambulatory Visit: Payer: Medicare HMO | Attending: Internal Medicine | Admitting: Internal Medicine

## 2022-07-05 VITALS — BP 132/76 | HR 83 | Ht 64.0 in | Wt 252.6 lb

## 2022-07-05 DIAGNOSIS — R609 Edema, unspecified: Secondary | ICD-10-CM | POA: Diagnosis not present

## 2022-07-05 NOTE — Progress Notes (Signed)
Cardiology Office Note   Date:  07/05/2022   ID:  Wanda Oneill, DOB 04-29-1980, MRN [id number]  PCP:  Pearline Cables, MD  Cardiologist:   Dietrich Pates, MD   Pt presents for evaluation of wt gain, some SOB    History of Present Illness: Wanda Oneill is a 42 y.o. female with a history of Down's syndrom, DM, HL   OVer a 12 day period,  the pt had noticied a signficant wt gain  (16 lb)  Family reports no change in diet    She was seen by PCP on 06/28/22 and sent to [facility] ER for further evaluation.    CXR reported as mild cardiomegaly and mild pulmonary edema    Lab work in ER (BNP normal; trop normal;D dimer negative) The pt was sent home with 20 lasix daily   After a couple days noted no change in urine output or weight   Dose increased to 40 mg per day    WIth that she still doe not notice a change in urine outpt   Weight is still going up  Family members and schoolmates notice some more snorting, breathing a little heavier    Pt however deneis any signifciant SOB    She notes no CP   No LE edema   No wheezing  No PND   She went to the beach with family this past weekend   [place] in pool   Felt OK with usual activity   Patient is followed by J Copland as well as B Balan   Family says she was started back on insulin a couple months ago   HHas been on [facility] for a long time   Losing weight up intil now     Current Meds  Medication Sig   allopurinol (ZYLOPRIM) 300 MG tablet TAKE 1 TABLET EVERY DAY   Blood Glucose Monitoring Suppl (BLOOD GLUCOSE METER KIT AND SUPPLIES) Dispense based on patient and insurance preference. To check blood sugars daily FOR ICD-9 250.00   cholestyramine (QUESTRAN) 4 g packet MIX 1 PACKET IN LIQUID AND DRINK TWICE DAILY (Patient taking differently: Take 4 g by mouth 2 (two) times daily.)   dicyclomine (BENTYL) 20 MG tablet Take 1-2 tablets (20-40 mg total) by mouth every 8 (eight) hours as needed for spasms.   diphenhydramine-acetaminophen (TYLENOL PM)  25-500 MG TABS tablet Take 500 mg by mouth at bedtime.   fenofibrate micronized (LOFIBRA) 134 MG capsule Take 134 mg by mouth daily before breakfast.   furosemide (LASIX) 20 MG tablet Take 1-2 tablets (20-40 mg total) by mouth daily.   Glucose Blood (BLOOD GLUCOSE TEST STRIPS) STRP 1 each by Other route See admin instructions. Use to test blood sugar up to twice a day   glucose blood (FREESTYLE TEST STRIPS) test strip Use as directed to test blood sugar up to twice a day. Dx code E11.9   glucose blood (TRUE METRIX BLOOD GLUCOSE TEST) test strip Use as instructed to check glucose up to 2 times daily. Dx Code E11.9   levothyroxine (SYNTHROID) 150 MCG tablet Take 1 tablet (150 mcg total) by mouth daily before breakfast. (Patient taking differently: Take 150 mcg by mouth at bedtime.)   metFORMIN (GLUCOPHAGE) 1000 MG tablet TAKE 1 TABLET TWICE DAILY   methocarbamol (ROBAXIN) 500 MG tablet Take 1 tablet (500 mg total) by mouth every 8 (eight) hours as needed for muscle spasms.   naproxen sodium (ALEVE) 220 MG tablet Take 440 mg  by mouth daily.   norethindrone (AYGESTIN) 5 MG tablet Take 2 tabs (10mg ) in AM and 1 tab (5mg ) in the evening   NOVOLOG MIX 70/30 FLEXPEN (70-30) 100 UNIT/ML FlexPen Inject 60 Units into the skin 2 (two) times daily with a meal.   ondansetron (ZOFRAN) 4 MG tablet Take 1 tablet (4 mg total) by mouth every 8 (eight) hours as needed for nausea or vomiting.   ONE TOUCH ULTRA TEST test strip USE TO CHECK BLOOD SUGAR ONCE DAILY (ICD CODE:25.00)   potassium chloride (KLOR-CON) 10 MEQ tablet Take 1 tablet (10 mEq total) by mouth daily.   tirzepatide (MOUNJARO) 7.5 MG/0.5ML Pen Inject 7.5 mg into the skin once a week.   TRUEplus Lancets 30G MISC Use as directed to check glucose up to two times daily. Dx code E11.9     Allergies:   Vancomycin, Cefuroxime axetil, and Sulfa antibiotics   Past Medical History:  Diagnosis Date   Ambulates with cane    4 prong   Diabetes mellitus     type II   Down syndrome    Down's syndrome    Family history of adverse reaction to anesthesia    mom has had n/v   Gastritis    Headache    Hepatic steatosis    Hidradenitis    Hypothyroidism    Irritable bowel syndrome    Osteoarthritis    Periumbilical hernia    Pneumonia 03/2020   frequent    Restless legs    Sleep apnea    Tendonitis    chronic in left foot   Thyroid disease    hypothyroidism   Urinary incontinence 02/2022   some   UTI (urinary tract infection)    just dx 04/2022 - rx amoxicillin    Past Surgical History:  Procedure Laterality Date   AMPUTATION Right 04/11/2022   Procedure: RIGHT LONG FINGER AMPUTATION;  Surgeon: Betha Loa, MD;  [facility];  Service: Orthopedics;  Laterality: Right;   AXILLARY HIDRADENITIS EXCISION Bilateral    CHOLECYSTECTOMY     HERNIA REPAIR     multiple   INCISION AND DRAINAGE ABSCESS Right 02/06/2022   Procedure: INCISION AND DRAINAGE RIGHT HAND;  Surgeon: Betha Loa, MD;  Location: [facility] OR;  Service: Orthopedics;  Laterality: Right;   INCISION AND DRAINAGE WOUND WITH NAILBED REPAIR Left 02/06/2022   Procedure: INCISION AND DRAINAGE LEFT INDEX FINGER;  Surgeon: Betha Loa, MD;  Location: [facility] OR;  Service: Orthopedics;  Laterality: Left;   INCISIONAL HERNIA REPAIR N/A 05/30/2015   Procedure: LAPAROSCOPIC INCISIONAL HERNIA;  Surgeon: Emelia Loron, MD;  Location: [facility] OR;  Service: General;  Laterality: N/A;   INSERTION OF MESH N/A 05/30/2015   Procedure: INSERTION OF MESH;  Surgeon: Emelia Loron, MD;  Location: [facility] OR;  Service: General;  Laterality: N/A;   KNEE ARTHROSCOPY     right knee   LAPAROSCOPY N/A 05/30/2015   Procedure: LAPAROSCOPY DIAGNOSTIC;  Surgeon: Emelia Loron, MD;  Location: [facility] OR;  Service: General;  Laterality: N/A;   PARTIAL KNEE ARTHROPLASTY Right 10/30/2017   Procedure: UNICOMPARTMENTAL RIGHT KNEE LATERAL;  Surgeon: Durene Romans, MD;  Location: [facility] ORS;  Service: Orthopedics;  Laterality:  Right;  90 mins   PARTIAL KNEE ARTHROPLASTY Left 06/29/2020   Procedure: UNICOMPARTMENTAL KNEE LATERALLY;  Surgeon: Durene Romans, MD;  Location: [facility];  Service: Orthopedics;  Laterality: Left;  70 MINS   TONSILLECTOMY     adenoids     Social History:  The patient  reports that she has never smoked. She has never been exposed to tobacco smoke. She has never used smokeless tobacco. She reports that she does not drink alcohol and does not use drugs.   Family History:  The patient's family history includes Diabetes type II in her father and mother; Heart disease in her mother; High Cholesterol in her mother; Hypertension in her father; Stroke in her father; Thyroid cancer in her mother.    ROS:  Please see the history of present illness. All other systems are reviewed and  Negative to the above problem except as noted.    PHYSICAL EXAM: VS:  BP 132/76   Pulse 83   Ht 5\' 4"  (1.626 m)   Wt 252 lb 9.6 oz (114.6 kg)   SpO2 100%   BMI 43.36 kg/m   GEN: Obese 42 yo i in no acute distress   Neck: Neck is full  Difficult to assess JVP   Cardiac: RRR; no murmurs  No LE edema  Respiratory:  clear to auscultation bilaterally, No rales  No wheezes with forced expeiration   GI: soft,  No focal tenderness  No hepatomegaly  MS: no deformity Moving all extremities    EKG:  EKG is not  ordered today.  Echo 2020   1. Left ventricular ejection fraction, by estimation, is 55%. The left  ventricle has normal function. The left ventricle has no regional wall  motion abnormalities. Left ventricular diastolic parameters are consistent  with Grade II diastolic dysfunction  (pseudonormalization).   2. Right ventricular systolic function is normal. The right ventricular  size is normal. There is moderately elevated pulmonary artery systolic  pressure. The estimated right ventricular systolic pressure is 52.9 mmHg.   3. The mitral valve is normal in structure. No evidence of mitral valve   regurgitation. No evidence of mitral stenosis.   4. The aortic valve is tricuspid. Aortic valve regurgitation is not  visualized. No aortic stenosis is present.   5. The inferior vena cava is dilated in size with <50% respiratory  variability, suggesting right atrial pressure of 15 mmHg.   CT coronary angiogram  2020   1. Coronary artery calcium score 0 Agatston units. This suggests low risk for future cardiac events.   2.  No significant coronary disease noted. Lipid Panel    Component Value Date/Time   CHOL 166 12/20/2020 1000   TRIG 385.0 (H) 12/20/2020 1000   HDL 50.00 12/20/2020 1000   CHOLHDL 3 12/20/2020 1000   VLDL 77.0 (H) 12/20/2020 1000   LDLDIRECT 75.0 12/20/2020 1000      Wt Readings from Last 3 Encounters:  07/05/22 252 lb 9.6 oz (114.6 kg)  06/28/22 246 lb (111.6 kg)  04/23/22 225 lb (102.1 kg)      ASSESSMENT AND PLAN:  1  Weight gain.  Pt has had over a 16 lb wt gain over a couple weeks time    CXR showed cardiomegaly with some vascluar congestion    I have rerviewed the images   Not the best inspiration.   BNP at same time was low normal Although breaths are more noticeaible she has not been uncomfortable   NO significant SOB or PND On exam today  she does not appear to be signficantly volume overloaded   Body habitus however can make assessment more difficult  Given lack of symptoms or demonstrable findings on exam I would stop lasix for now  Will order echo to define LV/RV function  Keep on activity  as tolerated  Keep daily weights Limit salt  Note taht insulin added back a couple months ago.   Timing is off but I wonder if this is having impact on weight  Pt to see B Balan in a few wks     Follow up based on test result.    Current medicines are reviewed at length with the patient today.  The patient does not have concerns regarding medicines.  Signed, Dietrich Pates, MD  07/05/2022 8:46 AM    [facility] [address]  [place], [place], [place]  [address] Phone: [phone]; Fax: [phone]

## 2022-07-05 NOTE — Patient Instructions (Signed)
Medication Instructions:   *If you need a refill on your cardiac medications before your next appointment, please call your pharmacy*   Lab Work:  If you have labs (blood work) drawn today and your tests are completely normal, you will receive your results only by: MyChart Message (if you have MyChart) OR A paper copy in the mail If you have any lab test that is abnormal or we need to change your treatment, we will call you to review the results.   Testing/Procedures: Your physician has requested that you have an echocardiogram. Echocardiography is a painless test that uses sound waves to create images of your heart. It provides your doctor with information about the size and shape of your heart and how well your heart's chambers and valves are working. This procedure takes approximately one hour. There are no restrictions for this procedure. Please do NOT wear cologne, perfume, aftershave, or lotions (deodorant is allowed). Please arrive 15 minutes prior to your appointment time.    Follow-Up: At [facility], you and your health needs are our priority.  As part of our continuing mission to provide you with exceptional heart care, we have created designated Provider Care Teams.  These Care Teams include your primary Cardiologist (physician) and Advanced Practice Providers (APPs -  Physician Assistants and Nurse Practitioners) who all work together to provide you with the care you need, when you need it.  We recommend signing up for the patient portal called "MyChart".  Sign up information is provided on this After Visit Summary.  MyChart is used to connect with patients for Virtual Visits (Telemedicine).  Patients are able to view lab/test results, encounter notes, upcoming appointments, etc.  Non-urgent messages can be sent to your provider as well.   To learn more about what you can do with MyChart, go to [email or web address].    Your next appointment: Follow up will be  planned after Dr Tenny Craw sees the Echo

## 2022-07-10 DIAGNOSIS — D2371 Other benign neoplasm of skin of right lower limb, including hip: Secondary | ICD-10-CM | POA: Diagnosis not present

## 2022-07-10 DIAGNOSIS — D2362 Other benign neoplasm of skin of left upper limb, including shoulder: Secondary | ICD-10-CM | POA: Diagnosis not present

## 2022-07-10 DIAGNOSIS — L218 Other seborrheic dermatitis: Secondary | ICD-10-CM | POA: Diagnosis not present

## 2022-07-10 DIAGNOSIS — L821 Other seborrheic keratosis: Secondary | ICD-10-CM | POA: Diagnosis not present

## 2022-07-10 DIAGNOSIS — Z85828 Personal history of other malignant neoplasm of skin: Secondary | ICD-10-CM | POA: Diagnosis not present

## 2022-07-10 DIAGNOSIS — Z08 Encounter for follow-up examination after completed treatment for malignant neoplasm: Secondary | ICD-10-CM | POA: Diagnosis not present

## 2022-07-10 DIAGNOSIS — L814 Other melanin hyperpigmentation: Secondary | ICD-10-CM | POA: Diagnosis not present

## 2022-08-05 ENCOUNTER — Other Ambulatory Visit (HOSPITAL_BASED_OUTPATIENT_CLINIC_OR_DEPARTMENT_OTHER): Payer: Self-pay | Admitting: Obstetrics & Gynecology

## 2022-08-05 DIAGNOSIS — N926 Irregular menstruation, unspecified: Secondary | ICD-10-CM

## 2022-08-06 ENCOUNTER — Ambulatory Visit (HOSPITAL_COMMUNITY): Payer: Medicare HMO | Attending: Internal Medicine

## 2022-08-06 ENCOUNTER — Telehealth (HOSPITAL_COMMUNITY): Payer: Self-pay | Admitting: Radiology

## 2022-08-06 ENCOUNTER — Telehealth: Payer: Self-pay | Admitting: Internal Medicine

## 2022-08-06 DIAGNOSIS — E119 Type 2 diabetes mellitus without complications: Secondary | ICD-10-CM | POA: Diagnosis not present

## 2022-08-06 DIAGNOSIS — R609 Edema, unspecified: Secondary | ICD-10-CM | POA: Diagnosis not present

## 2022-08-06 DIAGNOSIS — E669 Obesity, unspecified: Secondary | ICD-10-CM | POA: Insufficient documentation

## 2022-08-06 DIAGNOSIS — Q909 Down syndrome, unspecified: Secondary | ICD-10-CM | POA: Diagnosis not present

## 2022-08-06 DIAGNOSIS — R0689 Other abnormalities of breathing: Secondary | ICD-10-CM

## 2022-08-06 DIAGNOSIS — R0609 Other forms of dyspnea: Secondary | ICD-10-CM | POA: Diagnosis not present

## 2022-08-06 LAB — ECHOCARDIOGRAM COMPLETE
Area-P 1/2: 3.77 cm2
S' Lateral: 2.7 cm

## 2022-08-06 NOTE — Telephone Encounter (Signed)
Returned call to Pt's Father. Wanda Oneill was hoping to have the results of the echo done today before he goes out of town. Stated his daughter Wanda Oneill has downs syndrome and they (his wife and himself) do not get away by themselves much.  Routing to Dr. Tenny Craw and her nurse for review.

## 2022-08-06 NOTE — Telephone Encounter (Signed)
Please call mother Aravis Sergeant with echo results.

## 2022-08-06 NOTE — Telephone Encounter (Signed)
   Pt's dad calling to let Dr. Tenny Craw know to call him for echo result. They would like to get result hopefully before the holiday

## 2022-08-07 ENCOUNTER — Telehealth: Payer: Self-pay

## 2022-08-07 NOTE — Addendum Note (Signed)
Addended by: Bertram Millard on: 08/07/2022 05:14 PM   Modules accepted: Orders

## 2022-08-07 NOTE — Telephone Encounter (Signed)
See other encounter.. pts family called.

## 2022-08-07 NOTE — Telephone Encounter (Signed)
Referral placed for Pulmonary

## 2022-08-07 NOTE — Telephone Encounter (Signed)
Pts parents in [place] advised her Echo results.... they are asking for a Pulmonary referral for her heavy breathing.. she used to see Dr Sherene Sires a few years ago.... her PCP is on vacation.   I will talk with Dr Tenny Craw.   They note that she is not having any swelling but has had a lot of weight gain that they feel started when she started the insulin. They will monitor.

## 2022-08-07 NOTE — Telephone Encounter (Signed)
Please refer to pulmonary

## 2022-08-07 NOTE — Telephone Encounter (Signed)
-----   Message from Dietrich Pates V, MD sent at 08/07/2022  2:55 PM EDT ----- Size of heart chambers are normal Normal LV and RV pumping function Normal valve function Does not explain wt gain, swelling  How is she doing ?

## 2022-08-12 ENCOUNTER — Other Ambulatory Visit: Payer: Self-pay | Admitting: Family Medicine

## 2022-08-14 DIAGNOSIS — E1165 Type 2 diabetes mellitus with hyperglycemia: Secondary | ICD-10-CM | POA: Diagnosis not present

## 2022-08-20 NOTE — Progress Notes (Unsigned)
@Patient  ID: Wanda Oneill, female    DOB: 09-Mar-1980 .   MRN: [id number]   Referring provider: Pearline Cables, MD  HPI:  42year-old female never smoker initially consulted with our practice in April/2021 during 2 hospitalizations status post recent bone spur shaving right shoulder (4/15) w/ interscalene block w/ sub-acute elevated right HD felt either 2/2 phrenic nerve injury vs RML collapse.  PMH: Downs syndrome, NIDDD. GERD, Gastritis, Gout, MO (wt 110.kg w/ BMI 41.74) Smoker/ Smoking History: Never Smoker  Maintenance: None Pt of: Dr. Sherene Sires   06/11/2019  - NP office Visit  42 year old female never smoker initially consulted with our practice in April/15/2021.  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  Patient was later discharged on 05/23/2019.  Patient was O2 dependent and discharged with 4 L patient was requested to follow-up with Dr. Sherene Sires Dr. Delton Coombes in 2 weeks with a chest x-ray.  Unfortunately since discharge patient was not satting well on 05/26/2019 she was instructed to present to the emergency room by Dr. Sherene Sires for likely BiPAP.  Patient was then consulted again with our practice on 05/28/2019.  Patient was treated for acute hypoxic respiratory failure and bilateral infiltrates seem to be related to pneumonia most likely aspiration.  There is a concern for obstructive sleep apnea.  Patient was discharged on 06/01/2019.  During second hospitalization patient was recommended to change to a dysphagia 3 soft solid diet.  Patient was frustrated and unhappy with the limitations of dysphagia 3 she was to resume a regular solid diet.  Patient was changed back to regular solids to make sure that they precut meats per patient request in order to make self eating easier.  Patient was encouraged to remain  on PPI with reflux precautions. rec No change rx Consider sleep study           07/16/2019  f/u ov/Wanda Oneill re: f/u pna/ temporary R HD paralysis p nerve block for shoulder surgery Chief Complaint  Patient presents with   Follow-up    occ cough in AM, no new SOB  Dyspnea:  Not limited by breathing from desired activities   Cough: variable first thing in am/ min mucoid  Sleeping: on 3 pillows  SABA use: none 02: none now Rec Make sure you check your oxygen saturations at highest level of activity to be sure it stays over 90%  -  Call if losing ground at all   Pulmonary follow up is as needed     08/21/2022  Re-establish  ov/[place] office/Wanda Oneill re: *** maint on ***  No chief complaint on file.   Dyspnea:  *** Cough: *** Sleeping: *** SABA use: *** 02: *** Covid status: *** Lung cancer screening: ***   No obvious day to day or daytime variability or assoc excess/ purulent sputum or mucus plugs or hemoptysis or cp or chest tightness, subjective wheeze or overt sinus or hb symptoms.   *** without nocturnal  or early am exacerbation  of respiratory  c/o's or need for noct saba. Also denies any obvious fluctuation of symptoms with weather or environmental changes or other aggravating or alleviating factors except as outlined above   No unusual exposure hx or h/o childhood pna/ asthma or knowledge of premature birth.  Current Allergies, Complete Past Medical History, Past Surgical History, Family History, and Social History were  reviewed in [employer] electronic medical record.  ROS  The following are not active complaints unless bolded Hoarseness, sore throat, dysphagia, dental problems, itching, sneezing,  nasal congestion or discharge of excess mucus or purulent secretions, ear ache,   fever, chills, sweats, unintended wt loss or wt gain, classically pleuritic or exertional cp,  orthopnea pnd or arm/hand swelling  or leg swelling, presyncope, palpitations, abdominal  pain, anorexia, nausea, vomiting, diarrhea  or change in bowel habits or change in bladder habits, change in stools or change in urine, dysuria, hematuria,  rash, arthralgias, visual complaints, headache, numbness, weakness or ataxia or problems with walking or coordination,  change in mood or  memory.        No outpatient medications have been marked as taking for the 08/21/22 encounter (Appointment) with Nyoka Cowden, MD.           Past Medical History:  Diagnosis Date   Diabetes mellitus    type II   Down syndrome    Family history of adverse reaction to anesthesia    mom has had n/v   Gastritis    GERD (gastroesophageal reflux disease)    Headache    Hepatic steatosis    Hidradenitis    Hypothyroidism    Irritable bowel syndrome    Periumbilical hernia    Pneumonia    Restless legs    Tendonitis    chronic in left foot   Thyroid disease    hypothyroidism       Physical Exam    08/21/2022       ***  07/16/2019       246   06/11/19 242 lb (109.8 kg)  06/01/19 241 lb 9.6 oz (109.6 kg)  05/21/19 257 lb 11.5 oz (116.9 kg)  04/21/19 248 lb (112.5 kg)  02/03/19 250 lb (113.4 kg)   Vital signs reviewed  08/21/2022  - Note at rest 02 sats  ***% on ***   General appearance:    ***    Assessment & Plan:

## 2022-08-21 ENCOUNTER — Ambulatory Visit: Payer: Medicare HMO | Admitting: Internal Medicine

## 2022-08-21 ENCOUNTER — Encounter: Payer: Self-pay | Admitting: Internal Medicine

## 2022-08-21 VITALS — BP 134/84 | HR 86 | Temp 97.9°F | Ht 64.0 in | Wt 265.8 lb

## 2022-08-21 DIAGNOSIS — E66813 Obesity, class 3: Secondary | ICD-10-CM

## 2022-08-21 DIAGNOSIS — G4734 Idiopathic sleep related nonobstructive alveolar hypoventilation: Secondary | ICD-10-CM

## 2022-08-21 DIAGNOSIS — R0609 Other forms of dyspnea: Secondary | ICD-10-CM | POA: Diagnosis not present

## 2022-08-21 MED ORDER — PANTOPRAZOLE SODIUM 40 MG PO TBEC: 40.0000 mg | DELAYED_RELEASE_TABLET | Freq: Every day | ORAL | 2 refills | Status: DC

## 2022-08-21 NOTE — Patient Instructions (Signed)
Pantoprazole (protonix) 40 mg   Take  30-60 min before first meal of the day and Pepcid (famotidine)  20 mg after supper until return to office - this is the best way to tell whether stomach acid is contributing to your problem.   GERD (REFLUX)  is an extremely common cause of respiratory symptoms just like yours , many times with no obvious heartburn at all.    It can be treated with medication, but also with lifestyle changes including elevation of the head of your bed (ideally with 6 -8inch blocks under the headboard of your bed),  Smoking cessation, avoidance of late meals, excessive alcohol, and avoid fatty foods, chocolate, peppermint, colas, red wine, and acidic juices such as orange juice.  NO MINT OR MENTHOL PRODUCTS SO NO COUGH DROPS  USE SUGARLESS CANDY INSTEAD (Jolley ranchers or Stover's or Life Savers) or even ice chips will also do - the key is to swallow to prevent all throat clearing. NO OIL BASED VITAMINS - use powdered substitutes.  Avoid fish oil when coughing.

## 2022-08-21 NOTE — Assessment & Plan Note (Addendum)
Body mass index is 45.62 kg/m.  -  trending up  Lab Results  Component Value Date   TSH 4.89 06/28/2022      Contributing to doe and risk of GERD/DVT/PE >>>   reviewed the need and the process to achieve and maintain neg calorie balance (no chocolate)  > defer f/u primary care including intermittently monitoring thyroid status     Each maintenance medication was reviewed in detail including emphasizing most importantly the difference between maintenance and prns and under what circumstances the prns are to be triggered using an action plan format where appropriate.  Total time for H and P, chart review, counseling,  directly observing portions of ambulatory 02 saturation study/ and generating customized AVS unique to this office visit / same day charting = 60 min with pt not seen in > 3 y

## 2022-08-21 NOTE — Assessment & Plan Note (Addendum)
Onset ? Xmas  2023 with pseudowheeze on exam 08/21/2022  - 08/21/2022   Walked on RA  x  2  lap(s) =  approx 500  ft  @ slow/cane pace, stopped due to back/knee  pain  with lowest 02 sats 93%   - 08/21/2022 rec  gerd rx x 6 week - ONO on RA 08/21/2022 >>>   Symptoms are   disproportionate to objective findings and not clear to what extent this is actually a pulmonary  problem but pt does appear to have difficult to sort out respiratory symptoms of unknown origin for which  DDX  = almost all start with A and  include Adherence, Ace Inhibitors, Acid Reflux, Active Sinus Disease, Alpha 1 Antitripsin deficiency, Anxiety masquerading as Airways dz,  ABPA,  Allergy(esp in young), Aspiration (esp in elderly), Adverse effects of meds,  Active smoking or Vaping, A bunch of PE's/clot burden (a few small clots can't cause this syndrome unless there is already severe underlying pulm or vascular dz with poor reserve),  Anemia or thyroid disorder, plus two Bs  = Bronchiectasis and Beta blocker use..and one C= CHF     Most likely dx in BOLD and of these most have been excluded x for gerd, which would also account for the pseudowheeze on exam.  Obesity and deconditioning are clearly also concerns but not so easily eliminated as she has limited activity tol due to back/knee > strongly suggested water aerobics  F/u in 6 weeks, sooner if needed

## 2022-08-22 ENCOUNTER — Other Ambulatory Visit: Payer: Self-pay | Admitting: Family Medicine

## 2022-08-22 MED ORDER — DOXYCYCLINE HYCLATE 100 MG PO TABS
100.0000 mg | ORAL_TABLET | Freq: Two times a day (BID) | ORAL | 0 refills | Status: DC
Start: 2022-08-22 — End: 2022-11-27

## 2022-08-22 NOTE — Progress Notes (Signed)
Received a note from her dad Dr Janace Hoard that Wanda Oneill has a paronychia, would like doxycycline called in  Meds ordered this encounter  Medications   doxycycline (VIBRA-TABS) 100 MG tablet    Sig: Take 1 tablet (100 mg total) by mouth 2 (two) times daily.    Dispense:  20 tablet    Refill:  0   I have asked him to let me know if not improving as expected

## 2022-08-28 ENCOUNTER — Other Ambulatory Visit (HOSPITAL_BASED_OUTPATIENT_CLINIC_OR_DEPARTMENT_OTHER): Payer: Self-pay | Admitting: Obstetrics & Gynecology

## 2022-08-28 DIAGNOSIS — N926 Irregular menstruation, unspecified: Secondary | ICD-10-CM

## 2022-08-29 DIAGNOSIS — G4730 Sleep apnea, unspecified: Secondary | ICD-10-CM | POA: Diagnosis not present

## 2022-08-29 DIAGNOSIS — R0902 Hypoxemia: Secondary | ICD-10-CM | POA: Diagnosis not present

## 2022-09-04 ENCOUNTER — Telehealth: Payer: Self-pay | Admitting: Internal Medicine

## 2022-09-04 DIAGNOSIS — G4734 Idiopathic sleep related nonobstructive alveolar hypoventilation: Secondary | ICD-10-CM

## 2022-09-04 NOTE — Telephone Encounter (Signed)
Desat on RA on ONO so needs 2lpm and repeat ONO on 2lpm

## 2022-09-06 ENCOUNTER — Other Ambulatory Visit (HOSPITAL_BASED_OUTPATIENT_CLINIC_OR_DEPARTMENT_OTHER): Payer: Self-pay | Admitting: *Deleted

## 2022-09-06 DIAGNOSIS — N926 Irregular menstruation, unspecified: Secondary | ICD-10-CM

## 2022-09-06 MED ORDER — NORETHINDRONE ACETATE 5 MG PO TABS
ORAL_TABLET | ORAL | 3 refills | Status: DC
Start: 2022-09-06 — End: 2023-07-24

## 2022-09-09 NOTE — Telephone Encounter (Signed)
I called and spoke with pt's Mother and Father regarding the ONO results/recs  Order sent- facemask rather than [place] per pt request    Dr Sherene Sires, pt's father is asking what your thoughts are on pt needs sleep eval. Please advise, thanks!

## 2022-09-09 NOTE — Telephone Encounter (Signed)
Fine with me to order sleep medicine eval at mother's request

## 2022-09-12 DIAGNOSIS — E1165 Type 2 diabetes mellitus with hyperglycemia: Secondary | ICD-10-CM | POA: Diagnosis not present

## 2022-09-12 DIAGNOSIS — E781 Pure hyperglyceridemia: Secondary | ICD-10-CM | POA: Diagnosis not present

## 2022-09-12 DIAGNOSIS — E039 Hypothyroidism, unspecified: Secondary | ICD-10-CM | POA: Diagnosis not present

## 2022-09-12 NOTE — Telephone Encounter (Signed)
Lm x1 for patient's mother, Liza(DPR)

## 2022-09-16 NOTE — Telephone Encounter (Signed)
Order placed

## 2022-09-18 ENCOUNTER — Other Ambulatory Visit: Payer: Self-pay | Admitting: Family Medicine

## 2022-10-02 ENCOUNTER — Telehealth: Payer: Self-pay | Admitting: Family Medicine

## 2022-10-02 NOTE — Telephone Encounter (Signed)
Patient dropped off document  (verification form [employer] [place]) , to be filled out by provider. Patient requested to send it back via Mail (attached envelope included) within 2-days. Document is located in providers tray at front office.Please advise at Mobile [phone] (mobile)

## 2022-10-03 NOTE — Telephone Encounter (Signed)
Warden Fillers is aware.

## 2022-10-10 ENCOUNTER — Encounter: Payer: Self-pay | Admitting: Family Medicine

## 2022-10-23 IMAGING — DX DG LUMBAR SPINE COMPLETE 4+V
6 series · 6 of 6 positions shown · non-contrast
Comparison: 09/01/2018

CLINICAL DATA: Multiple fall back pain

EXAM:
LUMBAR SPINE - COMPLETE 4+ VIEW

[l-spine ap]
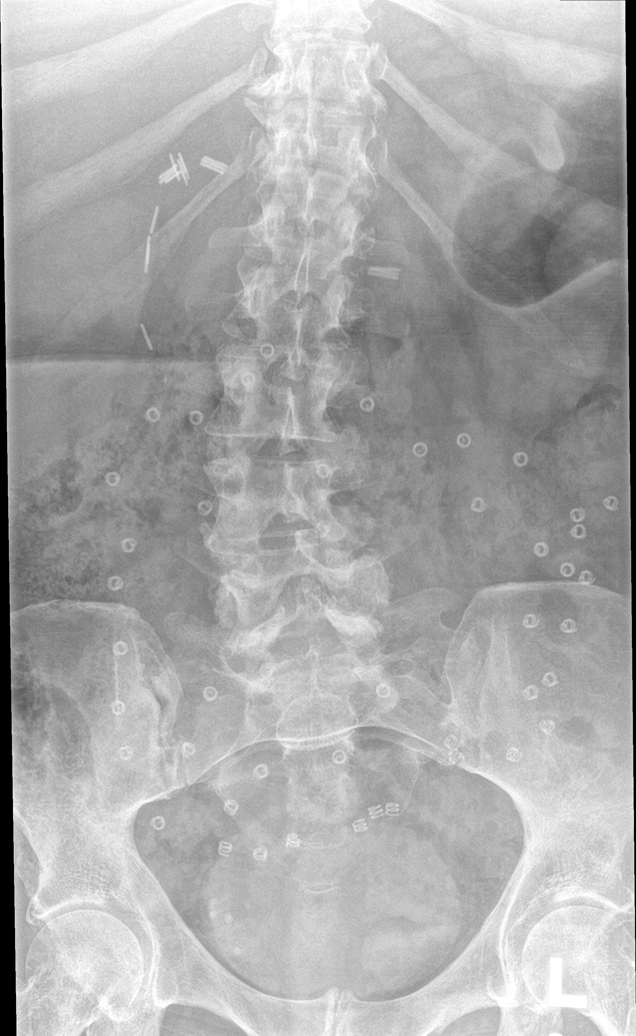

[l-spine obl (1 of 2)]
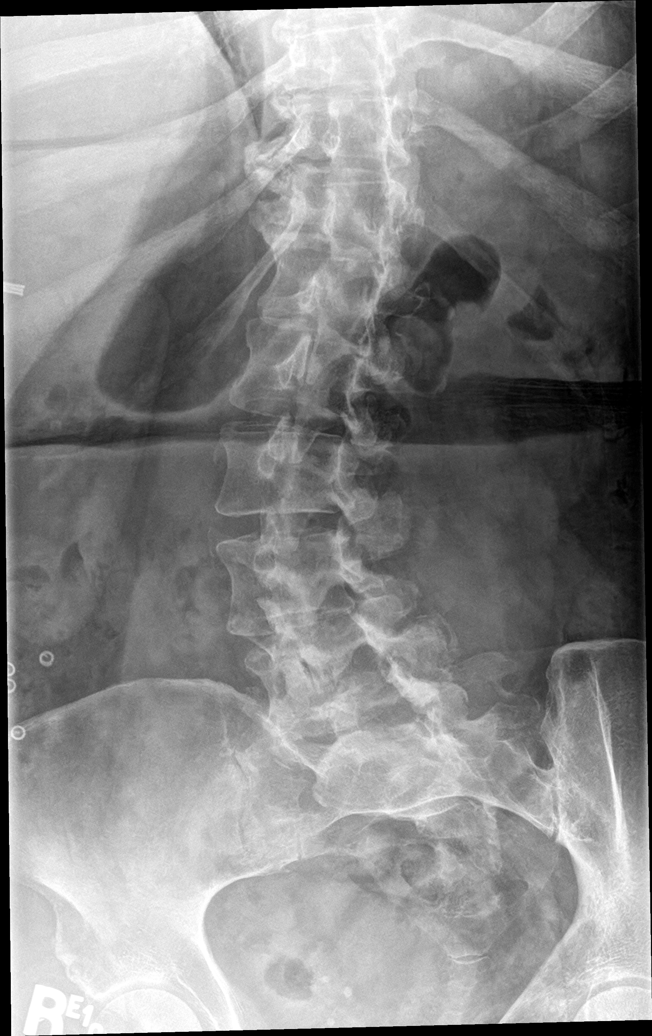

[l-spine obl (2 of 2)]
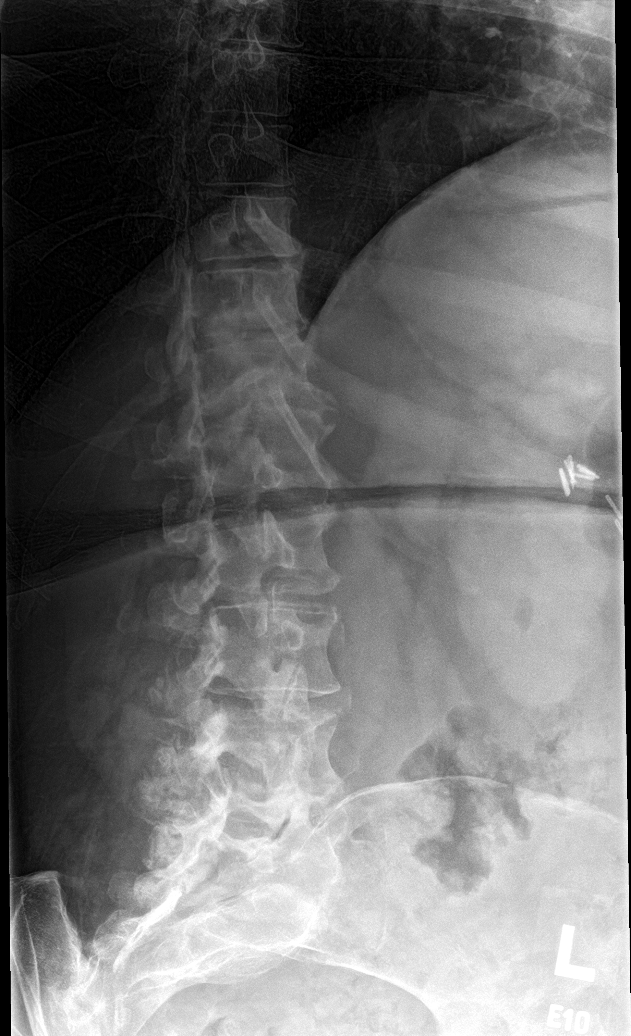

[l-spine lat (1 of 2)]
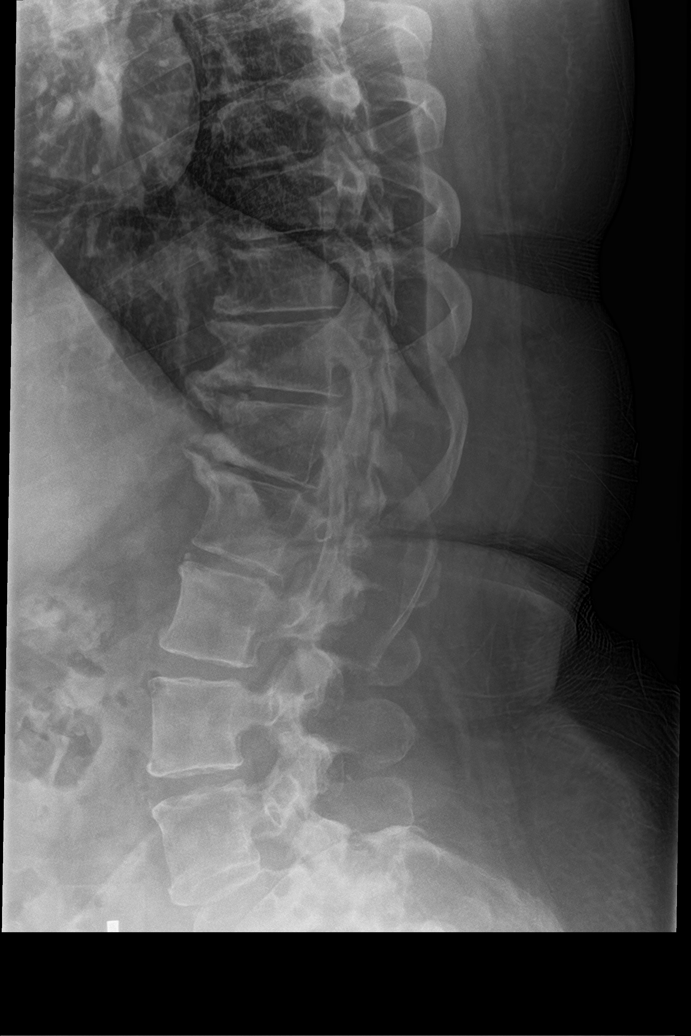

[l-spine spot]
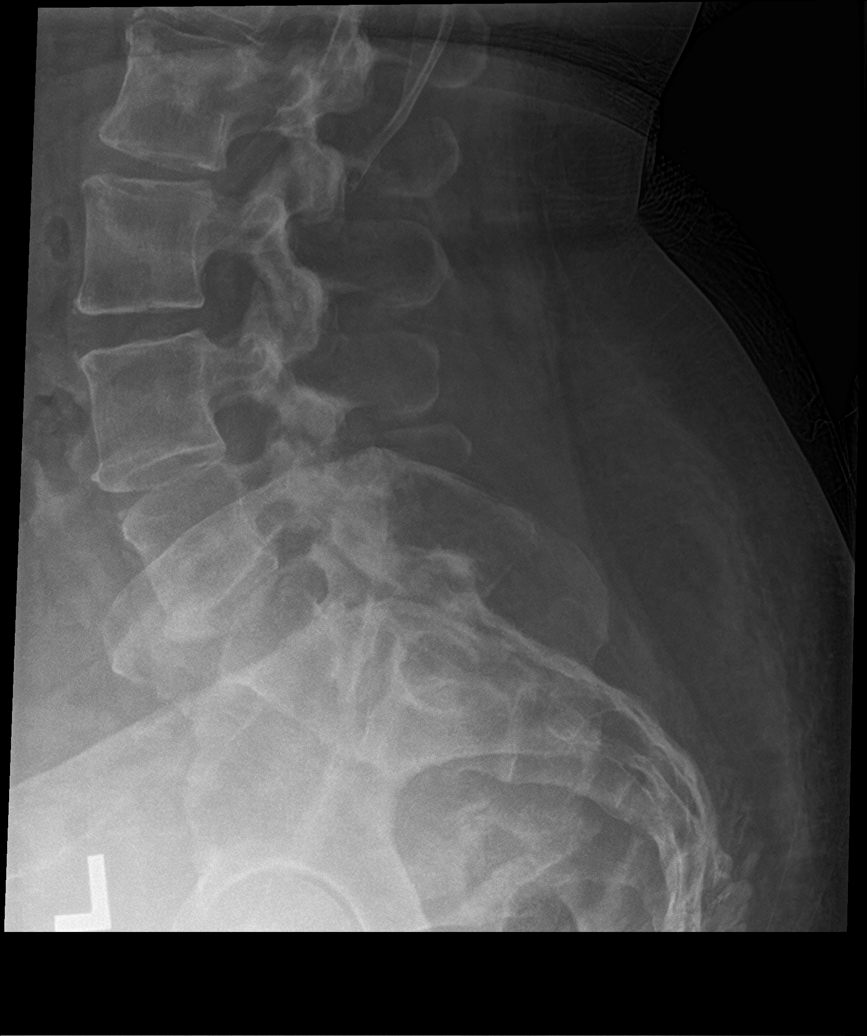

[l-spine lat (2 of 2)]
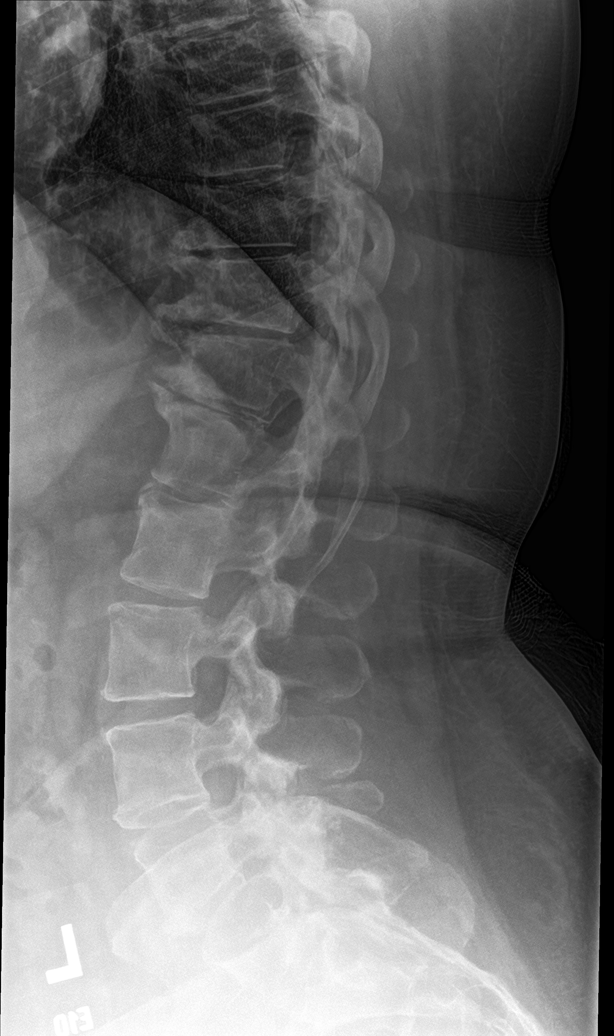

[6 of 6 positions shown; findings below may reference images not displayed]

FINDINGS: Transitional anatomy. For the purposes of reporting, transitional
segment will be lumbarized S1 and 5 lumbar type vertebra assumed.
Dextroscoliosis of the spine. Evidence of prior hernia repair.
Sagittal alignment within normal limits. Vertebral body heights are
stable with mild chronic anterior wedging at T11 and T12. Multilevel
degenerative change with mild disc space narrowing and degenerative
osteophyte. Facet degenerative changes at multiple levels.
IMPRESSION: 1. Scoliosis with multilevel mild degenerative change. Transitional
anatomy as before
2. Mild chronic wedging of T11 and T12

## 2022-10-25 ENCOUNTER — Other Ambulatory Visit: Payer: Self-pay | Admitting: Family Medicine

## 2022-10-29 ENCOUNTER — Encounter: Payer: Self-pay | Admitting: Internal Medicine

## 2022-11-03 NOTE — Progress Notes (Deleted)
@Patient  ID: Wanda Oneill, female    DOB: [birth year] .   MRN: [id number]   Referring provider: Pearline Cables, MD  HPI:  42  year-old female never smoker/ DOWN syndrome  initially consulted with our practice in April/2021 during 2 hospitalizations status post recent bone spur shaving right shoulder (4/15) w/ interscalene block w/ sub-acute elevated right HD felt either 2/2 phrenic nerve injury vs RML collapse.  PMH: Down  syndrome, NIDDD. GERD, Gastritis, Gout, MO (wt 110.kg w/ BMI 41.74) Smoker/ Smoking History: Never Smoker  Maintenance: None Pt of: Dr. Sherene Sires   06/11/2019  - NP office Visit  42 year old female never smoker initially consulted with our practice in April/15/2021.  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  Patient was later discharged on 05/23/2019.  Patient was O2 dependent and discharged with 4 L patient was requested to follow-up with Dr. Sherene Sires Dr. Delton Coombes in 2 weeks with a chest x-ray.  Unfortunately since discharge patient was not satting well on 05/26/2019 she was instructed to present to the emergency room by Dr. Sherene Sires for likely BiPAP.  Patient was then consulted again with our practice on 05/28/2019.  Patient was treated for acute hypoxic respiratory failure and bilateral infiltrates seem to be related to pneumonia most likely aspiration.  There is a concern for obstructive sleep apnea.  Patient was discharged on 06/01/2019.  During second hospitalization patient was recommended to change to a dysphagia 3 soft solid diet.  Patient was frustrated and unhappy with the limitations of dysphagia 3 she was to resume a regular solid diet.  Patient was changed back to regular solids to make sure that they precut meats per patient request in order to make self eating easier.  Patient was  encouraged to remain on PPI with reflux precautions. rec No change rx Consider sleep study     07/16/2019  f/u ov/Charizma Gardiner re: f/u pna/ temporary R HD paralysis p nerve block for shoulder surgery Chief Complaint  Patient presents with   Follow-up    occ cough in AM, no new SOB  Dyspnea:  Not limited by breathing from desired activities   Cough: variable first thing in am/ min mucoid  Sleeping: on 3 pillows  SABA use: none 02: none now Rec Make sure you check your oxygen saturations at highest level of activity to be sure it stays over 90%  -  Call if losing ground at all     08/21/2022  Re-establish  ov/[place] office/Shiraz Bastyr re: doe maint on no resp rx   Chief Complaint  Patient presents with   Consult    "Heavy breathing".  Exertion increases sx.  Easily falls asleep sitting in chair or in car.  Cardiology r/o CHF  Dyspnea:  tends to ride a cart at [facility] / limited by back and knees / 150 ft to MB with ramp stops due to sob and back / back limits bowling  Cough: none  Sleeping: flat bed/ lots of pillows  SABA use: none  02: none  Rec Pantoprazole (protonix) 40 mg   Take  30-60 min before first meal of the day and Pepcid (famotidine)  20 mg after supper until return to office  GERD diet reviewed, bed blocks rec .     11/04/2022  f/u ov/Arleigh Dicola re: DOE    maint on ***  No  chief complaint on file.   Dyspnea:  *** Cough: *** Sleeping: *** resp cc  SABA use: *** 02: ***  Lung cancer screening :  ***    No obvious day to day or daytime variability or assoc excess/ purulent sputum or mucus plugs or hemoptysis or cp or chest tightness, subjective wheeze or overt sinus or hb symptoms.    Also denies any obvious fluctuation of symptoms with weather or environmental changes or other aggravating or alleviating factors except as outlined above   No unusual exposure hx or h/o childhood pna/ asthma or knowledge of premature birth.  Current Allergies, Complete Past Medical History, Past  Surgical History, Family History, and Social History were reviewed in [employer] record.  ROS  The following are not active complaints unless bolded Hoarseness, sore throat, dysphagia, dental problems, itching, sneezing,  nasal congestion or discharge of excess mucus or purulent secretions, ear ache,   fever, chills, sweats, unintended wt loss or wt gain, classically pleuritic or exertional cp,  orthopnea pnd or arm/hand swelling  or leg swelling, presyncope, palpitations, abdominal pain, anorexia, nausea, vomiting, diarrhea  or change in bowel habits or change in bladder habits, change in stools or change in urine, dysuria, hematuria,  rash, arthralgias, visual complaints, headache, numbness, weakness or ataxia or problems with walking or coordination,  change in mood or  memory.        No outpatient medications have been marked as taking for the 11/04/22 encounter (Appointment) with Nyoka Cowden, MD.           Past Medical History:  Diagnosis Date   Diabetes mellitus    type II   Down syndrome    Family history of adverse reaction to anesthesia    mom has had n/v   Gastritis    GERD (gastroesophageal reflux disease)    Headache    Hepatic steatosis    Hidradenitis    Hypothyroidism    Irritable bowel syndrome    Periumbilical hernia    Pneumonia    Restless legs    Tendonitis    chronic in left foot   Thyroid disease    hypothyroidism       Physical Exam  Wts  11/04/2022        ***  08/21/2022       265   07/16/2019       246   06/11/19 242 lb (109.8 kg)  06/01/19 241 lb 9.6 oz (109.6 kg)  05/21/19 257 lb 11.5 oz (116.9 kg)  04/21/19 248 lb (112.5 kg)  02/03/19 250 lb (113.4 kg)    Vital signs reviewed  11/04/2022  - Note at rest 02 sats  ***% on ***   General appearance:    ***    HEENT : Oropharynx M4 airway   limited inspiratory excursion in the supine position***       Assessment & Plan:

## 2022-11-04 ENCOUNTER — Ambulatory Visit: Payer: Medicare HMO | Admitting: Internal Medicine

## 2022-11-08 ENCOUNTER — Encounter: Payer: Self-pay | Admitting: Internal Medicine

## 2022-11-13 DIAGNOSIS — G4730 Sleep apnea, unspecified: Secondary | ICD-10-CM | POA: Diagnosis not present

## 2022-11-18 ENCOUNTER — Other Ambulatory Visit: Payer: Self-pay | Admitting: Family Medicine

## 2022-11-27 ENCOUNTER — Ambulatory Visit (HOSPITAL_BASED_OUTPATIENT_CLINIC_OR_DEPARTMENT_OTHER)
Admission: RE | Admit: 2022-11-27 | Discharge: 2022-11-27 | Disposition: A | Discharge status: Home / Self Care | Payer: Medicare HMO | Source: Ambulatory Visit | Attending: Family Medicine | Admitting: Family Medicine

## 2022-11-27 ENCOUNTER — Ambulatory Visit (INDEPENDENT_AMBULATORY_CARE_PROVIDER_SITE_OTHER): Payer: Medicare HMO | Admitting: Family Medicine

## 2022-11-27 ENCOUNTER — Encounter: Payer: Self-pay | Admitting: Family Medicine

## 2022-11-27 VITALS — BP 124/78 | HR 85 | Temp 98.5°F | Resp 22 | Wt 277.0 lb

## 2022-11-27 DIAGNOSIS — R052 Subacute cough: Secondary | ICD-10-CM | POA: Diagnosis not present

## 2022-11-27 DIAGNOSIS — R0602 Shortness of breath: Secondary | ICD-10-CM | POA: Diagnosis not present

## 2022-11-27 DIAGNOSIS — R059 Cough, unspecified: Secondary | ICD-10-CM | POA: Diagnosis not present

## 2022-11-27 MED ORDER — ALBUTEROL SULFATE HFA 108 (90 BASE) MCG/ACT IN AERS: 2.0000 | INHALATION_SPRAY | Freq: Four times a day (QID) | RESPIRATORY_TRACT | 2 refills | Status: DC | PRN

## 2022-11-27 MED ORDER — ARNUITY ELLIPTA 100 MCG/ACT IN AEPB: 1.0000 | INHALATION_SPRAY | Freq: Every day | RESPIRATORY_TRACT | 12 refills | Status: DC

## 2022-11-27 NOTE — Patient Instructions (Addendum)
It was good to see you again today- I will be in touch with your labs ans x-ray.  I will call [facility] about a steroid inhaler for you  Please seek care if you are getting worse!

## 2022-11-27 NOTE — Progress Notes (Signed)
[facility] [address], [address] [place], [place] [address] [phone]  Date:  11/27/2022   Name:  Wanda Oneill   DOB:  [birth year]   MRN:  [id number]  PCP:  Pearline Cables, MD    Chief Complaint: Cough (Worse when lying down. She does see Pulm and has had a sleep study. )  History of Present Illness:  Wanda Oneill is a 42 y.o. very pleasant female patient who presents with the following:  Patient seen today for sick visit Most recent visit with myself was in March of this year History of diabetes, hypothyroidism, obesity, Down syndrome and several associated MSK problems   She was seen by her pulmonologist, Dr. Sherene Sires in July She is seeing a narcolepsy specialist within the next week She did complete a sleep study at home recently but we is not back yet She is not using CPAP- she is on 2L of oxygen while asleep since her most recent visit with pulmonology - she uses just at night or when she is napping but not during waking times  Wanda Oneill notes a cough for about 2 months She got sick with a viral infection or something similar when this started, just has not seemed to go away The cough can be productive  She will have intermittent cough fits-these are more frequent at night when she is supine No fever noted, she is using cough drops as needed  Her mother Wanda Oneill is with her today.  She notes [name]'s breathing symptoms have been stable for about the last 2 months, she is not acutely worse  Reviewed note from Dr. Sherene Sires 7/17-it looks like at that time they noted worsening of her breathing since holidays 2023.  They had her walk the halls, lowest oxygen saturation 93%  Possibility of CHF-noted increase in weight as below She is not having particularly increased lower extremity swelling Also, she has recently had an echo this past July which did not show any significant heart failure or other issue Wt Readings from Last 3  Encounters:  11/27/22 277 lb (125.6 kg)  08/21/22 265 lb 12.8 oz (120.6 kg)  07/05/22 252 lb 9.6 oz (114.6 kg)   Weight is up -however, she has been using more insulin recently as they try to control her blood sugar.  She actually just changed to an insulin pump this week, we hope this will allow her to decrease her overall insulin dependence.  She is also using Mounjaro 10 mg daily  She is UTD on flu and covid     Patient Active Problem List   Diagnosis Date Noted   DOE (dyspnea on exertion) 08/21/2022   Furuncles 02/08/2022   Infection of flexor tendon sheath 02/06/2022   Finger osteomyelitis, right (HCC) 02/06/2022   Leukocytosis 02/06/2022   History of COVID-19 02/06/2022   IBS (irritable bowel syndrome) 02/06/2022   Hypertriglyceridemia 02/06/2022   Obesity, Class III, BMI 40-49.9 (morbid obesity) (HCC) 02/06/2022   Chronic right-sided low back pain 11/02/2021   Bilateral trigger thumb 06/18/2021   Chronic paronychia of finger 01/25/2021   S/P left unicompartmental knee replacement, lateral 06/29/2020   At risk for obstructive sleep apnea 06/11/2019   Cough 06/11/2019   Hyperglycemia 05/26/2019   Acute hypoxemic respiratory failure (HCC) 05/20/2019   Fibromyalgia 03/09/2019   Neck pain, chronic 03/09/2019   Acute shoulder bursitis, right 02/03/2019   Bile salt-induced diarrhea 09/25/2018   Arthritis of left acromioclavicular  joint 09/23/2018   Precordial chest pain 08/11/2018   Morbid obesity (HCC) 10/31/2017   S/P right UKR, lateral 10/30/2017   Incarcerated epigastric hernia 05/30/2015   Left foot pain 08/09/2014   Chronic diarrhea 07/27/2014   Gout 08/10/2012   Acid reflux 01/22/2012   Down syndrome 11/11/2011   Hernia of abdominal wall 10/21/2011   Psoriasis 10/21/2011   Uncontrolled type 2 diabetes mellitus with hyperglycemia, with long-term current use of insulin (HCC) 04/12/2011   Hypothyroid 04/12/2011   Gait abnormality 06/22/2010   Tibial tendinitis,  posterior 06/22/2010    Past Medical History:  Diagnosis Date   Ambulates with cane    4 prong   Diabetes mellitus    type II   Down syndrome    Down's syndrome    Family history of adverse reaction to anesthesia    mom has had n/v   Gastritis    Headache    Hepatic steatosis    Hidradenitis    Hypothyroidism    Irritable bowel syndrome    Osteoarthritis    Periumbilical hernia    Pneumonia 03/2020   frequent    Restless legs    Sleep apnea    Tendonitis    chronic in left foot   Thyroid disease    hypothyroidism   Urinary incontinence 02/2022   some   UTI (urinary tract infection)    just dx 04/2022 - rx amoxicillin    Past Surgical History:  Procedure Laterality Date   AMPUTATION Right 04/11/2022   Procedure: RIGHT LONG FINGER AMPUTATION;  Surgeon: Betha Loa, MD;  Location: [facility] OR;  Service: Orthopedics;  Laterality: Right;   AXILLARY HIDRADENITIS EXCISION Bilateral    CHOLECYSTECTOMY     HERNIA REPAIR     multiple   INCISION AND DRAINAGE ABSCESS Right 02/06/2022   Procedure: INCISION AND DRAINAGE RIGHT HAND;  Surgeon: Betha Loa, MD;  Location: [facility] OR;  Service: Orthopedics;  Laterality: Right;   INCISION AND DRAINAGE WOUND WITH NAILBED REPAIR Left 02/06/2022   Procedure: INCISION AND DRAINAGE LEFT INDEX FINGER;  Surgeon: Betha Loa, MD;  Location: [facility] OR;  Service: Orthopedics;  Laterality: Left;   INCISIONAL HERNIA REPAIR N/A 05/30/2015   Procedure: LAPAROSCOPIC INCISIONAL HERNIA;  Surgeon: Emelia Loron, MD;  Location: [facility] OR;  Service: General;  Laterality: N/A;   INSERTION OF MESH N/A 05/30/2015   Procedure: INSERTION OF MESH;  Surgeon: Emelia Loron, MD;  Location: [facility] OR;  Service: General;  Laterality: N/A;   KNEE ARTHROSCOPY     right knee   LAPAROSCOPY N/A 05/30/2015   Procedure: LAPAROSCOPY DIAGNOSTIC;  Surgeon: Emelia Loron, MD;  Location: [facility] OR;  Service: General;  Laterality: N/A;   PARTIAL KNEE ARTHROPLASTY Right 10/30/2017    Procedure: UNICOMPARTMENTAL RIGHT KNEE LATERAL;  Surgeon: Durene Romans, MD;  Location: [facility] ORS;  Service: Orthopedics;  Laterality: Right;  90 mins   PARTIAL KNEE ARTHROPLASTY Left 06/29/2020   Procedure: UNICOMPARTMENTAL KNEE LATERALLY;  Surgeon: Durene Romans, MD;  Location: [facility] ORS;  Service: Orthopedics;  Laterality: Left;  70 MINS   TONSILLECTOMY     adenoids    Social History   Tobacco Use   Smoking status: Never    Passive exposure: Never   Smokeless tobacco: Never  Vaping Use   Vaping status: Never Used  Substance Use Topics   Alcohol use: No    Alcohol/week: 0.0 standard drinks of alcohol   Drug use: No    Family History  Problem Relation Age of Onset  Thyroid cancer Mother    Diabetes type II Mother    High Cholesterol Mother    Heart disease Mother    Diabetes type II Father    Hypertension Father    Stroke Father     Allergies  Allergen Reactions   Vancomycin Itching   Cefuroxime Axetil Hives   Sulfa Antibiotics Hives    Medication list has been reviewed and updated.  Current Outpatient Medications on File Prior to Visit  Medication Sig Dispense Refill   allopurinol (ZYLOPRIM) 300 MG tablet TAKE 1 TABLET EVERY DAY 90 tablet 10   Blood Glucose Monitoring Suppl (BLOOD GLUCOSE METER KIT AND SUPPLIES) Dispense based on patient and insurance preference. To check blood sugars daily FOR ICD-9 250.00 1 each 0   cholestyramine (QUESTRAN) 4 g packet MIX 1 PACKET IN LIQUID AND DRINK TWICE DAILY (Patient taking differently: Take 4 g by mouth 2 (two) times daily.) 180 packet 3   Continuous Glucose Sensor (DEXCOM G6 SENSOR) MISC SMARTSIG:1 Each Topical Every 10 Days     Continuous Glucose Transmitter (DEXCOM G6 TRANSMITTER) MISC      dicyclomine (BENTYL) 20 MG tablet Take 1-2 tablets (20-40 mg total) by mouth every 8 (eight) hours as needed for spasms. 100 tablet 1   diphenhydramine-acetaminophen (TYLENOL PM) 25-500 MG TABS tablet Take 500 mg by mouth at bedtime.      fenofibrate micronized (LOFIBRA) 134 MG capsule Take 134 mg by mouth daily before breakfast.     Glucose Blood (BLOOD GLUCOSE TEST STRIPS) STRP 1 each by Other route See admin instructions. Use to test blood sugar up to twice a day 100 strip 3   glucose blood (FREESTYLE TEST STRIPS) test strip Use as directed to test blood sugar up to twice a day. Dx code E11.9 100 each 12   glucose blood (TRUE METRIX BLOOD GLUCOSE TEST) test strip Use as instructed to check glucose up to 2 times daily. Dx Code E11.9 100 each 12   Insulin Disposable Pump (OMNIPOD 5 DEXG7G6 INTRO GEN 5) KIT SMARTSIG:SUB-Q Every Other Day     Insulin Disposable Pump (OMNIPOD 5 DEXG7G6 PODS GEN 5) MISC Inject into the skin as directed.     JARDIANCE 25 MG TABS tablet TAKE 1 TABLET EVERY DAY 90 tablet 3   levothyroxine (SYNTHROID) 150 MCG tablet Take 1 tablet (150 mcg total) by mouth daily before breakfast. 30 tablet 0   metFORMIN (GLUCOPHAGE) 1000 MG tablet Take 1 tablet (1,000 mg total) by mouth 2 (two) times daily with a meal. 60 tablet 0   methocarbamol (ROBAXIN) 500 MG tablet Take 1 tablet (500 mg total) by mouth every 8 (eight) hours as needed for muscle spasms. 30 tablet 1   MOUNJARO 10 MG/0.5ML Pen Inject into the skin.     naproxen sodium (ALEVE) 220 MG tablet Take 440 mg by mouth daily.     norethindrone (AYGESTIN) 5 MG tablet TAKE 2 TABLET (10 MG TOTAL) BY MOUTH IN THE MORNING AND AT 1 TABLET BEDTIME. 270 tablet 3   ondansetron (ZOFRAN) 4 MG tablet Take 1 tablet (4 mg total) by mouth every 8 (eight) hours as needed for nausea or vomiting. 20 tablet 0   ONE TOUCH ULTRA TEST test strip USE TO CHECK BLOOD SUGAR ONCE DAILY (ICD CODE:25.00) 100 each 3   pantoprazole (PROTONIX) 40 MG tablet Take 1 tablet (40 mg total) by mouth daily. Take 30-60 min before first meal of the day 30 tablet 2   TRUEplus Lancets 30G MISC  Use as directed to check glucose up to two times daily. Dx code E11.9 100 each 3   No current facility-administered  medications on file prior to visit.    Review of Systems:  As per HPI- otherwise negative.   Physical Examination: Vitals:   11/27/22 1343  BP: 124/78  Pulse: 85  Resp: (!) 22  Temp: 98.5 F (36.9 C)  SpO2: 98%   Vitals:   11/27/22 1343  Weight: 277 lb (125.6 kg)   Body mass index is 47.55 kg/m. Ideal Body Weight:    GEN: no acute distress.  Appears her normal self, obese, pleasant.  She is able to speak in complete sentences without pausing to catch her breath HEENT: Atraumatic, Normocephalic.  Ears and Nose: No external deformity. CV: RRR, No M/G/R. No JVD. No thrill. No extra heart sounds. PULM: CTA B, no wheezes, crackles, rhonchi. No retractions. No resp. distress. No accessory muscle use. ABD: S, NT, ND EXTR: No c/c/e PSYCH: Normally interactive. Conversant.    Assessment and Plan: Subacute cough - Plan: DG Chest 2 View, albuterol (VENTOLIN HFA) 108 (90 Base) MCG/ACT inhaler, Basic metabolic panel, CBC, B Nat Peptide, Fluticasone Furoate (ARNUITY ELLIPTA) 100 MCG/ACT AEPB  Patient seen today with concern of worsening respiratory symptoms, especially cough for 2 months, possibly longer.  She is now wearing oxygen while asleep.  Will obtain a chest x-ray to rule out pneumonia and also evaluate for any pulmonary effusion.  Gave her albuterol to try as well as a steroid inhaler.  Would like to avoid oral steroids if we can because her blood sugars have been so high.  She recently completed a sleep study and is awaiting his results, CPAP may be indicated.  They also notes she is seeing a narcolepsy specialist within the next week or so  Signed Abbe Amsterdam, MD  Received chest film as below, message to patient  DG Chest 2 View  Result Date: 11/27/2022 CLINICAL DATA:  Shortness of breath, cough EXAM: CHEST - 2 VIEW COMPARISON:  06/28/2022 FINDINGS: Prominent perihilar and bibasilar interstitial markings as before. Heart size and mediastinal contours are within  normal limits. No effusion. Visualized bones unremarkable. IMPRESSION: Prominent perihilar and bibasilar interstitial markings. Electronically Signed   By: Corlis Leak M.D.   On: 11/27/2022 16:52    Addendum 10/24, received labs as below.  Message to patient  Results for orders placed or performed in visit on 11/27/22  Basic metabolic panel  Result Value Ref Range   Sodium 128 (L) 135 - 145 mEq/L   Potassium 4.8 3.5 - 5.1 mEq/L   Chloride 91 (L) 96 - 112 mEq/L   CO2 30 19 - 32 mEq/L   Glucose, Bld 342 (H) 70 - 99 mg/dL   BUN 8 6 - 23 mg/dL   Creatinine, Ser 9.60 0.40 - 1.20 mg/dL   GFR 454.09 >81.19 mL/min   Calcium 8.6 8.4 - 10.5 mg/dL  CBC  Result Value Ref Range   WBC 8.0 4.0 - 10.5 K/uL   RBC 4.94 3.87 - 5.11 Mil/uL   Platelets 440.0 (H) 150.0 - 400.0 K/uL   Hemoglobin 11.8 (L) 12.0 - 15.0 g/dL   HCT 14.7 82.9 - 56.2 %   MCV 79.3 78.0 - 100.0 fl   MCHC 30.0 30.0 - 36.0 g/dL   RDW 13.0 (H) 86.5 - 78.4 %  B Nat Peptide  Result Value Ref Range   Pro B Natriuretic peptide (BNP) 24.0 0.0 - 100.0 pg/mL   Sodium  corrected for hyperglycemia 133.8

## 2022-11-28 ENCOUNTER — Encounter: Payer: Self-pay | Admitting: Family Medicine

## 2022-11-28 LAB — BASIC METABOLIC PANEL
BUN: 8 mg/dL (ref 6–23)
CO2: 30 meq/L (ref 19–32)
Calcium: 8.6 mg/dL (ref 8.4–10.5)
Chloride: 91 meq/L — ABNORMAL LOW (ref 96–112)
Creatinine, Ser: 0.68 mg/dL (ref 0.40–1.20)
GFR: 107.02 mL/min (ref >60.00)
Glucose, Bld: 342 mg/dL — ABNORMAL HIGH (ref 70–99)
Potassium: 4.8 meq/L (ref 3.5–5.1)
Sodium: 128 meq/L — ABNORMAL LOW (ref 135–145)

## 2022-11-28 LAB — CBC
HCT: 39.2 % (ref 36.0–46.0)
Hemoglobin: 11.8 g/dL — ABNORMAL LOW (ref 12.0–15.0)
MCHC: 30 g/dL (ref 30.0–36.0)
MCV: 79.3 fL (ref 78.0–100.0)
Platelets: 440 10*3/uL — ABNORMAL HIGH (ref 150.0–400.0)
RBC: 4.94 Mil/uL (ref 3.87–5.11)
RDW: 19.9 % — ABNORMAL HIGH (ref 11.5–15.5)
WBC: 8 10*3/uL (ref 4.0–10.5)

## 2022-11-28 LAB — BRAIN NATRIURETIC PEPTIDE: Pro B Natriuretic peptide (BNP): 24 pg/mL (ref 0.0–100.0)

## 2022-12-01 ENCOUNTER — Other Ambulatory Visit: Payer: Self-pay | Admitting: Family Medicine

## 2022-12-01 MED ORDER — SPACER/AERO-HOLDING CHAMBERS DEVI: 99 refills | Status: DC

## 2022-12-05 DIAGNOSIS — G4710 Hypersomnia, unspecified: Secondary | ICD-10-CM | POA: Diagnosis not present

## 2022-12-05 DIAGNOSIS — Z6841 Body Mass Index (BMI) 40.0 and over, adult: Secondary | ICD-10-CM | POA: Diagnosis not present

## 2022-12-05 DIAGNOSIS — G4733 Obstructive sleep apnea (adult) (pediatric): Secondary | ICD-10-CM | POA: Diagnosis not present

## 2022-12-05 DIAGNOSIS — E66813 Obesity, class 3: Secondary | ICD-10-CM | POA: Diagnosis not present

## 2022-12-09 ENCOUNTER — Other Ambulatory Visit: Payer: Self-pay | Admitting: Family Medicine

## 2022-12-11 ENCOUNTER — Other Ambulatory Visit: Payer: Self-pay | Admitting: Family Medicine

## 2022-12-12 ENCOUNTER — Encounter: Payer: Self-pay | Admitting: Internal Medicine

## 2022-12-12 DIAGNOSIS — G4733 Obstructive sleep apnea (adult) (pediatric): Secondary | ICD-10-CM | POA: Insufficient documentation

## 2022-12-22 ENCOUNTER — Other Ambulatory Visit: Payer: Self-pay | Admitting: Family Medicine

## 2022-12-23 ENCOUNTER — Other Ambulatory Visit: Payer: Self-pay | Admitting: Family Medicine

## 2022-12-24 ENCOUNTER — Encounter: Payer: Self-pay | Admitting: Occupational Therapy

## 2022-12-24 ENCOUNTER — Ambulatory Visit: Payer: Medicare HMO | Attending: Orthopedic Surgery | Admitting: Occupational Therapy

## 2022-12-24 DIAGNOSIS — M25642 Stiffness of left hand, not elsewhere classified: Secondary | ICD-10-CM | POA: Insufficient documentation

## 2022-12-24 DIAGNOSIS — R278 Other lack of coordination: Secondary | ICD-10-CM | POA: Insufficient documentation

## 2022-12-24 DIAGNOSIS — M6281 Muscle weakness (generalized): Secondary | ICD-10-CM | POA: Diagnosis not present

## 2022-12-24 DIAGNOSIS — M25641 Stiffness of right hand, not elsewhere classified: Secondary | ICD-10-CM | POA: Insufficient documentation

## 2022-12-24 DIAGNOSIS — R208 Other disturbances of skin sensation: Secondary | ICD-10-CM | POA: Insufficient documentation

## 2022-12-24 NOTE — Patient Instructions (Signed)
Flexor Tendon Gliding (Active Hook Fist)   MP Flexion (Active)   With back of hand on table, bend large knuckles as far as they will go, keeping small joints straight. Repeat _10-15___ times. Do __2__ sessions per day. Activity: Reach into a narrow container.*      Finger Flexion / Extension   With palm up, bend fingers of left hand toward palm, making a  fist. Straighten fingers, opening fist. Repeat sequence _10-15___ times per session. Do 2__ sessions per day. Hand Variation: Palm down   AROM: PIP Flexion / Extension   Pinch bottom knuckle of ___middle finger of right  and left finger of hand to prevent bending. Actively bend middle knuckle until stretch is felt. Hold __5__ seconds. Relax. Straighten finger as far as possible. Repeat __10-15__ times per set. Do _2__ sessions per day.   AROM: DIP Flexion / Extension    Copyright  VHI. All rights reserved.

## 2022-12-24 NOTE — Therapy (Signed)
OUTPATIENT OCCUPATIONAL THERAPY ORTHO EVALUATION  Patient Name: Wanda Oneill MRN: [id number] DOB:Aug 14, 1980, 42 y.o., female Today's Date: 12/24/2022  PCP: Dr. Patsy Lager REFERRING PROVIDER: Dr. Merlyn Lot  END OF SESSION:  OT End of Session - 12/24/22 1348     Visit Number 1    Number of Visits 10    Date for OT Re-Evaluation 03/04/23    Authorization Type [employer], MCD    OT Start Time 1233    OT Stop Time 1313    OT Time Calculation (min) 40 min             Past Medical History:  Diagnosis Date   Ambulates with cane    4 prong   Diabetes mellitus    type II   Down syndrome    Down's syndrome    Family history of adverse reaction to anesthesia    mom has had n/v   Gastritis    Headache    Hepatic steatosis    Hidradenitis    Hypothyroidism    Irritable bowel syndrome    Osteoarthritis    Periumbilical hernia    Pneumonia 03/2020   frequent    Restless legs    Sleep apnea    Tendonitis    chronic in left foot   Thyroid disease    hypothyroidism   Urinary incontinence 02/2022   some   UTI (urinary tract infection)    just dx 04/2022 - rx amoxicillin   Past Surgical History:  Procedure Laterality Date   AMPUTATION Right 04/11/2022   Procedure: RIGHT LONG FINGER AMPUTATION;  Surgeon: Betha Loa, MD;  Location: [facility] OR;  Service: Orthopedics;  Laterality: Right;   AXILLARY HIDRADENITIS EXCISION Bilateral    CHOLECYSTECTOMY     HERNIA REPAIR     multiple   INCISION AND DRAINAGE ABSCESS Right 02/06/2022   Procedure: INCISION AND DRAINAGE RIGHT HAND;  Surgeon: Betha Loa, MD;  Location: [facility] OR;  Service: Orthopedics;  Laterality: Right;   INCISION AND DRAINAGE WOUND WITH NAILBED REPAIR Left 02/06/2022   Procedure: INCISION AND DRAINAGE LEFT INDEX FINGER;  Surgeon: Betha Loa, MD;  Location: [facility] OR;  Service: Orthopedics;  Laterality: Left;   INCISIONAL HERNIA REPAIR N/A 05/30/2015   Procedure: LAPAROSCOPIC INCISIONAL HERNIA;  Surgeon: Emelia Loron, MD;   Location: [facility] OR;  Service: General;  Laterality: N/A;   INSERTION OF MESH N/A 05/30/2015   Procedure: INSERTION OF MESH;  Surgeon: Emelia Loron, MD;  Location: [facility] OR;  Service: General;  Laterality: N/A;   KNEE ARTHROSCOPY     right knee   LAPAROSCOPY N/A 05/30/2015   Procedure: LAPAROSCOPY DIAGNOSTIC;  Surgeon: Emelia Loron, MD;  Location: [facility] OR;  Service: General;  Laterality: N/A;   PARTIAL KNEE ARTHROPLASTY Right 10/30/2017   Procedure: UNICOMPARTMENTAL RIGHT KNEE LATERAL;  Surgeon: Durene Romans, MD;  Location: [facility] ORS;  Service: Orthopedics;  Laterality: Right;  90 mins   PARTIAL KNEE ARTHROPLASTY Left 06/29/2020   Procedure: UNICOMPARTMENTAL KNEE LATERALLY;  Surgeon: Durene Romans, MD;  Location: [facility] OR[facility];  Service: Orthopedics;  Laterality: Left;  70 MINS   TONSILLECTOMY     adenoids   Patient Active Problem List   Diagnosis Date Noted   OSA on CPAP 12/12/2022   DOE (dyspnea on exertion) 08/21/2022   Furuncles 02/08/2022   Infection of flexor tendon sheath 02/06/2022   Finger osteomyelitis, right (HCC) 02/06/2022   Leukocytosis 02/06/2022   History of COVID-19 02/06/2022   IBS (irritable bowel syndrome) 02/06/2022   Hypertriglyceridemia 02/06/2022  Obesity, Class III, BMI 40-49.9 (morbid obesity) (HCC) 02/06/2022   Chronic right-sided low back pain 11/02/2021   Bilateral trigger thumb 06/18/2021   Chronic paronychia of finger 01/25/2021   S/P left unicompartmental knee replacement, lateral 06/29/2020   At risk for obstructive sleep apnea 06/11/2019   Cough 06/11/2019   Hyperglycemia 05/26/2019   Acute hypoxemic respiratory failure (HCC) 05/20/2019   Fibromyalgia 03/09/2019   Neck pain, chronic 03/09/2019   Acute shoulder bursitis, right 02/03/2019   Bile salt-induced diarrhea 09/25/2018   Arthritis of left acromioclavicular joint 09/23/2018   Precordial chest pain 08/11/2018   Morbid obesity (HCC) 10/31/2017   S/P right UKR, lateral 10/30/2017   Incarcerated  epigastric hernia 05/30/2015   Left foot pain 08/09/2014   Chronic diarrhea 07/27/2014   Gout 08/10/2012   Acid reflux 01/22/2012   Down syndrome 11/11/2011   Hernia of abdominal wall 10/21/2011   Psoriasis 10/21/2011   Uncontrolled type 2 diabetes mellitus with hyperglycemia, with long-term current use of insulin (HCC) 04/12/2011   Hypothyroid 04/12/2011   Gait abnormality 06/22/2010   Tibial tendinitis, posterior 06/22/2010    ONSET DATE: 12/17/22 referral date  REFERRING DIAG:  Diagnosis  L03.012 (ICD-10-CM) - Cellulitis of left finger  L02.511 (ICD-10-CM) - Cutaneous abscess of right hand  other infective tenosynovitis unspecified site M65.10  THERAPY DIAG:  Stiffness of right hand, not elsewhere classified  Stiffness of left hand, not elsewhere classified  Muscle weakness (generalized)  Other lack of coordination  Other disturbances of skin sensation  Rationale for Evaluation and Treatment: Rehabilitation  SUBJECTIVE:   SUBJECTIVE STATEMENT: Pt reports she wants to get hands stronger Pt accompanied by: self  PERTINENT HISTORY: hx of R long finger amputation through distal phalanx 04/11/22, healing wound at the edge of nail left long finger which is now closed PMH: Down's syndrome, DM  PRECAUTIONS: None, cleared for R hand use to tolerance   WEIGHT BEARING RESTRICTIONS: No  PAIN:  Are you having pain? No  FALLS: Has patient fallen in last 6 months?  no reports of falls  LIVING ENVIRONMENT: Lives with: lives with their family and lives alone Lives in: House/apartment Stairs: No Has following equipment at home: Single point cane  PLOF: Independent with basic ADLs  PATIENT GOALS: improve strength in her hands   NEXT MD VISIT: prn  OBJECTIVE:  Note: Objective measures were completed at Evaluation unless otherwise noted.  HAND DOMINANCE: Right  ADLs: Overall ADLs: mod I with all basic ADLs. Transfers/ambulation related to ADLs:  Tub Shower  transfers: mod I,  with shower seat ,bath tub and shower, pt steps over into to tub Equipment: Shower seat with back    UPPER EXTREMITY ROM:       Active ROM Right eval Left eval  Thumb MCP (0-60)    Thumb IP (0-80)    Thumb Radial abd/add (0-55)     Thumb Palmar abd/add (0-45)     Thumb Opposition to Small Finger     Index MCP (0-90) 75    Index PIP (0-100) 85    Index DIP (0-70)      Long MCP (0-90) 70   80  Long PIP (0-100) 55  65   Long DIP (0-70)    45  Ring MCP (0-90) 80     Ring PIP (0-100) 80     Ring DIP (0-70)      Little MCP (0-90) 65     Little PIP (0-100) 80      (Blank  rows = not tested)  HAND FUNCTION: Grip strength: Right: 35 lbs; Left: 37 lbs  COORDINATION: 9 Hole Peg test: Right: 1 min 25 sec; Left: 1 min 40  sec  SENSATION: Light touch: Impaired   EDEMA: mild in R middle finger, DIP joint has been amputated and is healed with skin flap over tip. Pt with edema at left long finger around nail.  COGNITION: Overall cognitive status: History of cognitive impairments - at baseline   OBSERVATIONS: Pleasant female who is cooperative to work in therapy   TODAY'S TREATMENT:                                                                                                                              DATE: 12/24/22- eval completed    PATIENT EDUCATION: Education details: initial HEP, role of OT and potential goals Person educated: Patient Education method: Explanation, Demonstration, Verbal cues, and Handouts Education comprehension: verbalized understanding  HOME EXERCISE PROGRAM: 12/24/22- inital A/ROM HEP  GOALS: Goals reviewed with patient? Yes  SHORT TERM GOALS: Target date: 01/22/23  I with inital HEP  Goal status: INITIAL  2.  Pt will increase R middle finger MP flexion to 75* for increased funcitonal use. Baseline: 70 Goal status: INITIAL  3.  Pt will increase R middle finger PIP flexion to 65* for increased functional  use. Baseline: 55 Goal status: INITIAL  4.  Pt will increase L middle finger PIP flexion to 75*for increased functional use. Baseline: 65 Goal status: INITIAL  5.  I with sensory precautions and desensitization techniques.   Goal status: INITIAL  LONG TERM GOALS: Target date: 03/04/23  I with updated HEP.   Goal status: INITIAL  2.  Pt will increased bilateral grip strength by 5 lbs for increased UE functional use. Baseline: RUE 35, LUE 37. Goal status: INITIAL  3.  Pt will improve RUE fine motor coordination as evidenced by decreasing 9 hole peg test score by 5 secs,  Goal status: INITIAL  4.  Pt will improve LUE fine motor coordination as evidenced by decreasing 9 hole peg test score by 5 secs Goal status: INITIAL  5.  Pt will report increased ease with using her RUE to write and hold her cane.  Goal status: INITIAL    ASSESSMENT:  CLINICAL IMPRESSION: Patient is a 42 y.o. female  who was seen today for occupational therapy evaluation for: L03.012 (ICD-10-CM) - Cellulitis of left finger  L02.511 (ICD-10-CM) - Cutaneous abscess of right hand  other infective tenosynovitis unspecified site M65.10  Pt. with hx of R long finger amputation through distal phalanx 04/11/22 and healing wound at the edge of nail left long finger which is now closed. Pt presents with the following deficits: decreased strength, decreased coordination, decreased strength, decreased UE functional use, sensory deficits which impedes performance of ADLs/ IADLs. Pt can benefit from skilled occupational therpy to address these deficits in order to maximize pt's safety and I with daily  activities.  PERFORMANCE DEFICITS: in functional skills including ADLs, IADLs, coordination, dexterity, sensation, edema, ROM, strength, pain, flexibility, Fine motor control, Gross motor control, mobility, balance, endurance, decreased knowledge of precautions, decreased knowledge of use of DME, wound, skin integrity, and UE  functional use, cognitive skills including problem solving, and psychosocial skills including coping strategies, environmental adaptation, habits, interpersonal interactions, and routines and behaviors.   IMPAIRMENTS: are limiting patient from ADLs, IADLs, play, leisure, and social participation.   COMORBIDITIES: may have co-morbidities  that affects occupational performance. Patient will benefit from skilled OT to address above impairments and improve overall function.  MODIFICATION OR ASSISTANCE TO COMPLETE EVALUATION: No modification of tasks or assist necessary to complete an evaluation.  OT OCCUPATIONAL PROFILE AND HISTORY: Detailed assessment: Review of records and additional review of physical, cognitive, psychosocial history related to current functional performance.  CLINICAL DECISION MAKING: LOW - limited treatment options, no task modification necessary  REHAB POTENTIAL: Good  EVALUATION COMPLEXITY: Low      PLAN:  OT FREQUENCY: 1x/week  OT DURATION: 10 weeks  PLANNED INTERVENTIONS: 97168 OT Re-evaluation, 97535 self care/ADL training, 40981 therapeutic exercise, 97530 therapeutic activity, 97112 neuromuscular re-education, 97140 manual therapy, 97035 ultrasound, 97018 paraffin, 19147 cryotherapy, 97034 contrast bath, 97014 electrical stimulation unattended, 97760 Orthotics management and training, 82956 Splinting (initial encounter), [id number] Subsequent splinting/medication, scar mobilization, passive range of motion, energy conservation, coping strategies training, patient/family education, and DME and/or AE instructions  RECOMMENDED OTHER SERVICES: n/a  CONSULTED AND AGREED WITH PLAN OF CARE: Patient  PLAN FOR NEXT SESSION: add to HEP, desensitization   Bria Portales, OT 12/24/2022, 2:05 PM

## 2022-12-25 DIAGNOSIS — E1165 Type 2 diabetes mellitus with hyperglycemia: Secondary | ICD-10-CM | POA: Diagnosis not present

## 2022-12-27 ENCOUNTER — Telehealth: Payer: Self-pay | Admitting: Family Medicine

## 2022-12-27 NOTE — Telephone Encounter (Signed)
Copied from CRM [id number]. Topic: Medicare AWV >> Dec 27, 2022 10:43 AM Payton Doughty wrote: Reason for CRM: Called LVM 12/27/2022 to schedule Annual Wellness Visit  Verlee Rossetti; Care Guide Ambulatory Clinical Support [place] [facility] Direct Dial: [phone]

## 2022-12-30 ENCOUNTER — Ambulatory Visit: Payer: Medicare HMO | Admitting: Occupational Therapy

## 2023-01-06 ENCOUNTER — Other Ambulatory Visit: Payer: Self-pay | Admitting: Family Medicine

## 2023-01-06 DIAGNOSIS — R0609 Other forms of dyspnea: Secondary | ICD-10-CM

## 2023-01-06 MED ORDER — PROAIR RESPICLICK 108 (90 BASE) MCG/ACT IN AEPB: INHALATION_SPRAY | RESPIRATORY_TRACT | 12 refills | Status: AC

## 2023-01-06 MED ORDER — ALBUTEROL SULFATE (2.5 MG/3ML) 0.083% IN NEBU: 2.5000 mg | INHALATION_SOLUTION | Freq: Four times a day (QID) | RESPIRATORY_TRACT | 1 refills | Status: DC | PRN

## 2023-01-06 NOTE — Progress Notes (Signed)
Called and spoke with her parents.  Wanda Oneill is having more difficulty with shortness of breath, she gets very short winded even walking short distances on flat ground.  She has been seen by pulmonology without much change.  They are interested in seeking a second opinion at [facility] where she is also seeing a sleep medicine specialist.  I will place an urgent referral for a second opinion. They also note she has a better time with dry powder inhaler such as the Arnuity Ellipta she is using.  Using a more traditional liquid albuterol is hard for her to coordinate and she often does not get an efficacious dose.  We will try  the ProAir Respiclick which may be easier for her to use.  Failing this I also gave her a prescription for albuterol neb that she can use  They will call me if I can do anything to help

## 2023-01-07 ENCOUNTER — Ambulatory Visit: Payer: Medicare HMO | Attending: Orthopedic Surgery | Admitting: Occupational Therapy

## 2023-01-07 DIAGNOSIS — R278 Other lack of coordination: Secondary | ICD-10-CM | POA: Insufficient documentation

## 2023-01-07 DIAGNOSIS — E039 Hypothyroidism, unspecified: Secondary | ICD-10-CM | POA: Diagnosis not present

## 2023-01-07 DIAGNOSIS — M25642 Stiffness of left hand, not elsewhere classified: Secondary | ICD-10-CM | POA: Insufficient documentation

## 2023-01-07 DIAGNOSIS — E1165 Type 2 diabetes mellitus with hyperglycemia: Secondary | ICD-10-CM | POA: Diagnosis not present

## 2023-01-07 DIAGNOSIS — R208 Other disturbances of skin sensation: Secondary | ICD-10-CM | POA: Insufficient documentation

## 2023-01-07 DIAGNOSIS — M6281 Muscle weakness (generalized): Secondary | ICD-10-CM | POA: Insufficient documentation

## 2023-01-07 DIAGNOSIS — E781 Pure hyperglyceridemia: Secondary | ICD-10-CM | POA: Diagnosis not present

## 2023-01-07 DIAGNOSIS — M25641 Stiffness of right hand, not elsewhere classified: Secondary | ICD-10-CM | POA: Diagnosis not present

## 2023-01-07 NOTE — Therapy (Signed)
OUTPATIENT OCCUPATIONAL THERAPY ORTHO EVALUATION  Patient Name: Wanda Oneill MRN: [id number] DOB:[birth year], 42 y.o., female Today's Date: 01/07/2023  PCP: Dr. Patsy Lager REFERRING PROVIDER: Dr. Merlyn Lot  END OF SESSION:  OT End of Session - 01/07/23 1627     Visit Number 2    Number of Visits 10    Date for OT Re-Evaluation 03/04/23    Authorization Type [employer], MCD    OT Start Time 1533    OT Stop Time 1550    OT Time Calculation (min) 17 min              Past Medical History:  Diagnosis Date   Ambulates with cane    4 prong   Diabetes mellitus    type II   Down syndrome    Down's syndrome    Family history of adverse reaction to anesthesia    mom has had n/v   Gastritis    Headache    Hepatic steatosis    Hidradenitis    Hypothyroidism    Irritable bowel syndrome    Osteoarthritis    Periumbilical hernia    Pneumonia 03/2020   frequent    Restless legs    Sleep apnea    Tendonitis    chronic in left foot   Thyroid disease    hypothyroidism   Urinary incontinence 02/2022   some   UTI (urinary tract infection)    just dx 04/2022 - rx amoxicillin   Past Surgical History:  Procedure Laterality Date   AMPUTATION Right 04/11/2022   Procedure: RIGHT LONG FINGER AMPUTATION;  Surgeon: Betha Loa, MD;  Location: [facility] OR;  Service: Orthopedics;  Laterality: Right;   AXILLARY HIDRADENITIS EXCISION Bilateral    CHOLECYSTECTOMY     HERNIA REPAIR     multiple   INCISION AND DRAINAGE ABSCESS Right 02/06/2022   Procedure: INCISION AND DRAINAGE RIGHT HAND;  Surgeon: Betha Loa, MD;  Location: [facility] OR;  Service: Orthopedics;  Laterality: Right;   INCISION AND DRAINAGE WOUND WITH NAILBED REPAIR Left 02/06/2022   Procedure: INCISION AND DRAINAGE LEFT INDEX FINGER;  Surgeon: Betha Loa, MD;  Location: [facility] OR;  Service: Orthopedics;  Laterality: Left;   INCISIONAL HERNIA REPAIR N/A 05/30/2015   Procedure: LAPAROSCOPIC INCISIONAL HERNIA;  Surgeon: Emelia Loron, MD;   Location: [facility] OR;  Service: General;  Laterality: N/A;   INSERTION OF MESH N/A 05/30/2015   Procedure: INSERTION OF MESH;  Surgeon: Emelia Loron, MD;  Location: [facility] OR;  Service: General;  Laterality: N/A;   KNEE ARTHROSCOPY     right knee   LAPAROSCOPY N/A 05/30/2015   Procedure: LAPAROSCOPY DIAGNOSTIC;  Surgeon: Emelia Loron, MD;  Location: [facility] OR;  Service: General;  Laterality: N/A;   PARTIAL KNEE ARTHROPLASTY Right 10/30/2017   Procedure: UNICOMPARTMENTAL RIGHT KNEE LATERAL;  Surgeon: Durene Romans, MD;  Location: [facility];  Service: Orthopedics;  Laterality: Right;  90 mins   PARTIAL KNEE ARTHROPLASTY Left 06/29/2020   Procedure: UNICOMPARTMENTAL KNEE LATERALLY;  Surgeon: Durene Romans, MD;  Location: [facility];  Service: Orthopedics;  Laterality: Left;  70 MINS   TONSILLECTOMY     adenoids   Patient Active Problem List   Diagnosis Date Noted   OSA on CPAP 12/12/2022   DOE (dyspnea on exertion) 08/21/2022   Furuncles 02/08/2022   Infection of flexor tendon sheath 02/06/2022   Finger osteomyelitis, right (HCC) 02/06/2022   Leukocytosis 02/06/2022   History of COVID-19 02/06/2022   IBS (irritable bowel syndrome) 02/06/2022   Hypertriglyceridemia  02/06/2022   Obesity, Class III, BMI 40-49.9 (morbid obesity) (HCC) 02/06/2022   Chronic right-sided low back pain 11/02/2021   Bilateral trigger thumb 06/18/2021   Chronic paronychia of finger 01/25/2021   S/P left unicompartmental knee replacement, lateral 06/29/2020   At risk for obstructive sleep apnea 06/11/2019   Cough 06/11/2019   Hyperglycemia 05/26/2019   Acute hypoxemic respiratory failure (HCC) 05/20/2019   Fibromyalgia 03/09/2019   Neck pain, chronic 03/09/2019   Acute shoulder bursitis, right 02/03/2019   Bile salt-induced diarrhea 09/25/2018   Arthritis of left acromioclavicular joint 09/23/2018   Precordial chest pain 08/11/2018   Morbid obesity (HCC) 10/31/2017   S/P right UKR, lateral 10/30/2017   Incarcerated  epigastric hernia 05/30/2015   Left foot pain 08/09/2014   Chronic diarrhea 07/27/2014   Gout 08/10/2012   Acid reflux 01/22/2012   Down syndrome 11/11/2011   Hernia of abdominal wall 10/21/2011   Psoriasis 10/21/2011   Uncontrolled type 2 diabetes mellitus with hyperglycemia, with long-term current use of insulin (HCC) 04/12/2011   Hypothyroid 04/12/2011   Gait abnormality 06/22/2010   Tibial tendinitis, posterior 06/22/2010    ONSET DATE: 12/17/22 referral date  REFERRING DIAG:  Diagnosis  L03.012 (ICD-10-CM) - Cellulitis of left finger  L02.511 (ICD-10-CM) - Cutaneous abscess of right hand  other infective tenosynovitis unspecified site M65.10  THERAPY DIAG:  Stiffness of right hand, not elsewhere classified  Stiffness of left hand, not elsewhere classified  Muscle weakness (generalized)  Other disturbances of skin sensation  Other lack of coordination  Rationale for Evaluation and Treatment: Rehabilitation  SUBJECTIVE:   SUBJECTIVE STATEMENT: Pt reports exercising some at home Pt accompanied by: self  PERTINENT HISTORY: hx of R long finger amputation through distal phalanx 04/11/22, healing wound at the edge of nail left long finger which is now closed PMH: Down's syndrome, DM  PRECAUTIONS: None, cleared for R hand use to tolerance   WEIGHT BEARING RESTRICTIONS: No  PAIN:  Are you having pain? No  FALLS: Has patient fallen in last 6 months?  no reports of falls  LIVING ENVIRONMENT: Lives with: lives with their family and lives alone Lives in: House/apartment Stairs: No Has following equipment at home: Single point cane  PLOF: Independent with basic ADLs  PATIENT GOALS: improve strength in her hands   NEXT MD VISIT: prn  OBJECTIVE:  Note: Objective measures were completed at Evaluation unless otherwise noted.  HAND DOMINANCE: Right  ADLs: Overall ADLs: mod I with all basic ADLs. Transfers/ambulation related to ADLs:  Tub Shower transfers: mod  I,  with shower seat ,bath tub and shower, pt steps over into to tub Equipment: Shower seat with back    UPPER EXTREMITY ROM:       Active ROM Right eval Left eval  Thumb MCP (0-60)    Thumb IP (0-80)    Thumb Radial abd/add (0-55)     Thumb Palmar abd/add (0-45)     Thumb Opposition to Small Finger     Index MCP (0-90) 75    Index PIP (0-100) 85    Index DIP (0-70)      Long MCP (0-90) 70   80  Long PIP (0-100) 55  65   Long DIP (0-70)    45  Ring MCP (0-90) 80     Ring PIP (0-100) 80     Ring DIP (0-70)      Little MCP (0-90) 65     Little PIP (0-100) 80      (  Blank rows = not tested)  HAND FUNCTION: Grip strength: Right: 35 lbs; Left: 37 lbs  COORDINATION: 9 Hole Peg test: Right: 1 min 25 sec; Left: 1 min 40  sec  SENSATION: Light touch: Impaired   EDEMA: mild in R middle finger, DIP joint has been amputated and is healed with skin flap over tip. Pt with edema at left long finger around nail.  COGNITION: Overall cognitive status: History of cognitive impairments - at baseline   OBSERVATIONS: Pleasant female who is cooperative to work in therapy   TODAY'S TREATMENT:                                                                                                                              DATE: 01/07/23- MP flexion, for RUE, composite flexion bilateral UE's and PIP blocking exercises for middle finger of right and left hands 10 reps each, min v.c and demonstration. initiated education about desensitization. Session ended early due to pt had another medical appointment.  12/24/22- eval completed    PATIENT EDUCATION: Education details:reviewed exercises issued last visit, began education in desensitization. Person educated: Patient Education method: Explanation, Demonstration, Verbal cues,  Education comprehension: verbalized understanding, returned demonstration  HOME EXERCISE PROGRAM: 12/24/22- inital A/ROM HEP  GOALS: Goals reviewed with patient?  Yes  SHORT TERM GOALS: Target date: 01/22/23  I with inital HEP  Goal status: INITIAL  2.  Pt will increase R middle finger MP flexion to 75* for increased funcitonal use. Baseline: 70 Goal status: INITIAL  3.  Pt will increase R middle finger PIP flexion to 65* for increased functional use. Baseline: 55 Goal status: INITIAL  4.  Pt will increase L middle finger PIP flexion to 75*for increased functional use. Baseline: 65 Goal status: INITIAL  5.  I with sensory precautions and desensitization techniques.   Goal status: INITIAL  LONG TERM GOALS: Target date: 03/04/23  I with updated HEP.   Goal status: INITIAL  2.  Pt will increased bilateral grip strength by 5 lbs for increased UE functional use. Baseline: RUE 35, LUE 37. Goal status: INITIAL  3.  Pt will improve RUE fine motor coordination as evidenced by decreasing 9 hole peg test score by 5 secs,  Goal status: INITIAL  4.  Pt will improve LUE fine motor coordination as evidenced by decreasing 9 hole peg test score by 5 secs Goal status: INITIAL  5.  Pt will report increased ease with using her RUE to write and hold her cane.  Goal status: INITIAL    ASSESSMENT:  CLINICAL IMPRESSION: Pt is progressing towards goals with improving A/ROM. PERFORMANCE DEFICITS: in functional skills including ADLs, IADLs, coordination, dexterity, sensation, edema, ROM, strength, pain, flexibility, Fine motor control, Gross motor control, mobility, balance, endurance, decreased knowledge of precautions, decreased knowledge of use of DME, wound, skin integrity, and UE functional use, cognitive skills including problem solving, and psychosocial skills including coping strategies, environmental adaptation, habits, interpersonal interactions, and  routines and behaviors.   IMPAIRMENTS: are limiting patient from ADLs, IADLs, play, leisure, and social participation.   COMORBIDITIES: may have co-morbidities  that affects occupational  performance. Patient will benefit from skilled OT to address above impairments and improve overall function.  MODIFICATION OR ASSISTANCE TO COMPLETE EVALUATION: No modification of tasks or assist necessary to complete an evaluation.  OT OCCUPATIONAL PROFILE AND HISTORY: Detailed assessment: Review of records and additional review of physical, cognitive, psychosocial history related to current functional performance.  CLINICAL DECISION MAKING: LOW - limited treatment options, no task modification necessary  REHAB POTENTIAL: Good  EVALUATION COMPLEXITY: Low      PLAN:  OT FREQUENCY: 1x/week  OT DURATION: 10 weeks  PLANNED INTERVENTIONS: 97168 OT Re-evaluation, 97535 self care/ADL training, 16109 therapeutic exercise, 97530 therapeutic activity, 97112 neuromuscular re-education, 97140 manual therapy, 97035 ultrasound, 97018 paraffin, 60454 cryotherapy, 97034 contrast bath, 97014 electrical stimulation unattended, 97760 Orthotics management and training, 09811 Splinting (initial encounter), [id number] Subsequent splinting/medication, scar mobilization, passive range of motion, energy conservation, coping strategies training, patient/family education, and DME and/or AE instructions  RECOMMENDED OTHER SERVICES: n/a  CONSULTED AND AGREED WITH PLAN OF CARE: Patient  PLAN FOR NEXT SESSION:  consider putty, towel scrunches,  continue desensitization   Wanda Oneill, OT 01/07/2023, 4:28 PM

## 2023-01-08 ENCOUNTER — Other Ambulatory Visit: Payer: Self-pay | Admitting: Family Medicine

## 2023-01-08 ENCOUNTER — Telehealth: Payer: Self-pay

## 2023-01-08 MED ORDER — BENZONATATE 100 MG PO CAPS: 100.0000 mg | ORAL_CAPSULE | Freq: Three times a day (TID) | ORAL | 1 refills | Status: DC | PRN

## 2023-01-08 NOTE — Telephone Encounter (Signed)
PA initiated via Covermymeds; KEY: [id number].   PA approved.   PA Case: [id number], Status: Approved, Coverage Starts on: 02/04/2022 12:00:00 AM, Coverage Ends on: 02/04/2024 12:00:00 AM. Questions? Contact [phone]. Authorization Expiration Date: 02/03/2024

## 2023-01-14 ENCOUNTER — Ambulatory Visit: Payer: Medicare HMO | Admitting: Occupational Therapy

## 2023-01-14 ENCOUNTER — Encounter: Payer: Self-pay | Admitting: Occupational Therapy

## 2023-01-14 DIAGNOSIS — M25641 Stiffness of right hand, not elsewhere classified: Secondary | ICD-10-CM | POA: Diagnosis not present

## 2023-01-14 DIAGNOSIS — M25642 Stiffness of left hand, not elsewhere classified: Secondary | ICD-10-CM | POA: Diagnosis not present

## 2023-01-14 DIAGNOSIS — M6281 Muscle weakness (generalized): Secondary | ICD-10-CM

## 2023-01-14 DIAGNOSIS — R208 Other disturbances of skin sensation: Secondary | ICD-10-CM

## 2023-01-14 DIAGNOSIS — R278 Other lack of coordination: Secondary | ICD-10-CM | POA: Diagnosis not present

## 2023-01-14 DIAGNOSIS — E039 Hypothyroidism, unspecified: Secondary | ICD-10-CM | POA: Diagnosis not present

## 2023-01-14 DIAGNOSIS — E781 Pure hyperglyceridemia: Secondary | ICD-10-CM | POA: Diagnosis not present

## 2023-01-14 DIAGNOSIS — E1165 Type 2 diabetes mellitus with hyperglycemia: Secondary | ICD-10-CM | POA: Diagnosis not present

## 2023-01-14 NOTE — Therapy (Signed)
OUTPATIENT OCCUPATIONAL THERAPY ORTHO treatment  Patient Name: Wanda Oneill MRN: [id number] DOB:01-May-1980, 42 y.o., female Today's Date: 01/14/2023  PCP: Dr. Patsy Lager REFERRING PROVIDER: Dr. Merlyn Lot  END OF SESSION:  OT End of Session - 01/14/23 1526     Visit Number 3    Number of Visits 10    Date for OT Re-Evaluation 03/04/23    Authorization Type [employer], M[employer]    Authorization - Visit Number 3    Progress Note Due on Visit 10    OT Start Time 1521    OT Stop Time 1608    OT Time Calculation (min) 47 min    Activity Tolerance Patient tolerated treatment well    Behavior During Therapy WFL for tasks assessed/performed              Past Medical History:  Diagnosis Date   Ambulates with cane    4 prong   Diabetes mellitus    type II   Down syndrome    Down's syndrome    Family history of adverse reaction to anesthesia    mom has had n/v   Gastritis    Headache    Hepatic steatosis    Hidradenitis    Hypothyroidism    Irritable bowel syndrome    Osteoarthritis    Periumbilical hernia    Pneumonia 03/2020   frequent    Restless legs    Sleep apnea    Tendonitis    chronic in left foot   Thyroid disease    hypothyroidism   Urinary incontinence 02/2022   some   UTI (urinary tract infection)    just dx 04/2022 - rx amoxicillin   Past Surgical History:  Procedure Laterality Date   AMPUTATION Right 04/11/2022   Procedure: RIGHT LONG FINGER AMPUTATION;  Surgeon: Betha Loa, MD;  Location: [facility] OR;  Service: Orthopedics;  Laterality: Right;   AXILLARY HIDRADENITIS EXCISION Bilateral    CHOLECYSTECTOMY     HERNIA REPAIR     multiple   INCISION AND DRAINAGE ABSCESS Right 02/06/2022   Procedure: INCISION AND DRAINAGE RIGHT HAND;  Surgeon: Betha Loa, MD;  Location: [facility] OR;  Service: Orthopedics;  Laterality: Right;   INCISION AND DRAINAGE WOUND WITH NAILBED REPAIR Left 02/06/2022   Procedure: INCISION AND DRAINAGE LEFT INDEX FINGER;  Surgeon: Betha Loa,  MD;  Location: [facility] OR;  Service: Orthopedics;  Laterality: Left;   INCISIONAL HERNIA REPAIR N/A 05/30/2015   Procedure: LAPAROSCOPIC INCISIONAL HERNIA;  Surgeon: Emelia Loron, MD;  Location: [facility] OR;  Service: General;  Laterality: N/A;   INSERTION OF MESH N/A 05/30/2015   Procedure: INSERTION OF MESH;  Surgeon: Emelia Loron, MD;  Location: [facility] OR;  Service: General;  Laterality: N/A;   KNEE ARTHROSCOPY     right knee   LAPAROSCOPY N/A 05/30/2015   Procedure: LAPAROSCOPY DIAGNOSTIC;  Surgeon: Emelia Loron, MD;  Location: [facility] OR;  Service: General;  Laterality: N/A;   PARTIAL KNEE ARTHROPLASTY Right 10/30/2017   Procedure: UNICOMPARTMENTAL RIGHT KNEE LATERAL;  Surgeon: Durene Romans, MD;  Location: [facility] ORS;  Service: Orthopedics;  Laterality: Right;  90 mins   PARTIAL KNEE ARTHROPLASTY Left 06/29/2020   Procedure: UNICOMPARTMENTAL KNEE LATERALLY;  Surgeon: Durene Romans, MD;  Location: [facility] OR[facility];  Service: Orthopedics;  Laterality: Left;  70 MINS   TONSILLECTOMY     adenoids   Patient Active Problem List   Diagnosis Date Noted   OSA on CPAP 12/12/2022   DOE (dyspnea on exertion) 08/21/2022   Furuncles  02/08/2022   Infection of flexor tendon sheath 02/06/2022   Finger osteomyelitis, right (HCC) 02/06/2022   Leukocytosis 02/06/2022   History of COVID-19 02/06/2022   IBS (irritable bowel syndrome) 02/06/2022   Hypertriglyceridemia 02/06/2022   Obesity, Class III, BMI 40-49.9 (morbid obesity) (HCC) 02/06/2022   Chronic right-sided low back pain 11/02/2021   Bilateral trigger thumb 06/18/2021   Chronic paronychia of finger 01/25/2021   S/P left unicompartmental knee replacement, lateral 06/29/2020   At risk for obstructive sleep apnea 06/11/2019   Cough 06/11/2019   Hyperglycemia 05/26/2019   Acute hypoxemic respiratory failure (HCC) 05/20/2019   Fibromyalgia 03/09/2019   Neck pain, chronic 03/09/2019   Acute shoulder bursitis, right 02/03/2019   Bile salt-induced diarrhea  09/25/2018   Arthritis of left acromioclavicular joint 09/23/2018   Precordial chest pain 08/11/2018   Morbid obesity (HCC) 10/31/2017   S/P right UKR, lateral 10/30/2017   Incarcerated epigastric hernia 05/30/2015   Left foot pain 08/09/2014   Chronic diarrhea 07/27/2014   Gout 08/10/2012   Acid reflux 01/22/2012   Down syndrome 11/11/2011   Hernia of abdominal wall 10/21/2011   Psoriasis 10/21/2011   Uncontrolled type 2 diabetes mellitus with hyperglycemia, with long-term current use of insulin (HCC) 04/12/2011   Hypothyroid 04/12/2011   Gait abnormality 06/22/2010   Tibial tendinitis, posterior 06/22/2010    ONSET DATE: 12/17/22 referral date  REFERRING DIAG:  Diagnosis  L03.012 (ICD-10-CM) - Cellulitis of left finger  L02.511 (ICD-10-CM) - Cutaneous abscess of right hand  other infective tenosynovitis unspecified site M65.10  THERAPY DIAG:  Stiffness of right hand, not elsewhere classified  Stiffness of left hand, not elsewhere classified  Muscle weakness (generalized)  Other disturbances of skin sensation  Other lack of coordination  Rationale for Evaluation and Treatment: Rehabilitation  SUBJECTIVE:   SUBJECTIVE STATEMENT: Pt reports she just left a doctor's appointment Pt accompanied by: self  PERTINENT HISTORY: hx of R long finger amputation through distal phalanx 04/11/22, healing wound at the edge of nail left long finger which is now closed PMH: Down's syndrome, DM  PRECAUTIONS: None, cleared for R hand use to tolerance   WEIGHT BEARING RESTRICTIONS: No  PAIN:  Are you having pain? No  FALLS: Has patient fallen in last 6 months?  no reports of falls  LIVING ENVIRONMENT: Lives with: lives with their family and lives alone Lives in: House/apartment Stairs: No Has following equipment at home: Single point cane  PLOF: Independent with basic ADLs  PATIENT GOALS: improve strength in her hands   NEXT MD VISIT: prn  OBJECTIVE:  Note: Objective  measures were completed at Evaluation unless otherwise noted.  HAND DOMINANCE: Right  ADLs: Overall ADLs: mod I with all basic ADLs. Transfers/ambulation related to ADLs:  Tub Shower transfers: mod I,  with shower seat ,bath tub and shower, pt steps over into to tub Equipment: Shower seat with back    UPPER EXTREMITY ROM:       Active ROM Right eval Left eval  Thumb MCP (0-60)    Thumb IP (0-80)    Thumb Radial abd/add (0-55)     Thumb Palmar abd/add (0-45)     Thumb Opposition to Small Finger     Index MCP (0-90) 75    Index PIP (0-100) 85    Index DIP (0-70)      Long MCP (0-90) 70   80  Long PIP (0-100) 55  65   Long DIP (0-70)    45  Ring MCP (0-90)  80     Ring PIP (0-100) 80     Ring DIP (0-70)      Little MCP (0-90) 65     Little PIP (0-100) 80      (Blank rows = not tested)  HAND FUNCTION: Grip strength: Right: 35 lbs; Left: 37 lbs  COORDINATION: 9 Hole Peg test: Right: 1 min 25 sec; Left: 1 min 40  sec  SENSATION: Light touch: Impaired   EDEMA: mild in R middle finger, DIP joint has been amputated and is healed with skin flap over tip. Pt with edema at left long finger around nail.  COGNITION: Overall cognitive status: History of cognitive impairments - at baseline   OBSERVATIONS: Pleasant female who is cooperative to work in therapy   TODAY'S TREATMENT:                                                                                                                              DATE:01/14/23- Hotpack applied to R hand 5  mins for stiffness, no adverse reactions. A/ROM MP flexion, composite finger flexion/ extension, PIP blocking for RUE  min v.c and 10 reps Bandaid was applied to middle finger of LUE as pt ha several healing wounds( glove applied over fingertip during coordination exercises. Pt was instructed in a fine motor coordiantion HEP for bilateral UE's, min-mod v.c and demosntration.  01/07/23- MP flexion, for RUE, composite flexion  bilateral UE's and PIP blocking exercises for middle finger of right and left hands 10 reps each, min v.c and demonstration. initiated education about desensitization. Session ended early due to pt had another medical appointment.  12/24/22- eval completed    PATIENT EDUCATION: Education details:coordiantion HEP, basic desensitization. Person educated: Patient Education method: Explanation, Demonstration, Verbal cues,  Education comprehension: verbalized understanding, returned demonstration  HOME EXERCISE PROGRAM: 12/24/22- inital A/ROM HEP  GOALS: Goals reviewed with patient? Yes  SHORT TERM GOALS: Target date: 01/22/23  I with inital HEP  Goal status: met, 01/14/23  2.  Pt will increase R middle finger MP flexion to 75* for increased funcitonal use. Baseline: 70 Goal status:ongoing  3.  Pt will increase R middle finger PIP flexion to 65* for increased functional use. Baseline: 55 Goal status: ongoing  4.  Pt will increase L middle finger PIP flexion to 75*for increased functional use. Baseline: 65 Goal status:  ongoing  5.  I with sensory precautions and desensitization techniques.   Goal status: ongoing  LONG TERM GOALS: Target date: 03/04/23  I with updated HEP.   Goal status: INITIAL  2.  Pt will increased bilateral grip strength by 5 lbs for increased UE functional use. Baseline: RUE 35, LUE 37. Goal status: INITIAL  3.  Pt will improve RUE fine motor coordination as evidenced by decreasing 9 hole peg test score by 5 secs,  Goal status: INITIAL  4.  Pt will improve LUE fine motor coordination as evidenced by decreasing 9 hole peg test score  by 5 secs Goal status: INITIAL  5.  Pt will report increased ease with using her RUE to write and hold her cane.  Goal status: INITIAL    ASSESSMENT:  CLINICAL IMPRESSION: Pt is progressing towards goals with improving A/ROM. She demonstrates understandingof beginning HEP for coordination. PERFORMANCE  DEFICITS: in functional skills including ADLs, IADLs, coordination, dexterity, sensation, edema, ROM, strength, pain, flexibility, Fine motor control, Gross motor control, mobility, balance, endurance, decreased knowledge of precautions, decreased knowledge of use of DME, wound, skin integrity, and UE functional use, cognitive skills including problem solving, and psychosocial skills including coping strategies, environmental adaptation, habits, interpersonal interactions, and routines and behaviors.   IMPAIRMENTS: are limiting patient from ADLs, IADLs, play, leisure, and social participation.   COMORBIDITIES: may have co-morbidities  that affects occupational performance. Patient will benefit from skilled OT to address above impairments and improve overall function.  MODIFICATION OR ASSISTANCE TO COMPLETE EVALUATION: No modification of tasks or assist necessary to complete an evaluation.  OT OCCUPATIONAL PROFILE AND HISTORY: Detailed assessment: Review of records and additional review of physical, cognitive, psychosocial history related to current functional performance.  CLINICAL DECISION MAKING: LOW - limited treatment options, no task modification necessary  REHAB POTENTIAL: Good  EVALUATION COMPLEXITY: Low      PLAN:  OT FREQUENCY: 1x/week  OT DURATION: 10 weeks  PLANNED INTERVENTIONS: 97168 OT Re-evaluation, 97535 self care/ADL training, 08657 therapeutic exercise, 97530 therapeutic activity, 97112 neuromuscular re-education, 97140 manual therapy, 97035 ultrasound, 97018 paraffin, 84696 cryotherapy, 97034 contrast bath, 97014 electrical stimulation unattended, 97760 Orthotics management and training, 29528 Splinting (initial encounter), [id number] Subsequent splinting/medication, scar mobilization, passive range of motion, energy conservation, coping strategies training, patient/family education, and DME and/or AE instructions  RECOMMENDED OTHER SERVICES: n/a  CONSULTED AND AGREED WITH  PLAN OF CARE: Patient  PLAN FOR NEXT SESSION:  consider putty for grip , towel scrunches,     Lamontae Ricardo, OT 01/14/2023, 5:46 PM

## 2023-01-14 NOTE — Patient Instructions (Signed)
  Coordination Activities- Use a band aid on left middle finger with these activities.  Perform the following activities for 10-20 minutes 1 times per day with both hand(s).  Rotate ball in fingertips (clockwise and counter-clockwise). Flip cards 1 at a time  Pick up coins and stack. Pick up coins one at a time until you get 5-10 in your hand, then move coins from palm to fingertips to  place in a container Practice writing and/or typing.  Desensitize your right middle finger by rubbing gently or tapping with your other hand

## 2023-01-15 DIAGNOSIS — Z6841 Body Mass Index (BMI) 40.0 and over, adult: Secondary | ICD-10-CM | POA: Diagnosis not present

## 2023-01-15 DIAGNOSIS — G4734 Idiopathic sleep related nonobstructive alveolar hypoventilation: Secondary | ICD-10-CM | POA: Diagnosis not present

## 2023-01-15 DIAGNOSIS — R0609 Other forms of dyspnea: Secondary | ICD-10-CM | POA: Diagnosis not present

## 2023-01-15 DIAGNOSIS — J4550 Severe persistent asthma, uncomplicated: Secondary | ICD-10-CM | POA: Diagnosis not present

## 2023-01-16 ENCOUNTER — Other Ambulatory Visit: Payer: Self-pay | Admitting: Family Medicine

## 2023-01-21 ENCOUNTER — Ambulatory Visit: Payer: Medicare HMO | Admitting: Occupational Therapy

## 2023-01-23 ENCOUNTER — Other Ambulatory Visit: Payer: Self-pay | Admitting: Family Medicine

## 2023-01-23 DIAGNOSIS — E781 Pure hyperglyceridemia: Secondary | ICD-10-CM | POA: Diagnosis not present

## 2023-01-23 DIAGNOSIS — E1165 Type 2 diabetes mellitus with hyperglycemia: Secondary | ICD-10-CM | POA: Diagnosis not present

## 2023-01-23 DIAGNOSIS — Q909 Down syndrome, unspecified: Secondary | ICD-10-CM

## 2023-01-27 ENCOUNTER — Encounter: Payer: Self-pay | Admitting: Occupational Therapy

## 2023-01-27 ENCOUNTER — Ambulatory Visit: Payer: Medicare HMO | Admitting: Occupational Therapy

## 2023-01-27 DIAGNOSIS — R278 Other lack of coordination: Secondary | ICD-10-CM | POA: Diagnosis not present

## 2023-01-27 DIAGNOSIS — M25642 Stiffness of left hand, not elsewhere classified: Secondary | ICD-10-CM

## 2023-01-27 DIAGNOSIS — M6281 Muscle weakness (generalized): Secondary | ICD-10-CM | POA: Diagnosis not present

## 2023-01-27 DIAGNOSIS — R208 Other disturbances of skin sensation: Secondary | ICD-10-CM

## 2023-01-27 DIAGNOSIS — M25641 Stiffness of right hand, not elsewhere classified: Secondary | ICD-10-CM

## 2023-01-27 NOTE — Patient Instructions (Signed)
Putty-in-Your-Hand    Slowly squeeze putty or a soft rubber ball while breathing normally. Repeat with other hand. Repeat ___10_ times. Do ___1_ sessions per day.  http://gt2.exer.us/885   Finger / Thumb Activities: Extension    Roll putty into rope shape using all fingers held straight. Hitchhike with thumb up and out.  Copyright  [employer]. All rights reserved.    Copyright  V[employer]. All rights reserved.  Pinch: Palmar    Pinch putty with right thumb and each fingertip in turn. Repeat __10__ times. Do __1-2__ sessions per day. Activity: Peel fruit such as lemons or oranges.* Peel stickers off surfaces.  Copyright  VHI. All rights reserved.

## 2023-01-27 NOTE — Therapy (Signed)
OUTPATIENT OCCUPATIONAL THERAPY ORTHO treatment  Patient Name: Wanda Oneill MRN: [id number] DOB:1981/01/25, 42 y.o., female Today's Date: 01/27/2023  PCP: Dr. Patsy Lager REFERRING PROVIDER: Dr. Merlyn Lot  END OF SESSION:  OT End of Session - 01/27/23 1409     Visit Number 4    Number of Visits 10    Date for OT Re-Evaluation 03/04/23    Authorization Type [employer], M[employer]    Authorization - Visit Number 4    Progress Note Due on Visit 10    OT Start Time 1403    OT Stop Time 1450    OT Time Calculation (min) 47 min    Activity Tolerance Patient tolerated treatment well    Behavior During Therapy WFL for tasks assessed/performed               Past Medical History:  Diagnosis Date   Ambulates with cane    4 prong   Diabetes mellitus    type II   Down syndrome    Down's syndrome    Family history of adverse reaction to anesthesia    mom has had n/v   Gastritis    Headache    Hepatic steatosis    Hidradenitis    Hypothyroidism    Irritable bowel syndrome    Osteoarthritis    Periumbilical hernia    Pneumonia 03/2020   frequent    Restless legs    Sleep apnea    Tendonitis    chronic in left foot   Thyroid disease    hypothyroidism   Urinary incontinence 02/2022   some   UTI (urinary tract infection)    just dx 04/2022 - rx amoxicillin   Past Surgical History:  Procedure Laterality Date   AMPUTATION Right 04/11/2022   Procedure: RIGHT LONG FINGER AMPUTATION;  Surgeon: Betha Loa, MD;  Location: [facility] OR;  Service: Orthopedics;  Laterality: Right;   AXILLARY HIDRADENITIS EXCISION Bilateral    CHOLECYSTECTOMY     HERNIA REPAIR     multiple   INCISION AND DRAINAGE ABSCESS Right 02/06/2022   Procedure: INCISION AND DRAINAGE RIGHT HAND;  Surgeon: Betha Loa, MD;  Location: [facility] OR;  Service: Orthopedics;  Laterality: Right;   INCISION AND DRAINAGE WOUND WITH NAILBED REPAIR Left 02/06/2022   Procedure: INCISION AND DRAINAGE LEFT INDEX FINGER;  Surgeon: Betha Loa,  MD;  Location: [facility] OR;  Service: Orthopedics;  Laterality: Left;   INCISIONAL HERNIA REPAIR N/A 05/30/2015   Procedure: LAPAROSCOPIC INCISIONAL HERNIA;  Surgeon: Emelia Loron, MD;  Location: [facility] OR;  Service: General;  Laterality: N/A;   INSERTION OF MESH N/A 05/30/2015   Procedure: INSERTION OF MESH;  Surgeon: Emelia Loron, MD;  Location: [facility] OR;  Service: General;  Laterality: N/A;   KNEE ARTHROSCOPY     right knee   LAPAROSCOPY N/A 05/30/2015   Procedure: LAPAROSCOPY DIAGNOSTIC;  Surgeon: Emelia Loron, MD;  Location: [facility] OR;  Service: General;  Laterality: N/A;   PARTIAL KNEE ARTHROPLASTY Right 10/30/2017   Procedure: UNICOMPARTMENTAL RIGHT KNEE LATERAL;  Surgeon: Durene Romans, MD;  Location: [facility] ORS;  Service: Orthopedics;  Laterality: Right;  90 mins   PARTIAL KNEE ARTHROPLASTY Left 06/29/2020   Procedure: UNICOMPARTMENTAL KNEE LATERALLY;  Surgeon: Durene Romans, MD;  Location: [facility];  Service: Orthopedics;  Laterality: Left;  70 MINS   TONSILLECTOMY     adenoids   Patient Active Problem List   Diagnosis Date Noted   OSA on CPAP 12/12/2022   DOE (dyspnea on exertion) 08/21/2022  Furuncles 02/08/2022   Infection of flexor tendon sheath 02/06/2022   Finger osteomyelitis, right (HCC) 02/06/2022   Leukocytosis 02/06/2022   History of COVID-19 02/06/2022   IBS (irritable bowel syndrome) 02/06/2022   Hypertriglyceridemia 02/06/2022   Obesity, Class III, BMI 40-49.9 (morbid obesity) (HCC) 02/06/2022   Chronic right-sided low back pain 11/02/2021   Bilateral trigger thumb 06/18/2021   Chronic paronychia of finger 01/25/2021   S/P left unicompartmental knee replacement, lateral 06/29/2020   At risk for obstructive sleep apnea 06/11/2019   Cough 06/11/2019   Hyperglycemia 05/26/2019   Acute hypoxemic respiratory failure (HCC) 05/20/2019   Fibromyalgia 03/09/2019   Neck pain, chronic 03/09/2019   Acute shoulder bursitis, right 02/03/2019   Bile salt-induced diarrhea  09/25/2018   Arthritis of left acromioclavicular joint 09/23/2018   Precordial chest pain 08/11/2018   Morbid obesity (HCC) 10/31/2017   S/P right UKR, lateral 10/30/2017   Incarcerated epigastric hernia 05/30/2015   Left foot pain 08/09/2014   Chronic diarrhea 07/27/2014   Gout 08/10/2012   Acid reflux 01/22/2012   Down syndrome 11/11/2011   Hernia of abdominal wall 10/21/2011   Psoriasis 10/21/2011   Uncontrolled type 2 diabetes mellitus with hyperglycemia, with long-term current use of insulin (HCC) 04/12/2011   Hypothyroid 04/12/2011   Gait abnormality 06/22/2010   Tibial tendinitis, posterior 06/22/2010    ONSET DATE: 12/17/22 referral date  REFERRING DIAG:  Diagnosis  L03.012 (ICD-10-CM) - Cellulitis of left finger  L02.511 (ICD-10-CM) - Cutaneous abscess of right hand  other infective tenosynovitis unspecified site M65.10  THERAPY DIAG:  Stiffness of right hand, not elsewhere classified  Stiffness of left hand, not elsewhere classified  Muscle weakness (generalized)  Other disturbances of skin sensation  Other lack of coordination  Rationale for Evaluation and Treatment: Rehabilitation  SUBJECTIVE:   SUBJECTIVE STATEMENT: Pt reports she is going to spend the day at her brother's house tomorrow. Pt accompanied by: self  PERTINENT HISTORY: hx of R long finger amputation through distal phalanx 04/11/22, healing wound at the edge of nail left long finger which is now closed PMH: Down's syndrome, DM  PRECAUTIONS: None, cleared for R hand use to tolerance   WEIGHT BEARING RESTRICTIONS: No  PAIN:  Are you having pain? Yes: NPRS scale: 2/10 Pain location: right hand Pain description: aching Aggravating factors: use Relieving factors: heat    FALLS: Has patient fallen in last 6 months?  no reports of falls  LIVING ENVIRONMENT: Lives with: lives with their family and lives alone Lives in: House/apartment Stairs: No Has following equipment at home: Single  point cane  PLOF: Independent with basic ADLs  PATIENT GOALS: improve strength in her hands   NEXT MD VISIT: prn  OBJECTIVE:  Note: Objective measures were completed at Evaluation unless otherwise noted.  HAND DOMINANCE: Right  ADLs: Overall ADLs: mod I with all basic ADLs. Transfers/ambulation related to ADLs:  Tub Shower transfers: mod I,  with shower seat ,bath tub and shower, pt steps over into to tub Equipment: Shower seat with back    UPPER EXTREMITY ROM:       Active ROM Right eval Left eval  Thumb MCP (0-60)    Thumb IP (0-80)    Thumb Radial abd/add (0-55)     Thumb Palmar abd/add (0-45)     Thumb Opposition to Small Finger     Index MCP (0-90) 75    Index PIP (0-100) 85    Index DIP (0-70)      Long MCP (  0-90) 70   80  Long PIP (0-100) 55  65   Long DIP (0-70)    45  Ring MCP (0-90) 80     Ring PIP (0-100) 80     Ring DIP (0-70)      Little MCP (0-90) 65     Little PIP (0-100) 80      (Blank rows = not tested)  HAND FUNCTION: Grip strength: Right: 35 lbs; Left: 37 lbs  COORDINATION: 9 Hole Peg test: Right: 1 min 25 sec; Left: 1 min 40  sec  SENSATION: Light touch: Impaired   EDEMA: mild in R middle finger, DIP joint has been amputated and is healed with skin flap over tip. Pt with edema at left long finger around nail.  COGNITION: Overall cognitive status: History of cognitive impairments - at baseline   OBSERVATIONS: Pleasant female who is cooperative to work in therapy   TODAY'S TREATMENT:                                                                                                                              DATE:01/27/23 Hotpack applied to R hand for 6  mins for stiffness, no adverse reactions. A/ROM MP flexion, composite finger flexion/ extension, passivecomposite finger flexion bilateral hands and  PIP blocking for right and left middle fingers min v.c and 10 reps Pt was instructed in red putty HEP for RUE, see pt  instructions. Writing using foam grip and min assist for positioning. Pt demonstrates improved legibility of handwririting and foam grip was issued for home.  01/14/23- Hotpack applied to R hand 5  mins for stiffness, no adverse reactions. A/ROM MP flexion, composite finger flexion/ extension, PIP blocking for RUE  min v.c and 10 reps Bandaid was applied to middle finger of LUE as pt ha several healing wounds( glove applied over fingertip during coordination exercises. Pt was instructed in a fine motor coordiantion HEP for bilateral UE's, min-mod v.c and demosntration.  01/07/23- MP flexion, for RUE, composite flexion bilateral UE's and PIP blocking exercises for middle finger of right and left hands 10 reps each, min v.c and demonstration. initiated education about desensitization. Session ended early due to pt had another medical appointment.  12/24/22- eval completed    PATIENT EDUCATION: Education details:red putty HEP Person educated: Patient Education method: Explanation, Demonstration, Verbal cues, handout Education comprehension: verbalized understanding, returned demonstration, v.c  HOME EXERCISE PROGRAM: 12/24/22- inital A/ROM HEP desensitization, sensory prec red putty HEP- 01/27/23  GOALS: Goals reviewed with patient? Yes  SHORT TERM GOALS: Target date: 01/22/23  I with inital HEP  Goal status: met, 01/14/23  2.  Pt will increase R middle finger MP flexion to 75* for increased funcitonal use. Baseline: 70 Goal status:met, 80* 12/23  3.  Pt will increase R middle finger PIP flexion to 65* for increased functional use. Baseline: 55 Goal status:met, 65 12/23  4.  Pt will increase L middle finger PIP flexion to  75*for increased functional use. Baseline: 65 Goal status:  ongoing 65*, 01/27/23  5.  I with sensory precautions and desensitization techniques.   Goal status: ongoing needs reinforcement 01/27/23  LONG TERM GOALS: Target date: 03/04/23  I with updated  HEP.   Goal status: INITIAL  2.  Pt will increased bilateral grip strength by 5 lbs for increased UE functional use. Baseline: RUE 35, LUE 37. Goal status: INITIAL  3.  Pt will improve RUE fine motor coordination as evidenced by decreasing 9 hole peg test score by 5 secs,  Goal status: INITIAL  4.  Pt will improve LUE fine motor coordination as evidenced by decreasing 9 hole peg test score by 5 secs Goal status: INITIAL  5.  Pt will report increased ease with using her RUE to write and hold her cane.  Goal status: INITIAL    ASSESSMENT:  CLINICAL IMPRESSION: Pt is progressing towards goals with improving A/ROM. She demonstrates understanding of red putty HEP for increased grip strength. Pt demonstrates improved handwriting using foam grip.  PERFORMANCE DEFICITS: in functional skills including ADLs, IADLs, coordination, dexterity, sensation, edema, ROM, strength, pain, flexibility, Fine motor control, Gross motor control, mobility, balance, endurance, decreased knowledge of precautions, decreased knowledge of use of DME, wound, skin integrity, and UE functional use, cognitive skills including problem solving, and psychosocial skills including coping strategies, environmental adaptation, habits, interpersonal interactions, and routines and behaviors.   IMPAIRMENTS: are limiting patient from ADLs, IADLs, play, leisure, and social participation.   COMORBIDITIES: may have co-morbidities  that affects occupational performance. Patient will benefit from skilled OT to address above impairments and improve overall function.  MODIFICATION OR ASSISTANCE TO COMPLETE EVALUATION: No modification of tasks or assist necessary to complete an evaluation.  OT OCCUPATIONAL PROFILE AND HISTORY: Detailed assessment: Review of records and additional review of physical, cognitive, psychosocial history related to current functional performance.  CLINICAL DECISION MAKING: LOW - limited treatment options, no  task modification necessary  REHAB POTENTIAL: Good  EVALUATION COMPLEXITY: Low      PLAN:  OT FREQUENCY: 1x/week  OT DURATION: 10 weeks  PLANNED INTERVENTIONS: 97168 OT Re-evaluation, 97535 self care/ADL training, 16109 therapeutic exercise, 97530 therapeutic activity, 97112 neuromuscular re-education, 97140 manual therapy, 97035 ultrasound, 97018 paraffin, 60454 cryotherapy, 97034 contrast bath, 97014 electrical stimulation unattended, 97760 Orthotics management and training, 09811 Splinting (initial encounter), [id number] Subsequent splinting/medication, scar mobilization, passive range of motion, energy conservation, coping strategies training, patient/family education, and DME and/or AE instructions  RECOMMENDED OTHER SERVICES: n/a  CONSULTED AND AGREED WITH PLAN OF CARE: Patient  PLAN FOR NEXT SESSION:  washcloth scrunches, review putty exercises, reinforce desensitization    Dalila Arca, OT 01/27/2023, 3:13 PM

## 2023-02-03 ENCOUNTER — Ambulatory Visit: Payer: Medicare HMO | Admitting: Occupational Therapy

## 2023-02-07 DIAGNOSIS — J849 Interstitial pulmonary disease, unspecified: Secondary | ICD-10-CM | POA: Diagnosis not present

## 2023-02-17 ENCOUNTER — Ambulatory Visit: Payer: Medicare HMO | Attending: Orthopedic Surgery | Admitting: Occupational Therapy

## 2023-02-17 DIAGNOSIS — R208 Other disturbances of skin sensation: Secondary | ICD-10-CM | POA: Diagnosis not present

## 2023-02-17 DIAGNOSIS — M25642 Stiffness of left hand, not elsewhere classified: Secondary | ICD-10-CM | POA: Diagnosis not present

## 2023-02-17 DIAGNOSIS — R278 Other lack of coordination: Secondary | ICD-10-CM | POA: Diagnosis not present

## 2023-02-17 DIAGNOSIS — M6281 Muscle weakness (generalized): Secondary | ICD-10-CM | POA: Diagnosis not present

## 2023-02-17 DIAGNOSIS — M25641 Stiffness of right hand, not elsewhere classified: Secondary | ICD-10-CM | POA: Diagnosis not present

## 2023-02-17 NOTE — Therapy (Signed)
 OUTPATIENT OCCUPATIONAL THERAPY ORTHO treatment  Patient Name: Wanda Oneill MRN: [id number] DOB:[birth year], 43 y.o., female Today's Date: 02/17/2023  PCP: Dr. Watt REFERRING PROVIDER: Dr. Murrell  END OF SESSION:  OT End of Session - 02/17/23 1605     Visit Number 5    Number of Visits 10    Date for OT Re-Evaluation 03/04/23    Authorization Type [employer], M[employer]    Authorization - Visit Number 5    Progress Note Due on Visit 10    OT Start Time 1535    OT Stop Time 1615    OT Time Calculation (min) 40 min    Activity Tolerance Patient tolerated treatment well    Behavior During Therapy WFL for tasks assessed/performed                Past Medical History:  Diagnosis Date   Ambulates with cane    4 prong   Diabetes mellitus    type II   Down syndrome    Down's syndrome    Family history of adverse reaction to anesthesia    mom has had n/v   Gastritis    Headache    Hepatic steatosis    Hidradenitis    Hypothyroidism    Irritable bowel syndrome    Osteoarthritis    Periumbilical hernia    Pneumonia 03/2020   frequent    Restless legs    Sleep apnea    Tendonitis    chronic in left foot   Thyroid  disease    hypothyroidism   Urinary incontinence 02/2022   some   UTI (urinary tract infection)    just dx 04/2022 - rx amoxicillin    Past Surgical History:  Procedure Laterality Date   AMPUTATION Right 04/11/2022   Procedure: RIGHT LONG FINGER AMPUTATION;  Surgeon: Murrell Drivers, MD;  Location: [facility] OR;  Service: Orthopedics;  Laterality: Right;   AXILLARY HIDRADENITIS EXCISION Bilateral    CHOLECYSTECTOMY     HERNIA REPAIR     multiple   INCISION AND DRAINAGE ABSCESS Right 02/06/2022   Procedure: INCISION AND DRAINAGE RIGHT HAND;  Surgeon: Murrell Drivers, MD;  Location: [facility] OR;  Service: Orthopedics;  Laterality: Right;   INCISION AND DRAINAGE WOUND WITH NAILBED REPAIR Left 02/06/2022   Procedure: INCISION AND DRAINAGE LEFT INDEX FINGER;  Surgeon: Murrell Drivers, MD;  Location: [facility] OR;  Service: Orthopedics;  Laterality: Left;   INCISIONAL HERNIA REPAIR N/A 05/30/2015   Procedure: LAPAROSCOPIC INCISIONAL HERNIA;  Surgeon: Donnice Bury, MD;  Location: [facility] OR;  Service: General;  Laterality: N/A;   INSERTION OF MESH N/A 05/30/2015   Procedure: INSERTION OF MESH;  Surgeon: Donnice Bury, MD;  Location: [facility] OR;  Service: General;  Laterality: N/A;   KNEE ARTHROSCOPY     right knee   LAPAROSCOPY N/A 05/30/2015   Procedure: LAPAROSCOPY DIAGNOSTIC;  Surgeon: Donnice Bury, MD;  Location: [facility] OR;  Service: General;  Laterality: N/A;   PARTIAL KNEE ARTHROPLASTY Right 10/30/2017   Procedure: UNICOMPARTMENTAL RIGHT KNEE LATERAL;  Surgeon: Ernie Donnice, MD;  Location: [facility] ORS;  Service: Orthopedics;  Laterality: Right;  90 mins   PARTIAL KNEE ARTHROPLASTY Left 06/29/2020   Procedure: UNICOMPARTMENTAL KNEE LATERALLY;  Surgeon: Ernie Donnice, MD;  Location: [facility] OR[facility];  Service: Orthopedics;  Laterality: Left;  70 MINS   TONSILLECTOMY     adenoids   Patient Active Problem List   Diagnosis Date Noted   OSA on CPAP 12/12/2022   DOE (dyspnea on exertion) 08/21/2022  Furuncles 02/08/2022   Infection of flexor tendon sheath 02/06/2022   Finger osteomyelitis, right (HCC) 02/06/2022   Leukocytosis 02/06/2022   History of COVID-19 02/06/2022   IBS (irritable bowel syndrome) 02/06/2022   Hypertriglyceridemia 02/06/2022   Obesity, Class III, BMI 40-49.9 (morbid obesity) (HCC) 02/06/2022   Chronic right-sided low back pain 11/02/2021   Bilateral trigger thumb 06/18/2021   Chronic paronychia of finger 01/25/2021   S/P left unicompartmental knee replacement, lateral 06/29/2020   At risk for obstructive sleep apnea 06/11/2019   Cough 06/11/2019   Hyperglycemia 05/26/2019   Acute hypoxemic respiratory failure (HCC) 05/20/2019   Fibromyalgia 03/09/2019   Neck pain, chronic 03/09/2019   Acute shoulder bursitis, right 02/03/2019   Bile salt-induced  diarrhea 09/25/2018   Arthritis of left acromioclavicular joint 09/23/2018   Precordial chest pain 08/11/2018   Morbid obesity (HCC) 10/31/2017   S/P right UKR, lateral 10/30/2017   Incarcerated epigastric hernia 05/30/2015   Left foot pain 08/09/2014   Chronic diarrhea 07/27/2014   Gout 08/10/2012   Acid reflux 01/22/2012   Down syndrome 11/11/2011   Hernia of abdominal wall 10/21/2011   Psoriasis 10/21/2011   Uncontrolled type 2 diabetes mellitus with hyperglycemia, with long-term current use of insulin  (HCC) 04/12/2011   Hypothyroid 04/12/2011   Gait abnormality 06/22/2010   Tibial tendinitis, posterior 06/22/2010    ONSET DATE: 12/17/22 referral date  REFERRING DIAG:  Diagnosis  L03.012 (ICD-10-CM) - Cellulitis of left finger  L02.511 (ICD-10-CM) - Cutaneous abscess of right hand  other infective tenosynovitis unspecified site M65.10  THERAPY DIAG:  Stiffness of right hand, not elsewhere classified  Stiffness of left hand, not elsewhere classified  Muscle weakness (generalized)  Other disturbances of skin sensation  Other lack of coordination  Rationale for Evaluation and Treatment: Rehabilitation  SUBJECTIVE:   SUBJECTIVE STATEMENT: Pt reports has been very busy Pt accompanied by: self  PERTINENT HISTORY: hx of R long finger amputation through distal phalanx 04/11/22, healing wound at the edge of nail left long finger which is now closed PMH: Down's syndrome, DM  PRECAUTIONS: None, cleared for R hand use to tolerance   WEIGHT BEARING RESTRICTIONS: No  PAIN:  Are you having pain? Yes: NPRS scale: 4/10 Pain location: right hand Pain description: aching Aggravating factors: use Relieving factors: heat    FALLS: Has patient fallen in last 6 months?  no reports of falls  LIVING ENVIRONMENT: Lives with: lives with their family and lives alone Lives in: House/apartment Stairs: No Has following equipment at home: Single point cane  PLOF: Independent with  basic ADLs  PATIENT GOALS: improve strength in her hands   NEXT MD VISIT: prn  OBJECTIVE:  Note: Objective measures were completed at Evaluation unless otherwise noted.  HAND DOMINANCE: Right  ADLs: Overall ADLs: mod I with all basic ADLs. Transfers/ambulation related to ADLs:  Tub Shower transfers: mod I,  with shower seat ,bath tub and shower, pt steps over into to tub Equipment: Shower seat with back    UPPER EXTREMITY ROM:       Active ROM Right eval Left eval  Thumb MCP (0-60)    Thumb IP (0-80)    Thumb Radial abd/add (0-55)     Thumb Palmar abd/add (0-45)     Thumb Opposition to Small Finger     Index MCP (0-90) 75    Index PIP (0-100) 85    Index DIP (0-70)      Long MCP (0-90) 70   80  Long PIP (  0-100) 55  65   Long DIP (0-70)    45  Ring MCP (0-90) 80     Ring PIP (0-100) 80     Ring DIP (0-70)      Little MCP (0-90) 65     Little PIP (0-100) 80      (Blank rows = not tested)  HAND FUNCTION: Grip strength: Right: 35 lbs; Left: 37 lbs  COORDINATION: 9 Hole Peg test: Right: 1 min 25 sec; Left: 1 min 40  sec  SENSATION: Light touch: Impaired   EDEMA: mild in R middle finger, DIP joint has been amputated and is healed with skin flap over tip. Pt with edema at left long finger around nail.  COGNITION: Overall cognitive status: History of cognitive impairments - at baseline   OBSERVATIONS: Pleasant female who is cooperative to work in therapy   TODAY'S TREATMENT:                                                                                                                              DATE:02/17/23-A/ROM composite flexion, RUE PIP blocking  for right middle finger Therapist reviewed desensitization and sensory precautions. Tracing activity with foam grip, min difficulty/ v.c Handwriting activity with foam grip, min v.c for performance, pt with variable legibility of handwriting. Pt squeezed foam squeeze ball 2 sets of 10 reps with each hand for  increased grip strength, min v.c  Placing large pegs into pegboard with RUE for increased functional use min v.c for functional use of middle finger  01/27/23 Hotpack applied to R hand for 6  mins for stiffness, no adverse reactions. A/ROM MP flexion, composite finger flexion/ extension, passive composite finger flexion bilateral hands and  PIP blocking for right and left middle fingers min v.c and 10 reps Pt was instructed in red putty HEP for RUE, see pt instructions. Writing using foam grip and min assist for positioning. Pt demonstrates improved legibility of handwririting and foam grip was issued for home.  01/14/23- Hotpack applied to R hand 5  mins for stiffness, no adverse reactions. A/ROM MP flexion, composite finger flexion/ extension, PIP blocking for RUE  min v.c and 10 reps Bandaid was applied to middle finger of LUE as pt ha several healing wounds( glove applied over fingertip during coordination exercises. Pt was instructed in a fine motor coordiantion HEP for bilateral UE's, min-mod v.c and demosntration.  01/07/23- MP flexion, for RUE, composite flexion bilateral UE's and PIP blocking exercises for middle finger of right and left hands 10 reps each, min v.c and demonstration. initiated education about desensitization. Session ended early due to pt had another medical appointment.  12/24/22- eval completed    PATIENT EDUCATION: Education details:r desensitization, sensory precuaitons. Person educated: Patient Education method: Explanation,, Verbal cues, handout Education comprehension: verbalized understandingv.c  HOME EXERCISE PROGRAM: 12/24/22- inital A/ROM HEP desensitization, sensory prec red putty HEP- 01/27/23  GOALS: Goals reviewed with patient? Yes  SHORT TERM GOALS: Target  date: 01/22/23  I with inital HEP  Goal status: met, 01/14/23  2.  Pt will increase R middle finger MP flexion to 75* for increased funcitonal use. Baseline: 70 Goal status:met, 80*  12/23  3.  Pt will increase R middle finger PIP flexion to 65* for increased functional use. Baseline: 55 Goal status:met, 65 12/23  4.  Pt will increase L middle finger PIP flexion to 75*for increased functional use. Baseline: 65 Goal status:  ongoing 65*, 01/27/23  5.  I with sensory precautions and desensitization techniques.   Goal status:  met pt verbalizes understanding 02/17/23  LONG TERM GOALS: Target date: 03/04/23  I with updated HEP.   Goal status: ongoing  2.  Pt will increased bilateral grip strength by 5 lbs for increased UE functional use. Baseline: RUE 35, LUE 37. Goal status: ongoing 20 lbs bilaterally  3.  Pt will improve RUE fine motor coordination as evidenced by decreasing 9 hole peg test score by 5 secs,  Goal status: ongoing  4.  Pt will improve LUE fine motor coordination as evidenced by decreasing 9 hole peg test score by 5 secs Goal status:  ongoing  5.  Pt will report increased ease with using her RUE to write and hold her cane.  Goal status: ongoing    ASSESSMENT:  CLINICAL IMPRESSION: Pt is progressing slowly towards goals . She reports she has been very busy and has not had much time to use her putty.PERFORMANCE DEFICITS: in functional skills including ADLs, IADLs, coordination, dexterity, sensation, edema, ROM, strength, pain, flexibility, Fine motor control, Gross motor control, mobility, balance, endurance, decreased knowledge of precautions, decreased knowledge of use of DME, wound, skin integrity, and UE functional use, cognitive skills including problem solving, and psychosocial skills including coping strategies, environmental adaptation, habits, interpersonal interactions, and routines and behaviors.   IMPAIRMENTS: are limiting patient from ADLs, IADLs, play, leisure, and social participation.   COMORBIDITIES: may have co-morbidities  that affects occupational performance. Patient will benefit from skilled OT to address above impairments  and improve overall function.  MODIFICATION OR ASSISTANCE TO COMPLETE EVALUATION: No modification of tasks or assist necessary to complete an evaluation.  OT OCCUPATIONAL PROFILE AND HISTORY: Detailed assessment: Review of records and additional review of physical, cognitive, psychosocial history related to current functional performance.  CLINICAL DECISION MAKING: LOW - limited treatment options, no task modification necessary  REHAB POTENTIAL: Good  EVALUATION COMPLEXITY: Low      PLAN:  OT FREQUENCY: 1x/week  OT DURATION: 10 weeks  PLANNED INTERVENTIONS: 97168 OT Re-evaluation, 97535 self care/ADL training, 02889 therapeutic exercise, 97530 therapeutic activity, 97112 neuromuscular re-education, 97140 manual therapy, 97035 ultrasound, 97018 paraffin, 02989 cryotherapy, 97034 contrast bath, 97014 electrical stimulation unattended, 97760 Orthotics management and training, 02239 Splinting (initial encounter), [id number] Subsequent splinting/medication, scar mobilization, passive range of motion, energy conservation, coping strategies training, patient/family education, and DME and/or AE instructions  RECOMMENDED OTHER SERVICES: n/a  CONSULTED AND AGREED WITH PLAN OF CARE: Patient  PLAN FOR NEXT SESSION:  washcloth scrunches, review putty exercises,    Ranika Mcniel, OT 02/17/2023, 4:07 PM

## 2023-02-17 NOTE — Patient Instructions (Addendum)
 Desensitization  Gently rub your middle fingertop with lotion, a soft towel or tap it gently to desensitize  Sensory precautions   Wear oven mits when placing items in the oven or removing  Use caution with your middle finger around hot surface since it is a little numb in your fingertip.

## 2023-02-19 DIAGNOSIS — R0689 Other abnormalities of breathing: Secondary | ICD-10-CM | POA: Diagnosis not present

## 2023-02-19 DIAGNOSIS — J984 Other disorders of lung: Secondary | ICD-10-CM | POA: Diagnosis not present

## 2023-02-25 ENCOUNTER — Ambulatory Visit: Payer: Medicare HMO | Admitting: Occupational Therapy

## 2023-02-25 DIAGNOSIS — M6281 Muscle weakness (generalized): Secondary | ICD-10-CM | POA: Diagnosis not present

## 2023-02-25 DIAGNOSIS — R208 Other disturbances of skin sensation: Secondary | ICD-10-CM

## 2023-02-25 DIAGNOSIS — M25642 Stiffness of left hand, not elsewhere classified: Secondary | ICD-10-CM

## 2023-02-25 DIAGNOSIS — M25641 Stiffness of right hand, not elsewhere classified: Secondary | ICD-10-CM | POA: Diagnosis not present

## 2023-02-25 DIAGNOSIS — R278 Other lack of coordination: Secondary | ICD-10-CM | POA: Diagnosis not present

## 2023-02-25 NOTE — Therapy (Signed)
OUTPATIENT OCCUPATIONAL THERAPY ORTHO treatment  Patient Name: Wanda Oneill MRN: [id number] DOB:1980/06/24, 43 y.o., female Today's Date: 02/25/2023  PCP: Dr. Patsy Lager REFERRING PROVIDER: Dr. Merlyn Lot  END OF SESSION:  OT End of Session - 02/25/23 1411     Visit Number 6    Number of Visits 10    Date for OT Re-Evaluation 03/04/23    Authorization Type [employer], MCD    Authorization - Visit Number 6    Progress Note Due on Visit 10    OT Start Time 1407    OT Stop Time 1445    OT Time Calculation (min) 38 min                Past Medical History:  Diagnosis Date   Ambulates with cane    4 prong   Diabetes mellitus    type II   Down syndrome    Down's syndrome    Family history of adverse reaction to anesthesia    mom has had n/v   Gastritis    Headache    Hepatic steatosis    Hidradenitis    Hypothyroidism    Irritable bowel syndrome    Osteoarthritis    Periumbilical hernia    Pneumonia 03/2020   frequent    Restless legs    Sleep apnea    Tendonitis    chronic in left foot   Thyroid disease    hypothyroidism   Urinary incontinence 02/2022   some   UTI (urinary tract infection)    just dx 04/2022 - rx amoxicillin   Past Surgical History:  Procedure Laterality Date   AMPUTATION Right 04/11/2022   Procedure: RIGHT LONG FINGER AMPUTATION;  Surgeon: Betha Loa, MD;  Location: [facility] OR;  Service: Orthopedics;  Laterality: Right;   AXILLARY HIDRADENITIS EXCISION Bilateral    CHOLECYSTECTOMY     HERNIA REPAIR     multiple   INCISION AND DRAINAGE ABSCESS Right 02/06/2022   Procedure: INCISION AND DRAINAGE RIGHT HAND;  Surgeon: Betha Loa, MD;  Location: [facility] OR;  Service: Orthopedics;  Laterality: Right;   INCISION AND DRAINAGE WOUND WITH NAILBED REPAIR Left 02/06/2022   Procedure: INCISION AND DRAINAGE LEFT INDEX FINGER;  Surgeon: Betha Loa, MD;  Location: [facility] OR;  Service: Orthopedics;  Laterality: Left;   INCISIONAL HERNIA REPAIR N/A 05/30/2015    Procedure: LAPAROSCOPIC INCISIONAL HERNIA;  Surgeon: Emelia Loron, MD;  Location: [facility] OR;  Service: General;  Laterality: N/A;   INSERTION OF MESH N/A 05/30/2015   Procedure: INSERTION OF MESH;  Surgeon: Emelia Loron, MD;  Location: [facility] OR;  Service: General;  Laterality: N/A;   KNEE ARTHROSCOPY     right knee   LAPAROSCOPY N/A 05/30/2015   Procedure: LAPAROSCOPY DIAGNOSTIC;  Surgeon: Emelia Loron, MD;  Location: [facility] OR;  Service: General;  Laterality: N/A;   PARTIAL KNEE ARTHROPLASTY Right 10/30/2017   Procedure: UNICOMPARTMENTAL RIGHT KNEE LATERAL;  Surgeon: Durene Romans, MD;  Location: [facility] ORS;  Service: Orthopedics;  Laterality: Right;  90 mins   PARTIAL KNEE ARTHROPLASTY Left 06/29/2020   Procedure: UNICOMPARTMENTAL KNEE LATERALLY;  Surgeon: Durene Romans, MD;  Location: [facility] ORS;  Service: Orthopedics;  Laterality: Left;  70 MINS   TONSILLECTOMY     adenoids   Patient Active Problem List   Diagnosis Date Noted   OSA on CPAP 12/12/2022   DOE (dyspnea on exertion) 08/21/2022   Furuncles 02/08/2022   Infection of flexor tendon sheath 02/06/2022   Finger osteomyelitis, right (HCC) 02/06/2022  Leukocytosis 02/06/2022   History of COVID-19 02/06/2022   IBS (irritable bowel syndrome) 02/06/2022   Hypertriglyceridemia 02/06/2022   Obesity, Class III, BMI 40-49.9 (morbid obesity) (HCC) 02/06/2022   Chronic right-sided low back pain 11/02/2021   Bilateral trigger thumb 06/18/2021   Chronic paronychia of finger 01/25/2021   S/P left unicompartmental knee replacement, lateral 06/29/2020   At risk for obstructive sleep apnea 06/11/2019   Cough 06/11/2019   Hyperglycemia 05/26/2019   Acute hypoxemic respiratory failure (HCC) 05/20/2019   Fibromyalgia 03/09/2019   Neck pain, chronic 03/09/2019   Acute shoulder bursitis, right 02/03/2019   Bile salt-induced diarrhea 09/25/2018   Arthritis of left acromioclavicular joint 09/23/2018   Precordial chest pain 08/11/2018   Morbid  obesity (HCC) 10/31/2017   S/P right UKR, lateral 10/30/2017   Incarcerated epigastric hernia 05/30/2015   Left foot pain 08/09/2014   Chronic diarrhea 07/27/2014   Gout 08/10/2012   Acid reflux 01/22/2012   Down syndrome 11/11/2011   Hernia of abdominal wall 10/21/2011   Psoriasis 10/21/2011   Uncontrolled type 2 diabetes mellitus with hyperglycemia, with long-term current use of insulin (HCC) 04/12/2011   Hypothyroid 04/12/2011   Gait abnormality 06/22/2010   Tibial tendinitis, posterior 06/22/2010    ONSET DATE: 12/17/22 referral date  REFERRING DIAG:  Diagnosis  L03.012 (ICD-10-CM) - Cellulitis of left finger  L02.511 (ICD-10-CM) - Cutaneous abscess of right hand  other infective tenosynovitis unspecified site M65.10  THERAPY DIAG:  Stiffness of right hand, not elsewhere classified  Stiffness of left hand, not elsewhere classified  Muscle weakness (generalized)  Other disturbances of skin sensation  Other lack of coordination  Rationale for Evaluation and Treatment: Rehabilitation  SUBJECTIVE:   SUBJECTIVE STATEMENT: Pt reports she brought her putty Pt accompanied by: self  PERTINENT HISTORY: hx of R long finger amputation through distal phalanx 04/11/22, healing wound at the edge of nail left long finger which is now closed PMH: Down's syndrome, DM  PRECAUTIONS: None, cleared for R hand use to tolerance   WEIGHT BEARING RESTRICTIONS: No  PAIN:  Are you having pain? Yes: NPRS scale: 4/10 Pain location: right shoulder Pain description: aching Aggravating factors: use Relieving factors: heat    FALLS: Has patient fallen in last 6 months?  no reports of falls  LIVING ENVIRONMENT: Lives with: lives with their family and lives alone Lives in: House/apartment Stairs: No Has following equipment at home: Single point cane  PLOF: Independent with basic ADLs  PATIENT GOALS: improve strength in her hands   NEXT MD VISIT: prn  OBJECTIVE:  Note: Objective  measures were completed at Evaluation unless otherwise noted.  HAND DOMINANCE: Right  ADLs: Overall ADLs: mod I with all basic ADLs. Transfers/ambulation related to ADLs:  Tub Shower transfers: mod I,  with shower seat ,bath tub and shower, pt steps over into to tub Equipment: Shower seat with back    UPPER EXTREMITY ROM:       Active ROM Right eval Left eval  Thumb MCP (0-60)    Thumb IP (0-80)    Thumb Radial abd/add (0-55)     Thumb Palmar abd/add (0-45)     Thumb Opposition to Small Finger     Index MCP (0-90) 75    Index PIP (0-100) 85    Index DIP (0-70)      Long MCP (0-90) 70   80  Long PIP (0-100) 55  65   Long DIP (0-70)    45  Ring MCP (0-90) 80  Ring PIP (0-100) 80     Ring DIP (0-70)      Little MCP (0-90) 65     Little PIP (0-100) 80      (Blank rows = not tested)  HAND FUNCTION: Grip strength: Right: 35 lbs; Left: 37 lbs  COORDINATION: 9 Hole Peg test: Right: 1 min 25 sec; Left: 1 min 40  sec  SENSATION: Light touch: Impaired   EDEMA: mild in R middle finger, DIP joint has been amputated and is healed with skin flap over tip. Pt with edema at left long finger around nail.  COGNITION: Overall cognitive status: History of cognitive impairments - at baseline   OBSERVATIONS: Pleasant female who is cooperative to work in therapy   TODAY'S TREATMENT:                                                                                                                              DATE:02/25/23 Hotpack to right hand x 6 mins  for stiffness  A/ROM composite flexion, RUE PIP blocking  for right middle finger,then review of red putty HEP for bilateral UE's -min v.c and demonstration while hotpack applied to right shoulder x 8 mins  as pt reports stiffness and pain, no adverse reactions Pt practiced printing the alphabet using foam grip with good legibility and letter size.  02/17/23-A/ROM composite flexion, RUE PIP blocking  for right middle  finger Therapist reviewed desensitization and sensory precautions. Tracing activity with foam grip, min difficulty/ v.c Handwriting activity with foam grip, min v.c for performance, pt with variable legibility of handwriting. Pt squeezed foam squeeze ball 2 sets of 10 reps with each hand for increased grip strength, min v.c  Placing large pegs into pegboard with RUE for increased functional use min v.c for functional use of middle finger  01/27/23 Hotpack applied to R hand for 6  mins for stiffness, no adverse reactions. A/ROM MP flexion, composite finger flexion/ extension, passive composite finger flexion bilateral hands and  PIP blocking for right and left middle fingers min v.c and 10 reps Pt was instructed in red putty HEP for RUE, see pt instructions. Writing using foam grip and min assist for positioning. Pt demonstrates improved legibility of handwririting and foam grip was issued for home.  01/14/23- Hotpack applied to R hand 5  mins for stiffness, no adverse reactions. A/ROM MP flexion, composite finger flexion/ extension, PIP blocking for RUE  min v.c and 10 reps Bandaid was applied to middle finger of LUE as pt ha several healing wounds( glove applied over fingertip during coordination exercises. Pt was instructed in a fine motor coordiantion HEP for bilateral UE's, min-mod v.c and demosntration.  01/07/23- MP flexion, for RUE, composite flexion bilateral UE's and PIP blocking exercises for middle finger of right and left hands 10 reps each, min v.c and demonstration. initiated education about desensitization. Session ended early due to pt had another medical appointment.  12/24/22- eval completed  PATIENT EDUCATION: Education details:red putty HEP review Person educated: Patient Education method: Explanation,, Verbal cues, handout Education comprehension: verbalized understandingv.c  HOME EXERCISE PROGRAM: 12/24/22- inital A/ROM HEP desensitization, sensory prec red putty  HEP- 01/27/23  GOALS: Goals reviewed with patient? Yes  SHORT TERM GOALS: Target date: 01/22/23  I with inital HEP  Goal status: met, 01/14/23  2.  Pt will increase R middle finger MP flexion to 75* for increased funcitonal use. Baseline: 70 Goal status:met, 80* 12/23  3.  Pt will increase R middle finger PIP flexion to 65* for increased functional use. Baseline: 55 Goal status:met, 65 12/23  4.  Pt will increase L middle finger PIP flexion to 75*for increased functional use. Baseline: 65 Goal status:  ongoing 65*, 01/27/23  5.  I with sensory precautions and desensitization techniques.   Goal status:  met pt verbalizes understanding 02/17/23  LONG TERM GOALS: Target date: 03/04/23  I with updated HEP.   Goal status: ongoing  2.  Pt will increased bilateral grip strength by 5 lbs for increased UE functional use. Baseline: RUE 35, LUE 37. Goal status: ongoing 20 lbs bilaterally  3.  Pt will improve RUE fine motor coordination as evidenced by decreasing 9 hole peg test score by 5 secs,  Goal status: ongoing  4.  Pt will improve LUE fine motor coordination as evidenced by decreasing 9 hole peg test score by 5 secs Goal status:  ongoing  5.  Pt will report increased ease with using her RUE to write and hold her cane.  Goal status: ongoing, pt demonstrates improving use of RUE for these tasks     ASSESSMENT:  CLINICAL IMPRESSION: Pt is progressing slowly towards goals. Pt demonstrates understanding of putty HEP following review. Discussion with pt regarding plans to d/c from therapy next visit. Pt verbalized understanding.Marland KitchenPERFORMANCE DEFICITS: in functional skills including ADLs, IADLs, coordination, dexterity, sensation, edema, ROM, strength, pain, flexibility, Fine motor control, Gross motor control, mobility, balance, endurance, decreased knowledge of precautions, decreased knowledge of use of DME, wound, skin integrity, and UE functional use, cognitive skills  including problem solving, and psychosocial skills including coping strategies, environmental adaptation, habits, interpersonal interactions, and routines and behaviors.   IMPAIRMENTS: are limiting patient from ADLs, IADLs, play, leisure, and social participation.   COMORBIDITIES: may have co-morbidities  that affects occupational performance. Patient will benefit from skilled OT to address above impairments and improve overall function.  MODIFICATION OR ASSISTANCE TO COMPLETE EVALUATION: No modification of tasks or assist necessary to complete an evaluation.  OT OCCUPATIONAL PROFILE AND HISTORY: Detailed assessment: Review of records and additional review of physical, cognitive, psychosocial history related to current functional performance.  CLINICAL DECISION MAKING: LOW - limited treatment options, no task modification necessary  REHAB POTENTIAL: Good  EVALUATION COMPLEXITY: Low      PLAN:  OT FREQUENCY: 1x/week  OT DURATION: 10 weeks  PLANNED INTERVENTIONS: 97168 OT Re-evaluation, 97535 self care/ADL training, 16109 therapeutic exercise, 97530 therapeutic activity, 97112 neuromuscular re-education, 97140 manual therapy, 97035 ultrasound, 97018 paraffin, 60454 cryotherapy, 97034 contrast bath, 97014 electrical stimulation unattended, 97760 Orthotics management and training, 09811 Splinting (initial encounter), [id number] Subsequent splinting/medication, scar mobilization, passive range of motion, energy conservation, coping strategies training, patient/family education, and DME and/or AE instructions  RECOMMENDED OTHER SERVICES: n/a  CONSULTED AND AGREED WITH PLAN OF CARE: Patient  PLAN FOR NEXT SESSION:   check goals and d/c next visit.   Mikyah Alamo, OT 02/25/2023, 2:12 PM

## 2023-02-27 DIAGNOSIS — J219 Acute bronchiolitis, unspecified: Secondary | ICD-10-CM | POA: Diagnosis not present

## 2023-02-28 ENCOUNTER — Ambulatory Visit: Payer: Medicare HMO | Admitting: Sports Medicine

## 2023-03-03 ENCOUNTER — Ambulatory Visit: Payer: Medicare HMO

## 2023-03-03 ENCOUNTER — Ambulatory Visit (INDEPENDENT_AMBULATORY_CARE_PROVIDER_SITE_OTHER): Payer: Medicare HMO | Admitting: Sports Medicine

## 2023-03-03 DIAGNOSIS — M47816 Spondylosis without myelopathy or radiculopathy, lumbar region: Secondary | ICD-10-CM | POA: Diagnosis not present

## 2023-03-03 DIAGNOSIS — G8929 Other chronic pain: Secondary | ICD-10-CM

## 2023-03-03 DIAGNOSIS — M542 Cervicalgia: Secondary | ICD-10-CM

## 2023-03-03 DIAGNOSIS — M4312 Spondylolisthesis, cervical region: Secondary | ICD-10-CM | POA: Diagnosis not present

## 2023-03-03 DIAGNOSIS — M47812 Spondylosis without myelopathy or radiculopathy, cervical region: Secondary | ICD-10-CM | POA: Diagnosis not present

## 2023-03-03 NOTE — Progress Notes (Signed)
    Procedures performed today:    None.  Independent interpretation of notes and tests performed by another provider:   None.  Brief History, Exam, Impression, and Recommendations:    Neck pain, chronic This very pleasant 43 year old female has worsening right sided axial neck pain with occasional radiation up the occiput. She did have a cervical spine MRI in 2021 that showed multilevel cervical DDD, mild with some left-sided C5-C6 facet arthropathy. I do think we are dealing with a recurrence of her cervical spondylitic disease. Avoiding prednisone, her last A1c was in the 11's. She does have some hydrocodone at home, if they run out I am happy to refill it, we will add updated neck x-rays, formal PT. If not sufficiently better after 6 weeks of this we will consider updated MRI and epidurals.  Lumbar facet joint syndrome That he is also started to have increasing right sided low back pain, she had a CT done that showed multiple spondylosis including a right SI joint osteoarthritis and multilevel lower lumbar facet arthritis. We tried an SI joint injection with ultrasound guidance and she did not get relief, we then switched the target to the right L3-S1 facet joints, she did get excellent relief in March 2024. Now having recurrence of pain, her diabetes is relatively uncontrolled so I am hesitant to order an additional set of steroid injections so we will proceed with facet medial branch blocks and RFA. She will do some therapy for this as well. I am happy to refill narcotics and discussed neuropathics in the future as well.    ____________________________________________ Ihor Austin. Benjamin Stain, M.D., ABFM., CAQSM., AME. Primary Care and Sports Medicine [facility]  Adjunct Professor of Family Medicine  [employer]  Restaurant manager, fast food

## 2023-03-03 NOTE — Assessment & Plan Note (Signed)
This very pleasant 43 year old female has worsening right sided axial neck pain with occasional radiation up the occiput. She did have a cervical spine MRI in 2021 that showed multilevel cervical DDD, mild with some left-sided C5-C6 facet arthropathy. I do think we are dealing with a recurrence of her cervical spondylitic disease. Avoiding prednisone, her last A1c was in the 11's. She does have some hydrocodone at home, if they run out I am happy to refill it, we will add updated neck x-rays, formal PT. If not sufficiently better after 6 weeks of this we will consider updated MRI and epidurals.

## 2023-03-03 NOTE — Assessment & Plan Note (Signed)
That he is also started to have increasing right sided low back pain, she had a CT done that showed multiple spondylosis including a right SI joint osteoarthritis and multilevel lower lumbar facet arthritis. We tried an SI joint injection with ultrasound guidance and she did not get relief, we then switched the target to the right L3-S1 facet joints, she did get excellent relief in March 2024. Now having recurrence of pain, her diabetes is relatively uncontrolled so I am hesitant to order an additional set of steroid injections so we will proceed with facet medial branch blocks and RFA. She will do some therapy for this as well. I am happy to refill narcotics and discussed neuropathics in the future as well.

## 2023-03-04 ENCOUNTER — Ambulatory Visit: Payer: Medicare HMO | Admitting: Occupational Therapy

## 2023-03-04 DIAGNOSIS — M6281 Muscle weakness (generalized): Secondary | ICD-10-CM | POA: Diagnosis not present

## 2023-03-04 DIAGNOSIS — R278 Other lack of coordination: Secondary | ICD-10-CM | POA: Diagnosis not present

## 2023-03-04 DIAGNOSIS — R208 Other disturbances of skin sensation: Secondary | ICD-10-CM

## 2023-03-04 DIAGNOSIS — M25641 Stiffness of right hand, not elsewhere classified: Secondary | ICD-10-CM

## 2023-03-04 DIAGNOSIS — M25642 Stiffness of left hand, not elsewhere classified: Secondary | ICD-10-CM | POA: Diagnosis not present

## 2023-03-04 NOTE — Therapy (Unsigned)
OUTPATIENT OCCUPATIONAL THERAPY ORTHO treatment  Patient Name: Wanda Oneill MRN: [id number] DOB:May 25, 1980, 43 y.o., female Today's Date: 03/05/2023  PCP: Dr. Patsy Lager REFERRING PROVIDER: Dr. Merlyn Lot OCCUPATIONAL THERAPY DISCHARGE SUMMARY    Current functional level related to goals / functional outcomes: See goals below for progress.Pt made progress towards goals with improved A/ROM. Pt did not meet fine motor coordinatioon goals however she reports she has not been consistently perfroming fine motor coordination HEP at home.   Remaining deficits: dereased strength, decreased ROM, decreased fine motor coordination   Education / Equipment: Pt was instructed regarding desensitization and HEP's. She demonstrates understanding.   Patient agrees to discharge. Patient goals were met. Patient is being discharged due to maximized rehab potential. .    END OF SESSION:  OT End of Session - 03/05/23 0919     Visit Number 7    Number of Visits 10    Date for OT Re-Evaluation 03/04/23    Authorization Type [employer], M[employer]    Authorization - Visit Number 7    Progress Note Due on Visit 10    OT Start Time 1535    OT Stop Time 1615    OT Time Calculation (min) 40 min                 Past Medical History:  Diagnosis Date   Ambulates with cane    4 prong   Diabetes mellitus    type II   Down syndrome    Down's syndrome    Family history of adverse reaction to anesthesia    mom has had n/v   Gastritis    Headache    Hepatic steatosis    Hidradenitis    Hypothyroidism    Irritable bowel syndrome    Osteoarthritis    Periumbilical hernia    Pneumonia 03/2020   frequent    Restless legs    Sleep apnea    Tendonitis    chronic in left foot   Thyroid disease    hypothyroidism   Urinary incontinence 02/2022   some   UTI (urinary tract infection)    just dx 04/2022 - rx amoxicillin   Past Surgical History:  Procedure Laterality Date   AMPUTATION Right 04/11/2022    Procedure: RIGHT LONG FINGER AMPUTATION;  Surgeon: Betha Loa, MD;  Location: [facility] OR;  Service: Orthopedics;  Laterality: Right;   AXILLARY HIDRADENITIS EXCISION Bilateral    CHOLECYSTECTOMY     HERNIA REPAIR     multiple   INCISION AND DRAINAGE ABSCESS Right 02/06/2022   Procedure: INCISION AND DRAINAGE RIGHT HAND;  Surgeon: Betha Loa, MD;  Location: [facility] OR;  Service: Orthopedics;  Laterality: Right;   INCISION AND DRAINAGE WOUND WITH NAILBED REPAIR Left 02/06/2022   Procedure: INCISION AND DRAINAGE LEFT INDEX FINGER;  Surgeon: Betha Loa, MD;  Location: [facility] OR;  Service: Orthopedics;  Laterality: Left;   INCISIONAL HERNIA REPAIR N/A 05/30/2015   Procedure: LAPAROSCOPIC INCISIONAL HERNIA;  Surgeon: Emelia Loron, MD;  Location: [facility] OR;  Service: General;  Laterality: N/A;   INSERTION OF MESH N/A 05/30/2015   Procedure: INSERTION OF MESH;  Surgeon: Emelia Loron, MD;  Location: [facility] OR;  Service: General;  Laterality: N/A;   KNEE ARTHROSCOPY     right knee   LAPAROSCOPY N/A 05/30/2015   Procedure: LAPAROSCOPY DIAGNOSTIC;  Surgeon: Emelia Loron, MD;  Location: [facility] OR;  Service: General;  Laterality: N/A;   PARTIAL KNEE ARTHROPLASTY Right 10/30/2017   Procedure: UNICOMPARTMENTAL RIGHT KNEE LATERAL;  Surgeon: Durene Romans, MD;  Location: [facility] ORS;  Service: Orthopedics;  Laterality: Right;  90 mins   PARTIAL KNEE ARTHROPLASTY Left 06/29/2020   Procedure: UNICOMPARTMENTAL KNEE LATERALLY;  Surgeon: Durene Romans, MD;  Location: [facility] OR[facility];  Service: Orthopedics;  Laterality: Left;  70 MINS   TONSILLECTOMY     adenoids   Patient Active Problem List   Diagnosis Date Noted   OSA on CPAP 12/12/2022   DOE (dyspnea on exertion) 08/21/2022   Furuncles 02/08/2022   Infection of flexor tendon sheath 02/06/2022   Finger osteomyelitis, right (HCC) 02/06/2022   Leukocytosis 02/06/2022   History of COVID-19 02/06/2022   IBS (irritable bowel syndrome) 02/06/2022   Hypertriglyceridemia  02/06/2022   Obesity, Class III, BMI 40-49.9 (morbid obesity) (HCC) 02/06/2022   Lumbar facet joint syndrome 11/02/2021   Bilateral trigger thumb 06/18/2021   Chronic paronychia of finger 01/25/2021   S/P left unicompartmental knee replacement, lateral 06/29/2020   At risk for obstructive sleep apnea 06/11/2019   Cough 06/11/2019   Hyperglycemia 05/26/2019   Acute hypoxemic respiratory failure (HCC) 05/20/2019   Fibromyalgia 03/09/2019   Neck pain, chronic 03/09/2019   Acute shoulder bursitis, right 02/03/2019   Bile salt-induced diarrhea 09/25/2018   Arthritis of left acromioclavicular joint 09/23/2018   Precordial chest pain 08/11/2018   Morbid obesity (HCC) 10/31/2017   S/P right UKR, lateral 10/30/2017   Incarcerated epigastric hernia 05/30/2015   Left foot pain 08/09/2014   Chronic diarrhea 07/27/2014   Gout 08/10/2012   Acid reflux 01/22/2012   Down syndrome 11/11/2011   Hernia of abdominal wall 10/21/2011   Psoriasis 10/21/2011   Uncontrolled type 2 diabetes mellitus with hyperglycemia, with long-term current use of insulin (HCC) 04/12/2011   Hypothyroid 04/12/2011   Gait abnormality 06/22/2010   Tibial tendinitis, posterior 06/22/2010    ONSET DATE: 12/17/22 referral date  REFERRING DIAG:  Diagnosis  L03.012 (ICD-10-CM) - Cellulitis of left finger  L02.511 (ICD-10-CM) - Cutaneous abscess of right hand  other infective tenosynovitis unspecified site M65.10  THERAPY DIAG:  Stiffness of right hand, not elsewhere classified  Stiffness of left hand, not elsewhere classified  Muscle weakness (generalized)  Other disturbances of skin sensation  Other lack of coordination  Rationale for Evaluation and Treatment: Rehabilitation  SUBJECTIVE:   SUBJECTIVE STATEMENT: Pt reports she brought her putty Pt accompanied by: self  PERTINENT HISTORY: hx of R long finger amputation through distal phalanx 04/11/22, healing wound at the edge of nail left long finger which is  now closed PMH: Down's syndrome, DM  PRECAUTIONS: None, cleared for R hand use to tolerance   WEIGHT BEARING RESTRICTIONS: No  PAIN:  Are you having pain? Yes: NPRS scale: 5-6/10 Pain location: right shoulder Pain description: aching Aggravating factors: use Relieving factors: heat    FALLS: Has patient fallen in last 6 months?  no reports of falls  LIVING ENVIRONMENT: Lives with: lives with their family and lives alone Lives in: House/apartment Stairs: No Has following equipment at home: Single point cane  PLOF: Independent with basic ADLs  PATIENT GOALS: improve strength in her hands   NEXT MD VISIT: prn  OBJECTIVE:  Note: Objective measures were completed at Evaluation unless otherwise noted.  HAND DOMINANCE: Right  ADLs: Overall ADLs: mod I with all basic ADLs. Transfers/ambulation related to ADLs:  Tub Shower transfers: mod I,  with shower seat ,bath tub and shower, pt steps over into to tub Equipment: Shower seat with back    UPPER EXTREMITY ROM:  Active ROM Right eval Left eval  Thumb MCP (0-60)    Thumb IP (0-80)    Thumb Radial abd/add (0-55)     Thumb Palmar abd/add (0-45)     Thumb Opposition to Small Finger     Index MCP (0-90) 75    Index PIP (0-100) 85    Index DIP (0-70)      Long MCP (0-90) 70   80  Long PIP (0-100) 55  65   Long DIP (0-70)    45  Ring MCP (0-90) 80     Ring PIP (0-100) 80     Ring DIP (0-70)      Little MCP (0-90) 65     Little PIP (0-100) 80      (Blank rows = not tested)  HAND FUNCTION: Grip strength: Right: 35 lbs; Left: 37 lbs  COORDINATION: 9 Hole Peg test: Right: 1 min 25 sec; Left: 1 min 40  sec  SENSATION: Light touch: Impaired   EDEMA: mild in R middle finger, DIP joint has been amputated and is healed with skin flap over tip. Pt with edema at left long finger around nail.  COGNITION: Overall cognitive status: History of cognitive impairments - at baseline   OBSERVATIONS: Pleasant female  who is cooperative to work in therapy   TODAY'S TREATMENT:                                                                                                                              DATE:03/04/23- Hotpack to right hand x 7 mins for stiffness. Reviewed composite finger flexion and PIP blocking for RUE 10 reps each min v.c Reveiwed red puttty exercises for grip and sustained pinch 10-20 reps each for bilateral hands. (hot pack applied to Right shoulder x 8 mins while perfroming exercises due to stiffness and pain, no adverse reactions.) Pt's mom attended the last 5-10 mins therapy. she obeserved the putty exercises and therapist discussed with pt and mom importance of pt performing fine motor tasks at home such as picking up coins, dried beans or flipping playing cards to maintain pt's fine motor coordination.   02/25/23 Hotpack to right hand x 6 mins  for stiffness  A/ROM composite flexion, RUE PIP blocking  for right middle finger,then review of red putty HEP for bilateral UE's -min v.c and demonstration while hotpack applied to right shoulder x 8 mins  as pt reports stiffness and pain, no adverse reactions Pt practiced printing the alphabet using foam grip with good legibility and letter size.  02/17/23-A/ROM composite flexion, RUE PIP blocking  for right middle finger Therapist reviewed desensitization and sensory precautions. Tracing activity with foam grip, min difficulty/ v.c Handwriting activity with foam grip, min v.c for performance, pt with variable legibility of handwriting. Pt squeezed foam squeeze ball 2 sets of 10 reps with each hand for increased grip strength, min v.c  Placing large pegs into pegboard with RUE for increased functional use  min v.c for functional use of middle finger  01/27/23 Hotpack applied to R hand for 6  mins for stiffness, no adverse reactions. A/ROM MP flexion, composite finger flexion/ extension, passive composite finger flexion bilateral hands and  PIP  blocking for right and left middle fingers min v.c and 10 reps Pt was instructed in red putty HEP for RUE, see pt instructions. Writing using foam grip and min assist for positioning. Pt demonstrates improved legibility of handwririting and foam grip was issued for home.  01/14/23- Hotpack applied to R hand 5  mins for stiffness, no adverse reactions. A/ROM MP flexion, composite finger flexion/ extension, PIP blocking for RUE  min v.c and 10 reps Bandaid was applied to middle finger of LUE as pt ha several healing wounds( glove applied over fingertip during coordination exercises. Pt was instructed in a fine motor coordiantion HEP for bilateral UE's, min-mod v.c and demosntration.  01/07/23- MP flexion, for RUE, composite flexion bilateral UE's and PIP blocking exercises for middle finger of right and left hands 10 reps each, min v.c and demonstration. initiated education about desensitization. Session ended early due to pt had another medical appointment.  12/24/22- eval completed    PATIENT EDUCATION: Education details:checked progress towards goals and discussed overall progress. Reviewed A/ROM exercises, putty exercises and discussed fine motor exercises to perform at home Person educated: Patient, mom Education method: Explanation,, Verbal cues,  Education comprehension: verbalized understandingv.c  HOME EXERCISE PROGRAM: 12/24/22- inital A/ROM HEP desensitization, sensory prec red putty HEP- 01/27/23  GOALS: Goals reviewed with patient? Yes  SHORT TERM GOALS: Target date: 01/22/23  I with inital HEP  Goal status: met, 01/14/23  2.  Pt will increase R middle finger MP flexion to 75* for increased functional use. Baseline: 70 Goal status:met, 80*- 03/04/23  3.  Pt will increase R middle finger PIP flexion to 65* for increased functional use. Baseline: 55 Goal status:met, 65 -03/04/23  4.  Pt will increase L middle finger PIP flexion to 75*for increased functional  use. Baseline: 65 Goal status:  not met 70- 03/04/23  5.  I with sensory precautions and desensitization techniques.   Goal status:  met pt verbalizes understanding 02/17/23  LONG TERM GOALS: Target date: 03/04/23  I with updated HEP.   Goal status:met, for putty 03/04/23  2.  Pt will increased bilateral grip strength by 5 lbs for increased UE functional use. Baseline: RUE 35, LUE 37. Goal status: not met  RUE 14 lbs, LUE 15 lbs 03/04/23  3.  Pt will improve RUE fine motor coordination as evidenced by decreasing 9 hole peg test score by 5 secs,  Goal status: not met, 2 min 34 secs 03/04/23  4.  Pt will improve LUE fine motor coordination as evidenced by decreasing 9 hole peg test score by 5 secs Goal status:  not met-1 min 39 secs 03/04/23  5.  Pt will report increased ease with using her RUE to write and hold her cane.  Goal status:  met, 03/04/23- per pt report    ASSESSMENT:  CLINICAL IMPRESSION  Pt made good overall progress with improved A/ROM and decreased sensitivity. Pt did not fully achieve coordination and grip strength goals as pt reports she has not been consistently performing exercises at home.  PERFORMANCE DEFICITS: in functional skills including ADLs, IADLs, coordination, dexterity, sensation, edema, ROM, strength, pain, flexibility, Fine motor control, Gross motor control, mobility, balance, endurance, decreased knowledge of precautions, decreased knowledge of use of DME, wound, skin integrity, and  UE functional use, cognitive skills including problem solving, and psychosocial skills including coping strategies, environmental adaptation, habits, interpersonal interactions, and routines and behaviors.   IMPAIRMENTS: are limiting patient from ADLs, IADLs, play, leisure, and social participation.   COMORBIDITIES: may have co-morbidities  that affects occupational performance. Patient will benefit from skilled OT to address above impairments and improve overall  function.  MODIFICATION OR ASSISTANCE TO COMPLETE EVALUATION: No modification of tasks or assist necessary to complete an evaluation.  OT OCCUPATIONAL PROFILE AND HISTORY: Detailed assessment: Review of records and additional review of physical, cognitive, psychosocial history related to current functional performance.  CLINICAL DECISION MAKING: LOW - limited treatment options, no task modification necessary  REHAB POTENTIAL: Good  EVALUATION COMPLEXITY: Low      PLAN:  OT FREQUENCY: 1x/week  OT DURATION: 10 weeks  PLANNED INTERVENTIONS: 97168 OT Re-evaluation, 97535 self care/ADL training, 16109 therapeutic exercise, 97530 therapeutic activity, 97112 neuromuscular re-education, 97140 manual therapy, 97035 ultrasound, 97018 paraffin, 60454 cryotherapy, 97034 contrast bath, 97014 electrical stimulation unattended, 97760 Orthotics management and training, 09811 Splinting (initial encounter), [id number] Subsequent splinting/medication, scar mobilization, passive range of motion, energy conservation, coping strategies training, patient/family education, and DME and/or AE instructions  RECOMMENDED OTHER SERVICES: n/a  CONSULTED AND AGREED WITH PLAN OF CARE: Patient  PLAN FOR NEXT SESSION:   d/c OT, pt has HEP's to work on at home   Nycere Presley, OT 03/05/2023, 9:20 AM

## 2023-03-04 NOTE — Addendum Note (Signed)
Addended by: Monica Becton on: 03/04/2023 12:58 PM   Modules accepted: Orders

## 2023-03-12 ENCOUNTER — Encounter: Payer: Self-pay | Admitting: Sports Medicine

## 2023-03-13 ENCOUNTER — Encounter: Payer: Self-pay | Admitting: Physical Therapy

## 2023-03-13 ENCOUNTER — Ambulatory Visit: Payer: Medicare HMO | Attending: Sports Medicine | Admitting: Physical Therapy

## 2023-03-13 DIAGNOSIS — R252 Cramp and spasm: Secondary | ICD-10-CM | POA: Diagnosis present

## 2023-03-13 DIAGNOSIS — M25641 Stiffness of right hand, not elsewhere classified: Secondary | ICD-10-CM | POA: Diagnosis present

## 2023-03-13 DIAGNOSIS — M542 Cervicalgia: Secondary | ICD-10-CM | POA: Diagnosis present

## 2023-03-13 DIAGNOSIS — M25511 Pain in right shoulder: Secondary | ICD-10-CM | POA: Diagnosis present

## 2023-03-13 DIAGNOSIS — G8929 Other chronic pain: Secondary | ICD-10-CM | POA: Diagnosis not present

## 2023-03-13 NOTE — Therapy (Signed)
 OUTPATIENT PHYSICAL THERAPY CERVICAL EVALUATION   Patient Name: Wanda Oneill MRN: [id number] DOB:01/29/1981, 43 y.o., female Today's Date: 03/13/2023  END OF SESSION:  PT End of Session - 03/13/23 1657     Visit Number 1    Number of Visits 13    Date for PT Re-Evaluation 06/10/23    Authorization Type [employer]    PT Start Time 1657    PT Stop Time 1744    PT Time Calculation (min) 47 min    Activity Tolerance Patient tolerated treatment well    Behavior During Therapy WFL for tasks assessed/performed             Past Medical History:  Diagnosis Date   Ambulates with cane    4 prong   Diabetes mellitus    type II   Down syndrome    Down's syndrome    Family history of adverse reaction to anesthesia    mom has had n/v   Gastritis    Headache    Hepatic steatosis    Hidradenitis    Hypothyroidism    Irritable bowel syndrome    Osteoarthritis    Periumbilical hernia    Pneumonia 03/2020   frequent    Restless legs    Sleep apnea    Tendonitis    chronic in left foot   Thyroid  disease    hypothyroidism   Urinary incontinence 02/2022   some   UTI (urinary tract infection)    just dx 04/2022 - rx amoxicillin    Past Surgical History:  Procedure Laterality Date   AMPUTATION Right 04/11/2022   Procedure: RIGHT LONG FINGER AMPUTATION;  Surgeon: Murrell Drivers, MD;  Location: [facility] OR;  Service: Orthopedics;  Laterality: Right;   AXILLARY HIDRADENITIS EXCISION Bilateral    CHOLECYSTECTOMY     HERNIA REPAIR     multiple   INCISION AND DRAINAGE ABSCESS Right 02/06/2022   Procedure: INCISION AND DRAINAGE RIGHT HAND;  Surgeon: Murrell Drivers, MD;  Location: [facility] OR;  Service: Orthopedics;  Laterality: Right;   INCISION AND DRAINAGE WOUND WITH NAILBED REPAIR Left 02/06/2022   Procedure: INCISION AND DRAINAGE LEFT INDEX FINGER;  Surgeon: Murrell Drivers, MD;  Location: [facility] OR;  Service: Orthopedics;  Laterality: Left;   INCISIONAL HERNIA REPAIR N/A 05/30/2015   Procedure:  LAPAROSCOPIC INCISIONAL HERNIA;  Surgeon: Donnice Bury, MD;  Location: [facility] OR;  Service: General;  Laterality: N/A;   INSERTION OF MESH N/A 05/30/2015   Procedure: INSERTION OF MESH;  Surgeon: Donnice Bury, MD;  Location: [facility] OR;  Service: General;  Laterality: N/A;   KNEE ARTHROSCOPY     right knee   LAPAROSCOPY N/A 05/30/2015   Procedure: LAPAROSCOPY DIAGNOSTIC;  Surgeon: Donnice Bury, MD;  Location: [facility] OR;  Service: General;  Laterality: N/A;   PARTIAL KNEE ARTHROPLASTY Right 10/30/2017   Procedure: UNICOMPARTMENTAL RIGHT KNEE LATERAL;  Surgeon: Ernie Donnice, MD;  Location: [facility] ORS;  Service: Orthopedics;  Laterality: Right;  90 mins   PARTIAL KNEE ARTHROPLASTY Left 06/29/2020   Procedure: UNICOMPARTMENTAL KNEE LATERALLY;  Surgeon: Ernie Donnice, MD;  Location: [facility];  Service: Orthopedics;  Laterality: Left;  70 MINS   TONSILLECTOMY     adenoids   Patient Active Problem List   Diagnosis Date Noted   OSA on CPAP 12/12/2022   DOE (dyspnea on exertion) 08/21/2022   Furuncles 02/08/2022   Infection of flexor tendon sheath 02/06/2022   Finger osteomyelitis, right (HCC) 02/06/2022   Leukocytosis 02/06/2022   History of COVID-19 02/06/2022  IBS (irritable bowel syndrome) 02/06/2022   Hypertriglyceridemia 02/06/2022   Obesity, Class III, BMI 40-49.9 (morbid obesity) (HCC) 02/06/2022   Lumbar facet joint syndrome 11/02/2021   Bilateral trigger thumb 06/18/2021   Chronic paronychia of finger 01/25/2021   S/P left unicompartmental knee replacement, lateral 06/29/2020   At risk for obstructive sleep apnea 06/11/2019   Cough 06/11/2019   Hyperglycemia 05/26/2019   Acute hypoxemic respiratory failure (HCC) 05/20/2019   Fibromyalgia 03/09/2019   Neck pain, chronic 03/09/2019   Acute shoulder bursitis, right 02/03/2019   Bile salt-induced diarrhea 09/25/2018   Arthritis of left acromioclavicular joint 09/23/2018   Precordial chest pain 08/11/2018   Morbid obesity (HCC)  10/31/2017   S/P right UKR, lateral 10/30/2017   Incarcerated epigastric hernia 05/30/2015   Left foot pain 08/09/2014   Chronic diarrhea 07/27/2014   Gout 08/10/2012   Acid reflux 01/22/2012   Down syndrome 11/11/2011   Hernia of abdominal wall 10/21/2011   Psoriasis 10/21/2011   Uncontrolled type 2 diabetes mellitus with hyperglycemia, with long-term current use of insulin  (HCC) 04/12/2011   Hypothyroid 04/12/2011   Gait abnormality 06/22/2010   Tibial tendinitis, posterior 06/22/2010    PCP: Copland MD  REFERRING PROVIDER: Curtis, MD  REFERRING DIAG: neck pain  THERAPY DIAG:  Cervicalgia  Cramp and spasm  Rationale for Evaluation and Treatment: Rehabilitation  ONSET DATE: 03/03/23  SUBJECTIVE:                                                                                                                                                                                                         SUBJECTIVE STATEMENT: Patient reports neck pain and right shoulder pain, she is unsure of when the pain started or why it started, she reports sleeping in the right side mostly.  X-rays negative, Hand dominance: Right  PERTINENT HISTORY:  See above  PAIN:  Are you having pain? Yes: NPRS scale: 5/10 Pain location: neck and right shoulder, some HA Pain description: sore, ache Aggravating factors: lying on the right side, dressing and doing hair pain can be up to 9/10 Relieving factors: heat, Aleve   pain can be 3/10  PRECAUTIONS: None  RED FLAGS: None     WEIGHT BEARING RESTRICTIONS: No  FALLS:  Has patient fallen in last 6 months? No  LIVING ENVIRONMENT: Lives with: lives alone Lives in: House/apartment Stairs: No Has following equipment at home: Single point cane  OCCUPATION: none  PLOF: Independent and school 3 x week, cooking shopping  PATIENT GOALS: have less pain, sleep better  NEXT MD VISIT: none  scheduled  OBJECTIVE:  Note: Objective measures  were completed at Evaluation unless otherwise noted.  DIAGNOSTIC FINDINGS:  IMPRESSION: No acute displaced fracture or traumatic listhesis of the cervical spine. Limited evaluation due to overlapping osseous structures and overlying soft tissues.    PATIENT SURVEYS:  NDI 30/50 = 60%  COGNITION: Overall cognitive status: Within functional limits for tasks assessed  SENSATION: WFL  POSTURE: rounded shoulders, forward head, decreased lumbar lordosis, and increased thoracic kyphosis  PALPATION: C/o some tenderness in the neck mms, the right upper trap and in the right shoulder and upper arm area   CERVICAL ROM:   Active ROM A/PROM (deg) eval  Flexion Decreased 25%  Extension Decreased 75%  Right lateral flexion Decreased 75%  Left lateral flexion Decreased 75%  Right rotation Decreased 50%  Left rotation Decreased 50%   (Blank rows = not tested)  UPPER EXTREMITY ROM:  Active ROM Right AROM eval Left eval  Shoulder flexion 150   Shoulder extension    Shoulder abduction 130   Shoulder adduction    Shoulder extension    Shoulder internal rotation 40   Shoulder external rotation 60   Elbow flexion    Elbow extension    Wrist flexion    Wrist extension    Wrist ulnar deviation    Wrist radial deviation    Wrist pronation    Wrist supination     (Blank rows = not tested)  UPPER EXTREMITY MMT:  MMT Right eval Left eval  Shoulder flexion 4- 4-  Shoulder extension    Shoulder abduction 3+ 4-  Shoulder adduction    Shoulder extension    Shoulder internal rotation 3+ 4  Shoulder external rotation 3+ 4  Middle trapezius    Lower trapezius    Elbow flexion    Elbow extension    Wrist flexion    Wrist extension    Wrist ulnar deviation    Wrist radial deviation    Wrist pronation    Wrist supination    Grip strength     (Blank rows = not tested)  CERVICAL SPECIAL TESTS:  None tested  FUNCTIONAL TESTS:    TREATMENT DATE:  03/13/23 Eval                                                                                                                           PATIENT EDUCATION:  Education details: POC Person educated: Patient and Parent Education method: Explanation Education comprehension: verbalized understanding  HOME EXERCISE PROGRAM: TBD  ASSESSMENT:  CLINICAL IMPRESSION: Patient is a 43 y.o. female who was seen today for physical therapy evaluation and treatment for neck pain, HA and right shoulder pain.  She is unsure of why.  She reports that she sleeps on the right side and she wakes up in pain, x-rays of the neck were negative.  She has a loss of ROM of the neck and the right shoulder.  MMs are tight and tender  OBJECTIVE IMPAIRMENTS: Abnormal gait, cardiopulmonary status limiting activity, decreased activity tolerance, decreased coordination, decreased endurance, decreased mobility, difficulty walking, decreased ROM, decreased strength, impaired perceived functional ability, increased muscle spasms, impaired flexibility, impaired UE functional use, improper body mechanics, postural dysfunction, and pain.   REHAB POTENTIAL: Good  CLINICAL DECISION MAKING: Stable/uncomplicated  EVALUATION COMPLEXITY: Low   GOALS: Goals reviewed with patient? Yes  SHORT TERM GOALS: Target date: 04/04/23  Independent with initial HEP Baseline:  Goal status: INITIAL  LONG TERM GOALS: Target date: 06/10/23  Independent with advanced HEP Baseline:  Goal status: INITIAL  2.  Understand posture and body mechanics Baseline:  Goal status: INITIAL  3.  Increase cervical ROM 25% Baseline:  Goal status: INITIAL  4.  Report able to do hair and get dressed without pain > 4/10 Baseline:  Goal status: INITIAL  5.  Be able to lift and carry her bags, purse, shopping bags with no pain Baseline:  Goal status: INITIAL   PLAN:  PT FREQUENCY: 1x/week  PT DURATION: 12 weeks  PLANNED INTERVENTIONS: 97164- PT Re-evaluation,  97110-Therapeutic exercises, 97530- Therapeutic activity, 97112- Neuromuscular re-education, 97535- Self Care, 02859- Manual therapy, 97014- Electrical stimulation (unattended), 97012- Traction (mechanical), Patient/Family education, Dry Needling, Joint mobilization, Cryotherapy, and Moist heat  PLAN FOR NEXT SESSION: start exercise, STM    Charbel Los W, PT 03/13/2023, 4:57 PM

## 2023-03-13 NOTE — Addendum Note (Signed)
 Addended by: Hollis Lurie on: 03/13/2023 05:28 PM   Modules accepted: Orders

## 2023-03-25 ENCOUNTER — Ambulatory Visit: Payer: Medicare HMO | Admitting: Physical Therapy

## 2023-03-25 DIAGNOSIS — E1165 Type 2 diabetes mellitus with hyperglycemia: Secondary | ICD-10-CM | POA: Diagnosis not present

## 2023-03-25 DIAGNOSIS — G8929 Other chronic pain: Secondary | ICD-10-CM | POA: Diagnosis not present

## 2023-03-25 DIAGNOSIS — M25641 Stiffness of right hand, not elsewhere classified: Secondary | ICD-10-CM | POA: Diagnosis not present

## 2023-03-25 DIAGNOSIS — R252 Cramp and spasm: Secondary | ICD-10-CM | POA: Diagnosis not present

## 2023-03-25 DIAGNOSIS — M542 Cervicalgia: Secondary | ICD-10-CM | POA: Diagnosis not present

## 2023-03-25 DIAGNOSIS — M25511 Pain in right shoulder: Secondary | ICD-10-CM

## 2023-03-25 NOTE — Therapy (Signed)
OUTPATIENT PHYSICAL THERAPY CERVICAL    Patient Name: Wanda Oneill MRN: [id number] DOB:1980-04-14, 43 y.o., female Today's Date: 03/25/2023  END OF SESSION:  PT End of Session - 03/25/23 1140     Visit Number 2    Date for PT Re-Evaluation 06/10/23    Authorization Type [employer]    PT Start Time 1140    PT Stop Time 1230    PT Time Calculation (min) 50 min             Past Medical History:  Diagnosis Date   Ambulates with cane    4 prong   Diabetes mellitus    type II   Down syndrome    Down's syndrome    Family history of adverse reaction to anesthesia    mom has had n/v   Gastritis    Headache    Hepatic steatosis    Hidradenitis    Hypothyroidism    Irritable bowel syndrome    Osteoarthritis    Periumbilical hernia    Pneumonia 03/2020   frequent    Restless legs    Sleep apnea    Tendonitis    chronic in left foot   Thyroid disease    hypothyroidism   Urinary incontinence 02/2022   some   UTI (urinary tract infection)    just dx 04/2022 - rx amoxicillin   Past Surgical History:  Procedure Laterality Date   AMPUTATION Right 04/11/2022   Procedure: RIGHT LONG FINGER AMPUTATION;  Surgeon: Betha Loa, MD;  Location: [facility] OR;  Service: Orthopedics;  Laterality: Right;   AXILLARY HIDRADENITIS EXCISION Bilateral    CHOLECYSTECTOMY     HERNIA REPAIR     multiple   INCISION AND DRAINAGE ABSCESS Right 02/06/2022   Procedure: INCISION AND DRAINAGE RIGHT HAND;  Surgeon: Betha Loa, MD;  Location: [facility] OR;  Service: Orthopedics;  Laterality: Right;   INCISION AND DRAINAGE WOUND WITH NAILBED REPAIR Left 02/06/2022   Procedure: INCISION AND DRAINAGE LEFT INDEX FINGER;  Surgeon: Betha Loa, MD;  Location: [facility] OR;  Service: Orthopedics;  Laterality: Left;   INCISIONAL HERNIA REPAIR N/A 05/30/2015   Procedure: LAPAROSCOPIC INCISIONAL HERNIA;  Surgeon: Emelia Loron, MD;  Location: [facility] OR;  Service: General;  Laterality: N/A;   INSERTION OF MESH N/A 05/30/2015    Procedure: INSERTION OF MESH;  Surgeon: Emelia Loron, MD;  Location: [facility] OR;  Service: General;  Laterality: N/A;   KNEE ARTHROSCOPY     right knee   LAPAROSCOPY N/A 05/30/2015   Procedure: LAPAROSCOPY DIAGNOSTIC;  Surgeon: Emelia Loron, MD;  Location: [facility] OR;  Service: General;  Laterality: N/A;   PARTIAL KNEE ARTHROPLASTY Right 10/30/2017   Procedure: UNICOMPARTMENTAL RIGHT KNEE LATERAL;  Surgeon: Durene Romans, MD;  Location: [facility];  Service: Orthopedics;  Laterality: Right;  90 mins   PARTIAL KNEE ARTHROPLASTY Left 06/29/2020   Procedure: UNICOMPARTMENTAL KNEE LATERALLY;  Surgeon: Durene Romans, MD;  Location: [facility];  Service: Orthopedics;  Laterality: Left;  70 MINS   TONSILLECTOMY     adenoids   Patient Active Problem List   Diagnosis Date Noted   OSA on CPAP 12/12/2022   DOE (dyspnea on exertion) 08/21/2022   Furuncles 02/08/2022   Infection of flexor tendon sheath 02/06/2022   Finger osteomyelitis, right (HCC) 02/06/2022   Leukocytosis 02/06/2022   History of COVID-19 02/06/2022   IBS (irritable bowel syndrome) 02/06/2022   Hypertriglyceridemia 02/06/2022   Obesity, Class III, BMI 40-49.9 (morbid obesity) (HCC) 02/06/2022   Lumbar facet  joint syndrome 11/02/2021   Bilateral trigger thumb 06/18/2021   Chronic paronychia of finger 01/25/2021   S/P left unicompartmental knee replacement, lateral 06/29/2020   At risk for obstructive sleep apnea 06/11/2019   Cough 06/11/2019   Hyperglycemia 05/26/2019   Acute hypoxemic respiratory failure (HCC) 05/20/2019   Fibromyalgia 03/09/2019   Neck pain, chronic 03/09/2019   Acute shoulder bursitis, right 02/03/2019   Bile salt-induced diarrhea 09/25/2018   Arthritis of left acromioclavicular joint 09/23/2018   Precordial chest pain 08/11/2018   Morbid obesity (HCC) 10/31/2017   S/P right UKR, lateral 10/30/2017   Incarcerated epigastric hernia 05/30/2015   Left foot pain 08/09/2014   Chronic diarrhea 07/27/2014   Gout  08/10/2012   Acid reflux 01/22/2012   Down syndrome 11/11/2011   Hernia of abdominal wall 10/21/2011   Psoriasis 10/21/2011   Uncontrolled type 2 diabetes mellitus with hyperglycemia, with long-term current use of insulin (HCC) 04/12/2011   Hypothyroid 04/12/2011   Gait abnormality 06/22/2010   Tibial tendinitis, posterior 06/22/2010    PCP: Copland MD  REFERRING PROVIDER: Benjamin Stain, MD  REFERRING DIAG: neck pain  THERAPY DIAG:  Cervicalgia  Cramp and spasm  Acute pain of right shoulder  Rationale for Evaluation and Treatment: Rehabilitation  ONSET DATE: 03/03/23  SUBJECTIVE:                                                                                                                                                                                                         SUBJECTIVE STATEMENT:   I hurt,very sore and tight Patient reports neck pain and right shoulder pain, she is unsure of when the pain started or why it started, she reports sleeping in the right side mostly.  X-rays negative, Hand dominance: Right  PERTINENT HISTORY:  See above  PAIN:  Are you having pain? Yes: NPRS scale: 5/10 Pain location: neck and right shoulder, some HA Pain description: sore, ache Aggravating factors: lying on the right side, dressing and doing hair pain can be up to 9/10 Relieving factors: heat, Aleve  pain can be 3/10  PRECAUTIONS: None  RED FLAGS: None     WEIGHT BEARING RESTRICTIONS: No  FALLS:  Has patient fallen in last 6 months? No  LIVING ENVIRONMENT: Lives with: lives alone Lives in: House/apartment Stairs: No Has following equipment at home: Single point cane  OCCUPATION: none  PLOF: Independent and school 3 x week, cooking shopping  PATIENT GOALS: have less pain, sleep better  NEXT MD VISIT: none scheduled  OBJECTIVE:  Note: Objective measures were completed at Evaluation  unless otherwise noted.  DIAGNOSTIC FINDINGS:  IMPRESSION: No acute  displaced fracture or traumatic listhesis of the cervical spine. Limited evaluation due to overlapping osseous structures and overlying soft tissues.    PATIENT SURVEYS:  NDI 30/50 = 60%  COGNITION: Overall cognitive status: Within functional limits for tasks assessed  SENSATION: WFL  POSTURE: rounded shoulders, forward head, decreased lumbar lordosis, and increased thoracic kyphosis  PALPATION: C/o some tenderness in the neck mms, the right upper trap and in the right shoulder and upper arm area   CERVICAL ROM:   Active ROM A/PROM (deg) eval  Flexion Decreased 25%  Extension Decreased 75%  Right lateral flexion Decreased 75%  Left lateral flexion Decreased 75%  Right rotation Decreased 50%  Left rotation Decreased 50%   (Blank rows = not tested)  UPPER EXTREMITY ROM:  Active ROM Right AROM eval Left eval  Shoulder flexion 150   Shoulder extension    Shoulder abduction 130   Shoulder adduction    Shoulder extension    Shoulder internal rotation 40   Shoulder external rotation 60   Elbow flexion    Elbow extension    Wrist flexion    Wrist extension    Wrist ulnar deviation    Wrist radial deviation    Wrist pronation    Wrist supination     (Blank rows = not tested)  UPPER EXTREMITY MMT:  MMT Right eval Left eval  Shoulder flexion 4- 4-  Shoulder extension    Shoulder abduction 3+ 4-  Shoulder adduction    Shoulder extension    Shoulder internal rotation 3+ 4  Shoulder external rotation 3+ 4  Middle trapezius    Lower trapezius    Elbow flexion    Elbow extension    Wrist flexion    Wrist extension    Wrist ulnar deviation    Wrist radial deviation    Wrist pronation    Wrist supination    Grip strength     (Blank rows = not tested)  CERVICAL SPECIAL TESTS:  None tested  FUNCTIONAL TESTS:    TREATMENT DATE:   03/25/23 STW  and stretching to RT cerv and shld Theragun to RT cerv/trap area Cerv retraction 15 x 3 sec hold Seated  red tband row,shld ext and ER- cued for posture as she tends to hold head in fwd flex and off to left Standing 3# shruggs,backward shld rolls and  shld ext 10 xeach MH IFC RT cerv/shld in supine  03/13/23 Eval                                                                                                                          PATIENT EDUCATION:  Education details: POC Person educated: Patient and Parent Education method: Explanation Education comprehension: verbalized understanding  HOME EXERCISE PROGRAM: TBD  ASSESSMENT:  CLINICAL IMPRESSION:  Initiated postural ex with max cuing as she has fwd flexed head and tilts to left. Tolerated ex fair with  cuing. STW to RT cerv, trap and shld with stretching, tightness and TP in RT trap. MH/estim for pain    Patient is a 43 y.o. female who was seen today for physical therapy evaluation and treatment for neck pain, HA and right shoulder pain.  She is unsure of why.  She reports that she sleeps on the right side and she wakes up in pain, x-rays of the neck were negative.  She has a loss of ROM of the neck and the right shoulder.  MMs are tight and tender   OBJECTIVE IMPAIRMENTS: Abnormal gait, cardiopulmonary status limiting activity, decreased activity tolerance, decreased coordination, decreased endurance, decreased mobility, difficulty walking, decreased ROM, decreased strength, impaired perceived functional ability, increased muscle spasms, impaired flexibility, impaired UE functional use, improper body mechanics, postural dysfunction, and pain.   REHAB POTENTIAL: Good  CLINICAL DECISION MAKING: Stable/uncomplicated  EVALUATION COMPLEXITY: Low   GOALS: Goals reviewed with patient? Yes  SHORT TERM GOALS: Target date: 04/04/23  Independent with initial HEP Baseline:  Goal status: INITIAL  LONG TERM GOALS: Target date: 06/10/23  Independent with advanced HEP Baseline:  Goal status: INITIAL  2.  Understand posture and body  mechanics Baseline:  Goal status: INITIAL  3.  Increase cervical ROM 25% Baseline:  Goal status: INITIAL  4.  Report able to do hair and get dressed without pain > 4/10 Baseline:  Goal status: INITIAL  5.  Be able to lift and carry her bags, purse, shopping bags with no pain Baseline:  Goal status: INITIAL   PLAN:  PT FREQUENCY: 1x/week  PT DURATION: 12 weeks  PLANNED INTERVENTIONS: 97164- PT Re-evaluation, 97110-Therapeutic exercises, 97530- Therapeutic activity, 97112- Neuromuscular re-education, 97535- Self Care, 65784- Manual therapy, 97014- Electrical stimulation (unattended), 97012- Traction (mechanical), Patient/Family education, Dry Needling, Joint mobilization, Cryotherapy, and Moist heat  PLAN FOR NEXT SESSION: start exercise, STM    Macgregor Aeschliman,ANGIE, PTA 03/25/2023, 11:41 AM     [facility] [address] [facility] [place], [place], [address] Phone: [phone]   Fax:  [phone]  Patient Details  Name: Wanda Oneill MRN: [id number] Date of Birth: 1980/10/26 Referring Provider:  Pearline Cables, MD  Encounter Date: 03/25/2023   Suanne Marker, PTA 03/25/2023, 11:41 AM  [facility] at [facility] [address] [facility] [place], [place], [address] Phone: [phone]   Fax:  [phone]

## 2023-04-01 ENCOUNTER — Ambulatory Visit: Payer: Medicare HMO | Admitting: Physical Therapy

## 2023-04-01 DIAGNOSIS — G8929 Other chronic pain: Secondary | ICD-10-CM | POA: Diagnosis not present

## 2023-04-01 DIAGNOSIS — M25511 Pain in right shoulder: Secondary | ICD-10-CM

## 2023-04-01 DIAGNOSIS — M542 Cervicalgia: Secondary | ICD-10-CM

## 2023-04-01 DIAGNOSIS — R252 Cramp and spasm: Secondary | ICD-10-CM

## 2023-04-01 DIAGNOSIS — M25641 Stiffness of right hand, not elsewhere classified: Secondary | ICD-10-CM

## 2023-04-01 NOTE — Therapy (Signed)
 OUTPATIENT PHYSICAL THERAPY CERVICAL    Patient Name: Wanda Oneill MRN: [id number] DOB:1980/07/16, 43 y.o., female Today's Date: 04/01/2023  END OF SESSION:  PT End of Session - 04/01/23 1444     Visit Number 3    Number of Visits 13    Date for PT Re-Evaluation 06/10/23    Authorization Type [employer]    PT Start Time 1445    PT Stop Time 1535    PT Time Calculation (min) 50 min             Past Medical History:  Diagnosis Date   Ambulates with cane    4 prong   Diabetes mellitus    type II   Down syndrome    Down's syndrome    Family history of adverse reaction to anesthesia    mom has had n/v   Gastritis    Headache    Hepatic steatosis    Hidradenitis    Hypothyroidism    Irritable bowel syndrome    Osteoarthritis    Periumbilical hernia    Pneumonia 03/2020   frequent    Restless legs    Sleep apnea    Tendonitis    chronic in left foot   Thyroid disease    hypothyroidism   Urinary incontinence 02/2022   some   UTI (urinary tract infection)    just dx 04/2022 - rx amoxicillin   Past Surgical History:  Procedure Laterality Date   AMPUTATION Right 04/11/2022   Procedure: RIGHT LONG FINGER AMPUTATION;  Surgeon: Betha Loa, MD;  Location: [facility] OR;  Service: Orthopedics;  Laterality: Right;   AXILLARY HIDRADENITIS EXCISION Bilateral    CHOLECYSTECTOMY     HERNIA REPAIR     multiple   INCISION AND DRAINAGE ABSCESS Right 02/06/2022   Procedure: INCISION AND DRAINAGE RIGHT HAND;  Surgeon: Betha Loa, MD;  Location: [facility] OR;  Service: Orthopedics;  Laterality: Right;   INCISION AND DRAINAGE WOUND WITH NAILBED REPAIR Left 02/06/2022   Procedure: INCISION AND DRAINAGE LEFT INDEX FINGER;  Surgeon: Betha Loa, MD;  Location: [facility] OR;  Service: Orthopedics;  Laterality: Left;   INCISIONAL HERNIA REPAIR N/A 05/30/2015   Procedure: LAPAROSCOPIC INCISIONAL HERNIA;  Surgeon: Emelia Loron, MD;  Location: [facility] OR;  Service: General;  Laterality: N/A;   INSERTION  OF MESH N/A 05/30/2015   Procedure: INSERTION OF MESH;  Surgeon: Emelia Loron, MD;  Location: [facility] OR;  Service: General;  Laterality: N/A;   KNEE ARTHROSCOPY     right knee   LAPAROSCOPY N/A 05/30/2015   Procedure: LAPAROSCOPY DIAGNOSTIC;  Surgeon: Emelia Loron, MD;  Location: [facility] OR;  Service: General;  Laterality: N/A;   PARTIAL KNEE ARTHROPLASTY Right 10/30/2017   Procedure: UNICOMPARTMENTAL RIGHT KNEE LATERAL;  Surgeon: Durene Romans, MD;  Location: [facility] ORS;  Service: Orthopedics;  Laterality: Right;  90 mins   PARTIAL KNEE ARTHROPLASTY Left 06/29/2020   Procedure: UNICOMPARTMENTAL KNEE LATERALLY;  Surgeon: Durene Romans, MD;  Location: [facility] OR[facility];  Service: Orthopedics;  Laterality: Left;  70 MINS   TONSILLECTOMY     adenoids   Patient Active Problem List   Diagnosis Date Noted   OSA on CPAP 12/12/2022   DOE (dyspnea on exertion) 08/21/2022   Furuncles 02/08/2022   Infection of flexor tendon sheath 02/06/2022   Finger osteomyelitis, right (HCC) 02/06/2022   Leukocytosis 02/06/2022   History of COVID-19 02/06/2022   IBS (irritable bowel syndrome) 02/06/2022   Hypertriglyceridemia 02/06/2022   Obesity, Class III, BMI 40-49.9 (morbid  obesity) (HCC) 02/06/2022   Lumbar facet joint syndrome 11/02/2021   Bilateral trigger thumb 06/18/2021   Chronic paronychia of finger 01/25/2021   S/P left unicompartmental knee replacement, lateral 06/29/2020   At risk for obstructive sleep apnea 06/11/2019   Cough 06/11/2019   Hyperglycemia 05/26/2019   Acute hypoxemic respiratory failure (HCC) 05/20/2019   Fibromyalgia 03/09/2019   Neck pain, chronic 03/09/2019   Acute shoulder bursitis, right 02/03/2019   Bile salt-induced diarrhea 09/25/2018   Arthritis of left acromioclavicular joint 09/23/2018   Precordial chest pain 08/11/2018   Morbid obesity (HCC) 10/31/2017   S/P right UKR, lateral 10/30/2017   Incarcerated epigastric hernia 05/30/2015   Left foot pain 08/09/2014   Chronic  diarrhea 07/27/2014   Gout 08/10/2012   Acid reflux 01/22/2012   Down syndrome 11/11/2011   Hernia of abdominal wall 10/21/2011   Psoriasis 10/21/2011   Uncontrolled type 2 diabetes mellitus with hyperglycemia, with long-term current use of insulin (HCC) 04/12/2011   Hypothyroid 04/12/2011   Gait abnormality 06/22/2010   Tibial tendinitis, posterior 06/22/2010    PCP: Copland MD  REFERRING PROVIDER: Benjamin Stain, MD  REFERRING DIAG: neck pain  THERAPY DIAG:  Cervicalgia  Cramp and spasm  Acute pain of right shoulder  Stiffness of right hand, not elsewhere classified  Rationale for Evaluation and Treatment: Rehabilitation  ONSET DATE: 03/03/23  SUBJECTIVE:                                                                                                                                                                                                         SUBJECTIVE STATEMENT:   Still hurt a bit better   Patient reports neck pain and right shoulder pain, she is unsure of when the pain started or why it started, she reports sleeping in the right side mostly.  X-rays negative, Hand dominance: Right  PERTINENT HISTORY:  See above  PAIN:  Are you having pain? Yes: NPRS scale: 5/10 Pain location: neck and right shoulder, some HA Pain description: sore, ache Aggravating factors: lying on the right side, dressing and doing hair pain can be up to 9/10 Relieving factors: heat, Aleve  pain can be 3/10  PRECAUTIONS: None  RED FLAGS: None     WEIGHT BEARING RESTRICTIONS: No  FALLS:  Has patient fallen in last 6 months? No  LIVING ENVIRONMENT: Lives with: lives alone Lives in: House/apartment Stairs: No Has following equipment at home: Single point cane  OCCUPATION: none  PLOF: Independent and school 3 x week, cooking shopping  PATIENT GOALS: have less pain, sleep  better  NEXT MD VISIT: none scheduled  OBJECTIVE:  Note: Objective measures were completed at  Evaluation unless otherwise noted.  DIAGNOSTIC FINDINGS:  IMPRESSION: No acute displaced fracture or traumatic listhesis of the cervical spine. Limited evaluation due to overlapping osseous structures and overlying soft tissues.    PATIENT SURVEYS:  NDI 30/50 = 60%  COGNITION: Overall cognitive status: Within functional limits for tasks assessed  SENSATION: WFL  POSTURE: rounded shoulders, forward head, decreased lumbar lordosis, and increased thoracic kyphosis  PALPATION: C/o some tenderness in the neck mms, the right upper trap and in the right shoulder and upper arm area   CERVICAL ROM:   Active ROM A/PROM (deg) eval  Flexion Decreased 25%  Extension Decreased 75%  Right lateral flexion Decreased 75%  Left lateral flexion Decreased 75%  Right rotation Decreased 50%  Left rotation Decreased 50%   (Blank rows = not tested)  UPPER EXTREMITY ROM:  Active ROM Right AROM eval Left eval  Shoulder flexion 150   Shoulder extension    Shoulder abduction 130   Shoulder adduction    Shoulder extension    Shoulder internal rotation 40   Shoulder external rotation 60   Elbow flexion    Elbow extension    Wrist flexion    Wrist extension    Wrist ulnar deviation    Wrist radial deviation    Wrist pronation    Wrist supination     (Blank rows = not tested)  UPPER EXTREMITY MMT:  MMT Right eval Left eval  Shoulder flexion 4- 4-  Shoulder extension    Shoulder abduction 3+ 4-  Shoulder adduction    Shoulder extension    Shoulder internal rotation 3+ 4  Shoulder external rotation 3+ 4  Middle trapezius    Lower trapezius    Elbow flexion    Elbow extension    Wrist flexion    Wrist extension    Wrist ulnar deviation    Wrist radial deviation    Wrist pronation    Wrist supination    Grip strength     (Blank rows = not tested)  CERVICAL SPECIAL TESTS:  None tested  FUNCTIONAL TESTS:    TREATMENT DATE:   04/01/23 Seated shld row and shld ext  red tband 2 sets 10 with postural cuing  Seated red tband shld diagnols 10 x Cerv retraction 15 x with head on ball on wall hold 3 sec Wall push ups with ball 10 x 5# standing shrugs ,backward rolls and shld ext 15 x each STW  and stretching to RT cerv and shld Theragun to RT cerv/trap area MH IFC RT cerv/shld in supine    03/25/23 STW  and stretching to RT cerv and shld Theragun to RT cerv/trap area Cerv retraction 15 x 3 sec hold Seated red tband row,shld ext and ER- cued for posture as she tends to hold head in fwd flex and off to left Standing 3# shruggs,backward shld rolls and  shld ext 10 xeach MH IFC RT cerv/shld in supine  03/13/23 Eval  PATIENT EDUCATION:  Education details: POC Person educated: Patient and Parent Education method: Explanation Education comprehension: verbalized understanding  HOME EXERCISE PROGRAM: Access Code: [email or web address] URL: [email or web address] Date: 04/01/2023 Prepared by: Wanda Oneill  Exercises - Seated Cervical Retraction  - 3 x daily - 7 x weekly - 1 sets - 10 reps - 3 hold - Seated Scapular Retraction  - 3 x daily - 7 x weekly - 1 sets - 10 reps - 3 hold - Seated Shoulder Shrug  - 3 x daily - 7 x weekly - 1 sets - 10 reps - 3 hold - Seated Shoulder Shrug Circles AROM Backward  - 3 x daily - 7 x weekly - 1 sets - 10 reps - 3 hold - Doorway Pec Stretch at 60 Elevation  - 3 x daily - 7 x weekly - 1 sets - 10 reps - 3-5 at various angles hold  ASSESSMENT:  CLINICAL IMPRESSION:  progressed postural ex with mod cuing as she has fwd flexed head and tilts to left. Tolerated ex well with cuing. STW to RT cerv, trap and shld with stretching, tightness and TP in RT trap. MH/estim for pain. Performed and Issued HEP . STG met    Patient is a 43 y.o. female who was seen today for physical therapy evaluation and  treatment for neck pain, HA and right shoulder pain.  She is unsure of why.  She reports that she sleeps on the right side and she wakes up in pain, x-rays of the neck were negative.  She has a loss of ROM of the neck and the right shoulder.  MMs are tight and tender   OBJECTIVE IMPAIRMENTS: Abnormal gait, cardiopulmonary status limiting activity, decreased activity tolerance, decreased coordination, decreased endurance, decreased mobility, difficulty walking, decreased ROM, decreased strength, impaired perceived functional ability, increased muscle spasms, impaired flexibility, impaired UE functional use, improper body mechanics, postural dysfunction, and pain.   REHAB POTENTIAL: Good  CLINICAL DECISION MAKING: Stable/uncomplicated  EVALUATION COMPLEXITY: Low   GOALS: Goals reviewed with patient? Yes  SHORT TERM GOALS: Target date: 04/04/23  Independent with initial HEP Baseline:  Goal status: 04/01/23 MET  LONG TERM GOALS: Target date: 06/10/23  Independent with advanced HEP Baseline:  Goal status: INITIAL  2.  Understand posture and body mechanics Baseline:  Goal status: INITIAL  3.  Increase cervical ROM 25% Baseline:  Goal status: INITIAL  4.  Report able to do hair and get dressed without pain > 4/10 Baseline:  Goal status: INITIAL  5.  Be able to lift and carry her bags, purse, shopping bags with no pain Baseline:  Goal status: INITIAL   PLAN:  PT FREQUENCY: 1x/week  PT DURATION: 12 weeks  PLANNED INTERVENTIONS: 97164- PT Re-evaluation, 97110-Therapeutic exercises, 97530- Therapeutic activity, 97112- Neuromuscular re-education, 97535- Self Care, 40981- Manual therapy, 97014- Electrical stimulation (unattended), 97012- Traction (mechanical), Patient/Family education, Dry Needling, Joint mobilization, Cryotherapy, and Moist heat  PLAN FOR NEXT SESSION: assess HEP and progress   Wanda Oneill,ANGIE, PTA 04/01/2023, 3:29 PM     [facility] at [facility] [address] [facility] [place], [place], [address] Phone: [phone]   Fax:  [phone]  Patient Details  Name: Wanda Oneill MRN: [id number] Date of Birth: 1980-07-11 Referring Provider:  Pearline Cables, MD  Encounter Date: 04/01/2023   Wanda Oneill, PTA 04/01/2023, 3:29 PM  [place] [facility] at [facility] [address] [facility] [address] [facility] [place], [place], [address] Phone: [phone]   Fax:  [phone] at [facility] [address] [place], [place], [address] Phone: [phone]   Fax:  [phone]  Patient Details  Name: Wanda Oneill MRN: [id number] Date of Birth: December 07, 1980 Referring Provider:  Pearline Cables, MD  Encounter Date: 04/01/2023   Wanda Oneill, PTA 04/01/2023, 3:29 PM  [place] [facility] at [facility] [address] [place], [place], [address] Phone: [phone]   Fax:  [phone]

## 2023-04-08 ENCOUNTER — Encounter: Payer: Self-pay | Admitting: Physical Therapy

## 2023-04-08 ENCOUNTER — Ambulatory Visit: Payer: Medicare HMO | Attending: Sports Medicine | Admitting: Physical Therapy

## 2023-04-08 DIAGNOSIS — M25641 Stiffness of right hand, not elsewhere classified: Secondary | ICD-10-CM | POA: Diagnosis present

## 2023-04-08 DIAGNOSIS — M542 Cervicalgia: Secondary | ICD-10-CM | POA: Diagnosis present

## 2023-04-08 DIAGNOSIS — M25511 Pain in right shoulder: Secondary | ICD-10-CM | POA: Diagnosis present

## 2023-04-08 DIAGNOSIS — R252 Cramp and spasm: Secondary | ICD-10-CM | POA: Insufficient documentation

## 2023-04-08 DIAGNOSIS — M6281 Muscle weakness (generalized): Secondary | ICD-10-CM | POA: Diagnosis present

## 2023-04-08 NOTE — Therapy (Signed)
 OUTPATIENT PHYSICAL THERAPY CERVICAL    Patient Name: Wanda Oneill MRN: [id number] DOB:14-May-1980, 43 y.o., female Today's Date: 04/08/2023  END OF SESSION:  PT End of Session - 04/08/23 1524     Visit Number 4    Number of Visits 13    Date for PT Re-Evaluation 06/10/23    Authorization Type [employer]    PT Start Time 1524    PT Stop Time 1613    PT Time Calculation (min) 49 min    Activity Tolerance Patient tolerated treatment well    Behavior During Therapy WFL for tasks assessed/performed             Past Medical History:  Diagnosis Date   Ambulates with cane    4 prong   Diabetes mellitus    type II   Down syndrome    Down's syndrome    Family history of adverse reaction to anesthesia    mom has had n/v   Gastritis    Headache    Hepatic steatosis    Hidradenitis    Hypothyroidism    Irritable bowel syndrome    Osteoarthritis    Periumbilical hernia    Pneumonia 03/2020   frequent    Restless legs    Sleep apnea    Tendonitis    chronic in left foot   Thyroid disease    hypothyroidism   Urinary incontinence 02/2022   some   UTI (urinary tract infection)    just dx 04/2022 - rx amoxicillin   Past Surgical History:  Procedure Laterality Date   AMPUTATION Right 04/11/2022   Procedure: RIGHT LONG FINGER AMPUTATION;  Surgeon: Betha Loa, MD;  Location: [facility] OR;  Service: Orthopedics;  Laterality: Right;   AXILLARY HIDRADENITIS EXCISION Bilateral    CHOLECYSTECTOMY     HERNIA REPAIR     multiple   INCISION AND DRAINAGE ABSCESS Right 02/06/2022   Procedure: INCISION AND DRAINAGE RIGHT HAND;  Surgeon: Betha Loa, MD;  Location: [facility] OR;  Service: Orthopedics;  Laterality: Right;   INCISION AND DRAINAGE WOUND WITH NAILBED REPAIR Left 02/06/2022   Procedure: INCISION AND DRAINAGE LEFT INDEX FINGER;  Surgeon: Betha Loa, MD;  Location: [facility] OR;  Service: Orthopedics;  Laterality: Left;   INCISIONAL HERNIA REPAIR N/A 05/30/2015   Procedure: LAPAROSCOPIC  INCISIONAL HERNIA;  Surgeon: Emelia Loron, MD;  Location: [facility] OR;  Service: General;  Laterality: N/A;   INSERTION OF MESH N/A 05/30/2015   Procedure: INSERTION OF MESH;  Surgeon: Emelia Loron, MD;  Location: [facility] OR;  Service: General;  Laterality: N/A;   KNEE ARTHROSCOPY     right knee   LAPAROSCOPY N/A 05/30/2015   Procedure: LAPAROSCOPY DIAGNOSTIC;  Surgeon: Emelia Loron, MD;  Location: [facility] OR;  Service: General;  Laterality: N/A;   PARTIAL KNEE ARTHROPLASTY Right 10/30/2017   Procedure: UNICOMPARTMENTAL RIGHT KNEE LATERAL;  Surgeon: Durene Romans, MD;  Location: [facility] ORS;  Service: Orthopedics;  Laterality: Right;  90 mins   PARTIAL KNEE ARTHROPLASTY Left 06/29/2020   Procedure: UNICOMPARTMENTAL KNEE LATERALLY;  Surgeon: Durene Romans, MD;  Location: [facility] OR[facility];  Service: Orthopedics;  Laterality: Left;  70 MINS   TONSILLECTOMY     adenoids   Patient Active Problem List   Diagnosis Date Noted   OSA on CPAP 12/12/2022   DOE (dyspnea on exertion) 08/21/2022   Furuncles 02/08/2022   Infection of flexor tendon sheath 02/06/2022   Finger osteomyelitis, right (HCC) 02/06/2022   Leukocytosis 02/06/2022   History of COVID-19 02/06/2022  IBS (irritable bowel syndrome) 02/06/2022   Hypertriglyceridemia 02/06/2022   Obesity, Class III, BMI 40-49.9 (morbid obesity) (HCC) 02/06/2022   Lumbar facet joint syndrome 11/02/2021   Bilateral trigger thumb 06/18/2021   Chronic paronychia of finger 01/25/2021   S/P left unicompartmental knee replacement, lateral 06/29/2020   At risk for obstructive sleep apnea 06/11/2019   Cough 06/11/2019   Hyperglycemia 05/26/2019   Acute hypoxemic respiratory failure (HCC) 05/20/2019   Fibromyalgia 03/09/2019   Neck pain, chronic 03/09/2019   Acute shoulder bursitis, right 02/03/2019   Bile salt-induced diarrhea 09/25/2018   Arthritis of left acromioclavicular joint 09/23/2018   Precordial chest pain 08/11/2018   Morbid obesity (HCC) 10/31/2017   S/P  right UKR, lateral 10/30/2017   Incarcerated epigastric hernia 05/30/2015   Left foot pain 08/09/2014   Chronic diarrhea 07/27/2014   Gout 08/10/2012   Acid reflux 01/22/2012   Down syndrome 11/11/2011   Hernia of abdominal wall 10/21/2011   Psoriasis 10/21/2011   Uncontrolled type 2 diabetes mellitus with hyperglycemia, with long-term current use of insulin (HCC) 04/12/2011   Hypothyroid 04/12/2011   Gait abnormality 06/22/2010   Tibial tendinitis, posterior 06/22/2010    PCP: Copland MD  REFERRING PROVIDER: Benjamin Stain, MD  REFERRING DIAG: neck pain  THERAPY DIAG:  Cervicalgia  Cramp and spasm  Acute pain of right shoulder  Rationale for Evaluation and Treatment: Rehabilitation  ONSET DATE: 03/03/23  SUBJECTIVE:                                                                                                                                                                                                         SUBJECTIVE STATEMENT:  Reports feeling a little better, still hurting at times   Patient reports neck pain and right shoulder pain, she is unsure of when the pain started or why it started, she reports sleeping in the right side mostly.  X-rays negative, Hand dominance: Right  PERTINENT HISTORY:  See above  PAIN:  Are you having pain? Yes: NPRS scale: 4/10 Pain location: neck and right shoulder, some HA Pain description: sore, ache Aggravating factors: lying on the right side, dressing and doing hair pain can be up to 9/10 Relieving factors: heat, Aleve  pain can be 3/10  PRECAUTIONS: None  RED FLAGS: None     WEIGHT BEARING RESTRICTIONS: No  FALLS:  Has patient fallen in last 6 months? No  LIVING ENVIRONMENT: Lives with: lives alone Lives in: House/apartment Stairs: No Has following equipment at home: Single point cane  OCCUPATION: none  PLOF: Independent and school  3 x week, cooking shopping  PATIENT GOALS: have less pain, sleep  better  NEXT MD VISIT: none scheduled  OBJECTIVE:  Note: Objective measures were completed at Evaluation unless otherwise noted.  DIAGNOSTIC FINDINGS:  IMPRESSION: No acute displaced fracture or traumatic listhesis of the cervical spine. Limited evaluation due to overlapping osseous structures and overlying soft tissues.    PATIENT SURVEYS:  NDI 30/50 = 60%  COGNITION: Overall cognitive status: Within functional limits for tasks assessed  SENSATION: WFL  POSTURE: rounded shoulders, forward head, decreased lumbar lordosis, and increased thoracic kyphosis  PALPATION: C/o some tenderness in the neck mms, the right upper trap and in the right shoulder and upper arm area   CERVICAL ROM:   Active ROM A/PROM (deg) eval  Flexion Decreased 25%  Extension Decreased 75%  Right lateral flexion Decreased 75%  Left lateral flexion Decreased 75%  Right rotation Decreased 50%  Left rotation Decreased 50%   (Blank rows = not tested)  UPPER EXTREMITY ROM:  Active ROM Right AROM eval Left eval  Shoulder flexion 150   Shoulder extension    Shoulder abduction 130   Shoulder adduction    Shoulder extension    Shoulder internal rotation 40   Shoulder external rotation 60   Elbow flexion    Elbow extension    Wrist flexion    Wrist extension    Wrist ulnar deviation    Wrist radial deviation    Wrist pronation    Wrist supination     (Blank rows = not tested)  UPPER EXTREMITY MMT:  MMT Right eval Left eval  Shoulder flexion 4- 4-  Shoulder extension    Shoulder abduction 3+ 4-  Shoulder adduction    Shoulder extension    Shoulder internal rotation 3+ 4  Shoulder external rotation 3+ 4  Middle trapezius    Lower trapezius    Elbow flexion    Elbow extension    Wrist flexion    Wrist extension    Wrist ulnar deviation    Wrist radial deviation    Wrist pronation    Wrist supination    Grip strength     (Blank rows = not tested)  CERVICAL SPECIAL TESTS:   None tested  FUNCTIONAL TESTS:    TREATMENT DATE:  04/08/23 Nustep level 5 x 6 minutes Seated red tband Row Red tband extension Red tband ER Weighted ball overhead lifts 2 # shrugs Upper trap and levator stretches Cervical retraction STM to the right upper trap, rhomboid and neck with the Tgun  04/01/23 Seated shld row and shld ext red tband 2 sets 10 with postural cuing  Seated red tband shld diagnols 10 x Cerv retraction 15 x with head on ball on wall hold 3 sec Wall push ups with ball 10 x 5# standing shrugs ,backward rolls and shld ext 15 x each STW  and stretching to RT cerv and shld Theragun to RT cerv/trap area MH IFC RT cerv/shld in supine    03/25/23 STW  and stretching to RT cerv and shld Theragun to RT cerv/trap area Cerv retraction 15 x 3 sec hold Seated red tband row,shld ext and ER- cued for posture as she tends to hold head in fwd flex and off to left Standing 3# shruggs,backward shld rolls and  shld ext 10 xeach MH IFC RT cerv/shld in supine  03/13/23 Eval  PATIENT EDUCATION:  Education details: POC Person educated: Patient and Parent Education method: Explanation Education comprehension: verbalized understanding  HOME EXERCISE PROGRAM: Access Code: [email or web address] URL: [email or web address] Date: 04/01/2023 Prepared by: Angela Payseur  Exercises - Seated Cervical Retraction  - 3 x daily - 7 x weekly - 1 sets - 10 reps - 3 hold - Seated Scapular Retraction  - 3 x daily - 7 x weekly - 1 sets - 10 reps - 3 hold - Seated Shoulder Shrug  - 3 x daily - 7 x weekly - 1 sets - 10 reps - 3 hold - Seated Shoulder Shrug Circles AROM Backward  - 3 x daily - 7 x weekly - 1 sets - 10 reps - 3 hold - Doorway Pec Stretch at 60 Elevation  - 3 x daily - 7 x weekly - 1 sets - 10 reps - 3-5 at various angles hold  ASSESSMENT:  CLINICAL  IMPRESSION:  Continue to advance exercises as she tolerates, she needs a lot of cues for posture and form of exercises, a lot of times she really cannot get in good posture due to fwd head and rounded shoulder posture.  C/O fatigue with exercises but no increase of pain, continues to report the STM feels good  Patient is a 43 y.o. female who was seen today for physical therapy evaluation and treatment for neck pain, HA and right shoulder pain.  She is unsure of why.  She reports that she sleeps on the right side and she wakes up in pain, x-rays of the neck were negative.  She has a loss of ROM of the neck and the right shoulder.  MMs are tight and tender   OBJECTIVE IMPAIRMENTS: Abnormal gait, cardiopulmonary status limiting activity, decreased activity tolerance, decreased coordination, decreased endurance, decreased mobility, difficulty walking, decreased ROM, decreased strength, impaired perceived functional ability, increased muscle spasms, impaired flexibility, impaired UE functional use, improper body mechanics, postural dysfunction, and pain.   REHAB POTENTIAL: Good  CLINICAL DECISION MAKING: Stable/uncomplicated  EVALUATION COMPLEXITY: Low   GOALS: Goals reviewed with patient? Yes  SHORT TERM GOALS: Target date: 04/04/23  Independent with initial HEP Baseline:  Goal status: 04/01/23 MET  LONG TERM GOALS: Target date: 06/10/23  Independent with advanced HEP Baseline:  Goal status: INITIAL  2.  Understand posture and body mechanics Baseline:  Goal status: ongoing 04/08/23  3.  Increase cervical ROM 25% Baseline:  Goal status: INITIAL  4.  Report able to do hair and get dressed without pain > 4/10 Baseline:  Goal status:progressing 04/08/23  5.  Be able to lift and carry her bags, purse, shopping bags with no pain Baseline:  Goal status: INITIAL   PLAN:  PT FREQUENCY: 1x/week  PT DURATION: 12 weeks  PLANNED INTERVENTIONS: 97164- PT Re-evaluation, 97110-Therapeutic  exercises, 97530- Therapeutic activity, 97112- Neuromuscular re-education, 97535- Self Care, 24401- Manual therapy, 97014- Electrical stimulation (unattended), 97012- Traction (mechanical), Patient/Family education, Dry Needling, Joint mobilization, Cryotherapy, and Moist heat  PLAN FOR NEXT SESSION: continue to progress as tolerated   Bettylou Frew W, PT 04/08/2023, 3:25 PM

## 2023-04-10 DIAGNOSIS — G4733 Obstructive sleep apnea (adult) (pediatric): Secondary | ICD-10-CM | POA: Diagnosis not present

## 2023-04-15 ENCOUNTER — Ambulatory Visit: Payer: Medicare HMO | Admitting: Physical Therapy

## 2023-04-15 ENCOUNTER — Encounter: Payer: Self-pay | Admitting: Family Medicine

## 2023-04-15 ENCOUNTER — Encounter: Payer: Self-pay | Admitting: Physical Therapy

## 2023-04-15 DIAGNOSIS — M6281 Muscle weakness (generalized): Secondary | ICD-10-CM | POA: Diagnosis not present

## 2023-04-15 DIAGNOSIS — M25511 Pain in right shoulder: Secondary | ICD-10-CM

## 2023-04-15 DIAGNOSIS — R252 Cramp and spasm: Secondary | ICD-10-CM | POA: Diagnosis not present

## 2023-04-15 DIAGNOSIS — M25641 Stiffness of right hand, not elsewhere classified: Secondary | ICD-10-CM | POA: Diagnosis not present

## 2023-04-15 DIAGNOSIS — M542 Cervicalgia: Secondary | ICD-10-CM | POA: Diagnosis not present

## 2023-04-15 NOTE — Therapy (Signed)
 OUTPATIENT PHYSICAL THERAPY CERVICAL    Patient Name: Wanda Oneill MRN: [id number] DOB:12-Dec-1980, 43 y.o., female Today's Date: 04/15/2023  END OF SESSION:  PT End of Session - 04/15/23 1533     Visit Number 5    Number of Visits 13    Date for PT Re-Evaluation 06/10/23    Authorization Type [employer]    PT Start Time 1530    PT Stop Time 1615    PT Time Calculation (min) 45 min    Activity Tolerance Patient tolerated treatment well    Behavior During Therapy WFL for tasks assessed/performed             Past Medical History:  Diagnosis Date   Ambulates with cane    4 prong   Diabetes mellitus    type II   Down syndrome    Down's syndrome    Family history of adverse reaction to anesthesia    mom has had n/v   Gastritis    Headache    Hepatic steatosis    Hidradenitis    Hypothyroidism    Irritable bowel syndrome    Osteoarthritis    Periumbilical hernia    Pneumonia 03/2020   frequent    Restless legs    Sleep apnea    Tendonitis    chronic in left foot   Thyroid disease    hypothyroidism   Urinary incontinence 02/2022   some   UTI (urinary tract infection)    just dx 04/2022 - rx amoxicillin   Past Surgical History:  Procedure Laterality Date   AMPUTATION Right 04/11/2022   Procedure: RIGHT LONG FINGER AMPUTATION;  Surgeon: Betha Loa, MD;  Location: [facility] OR;  Service: Orthopedics;  Laterality: Right;   AXILLARY HIDRADENITIS EXCISION Bilateral    CHOLECYSTECTOMY     HERNIA REPAIR     multiple   INCISION AND DRAINAGE ABSCESS Right 02/06/2022   Procedure: INCISION AND DRAINAGE RIGHT HAND;  Surgeon: Betha Loa, MD;  Location: [facility] OR;  Service: Orthopedics;  Laterality: Right;   INCISION AND DRAINAGE WOUND WITH NAILBED REPAIR Left 02/06/2022   Procedure: INCISION AND DRAINAGE LEFT INDEX FINGER;  Surgeon: Betha Loa, MD;  Location: [facility] OR;  Service: Orthopedics;  Laterality: Left;   INCISIONAL HERNIA REPAIR N/A 05/30/2015   Procedure: LAPAROSCOPIC  INCISIONAL HERNIA;  Surgeon: Emelia Loron, MD;  Location: [facility] OR;  Service: General;  Laterality: N/A;   INSERTION OF MESH N/A 05/30/2015   Procedure: INSERTION OF MESH;  Surgeon: Emelia Loron, MD;  Location: [facility] OR;  Service: General;  Laterality: N/A;   KNEE ARTHROSCOPY     right knee   LAPAROSCOPY N/A 05/30/2015   Procedure: LAPAROSCOPY DIAGNOSTIC;  Surgeon: Emelia Loron, MD;  Location: [facility] OR;  Service: General;  Laterality: N/A;   PARTIAL KNEE ARTHROPLASTY Right 10/30/2017   Procedure: UNICOMPARTMENTAL RIGHT KNEE LATERAL;  Surgeon: Durene Romans, MD;  Location: [facility];  Service: Orthopedics;  Laterality: Right;  90 mins   PARTIAL KNEE ARTHROPLASTY Left 06/29/2020   Procedure: UNICOMPARTMENTAL KNEE LATERALLY;  Surgeon: Durene Romans, MD;  Location: [facility];  Service: Orthopedics;  Laterality: Left;  70 MINS   TONSILLECTOMY     adenoids   Patient Active Problem List   Diagnosis Date Noted   OSA on CPAP 12/12/2022   DOE (dyspnea on exertion) 08/21/2022   Furuncles 02/08/2022   Infection of flexor tendon sheath 02/06/2022   Finger osteomyelitis, right (HCC) 02/06/2022   Leukocytosis 02/06/2022   History of COVID-19 02/06/2022  IBS (irritable bowel syndrome) 02/06/2022   Hypertriglyceridemia 02/06/2022   Obesity, Class III, BMI 40-49.9 (morbid obesity) (HCC) 02/06/2022   Lumbar facet joint syndrome 11/02/2021   Bilateral trigger thumb 06/18/2021   Chronic paronychia of finger 01/25/2021   S/P left unicompartmental knee replacement, lateral 06/29/2020   At risk for obstructive sleep apnea 06/11/2019   Cough 06/11/2019   Hyperglycemia 05/26/2019   Acute hypoxemic respiratory failure (HCC) 05/20/2019   Fibromyalgia 03/09/2019   Neck pain, chronic 03/09/2019   Acute shoulder bursitis, right 02/03/2019   Bile salt-induced diarrhea 09/25/2018   Arthritis of left acromioclavicular joint 09/23/2018   Precordial chest pain 08/11/2018   Morbid obesity (HCC) 10/31/2017   S/P  right UKR, lateral 10/30/2017   Incarcerated epigastric hernia 05/30/2015   Left foot pain 08/09/2014   Chronic diarrhea 07/27/2014   Gout 08/10/2012   Acid reflux 01/22/2012   Down syndrome 11/11/2011   Hernia of abdominal wall 10/21/2011   Psoriasis 10/21/2011   Uncontrolled type 2 diabetes mellitus with hyperglycemia, with long-term current use of insulin (HCC) 04/12/2011   Hypothyroid 04/12/2011   Gait abnormality 06/22/2010   Tibial tendinitis, posterior 06/22/2010    PCP: Copland MD  REFERRING PROVIDER: Benjamin Stain, MD  REFERRING DIAG: neck pain  THERAPY DIAG:  Cervicalgia  Cramp and spasm  Acute pain of right shoulder  Rationale for Evaluation and Treatment: Rehabilitation  ONSET DATE: 03/03/23  SUBJECTIVE:                                                                                                                                                                                                         SUBJECTIVE STATEMENT:  Reports sore but not as bad, depends on what I do   Patient reports neck pain and right shoulder pain, she is unsure of when the pain started or why it started, she reports sleeping in the right side mostly.  X-rays negative, Hand dominance: Right  PERTINENT HISTORY:  See above  PAIN:  Are you having pain? Yes: NPRS scale: 4/10 Pain location: neck and right shoulder, some HA Pain description: sore, ache Aggravating factors: lying on the right side, dressing and doing hair pain can be up to 9/10 Relieving factors: heat, Aleve  pain can be 3/10  PRECAUTIONS: None  RED FLAGS: None     WEIGHT BEARING RESTRICTIONS: No  FALLS:  Has patient fallen in last 6 months? No  LIVING ENVIRONMENT: Lives with: lives alone Lives in: House/apartment Stairs: No Has following equipment at home: Single point cane  OCCUPATION: none  PLOF: Independent  and school 3 x week, cooking shopping  PATIENT GOALS: have less pain, sleep  better  NEXT MD VISIT: none scheduled  OBJECTIVE:  Note: Objective measures were completed at Evaluation unless otherwise noted.  DIAGNOSTIC FINDINGS:  IMPRESSION: No acute displaced fracture or traumatic listhesis of the cervical spine. Limited evaluation due to overlapping osseous structures and overlying soft tissues.    PATIENT SURVEYS:  NDI 30/50 = 60%  COGNITION: Overall cognitive status: Within functional limits for tasks assessed  SENSATION: WFL  POSTURE: rounded shoulders, forward head, decreased lumbar lordosis, and increased thoracic kyphosis  PALPATION: C/o some tenderness in the neck mms, the right upper trap and in the right shoulder and upper arm area   CERVICAL ROM:   Active ROM A/PROM (deg) eval  Flexion Decreased 25%  Extension Decreased 75%  Right lateral flexion Decreased 75%  Left lateral flexion Decreased 75%  Right rotation Decreased 50%  Left rotation Decreased 50%   (Blank rows = not tested)  UPPER EXTREMITY ROM:  Active ROM Right AROM eval Left eval  Shoulder flexion 150   Shoulder extension    Shoulder abduction 130   Shoulder adduction    Shoulder extension    Shoulder internal rotation 40   Shoulder external rotation 60   Elbow flexion    Elbow extension    Wrist flexion    Wrist extension    Wrist ulnar deviation    Wrist radial deviation    Wrist pronation    Wrist supination     (Blank rows = not tested)  UPPER EXTREMITY MMT:  MMT Right eval Left eval  Shoulder flexion 4- 4-  Shoulder extension    Shoulder abduction 3+ 4-  Shoulder adduction    Shoulder extension    Shoulder internal rotation 3+ 4  Shoulder external rotation 3+ 4  Middle trapezius    Lower trapezius    Elbow flexion    Elbow extension    Wrist flexion    Wrist extension    Wrist ulnar deviation    Wrist radial deviation    Wrist pronation    Wrist supination    Grip strength     (Blank rows = not tested)  CERVICAL SPECIAL TESTS:   None tested  FUNCTIONAL TESTS:    TREATMENT DATE:  04/15/23 Nustep level 5 x 5 minutes Gait outside around the parking [place], with SPC, CGA down slope and with curbs and did a little grass needing CGA Red tband extension Red tband rows Red tband ER 2# shrugs with upper trap and levator stretches Weighted ball overhead lifts STM with the Tgun to the upper traps bilaterally  04/08/23 Nustep level 5 x 6 minutes Seated red tband Row Red tband extension Red tband ER Weighted ball overhead lifts 2 # shrugs Upper trap and levator stretches Cervical retraction STM to the right upper trap, rhomboid and neck with the Tgun  04/01/23 Seated shld row and shld ext red tband 2 sets 10 with postural cuing  Seated red tband shld diagnols 10 x Cerv retraction 15 x with head on ball on wall hold 3 sec Wall push ups with ball 10 x 5# standing shrugs ,backward rolls and shld ext 15 x each STW  and stretching to RT cerv and shld Theragun to RT cerv/trap area MH IFC RT cerv/shld in supine    03/25/23 STW  and stretching to RT cerv and shld Theragun to RT cerv/trap area Cerv retraction 15 x 3 sec hold Seated red tband  row,shld ext and ER- cued for posture as she tends to hold head in fwd flex and off to left Standing 3# shruggs,backward shld rolls and  shld ext 10 xeach MH IFC RT cerv/shld in supine  03/13/23 Eval                                                                                                                          PATIENT EDUCATION:  Education details: POC Person educated: Patient and Parent Education method: Explanation Education comprehension: verbalized understanding  HOME EXERCISE PROGRAM: Access Code: [email or web address] URL: [email or web address] Date: 04/01/2023 Prepared by: Angela Payseur  Exercises - Seated Cervical Retraction  - 3 x daily - 7 x weekly - 1 sets - 10 reps - 3 hold - Seated Scapular Retraction  - 3 x daily - 7 x weekly - 1 sets - 10  reps - 3 hold - Seated Shoulder Shrug  - 3 x daily - 7 x weekly - 1 sets - 10 reps - 3 hold - Seated Shoulder Shrug Circles AROM Backward  - 3 x daily - 7 x weekly - 1 sets - 10 reps - 3 hold - Doorway Pec Stretch at 60 Elevation  - 3 x daily - 7 x weekly - 1 sets - 10 reps - 3-5 at various angles hold  ASSESSMENT:  CLINICAL IMPRESSION:  Continue to advance exercises as she tolerates, she needs a lot of cues for posture and form of exercises, a lot of times she really cannot get in good posture due to fwd head and rounded shoulder posture.  We did some gait outside to help with her overall fitness and function, requires CGA with slopes, uneven terrain and with curbs  Patient is a 43 y.o. female who was seen today for physical therapy evaluation and treatment for neck pain, HA and right shoulder pain.  She is unsure of why.  She reports that she sleeps on the right side and she wakes up in pain, x-rays of the neck were negative.  She has a loss of ROM of the neck and the right shoulder.  MMs are tight and tender   OBJECTIVE IMPAIRMENTS: Abnormal gait, cardiopulmonary status limiting activity, decreased activity tolerance, decreased coordination, decreased endurance, decreased mobility, difficulty walking, decreased ROM, decreased strength, impaired perceived functional ability, increased muscle spasms, impaired flexibility, impaired UE functional use, improper body mechanics, postural dysfunction, and pain.   REHAB POTENTIAL: Good  CLINICAL DECISION MAKING: Stable/uncomplicated  EVALUATION COMPLEXITY: Low   GOALS: Goals reviewed with patient? Yes  SHORT TERM GOALS: Target date: 04/04/23  Independent with initial HEP Baseline:  Goal status: 04/01/23 MET  LONG TERM GOALS: Target date: 06/10/23  Independent with advanced HEP Baseline:  Goal status: INITIAL  2.  Understand posture and body mechanics Baseline:  Goal status: ongoing 04/08/23  3.  Increase cervical ROM 25% Baseline:   Goal status: progressing 04/15/23  4.  Report able to do hair and get dressed without pain >  4/10 Baseline:  Goal status:progressing 04/08/23  5.  Be able to lift and carry her bags, purse, shopping bags with no pain Baseline:  Goal status: progressing 04/15/23   PLAN:  PT FREQUENCY: 1x/week  PT DURATION: 12 weeks  PLANNED INTERVENTIONS: 97164- PT Re-evaluation, 97110-Therapeutic exercises, 97530- Therapeutic activity, 97112- Neuromuscular re-education, 97535- Self Care, 16109- Manual therapy, 97014- Electrical stimulation (unattended), 97012- Traction (mechanical), Patient/Family education, Dry Needling, Joint mobilization, Cryotherapy, and Moist heat  PLAN FOR NEXT SESSION: continue to progress as tolerated   Jearld Lesch, PT 04/15/2023, 3:34 PM

## 2023-04-22 DIAGNOSIS — E010 Iodine-deficiency related diffuse (endemic) goiter: Secondary | ICD-10-CM | POA: Diagnosis not present

## 2023-04-22 DIAGNOSIS — E039 Hypothyroidism, unspecified: Secondary | ICD-10-CM | POA: Diagnosis not present

## 2023-04-22 DIAGNOSIS — E1165 Type 2 diabetes mellitus with hyperglycemia: Secondary | ICD-10-CM | POA: Diagnosis not present

## 2023-04-22 DIAGNOSIS — E781 Pure hyperglyceridemia: Secondary | ICD-10-CM | POA: Diagnosis not present

## 2023-04-29 ENCOUNTER — Ambulatory Visit: Admitting: Physical Therapy

## 2023-04-29 DIAGNOSIS — M6281 Muscle weakness (generalized): Secondary | ICD-10-CM | POA: Diagnosis not present

## 2023-04-29 DIAGNOSIS — R252 Cramp and spasm: Secondary | ICD-10-CM | POA: Diagnosis not present

## 2023-04-29 DIAGNOSIS — M542 Cervicalgia: Secondary | ICD-10-CM

## 2023-04-29 DIAGNOSIS — M25641 Stiffness of right hand, not elsewhere classified: Secondary | ICD-10-CM | POA: Diagnosis not present

## 2023-04-29 DIAGNOSIS — M25511 Pain in right shoulder: Secondary | ICD-10-CM | POA: Diagnosis not present

## 2023-04-29 NOTE — Therapy (Signed)
 OUTPATIENT PHYSICAL THERAPY CERVICAL    Patient Name: Wanda Oneill MRN: [id number] DOB:September 04, 1980, 43 y.o., female Today's Date: 04/29/2023  END OF SESSION:  PT End of Session - 04/29/23 1224     Visit Number 6    Number of Visits 13    Date for PT Re-Evaluation 06/10/23    Authorization Type [employer]    PT Start Time 1225    PT Stop Time 1310    PT Time Calculation (min) 45 min             Past Medical History:  Diagnosis Date   Ambulates with cane    4 prong   Diabetes mellitus    type II   Down syndrome    Down's syndrome    Family history of adverse reaction to anesthesia    mom has had n/v   Gastritis    Headache    Hepatic steatosis    Hidradenitis    Hypothyroidism    Irritable bowel syndrome    Osteoarthritis    Periumbilical hernia    Pneumonia 03/2020   frequent    Restless legs    Sleep apnea    Tendonitis    chronic in left foot   Thyroid disease    hypothyroidism   Urinary incontinence 02/2022   some   UTI (urinary tract infection)    just dx 04/2022 - rx amoxicillin   Past Surgical History:  Procedure Laterality Date   AMPUTATION Right 04/11/2022   Procedure: RIGHT LONG FINGER AMPUTATION;  Surgeon: Betha Loa, MD;  Location: [facility] OR;  Service: Orthopedics;  Laterality: Right;   AXILLARY HIDRADENITIS EXCISION Bilateral    CHOLECYSTECTOMY     HERNIA REPAIR     multiple   INCISION AND DRAINAGE ABSCESS Right 02/06/2022   Procedure: INCISION AND DRAINAGE RIGHT HAND;  Surgeon: Betha Loa, MD;  Location: [facility] OR;  Service: Orthopedics;  Laterality: Right;   INCISION AND DRAINAGE WOUND WITH NAILBED REPAIR Left 02/06/2022   Procedure: INCISION AND DRAINAGE LEFT INDEX FINGER;  Surgeon: Betha Loa, MD;  Location: [facility] OR;  Service: Orthopedics;  Laterality: Left;   INCISIONAL HERNIA REPAIR N/A 05/30/2015   Procedure: LAPAROSCOPIC INCISIONAL HERNIA;  Surgeon: Emelia Loron, MD;  Location: [facility] OR;  Service: General;  Laterality: N/A;   INSERTION  OF MESH N/A 05/30/2015   Procedure: INSERTION OF MESH;  Surgeon: Emelia Loron, MD;  Location: [facility] OR;  Service: General;  Laterality: N/A;   KNEE ARTHROSCOPY     right knee   LAPAROSCOPY N/A 05/30/2015   Procedure: LAPAROSCOPY DIAGNOSTIC;  Surgeon: Emelia Loron, MD;  Location: [facility] OR;  Service: General;  Laterality: N/A;   PARTIAL KNEE ARTHROPLASTY Right 10/30/2017   Procedure: UNICOMPARTMENTAL RIGHT KNEE LATERAL;  Surgeon: Durene Romans, MD;  Location: [facility] ORS;  Service: Orthopedics;  Laterality: Right;  90 mins   PARTIAL KNEE ARTHROPLASTY Left 06/29/2020   Procedure: UNICOMPARTMENTAL KNEE LATERALLY;  Surgeon: Durene Romans, MD;  Location: [facility] OR[facility];  Service: Orthopedics;  Laterality: Left;  70 MINS   TONSILLECTOMY     adenoids   Patient Active Problem List   Diagnosis Date Noted   OSA on CPAP 12/12/2022   DOE (dyspnea on exertion) 08/21/2022   Furuncles 02/08/2022   Infection of flexor tendon sheath 02/06/2022   Finger osteomyelitis, right (HCC) 02/06/2022   Leukocytosis 02/06/2022   History of COVID-19 02/06/2022   IBS (irritable bowel syndrome) 02/06/2022   Hypertriglyceridemia 02/06/2022   Obesity, Class III, BMI 40-49.9 (morbid  obesity) (HCC) 02/06/2022   Lumbar facet joint syndrome 11/02/2021   Bilateral trigger thumb 06/18/2021   Chronic paronychia of finger 01/25/2021   S/P left unicompartmental knee replacement, lateral 06/29/2020   At risk for obstructive sleep apnea 06/11/2019   Cough 06/11/2019   Hyperglycemia 05/26/2019   Acute hypoxemic respiratory failure (HCC) 05/20/2019   Fibromyalgia 03/09/2019   Neck pain, chronic 03/09/2019   Acute shoulder bursitis, right 02/03/2019   Bile salt-induced diarrhea 09/25/2018   Arthritis of left acromioclavicular joint 09/23/2018   Precordial chest pain 08/11/2018   Morbid obesity (HCC) 10/31/2017   S/P right UKR, lateral 10/30/2017   Incarcerated epigastric hernia 05/30/2015   Left foot pain 08/09/2014   Chronic  diarrhea 07/27/2014   Gout 08/10/2012   Acid reflux 01/22/2012   Down syndrome 11/11/2011   Hernia of abdominal wall 10/21/2011   Psoriasis 10/21/2011   Uncontrolled type 2 diabetes mellitus with hyperglycemia, with long-term current use of insulin (HCC) 04/12/2011   Hypothyroid 04/12/2011   Gait abnormality 06/22/2010   Tibial tendinitis, posterior 06/22/2010    PCP: Copland MD  REFERRING PROVIDER: Benjamin Stain, MD  REFERRING DIAG: neck pain  THERAPY DIAG:  Cervicalgia  Cramp and spasm  Acute pain of right shoulder  Stiffness of right hand, not elsewhere classified  Muscle weakness (generalized)  Rationale for Evaluation and Treatment: Rehabilitation  ONSET DATE: 03/03/23  SUBJECTIVE:                                                                                                                                                                                                         SUBJECTIVE STATEMENT: I am fine-sore  PERTINENT HISTORY:  See above  PAIN:  Are you having pain? Sore ,neck and arm  PRECAUTIONS: None  RED FLAGS: None     WEIGHT BEARING RESTRICTIONS: No  FALLS:  Has patient fallen in last 6 months? No  LIVING ENVIRONMENT: Lives with: lives alone Lives in: House/apartment Stairs: No Has following equipment at home: Single point cane  OCCUPATION: none  PLOF: Independent and school 3 x week, cooking shopping  PATIENT GOALS: have less pain, sleep better  NEXT MD VISIT: none scheduled  OBJECTIVE:  Note: Objective measures were completed at Evaluation unless otherwise noted.  DIAGNOSTIC FINDINGS:  IMPRESSION: No acute displaced fracture or traumatic listhesis of the cervical spine. Limited evaluation due to overlapping osseous structures and overlying soft tissues.    PATIENT SURVEYS:  NDI 30/50 = 60%  COGNITION: Overall cognitive status: Within functional limits for tasks assessed  SENSATION: WFL  POSTURE: rounded  shoulders, forward head, decreased lumbar lordosis, and increased thoracic kyphosis  PALPATION: C/o some tenderness in the neck mms, the right upper trap and in the right shoulder and upper arm area   CERVICAL ROM:   Active ROM A/PROM (deg) eval AROM sitting 04/29/23  Flexion Decreased 25% WFLS  Extension Decreased 75% WFLS  Right lateral flexion Decreased 75% Decreased 25%  Left lateral flexion Decreased 75% Decreased 25%  Right rotation Decreased 50% WFLS  Left rotation Decreased 50% Decreased 25%   (Blank rows = not tested)  UPPER EXTREMITY ROM:  Active ROM Right AROM eval Left eval RT AROM sitting  Shoulder flexion 150  165  Shoulder extension     Shoulder abduction 130  160  Shoulder adduction     Shoulder extension     Shoulder internal rotation 40  65  Shoulder external rotation 60  70  Elbow flexion     Elbow extension     Wrist flexion     Wrist extension     Wrist ulnar deviation     Wrist radial deviation     Wrist pronation     Wrist supination      (Blank rows = not tested)  UPPER EXTREMITY MMT:  MMT Right eval Left eval RT/Left 04/29/23  Shoulder flexion 4- 4- 4/4+  Shoulder extension     Shoulder abduction 3+ 4- 4+/4+  Shoulder adduction     Shoulder extension     Shoulder internal rotation 3+ 4 4/4+  Shoulder external rotation 3+ 4 4/4+  Middle trapezius     Lower trapezius     Elbow flexion     Elbow extension     Wrist flexion     Wrist extension     Wrist ulnar deviation     Wrist radial deviation     Wrist pronation     Wrist supination     Grip strength      (Blank rows = not tested)  CERVICAL SPECIAL TESTS:  None tested  FUNCTIONAL TESTS:    TREATMENT DATE:   04/29/23 Assessed ROM cerv and shld and MMT, goals documented Nustep L 5 UBE L 2 2 min fwd/2 min backward Red tband extension 2 sets 10 Red tband rows 2 sets 10 Red tband ER 2 sets 10 Modified sit up with wt ball into chest press 2 sets 10 Wt ball OH press  10 x 3# standing shld flex,abd,upright row,chest press 10 x each STM with the Tgun to the upper traps bilaterally     04/15/23 Nustep level 5 x 5 minutes Gait outside around the parking [place], with SPC, CGA down slope and with curbs and did a little grass needing CGA Red tband extension Red tband rows Red tband ER 2# shrugs with upper trap and levator stretches Weighted ball overhead lifts STM with the Tgun to the upper traps bilaterally  04/08/23 Nustep level 5 x 6 minutes Seated red tband Row Red tband extension Red tband ER Weighted ball overhead lifts 2 # shrugs Upper trap and levator stretches Cervical retraction STM to the right upper trap, rhomboid and neck with the Tgun  04/01/23 Seated shld row and shld ext red tband 2 sets 10 with postural cuing  Seated red tband shld diagnols 10 x Cerv retraction 15 x with head on ball on wall hold 3 sec Wall push ups with ball 10 x 5# standing shrugs ,backward rolls and shld ext 15 x each STW  and stretching to RT  cerv and shld Theragun to RT cerv/trap area MH IFC RT cerv/shld in supine    03/25/23 STW  and stretching to RT cerv and shld Theragun to RT cerv/trap area Cerv retraction 15 x 3 sec hold Seated red tband row,shld ext and ER- cued for posture as she tends to hold head in fwd flex and off to left Standing 3# shruggs,backward shld rolls and  shld ext 10 xeach MH IFC RT cerv/shld in supine  03/13/23 Eval                                                                                                                          PATIENT EDUCATION:  Education details: POC Person educated: Patient and Parent Education method: Explanation Education comprehension: verbalized understanding  HOME EXERCISE PROGRAM: Access Code: [email or web address] URL: [email or web address] Date: 04/01/2023 Prepared by: Shrihaan Porzio  Exercises - Seated Cervical Retraction  - 3 x daily - 7 x weekly - 1 sets - 10 reps - 3 hold -  Seated Scapular Retraction  - 3 x daily - 7 x weekly - 1 sets - 10 reps - 3 hold - Seated Shoulder Shrug  - 3 x daily - 7 x weekly - 1 sets - 10 reps - 3 hold - Seated Shoulder Shrug Circles AROM Backward  - 3 x daily - 7 x weekly - 1 sets - 10 reps - 3 hold - Doorway Pec Stretch at 60 Elevation  - 3 x daily - 7 x weekly - 1 sets - 10 reps - 3-5 at various angles hold  ASSESSMENT:  CLINICAL IMPRESSION:  Continue to advance exercises as she tolerates for strength, she needs a lot of cues for posture and form of exercises, a lot of times she really cannot get in good posture due to fwd head and rounded shoulder posture.  Assessed ROM and MMT and she is improving. Documented goals.We talked about strategies for lighter bags ie purse and grocery bags and she VU.  Pt also stated doing ex at her school, walking and a" boot camp" 2 x a week  OBJECTIVE IMPAIRMENTS: Abnormal gait, cardiopulmonary status limiting activity, decreased activity tolerance, decreased coordination, decreased endurance, decreased mobility, difficulty walking, decreased ROM, decreased strength, impaired perceived functional ability, increased muscle spasms, impaired flexibility, impaired UE functional use, improper body mechanics, postural dysfunction, and pain.   REHAB POTENTIAL: Good  CLINICAL DECISION MAKING: Stable/uncomplicated  EVALUATION COMPLEXITY: Low   GOALS: Goals reviewed with patient? Yes  SHORT TERM GOALS: Target date: 04/04/23  Independent with initial HEP Baseline:  Goal status: 04/01/23 MET  LONG TERM GOALS: Target date: 06/10/23  Independent with advanced HEP Baseline:  Goal status: 04/29/23 evolving, doing ex at her scholl too  2.  Understand posture and body mechanics Baseline:  Goal status: ongoing 04/08/23 and 04/29/23  3.  Increase cervical ROM 25% Baseline:  Goal status: progressing 04/15/23  and 04/29/23  4.  Report able to do hair and get dressed without pain >  4/10 Baseline:  Goal  status:progressing 04/08/23 and 04/29/23 "sometimes"                      5.  Be able to lift and carry her bags, purse, shopping bags with no pain Baseline:  Goal status: progressing 04/15/23 and 04/29/23   PLAN:  PT FREQUENCY: 1x/week  PT DURATION: 12 weeks  PLANNED INTERVENTIONS: 97164- PT Re-evaluation, 97110-Therapeutic exercises, 97530- Therapeutic activity, 97112- Neuromuscular re-education, 97535- Self Care, 16109- Manual therapy, 97014- Electrical stimulation (unattended), 97012- Traction (mechanical), Patient/Family education, Dry Needling, Joint mobilization, Cryotherapy, and Moist heat  PLAN FOR NEXT SESSION: continue to progress as tolerated   Trent Theisen,ANGIE, PTA 04/29/2023, 12:24 PM     [facility] [address] [facility] [place], [place], [address] Phone: [phone]   Fax:  [phone]  Patient Details  Name: Wanda Oneill MRN: [id number] Date of Birth: 1981/02/01 Referring Provider:  Monica Becton,*  Encounter Date: 04/29/2023   Suanne Marker, PTA 04/29/2023, 12:24 PM  [facility] [address] [facility] [place], [place], [address] Phone: [phone]   Fax:  [phone]

## 2023-05-01 DIAGNOSIS — J219 Acute bronchiolitis, unspecified: Secondary | ICD-10-CM | POA: Diagnosis not present

## 2023-05-02 ENCOUNTER — Other Ambulatory Visit: Payer: Self-pay | Admitting: Family Medicine

## 2023-05-06 ENCOUNTER — Ambulatory Visit: Attending: Sports Medicine | Admitting: Physical Therapy

## 2023-05-06 ENCOUNTER — Encounter: Payer: Self-pay | Admitting: Physical Therapy

## 2023-05-06 DIAGNOSIS — M25511 Pain in right shoulder: Secondary | ICD-10-CM | POA: Diagnosis present

## 2023-05-06 DIAGNOSIS — R262 Difficulty in walking, not elsewhere classified: Secondary | ICD-10-CM | POA: Diagnosis present

## 2023-05-06 DIAGNOSIS — M6281 Muscle weakness (generalized): Secondary | ICD-10-CM | POA: Diagnosis present

## 2023-05-06 DIAGNOSIS — R252 Cramp and spasm: Secondary | ICD-10-CM | POA: Diagnosis present

## 2023-05-06 DIAGNOSIS — M25641 Stiffness of right hand, not elsewhere classified: Secondary | ICD-10-CM | POA: Insufficient documentation

## 2023-05-06 DIAGNOSIS — M542 Cervicalgia: Secondary | ICD-10-CM | POA: Insufficient documentation

## 2023-05-06 NOTE — Therapy (Signed)
 OUTPATIENT PHYSICAL THERAPY CERVICAL    Patient Name: Wanda Oneill MRN: [id number] DOB:03-04-1980, 43 y.o., female Today's Date: 05/06/2023  END OF SESSION:  PT End of Session - 05/06/23 1529     Visit Number 7    Number of Visits 13    Date for PT Re-Evaluation 06/14/23    Authorization Type [employer]    PT Start Time 1525    PT Stop Time 1614    PT Time Calculation (min) 49 min    Activity Tolerance Patient tolerated treatment well    Behavior During Therapy WFL for tasks assessed/performed             Past Medical History:  Diagnosis Date   Ambulates with cane    4 prong   Diabetes mellitus    type II   Down syndrome    Down's syndrome    Family history of adverse reaction to anesthesia    mom has had n/v   Gastritis    Headache    Hepatic steatosis    Hidradenitis    Hypothyroidism    Irritable bowel syndrome    Osteoarthritis    Periumbilical hernia    Pneumonia 03/2020   frequent    Restless legs    Sleep apnea    Tendonitis    chronic in left foot   Thyroid disease    hypothyroidism   Urinary incontinence 02/2022   some   UTI (urinary tract infection)    just dx 04/2022 - rx amoxicillin   Past Surgical History:  Procedure Laterality Date   AMPUTATION Right 04/11/2022   Procedure: RIGHT LONG FINGER AMPUTATION;  Surgeon: Betha Loa, MD;  Location: [facility] OR;  Service: Orthopedics;  Laterality: Right;   AXILLARY HIDRADENITIS EXCISION Bilateral    CHOLECYSTECTOMY     HERNIA REPAIR     multiple   INCISION AND DRAINAGE ABSCESS Right 02/06/2022   Procedure: INCISION AND DRAINAGE RIGHT HAND;  Surgeon: Betha Loa, MD;  Location: [facility] OR;  Service: Orthopedics;  Laterality: Right;   INCISION AND DRAINAGE WOUND WITH NAILBED REPAIR Left 02/06/2022   Procedure: INCISION AND DRAINAGE LEFT INDEX FINGER;  Surgeon: Betha Loa, MD;  Location: [facility] OR;  Service: Orthopedics;  Laterality: Left;   INCISIONAL HERNIA REPAIR N/A 05/30/2015   Procedure: LAPAROSCOPIC  INCISIONAL HERNIA;  Surgeon: Emelia Loron, MD;  Location: [facility] OR;  Service: General;  Laterality: N/A;   INSERTION OF MESH N/A 05/30/2015   Procedure: INSERTION OF MESH;  Surgeon: Emelia Loron, MD;  Location: [facility] OR;  Service: General;  Laterality: N/A;   KNEE ARTHROSCOPY     right knee   LAPAROSCOPY N/A 05/30/2015   Procedure: LAPAROSCOPY DIAGNOSTIC;  Surgeon: Emelia Loron, MD;  Location: [facility] OR;  Service: General;  Laterality: N/A;   PARTIAL KNEE ARTHROPLASTY Right 10/30/2017   Procedure: UNICOMPARTMENTAL RIGHT KNEE LATERAL;  Surgeon: Durene Romans, MD;  Location: [facility] ORS;  Service: Orthopedics;  Laterality: Right;  90 mins   PARTIAL KNEE ARTHROPLASTY Left 06/29/2020   Procedure: UNICOMPARTMENTAL KNEE LATERALLY;  Surgeon: Durene Romans, MD;  Location: [facility];  Service: Orthopedics;  Laterality: Left;  70 MINS   TONSILLECTOMY     adenoids   Patient Active Problem List   Diagnosis Date Noted   OSA on CPAP 12/12/2022   DOE (dyspnea on exertion) 08/21/2022   Furuncles 02/08/2022   Infection of flexor tendon sheath 02/06/2022   Finger osteomyelitis, right (HCC) 02/06/2022   Leukocytosis 02/06/2022   History of COVID-19 02/06/2022  IBS (irritable bowel syndrome) 02/06/2022   Hypertriglyceridemia 02/06/2022   Obesity, Class III, BMI 40-49.9 (morbid obesity) (HCC) 02/06/2022   Lumbar facet joint syndrome 11/02/2021   Bilateral trigger thumb 06/18/2021   Chronic paronychia of finger 01/25/2021   S/P left unicompartmental knee replacement, lateral 06/29/2020   At risk for obstructive sleep apnea 06/11/2019   Cough 06/11/2019   Hyperglycemia 05/26/2019   Acute hypoxemic respiratory failure (HCC) 05/20/2019   Fibromyalgia 03/09/2019   Neck pain, chronic 03/09/2019   Acute shoulder bursitis, right 02/03/2019   Bile salt-induced diarrhea 09/25/2018   Arthritis of left acromioclavicular joint 09/23/2018   Precordial chest pain 08/11/2018   Morbid obesity (HCC) 10/31/2017   S/P  right UKR, lateral 10/30/2017   Incarcerated epigastric hernia 05/30/2015   Left foot pain 08/09/2014   Chronic diarrhea 07/27/2014   Gout 08/10/2012   Acid reflux 01/22/2012   Down syndrome 11/11/2011   Hernia of abdominal wall 10/21/2011   Psoriasis 10/21/2011   Uncontrolled type 2 diabetes mellitus with hyperglycemia, with long-term current use of insulin (HCC) 04/12/2011   Hypothyroid 04/12/2011   Gait abnormality 06/22/2010   Tibial tendinitis, posterior 06/22/2010    PCP: Copland MD  REFERRING PROVIDER: Benjamin Stain, MD  REFERRING DIAG: neck pain  THERAPY DIAG:  Cervicalgia  Cramp and spasm  Acute pain of right shoulder  Stiffness of right hand, not elsewhere classified  Muscle weakness (generalized)  Difficulty in walking, not elsewhere classified  Rationale for Evaluation and Treatment: Rehabilitation  ONSET DATE: 03/03/23  SUBJECTIVE:                                                                                                                                                                                                         SUBJECTIVE STATEMENT: Back hurts a 6/10, shoulders hurt some  PERTINENT HISTORY:  See above  PAIN:  Are you having pain? Sore ,neck and arm  PRECAUTIONS: None  RED FLAGS: None     WEIGHT BEARING RESTRICTIONS: No  FALLS:  Has patient fallen in last 6 months? No  LIVING ENVIRONMENT: Lives with: lives alone Lives in: House/apartment Stairs: No Has following equipment at home: Single point cane  OCCUPATION: none  PLOF: Independent and school 3 x week, cooking shopping  PATIENT GOALS: have less pain, sleep better  NEXT MD VISIT: none scheduled  OBJECTIVE:  Note: Objective measures were completed at Evaluation unless otherwise noted.  DIAGNOSTIC FINDINGS:  IMPRESSION: No acute displaced fracture or traumatic listhesis of the cervical spine. Limited evaluation due to overlapping osseous structures  and overlying  soft tissues.    PATIENT SURVEYS:  NDI 30/50 = 60%  COGNITION: Overall cognitive status: Within functional limits for tasks assessed  SENSATION: WFL  POSTURE: rounded shoulders, forward head, decreased lumbar lordosis, and increased thoracic kyphosis  PALPATION: C/o some tenderness in the neck mms, the right upper trap and in the right shoulder and upper arm area   CERVICAL ROM:   Active ROM A/PROM (deg) eval AROM sitting 04/29/23  Flexion Decreased 25% WFLS  Extension Decreased 75% WFLS  Right lateral flexion Decreased 75% Decreased 25%  Left lateral flexion Decreased 75% Decreased 25%  Right rotation Decreased 50% WFLS  Left rotation Decreased 50% Decreased 25%   (Blank rows = not tested)  UPPER EXTREMITY ROM:  Active ROM Right AROM eval Left eval RT AROM sitting  Shoulder flexion 150  165  Shoulder extension     Shoulder abduction 130  160  Shoulder adduction     Shoulder extension     Shoulder internal rotation 40  65  Shoulder external rotation 60  70  Elbow flexion     Elbow extension     Wrist flexion     Wrist extension     Wrist ulnar deviation     Wrist radial deviation     Wrist pronation     Wrist supination      (Blank rows = not tested)  UPPER EXTREMITY MMT:  MMT Right eval Left eval RT/Left 04/29/23  Shoulder flexion 4- 4- 4/4+  Shoulder extension     Shoulder abduction 3+ 4- 4+/4+  Shoulder adduction     Shoulder extension     Shoulder internal rotation 3+ 4 4/4+  Shoulder external rotation 3+ 4 4/4+  Middle trapezius     Lower trapezius     Elbow flexion     Elbow extension     Wrist flexion     Wrist extension     Wrist ulnar deviation     Wrist radial deviation     Wrist pronation     Wrist supination     Grip strength      (Blank rows = not tested)  CERVICAL SPECIAL TESTS:  None tested  FUNCTIONAL TESTS:    TREATMENT DATE:  05/06/23 Nustep level 5 x 6 minutes Gait outside around the back parking  island at times [facility] for a little instability Red tband row and extension STM to the upper traps and into the cervical area Supine feet on ball K2C, trunk rotation, isometric abs  04/29/23 Assessed ROM cerv and shld and MMT, goals documented Nustep L 5 UBE L 2 2 min fwd/2 min backward Red tband extension 2 sets 10 Red tband rows 2 sets 10 Red tband ER 2 sets 10 Modified sit up with wt ball into chest press 2 sets 10 Wt ball OH press 10 x 3# standing shld flex,abd,upright row,chest press 10 x each STM with the Tgun to the upper traps bilaterally  04/15/23 Nustep level 5 x 5 minutes Gait outside around the parking [place], with SPC, CGA down slope and with curbs and did a little grass needing CGA Red tband extension Red tband rows Red tband ER 2# shrugs with upper trap and levator stretches Weighted ball overhead lifts STM with the Tgun to the upper traps bilaterally  04/08/23 Nustep level 5 x 6 minutes Seated red tband Row Red tband extension Red tband ER Weighted ball overhead lifts 2 # shrugs Upper trap and levator stretches Cervical retraction STM to  the right upper trap, rhomboid and neck with the Tgun  04/01/23 Seated shld row and shld ext red tband 2 sets 10 with postural cuing  Seated red tband shld diagnols 10 x Cerv retraction 15 x with head on ball on wall hold 3 sec Wall push ups with ball 10 x 5# standing shrugs ,backward rolls and shld ext 15 x each STW  and stretching to RT cerv and shld Theragun to RT cerv/trap area MH IFC RT cerv/shld in supine  03/25/23 STW  and stretching to RT cerv and shld Theragun to RT cerv/trap area Cerv retraction 15 x 3 sec hold Seated red tband row,shld ext and ER- cued for posture as she tends to hold head in fwd flex and off to left Standing 3# shruggs,backward shld rolls and  shld ext 10 xeach MH IFC RT cerv/shld in supine  03/13/23 Eval                                                                                                                           PATIENT EDUCATION:  Education details: POC Person educated: Patient and Parent Education method: Explanation Education comprehension: verbalized understanding  HOME EXERCISE PROGRAM: Access Code: [email or web address] URL: [email or web address] Date: 04/01/2023 Prepared by: Angela Payseur  Exercises - Seated Cervical Retraction  - 3 x daily - 7 x weekly - 1 sets - 10 reps - 3 hold - Seated Scapular Retraction  - 3 x daily - 7 x weekly - 1 sets - 10 reps - 3 hold - Seated Shoulder Shrug  - 3 x daily - 7 x weekly - 1 sets - 10 reps - 3 hold - Seated Shoulder Shrug Circles AROM Backward  - 3 x daily - 7 x weekly - 1 sets - 10 reps - 3 hold - Doorway Pec Stretch at 60 Elevation  - 3 x daily - 7 x weekly - 1 sets - 10 reps - 3-5 at various angles hold  ASSESSMENT:  CLINICAL IMPRESSION:  Was able to take a large walk with her today, at times was slightly off balance and needed HHA especially when she got fatigued and she was coming up a slope, she needs a lot of cues for posture and form of exercises, a lot of times she really cannot get in good posture due to fwd head and rounded shoulder posture.   Pt also stated doing ex at her school, walking and a" boot camp" 2 x a week.  She reports that what we are doing is helping her pain levels overall  OBJECTIVE IMPAIRMENTS: Abnormal gait, cardiopulmonary status limiting activity, decreased activity tolerance, decreased coordination, decreased endurance, decreased mobility, difficulty walking, decreased ROM, decreased strength, impaired perceived functional ability, increased muscle spasms, impaired flexibility, impaired UE functional use, improper body mechanics, postural dysfunction, and pain.   REHAB POTENTIAL: Good  CLINICAL DECISION MAKING: Stable/uncomplicated  EVALUATION COMPLEXITY: Low   GOALS: Goals reviewed with patient? 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  Understand posture and body mechanics Baseline:  Goal status: ongoing 04/08/23 and 04/29/23  3.  Increase cervical ROM 25% Baseline:  Goal status: met 05/06/23  4.  Report able to do hair and get dressed without pain > 4/10 Baseline:  Goal status:progressing 04/08/23 and 04/29/23 "sometimes"                      5.  Be able to lift and carry her bags, purse, shopping bags with no pain Baseline:  Goal status: progressing 04/15/23 and 04/29/23   PLAN:  PT FREQUENCY: 1x/week  PT DURATION: 12 weeks  PLANNED INTERVENTIONS: 97164- PT Re-evaluation, 97110-Therapeutic exercises, 97530- Therapeutic activity, 97112- Neuromuscular re-education, 97535- Self Care, 16109- Manual therapy, 97014- Electrical stimulation (unattended), 97012- Traction (mechanical), Patient/Family education, Dry Needling, Joint mobilization, Cryotherapy, and Moist heat  PLAN FOR NEXT SESSION: continue to progress as tolerated   Jearld Lesch, PT 05/06/2023, 3:30 PM     [place] [facility] [address] [facility] [place], [place], [address] Phone: [phone]   Fax:  [phone]

## 2023-05-13 ENCOUNTER — Ambulatory Visit: Admitting: Physical Therapy

## 2023-05-13 DIAGNOSIS — R252 Cramp and spasm: Secondary | ICD-10-CM

## 2023-05-13 DIAGNOSIS — M25641 Stiffness of right hand, not elsewhere classified: Secondary | ICD-10-CM

## 2023-05-13 DIAGNOSIS — R262 Difficulty in walking, not elsewhere classified: Secondary | ICD-10-CM | POA: Diagnosis not present

## 2023-05-13 DIAGNOSIS — M542 Cervicalgia: Secondary | ICD-10-CM | POA: Diagnosis not present

## 2023-05-13 DIAGNOSIS — M25511 Pain in right shoulder: Secondary | ICD-10-CM

## 2023-05-13 DIAGNOSIS — M6281 Muscle weakness (generalized): Secondary | ICD-10-CM | POA: Diagnosis not present

## 2023-05-13 NOTE — Therapy (Signed)
 OUTPATIENT PHYSICAL THERAPY CERVICAL    Patient Name: Wanda Oneill MRN: [id number] DOB:03/22/1980, 43 y.o., female Today's Date: 05/13/2023  END OF SESSION:  PT End of Session - 05/13/23 1228     Visit Number 8    Number of Visits 13    Date for PT Re-Evaluation 06/14/23    Authorization Type [employer]    PT Start Time 1230    PT Stop Time 1315    PT Time Calculation (min) 45 min             Past Medical History:  Diagnosis Date   Ambulates with cane    4 prong   Diabetes mellitus    type II   Down syndrome    Down's syndrome    Family history of adverse reaction to anesthesia    mom has had n/v   Gastritis    Headache    Hepatic steatosis    Hidradenitis    Hypothyroidism    Irritable bowel syndrome    Osteoarthritis    Periumbilical hernia    Pneumonia 03/2020   frequent    Restless legs    Sleep apnea    Tendonitis    chronic in left foot   Thyroid disease    hypothyroidism   Urinary incontinence 02/2022   some   UTI (urinary tract infection)    just dx 04/2022 - rx amoxicillin   Past Surgical History:  Procedure Laterality Date   AMPUTATION Right 04/11/2022   Procedure: RIGHT LONG FINGER AMPUTATION;  Surgeon: Betha Loa, MD;  Location: [facility] OR;  Service: Orthopedics;  Laterality: Right;   AXILLARY HIDRADENITIS EXCISION Bilateral    CHOLECYSTECTOMY     HERNIA REPAIR     multiple   INCISION AND DRAINAGE ABSCESS Right 02/06/2022   Procedure: INCISION AND DRAINAGE RIGHT HAND;  Surgeon: Betha Loa, MD;  Location: [facility] OR;  Service: Orthopedics;  Laterality: Right;   INCISION AND DRAINAGE WOUND WITH NAILBED REPAIR Left 02/06/2022   Procedure: INCISION AND DRAINAGE LEFT INDEX FINGER;  Surgeon: Betha Loa, MD;  Location: [facility] OR;  Service: Orthopedics;  Laterality: Left;   INCISIONAL HERNIA REPAIR N/A 05/30/2015   Procedure: LAPAROSCOPIC INCISIONAL HERNIA;  Surgeon: Emelia Loron, MD;  Location[facility] OR;  Service: General;  Laterality: N/A;   INSERTION  OF MESH N/A 05/30/2015   Procedure: INSERTION OF MESH;  Surgeon: Emelia Loron, MD;  Location: [facility] OR;  Service: General;  Laterality: N/A;   KNEE ARTHROSCOPY     right knee   LAPAROSCOPY N/A 05/30/2015   Procedure: LAPAROSCOPY DIAGNOSTIC;  Surgeon: Emelia Loron, MD;  Location: [facility] OR;  Service: General;  Laterality: N/A;   PARTIAL KNEE ARTHROPLASTY Right 10/30/2017   Procedure: UNICOMPARTMENTAL RIGHT KNEE LATERAL;  Surgeon: Durene Romans, MD;  Location: [facility] ORS;  Service: Orthopedics;  Laterality: Right;  90 mins   PARTIAL KNEE ARTHROPLASTY Left 06/29/2020   Procedure: UNICOMPARTMENTAL KNEE LATERALLY;  Surgeon: Durene Romans, MD;  Location: [facility] ORS;  Service: Orthopedics;  Laterality: Left;  70 MINS   TONSILLECTOMY     adenoids   Patient Active Problem List   Diagnosis Date Noted   OSA on CPAP 12/12/2022   DOE (dyspnea on exertion) 08/21/2022   Furuncles 02/08/2022   Infection of flexor tendon sheath 02/06/2022   Finger osteomyelitis, right (HCC) 02/06/2022   Leukocytosis 02/06/2022   History of COVID-19 02/06/2022   IBS (irritable bowel syndrome) 02/06/2022   Hypertriglyceridemia 02/06/2022   Obesity, Class III, BMI 40-49.9 (morbid  obesity) (HCC) 02/06/2022   Lumbar facet joint syndrome 11/02/2021   Bilateral trigger thumb 06/18/2021   Chronic paronychia of finger 01/25/2021   S/P left unicompartmental knee replacement, lateral 06/29/2020   At risk for obstructive sleep apnea 06/11/2019   Cough 06/11/2019   Hyperglycemia 05/26/2019   Acute hypoxemic respiratory failure (HCC) 05/20/2019   Fibromyalgia 03/09/2019   Neck pain, chronic 03/09/2019   Acute shoulder bursitis, right 02/03/2019   Bile salt-induced diarrhea 09/25/2018   Arthritis of left acromioclavicular joint 09/23/2018   Precordial chest pain 08/11/2018   Morbid obesity (HCC) 10/31/2017   S/P right UKR, lateral 10/30/2017   Incarcerated epigastric hernia 05/30/2015   Left foot pain 08/09/2014   Chronic  diarrhea 07/27/2014   Gout 08/10/2012   Acid reflux 01/22/2012   Down syndrome 11/11/2011   Hernia of abdominal wall 10/21/2011   Psoriasis 10/21/2011   Uncontrolled type 2 diabetes mellitus with hyperglycemia, with long-term current use of insulin (HCC) 04/12/2011   Hypothyroid 04/12/2011   Gait abnormality 06/22/2010   Tibial tendinitis, posterior 06/22/2010    PCP: Copland MD  REFERRING PROVIDER: Benjamin Stain, MD  REFERRING DIAG: neck pain  THERAPY DIAG:  Cervicalgia  Cramp and spasm  Acute pain of right shoulder  Stiffness of right hand, not elsewhere classified  Rationale for Evaluation and Treatment: Rehabilitation  ONSET DATE: 03/03/23  SUBJECTIVE:                                                                                                                                                                                                         SUBJECTIVE STATEMENT: I still hurt but not as bad, about 50% better overalll  PERTINENT HISTORY:  See above  PAIN:  Are you having pain? Sore ,neck and arm  PRECAUTIONS: None  RED FLAGS: None     WEIGHT BEARING RESTRICTIONS: No  FALLS:  Has patient fallen in last 6 months? No  LIVING ENVIRONMENT: Lives with: lives alone Lives in: House/apartment Stairs: No Has following equipment at home: Single point cane  OCCUPATION: none  PLOF: Independent and school 3 x week, cooking shopping  PATIENT GOALS: have less pain, sleep better  NEXT MD VISIT: none scheduled  OBJECTIVE:  Note: Objective measures were completed at Evaluation unless otherwise noted.  DIAGNOSTIC FINDINGS:  IMPRESSION: No acute displaced fracture or traumatic listhesis of the cervical spine. Limited evaluation due to overlapping osseous structures and overlying soft tissues.    PATIENT SURVEYS:  NDI 30/50 = 60%  COGNITION: Overall cognitive status: Within functional limits for tasks assessed  SENSATION:  WFL  POSTURE: rounded  shoulders, forward head, decreased lumbar lordosis, and increased thoracic kyphosis  PALPATION: C/o some tenderness in the neck mms, the right upper trap and in the right shoulder and upper arm area   CERVICAL ROM:   Active ROM A/PROM (deg) eval AROM sitting 04/29/23  Flexion Decreased 25% WFLS  Extension Decreased 75% WFLS  Right lateral flexion Decreased 75% Decreased 25%  Left lateral flexion Decreased 75% Decreased 25%  Right rotation Decreased 50% WFLS  Left rotation Decreased 50% Decreased 25%   (Blank rows = not tested)  UPPER EXTREMITY ROM:  Active ROM Right AROM eval Left eval RT AROM sitting  Shoulder flexion 150  165  Shoulder extension     Shoulder abduction 130  160  Shoulder adduction     Shoulder extension     Shoulder internal rotation 40  65  Shoulder external rotation 60  70  Elbow flexion     Elbow extension     Wrist flexion     Wrist extension     Wrist ulnar deviation     Wrist radial deviation     Wrist pronation     Wrist supination      (Blank rows = not tested)  UPPER EXTREMITY MMT:  MMT Right eval Left eval RT/Left 04/29/23  Shoulder flexion 4- 4- 4/4+  Shoulder extension     Shoulder abduction 3+ 4- 4+/4+  Shoulder adduction     Shoulder extension     Shoulder internal rotation 3+ 4 4/4+  Shoulder external rotation 3+ 4 4/4+  Middle trapezius     Lower trapezius     Elbow flexion     Elbow extension     Wrist flexion     Wrist extension     Wrist ulnar deviation     Wrist radial deviation     Wrist pronation     Wrist supination     Grip strength      (Blank rows = not tested)  CERVICAL SPECIAL TESTS:  None tested  FUNCTIONAL TESTS:    TREATMENT DATE:   05/13/23 Nustep L 5 Seated red tband Row 2 sets 10 Red tband extension 2 sets 10 Red tband ER 2 sets 10 Weighted ball overhead lifts 10 x then chest press 10 x Black tband trunk flex and ext 2 sets 10 Amb with HHA in clinic working on stride and  cadeance STM to the upper traps and into the cervical area- with and without theragun     05/06/23 Nustep level 5 x 6 minutes Gait outside around the back parking island at times [facility] for a little instability Red tband row and extension STM to the upper traps and into the cervical area Supine feet on ball K2C, trunk rotation, isometric abs  04/29/23 Assessed ROM cerv and shld and MMT, goals documented Nustep L 5 UBE L 2 2 min fwd/2 min backward Red tband extension 2 sets 10 Red tband rows 2 sets 10 Red tband ER 2 sets 10 Modified sit up with wt ball into chest press 2 sets 10 Wt ball OH press 10 x 3# standing shld flex,abd,upright row,chest press 10 x each STM with the Tgun to the upper traps bilaterally  04/15/23 Nustep level 5 x 5 minutes Gait outside around the parking [place], with SPC, CGA down slope and with curbs and did a little grass needing CGA Red tband extension Red tband rows Red tband ER 2# shrugs with upper trap and  levator stretches Weighted ball overhead lifts STM with the Tgun to the upper traps bilaterally  04/08/23 Nustep level 5 x 6 minutes Seated red tband Row Red tband extension Red tband ER Weighted ball overhead lifts 2 # shrugs Upper trap and levator stretches Cervical retraction STM to the right upper trap, rhomboid and neck with the Tgun  04/01/23 Seated shld row and shld ext red tband 2 sets 10 with postural cuing  Seated red tband shld diagnols 10 x Cerv retraction 15 x with head on ball on wall hold 3 sec Wall push ups with ball 10 x 5# standing shrugs ,backward rolls and shld ext 15 x each STW  and stretching to RT cerv and shld Theragun to RT cerv/trap area MH IFC RT cerv/shld in supine  03/25/23 STW  and stretching to RT cerv and shld Theragun to RT cerv/trap area Cerv retraction 15 x 3 sec hold Seated red tband row,shld ext and ER- cued for posture as she tends to hold head in fwd flex and off to left Standing 3#  shruggs,backward shld rolls and  shld ext 10 xeach MH IFC RT cerv/shld in supine  03/13/23 Eval                                                                                                                          PATIENT EDUCATION:  Education details: POC Person educated: Patient and Parent Education method: Explanation Education comprehension: verbalized understanding  HOME EXERCISE PROGRAM: Access Code: [email or web address] URL: [email or web address] Date: 04/01/2023 Prepared by: Ivet Guerrieri  Exercises - Seated Cervical Retraction  - 3 x daily - 7 x weekly - 1 sets - 10 reps - 3 hold - Seated Scapular Retraction  - 3 x daily - 7 x weekly - 1 sets - 10 reps - 3 hold - Seated Shoulder Shrug  - 3 x daily - 7 x weekly - 1 sets - 10 reps - 3 hold - Seated Shoulder Shrug Circles AROM Backward  - 3 x daily - 7 x weekly - 1 sets - 10 reps - 3 hold - Doorway Pec Stretch at 60 Elevation  - 3 x daily - 7 x weekly - 1 sets - 10 reps - 3-5 at various angles hold  ASSESSMENT:  CLINICAL IMPRESSION:  Overall pt states pain is 50% better but she still hurts  and is limited by pain with everyday activities. Assessed goals and progressing. Progressed overall ex to get pt moving and strengthened. STW with theragun to cerv and traps and upper back that she feels really relaxes everything OBJECTIVE IMPAIRMENTS: Abnormal gait, cardiopulmonary status limiting activity, decreased activity tolerance, decreased coordination, decreased endurance, decreased mobility, difficulty walking, decreased ROM, decreased strength, impaired perceived functional ability, increased muscle spasms, impaired flexibility, impaired UE functional use, improper body mechanics, postural dysfunction, and pain.   REHAB POTENTIAL: Good  CLINICAL DECISION MAKING: Stable/uncomplicated  EVALUATION COMPLEXITY: Low   GOALS: Goals reviewed with patient? Yes  SHORT TERM GOALS:  Target date: 04/04/23  Independent with initial  HEP Baseline:  Goal status: 04/01/23 MET  LONG TERM GOALS: Target date: 06/10/23  Independent with advanced HEP Baseline:  Goal status: 04/29/23 evolving, doing ex at her scholl too  2.  Understand posture and body mechanics Baseline:  Goal status: ongoing 04/08/23 and 04/29/23  05/13/23 progressing  3.  Increase cervical ROM 25% Baseline:  Goal status: met 05/06/23  4.  Report able to do hair and get dressed without pain > 4/10 Baseline:  Goal status:progressing 04/08/23 and 04/29/23 "sometimes"   05/13/23 progressing                      5.  Be able to lift and carry her bags, purse, shopping bags with no pain Baseline:  Goal status: progressing 04/15/23 and 04/29/23  05/13/23 ongoing   PLAN:  PT FREQUENCY: 1x/week  PT DURATION: 12 weeks  PLANNED INTERVENTIONS: 97164- PT Re-evaluation, 97110-Therapeutic exercises, 97530- Therapeutic activity, 97112- Neuromuscular re-education, 97535- Self Care, 16109- Manual therapy, 97014- Electrical stimulation (unattended), 97012- Traction (mechanical), Patient/Family education, Dry Needling, Joint mobilization, Cryotherapy, and Moist heat  PLAN FOR NEXT SESSION: continue to progress as tolerated   Amari Burnsworth,ANGIE, PTA 05/13/2023, 12:31 PM     [facility] [address] [place], [place], [address] Phone: [phone]   Fax:  [phone] [address] [place], [place], [address] Phone: [phone]   Fax:  [phone]  Patient Details  Name: Wanda Oneill MRN: [id number] Date of Birth: August 19, 1980 Referring Provider:  Monica Becton,*  Encounter Date: 05/13/2023   Suanne Marker, PTA 05/13/2023, 12:31 PM  [facility] [address] [place], [place], [address] Phone: [phone]   Fax:  [phone]

## 2023-05-20 ENCOUNTER — Ambulatory Visit: Admitting: Physical Therapy

## 2023-05-20 DIAGNOSIS — R252 Cramp and spasm: Secondary | ICD-10-CM | POA: Diagnosis not present

## 2023-05-20 DIAGNOSIS — M542 Cervicalgia: Secondary | ICD-10-CM | POA: Diagnosis not present

## 2023-05-20 DIAGNOSIS — M6281 Muscle weakness (generalized): Secondary | ICD-10-CM | POA: Diagnosis not present

## 2023-05-20 DIAGNOSIS — M25641 Stiffness of right hand, not elsewhere classified: Secondary | ICD-10-CM | POA: Diagnosis not present

## 2023-05-20 DIAGNOSIS — M25511 Pain in right shoulder: Secondary | ICD-10-CM | POA: Diagnosis not present

## 2023-05-20 DIAGNOSIS — R262 Difficulty in walking, not elsewhere classified: Secondary | ICD-10-CM | POA: Diagnosis not present

## 2023-05-20 NOTE — Therapy (Signed)
 OUTPATIENT PHYSICAL THERAPY CERVICAL    Patient Name: LYNDZIE ZENTZ MRN: [id number] DOB:1980/07/11, 43 y.o., female Today's Date: 05/20/2023  END OF SESSION:  PT End of Session - 05/20/23 1231     Visit Number 9    Number of Visits 13    Date for PT Re-Evaluation 06/14/23    Authorization Type [employer]    PT Start Time 1230    PT Stop Time 1315    PT Time Calculation (min) 45 min             Past Medical History:  Diagnosis Date   Ambulates with cane    4 prong   Diabetes mellitus    type II   Down syndrome    Down's syndrome    Family history of adverse reaction to anesthesia    mom has had n/v   Gastritis    Headache    Hepatic steatosis    Hidradenitis    Hypothyroidism    Irritable bowel syndrome    Osteoarthritis    Periumbilical hernia    Pneumonia 03/2020   frequent    Restless legs    Sleep apnea    Tendonitis    chronic in left foot   Thyroid disease    hypothyroidism   Urinary incontinence 02/2022   some   UTI (urinary tract infection)    just dx 04/2022 - rx amoxicillin   Past Surgical History:  Procedure Laterality Date   AMPUTATION Right 04/11/2022   Procedure: RIGHT LONG FINGER AMPUTATION;  Surgeon: Betha Loa, MD;  Location: [facility] OR;  Service: Orthopedics;  Laterality: Right;   AXILLARY HIDRADENITIS EXCISION Bilateral    CHOLECYSTECTOMY     HERNIA REPAIR     multiple   INCISION AND DRAINAGE ABSCESS Right 02/06/2022   Procedure: INCISION AND DRAINAGE RIGHT HAND;  Surgeon: Betha Loa, MD;  Location: [facility] OR;  Service: Orthopedics;  Laterality: Right;   INCISION AND DRAINAGE WOUND WITH NAILBED REPAIR Left 02/06/2022   Procedure: INCISION AND DRAINAGE LEFT INDEX FINGER;  Surgeon: Betha Loa, MD;  Location: [facility] OR;  Service: Orthopedics;  Laterality: Left;   INCISIONAL HERNIA REPAIR N/A 05/30/2015   Procedure: LAPAROSCOPIC INCISIONAL HERNIA;  Surgeon: Emelia Loron, MD;  Location: [facility] OR;  Service: General;  Laterality: N/A;   INSERTION  OF MESH N/A 05/30/2015   Procedure: INSERTION OF MESH;  Surgeon: Emelia Loron, MD;  Location: [facility] OR;  Service: General;  Laterality: N/A;   KNEE ARTHROSCOPY     right knee   LAPAROSCOPY N/A 05/30/2015   Procedure: LAPAROSCOPY DIAGNOSTIC;  Surgeon: Emelia Loron, MD;  Location: [facility] OR;  Service: General;  Laterality: N/A;   PARTIAL KNEE ARTHROPLASTY Right 10/30/2017   Procedure: UNICOMPARTMENTAL RIGHT KNEE LATERAL;  Surgeon: Durene Romans, MD;  Location: [facility] ORS;  Service: Orthopedics;  Laterality: Right;  90 mins   PARTIAL KNEE ARTHROPLASTY Left 06/29/2020   Procedure: UNICOMPARTMENTAL KNEE LATERALLY;  Surgeon: Durene Romans, MD;  Location: [facility] ORS;  Service: Orthopedics;  Laterality: Left;  70 MINS   TONSILLECTOMY     adenoids   Patient Active Problem List   Diagnosis Date Noted   OSA on CPAP 12/12/2022   DOE (dyspnea on exertion) 08/21/2022   Furuncles 02/08/2022   Infection of flexor tendon sheath 02/06/2022   Finger osteomyelitis, right (HCC) 02/06/2022   Leukocytosis 02/06/2022   History of COVID-19 02/06/2022   IBS (irritable bowel syndrome) 02/06/2022   Hypertriglyceridemia 02/06/2022   Obesity, Class III, BMI 40-49.9 (morbid  obesity) (HCC) 02/06/2022   Lumbar facet joint syndrome 11/02/2021   Bilateral trigger thumb 06/18/2021   Chronic paronychia of finger 01/25/2021   S/P left unicompartmental knee replacement, lateral 06/29/2020   At risk for obstructive sleep apnea 06/11/2019   Cough 06/11/2019   Hyperglycemia 05/26/2019   Acute hypoxemic respiratory failure (HCC) 05/20/2019   Fibromyalgia 03/09/2019   Neck pain, chronic 03/09/2019   Acute shoulder bursitis, right 02/03/2019   Bile salt-induced diarrhea 09/25/2018   Arthritis of left acromioclavicular joint 09/23/2018   Precordial chest pain 08/11/2018   Morbid obesity (HCC) 10/31/2017   S/P right UKR, lateral 10/30/2017   Incarcerated epigastric hernia 05/30/2015   Left foot pain 08/09/2014   Chronic  diarrhea 07/27/2014   Gout 08/10/2012   Acid reflux 01/22/2012   Down syndrome 11/11/2011   Hernia of abdominal wall 10/21/2011   Psoriasis 10/21/2011   Uncontrolled type 2 diabetes mellitus with hyperglycemia, with long-term current use of insulin (HCC) 04/12/2011   Hypothyroid 04/12/2011   Gait abnormality 06/22/2010   Tibial tendinitis, posterior 06/22/2010    PCP: Copland MD  REFERRING PROVIDER: Sandy Crumb, MD  REFERRING DIAG: neck pain  THERAPY DIAG:  Cervicalgia  Cramp and spasm  Rationale for Evaluation and Treatment: Rehabilitation  ONSET DATE: 03/03/23  SUBJECTIVE:                                                                                                                                                                                                         SUBJECTIVE STATEMENT: about 50% better overalll, but ready for D/C I think  PERTINENT HISTORY:  See above  PAIN:  Are you having pain? Sore ,neck and arm  PRECAUTIONS: None  RED FLAGS: None     WEIGHT BEARING RESTRICTIONS: No  FALLS:  Has patient fallen in last 6 months? No  LIVING ENVIRONMENT: Lives with: lives alone Lives in: House/apartment Stairs: No Has following equipment at home: Single point cane  OCCUPATION: none  PLOF: Independent and school 3 x week, cooking shopping  PATIENT GOALS: have less pain, sleep better  NEXT MD VISIT: none scheduled  OBJECTIVE:  Note: Objective measures were completed at Evaluation unless otherwise noted.  DIAGNOSTIC FINDINGS:  IMPRESSION: No acute displaced fracture or traumatic listhesis of the cervical spine. Limited evaluation due to overlapping osseous structures and overlying soft tissues.    PATIENT SURVEYS:  NDI 30/50 = 60%  COGNITION: Overall cognitive status: Within functional limits for tasks assessed  SENSATION: WFL  POSTURE: rounded shoulders, forward head, decreased lumbar lordosis, and increased thoracic  kyphosis  PALPATION: C/o some tenderness in the neck mms, the right upper trap and in the right shoulder and upper arm area   CERVICAL ROM:   Active ROM A/PROM (deg) eval AROM sitting 04/29/23  Flexion Decreased 25% WFLS  Extension Decreased 75% WFLS  Right lateral flexion Decreased 75% Decreased 25%  Left lateral flexion Decreased 75% Decreased 25%  Right rotation Decreased 50% WFLS  Left rotation Decreased 50% Decreased 25%   (Blank rows = not tested)  UPPER EXTREMITY ROM:  Active ROM Right AROM eval Left eval RT AROM sitting  Shoulder flexion 150  165  Shoulder extension     Shoulder abduction 130  160  Shoulder adduction     Shoulder extension     Shoulder internal rotation 40  65  Shoulder external rotation 60  70  Elbow flexion     Elbow extension     Wrist flexion     Wrist extension     Wrist ulnar deviation     Wrist radial deviation     Wrist pronation     Wrist supination      (Blank rows = not tested)  UPPER EXTREMITY MMT:  MMT Right eval Left eval RT/Left 04/29/23  Shoulder flexion 4- 4- 4/4+  Shoulder extension     Shoulder abduction 3+ 4- 4+/4+  Shoulder adduction     Shoulder extension     Shoulder internal rotation 3+ 4 4/4+  Shoulder external rotation 3+ 4 4/4+  Middle trapezius     Lower trapezius     Elbow flexion     Elbow extension     Wrist flexion     Wrist extension     Wrist ulnar deviation     Wrist radial deviation     Wrist pronation     Wrist supination     Grip strength      (Blank rows = not tested)  CERVICAL SPECIAL TESTS:  None tested  FUNCTIONAL TESTS:    TREATMENT DATE:   05/20/23 Walk outside paved surface at good apce and no LOB but needs HHA for curb to steady more d/t fear Seated red tband Row 2 sets 10 Red tband extension 2 sets 10 Red tband ER 2 sets 10 Weighted ball overhead lifts 10 x then chest press 10 x Black tband trunk flex and ext 2 sets 10 Theragun to cerv and thoracic and educ mom on gun  for home purchase  05/13/23 Nustep L 5 Seated red tband Row 2 sets 10 Red tband extension 2 sets 10 Red tband ER 2 sets 10 Weighted ball overhead lifts 10 x then chest press 10 x Black tband trunk flex and ext 2 sets 10 Amb with HHA in clinic working on stride and cadeance STM to the upper traps and into the cervical area- with and without theragun     05/06/23 Nustep level 5 x 6 minutes Gait outside around the back parking island at times [facility] for a little instability Red tband row and extension STM to the upper traps and into the cervical area Supine feet on ball K2C, trunk rotation, isometric abs  04/29/23 Assessed ROM cerv and shld and MMT, goals documented Nustep L 5 UBE L 2 2 min fwd/2 min backward Red tband extension 2 sets 10 Red tband rows 2 sets 10 Red tband ER 2 sets 10 Modified sit up with wt ball into chest press 2 sets 10 Wt ball OH press 10 x 3# standing shld flex,abd,upright  row,chest press 10 x each STM with the Tgun to the upper traps bilaterally  04/15/23 Nustep level 5 x 5 minutes Gait outside around the parking [place], with SPC, CGA down slope and with curbs and did a little grass needing CGA Red tband extension Red tband rows Red tband ER 2# shrugs with upper trap and levator stretches Weighted ball overhead lifts STM with the Tgun to the upper traps bilaterally  04/08/23 Nustep level 5 x 6 minutes Seated red tband Row Red tband extension Red tband ER Weighted ball overhead lifts 2 # shrugs Upper trap and levator stretches Cervical retraction STM to the right upper trap, rhomboid and neck with the Tgun  04/01/23 Seated shld row and shld ext red tband 2 sets 10 with postural cuing  Seated red tband shld diagnols 10 x Cerv retraction 15 x with head on ball on wall hold 3 sec Wall push ups with ball 10 x 5# standing shrugs ,backward rolls and shld ext 15 x each STW  and stretching to RT cerv and shld Theragun to RT cerv/trap area MH  IFC RT cerv/shld in supine  03/25/23 STW  and stretching to RT cerv and shld Theragun to RT cerv/trap area Cerv retraction 15 x 3 sec hold Seated red tband row,shld ext and ER- cued for posture as she tends to hold head in fwd flex and off to left Standing 3# shruggs,backward shld rolls and  shld ext 10 xeach MH IFC RT cerv/shld in supine  03/13/23 Eval                                                                                                                          PATIENT EDUCATION:  Education details: POC Person educated: Patient and Parent Education method: Explanation Education comprehension: verbalized understanding  HOME EXERCISE PROGRAM: Access Code: [email or web address] URL: [email or web address] Date: 04/01/2023 Prepared by: Japneet Staggs  Exercises - Seated Cervical Retraction  - 3 x daily - 7 x weekly - 1 sets - 10 reps - 3 hold - Seated Scapular Retraction  - 3 x daily - 7 x weekly - 1 sets - 10 reps - 3 hold - Seated Shoulder Shrug  - 3 x daily - 7 x weekly - 1 sets - 10 reps - 3 hold - Seated Shoulder Shrug Circles AROM Backward  - 3 x daily - 7 x weekly - 1 sets - 10 reps - 3 hold - Doorway Pec Stretch at 60 Elevation  - 3 x daily - 7 x weekly - 1 sets - 10 reps - 3-5 at various angles hold  ASSESSMENT:  CLINICAL IMPRESSION:  Overall pt states pain is 50% better but she still hurts  and is limited by pain with everyday activities but it does vary and at this time she feels she is ready for D/C.Assessed goals  Progressed overall ex to get pt moving and strengthened. STW with theragun to cerv and traps and upper back that she feels really  relaxes everything, educ mom on purchasing theragun OBJECTIVE IMPAIRMENTS: Abnormal gait, cardiopulmonary status limiting activity, decreased activity tolerance, decreased coordination, decreased endurance, decreased mobility, difficulty walking, decreased ROM, decreased strength, impaired perceived functional ability,  increased muscle spasms, impaired flexibility, impaired UE functional use, improper body mechanics, postural dysfunction, and pain.   REHAB POTENTIAL: Good  CLINICAL DECISION MAKING: Stable/uncomplicated  EVALUATION COMPLEXITY: Low   GOALS: Goals reviewed with patient? Yes  SHORT TERM GOALS: Target date: 04/04/23  Independent with initial HEP Baseline:  Goal status: 04/01/23 MET  LONG TERM GOALS: Target date: 06/10/23  Independent with advanced HEP Baseline:  Goal status: 04/29/23 evolving, doing ex at her scholl too  05/20/23 MET  2.  Understand posture and body mechanics Baseline:  Goal status: ongoing 04/08/23 and 04/29/23  05/13/23 progressing  05/20/23 VU but difficult to maintain  3.  Increase cervical ROM 25% Baseline:  Goal status: met 05/06/23  4.  Report able to do hair and get dressed without pain > 4/10 Baseline:  Goal status:progressing 04/08/23 and 04/29/23 "sometimes"   05/13/23 progressing  05/20/23 MET                      5.  Be able to lift and carry her bags, purse, shopping bags with no pain Baseline:  Goal status: progressing 04/15/23 and 04/29/23  05/13/23 ongoing and 05/20/23   PLAN:  PT FREQUENCY: 1x/week  PT DURATION: 12 weeks  PLANNED INTERVENTIONS: 97164- PT Re-evaluation, 97110-Therapeutic exercises, 97530- Therapeutic activity, 97112- Neuromuscular re-education, 97535- Self Care, 57846- Manual therapy, 97014- Electrical stimulation (unattended), 97012- Traction (mechanical), Patient/Family education, Dry Needling, Joint mobilization, Cryotherapy, and Moist heat  PLAN FOR NEXT SESSION: D/C  PHYSICAL THERAPY DISCHARGE SUMMARY   Patient agrees to discharge. Patient goals were partially met. Patient is being discharged due to being pleased with the current functional level.    Bartlett Enke,ANGIE, PTA 05/20/2023, 12:48 PM     [facility] at [facility] [address] [place], [place], [address] Phone: [phone]    Fax:  [phone] at [facility] [address] [place], [place], [address] Phone: [phone]   Fax:  [phone]  Patient Details  Name: DANEL STUDZINSKI MRN: [id number] Date of Birth: 1980-08-28 Referring Provider:  Gean Keels,*  Encounter Date: 05/20/2023   Aquilla Bayley, PTA 05/20/2023, 12:48 PM  [place] at [facility] [address] [place], [place], [address] Phone: [phone]   Fax:  [phone] at [facility] [address] [place], [place], [address] Phone: [phone]   Fax:  [phone]  Patient Details  Name: DONNICE NIELSEN MRN: [id number] Date of Birth: 11/18/1980 Referring Provider:  Gean Keels

## 2023-06-05 DIAGNOSIS — L03012 Cellulitis of left finger: Secondary | ICD-10-CM | POA: Diagnosis not present

## 2023-06-14 ENCOUNTER — Other Ambulatory Visit: Payer: Self-pay | Admitting: Family Medicine

## 2023-06-23 DIAGNOSIS — E1165 Type 2 diabetes mellitus with hyperglycemia: Secondary | ICD-10-CM | POA: Diagnosis not present

## 2023-07-20 ENCOUNTER — Other Ambulatory Visit: Payer: Self-pay | Admitting: Family Medicine

## 2023-07-20 MED ORDER — LEVOFLOXACIN 750 MG PO TABS: 750.0000 mg | ORAL_TABLET | Freq: Every day | ORAL | 0 refills | Status: DC

## 2023-07-20 NOTE — Progress Notes (Signed)
 I received a message from her mom that Wanda Oneill has been coughing for 10 days like her mom and dad. They request levaquin  as she tolerates it well and it has worked for her in the past.  Sent in rx for her  They are aware of possible tendon rupture risk   Meds ordered this encounter  Medications   levofloxacin  (LEVAQUIN ) 750 MG tablet    Sig: Take 1 tablet (750 mg total) by mouth daily.    Dispense:  5 tablet    Refill:  0

## 2023-07-24 ENCOUNTER — Other Ambulatory Visit (HOSPITAL_BASED_OUTPATIENT_CLINIC_OR_DEPARTMENT_OTHER): Payer: Self-pay | Admitting: Obstetrics & Gynecology

## 2023-07-24 DIAGNOSIS — E781 Pure hyperglyceridemia: Secondary | ICD-10-CM | POA: Diagnosis not present

## 2023-07-24 DIAGNOSIS — E1165 Type 2 diabetes mellitus with hyperglycemia: Secondary | ICD-10-CM | POA: Diagnosis not present

## 2023-07-24 DIAGNOSIS — N926 Irregular menstruation, unspecified: Secondary | ICD-10-CM

## 2023-07-24 DIAGNOSIS — E010 Iodine-deficiency related diffuse (endemic) goiter: Secondary | ICD-10-CM | POA: Diagnosis not present

## 2023-07-24 DIAGNOSIS — E039 Hypothyroidism, unspecified: Secondary | ICD-10-CM | POA: Diagnosis not present

## 2023-07-24 NOTE — Telephone Encounter (Signed)
 Reviewed with Dr Annabell Key.  She has not seen the patient in 2 years this summer.  Dr Annabell Key advised refill is appropriate and needs to be set up for a visit to see her.  Apt scheduled for 10/02/23.  Refill sent in.  [facility]

## 2023-07-29 DIAGNOSIS — E119 Type 2 diabetes mellitus without complications: Secondary | ICD-10-CM | POA: Diagnosis not present

## 2023-07-31 DIAGNOSIS — E781 Pure hyperglyceridemia: Secondary | ICD-10-CM | POA: Diagnosis not present

## 2023-07-31 DIAGNOSIS — E039 Hypothyroidism, unspecified: Secondary | ICD-10-CM | POA: Diagnosis not present

## 2023-07-31 DIAGNOSIS — E1165 Type 2 diabetes mellitus with hyperglycemia: Secondary | ICD-10-CM | POA: Diagnosis not present

## 2023-08-04 DIAGNOSIS — G4733 Obstructive sleep apnea (adult) (pediatric): Secondary | ICD-10-CM | POA: Diagnosis not present

## 2023-08-04 DIAGNOSIS — R0609 Other forms of dyspnea: Secondary | ICD-10-CM | POA: Diagnosis not present

## 2023-08-04 DIAGNOSIS — J4550 Severe persistent asthma, uncomplicated: Secondary | ICD-10-CM | POA: Diagnosis not present

## 2023-08-04 DIAGNOSIS — Z6841 Body Mass Index (BMI) 40.0 and over, adult: Secondary | ICD-10-CM | POA: Diagnosis not present

## 2023-08-04 DIAGNOSIS — J219 Acute bronchiolitis, unspecified: Secondary | ICD-10-CM | POA: Diagnosis not present

## 2023-08-05 DIAGNOSIS — D485 Neoplasm of uncertain behavior of skin: Secondary | ICD-10-CM | POA: Diagnosis not present

## 2023-08-20 DIAGNOSIS — E119 Type 2 diabetes mellitus without complications: Secondary | ICD-10-CM | POA: Diagnosis not present

## 2023-08-20 DIAGNOSIS — H43823 Vitreomacular adhesion, bilateral: Secondary | ICD-10-CM | POA: Diagnosis not present

## 2023-08-20 DIAGNOSIS — H35431 Paving stone degeneration of retina, right eye: Secondary | ICD-10-CM | POA: Diagnosis not present

## 2023-08-20 DIAGNOSIS — H2513 Age-related nuclear cataract, bilateral: Secondary | ICD-10-CM | POA: Diagnosis not present

## 2023-09-01 ENCOUNTER — Other Ambulatory Visit: Payer: Self-pay | Admitting: Family Medicine

## 2023-09-01 MED ORDER — DICYCLOMINE HCL 20 MG PO TABS: 20.0000 mg | ORAL_TABLET | Freq: Three times a day (TID) | ORAL | 0 refills | Status: DC | PRN

## 2023-09-11 NOTE — Telephone Encounter (Signed)
 SABRA

## 2023-09-16 ENCOUNTER — Other Ambulatory Visit: Payer: Self-pay | Admitting: Family Medicine

## 2023-09-16 MED ORDER — ALLOPURINOL 300 MG PO TABS: 300.0000 mg | ORAL_TABLET | Freq: Every day | ORAL | 3 refills | Status: AC

## 2023-09-17 DIAGNOSIS — E1165 Type 2 diabetes mellitus with hyperglycemia: Secondary | ICD-10-CM | POA: Diagnosis not present

## 2023-09-21 DIAGNOSIS — E1165 Type 2 diabetes mellitus with hyperglycemia: Secondary | ICD-10-CM | POA: Diagnosis not present

## 2023-09-22 ENCOUNTER — Encounter: Payer: Self-pay | Admitting: Pharmacist

## 2023-09-22 DIAGNOSIS — E781 Pure hyperglyceridemia: Secondary | ICD-10-CM | POA: Diagnosis not present

## 2023-09-22 DIAGNOSIS — E7800 Pure hypercholesterolemia, unspecified: Secondary | ICD-10-CM | POA: Diagnosis not present

## 2023-09-22 DIAGNOSIS — E1165 Type 2 diabetes mellitus with hyperglycemia: Secondary | ICD-10-CM | POA: Diagnosis not present

## 2023-09-22 DIAGNOSIS — E039 Hypothyroidism, unspecified: Secondary | ICD-10-CM | POA: Diagnosis not present

## 2023-09-22 DIAGNOSIS — E010 Iodine-deficiency related diffuse (endemic) goiter: Secondary | ICD-10-CM | POA: Diagnosis not present

## 2023-09-22 NOTE — Progress Notes (Signed)
 Pharmacy Quality Measure Review  Statin Use in Persons with Diabetes (SUPD) I received a message from clinical pharmacist that works with Dr Tommas who is patient's endocrinologist that patient is not taking a statin. They recently checked labs in June 2025.   A1c was 10.7%    Patient is taking fenofibrate  134mg  daily for elevated Triglycerides.  Cholestyramine  is prescribed for bile acid deficient diarrhea but can lower LDL.   Dr Watt did address statin therapy in a MyChart message in 2024.  Currently has no follow up scheduled with PCP. Last office visit was 11/2022.   PREVENT Risk Score:  - 10 year risk of CVD: 10.5% - 10 year risk of ASCVD: 7.2% - 10 year risk of HF: 4.4%   Microalbumin/Creatinine, Random Urine Sample Reviewed date:09/20/2023 05:29:16 AM Interpretation: Performing Oja:Ojarnme [place], [address], [place], [place] [id number], Phone - [phone], Director - MDNagendra Notes/Report:  Creatinine, Urine 87.1 Not Estab. mg/dL    Albumin, Urine 319.8 Not Estab. ug/mL Results confirmed on  dilution.   Alb/Creat Ratio 781 0-29 mg/g creat Normal: 0 - 29  Moderately increased: 30 - 300  Severely increased: >30     Plan:  Uncontrolled type 2 DM managed by endocrinology - patient was seen by Dr Tommas 08/26/2023. Asked for her office to send copy of last notes. Will check to see if there is a SGLT2 on her list from Dr Tommas since we previously had Jardiance  listed. UACR was elevated 09/20/2023 but last eGFR was 107. Recommend recheck UACR in 3 months and consider SGLT2 if not taking and UACR is elevated.   Hyperlipidemia - elevated LDL and triglycerides, low hyperlipidemia - consider initiation of statin therapy  Will send message to Dr Watt about statin use in diabetic and see when she would recommend follow up visit.   Madelin Ray, PharmD Clinical Pharmacist [facility]

## 2023-09-26 NOTE — Progress Notes (Signed)
 Will reach out to her mom about this

## 2023-09-27 ENCOUNTER — Other Ambulatory Visit: Payer: Self-pay | Admitting: Family Medicine

## 2023-09-27 DIAGNOSIS — E118 Type 2 diabetes mellitus with unspecified complications: Secondary | ICD-10-CM

## 2023-09-28 ENCOUNTER — Other Ambulatory Visit: Payer: Self-pay | Admitting: Family Medicine

## 2023-10-02 ENCOUNTER — Ambulatory Visit (HOSPITAL_BASED_OUTPATIENT_CLINIC_OR_DEPARTMENT_OTHER): Admitting: Obstetrics & Gynecology

## 2023-10-07 ENCOUNTER — Encounter: Payer: Self-pay | Admitting: Sports Medicine

## 2023-10-09 ENCOUNTER — Other Ambulatory Visit (HOSPITAL_BASED_OUTPATIENT_CLINIC_OR_DEPARTMENT_OTHER): Payer: Self-pay | Admitting: Obstetrics & Gynecology

## 2023-10-09 DIAGNOSIS — N926 Irregular menstruation, unspecified: Secondary | ICD-10-CM

## 2023-10-14 ENCOUNTER — Ambulatory Visit

## 2023-10-23 ENCOUNTER — Other Ambulatory Visit: Payer: Self-pay | Admitting: Internal Medicine

## 2023-10-23 ENCOUNTER — Other Ambulatory Visit: Payer: Self-pay | Admitting: Family Medicine

## 2023-10-30 ENCOUNTER — Other Ambulatory Visit: Payer: Self-pay | Admitting: Family Medicine

## 2023-10-30 DIAGNOSIS — M869 Osteomyelitis, unspecified: Secondary | ICD-10-CM

## 2023-11-10 NOTE — Therapy (Signed)
 OUTPATIENT OCCUPATIONAL THERAPY ORTHO EVALUATION  Patient Name: Wanda Oneill MRN: [id number] DOB:Nov 29, 1980, 43 y.o., female Today's Date: 11/13/2023  PCP: Dr. Harlene Copland REFERRING PROVIDERDr. Harlene Copland  END OF SESSION:  OT End of Session - 11/13/23 1408     Visit Number 1    Number of Visits 12    Date for Recertification  02/05/24    Authorization Type [employer]    Authorization - Visit Number 1    Progress Note Due on Visit 10    OT Start Time 1405    OT Stop Time 1445    OT Time Calculation (min) 40 min    Activity Tolerance Patient tolerated treatment well    Behavior During Therapy WFL for tasks assessed/performed          Past Medical History:  Diagnosis Date   Ambulates with cane    4 prong   Diabetes mellitus    type II   Down syndrome    Down's syndrome    Family history of adverse reaction to anesthesia    mom has had n/v   Gastritis    Headache    Hepatic steatosis    Hidradenitis    Hypothyroidism    Irritable bowel syndrome    Osteoarthritis    Periumbilical hernia    Pneumonia 03/2020   frequent    Restless legs    Sleep apnea    Tendonitis    chronic in left foot   Thyroid  disease    hypothyroidism   Urinary incontinence 02/2022   some   UTI (urinary tract infection)    just dx 04/2022 - rx amoxicillin    Past Surgical History:  Procedure Laterality Date   AMPUTATION Right 04/11/2022   Procedure: RIGHT LONG FINGER AMPUTATION;  Surgeon: Murrell Drivers, MD;  Location: [facility] OR;  Service: Orthopedics;  Laterality: Right;   AXILLARY HIDRADENITIS EXCISION Bilateral    CHOLECYSTECTOMY     HERNIA REPAIR     multiple   INCISION AND DRAINAGE ABSCESS Right 02/06/2022   Procedure: INCISION AND DRAINAGE RIGHT HAND;  Surgeon: Murrell Drivers, MD;  Location: [facility] OR;  Service: Orthopedics;  Laterality: Right;   INCISION AND DRAINAGE WOUND WITH NAILBED REPAIR Left 02/06/2022   Procedure: INCISION AND DRAINAGE LEFT INDEX FINGER;  Surgeon:  Murrell Drivers, MD;  Location: [facility] OR;  Service: Orthopedics;  Laterality: Left;   INCISIONAL HERNIA REPAIR N/A 05/30/2015   Procedure: LAPAROSCOPIC INCISIONAL HERNIA;  Surgeon: Donnice Bury, MD;  Location: [facility] OR;  Service: General;  Laterality: N/A;   INSERTION OF MESH N/A 05/30/2015   Procedure: INSERTION OF MESH;  Surgeon: Donnice Bury, MD;  Location: [facility] OR;  Service: General;  Laterality: N/A;   KNEE ARTHROSCOPY     right knee   LAPAROSCOPY N/A 05/30/2015   Procedure: LAPAROSCOPY DIAGNOSTIC;  Surgeon: Donnice Bury, MD;  Location: [facility] OR;  Service: General;  Laterality: N/A;   PARTIAL KNEE ARTHROPLASTY Right 10/30/2017   Procedure: UNICOMPARTMENTAL RIGHT KNEE LATERAL;  Surgeon: Ernie Donnice, MD;  Location: [facility] ORS;  Service: Orthopedics;  Laterality: Right;  90 mins   PARTIAL KNEE ARTHROPLASTY Left 06/29/2020   Procedure: UNICOMPARTMENTAL KNEE LATERALLY;  Surgeon: Ernie Donnice, MD;  Location: [facility] ORS;  Service: Orthopedics;  Laterality: Left;  70 MINS   TONSILLECTOMY     adenoids   Patient Active Problem List   Diagnosis Date Noted   OSA on CPAP 12/12/2022   DOE (dyspnea on exertion) 08/21/2022   Furuncles 02/08/2022  Infection of flexor tendon sheath 02/06/2022   Finger osteomyelitis, right (HCC) 02/06/2022   Leukocytosis 02/06/2022   History of COVID-19 02/06/2022   IBS (irritable bowel syndrome) 02/06/2022   Hypertriglyceridemia 02/06/2022   Obesity, Class III, BMI 40-49.9 (morbid obesity) (HCC) 02/06/2022   Lumbar facet joint syndrome 11/02/2021   Bilateral trigger thumb 06/18/2021   Chronic paronychia of finger 01/25/2021   S/P left unicompartmental knee replacement, lateral 06/29/2020   At risk for obstructive sleep apnea 06/11/2019   Cough 06/11/2019   Hyperglycemia 05/26/2019   Acute hypoxemic respiratory failure (HCC) 05/20/2019   Fibromyalgia 03/09/2019   Neck pain, chronic 03/09/2019   Acute shoulder bursitis, right 02/03/2019   Bile salt-induced  diarrhea 09/25/2018   Arthritis of left acromioclavicular joint 09/23/2018   Precordial chest pain 08/11/2018   Morbid obesity (HCC) 10/31/2017   S/P right UKR, lateral 10/30/2017   Incarcerated epigastric hernia 05/30/2015   Left foot pain 08/09/2014   Chronic diarrhea 07/27/2014   Gout 08/10/2012   Acid reflux 01/22/2012   Down syndrome 11/11/2011   Hernia of abdominal wall 10/21/2011   Psoriasis 10/21/2011   Uncontrolled type 2 diabetes mellitus with hyperglycemia, with long-term current use of insulin  (HCC) 04/12/2011   Hypothyroid 04/12/2011   Gait abnormality 06/22/2010   Tibial tendinitis, posterior 06/22/2010    ONSET DATE: 10/30/23- referral date  REFERRING DIAG:  Diagnosis  M86.9 (ICD-10-CM) - Finger osteomyelitis, right (HCC)    THERAPY DIAG:  Stiffness of right hand, not elsewhere classified - Plan: Ot plan of care cert/re-cert  Muscle weakness (generalized) - Plan: Ot plan of care cert/re-cert  Other disturbances of skin sensation - Plan: Ot plan of care cert/re-cert  Other lack of coordination - Plan: Ot plan of care cert/re-cert  Rationale for Evaluation and Treatment: Rehabilitation  SUBJECTIVE:   SUBJECTIVE STATEMENT: Pt reports she is still having trouble using her right middle finger Pt accompanied by: self  PERTINENT HISTORY: hx of R long finger amputation through distal phalanx 04/11/22, healing wound at the edge of nail left long finger which is now closed PMH: Down's syndrome, DM   PRECAUTIONS: None  RED FLAGS: None   WEIGHT BEARING RESTRICTIONS: No  PAIN:  Are you having pain? Yes: NPRS scale: 4-6/10 Pain location: right middle finger Pain description: aching Aggravating factors: use, weather changes Relieving factors: rest  FALLS: Has patient fallen in last 6 months? Yes. Number of falls 1- PT addessed  LIVING ENVIRONMENT: Lives with: lives with their family and lives alone Lives in: House/apartment   PLOF: Independent with basic  ADLs  PATIENT GOALS: improve use of right middle finger  NEXT MD VISIT: unknown  OBJECTIVE:  Note: Objective measures were completed at Evaluation unless otherwise noted.  HAND DOMINANCE: Right  ADLs: Overall ADLs: mod I with all basic ADLS  FUNCTIONAL OUTCOME MEASURES: PSFS-5.3 Items-Pick up pills 5, cut food, 6, stirring batter and using RUE for baking 5  UPPER EXTREMITY ROM:      Active ROM Right eval   Thumb MCP (0-60)    Thumb IP (0-80)    Thumb Radial abd/add (0-55)    Thumb Palmar abd/add (0-45)     Thumb Opposition to Small Finger     Index MCP (0-90)     Index PIP (0-100)     Index DIP (0-70)      Long MCP (0-90) 80     Long PIP (0-100) 70     Long DIP (0-70)      Ring  MCP (0-90)      Ring PIP (0-100)      Ring DIP (0-70)      Little MCP (0-90)      Little PIP (0-100)      Little DIP (0-70)      (Blank rows = not tested)   HAND FUNCTION: Grip strength: Right: 20 lbs; Left: 34 lbs  COORDINATION: 9 Hole Peg test: Right: 1 min 4 secs sec; Left: 1 min 8  sec   SENSATION: Light touch: Impaired in fingertip   EDEMA: Pt with R middle finger amputation at distal phalanx  COGNITION: Overall cognitive status: History of cognitive impairments - at baseline, mild impairment   OBSERVATIONS: Pleasant female    TREATMENT DATE: 11/13/23- inital eval                                                                                                                                 PATIENT EDUCATION: Education details: role of OT, potential goals, inital HEP Person educated: Patient Education method: Explanation, demonstration, v.c Education comprehension: verbalized understanding, returned demonstraion, needs review  HOME EXERCISE PROGRAM: inital HEP 11/13/23  GOALS: Goals reviewed with patient? Yes  SHORT TERM GOALS: Target date: 12/13/23  I with inital HEP Baseline: Goal status: INITIAL  2.  Pt will increase RUE middle finger PIP flexion to  75* for increased functional use. Baseline:  Goal status: INITIAL  3.  Pt will improve RUE grip strength to 25# or better for increased functional use. Baseline: 20# Goal status: INITIAL  4.  I with adapted strategies and adpted equipment to maximize pt's safety and I with ADLs/IADLs.   Goal status: INITIAL    LONG TERM GOALS: Target date: 02/05/24  I with updated HEP  Goal status: INITIAL  2.  Pt will demonstrate improved RUE functional use as evidenced by increasing PSFS to 7. Baseline: 5.3 Goal status: INITIAL  3.  I with R fingertip protector splint wear/ care and prec.  Baseline: dependnent Goal status: INITIAL    ASSESSMENT:  CLINICAL IMPRESSION: Patient is a 43 y.o. female who was seen today for occupational therapy evaluation for M86.9 (ICD-10-CM) - Finger osteomyelitis, right (HCC). Pt had a fingertip amputation for R middle finger in March of 2024 as a result of infection. Pt reports continued residual limitations as a result. Pt presents with the following performance deficits below, she can benefit from skilled occupational therapy to address these deficits in order to maximize pt's safety and I with ADLs/IADLs.  PERFORMANCE DEFICITS: in functional skills including ADLs, IADLs, coordination, dexterity, sensation, edema, ROM, strength, pain, flexibility, Fine motor control, Gross motor control, decreased knowledge of precautions, decreased knowledge of use of DME, and UE functional use, cognitive skills including problem solving, thought, and understand, and psychosocial skills including coping strategies, environmental adaptation, habits, interpersonal interactions, and routines and behaviors.   IMPAIRMENTS: are limiting patient from ADLs, IADLs, play, leisure, and social participation.  COMORBIDITIES: may have co-morbidities  that affects occupational performance. Patient will benefit from skilled OT to address above impairments and improve overall  function.  MODIFICATION OR ASSISTANCE TO COMPLETE EVALUATION: No modification of tasks or assist necessary to complete an evaluation.  OT OCCUPATIONAL PROFILE AND HISTORY: Detailed assessment: Review of records and additional review of physical, cognitive, psychosocial history related to current functional performance.  CLINICAL DECISION MAKING: LOW - limited treatment options, no task modification necessary  REHAB POTENTIAL: Good  EVALUATION COMPLEXITY: Low      PLAN:  OT FREQUENCY: 1x/week  OT DURATION: 12 weeks  PLANNED INTERVENTIONS: 97168 OT Re-evaluation, 97535 self care/ADL training, 02889 therapeutic exercise, 97530 therapeutic activity, 97112 neuromuscular re-education, 97140 manual therapy, 97035 ultrasound, 97018 paraffin, 02989 moist heat, 97010 cryotherapy, 97014 electrical stimulation unattended, 97760 Orthotic Initial, 97763 Orthotic/Prosthetic subsequent, scar mobilization, passive range of motion, energy conservation, coping strategies training, patient/family education, and DME and/or AE instructions  RECOMMENDED OTHER SERVICES: n/a  CONSULTED AND AGREED WITH PLAN OF CARE:   PLAN FOR NEXT SESSION: progress HEP, functional use of RUE   Tzipporah Nagorski, OT 11/13/2023, 3:00 PM

## 2023-11-11 ENCOUNTER — Other Ambulatory Visit (HOSPITAL_BASED_OUTPATIENT_CLINIC_OR_DEPARTMENT_OTHER): Payer: Self-pay

## 2023-11-11 DIAGNOSIS — N926 Irregular menstruation, unspecified: Secondary | ICD-10-CM

## 2023-11-11 MED ORDER — NORETHINDRONE ACETATE 5 MG PO TABS: ORAL_TABLET | ORAL | 0 refills | Status: DC

## 2023-11-13 ENCOUNTER — Encounter: Payer: Self-pay | Admitting: Occupational Therapy

## 2023-11-13 ENCOUNTER — Ambulatory Visit: Attending: Family Medicine | Admitting: Occupational Therapy

## 2023-11-13 DIAGNOSIS — R278 Other lack of coordination: Secondary | ICD-10-CM | POA: Insufficient documentation

## 2023-11-13 DIAGNOSIS — M25642 Stiffness of left hand, not elsewhere classified: Secondary | ICD-10-CM | POA: Diagnosis not present

## 2023-11-13 DIAGNOSIS — M6281 Muscle weakness (generalized): Secondary | ICD-10-CM | POA: Diagnosis not present

## 2023-11-13 DIAGNOSIS — M869 Osteomyelitis, unspecified: Secondary | ICD-10-CM | POA: Diagnosis not present

## 2023-11-13 DIAGNOSIS — R208 Other disturbances of skin sensation: Secondary | ICD-10-CM | POA: Diagnosis not present

## 2023-11-13 DIAGNOSIS — M25641 Stiffness of right hand, not elsewhere classified: Secondary | ICD-10-CM | POA: Diagnosis not present

## 2023-11-13 NOTE — Patient Instructions (Addendum)
 MP Flexion (Active Isolated)   Bend __all____ fingers at large knuckle, keeping other fingers straight. Do not bend tips. Repeat _10-15___ times. Do __2_ sessions per day.  AROM: PIP Flexion / Extension   Pinch bottom knuckle of ___middle_____ finger of hand to prevent bending. Actively bend middle knuckle until stretch is felt. Hold __5__ seconds. Relax. Straighten finger as far as possible. Repeat __10-15__ times per set. Do _2__ sessions per day.    AROM: Finger Flexion / Extension   Actively bend fingers of  hand. Start with knuckles furthest from palm, and slowly make a fist. Hold __5__ seconds. Relax. Then straighten fingers as far as possible. Repeat _10-15___ times per set.  Do _4-6___ sessions per day.  Copyright  VHI. All rights reserved.     Opposition (Active)    Touch tip of thumb to nail tip of each finger in turn, making an O shape. Repeat __-10__ times. Do _1___ sessions per day.  Copyright  VHI. All rights reserved.

## 2023-11-25 ENCOUNTER — Encounter

## 2023-11-26 ENCOUNTER — Encounter: Payer: Self-pay | Admitting: Occupational Therapy

## 2023-11-26 ENCOUNTER — Ambulatory Visit: Admitting: Occupational Therapy

## 2023-11-26 DIAGNOSIS — M869 Osteomyelitis, unspecified: Secondary | ICD-10-CM | POA: Diagnosis not present

## 2023-11-26 DIAGNOSIS — M6281 Muscle weakness (generalized): Secondary | ICD-10-CM | POA: Diagnosis not present

## 2023-11-26 DIAGNOSIS — R278 Other lack of coordination: Secondary | ICD-10-CM | POA: Diagnosis not present

## 2023-11-26 DIAGNOSIS — E1165 Type 2 diabetes mellitus with hyperglycemia: Secondary | ICD-10-CM | POA: Diagnosis not present

## 2023-11-26 DIAGNOSIS — R208 Other disturbances of skin sensation: Secondary | ICD-10-CM | POA: Diagnosis not present

## 2023-11-26 DIAGNOSIS — M25641 Stiffness of right hand, not elsewhere classified: Secondary | ICD-10-CM

## 2023-11-26 DIAGNOSIS — E781 Pure hyperglyceridemia: Secondary | ICD-10-CM | POA: Diagnosis not present

## 2023-11-26 DIAGNOSIS — M25642 Stiffness of left hand, not elsewhere classified: Secondary | ICD-10-CM | POA: Diagnosis not present

## 2023-11-26 DIAGNOSIS — E039 Hypothyroidism, unspecified: Secondary | ICD-10-CM | POA: Diagnosis not present

## 2023-11-26 LAB — HEPATIC FUNCTION PANEL
ALT: 46 U/L — AB (ref 7–35)
AST: 50 — AB (ref 13–35)
Alkaline Phosphatase: 64 (ref 25–125)
Bilirubin, Total: 0.2

## 2023-11-26 LAB — BASIC METABOLIC PANEL WITH GFR
BUN: 15 (ref 4–21)
CO2: 23 — AB (ref 13–22)
Chloride: 100 (ref 99–108)
Creatinine: 0.8 (ref 0.5–1.1)
Glucose: 133
Potassium: 4.3 meq/L (ref 3.5–5.1)
Sodium: 137 (ref 137–147)

## 2023-11-26 LAB — LAB REPORT - SCANNED
Albumin, Urine POC: 680.1
Creatinine, POC: 87.1 mg/dL
EGFR: 112
EGFR: 92
TSH: 3.21 (ref 0.41–5.90)

## 2023-11-26 LAB — COMPREHENSIVE METABOLIC PANEL WITH GFR
Calcium: 8.9 (ref 8.7–10.7)
eGFR: 92

## 2023-11-26 NOTE — Therapy (Unsigned)
 OUTPATIENT OCCUPATIONAL THERAPY ORTHO treatment  Patient Name: Wanda Oneill MRN: [id number] DOB:1980/10/04, 43 y.o., female Today's Date: 11/26/2023  PCP: Dr. Harlene Copland REFERRING PROVIDERDr. Harlene Copland  END OF SESSION:  OT End of Session - 11/26/23 1322     Visit Number 2    Number of Visits 12    Date for Recertification  02/05/24    Authorization Type [employer]    Authorization - Visit Number 2    Progress Note Due on Visit 10    OT Start Time 1320    OT Stop Time 1400    OT Time Calculation (min) 40 min          Past Medical History:  Diagnosis Date   Ambulates with cane    4 prong   Diabetes mellitus    type II   Down syndrome    Down's syndrome    Family history of adverse reaction to anesthesia    mom has had n/v   Gastritis    Headache    Hepatic steatosis    Hidradenitis    Hypothyroidism    Irritable bowel syndrome    Osteoarthritis    Periumbilical hernia    Pneumonia 03/2020   frequent    Restless legs    Sleep apnea    Tendonitis    chronic in left foot   Thyroid  disease    hypothyroidism   Urinary incontinence 02/2022   some   UTI (urinary tract infection)    just dx 04/2022 - rx amoxicillin    Past Surgical History:  Procedure Laterality Date   AMPUTATION Right 04/11/2022   Procedure: RIGHT LONG FINGER AMPUTATION;  Surgeon: Murrell Drivers, MD;  Location: [facility] OR;  Service: Orthopedics;  Laterality: Right;   AXILLARY HIDRADENITIS EXCISION Bilateral    CHOLECYSTECTOMY     HERNIA REPAIR     multiple   INCISION AND DRAINAGE ABSCESS Right 02/06/2022   Procedure: INCISION AND DRAINAGE RIGHT HAND;  Surgeon: Murrell Drivers, MD;  Location: [facility] OR;  Service: Orthopedics;  Laterality: Right;   INCISION AND DRAINAGE WOUND WITH NAILBED REPAIR Left 02/06/2022   Procedure: INCISION AND DRAINAGE LEFT INDEX FINGER;  Surgeon: Murrell Drivers, MD;  Location: [facility] OR;  Service: Orthopedics;  Laterality: Left;   INCISIONAL HERNIA REPAIR N/A  05/30/2015   Procedure: LAPAROSCOPIC INCISIONAL HERNIA;  Surgeon: Donnice Bury, MD;  Location: [facility] OR;  Service: General;  Laterality: N/A;   INSERTION OF MESH N/A 05/30/2015   Procedure: INSERTION OF MESH;  Surgeon: Donnice Bury, MD;  Location: [facility] OR;  Service: General;  Laterality: N/A;   KNEE ARTHROSCOPY     right knee   LAPAROSCOPY N/A 05/30/2015   Procedure: LAPAROSCOPY DIAGNOSTIC;  Surgeon: Donnice Bury, MD;  Location: [facility] OR;  Service: General;  Laterality: N/A;   PARTIAL KNEE ARTHROPLASTY Right 10/30/2017   Procedure: UNICOMPARTMENTAL RIGHT KNEE LATERAL;  Surgeon: Ernie Donnice, MD;  Location: [facility] OR[facility];  Service: Orthopedics;  Laterality: Right;  90 mins   PARTIAL KNEE ARTHROPLASTY Left 06/29/2020   Procedure: UNICOMPARTMENTAL KNEE LATERALLY;  Surgeon: Ernie Donnice, MD;  Location: [facility] OR[facility];  Service: Orthopedics;  Laterality: Left;  70 MINS   TONSILLECTOMY     adenoids   Patient Active Problem List   Diagnosis Date Noted   OSA on CPAP 12/12/2022   DOE (dyspnea on exertion) 08/21/2022   Furuncles 02/08/2022   Infection of flexor tendon sheath 02/06/2022   Finger osteomyelitis, right (HCC) 02/06/2022   Leukocytosis 02/06/2022  History of COVID-19 02/06/2022   IBS (irritable bowel syndrome) 02/06/2022   Hypertriglyceridemia 02/06/2022   Obesity, Class III, BMI 40-49.9 (morbid obesity) (HCC) 02/06/2022   Lumbar facet joint syndrome 11/02/2021   Bilateral trigger thumb 06/18/2021   Chronic paronychia of finger 01/25/2021   S/P left unicompartmental knee replacement, lateral 06/29/2020   At risk for obstructive sleep apnea 06/11/2019   Cough 06/11/2019   Hyperglycemia 05/26/2019   Acute hypoxemic respiratory failure (HCC) 05/20/2019   Fibromyalgia 03/09/2019   Neck pain, chronic 03/09/2019   Acute shoulder bursitis, right 02/03/2019   Bile salt-induced diarrhea 09/25/2018   Arthritis of left acromioclavicular joint 09/23/2018   Precordial chest pain 08/11/2018    Morbid obesity (HCC) 10/31/2017   S/P right UKR, lateral 10/30/2017   Incarcerated epigastric hernia 05/30/2015   Left foot pain 08/09/2014   Chronic diarrhea 07/27/2014   Gout 08/10/2012   Acid reflux 01/22/2012   Down syndrome 11/11/2011   Hernia of abdominal wall 10/21/2011   Psoriasis 10/21/2011   Uncontrolled type 2 diabetes mellitus with hyperglycemia, with long-term current use of insulin  (HCC) 04/12/2011   Hypothyroid 04/12/2011   Gait abnormality 06/22/2010   Tibial tendinitis, posterior 06/22/2010    ONSET DATE: 10/30/23- referral date  REFERRING DIAG:  Diagnosis  M86.9 (ICD-10-CM) - Finger osteomyelitis, right (HCC)    THERAPY DIAG:  Stiffness of right hand, not elsewhere classified  Muscle weakness (generalized)  Other disturbances of skin sensation  Other lack of coordination  Rationale for Evaluation and Treatment: Rehabilitation  SUBJECTIVE:   SUBJECTIVE STATEMENT: Pt reports she had school today Pt accompanied by: self  PERTINENT HISTORY: hx of R long finger amputation through distal phalanx 04/11/22, healing wound at the edge of nail left long finger which is now closed PMH: Down's syndrome, DM   PRECAUTIONS: None  RED FLAGS: None   WEIGHT BEARING RESTRICTIONS: No  PAIN:  Are you having pain? Yes: NPRS scale: 2-3/10 Pain location: right middle finger Pain description: aching Aggravating factors: use, weather changes Relieving factors: rest Pt reports left shoulder pain today 7/10, heat alleviates overuse aggravates.  FALLS: Has patient fallen in last 6 months? Yes. Number of falls 1- PT addessed  LIVING ENVIRONMENT: Lives with: lives with their family and lives alone Lives in: House/apartment   PLOF: Independent with basic ADLs  PATIENT GOALS: improve use of right middle finger  NEXT MD VISIT: unknown  OBJECTIVE:  Note: Objective measures were completed at Evaluation unless otherwise noted.  HAND DOMINANCE: Right  ADLs: Overall  ADLs: mod I with all basic ADLS  FUNCTIONAL OUTCOME MEASURES: PSFS-5.3 Items-Pick up pills 5, cut food, 6, stirring batter and using RUE for baking 5  UPPER EXTREMITY ROM:      Active ROM Right eval   Thumb MCP (0-60)    Thumb IP (0-80)    Thumb Radial abd/add (0-55)    Thumb Palmar abd/add (0-45)     Thumb Opposition to Small Finger     Index MCP (0-90)     Index PIP (0-100)     Index DIP (0-70)      Long MCP (0-90) 80     Long PIP (0-100) 70     Long DIP (0-70)      Ring MCP (0-90)      Ring PIP (0-100)      Ring DIP (0-70)      Little MCP (0-90)      Little PIP (0-100)      Little DIP (0-70)      (  Blank rows = not tested)   HAND FUNCTION: Grip strength: Right: 20 lbs; Left: 34 lbs  COORDINATION: 9 Hole Peg test: Right: 1 min 4 secs sec; Left: 1 min 8  sec   SENSATION: Light touch: Impaired in fingertip   EDEMA: Pt with R middle finger amputation at distal phalanx  COGNITION: Overall cognitive status: History of cognitive impairments - at baseline, mild impairment   OBSERVATIONS: Pleasant female    TREATMENT DATE: 11/26/23- hotpack applied to right hand and left shoulder x 8 mins for pain and stiffness.  A/ROM exercises for PIP blocking, and active and passive composite finger flexion, place and holds  Pt was instructed in red putty HEP, min v.c and demonstration   11/13/23- inital eval                                                                                                                                 PATIENT EDUCATION: Education details: inital HEP, red putty Person educated: Patient Education method: Explanation, demonstration, v.c Education comprehension: verbalized understanding, returned demonstraion, needs review  HOME EXERCISE PROGRAM: inital HEP 11/13/23\ putty  GOALS: Goals reviewed with patient? Yes  SHORT TERM GOALS: Target date: 12/13/23  I with inital HEP  Goal status: ongoing 11/26/23  2.  Pt will increase RUE  middle finger PIP flexion to 75* for increased functional use.  Goal status: ongoing 11/26/23  3.  Pt will improve RUE grip strength to 25# or better for increased functional use. Baseline: 20# Goal status: ongoing, 11/26/23  4.  I with adapted strategies and adapted equipment to maximize pt's safety and I with ADLs/IADLs.   Goal status: ongoing 11/26/23    LONG TERM GOALS: Target date: 02/05/24  I with updated HEP  Goal status: INITIAL  2.  Pt will demonstrate improved RUE functional use as evidenced by increasing PSFS to 7. Baseline: 5.3 Goal status: INITIAL  3.  I with R fingertip protector splint wear/ care and prec.  Baseline: dependnent Goal status: INITIAL    ASSESSMENT:  CLINICAL IMPRESSION:Pt is progressing towards goals for inital HEP. Patient is PERFORMANCE DEFICITS: in functional skills including ADLs, IADLs, coordination, dexterity, sensation, edema, ROM, strength, pain, flexibility, Fine motor control, Gross motor control, decreased knowledge of precautions, decreased knowledge of use of DME, and UE functional use, cognitive skills including problem solving, thought, and understand, and psychosocial skills including coping strategies, environmental adaptation, habits, interpersonal interactions, and routines and behaviors.   IMPAIRMENTS: are limiting patient from ADLs, IADLs, play, leisure, and social participation.   COMORBIDITIES: may have co-morbidities  that affects occupational performance. Patient will benefit from skilled OT to address above impairments and improve overall function.  MODIFICATION OR ASSISTANCE TO COMPLETE EVALUATION: No modification of tasks or assist necessary to complete an evaluation.  OT OCCUPATIONAL PROFILE AND HISTORY: Detailed assessment: Review of records and additional review of physical, cognitive, psychosocial history related to current functional performance.  CLINICAL DECISION  MAKING: LOW - limited treatment options, no task  modification necessary  REHAB POTENTIAL: Good  EVALUATION COMPLEXITY: Low      PLAN:  OT FREQUENCY: 1x/week  OT DURATION: 12 weeks  PLANNED INTERVENTIONS: 97168 OT Re-evaluation, 97535 self care/ADL training, 02889 therapeutic exercise, 97530 therapeutic activity, 97112 neuromuscular re-education, 97140 manual therapy, 97035 ultrasound, 97018 paraffin, 02989 moist heat, 97010 cryotherapy, 97014 electrical stimulation unattended, 97760 Orthotic Initial, 97763 Orthotic/Prosthetic subsequent, scar mobilization, passive range of motion, energy conservation, coping strategies training, patient/family education, and DME and/or AE instructions  RECOMMENDED OTHER SERVICES: n/a  CONSULTED AND AGREED WITH PLAN OF CARE:   PLAN FOR NEXT SESSION: progress HEP, functional use of RUE   Marionette Meskill, OT 11/26/2023, 1:26 PM

## 2023-11-26 NOTE — Patient Instructions (Signed)
   AROM: Finger Flexion / Extension   Actively bend fingers of  hand. Start with knuckles furthest from palm, and slowly make a fist. Hold __5__ seconds. Relax. Then straighten fingers as far as possible. Repeat _10-15___ times per set.  Do  2___ sessions per day.  Copyright  VHI. All rights reserved.      AROM: PIP Flexion / Extension   Pinch bottom knuckle of ___middle_____ finger of hand to prevent bending. Actively bend middle knuckle until stretch is felt. Hold __5__ seconds. Relax. Straighten finger as far as possible. Repeat __10-15__ times per set. Do _2__ sessions per day.    Copyright  VHI. All rights reserved.   Grip Strengthening (Resistive Putty)    Squeeze putty using thumb and all fingers. Repeat _10-20___ times. Do __1__ sessions per day.  Copyright  VHI. All rights reserved.   Finger / Thumb Activities: Extension    Roll putty into rope shape using all fingers held straight. Hitchhike with thumb up and out.  Copyright  VHI. All rights reserved.   Pinch: Palmar    Pinch putty with right thumb and each fingertip in turn. Repeat __10-20__ times. Do __1__ sessions per day.   Copyright  VHI. All rights reserved.

## 2023-12-02 ENCOUNTER — Encounter: Payer: Self-pay | Admitting: Occupational Therapy

## 2023-12-02 ENCOUNTER — Ambulatory Visit: Admitting: Occupational Therapy

## 2023-12-02 DIAGNOSIS — R208 Other disturbances of skin sensation: Secondary | ICD-10-CM

## 2023-12-02 DIAGNOSIS — R278 Other lack of coordination: Secondary | ICD-10-CM

## 2023-12-02 DIAGNOSIS — M25641 Stiffness of right hand, not elsewhere classified: Secondary | ICD-10-CM

## 2023-12-02 DIAGNOSIS — M6281 Muscle weakness (generalized): Secondary | ICD-10-CM

## 2023-12-02 DIAGNOSIS — M25642 Stiffness of left hand, not elsewhere classified: Secondary | ICD-10-CM

## 2023-12-02 DIAGNOSIS — M869 Osteomyelitis, unspecified: Secondary | ICD-10-CM | POA: Diagnosis not present

## 2023-12-02 NOTE — Therapy (Signed)
 OUTPATIENT OCCUPATIONAL THERAPY ORTHO treatment  Patient Name: Wanda Oneill MRN: [id number] DOB:01/05/1981, 43 y.o., female Today's Date: 12/02/2023  PCP: Dr. Harlene Copland REFERRING PROVIDERDr. Harlene Copland  END OF SESSION:  OT End of Session - 12/02/23 1259     Visit Number 3    Number of Visits 12    Date for Recertification  02/05/24    Authorization Type [employer]    Authorization - Visit Number 3    Progress Note Due on Visit 10    OT Start Time 1233    OT Stop Time 1315    OT Time Calculation (min) 42 min    Activity Tolerance Patient tolerated treatment well    Behavior During Therapy WFL for tasks assessed/performed          Past Medical History:  Diagnosis Date   Ambulates with cane    4 prong   Diabetes mellitus    type II   Down syndrome    Down's syndrome    Family history of adverse reaction to anesthesia    mom has had n/v   Gastritis    Headache    Hepatic steatosis    Hidradenitis    Hypothyroidism    Irritable bowel syndrome    Osteoarthritis    Periumbilical hernia    Pneumonia 03/2020   frequent    Restless legs    Sleep apnea    Tendonitis    chronic in left foot   Thyroid  disease    hypothyroidism   Urinary incontinence 02/2022   some   UTI (urinary tract infection)    just dx 04/2022 - rx amoxicillin    Past Surgical History:  Procedure Laterality Date   AMPUTATION Right 04/11/2022   Procedure: RIGHT LONG FINGER AMPUTATION;  Surgeon: Murrell Drivers, MD;  Location: [facility] OR;  Service: Orthopedics;  Laterality: Right;   AXILLARY HIDRADENITIS EXCISION Bilateral    CHOLECYSTECTOMY     HERNIA REPAIR     multiple   INCISION AND DRAINAGE ABSCESS Right 02/06/2022   Procedure: INCISION AND DRAINAGE RIGHT HAND;  Surgeon: Murrell Drivers, MD;  Location: [facility] OR;  Service: Orthopedics;  Laterality: Right;   INCISION AND DRAINAGE WOUND WITH NAILBED REPAIR Left 02/06/2022   Procedure: INCISION AND DRAINAGE LEFT INDEX FINGER;  Surgeon:  Murrell Drivers, MD;  Location: [facility] OR;  Service: Orthopedics;  Laterality: Left;   INCISIONAL HERNIA REPAIR N/A 05/30/2015   Procedure: LAPAROSCOPIC INCISIONAL HERNIA;  Surgeon: Donnice Bury, MD;  Location: [facility] OR;  Service: General;  Laterality: N/A;   INSERTION OF MESH N/A 05/30/2015   Procedure: INSERTION OF MESH;  Surgeon: Donnice Bury, MD;  Location: [facility] OR;  Service: General;  Laterality: N/A;   KNEE ARTHROSCOPY     right knee   LAPAROSCOPY N/A 05/30/2015   Procedure: LAPAROSCOPY DIAGNOSTIC;  Surgeon: Donnice Bury, MD;  Location: [facility] OR;  Service: General;  Laterality: N/A;   PARTIAL KNEE ARTHROPLASTY Right 10/30/2017   Procedure: UNICOMPARTMENTAL RIGHT KNEE LATERAL;  Surgeon: Ernie Donnice, MD;  Location: [facility] OR[facility];  Service: Orthopedics;  Laterality: Right;  90 mins   PARTIAL KNEE ARTHROPLASTY Left 06/29/2020   Procedure: UNICOMPARTMENTAL KNEE LATERALLY;  Surgeon: Ernie Donnice, MD;  Location: [facility];  Service: Orthopedics;  Laterality: Left;  70 MINS   TONSILLECTOMY     adenoids   Patient Active Problem List   Diagnosis Date Noted   OSA on CPAP 12/12/2022   DOE (dyspnea on exertion) 08/21/2022   Furuncles 02/08/2022  Infection of flexor tendon sheath 02/06/2022   Finger osteomyelitis, right (HCC) 02/06/2022   Leukocytosis 02/06/2022   History of COVID-19 02/06/2022   IBS (irritable bowel syndrome) 02/06/2022   Hypertriglyceridemia 02/06/2022   Obesity, Class III, BMI 40-49.9 (morbid obesity) (HCC) 02/06/2022   Lumbar facet joint syndrome 11/02/2021   Bilateral trigger thumb 06/18/2021   Chronic paronychia of finger 01/25/2021   S/P left unicompartmental knee replacement, lateral 06/29/2020   At risk for obstructive sleep apnea 06/11/2019   Cough 06/11/2019   Hyperglycemia 05/26/2019   Acute hypoxemic respiratory failure (HCC) 05/20/2019   Fibromyalgia 03/09/2019   Neck pain, chronic 03/09/2019   Acute shoulder bursitis, right 02/03/2019   Bile salt-induced  diarrhea 09/25/2018   Arthritis of left acromioclavicular joint 09/23/2018   Precordial chest pain 08/11/2018   Morbid obesity (HCC) 10/31/2017   S/P right UKR, lateral 10/30/2017   Incarcerated epigastric hernia 05/30/2015   Left foot pain 08/09/2014   Chronic diarrhea 07/27/2014   Gout 08/10/2012   Acid reflux 01/22/2012   Down syndrome 11/11/2011   Hernia of abdominal wall 10/21/2011   Psoriasis 10/21/2011   Uncontrolled type 2 diabetes mellitus with hyperglycemia, with long-term current use of insulin  (HCC) 04/12/2011   Hypothyroid 04/12/2011   Gait abnormality 06/22/2010   Tibial tendinitis, posterior 06/22/2010    ONSET DATE: 10/30/23- referral date  REFERRING DIAG:  Diagnosis  M86.9 (ICD-10-CM) - Finger osteomyelitis, right (HCC)    THERAPY DIAG:  Stiffness of right hand, not elsewhere classified  Muscle weakness (generalized)  Other disturbances of skin sensation  Other lack of coordination  Stiffness of left hand, not elsewhere classified  Rationale for Evaluation and Treatment: Rehabilitation  SUBJECTIVE:   SUBJECTIVE STATEMENT: Pt reports she brought her putty today Pt accompanied by: self  PERTINENT HISTORY: hx of R long finger amputation through distal phalanx 04/11/22, healing wound at the edge of nail left long finger which is now closed PMH: Down's syndrome, DM   PRECAUTIONS: None  RED FLAGS: None   WEIGHT BEARING RESTRICTIONS: No  PAIN:  Are you having pain? Yes: NPRS scale: 1-2/10 Pain location: right middle finger Pain description: aching Aggravating factors: use, weather changes Relieving factors: rest Pt reports left shoulder pain today 6/10, heat alleviates overuse aggravates.  FALLS: Has patient fallen in last 6 months? Yes. Number of falls 1- PT addessed  LIVING ENVIRONMENT: Lives with: lives with their family and lives alone Lives in: House/apartment   PLOF: Independent with basic ADLs  PATIENT GOALS: improve use of right  middle finger  NEXT MD VISIT: unknown  OBJECTIVE:  Note: Objective measures were completed at Evaluation unless otherwise noted.  HAND DOMINANCE: Right  ADLs: Overall ADLs: mod I with all basic ADLS  FUNCTIONAL OUTCOME MEASURES: PSFS-5.3 Items-Pick up pills 5, cut food, 6, stirring batter and using RUE for baking 5  UPPER EXTREMITY ROM:      Active ROM Right eval   Thumb MCP (0-60)    Thumb IP (0-80)    Thumb Radial abd/add (0-55)    Thumb Palmar abd/add (0-45)     Thumb Opposition to Small Finger     Index MCP (0-90)     Index PIP (0-100)     Index DIP (0-70)      Long MCP (0-90) 80     Long PIP (0-100) 70     Long DIP (0-70)      Ring MCP (0-90)      Ring PIP (0-100)  Ring DIP (0-70)      Little MCP (0-90)      Little PIP (0-100)      Little DIP (0-70)      (Blank rows = not tested)   HAND FUNCTION: Grip strength: Right: 20 lbs; Left: 34 lbs  COORDINATION: 9 Hole Peg test: Right: 1 min 4 secs sec; Left: 1 min 8  sec   SENSATION: Light touch: Impaired in fingertip   EDEMA: Pt with R middle finger amputation at distal phalanx  COGNITION: Overall cognitive status: History of cognitive impairments - at baseline, mild impairment   OBSERVATIONS: Pleasant female    TREATMENT DATE: hotpack applied to right hand x 6 mins then left shoulder x 20 mins for pain and stiffness.  A/ROM exercises for PIP blocking, and active and passive composite finger flexion,   Pt performed red putty HEP, for composite grip and tip pinch min v.c and demonstration Flipping playing cards with RUE for increased functional use. Foam grip applied to pen and pt practiced using for writing with improved legibility, min v.c for hand placement.  11/26/23- hotpack applied to right hand and left shoulder x 8 mins for pain and stiffness.  A/ROM exercises for PIP blocking, and active and passive composite finger flexion, place and holds  Pt was instructed in red putty HEP, min v.c and  demonstration   11/13/23- inital eval                                                                                                                                 PATIENT EDUCATION: Education details: inital HEP, red putty Person educated: Patient Education method: Explanation, demonstration, v.c Education comprehension: verbalized understanding, returned demonstraion, needs review  HOME EXERCISE PROGRAM: inital HEP 11/13/23\ putty  GOALS: Goals reviewed with patient? Yes  SHORT TERM GOALS: Target date: 12/13/23  I with inital HEP  Goal status:met, 12/02/23  2.  Pt will increase RUE middle finger PIP flexion to 75* for increased functional use.  Goal status:  ongoing 12/02/23  3.  Pt will improve RUE grip strength to 25# or better for increased functional use. Baseline: 20# Goal status: ongoing, 11/26/23  4.  I with adapted strategies and adapted equipment to maximize pt's safety and I with ADLs/IADLs.   Goal status: ongoing adapted pen issued  12/02/23    LONG TERM GOALS: Target date: 02/05/24  I with updated HEP  Goal status: INITIAL  2.  Pt will demonstrate improved RUE functional use as evidenced by increasing PSFS to 7. Baseline: 5.3 Goal status: INITIAL  3.  I with R fingertip protector splint wear/ care and prec.  Baseline: dependnent Goal status: INITIAL    ASSESSMENT:  CLINICAL IMPRESSION:Pt is progressing towards goals. she demonstrates understandingof intial HEP. Pt demonstrates improved ease with writing using foam grip on pen.  Patient is PERFORMANCE DEFICITS: in functional skills including ADLs, IADLs, coordination, dexterity, sensation, edema, ROM, strength, pain, flexibility,  Fine motor control, Gross motor control, decreased knowledge of precautions, decreased knowledge of use of DME, and UE functional use, cognitive skills including problem solving, thought, and understand, and psychosocial skills including coping strategies,  environmental adaptation, habits, interpersonal interactions, and routines and behaviors.   IMPAIRMENTS: are limiting patient from ADLs, IADLs, play, leisure, and social participation.   COMORBIDITIES: may have co-morbidities  that affects occupational performance. Patient will benefit from skilled OT to address above impairments and improve overall function.  MODIFICATION OR ASSISTANCE TO COMPLETE EVALUATION: No modification of tasks or assist necessary to complete an evaluation.  OT OCCUPATIONAL PROFILE AND HISTORY: Detailed assessment: Review of records and additional review of physical, cognitive, psychosocial history related to current functional performance.  CLINICAL DECISION MAKING: LOW - limited treatment options, no task modification necessary  REHAB POTENTIAL: Good  EVALUATION COMPLEXITY: Low      PLAN:  OT FREQUENCY: 1x/week  OT DURATION: 12 weeks  PLANNED INTERVENTIONS: 97168 OT Re-evaluation, 97535 self care/ADL training, 02889 therapeutic exercise, 97530 therapeutic activity, 97112 neuromuscular re-education, 97140 manual therapy, 97035 ultrasound, 97018 paraffin, 02989 moist heat, 97010 cryotherapy, 97014 electrical stimulation unattended, 97760 Orthotic Initial, 97763 Orthotic/Prosthetic subsequent, scar mobilization, passive range of motion, energy conservation, coping strategies training, patient/family education, and DME and/or AE instructions  RECOMMENDED OTHER SERVICES: n/a  CONSULTED AND AGREED WITH PLAN OF CARE:   PLAN FOR NEXT SESSION: progress HEP, functional use of RUE   Simon Aaberg, OT 12/02/2023, 1:06 PM

## 2023-12-09 ENCOUNTER — Ambulatory Visit

## 2023-12-09 ENCOUNTER — Ambulatory Visit: Attending: Family Medicine | Admitting: Occupational Therapy

## 2023-12-09 DIAGNOSIS — M25641 Stiffness of right hand, not elsewhere classified: Secondary | ICD-10-CM | POA: Diagnosis not present

## 2023-12-09 DIAGNOSIS — R208 Other disturbances of skin sensation: Secondary | ICD-10-CM | POA: Insufficient documentation

## 2023-12-09 DIAGNOSIS — R278 Other lack of coordination: Secondary | ICD-10-CM | POA: Insufficient documentation

## 2023-12-09 DIAGNOSIS — M6281 Muscle weakness (generalized): Secondary | ICD-10-CM | POA: Diagnosis not present

## 2023-12-09 DIAGNOSIS — M25642 Stiffness of left hand, not elsewhere classified: Secondary | ICD-10-CM | POA: Insufficient documentation

## 2023-12-09 NOTE — Patient Instructions (Signed)
 Wear your fingertip protector during the day for house cleaning or other aggravating activities.  Remove splint if you have pain or pressure areas and stop wearing do not wear when washing dishes or bathing. Do not sleep in your fingertip protector.

## 2023-12-09 NOTE — Therapy (Unsigned)
 OUTPATIENT OCCUPATIONAL THERAPY ORTHO treatment  Patient Name: Wanda Oneill MRN: [id number] DOB:19-Jul-1980, 43 y.o., female Today's Date: 12/10/2023  PCP: Dr. Harlene Copland REFERRING PROVIDERDr. Harlene Copland  END OF SESSION:  OT End of Session - 12/09/23 1106     Visit Number 4    Number of Visits 12    Date for Recertification  02/05/24    Authorization Type [employer]    Authorization - Visit Number 3    Progress Note Due on Visit 10    OT Start Time 1101    OT Stop Time 1148    OT Time Calculation (min) 47 min          Past Medical History:  Diagnosis Date   Ambulates with cane    4 prong   Diabetes mellitus    type II   Down syndrome    Down's syndrome    Family history of adverse reaction to anesthesia    mom has had n/v   Gastritis    Headache    Hepatic steatosis    Hidradenitis    Hypothyroidism    Irritable bowel syndrome    Osteoarthritis    Periumbilical hernia    Pneumonia 03/2020   frequent    Restless legs    Sleep apnea    Tendonitis    chronic in left foot   Thyroid  disease    hypothyroidism   Urinary incontinence 02/2022   some   UTI (urinary tract infection)    just dx 04/2022 - rx amoxicillin    Past Surgical History:  Procedure Laterality Date   AMPUTATION Right 04/11/2022   Procedure: RIGHT LONG FINGER AMPUTATION;  Surgeon: Murrell Drivers, MD;  Location: [facility] OR;  Service: Orthopedics;  Laterality: Right;   AXILLARY HIDRADENITIS EXCISION Bilateral    CHOLECYSTECTOMY     HERNIA REPAIR     multiple   INCISION AND DRAINAGE ABSCESS Right 02/06/2022   Procedure: INCISION AND DRAINAGE RIGHT HAND;  Surgeon: Murrell Drivers, MD;  Location: [facility] OR;  Service: Orthopedics;  Laterality: Right;   INCISION AND DRAINAGE WOUND WITH NAILBED REPAIR Left 02/06/2022   Procedure: INCISION AND DRAINAGE LEFT INDEX FINGER;  Surgeon: Murrell Drivers, MD;  Location: [facility] OR;  Service: Orthopedics;  Laterality: Left;   INCISIONAL HERNIA REPAIR N/A 05/30/2015    Procedure: LAPAROSCOPIC INCISIONAL HERNIA;  Surgeon: Donnice Bury, MD;  Location: [facility] OR;  Service: General;  Laterality: N/A;   INSERTION OF MESH N/A 05/30/2015   Procedure: INSERTION OF MESH;  Surgeon: Donnice Bury, MD;  Location: [facility] OR;  Service: General;  Laterality: N/A;   KNEE ARTHROSCOPY     right knee   LAPAROSCOPY N/A 05/30/2015   Procedure: LAPAROSCOPY DIAGNOSTIC;  Surgeon: Donnice Bury, MD;  Location: [facility] OR;  Service: General;  Laterality: N/A;   PARTIAL KNEE ARTHROPLASTY Right 10/30/2017   Procedure: UNICOMPARTMENTAL RIGHT KNEE LATERAL;  Surgeon: Ernie Donnice, MD;  Location: [facility];  Service: Orthopedics;  Laterality: Right;  90 mins   PARTIAL KNEE ARTHROPLASTY Left 06/29/2020   Procedure: UNICOMPARTMENTAL KNEE LATERALLY;  Surgeon: Ernie Donnice, MD;  Location: [facility];  Service: Orthopedics;  Laterality: Left;  70 MINS   TONSILLECTOMY     adenoids   Patient Active Problem List   Diagnosis Date Noted   OSA on CPAP 12/12/2022   DOE (dyspnea on exertion) 08/21/2022   Furuncles 02/08/2022   Infection of flexor tendon sheath 02/06/2022   Finger osteomyelitis, right (HCC) 02/06/2022   Leukocytosis 02/06/2022  History of COVID-19 02/06/2022   IBS (irritable bowel syndrome) 02/06/2022   Hypertriglyceridemia 02/06/2022   Obesity, Class III, BMI 40-49.9 (morbid obesity) (HCC) 02/06/2022   Lumbar facet joint syndrome 11/02/2021   Bilateral trigger thumb 06/18/2021   Chronic paronychia of finger 01/25/2021   S/P left unicompartmental knee replacement, lateral 06/29/2020   At risk for obstructive sleep apnea 06/11/2019   Cough 06/11/2019   Hyperglycemia 05/26/2019   Acute hypoxemic respiratory failure (HCC) 05/20/2019   Fibromyalgia 03/09/2019   Neck pain, chronic 03/09/2019   Acute shoulder bursitis, right 02/03/2019   Bile salt-induced diarrhea 09/25/2018   Arthritis of left acromioclavicular joint 09/23/2018   Precordial chest pain 08/11/2018   Morbid  obesity (HCC) 10/31/2017   S/P right UKR, lateral 10/30/2017   Incarcerated epigastric hernia 05/30/2015   Left foot pain 08/09/2014   Chronic diarrhea 07/27/2014   Gout 08/10/2012   Acid reflux 01/22/2012   Down syndrome 11/11/2011   Hernia of abdominal wall 10/21/2011   Psoriasis 10/21/2011   Uncontrolled type 2 diabetes mellitus with hyperglycemia, with long-term current use of insulin  (HCC) 04/12/2011   Hypothyroid 04/12/2011   Gait abnormality 06/22/2010   Tibial tendinitis, posterior 06/22/2010    ONSET DATE: 10/30/23- referral date  REFERRING DIAG:  Diagnosis  M86.9 (ICD-10-CM) - Finger osteomyelitis, right (HCC)    THERAPY DIAG:  Stiffness of right hand, not elsewhere classified  Muscle weakness (generalized)  Other disturbances of skin sensation  Other lack of coordination  Stiffness of left hand, not elsewhere classified  Rationale for Evaluation and Treatment: Rehabilitation  SUBJECTIVE:   SUBJECTIVE STATEMENT: Pt reports her hand is alittle stiff Pt accompanied by: self  PERTINENT HISTORY: hx of R long finger amputation through distal phalanx 04/11/22, healing wound at the edge of nail left long finger which is now closed PMH: Down's syndrome, DM   PRECAUTIONS: None  RED FLAGS: None   WEIGHT BEARING RESTRICTIONS: No  PAIN:  Are you having pain? Yes: NPRS scale: 2-3/10 Pain location: right middle finger Pain description: aching Aggravating factors: use, weather changes Relieving factors: rest Pt reports left shoulder pain today 6/10, heat alleviates overuse aggravates.  FALLS: Has patient fallen in last 6 months? Yes. Number of falls 1- PT addessed  LIVING ENVIRONMENT: Lives with: lives with their family and lives alone Lives in: House/apartment   PLOF: Independent with basic ADLs  PATIENT GOALS: improve use of right middle finger  NEXT MD VISIT: unknown  OBJECTIVE:  Note: Objective measures were completed at Evaluation unless otherwise  noted.  HAND DOMINANCE: Right  ADLs: Overall ADLs: mod I with all basic ADLS  FUNCTIONAL OUTCOME MEASURES: PSFS-5.3 Items-Pick up pills 5, cut food, 6, stirring batter and using RUE for baking 5  UPPER EXTREMITY ROM:      Active ROM Right eval   Thumb MCP (0-60)    Thumb IP (0-80)    Thumb Radial abd/add (0-55)    Thumb Palmar abd/add (0-45)     Thumb Opposition to Small Finger     Index MCP (0-90)     Index PIP (0-100)     Index DIP (0-70)      Long MCP (0-90) 80     Long PIP (0-100) 70     Long DIP (0-70)      Ring MCP (0-90)      Ring PIP (0-100)      Ring DIP (0-70)      Little MCP (0-90)      Little PIP (  0-100)      Little DIP (0-70)      (Blank rows = not tested)   HAND FUNCTION: Grip strength: Right: 20 lbs; Left: 34 lbs  COORDINATION: 9 Hole Peg test: Right: 1 min 4 secs sec; Left: 1 min 8  sec   SENSATION: Light touch: Impaired in fingertip   EDEMA: Pt with R middle finger amputation at distal phalanx  COGNITION: Overall cognitive status: History of cognitive impairments - at baseline, mild impairment   OBSERVATIONS: Pleasant female    TREATMENT DATE: 111/04/25 hotpack applied to right hand x 5 mins and left shoulder x 20 mins for pain and stiffness. No adverse reactions A/ROM exercises for PIP blocking, and active and passive composite finger flexion,   Pt performed red putty HEP, for composite grip and tip pinch min v.c and demonstration for RUE Pt was fitted with a fingertip protector splint for right middle finger and instructed in wear/ care, and precautions.  12/02/23 hotpack applied to right hand x 6 mins then left shoulder x 20 mins for pain and stiffness.  A/ROM exercises for PIP blocking, and active and passive composite finger flexion,   Pt performed red putty HEP, for composite grip and tip pinch min v.c and demonstration Flipping playing cards with RUE for increased functional use. Foam grip applied to pen and pt practiced using  for writing with improved legibility, min v.c for hand placement.  11/26/23- hotpack applied to right hand and left shoulder x 8 mins for pain and stiffness.  A/ROM exercises for PIP blocking, and active and passive composite finger flexion, place and holds  Pt was instructed in red putty HEP, min v.c and demonstration   11/13/23- inital eval                                                                                                                                 PATIENT EDUCATION: Education details: teaching laboratory technician splint Person educated: Patient Education method: Explanation, demonstration, v.c, handout  Education comprehension: verbalized understanding, returned demonstraion, needs review  HOME EXERCISE PROGRAM: inital HEP 11/13/23\ putty  GOALS: Goals reviewed with patient? Yes  SHORT TERM GOALS: Target date: 12/13/23  I with inital HEP  Goal status:met, 12/02/23  2.  Pt will increase RUE middle finger PIP flexion to 75* for increased functional use.  Goal status:  ongoing 60*, 12/09/23  3.  Pt will improve RUE grip strength to 25# or better for increased functional use. Baseline: 20# Goal status: ongoing, 20# 12/09/23  4.  I with adapted strategies and adapted equipment to maximize pt's safety and I with ADLs/IADLs.   Goal status: ongoing adapted pen issued  12/09/23    LONG TERM GOALS: Target date: 02/05/24  I with updated HEP  Goal status: INITIAL  2.  Pt will demonstrate improved RUE functional use as evidenced by increasing PSFS to 7. Baseline: 5.3 Goal status: INITIAL  3.  I with R  fingertip protector splint wear/ care and prec.  Baseline: dependnent Goal status: 12/09/23- splint issued, ongoing    ASSESSMENT:  CLINICAL IMPRESSION:Pt is progressing towards goals. She verbalizes understanding of fingertip protector splint wear, caare and precuations.   PERFORMANCE DEFICITS: in functional skills including ADLs, IADLs, coordination,  dexterity, sensation, edema, ROM, strength, pain, flexibility, Fine motor control, Gross motor control, decreased knowledge of precautions, decreased knowledge of use of DME, and UE functional use, cognitive skills including problem solving, thought, and understand, and psychosocial skills including coping strategies, environmental adaptation, habits, interpersonal interactions, and routines and behaviors.   IMPAIRMENTS: are limiting patient from ADLs, IADLs, play, leisure, and social participation.   COMORBIDITIES: may have co-morbidities  that affects occupational performance. Patient will benefit from skilled OT to address above impairments and improve overall function.  MODIFICATION OR ASSISTANCE TO COMPLETE EVALUATION: No modification of tasks or assist necessary to complete an evaluation.  OT OCCUPATIONAL PROFILE AND HISTORY: Detailed assessment: Review of records and additional review of physical, cognitive, psychosocial history related to current functional performance.  CLINICAL DECISION MAKING: LOW - limited treatment options, no task modification necessary  REHAB POTENTIAL: Good  EVALUATION COMPLEXITY: Low      PLAN:  OT FREQUENCY: 1x/week  OT DURATION: 12 weeks  PLANNED INTERVENTIONS: 97168 OT Re-evaluation, 97535 self care/ADL training, 02889 therapeutic exercise, 97530 therapeutic activity, 97112 neuromuscular re-education, 97140 manual therapy, 97035 ultrasound, 97018 paraffin, 02989 moist heat, 97010 cryotherapy, 97014 electrical stimulation unattended, 97760 Orthotic Initial, 97763 Orthotic/Prosthetic subsequent, scar mobilization, passive range of motion, energy conservation, coping strategies training, patient/family education, and DME and/or AE instructions  RECOMMENDED OTHER SERVICES: n/a  CONSULTED AND AGREED WITH PLAN OF CARE:   PLAN FOR NEXT SESSION: progress HEP, functional use of RUE   Beverley Sherrard, OT 12/10/2023, 12:28 PM

## 2023-12-15 ENCOUNTER — Telehealth: Payer: Self-pay | Admitting: Family Medicine

## 2023-12-15 NOTE — Telephone Encounter (Signed)
 Copied from CRM [id number]. Topic: Medicare AWV >> Dec 15, 2023 11:36 AM Nathanel DEL wrote: Called LVM 12/15/2023 to schedule AWV. Please schedule office or virtual visits  Nathanel Paschal; Care Guide Ambulatory Clinical Support [facility] Direct Dial: [phone]

## 2023-12-15 NOTE — Progress Notes (Addendum)
 [employer], [employer] [facility] [employer] [address], [address] [place], [place] [address] [phone]  Date:  12/18/2023   Name:  Wanda Oneill   DOB:  07/30/1980   MRN:  [id number]  PCP:  Watt Harlene BROCKS, MD    Chief Complaint: Follow-up (Discuss a mammogram [place] )   History of Present Illness:  Wanda Oneill is a 43 y.o. very pleasant female patient who presents with the following:  Patient seen today for follow-up.  I saw her most recently about a year ago when she was ill with a cough.  She does follow-up with several specialists including orthopedics, pulmonology, endocrinology History of diabetes, hypothyroidism, obesity, Down syndrome and several associated MSK problems    She saw her endocrinologist, Dr. Tommas most recently in August Seen by ophthalmology in July She has history of osteomyelitis in her right long finger and had a partial amputation in 2024 Visit with pulmonology in [facility] does have sleep apnea and is now using BiPAP  - Foot exam - Labs  She did get a flu shot and covid booster  She did have an adverse reaction to a pneumonia vaccine years ago   Discussed the use of AI scribe software for clinical note transcription with the patient, who gave verbal consent to proceed.  History of Present Illness Wanda Oneill is a 43 year old female who presents with restless legs syndrome and knee pain.  She experiences restless legs syndrome primarily at night and sometimes in the evening, especially when sitting for long periods. Both legs are affected, and she describes a sensation of needing to move them frequently. This has been ongoing for a few years, but she is uncertain if it has worsened.  She has not yet tried any medication for this, we note that her father takes gabapentin  for restless legs with good results.  She also experiences cramping in her toes.  She has not yet had a mammogram but is willing to get  this procedure.  She notes no symptoms with her breast.  I will order a mammogram for her.  We are glad to hear that her breathing symptoms are much better than last year. She is using a new inhaler, I believe the Arnuity Ellipta  which is working well.  She also uses CPAP at night  Patient Active Problem List   Diagnosis Date Noted   OSA on CPAP 12/12/2022   DOE (dyspnea on exertion) 08/21/2022   Furuncles 02/08/2022   Infection of flexor tendon sheath 02/06/2022   Finger osteomyelitis, right (HCC) 02/06/2022   Leukocytosis 02/06/2022   History of COVID-19 02/06/2022   IBS (irritable bowel syndrome) 02/06/2022   Hypertriglyceridemia 02/06/2022   Obesity, Class III, BMI 40-49.9 (morbid obesity) (HCC) 02/06/2022   Lumbar facet joint syndrome 11/02/2021   Bilateral trigger thumb 06/18/2021   Chronic paronychia of finger 01/25/2021   S/P left unicompartmental knee replacement, lateral 06/29/2020   At risk for obstructive sleep apnea 06/11/2019   Cough 06/11/2019   Hyperglycemia 05/26/2019   Acute hypoxemic respiratory failure (HCC) 05/20/2019   Fibromyalgia 03/09/2019   Neck pain, chronic 03/09/2019   Acute shoulder bursitis, right 02/03/2019   Bile salt-induced diarrhea 09/25/2018   Arthritis of left acromioclavicular joint 09/23/2018   Precordial chest pain 08/11/2018   Morbid obesity (HCC) 10/31/2017   S/P right UKR, lateral 10/30/2017   Incarcerated epigastric hernia 05/30/2015   Left foot pain 08/09/2014   Chronic  diarrhea 07/27/2014   Gout 08/10/2012   Acid reflux 01/22/2012   Down syndrome 11/11/2011   Hernia of abdominal wall 10/21/2011   Psoriasis 10/21/2011   Uncontrolled type 2 diabetes mellitus with hyperglycemia, with long-term current use of insulin  (HCC) 04/12/2011   Hypothyroid 04/12/2011   Gait abnormality 06/22/2010   Tibial tendinitis, posterior 06/22/2010    Past Medical History:  Diagnosis Date   Ambulates with cane    4 prong   Diabetes mellitus     type II   Down syndrome    Down's syndrome    Family history of adverse reaction to anesthesia    mom has had n/v   Gastritis    Headache    Hepatic steatosis    Hidradenitis    Hypothyroidism    Irritable bowel syndrome    Osteoarthritis    Periumbilical hernia    Pneumonia 03/2020   frequent    Restless legs    Sleep apnea    Tendonitis    chronic in left foot   Thyroid  disease    hypothyroidism   Urinary incontinence 02/2022   some   UTI (urinary tract infection)    just dx 04/2022 - rx amoxicillin     Past Surgical History:  Procedure Laterality Date   AMPUTATION Right 04/11/2022   Procedure: RIGHT LONG FINGER AMPUTATION;  Surgeon: Murrell Drivers, MD;  Location: [facility] OR;  Service: Orthopedics;  Laterality: Right;   AXILLARY HIDRADENITIS EXCISION Bilateral    CHOLECYSTECTOMY     HERNIA REPAIR     multiple   INCISION AND DRAINAGE ABSCESS Right 02/06/2022   Procedure: INCISION AND DRAINAGE RIGHT HAND;  Surgeon: Murrell Drivers, MD;  Location: [facility] OR;  Service: Orthopedics;  Laterality: Right;   INCISION AND DRAINAGE WOUND WITH NAILBED REPAIR Left 02/06/2022   Procedure: INCISION AND DRAINAGE LEFT INDEX FINGER;  Surgeon: Murrell Drivers, MD;  Location: [facility] OR;  Service: Orthopedics;  Laterality: Left;   INCISIONAL HERNIA REPAIR N/A 05/30/2015   Procedure: LAPAROSCOPIC INCISIONAL HERNIA;  Surgeon: Donnice Bury, MD;  Location: [facility] OR;  Service: General;  Laterality: N/A;   INSERTION OF MESH N/A 05/30/2015   Procedure: INSERTION OF MESH;  Surgeon: Donnice Bury, MD;  Location: [facility] OR;  Service: General;  Laterality: N/A;   KNEE ARTHROSCOPY     right knee   LAPAROSCOPY N/A 05/30/2015   Procedure: LAPAROSCOPY DIAGNOSTIC;  Surgeon: Donnice Bury, MD;  [facility] OR;  Service: General;  Laterality: N/A;   PARTIAL KNEE ARTHROPLASTY Right 10/30/2017   Procedure: UNICOMPARTMENTAL RIGHT KNEE LATERAL;  Surgeon: Ernie Donnice, MD;  Location: [facility] ORS;  Service: Orthopedics;  Laterality:  Right;  90 mins   PARTIAL KNEE ARTHROPLASTY Left 06/29/2020   Procedure: UNICOMPARTMENTAL KNEE LATERALLY;  Surgeon: Ernie Donnice, MD;  Location: [facility] ORS;  Service: Orthopedics;  Laterality: Left;  70 MINS   TONSILLECTOMY     adenoids    Social History   Tobacco Use   Smoking status: Never    Passive exposure: Never   Smokeless tobacco: Never  Vaping Use   Vaping status: Never Used  Substance Use Topics   Alcohol use: No    Alcohol/week: 0.0 standard drinks of alcohol   Drug use: No    Family History  Problem Relation Age of Onset   Thyroid  cancer Mother    Diabetes type II Mother    High Cholesterol Mother    Heart disease Mother    Diabetes type II Father    Hypertension Father  Stroke Father     Allergies  Allergen Reactions   Vancomycin  Itching   Cefuroxime Axetil Hives   Sulfa Antibiotics Hives    Medication list has been reviewed and updated.  Current Outpatient Medications on File Prior to Visit  Medication Sig Dispense Refill   albuterol  (PROVENTIL ) (2.5 MG/3ML) 0.083% nebulizer solution Take 3 mLs (2.5 mg total) by nebulization every 6 (six) hours as needed for wheezing or shortness of breath. 150 mL 1   albuterol  (VENTOLIN  HFA) 108 (90 Base) MCG/ACT inhaler Inhale 2 puffs into the lungs every 6 (six) hours as needed for wheezing or shortness of breath. 17 each 2   Albuterol  Sulfate (PROAIR  RESPICLICK) 108 (90 Base) MCG/ACT AEPB Inhale 2 puffs every 4-6 hours as needed 1 each 12   allopurinol  (ZYLOPRIM ) 300 MG tablet Take 1 tablet (300 mg total) by mouth daily. 90 tablet 3   benzonatate  (TESSALON ) 100 MG capsule Take 1 capsule (100 mg total) by mouth 3 (three) times daily as needed for cough. 90 capsule 1   Blood Glucose Monitoring Suppl (BLOOD GLUCOSE METER KIT AND SUPPLIES) Dispense based on patient and insurance preference. To check blood sugars daily FOR ICD-9 250.00 1 each 0   cholestyramine  (QUESTRAN ) 4 g packet MIX 1 PACKET IN LIQUID AND DRINK TWICE  DAILY 180 packet 3   Continuous Glucose Sensor (DEXCOM G6 SENSOR) MISC SMARTSIG:1 Each Topical Every 10 Days     Continuous Glucose Transmitter (DEXCOM G6 TRANSMITTER) MISC      diphenhydramine -acetaminophen  (TYLENOL  PM) 25-500 MG TABS tablet Take 500 mg by mouth at bedtime.     fenofibrate  micronized (LOFIBRA) 134 MG capsule Take 134 mg by mouth daily before breakfast.     Fluticasone  Furoate (ARNUITY ELLIPTA ) 100 MCG/ACT AEPB Inhale 1 puff into the lungs daily. 30 each 12   Glucose Blood (BLOOD GLUCOSE TEST STRIPS) STRP 1 each by Other route See admin instructions. Use to test blood sugar up to twice a day 100 strip 3   glucose blood (FREESTYLE TEST STRIPS) test strip Use as directed to test blood sugar up to twice a day. Dx code E11.9 100 each 12   glucose blood (TRUE METRIX BLOOD GLUCOSE TEST) test strip Use as instructed to check glucose up to 2 times daily. Dx Code E11.9 100 each 12   Insulin  Disposable Pump (OMNIPOD 5 DEXG7G6 INTRO GEN 5) KIT SMARTSIG:SUB-Q Every Other Day     Insulin  Disposable Pump (OMNIPOD 5 DEXG7G6 PODS GEN 5) MISC Inject into the skin as directed.     JARDIANCE  25 MG TABS tablet TAKE 1 TABLET EVERY DAY 90 tablet 3   levofloxacin  (LEVAQUIN ) 750 MG tablet Take 1 tablet (750 mg total) by mouth daily. 5 tablet 0   levothyroxine  (SYNTHROID ) 150 MCG tablet TAKE 1 TABLET DAILY BEFORE BREAKFAST (NEED APPOINTMENT AND LABS BEFORE NEXT REFILL) 90 tablet 3   metFORMIN  (GLUCOPHAGE ) 1000 MG tablet TAKE 1 TABLET TWICE DAILY WITH A MEAL (NEED APPOINTMENT AND LABS BEFORE NEXT REFILL) 180 tablet 3   methocarbamol  (ROBAXIN ) 500 MG tablet Take 1 tablet (500 mg total) by mouth every 8 (eight) hours as needed for muscle spasms. 30 tablet 1   MOUNJARO  10 MG/0.5ML Pen Inject into the skin.     naproxen  sodium (ALEVE ) 220 MG tablet Take 440 mg by mouth daily.     norethindrone  (AYGESTIN ) 5 MG tablet TAKE 2 TABLETS BY MOUTH IN THE MORNING AND 1 TABLET BY MOUTH AT BEDTIME. 270 tablet 0  ondansetron  (ZOFRAN ) 4 MG tablet Take 1 tablet (4 mg total) by mouth every 8 (eight) hours as needed for nausea or vomiting. 20 tablet 0   ONE TOUCH ULTRA TEST test strip USE TO CHECK BLOOD SUGAR ONCE DAILY (ICD CODE:25.00) 100 each 3   pantoprazole  (PROTONIX ) 40 MG tablet Take 1 tablet (40 mg total) by mouth daily. Take 30-60 min before first meal of the day 30 tablet 2   Spacer/Aero-Holding Raguel FRENCH Use with inhalers 1 each PRN   TRUEplus Lancets 30G MISC Use as directed to check glucose up to two times daily. Dx code E11.9 100 each 3   No current facility-administered medications on file prior to visit.    Review of Systems:  As per HPI- otherwise negative.   Physical Examination: Vitals:   12/18/23 1027  BP: 124/80  Pulse: 99  SpO2: 99%   Vitals:   12/18/23 1027  Weight: 257 lb 3.2 oz (116.7 kg)  Height: 5' 4 (1.626 m)   Body mass index is 44.15 kg/m. Ideal Body Weight: Weight in (lb) to have BMI = 25: 145.3  GEN: no acute distress.  Obese, history of Down syndrome HEENT: Atraumatic, Normocephalic.  Bilateral TM wnl, oropharynx normal.  PEERL,EOMI.   Ears and Nose: No external deformity. CV: RRR, No M/G/R. No JVD. No thrill. No extra heart sounds. PULM: CTA B, no wheezes, crackles, rhonchi. No retractions. No resp. distress. No accessory muscle use. ABD: S, NT, ND, +BS. No rebound. No HSM. EXTR: No c/c/e PSYCH: Normally interactive. Conversant.  Feet are examined today, no skin breakdown.  Assessment and Plan: Restless legs - Plan: Basic metabolic panel with GFR, Ferritin, Vitamin B12, Magnesium , gabapentin  (NEURONTIN ) 100 MG capsule  Encounter for screening mammogram for malignant neoplasm of breast - Plan: [facility] 3D SCREENING MAMMOGRAM BILATERAL BREAST  Controlled type 2 diabetes mellitus with complication, without long-term current use of insulin  (HCC) - Plan: rosuvastatin  (CRESTOR ) 10 MG tablet  Gout, unspecified cause, unspecified chronicity, unspecified site  - Plan: Uric acid  Chronic diarrhea - Plan: dicyclomine  (BENTYL ) 20 MG tablet  Assessment & Plan Restless legs syndrome Chronic symptoms affecting both legs, worsening at night. Differential includes magnesium , B12, and iron deficiencies. - Ordered blood tests for magnesium , B12, and iron levels. - Prescribed gabapentin  100 mg at bedtime, with option to increase dosage.  Asked her to let me know how this works for her and if any excessive sedation  Screening mammogram for malignant neoplasm of breast Discussed importance of screening due to family history. Reassured about procedure discomfort. - Ordered screening mammogram.  Gout-generally well-controlled allopurinol , check uric acid Diabetes is generally managed by her endocrinologist.  They are complaint with medication.  Complicated by obesity which makes exercise difficult   Noninfective gastroenteritis and colitis, unspecified Intermittent symptoms managed with Bentyl , especially before meals or spicy food. - Prescribed Bentyl  with meals and at bedtime as needed.  Signed Harlene Schroeder, MD Received labs as below, message to pt Corrected sodium 130- 131 Will also give a call in the am  Addnd 11/14- called and spoke with pt mother and sent written copy of labs to her dad who is a primary care doc Recommend 1000 mcg B12 OTC, recheck levels in about 3 months    Results for orders placed or performed in visit on 12/18/23  Basic metabolic panel with GFR   Collection Time: 12/18/23 11:07 AM  Result Value Ref Range   Sodium 127 (L) 135 - 145 mEq/L   Potassium  5.4 (H) 3.5 - 5.1 mEq/L   Chloride 91 (L) 96 - 112 mEq/L   CO2 24 19 - 32 mEq/L   Glucose, Bld 280 (H) 70 - 99 mg/dL   BUN 12 6 - 23 mg/dL   Creatinine, Ser 9.33 0.40 - 1.20 mg/dL   GFR 892.99 >39.99 mL/min   Calcium  9.4 8.4 - 10.5 mg/dL  Ferritin   Collection Time: 12/18/23 11:07 AM  Result Value Ref Range   Ferritin 33.2 10.0 - 291.0 ng/mL  Vitamin B12    Collection Time: 12/18/23 11:07 AM  Result Value Ref Range   Vitamin B-12 112 (L) 211 - 911 pg/mL  Magnesium    Collection Time: 12/18/23 11:07 AM  Result Value Ref Range   Magnesium  1.9 1.5 - 2.5 mg/dL  Uric acid   Collection Time: 12/18/23 11:07 AM  Result Value Ref Range   Uric Acid, Serum 3.5 2.4 - 7.0 mg/dL    "

## 2023-12-16 ENCOUNTER — Ambulatory Visit: Admitting: Occupational Therapy

## 2023-12-17 DIAGNOSIS — M25562 Pain in left knee: Secondary | ICD-10-CM | POA: Diagnosis not present

## 2023-12-17 DIAGNOSIS — Z96652 Presence of left artificial knee joint: Secondary | ICD-10-CM | POA: Diagnosis not present

## 2023-12-17 DIAGNOSIS — Z96653 Presence of artificial knee joint, bilateral: Secondary | ICD-10-CM | POA: Diagnosis not present

## 2023-12-17 DIAGNOSIS — M2242 Chondromalacia patellae, left knee: Secondary | ICD-10-CM | POA: Diagnosis not present

## 2023-12-17 DIAGNOSIS — Z96651 Presence of right artificial knee joint: Secondary | ICD-10-CM | POA: Diagnosis not present

## 2023-12-18 ENCOUNTER — Ambulatory Visit (INDEPENDENT_AMBULATORY_CARE_PROVIDER_SITE_OTHER): Admitting: Family Medicine

## 2023-12-18 ENCOUNTER — Encounter: Payer: Self-pay | Admitting: Family Medicine

## 2023-12-18 VITALS — BP 124/80 | HR 99 | Ht 64.0 in | Wt 257.2 lb

## 2023-12-18 DIAGNOSIS — E118 Type 2 diabetes mellitus with unspecified complications: Secondary | ICD-10-CM

## 2023-12-18 DIAGNOSIS — G2581 Restless legs syndrome: Secondary | ICD-10-CM | POA: Diagnosis not present

## 2023-12-18 DIAGNOSIS — K529 Noninfective gastroenteritis and colitis, unspecified: Secondary | ICD-10-CM | POA: Diagnosis not present

## 2023-12-18 DIAGNOSIS — M109 Gout, unspecified: Secondary | ICD-10-CM

## 2023-12-18 DIAGNOSIS — Z1231 Encounter for screening mammogram for malignant neoplasm of breast: Secondary | ICD-10-CM

## 2023-12-18 LAB — VITAMIN B12: Vitamin B-12: 112 pg/mL — ABNORMAL LOW (ref 211–911)

## 2023-12-18 LAB — BASIC METABOLIC PANEL WITH GFR
BUN: 12 mg/dL (ref 6–23)
CO2: 24 meq/L (ref 19–32)
Calcium: 9.4 mg/dL (ref 8.4–10.5)
Chloride: 91 meq/L — ABNORMAL LOW (ref 96–112)
Creatinine, Ser: 0.66 mg/dL (ref 0.40–1.20)
GFR: 107 mL/min (ref >60.00)
Glucose, Bld: 280 mg/dL — ABNORMAL HIGH (ref 70–99)
Potassium: 5.4 meq/L — ABNORMAL HIGH (ref 3.5–5.1)
Sodium: 127 meq/L — ABNORMAL LOW (ref 135–145)

## 2023-12-18 LAB — FERRITIN: Ferritin: 33.2 ng/mL (ref 10.0–291.0)

## 2023-12-18 LAB — URIC ACID: Uric Acid, Serum: 3.5 mg/dL (ref 2.4–7.0)

## 2023-12-18 LAB — MAGNESIUM: Magnesium: 1.9 mg/dL (ref 1.5–2.5)

## 2023-12-18 MED ORDER — DICYCLOMINE HCL 20 MG PO TABS: 20.0000 mg | ORAL_TABLET | Freq: Three times a day (TID) | ORAL | 3 refills | Status: AC | PRN

## 2023-12-18 MED ORDER — ROSUVASTATIN CALCIUM 10 MG PO TABS: 10.0000 mg | ORAL_TABLET | Freq: Every day | ORAL | 3 refills | Status: AC

## 2023-12-18 MED ORDER — GABAPENTIN 100 MG PO CAPS
100.0000 mg | ORAL_CAPSULE | Freq: Every day | ORAL | 1 refills | Status: AC
Start: 2023-12-18 — End: ?

## 2023-12-18 NOTE — Patient Instructions (Addendum)
 It was great to see you today I will be in touch with your labs I would like you to get a prevnar vaccine if we think safe- I will touch base with your dad about it Try the gabapentin at bedtime for your restless legs  Stop by the imaging dept on the ground floor to get your mammogram set up

## 2023-12-20 DIAGNOSIS — E1165 Type 2 diabetes mellitus with hyperglycemia: Secondary | ICD-10-CM | POA: Diagnosis not present

## 2023-12-23 DIAGNOSIS — E781 Pure hyperglyceridemia: Secondary | ICD-10-CM | POA: Diagnosis not present

## 2023-12-23 DIAGNOSIS — E039 Hypothyroidism, unspecified: Secondary | ICD-10-CM | POA: Diagnosis not present

## 2023-12-23 DIAGNOSIS — E1165 Type 2 diabetes mellitus with hyperglycemia: Secondary | ICD-10-CM | POA: Diagnosis not present

## 2023-12-23 DIAGNOSIS — E7800 Pure hypercholesterolemia, unspecified: Secondary | ICD-10-CM | POA: Diagnosis not present

## 2023-12-29 ENCOUNTER — Other Ambulatory Visit: Payer: Self-pay | Admitting: Family Medicine

## 2023-12-29 ENCOUNTER — Encounter: Payer: Self-pay | Admitting: Family Medicine

## 2023-12-29 DIAGNOSIS — E118 Type 2 diabetes mellitus with unspecified complications: Secondary | ICD-10-CM

## 2023-12-29 MED ORDER — LOSARTAN POTASSIUM 25 MG PO TABS: 12.5000 mg | ORAL_TABLET | Freq: Every day | ORAL | 3 refills | Status: AC

## 2023-12-29 NOTE — Progress Notes (Signed)
 BP Readings from Last 3 Encounters:  12/18/23 124/80  11/27/22 124/78  08/21/22 134/84

## 2024-01-06 ENCOUNTER — Ambulatory Visit: Admitting: Occupational Therapy

## 2024-01-06 DIAGNOSIS — M6281 Muscle weakness (generalized): Secondary | ICD-10-CM | POA: Diagnosis present

## 2024-01-06 DIAGNOSIS — R278 Other lack of coordination: Secondary | ICD-10-CM | POA: Diagnosis present

## 2024-01-06 DIAGNOSIS — R208 Other disturbances of skin sensation: Secondary | ICD-10-CM | POA: Insufficient documentation

## 2024-01-06 DIAGNOSIS — M25642 Stiffness of left hand, not elsewhere classified: Secondary | ICD-10-CM | POA: Insufficient documentation

## 2024-01-06 DIAGNOSIS — M25641 Stiffness of right hand, not elsewhere classified: Secondary | ICD-10-CM | POA: Diagnosis present

## 2024-01-06 NOTE — Patient Instructions (Signed)
 fingertip protector  Wear your fingertip protector for an hour or 2 during the day with tasks which you think you may bump your middle finger, remove fingetip protector if increased pain or if it feels too tight. I noticed you have a small cut on your right middle fingetip. Make sure to remove bandaide and wash your fingertip with soap and water and dry thoroughly.

## 2024-01-06 NOTE — Therapy (Signed)
 OUTPATIENT OCCUPATIONAL THERAPY ORTHO treatment  Patient Name: Wanda Oneill MRN: [id number] DOB:1980/11/13, 43 y.o., female Today's Date: 01/06/2024  PCP: Dr. Harlene Copland REFERRING PROVIDERDr. Harlene Copland  END OF SESSION:  OT End of Session - 01/06/24 1514     Visit Number 5    Number of Visits 12    Date for Recertification  02/05/24    Authorization Type [employer]    Authorization - Visit Number 4    Progress Note Due on Visit 10    OT Start Time 1447    OT Stop Time 1525    OT Time Calculation (min) 38 min    Activity Tolerance Patient tolerated treatment well    Behavior During Therapy WFL for tasks assessed/performed          Past Medical History:  Diagnosis Date   Ambulates with cane    4 prong   Diabetes mellitus    type II   Down syndrome    Down's syndrome    Family history of adverse reaction to anesthesia    mom has had n/v   Gastritis    Headache    Hepatic steatosis    Hidradenitis    Hypothyroidism    Irritable bowel syndrome    Osteoarthritis    Periumbilical hernia    Pneumonia 03/2020   frequent    Restless legs    Sleep apnea    Tendonitis    chronic in left foot   Thyroid  disease    hypothyroidism   Urinary incontinence 02/2022   some   UTI (urinary tract infection)    just dx 04/2022 - rx amoxicillin    Past Surgical History:  Procedure Laterality Date   AMPUTATION Right 04/11/2022   Procedure: RIGHT LONG FINGER AMPUTATION;  Surgeon: Murrell Drivers, MD;  Location: [facility] OR;  Service: Orthopedics;  Laterality: Right;   AXILLARY HIDRADENITIS EXCISION Bilateral    CHOLECYSTECTOMY     HERNIA REPAIR     multiple   INCISION AND DRAINAGE ABSCESS Right 02/06/2022   Procedure: INCISION AND DRAINAGE RIGHT HAND;  Surgeon: Murrell Drivers, MD;  Location: [facility] OR;  Service: Orthopedics;  Laterality: Right;   INCISION AND DRAINAGE WOUND WITH NAILBED REPAIR Left 02/06/2022   Procedure: INCISION AND DRAINAGE LEFT INDEX FINGER;  Surgeon:  Murrell Drivers, MD;  Location: [facility] OR;  Service: Orthopedics;  Laterality: Left;   INCISIONAL HERNIA REPAIR N/A 05/30/2015   Procedure: LAPAROSCOPIC INCISIONAL HERNIA;  Surgeon: Donnice Bury, MD;  Location: [facility] OR;  Service: General;  Laterality: N/A;   INSERTION OF MESH N/A 05/30/2015   Procedure: INSERTION OF MESH;  Surgeon: Donnice Bury, MD;  [facility];  Service: General;  Laterality: N/A;   KNEE ARTHROSCOPY     right knee   LAPAROSCOPY N/A 05/30/2015   Procedure: LAPAROSCOPY DIAGNOSTIC;  Surgeon: Donnice Bury, MD;  Location: [facility] OR;  Service: General;  Laterality: N/A;   PARTIAL KNEE ARTHROPLASTY Right 10/30/2017   Procedure: UNICOMPARTMENTAL RIGHT KNEE LATERAL;  Surgeon: Ernie Donnice, MD;  Location: [facility] ORS;  Service: Orthopedics;  Laterality: Right;  90 mins   PARTIAL KNEE ARTHROPLASTY Left 06/29/2020   Procedure: UNICOMPARTMENTAL KNEE LATERALLY;  Surgeon: Ernie Donnice, MD;  Location: [facility] OR[facility];  Service: Orthopedics;  Laterality: Left;  70 MINS   TONSILLECTOMY     adenoids   Patient Active Problem List   Diagnosis Date Noted   OSA on CPAP 12/12/2022   DOE (dyspnea on exertion) 08/21/2022   Furuncles 02/08/2022  Infection of flexor tendon sheath 02/06/2022   Finger osteomyelitis, right (HCC) 02/06/2022   Leukocytosis 02/06/2022   History of COVID-19 02/06/2022   IBS (irritable bowel syndrome) 02/06/2022   Hypertriglyceridemia 02/06/2022   Obesity, Class III, BMI 40-49.9 (morbid obesity) (HCC) 02/06/2022   Lumbar facet joint syndrome 11/02/2021   Bilateral trigger thumb 06/18/2021   Chronic paronychia of finger 01/25/2021   S/P left unicompartmental knee replacement, lateral 06/29/2020   At risk for obstructive sleep apnea 06/11/2019   Cough 06/11/2019   Hyperglycemia 05/26/2019   Acute hypoxemic respiratory failure (HCC) 05/20/2019   Fibromyalgia 03/09/2019   Neck pain, chronic 03/09/2019   Acute shoulder bursitis, right 02/03/2019   Bile salt-induced  diarrhea 09/25/2018   Arthritis of left acromioclavicular joint 09/23/2018   Precordial chest pain 08/11/2018   Morbid obesity (HCC) 10/31/2017   S/P right UKR, lateral 10/30/2017   Incarcerated epigastric hernia 05/30/2015   Left foot pain 08/09/2014   Chronic diarrhea 07/27/2014   Gout 08/10/2012   Acid reflux 01/22/2012   Down syndrome 11/11/2011   Hernia of abdominal wall 10/21/2011   Psoriasis 10/21/2011   Uncontrolled type 2 diabetes mellitus with hyperglycemia, with long-term current use of insulin  (HCC) 04/12/2011   Hypothyroid 04/12/2011   Gait abnormality 06/22/2010   Tibial tendinitis, posterior 06/22/2010    ONSET DATE: 10/30/23- referral date  REFERRING DIAG:  Diagnosis  M86.9 (ICD-10-CM) - Finger osteomyelitis, right (HCC)    THERAPY DIAG:  Stiffness of right hand, not elsewhere classified  Other disturbances of skin sensation  Other lack of coordination  Rationale for Evaluation and Treatment: Rehabilitation  SUBJECTIVE:   SUBJECTIVE STATEMENT: Pt reports her hand is alittle stiff Pt accompanied by: self  PERTINENT HISTORY: hx of R long finger amputation through distal phalanx 04/11/22, healing wound at the edge of nail left long finger which is now closed PMH: Down's syndrome, DM   PRECAUTIONS: None  RED FLAGS: None   WEIGHT BEARING RESTRICTIONS: No  PAIN:  Are you having pain? Yes: NPRS scale: 2-3/10 Pain location: right middle finger Pain description: aching Aggravating factors: use, weather changes Relieving factors: rest Pt reports left shoulder pain today 6/10, heat alleviates overuse aggravates.  FALLS: Has patient fallen in last 6 months? Yes. Number of falls 1- PT addessed  LIVING ENVIRONMENT: Lives with: lives with their family and lives alone Lives in: House/apartment   PLOF: Independent with basic ADLs  PATIENT GOALS: improve use of right middle finger  NEXT MD VISIT: unknown  OBJECTIVE:  Note: Objective measures were  completed at Evaluation unless otherwise noted.  HAND DOMINANCE: Right  ADLs: Overall ADLs: mod I with all basic ADLS  FUNCTIONAL OUTCOME MEASURES: PSFS-5.3 Items-Pick up pills 5, cut food, 6, stirring batter and using RUE for baking 5  UPPER EXTREMITY ROM:      Active ROM Right eval   Thumb MCP (0-60)    Thumb IP (0-80)    Thumb Radial abd/add (0-55)    Thumb Palmar abd/add (0-45)     Thumb Opposition to Small Finger     Index MCP (0-90)     Index PIP (0-100)     Index DIP (0-70)      Long MCP (0-90) 80     Long PIP (0-100) 70     Long DIP (0-70)      Ring MCP (0-90)      Ring PIP (0-100)      Ring DIP (0-70)      Little MCP (0-90)  Little PIP (0-100)      Little DIP (0-70)      (Blank rows = not tested)   HAND FUNCTION: Grip strength: Right: 20 lbs; Left: 34 lbs  COORDINATION: 9 Hole Peg test: Right: 1 min 4 secs sec; Left: 1 min 8  sec   SENSATION: Light touch: Impaired in fingertip   EDEMA: Pt with R middle finger amputation at distal phalanx  COGNITION: Overall cognitive status: History of cognitive impairments - at baseline, mild impairment   OBSERVATIONS: Pleasant female    TREATMENT DATE: 01/06/24- Hotpack applied to right hand x6 mins  while therapist initated fabrication of fingertip protector. no adverse reactions to heat. Pt was fitted with an updated fingertip protector that fits better for right middle finger and instructed in wear/ care, and precautions.. Previously issued fingertip protecor would not stay on. Pt was noted to have cut on middle finger band aide applied prior to functional activity. Composite finger flexion/ extension followed by functional use of RUE to place and remove metal pegs from pegboad with RUE, min-mod v.c and demonstration for 3 pt pinch.  12/09/23 hotpack applied to right hand x 5 mins and left shoulder x 20 mins for pain and stiffness. No adverse reactions A/ROM exercises for PIP blocking, and active and  passive composite finger flexion,   Pt performed red putty HEP, for composite grip and tip pinch min v.c and demonstration for RUE Pt was fitted with a fingertip protector splint for right middle finger and instructed in wear/ care, and precautions.  12/02/23 hotpack applied to right hand x 6 mins then left shoulder x 20 mins for pain and stiffness.  A/ROM exercises for PIP blocking, and active and passive composite finger flexion,   Pt performed red putty HEP, for composite grip and tip pinch min v.c and demonstration Flipping playing cards with RUE for increased functional use. Foam grip applied to pen and pt practiced using for writing with improved legibility, min v.c for hand placement.  11/26/23- hotpack applied to right hand and left shoulder x 8 mins for pain and stiffness.  A/ROM exercises for PIP blocking, and active and passive composite finger flexion, place and holds  Pt was instructed in red putty HEP, min v.c and demonstration   11/13/23- inital eval                                                                                                                                 PATIENT EDUCATION: Education details: updated fingertip protector splint Person educated: Patient Education method: Explanation, demonstration, v.c, handout  Education comprehension: verbalized understanding, returned demonstraion, needs review  HOME EXERCISE PROGRAM: inital HEP 11/13/23\ putty  GOALS: Goals reviewed with patient? Yes  SHORT TERM GOALS: Target date: 12/13/23  I with inital HEP  Goal status:met, 12/02/23  2.  Pt will increase RUE middle finger PIP flexion to 75* for increased functional use.  Goal status:  ongoing 60*,  12/09/23  3.  Pt will improve RUE grip strength to 25# or better for increased functional use. Baseline: 20# Goal status: ongoing, 20# 12/09/23  4.  I with adapted strategies and adapted equipment to maximize pt's safety and I with ADLs/IADLs.   Goal  status: ongoing adapted pen issued  12/09/23    LONG TERM GOALS: Target date: 02/05/24  I with updated HEP  Goal status: INITIAL  2.  Pt will demonstrate improved RUE functional use as evidenced by increasing PSFS to 7. Baseline: 5.3 Goal status: INITIAL  3.  I with R fingertip protector splint wear/ care and prec.  Baseline: dependnent Goal status: 12/09/23- splint issued, ongoing    ASSESSMENT:  CLINICAL IMPRESSION:Pt is progressing towards goals. She verbalizes understanding of updated fingertip protector splint wear, care and precuations. Pt will benefit from continued functional use of RUE.   PERFORMANCE DEFICITS: in functional skills including ADLs, IADLs, coordination, dexterity, sensation, edema, ROM, strength, pain, flexibility, Fine motor control, Gross motor control, decreased knowledge of precautions, decreased knowledge of use of DME, and UE functional use, cognitive skills including problem solving, thought, and understand, and psychosocial skills including coping strategies, environmental adaptation, habits, interpersonal interactions, and routines and behaviors.   IMPAIRMENTS: are limiting patient from ADLs, IADLs, play, leisure, and social participation.   COMORBIDITIES: may have co-morbidities  that affects occupational performance. Patient will benefit from skilled OT to address above impairments and improve overall function.  MODIFICATION OR ASSISTANCE TO COMPLETE EVALUATION: No modification of tasks or assist necessary to complete an evaluation.  OT OCCUPATIONAL PROFILE AND HISTORY: Detailed assessment: Review of records and additional review of physical, cognitive, psychosocial history related to current functional performance.  CLINICAL DECISION MAKING: LOW - limited treatment options, no task modification necessary  REHAB POTENTIAL: Good  EVALUATION COMPLEXITY: Low      PLAN:  OT FREQUENCY: 1x/week  OT DURATION: 12 weeks  PLANNED INTERVENTIONS:  97168 OT Re-evaluation, 97535 self care/ADL training, 02889 therapeutic exercise, 97530 therapeutic activity, 97112 neuromuscular re-education, 97140 manual therapy, 97035 ultrasound, 97018 paraffin, 02989 moist heat, 97010 cryotherapy, 97014 electrical stimulation unattended, 97760 Orthotic Initial, 97763 Orthotic/Prosthetic subsequent, scar mobilization, passive range of motion, energy conservation, coping strategies training, patient/family education, and DME and/or AE instructions  RECOMMENDED OTHER SERVICES: n/a  CONSULTED AND AGREED WITH PLAN OF CARE:   PLAN FOR NEXT SESSION: progress HEP, functional use of RUE, check on fingertip protector   Tatia Petrucci, OT 01/06/2024, 4:56 PM

## 2024-01-12 ENCOUNTER — Encounter: Payer: Self-pay | Admitting: Family Medicine

## 2024-01-13 ENCOUNTER — Ambulatory Visit: Admitting: Occupational Therapy

## 2024-01-15 ENCOUNTER — Inpatient Hospital Stay (HOSPITAL_BASED_OUTPATIENT_CLINIC_OR_DEPARTMENT_OTHER): Admission: RE | Admit: 2024-01-15 | Source: Ambulatory Visit

## 2024-01-16 ENCOUNTER — Ambulatory Visit (HOSPITAL_BASED_OUTPATIENT_CLINIC_OR_DEPARTMENT_OTHER): Admitting: Obstetrics & Gynecology

## 2024-01-20 ENCOUNTER — Ambulatory Visit: Admitting: Occupational Therapy

## 2024-01-20 DIAGNOSIS — R208 Other disturbances of skin sensation: Secondary | ICD-10-CM

## 2024-01-20 DIAGNOSIS — M6281 Muscle weakness (generalized): Secondary | ICD-10-CM

## 2024-01-20 DIAGNOSIS — M25641 Stiffness of right hand, not elsewhere classified: Secondary | ICD-10-CM | POA: Diagnosis not present

## 2024-01-20 DIAGNOSIS — M25642 Stiffness of left hand, not elsewhere classified: Secondary | ICD-10-CM

## 2024-01-20 DIAGNOSIS — R278 Other lack of coordination: Secondary | ICD-10-CM

## 2024-01-20 NOTE — Therapy (Signed)
 OUTPATIENT OCCUPATIONAL THERAPY ORTHO treatment  Patient Name: Wanda Oneill MRN: [id number] DOB:1980-08-07, 43 y.o., female Today's Date: 01/20/2024  PCP: Dr. Harlene Copland REFERRING PROVIDERDr. Harlene Copland  END OF SESSION:  OT End of Session - 01/20/24 1323     Visit Number 6    Number of Visits 12    Date for Recertification  02/05/24    Authorization Type [employer]    Authorization - Visit Number 5    Progress Note Due on Visit 10    OT Start Time 1315    OT Stop Time 1358    OT Time Calculation (min) 43 min    Activity Tolerance Patient tolerated treatment well    Behavior During Therapy WFL for tasks assessed/performed          Past Medical History:  Diagnosis Date   Ambulates with cane    4 prong   Diabetes mellitus    type II   Down syndrome    Down's syndrome    Family history of adverse reaction to anesthesia    mom has had n/v   Gastritis    Headache    Hepatic steatosis    Hidradenitis    Hypothyroidism    Irritable bowel syndrome    Osteoarthritis    Periumbilical hernia    Pneumonia 03/2020   frequent    Restless legs    Sleep apnea    Tendonitis    chronic in left foot   Thyroid  disease    hypothyroidism   Urinary incontinence 02/2022   some   UTI (urinary tract infection)    just dx 04/2022 - rx amoxicillin    Past Surgical History:  Procedure Laterality Date   AMPUTATION Right 04/11/2022   Procedure: RIGHT LONG FINGER AMPUTATION;  Surgeon: Murrell Drivers, MD;  Location: [facility] OR;  Service: Orthopedics;  Laterality: Right;   AXILLARY HIDRADENITIS EXCISION Bilateral    CHOLECYSTECTOMY     HERNIA REPAIR     multiple   INCISION AND DRAINAGE ABSCESS Right 02/06/2022   Procedure: INCISION AND DRAINAGE RIGHT HAND;  Surgeon: Murrell Drivers, MD;  Location: [facility] OR;  Service: Orthopedics;  Laterality: Right;   INCISION AND DRAINAGE WOUND WITH NAILBED REPAIR Left 02/06/2022   Procedure: INCISION AND DRAINAGE LEFT INDEX FINGER;  Surgeon:  Murrell Drivers, MD;  Location: [facility] OR;  Service: Orthopedics;  Laterality: Left;   INCISIONAL HERNIA REPAIR N/A 05/30/2015   Procedure: LAPAROSCOPIC INCISIONAL HERNIA;  Surgeon: Donnice Bury, MD;  Location: [facility] OR;  Service: General;  Laterality: N/A;   INSERTION OF MESH N/A 05/30/2015   Procedure: INSERTION OF MESH;  Surgeon: Donnice Bury, MD;  Location: [facility] OR;  Service: General;  Laterality: N/A;   KNEE ARTHROSCOPY     right knee   LAPAROSCOPY N/A 05/30/2015   Procedure: LAPAROSCOPY DIAGNOSTIC;  Surgeon: Donnice Bury, MD;  Location: [facility] OR;  Service: General;  Laterality: N/A;   PARTIAL KNEE ARTHROPLASTY Right 10/30/2017   Procedure: UNICOMPARTMENTAL RIGHT KNEE LATERAL;  Surgeon: Ernie Donnice, MD;  Location: [facility] ORS;  Service: Orthopedics;  Laterality: Right;  90 mins   PARTIAL KNEE ARTHROPLASTY Left 06/29/2020   Procedure: UNICOMPARTMENTAL KNEE LATERALLY;  Surgeon: Ernie Donnice, MD;  Location: [facility] OR[facility];  Service: Orthopedics;  Laterality: Left;  70 MINS   TONSILLECTOMY     adenoids   Patient Active Problem List   Diagnosis Date Noted   OSA on CPAP 12/12/2022   DOE (dyspnea on exertion) 08/21/2022   Furuncles 02/08/2022  Infection of flexor tendon sheath 02/06/2022   Finger osteomyelitis, right (HCC) 02/06/2022   Leukocytosis 02/06/2022   History of COVID-19 02/06/2022   IBS (irritable bowel syndrome) 02/06/2022   Hypertriglyceridemia 02/06/2022   Obesity, Class III, BMI 40-49.9 (morbid obesity) (HCC) 02/06/2022   Lumbar facet joint syndrome 11/02/2021   Bilateral trigger thumb 06/18/2021   Chronic paronychia of finger 01/25/2021   S/P left unicompartmental knee replacement, lateral 06/29/2020   At risk for obstructive sleep apnea 06/11/2019   Cough 06/11/2019   Hyperglycemia 05/26/2019   Acute hypoxemic respiratory failure (HCC) 05/20/2019   Fibromyalgia 03/09/2019   Neck pain, chronic 03/09/2019   Acute shoulder bursitis, right 02/03/2019   Bile salt-induced  diarrhea 09/25/2018   Arthritis of left acromioclavicular joint 09/23/2018   Precordial chest pain 08/11/2018   Morbid obesity (HCC) 10/31/2017   S/P right UKR, lateral 10/30/2017   Incarcerated epigastric hernia 05/30/2015   Left foot pain 08/09/2014   Chronic diarrhea 07/27/2014   Gout 08/10/2012   Acid reflux 01/22/2012   Down syndrome 11/11/2011   Hernia of abdominal wall 10/21/2011   Psoriasis 10/21/2011   Uncontrolled type 2 diabetes mellitus with hyperglycemia, with long-term current use of insulin  (HCC) 04/12/2011   Hypothyroid 04/12/2011   Gait abnormality 06/22/2010   Tibial tendinitis, posterior 06/22/2010    ONSET DATE: 10/30/23- referral date  REFERRING DIAG:  Diagnosis  M86.9 (ICD-10-CM) - Finger osteomyelitis, right (HCC)    THERAPY DIAG:  Stiffness of right hand, not elsewhere classified  Other disturbances of skin sensation  Other lack of coordination  Muscle weakness (generalized)  Stiffness of left hand, not elsewhere classified  Rationale for Evaluation and Treatment: Rehabilitation  SUBJECTIVE:   SUBJECTIVE STATEMENT: Pt reports her hand is alittle stiff Pt accompanied by: self  PERTINENT HISTORY: hx of R long finger amputation through distal phalanx 04/11/22, healing wound at the edge of nail left long finger which is now closed PMH: Down's syndrome, DM   PRECAUTIONS: None  RED FLAGS: None   WEIGHT BEARING RESTRICTIONS: No  PAIN:  Are you having pain? Yes: NPRS scale: 2-3/10 Pain location: right middle finger Pain description: aching Aggravating factors: use, weather changes Relieving factors: rest Pt reports left shoulder pain today 6/10, heat alleviates overuse aggravates.  FALLS: Has patient fallen in last 6 months? Yes. Number of falls 1- PT addessed  LIVING ENVIRONMENT: Lives with: lives with their family and lives alone Lives in: House/apartment   PLOF: Independent with basic ADLs  PATIENT GOALS: improve use of right  middle finger  NEXT MD VISIT: unknown  OBJECTIVE:  Note: Objective measures were completed at Evaluation unless otherwise noted.  HAND DOMINANCE: Right  ADLs: Overall ADLs: mod I with all basic ADLS  FUNCTIONAL OUTCOME MEASURES: PSFS-5.3 Items-Pick up pills 5, cut food, 6, stirring batter and using RUE for baking 5  UPPER EXTREMITY ROM:      Active ROM Right eval   Thumb MCP (0-60)    Thumb IP (0-80)    Thumb Radial abd/add (0-55)    Thumb Palmar abd/add (0-45)     Thumb Opposition to Small Finger     Index MCP (0-90)     Index PIP (0-100)     Index DIP (0-70)      Long MCP (0-90) 80     Long PIP (0-100) 70     Long DIP (0-70)      Ring MCP (0-90)      Ring PIP (0-100)  Ring DIP (0-70)      Little MCP (0-90)      Little PIP (0-100)      Little DIP (0-70)      (Blank rows = not tested)   HAND FUNCTION: Grip strength: Right: 20 lbs; Left: 34 lbs  COORDINATION: 9 Hole Peg test: Right: 1 min 4 secs sec; Left: 1 min 8  sec   SENSATION: Light touch: Impaired in fingertip   EDEMA: Pt with R middle finger amputation at distal phalanx  COGNITION: Overall cognitive status: History of cognitive impairments - at baseline, mild impairment   OBSERVATIONS: Pleasant female    TREATMENT DATE: 01/20/24- hotpack x 5 mins to right hand for stiffness and pain, no adverse reactions Composite finger flexion for RUE, followed by PIP blocking for middle finger, min v.c  Massage to right middle finger  and palm for desensitization  Reveiwed putty exercises for sustained grip and pinch, min v.c and demonstration Therapist placed pegs in pt's putty and had her use RUE to pinch and grip putty to remove pegs, min difficulty/ v.c for increased use of middle finger. (hotpack applied to left shoulder per pt's request x 15 mins  during these activities no adverse reactions. Picking up checkers using middle and ring  for increased fine motor coordination and functional use, min  v.c    01/06/24- Hotpack applied to right hand x6 mins  while therapist initated fabrication of fingertip protector. no adverse reactions to heat. Pt was fitted with an updated fingertip protector that fits better for right middle finger and instructed in wear/ care, and precautions.. Previously issued fingertip protecor would not stay on. Pt was noted to have cut on middle finger band aide applied prior to functional activity. Composite finger flexion/ extension followed by functional use of RUE to place and remove metal pegs from pegboard with RUE, min-mod v.c and demonstration for 3 pt pinch.  12/09/23 hotpack applied to right hand x 5 mins and left shoulder x 20 mins for pain and stiffness. No adverse reactions A/ROM exercises for PIP blocking, and active and passive composite finger flexion,   Pt performed red putty HEP, for composite grip and tip pinch min v.c and demonstration for RUE Pt was fitted with a fingertip protector splint for right middle finger and instructed in wear/ care, and precautions.  12/02/23 hotpack applied to right hand x 6 mins then left shoulder x 20 mins for pain and stiffness.  A/ROM exercises for PIP blocking, and active and passive composite finger flexion,   Pt performed red putty HEP, for composite grip and tip pinch min v.c and demonstration Flipping playing cards with RUE for increased functional use. Foam grip applied to pen and pt practiced using for writing with improved legibility, min v.c for hand placement.  11/26/23- hotpack applied to right hand and left shoulder x 8 mins for pain and stiffness.  A/ROM exercises for PIP blocking, and active and passive composite finger flexion, place and holds  Pt was instructed in red putty HEP, min v.c and demonstration   11/13/23- inital eval  PATIENT EDUCATION: Education  details:see above Person educated: Patient Education method: Explanation, demonstration, v.c, handout  Education comprehension: verbalized understanding, returned demonstration, needs review  HOME EXERCISE PROGRAM: inital HEP 11/13/23\ putty  GOALS: Goals reviewed with patient? Yes  SHORT TERM GOALS: Target date: 12/13/23  I with inital HEP  Goal status:met, 12/02/23  2.  Pt will increase RUE middle finger PIP flexion to 75* for increased functional use.  Goal status:  ongoing 60*, 01/20/24  3.  Pt will improve RUE grip strength to 25# or better for increased functional use. Baseline: 20# Goal status: ongoing, 20# 12/09/23  4.  I with adapted strategies and adapted equipment to maximize pt's safety and I with ADLs/IADLs.   Goal status: ongoing adapted pen issued  01/20/24    LONG TERM GOALS: Target date: 02/05/24  I with updated HEP  Goal status: ongoing 01/20/24  2.  Pt will demonstrate improved RUE functional use as evidenced by increasing PSFS to 7. Baseline: 5.3 Goal status: ongoing 01/20/24  3.  I with R fingertip protector splint wear/ care and prec.  Baseline: dependnent Goal status: 12/16/25splint issued, ongoing    ASSESSMENT:  CLINICAL IMPRESSION:Pt is progressing towards goals. She reports fingertip protector keeps falling off. She was instructed to bring it in next visit for adjustment. Pt demonstrates good functional use of RUE today. PERFORMANCE DEFICITS: in functional skills including ADLs, IADLs, coordination, dexterity, sensation, edema, ROM, strength, pain, flexibility, Fine motor control, Gross motor control, decreased knowledge of precautions, decreased knowledge of use of DME, and UE functional use, cognitive skills including problem solving, thought, and understand, and psychosocial skills including coping strategies, environmental adaptation, habits, interpersonal interactions, and routines and behaviors.   IMPAIRMENTS: are limiting patient from  ADLs, IADLs, play, leisure, and social participation.   COMORBIDITIES: may have co-morbidities  that affects occupational performance. Patient will benefit from skilled OT to address above impairments and improve overall function.  MODIFICATION OR ASSISTANCE TO COMPLETE EVALUATION: No modification of tasks or assist necessary to complete an evaluation.  OT OCCUPATIONAL PROFILE AND HISTORY: Detailed assessment: Review of records and additional review of physical, cognitive, psychosocial history related to current functional performance.  CLINICAL DECISION MAKING: LOW - limited treatment options, no task modification necessary  REHAB POTENTIAL: Good  EVALUATION COMPLEXITY: Low      PLAN:  OT FREQUENCY: 1x/week  OT DURATION: 12 weeks  PLANNED INTERVENTIONS: 97168 OT Re-evaluation, 97535 self care/ADL training, 02889 therapeutic exercise, 97530 therapeutic activity, 97112 neuromuscular re-education, 97140 manual therapy, 97035 ultrasound, 97018 paraffin, 02989 moist heat, 97010 cryotherapy, 97014 electrical stimulation unattended, 97760 Orthotic Initial, 97763 Orthotic/Prosthetic subsequent, scar mobilization, passive range of motion, energy conservation, coping strategies training, patient/family education, and DME and/or AE instructions  RECOMMENDED OTHER SERVICES: n/a  CONSULTED AND AGREED WITH PLAN OF CARE:   PLAN FOR NEXT SESSION: check ROM, progress HEP, functional use of RUE, modify finger tip protector prn   Lillar Bianca, OT 01/20/2024, 1:25 PM

## 2024-01-22 ENCOUNTER — Encounter (HOSPITAL_BASED_OUTPATIENT_CLINIC_OR_DEPARTMENT_OTHER): Payer: Self-pay

## 2024-01-22 ENCOUNTER — Inpatient Hospital Stay (HOSPITAL_BASED_OUTPATIENT_CLINIC_OR_DEPARTMENT_OTHER): Admission: RE | Admit: 2024-01-22 | Discharge: 2024-01-22 | Attending: Family Medicine | Admitting: Family Medicine

## 2024-01-22 DIAGNOSIS — Z1231 Encounter for screening mammogram for malignant neoplasm of breast: Secondary | ICD-10-CM | POA: Diagnosis present

## 2024-01-27 ENCOUNTER — Ambulatory Visit: Admitting: Occupational Therapy

## 2024-01-27 DIAGNOSIS — M25641 Stiffness of right hand, not elsewhere classified: Secondary | ICD-10-CM

## 2024-01-27 DIAGNOSIS — M25642 Stiffness of left hand, not elsewhere classified: Secondary | ICD-10-CM

## 2024-01-27 DIAGNOSIS — R278 Other lack of coordination: Secondary | ICD-10-CM

## 2024-01-27 DIAGNOSIS — R208 Other disturbances of skin sensation: Secondary | ICD-10-CM

## 2024-01-27 DIAGNOSIS — M6281 Muscle weakness (generalized): Secondary | ICD-10-CM

## 2024-01-27 NOTE — Therapy (Signed)
 " OUTPATIENT OCCUPATIONAL THERAPY ORTHO treatment  Patient Name: Wanda Oneill MRN: [id number] DOB:1980-07-15, 43 y.o., female Today's Date: 01/27/2024  PCP: Dr. Harlene Copland REFERRING PROVIDERDr. Harlene Copland  END OF SESSION:  OT End of Session - 01/27/24 1607     Visit Number 7    Number of Visits 12    Date for Recertification  02/05/24    Authorization Type [employer]    Authorization - Visit Number 6    Progress Note Due on Visit 10    OT Start Time 1532    OT Stop Time 1615    OT Time Calculation (min) 43 min           Past Medical History:  Diagnosis Date   Ambulates with cane    4 prong   Diabetes mellitus    type II   Down syndrome    Down's syndrome    Family history of adverse reaction to anesthesia    mom has had n/v   Gastritis    Headache    Hepatic steatosis    Hidradenitis    Hypothyroidism    Irritable bowel syndrome    Osteoarthritis    Periumbilical hernia    Pneumonia 03/2020   frequent    Restless legs    Sleep apnea    Tendonitis    chronic in left foot   Thyroid  disease    hypothyroidism   Urinary incontinence 02/2022   some   UTI (urinary tract infection)    just dx 04/2022 - rx amoxicillin    Past Surgical History:  Procedure Laterality Date   AMPUTATION Right 04/11/2022   Procedure: RIGHT LONG FINGER AMPUTATION;  Surgeon: Murrell Drivers, MD;  Location: [facility] OR;  Service: Orthopedics;  Laterality: Right;   AXILLARY HIDRADENITIS EXCISION Bilateral    CHOLECYSTECTOMY     HERNIA REPAIR     multiple   INCISION AND DRAINAGE ABSCESS Right 02/06/2022   Procedure: INCISION AND DRAINAGE RIGHT HAND;  Surgeon: Murrell Drivers, MD;  Location: [facility] OR;  Service: Orthopedics;  Laterality: Right;   INCISION AND DRAINAGE WOUND WITH NAILBED REPAIR Left 02/06/2022   Procedure: INCISION AND DRAINAGE LEFT INDEX FINGER;  Surgeon: Murrell Drivers, MD;  Location: [facility] OR;  Service: Orthopedics;  Laterality: Left;   INCISIONAL HERNIA REPAIR N/A  05/30/2015   Procedure: LAPAROSCOPIC INCISIONAL HERNIA;  Surgeon: Donnice Bury, MD;  Location: [facility] OR;  Service: General;  Laterality: N/A;   INSERTION OF MESH N/A 05/30/2015   Procedure: INSERTION OF MESH;  Surgeon: Donnice Bury, MD;  Location: [facility] OR;  Service: General;  Laterality: N/A;   KNEE ARTHROSCOPY     right knee   LAPAROSCOPY N/A 05/30/2015   Procedure: LAPAROSCOPY DIAGNOSTIC;  Surgeon: Donnice Bury, MD;  Location: [facility] OR;  Service: General;  Laterality: N/A;   PARTIAL KNEE ARTHROPLASTY Right 10/30/2017   Procedure: UNICOMPARTMENTAL RIGHT KNEE LATERAL;  Surgeon: Ernie Donnice, MD;  Location: [facility] ORS;  Service: Orthopedics;  Laterality: Right;  90 mins   PARTIAL KNEE ARTHROPLASTY Left 06/29/2020   Procedure: UNICOMPARTMENTAL KNEE LATERALLY;  Surgeon: Ernie Donnice, MD;  Location: [facility];  Service: Orthopedics;  Laterality: Left;  70 MINS   TONSILLECTOMY     adenoids   Patient Active Problem List   Diagnosis Date Noted   OSA on CPAP 12/12/2022   DOE (dyspnea on exertion) 08/21/2022   Furuncles 02/08/2022   Infection of flexor tendon sheath 02/06/2022   Finger osteomyelitis, right (HCC) 02/06/2022   Leukocytosis 02/06/2022  History of COVID-19 02/06/2022   IBS (irritable bowel syndrome) 02/06/2022   Hypertriglyceridemia 02/06/2022   Obesity, Class III, BMI 40-49.9 (morbid obesity) (HCC) 02/06/2022   Lumbar facet joint syndrome 11/02/2021   Bilateral trigger thumb 06/18/2021   Chronic paronychia of finger 01/25/2021   S/P left unicompartmental knee replacement, lateral 06/29/2020   At risk for obstructive sleep apnea 06/11/2019   Cough 06/11/2019   Hyperglycemia 05/26/2019   Acute hypoxemic respiratory failure (HCC) 05/20/2019   Fibromyalgia 03/09/2019   Neck pain, chronic 03/09/2019   Acute shoulder bursitis, right 02/03/2019   Bile salt-induced diarrhea 09/25/2018   Arthritis of left acromioclavicular joint 09/23/2018   Precordial chest pain 08/11/2018    Morbid obesity (HCC) 10/31/2017   S/P right UKR, lateral 10/30/2017   Incarcerated epigastric hernia 05/30/2015   Left foot pain 08/09/2014   Chronic diarrhea 07/27/2014   Gout 08/10/2012   Acid reflux 01/22/2012   Down syndrome 11/11/2011   Hernia of abdominal wall 10/21/2011   Psoriasis 10/21/2011   Uncontrolled type 2 diabetes mellitus with hyperglycemia, with long-term current use of insulin  (HCC) 04/12/2011   Hypothyroid 04/12/2011   Gait abnormality 06/22/2010   Tibial tendinitis, posterior 06/22/2010    ONSET DATE: 10/30/23- referral date  REFERRING DIAG:  Diagnosis  M86.9 (ICD-10-CM) - Finger osteomyelitis, right (HCC)    THERAPY DIAG:  Stiffness of right hand, not elsewhere classified  Other disturbances of skin sensation  Other lack of coordination  Muscle weakness (generalized)  Stiffness of left hand, not elsewhere classified  Rationale for Evaluation and Treatment: Rehabilitation  SUBJECTIVE:   SUBJECTIVE STATEMENT: Pt reports baking for Christmas Pt accompanied by: self  PERTINENT HISTORY: hx of R long finger amputation through distal phalanx 04/11/22, healing wound at the edge of nail left long finger which is now closed PMH: Down's syndrome, DM   PRECAUTIONS: None  RED FLAGS: None   WEIGHT BEARING RESTRICTIONS: No  PAIN:  Are you having pain? Yes: NPRS scale: 4/10 Pain location: right middle finger Pain description: aching Aggravating factors: use, weather changes Relieving factors: rest Pt reports left shoulder pain today 6/10, heat alleviates overuse aggravates.  FALLS: Has patient fallen in last 6 months? Yes. Number of falls 1- PT addessed  LIVING ENVIRONMENT: Lives with: lives with their family and lives alone Lives in: House/apartment   PLOF: Independent with basic ADLs  PATIENT GOALS: improve use of right middle finger  NEXT MD VISIT: unknown  OBJECTIVE:  Note: Objective measures were completed at Evaluation unless otherwise  noted.  HAND DOMINANCE: Right  ADLs: Overall ADLs: mod I with all basic ADLS  FUNCTIONAL OUTCOME MEASURES: PSFS-5.3 Items-Pick up pills 5, cut food, 6, stirring batter and using RUE for baking 5  UPPER EXTREMITY ROM:      Active ROM Right eval   Thumb MCP (0-60)    Thumb IP (0-80)    Thumb Radial abd/add (0-55)    Thumb Palmar abd/add (0-45)     Thumb Opposition to Small Finger     Index MCP (0-90)     Index PIP (0-100)     Index DIP (0-70)      Long MCP (0-90) 80     Long PIP (0-100) 70     Long DIP (0-70)      Ring MCP (0-90)      Ring PIP (0-100)      Ring DIP (0-70)      Little MCP (0-90)      Little PIP (0-100)  Little DIP (0-70)      (Blank rows = not tested)   HAND FUNCTION: Grip strength: Right: 20 lbs; Left: 34 lbs  COORDINATION: 9 Hole Peg test: Right: 1 min 4 secs sec; Left: 1 min 8  sec   SENSATION: Light touch: Impaired in fingertip   EDEMA: Pt with R middle finger amputation at distal phalanx  COGNITION: Overall cognitive status: History of cognitive impairments - at baseline, mild impairment   OBSERVATIONS: Pleasant female    TREATMENT DATE:  12/23/2 hotpack x 5 mins to right hand for stiffness and pain, no adverse reactions Composite finger flexion for RUE, followed by PIP blocking for middle finger, min v.c  Reveiwed putty exercises for sustained grip and pinch, min v.c and demonstration Therapist placed pegs in pt's putty and had her use RUE to pinch and grip putty to remove pegs, min difficulty/ v.c for increased use of middle finger. (hotpack applied to left shoulder per pt's request x 15 mins  during these activities no adverse reactions. Therapist fabricated 2 different finger tip protectors as pt reports previous are too loose. Therapist reviewed splint wear, care and precuations with pt. She verbalized understanding   01/20/24- hotpack x 5 mins to right hand for stiffness and pain, no adverse reactions Composite finger  flexion for RUE, followed by PIP blocking for middle finger, min v.c  Massage to right middle finger  and palm for desensitization  Reveiwed putty exercises for sustained grip and pinch, min v.c and demonstration Therapist placed pegs in pt's putty and had her use RUE to pinch and grip putty to remove pegs, min difficulty/ v.c for increased use of middle finger. (hotpack applied to left shoulder per pt's request x 15 mins  during these activities no adverse reactions. Picking up checkers using middle and ring  for increased fine motor coordination and functional use, min v.c    01/06/24- Hotpack applied to right hand x6 mins  while therapist initated fabrication of fingertip protector. no adverse reactions to heat. Pt was fitted with an updated fingertip protector that fits better for right middle finger and instructed in wear/ care, and precautions.. Previously issued fingertip protecor would not stay on. Pt was noted to have cut on middle finger band aide applied prior to functional activity. Composite finger flexion/ extension followed by functional use of RUE to place and remove metal pegs from pegboard with RUE, min-mod v.c and demonstration for 3 pt pinch.  12/09/23 hotpack applied to right hand x 5 mins and left shoulder x 20 mins for pain and stiffness. No adverse reactions A/ROM exercises for PIP blocking, and active and passive composite finger flexion,   Pt performed red putty HEP, for composite grip and tip pinch min v.c and demonstration for RUE Pt was fitted with a fingertip protector splint for right middle finger and instructed in wear/ care, and precautions.  12/02/23 hotpack applied to right hand x 6 mins then left shoulder x 20 mins for pain and stiffness.  A/ROM exercises for PIP blocking, and active and passive composite finger flexion,   Pt performed red putty HEP, for composite grip and tip pinch min v.c and demonstration Flipping playing cards with RUE for increased  functional use. Foam grip applied to pen and pt practiced using for writing with improved legibility, min v.c for hand placement.  11/26/23- hotpack applied to right hand and left shoulder x 8 mins for pain and stiffness.  A/ROM exercises for PIP blocking, and active and passive composite  finger flexion, place and holds  Pt was instructed in red putty HEP, min v.c and demonstration   11/13/23- inital eval                                                                                                                                 PATIENT EDUCATION: Education details:red putty, fingertip protector wear, handout issued previously Person educated: Patient Education method: Explanation, demonstration, v.c, handout  Education comprehension: verbalized understanding, returned demonstration,   HOME EXERCISE PROGRAM: inital HEP 11/13/23\ putty  GOALS: Goals reviewed with patient? Yes  SHORT TERM GOALS: Target date: 12/13/23  I with inital HEP  Goal status:met, 12/02/23  2.  Pt will increase RUE middle finger PIP flexion to 75* for increased functional use.  Goal status:  ongoing 65*, 01/27/24  3.  Pt will improve RUE grip strength to 25# or better for increased functional use. Baseline: 20# Goal status: ongoing, 20# 01/27/24  4.  I with adapted strategies and adapted equipment to maximize pt's safety and I with ADLs/IADLs.   Goal status: ongoing adapted pen issued  01/20/24    LONG TERM GOALS: Target date: 02/05/24  I with updated HEP  Goal status: ongoing 12/23//25  2.  Pt will demonstrate improved RUE functional use as evidenced by increasing PSFS to 7. Baseline: 5.3 Goal status: ongoing 01/20/24  3.  I with R fingertip protector splint wear/ care and prec.  Baseline: dependnent Goal status: 12/23 /25 splint issued, ongoing    ASSESSMENT:  CLINICAL IMPRESSION:Pt is progressing towards goals. Pt was provided with updated fingertip protectors. she verbalizes  understanding of precautions. Pt demonstrates improved flexibility at PIP joint midle finger today.PERFORMANCE DEFICITS: in functional skills including ADLs, IADLs, coordination, dexterity, sensation, edema, ROM, strength, pain, flexibility, Fine motor control, Gross motor control, decreased knowledge of precautions, decreased knowledge of use of DME, and UE functional use, cognitive skills including problem solving, thought, and understand, and psychosocial skills including coping strategies, environmental adaptation, habits, interpersonal interactions, and routines and behaviors.   IMPAIRMENTS: are limiting patient from ADLs, IADLs, play, leisure, and social participation.   COMORBIDITIES: may have co-morbidities  that affects occupational performance. Patient will benefit from skilled OT to address above impairments and improve overall function.  MODIFICATION OR ASSISTANCE TO COMPLETE EVALUATION: No modification of tasks or assist necessary to complete an evaluation.  OT OCCUPATIONAL PROFILE AND HISTORY: Detailed assessment: Review of records and additional review of physical, cognitive, psychosocial history related to current functional performance.  CLINICAL DECISION MAKING: LOW - limited treatment options, no task modification necessary  REHAB POTENTIAL: Good  EVALUATION COMPLEXITY: Low      PLAN:  OT FREQUENCY: 1x/week  OT DURATION: 12 weeks  PLANNED INTERVENTIONS: 97168 OT Re-evaluation, 97535 self care/ADL training, 02889 therapeutic exercise, 97530 therapeutic activity, 97112 neuromuscular re-education, 97140 manual therapy, 97035 ultrasound, 97018 paraffin, 02989 moist heat, 97010 cryotherapy, 97014 electrical stimulation unattended, 97760 Orthotic Initial, 97763 Orthotic/Prosthetic subsequent,  scar mobilization, passive range of motion, energy conservation, coping strategies training, patient/family education, and DME and/or AE instructions  RECOMMENDED OTHER SERVICES:  n/a  CONSULTED AND AGREED WITH PLAN OF CARE:   PLAN FOR NEXT SESSION: check ROM,  anticipate d/c   Kohner Orlick, OT 01/27/2024, 4:08 PM   "

## 2024-02-03 ENCOUNTER — Ambulatory Visit: Admitting: Occupational Therapy

## 2024-02-03 ENCOUNTER — Encounter: Payer: Self-pay | Admitting: Occupational Therapy

## 2024-02-03 DIAGNOSIS — R208 Other disturbances of skin sensation: Secondary | ICD-10-CM

## 2024-02-03 DIAGNOSIS — M25641 Stiffness of right hand, not elsewhere classified: Secondary | ICD-10-CM | POA: Diagnosis not present

## 2024-02-03 DIAGNOSIS — R278 Other lack of coordination: Secondary | ICD-10-CM

## 2024-02-03 DIAGNOSIS — M6281 Muscle weakness (generalized): Secondary | ICD-10-CM

## 2024-02-03 DIAGNOSIS — M25642 Stiffness of left hand, not elsewhere classified: Secondary | ICD-10-CM

## 2024-02-03 NOTE — Therapy (Signed)
 " OUTPATIENT OCCUPATIONAL THERAPY ORTHO treatment  Patient Name: Wanda Oneill MRN: [id number] DOB:[birth year], 43 y.o., female Today's Date: 02/03/2024  PCP: Dr. Harlene Copland REFERRING PROVIDERDr. Harlene Copland  END OF SESSION:  OT End of Session - 02/03/24 1023     Visit Number 8    Number of Visits 12    Date for Recertification  02/05/24    Authorization Type [employer]    Authorization - Visit Number 7    Progress Note Due on Visit 10    OT Start Time 1019    OT Stop Time 1059    OT Time Calculation (min) 40 min    Activity Tolerance Patient tolerated treatment well    Behavior During Therapy WFL for tasks assessed/performed           Past Medical History:  Diagnosis Date   Ambulates with cane    4 prong   Diabetes mellitus    type II   Down syndrome    Down's syndrome    Family history of adverse reaction to anesthesia    mom has had n/v   Gastritis    Headache    Hepatic steatosis    Hidradenitis    Hypothyroidism    Irritable bowel syndrome    Osteoarthritis    Periumbilical hernia    Pneumonia 03/2020   frequent    Restless legs    Sleep apnea    Tendonitis    chronic in left foot   Thyroid  disease    hypothyroidism   Urinary incontinence 02/2022   some   UTI (urinary tract infection)    just dx 04/2022 - rx amoxicillin    Past Surgical History:  Procedure Laterality Date   AMPUTATION Right 04/11/2022   Procedure: RIGHT LONG FINGER AMPUTATION;  Surgeon: Murrell Drivers, MD;  Location: [facility] OR;  Service: Orthopedics;  Laterality: Right;   AXILLARY HIDRADENITIS EXCISION Bilateral    CHOLECYSTECTOMY     HERNIA REPAIR     multiple   INCISION AND DRAINAGE ABSCESS Right 02/06/2022   Procedure: INCISION AND DRAINAGE RIGHT HAND;  Surgeon: Murrell Drivers, MD;  Location: [facility] OR;  Service: Orthopedics;  Laterality: Right;   INCISION AND DRAINAGE WOUND WITH NAILBED REPAIR Left 02/06/2022   Procedure: INCISION AND DRAINAGE LEFT INDEX FINGER;  Surgeon:  Murrell Drivers, MD;  Location: [facility] OR;  Service: Orthopedics;  Laterality: Left;   INCISIONAL HERNIA REPAIR N/A 05/30/2015   Procedure: LAPAROSCOPIC INCISIONAL HERNIA;  Surgeon: Donnice Bury, MD;  Location: [facility] OR;  Service: General;  Laterality: N/A;   INSERTION OF MESH N/A 05/30/2015   Procedure: INSERTION OF MESH;  Surgeon: Donnice Bury, MD;  Location: [facility] OR;  Service: General;  Laterality: N/A;   KNEE ARTHROSCOPY     right knee   LAPAROSCOPY N/A 05/30/2015   Procedure: LAPAROSCOPY DIAGNOSTIC;  Surgeon: Donnice Bury, MD;  Location[facility] OR;  Service: General;  Laterality: N/A;   PARTIAL KNEE ARTHROPLASTY Right 10/30/2017   Procedure: UNICOMPARTMENTAL RIGHT KNEE LATERAL;  Surgeon: Ernie Donnice, MD;  Location: [facility] ORS;  Service: Orthopedics;  Laterality: Right;  90 mins   PARTIAL KNEE ARTHROPLASTY Left 06/29/2020   Procedure: UNICOMPARTMENTAL KNEE LATERALLY;  Surgeon: Ernie Donnice, MD;  Location: [facility] OR[facility];  Service: Orthopedics;  Laterality: Left;  70 MINS   TONSILLECTOMY     adenoids   Patient Active Problem List   Diagnosis Date Noted   OSA on CPAP 12/12/2022   DOE (dyspnea on exertion) 08/21/2022   Furuncles 02/08/2022  Infection of flexor tendon sheath 02/06/2022   Finger osteomyelitis, right (HCC) 02/06/2022   Leukocytosis 02/06/2022   History of COVID-19 02/06/2022   IBS (irritable bowel syndrome) 02/06/2022   Hypertriglyceridemia 02/06/2022   Obesity, Class III, BMI 40-49.9 (morbid obesity) (HCC) 02/06/2022   Lumbar facet joint syndrome 11/02/2021   Bilateral trigger thumb 06/18/2021   Chronic paronychia of finger 01/25/2021   S/P left unicompartmental knee replacement, lateral 06/29/2020   At risk for obstructive sleep apnea 06/11/2019   Cough 06/11/2019   Hyperglycemia 05/26/2019   Acute hypoxemic respiratory failure (HCC) 05/20/2019   Fibromyalgia 03/09/2019   Neck pain, chronic 03/09/2019   Acute shoulder bursitis, right 02/03/2019   Bile salt-induced  diarrhea 09/25/2018   Arthritis of left acromioclavicular joint 09/23/2018   Precordial chest pain 08/11/2018   Morbid obesity (HCC) 10/31/2017   S/P right UKR, lateral 10/30/2017   Incarcerated epigastric hernia 05/30/2015   Left foot pain 08/09/2014   Chronic diarrhea 07/27/2014   Gout 08/10/2012   Acid reflux 01/22/2012   Down syndrome 11/11/2011   Hernia of abdominal wall 10/21/2011   Psoriasis 10/21/2011   Uncontrolled type 2 diabetes mellitus with hyperglycemia, with long-term current use of insulin  (HCC) 04/12/2011   Hypothyroid 04/12/2011   Gait abnormality 06/22/2010   Tibial tendinitis, posterior 06/22/2010    ONSET DATE: 10/30/23- referral date  REFERRING DIAG:  Diagnosis  M86.9 (ICD-10-CM) - Finger osteomyelitis, right (HCC)    THERAPY DIAG:  Stiffness of right hand, not elsewhere classified  Other disturbances of skin sensation  Other lack of coordination  Muscle weakness (generalized)  Stiffness of left hand, not elsewhere classified  Rationale for Evaluation and Treatment: Rehabilitation  SUBJECTIVE:   SUBJECTIVE STATEMENT: Pt reports baking for Christmas Pt accompanied by: self  PERTINENT HISTORY: hx of R long finger amputation through distal phalanx 04/11/22, healing wound at the edge of nail left long finger which is now closed PMH: Down's syndrome, DM   PRECAUTIONS: None  RED FLAGS: None   WEIGHT BEARING RESTRICTIONS: No  PAIN:  Are you having pain? Yes: NPRS scale: 4/10 Pain location: right middle finger Pain description: aching Aggravating factors: use, weather changes Relieving factors: rest Pt reports left shoulder pain today 6/10, heat alleviates overuse aggravates.  FALLS: Has patient fallen in last 6 months? Yes. Number of falls 1- PT addessed  LIVING ENVIRONMENT: Lives with: lives with their family and lives alone Lives in: House/apartment   PLOF: Independent with basic ADLs  PATIENT GOALS: improve use of right middle  finger  NEXT MD VISIT: unknown  OBJECTIVE:  Note: Objective measures were completed at Evaluation unless otherwise noted.  HAND DOMINANCE: Right  ADLs: Overall ADLs: mod I with all basic ADLS  FUNCTIONAL OUTCOME MEASURES: PSFS-5.3 Items-Pick up pills 5, cut food, 6, stirring batter and using RUE for baking 5 02/03/24- Pick up pills 5, cut up food 7, using RUE for baking-8  UPPER EXTREMITY ROM:      Active ROM Right eval   Thumb MCP (0-60)    Thumb IP (0-80)    Thumb Radial abd/add (0-55)    Thumb Palmar abd/add (0-45)     Thumb Opposition to Small Finger     Index MCP (0-90)     Index PIP (0-100)     Index DIP (0-70)      Long MCP (0-90) 80     Long PIP (0-100) 70     Long DIP (0-70)      Ring MCP (0-90)  Ring PIP (0-100)      Ring DIP (0-70)      Little MCP (0-90)      Little PIP (0-100)      Little DIP (0-70)      (Blank rows = not tested)   HAND FUNCTION: Grip strength: Right: 20 lbs; Left: 34 lbs  COORDINATION: 9 Hole Peg test: Right: 1 min 4 secs sec; Left: 1 min 8  sec   SENSATION: Light touch: Impaired in fingertip   EDEMA: Pt with R middle finger amputation at distal phalanx  COGNITION: Overall cognitive status: History of cognitive impairments - at baseline, mild impairment   OBSERVATIONS: Pleasant female    TREATMENT DATE:  02/03/24-hotpack x 5 mins to right hand for stiffness and pain, no adverse reactions Composite finger flexion for RUE, gentle passive stretch followed by PIP blocking for middle finger, min v.c  Reviewed putty exercises for sustained grip and pinch, pulling small pegs from putty min v.c and demonstration Picking up pegs with RUE to place in  with tip pinch, min v.c container for increased fine motor coordination.  Writing activity with foam grip, pt with good legibility/ letter size. Assessed fit of fingertip protector, most recent splint fits well without falling off. Checked progress towards goals. A/ROM Right   Middle finger PIP flexion 65, MP flexion 75*, grossly 90-95% composite finger flexion   12/23/2 hotpack x 5 mins to right hand for stiffness and pain, no adverse reactions Composite finger flexion for RUE, followed by PIP blocking for middle finger, min v.c  Reveiwed putty exercises for sustained grip and pinch, min v.c and demonstration Therapist placed pegs in pt's putty and had her use RUE to pinch and grip putty to remove pegs, min difficulty/ v.c for increased use of middle finger. (hotpack applied to left shoulder per pt's request x 15 mins  during these activities no adverse reactions. Therapist fabricated 2 different finger tip protectors as pt reports previous are too loose. Therapist reviewed splint wear, care and precuations with pt. She verbalized understanding   01/20/24- hotpack x 5 mins to right hand for stiffness and pain, no adverse reactions Composite finger flexion for RUE, followed by PIP blocking for middle finger, min v.c  Massage to right middle finger  and palm for desensitization  Reveiwed putty exercises for sustained grip and pinch, min v.c and demonstration Therapist placed pegs in pt's putty and had her use RUE to pinch and grip putty to remove pegs, min difficulty/ v.c for increased use of middle finger. (hotpack applied to left shoulder per pt's request x 15 mins  during these activities no adverse reactions. Picking up checkers using middle and ring  for increased fine motor coordination and functional use, min v.c    01/06/24- Hotpack applied to right hand x6 mins  while therapist initated fabrication of fingertip protector. no adverse reactions to heat. Pt was fitted with an updated fingertip protector that fits better for right middle finger and instructed in wear/ care, and precautions.. Previously issued fingertip protecor would not stay on. Pt was noted to have cut on middle finger band aide applied prior to functional activity. Composite finger flexion/  extension followed by functional use of RUE to place and remove metal pegs from pegboard with RUE, min-mod v.c and demonstration for 3 pt pinch.  12/09/23 hotpack applied to right hand x 5 mins and left shoulder x 20 mins for pain and stiffness. No adverse reactions A/ROM exercises for PIP blocking, and active and  passive composite finger flexion,   Pt performed red putty HEP, for composite grip and tip pinch min v.c and demonstration for RUE Pt was fitted with a fingertip protector splint for right middle finger and instructed in wear/ care, and precautions.  12/02/23 hotpack applied to right hand x 6 mins then left shoulder x 20 mins for pain and stiffness.  A/ROM exercises for PIP blocking, and active and passive composite finger flexion,   Pt performed red putty HEP, for composite grip and tip pinch min v.c and demonstration Flipping playing cards with RUE for increased functional use. Foam grip applied to pen and pt practiced using for writing with improved legibility, min v.c for hand placement.  11/26/23- hotpack applied to right hand and left shoulder x 8 mins for pain and stiffness.  A/ROM exercises for PIP blocking, and active and passive composite finger flexion, place and holds  Pt was instructed in red putty HEP, min v.c and demonstration   11/13/23- inital eval                                                                                                                                 PATIENT EDUCATION: Education details:red putty, picking up pegs with RUE for pinch, plans for d/c Person educated: Patient Education method: Explanation, demonstration, v.c,  Education comprehension: verbalized understanding, returned demonstration,   HOME EXERCISE PROGRAM: inital HEP 11/13/23\ putty  GOALS: Goals reviewed with patient? Yes  SHORT TERM GOALS: Target date: 12/13/23  I with inital HEP  Goal status:met, 12/02/23  2.  Pt will increase RUE middle finger PIP flexion  to 75* for increased functional use.  Goal status:  not met 65*-70*, 02/03/24  3.  Pt will improve RUE grip strength to 25# or better for increased functional use. Baseline: 20# Goal status: not met, 20# 02/03/24  4.  I with adapted strategies and adapted equipment to maximize pt's safety and I with ADLs/IADLs.   Goal status: met adapted pen issued  01/20/24    LONG TERM GOALS: Target date: 02/05/24  I with updated HEP  Goal status: met picking up pegs, pulling pegs out of putty 02/03/24  2.  Pt will demonstrate improved RUE functional use as evidenced by increasing PSFS to 7. Baseline: 5.3 Goal status: improved 6.66 but not fully met 02/03/24  3.  I with R fingertip protector splint wear/ care and prec.  Baseline: dependnent Goal status: met, fingertip protector issued, last fitting fits well without issues.02/03/24    ASSESSMENT:  CLINICAL IMPRESSION:Pt  made overall progress towards goals. She achieved 2/3 long term goals. Pt demonstrates understanding of HEP.  SABRAPERFORMANCE DEFICITS: in functional skills including ADLs, IADLs, coordination, dexterity, sensation, edema, ROM, strength, pain, flexibility, Fine motor control, Gross motor control, decreased knowledge of precautions, decreased knowledge of use of DME, and UE functional use, cognitive skills including problem solving, thought, and understand, and psychosocial skills including coping strategies, environmental adaptation, habits,  interpersonal interactions, and routines and behaviors.   IMPAIRMENTS: are limiting patient from ADLs, IADLs, play, leisure, and social participation.   COMORBIDITIES: may have co-morbidities  that affects occupational performance. Patient will benefit from skilled OT to address above impairments and improve overall function.  MODIFICATION OR ASSISTANCE TO COMPLETE EVALUATION: No modification of tasks or assist necessary to complete an evaluation.  OT OCCUPATIONAL PROFILE AND HISTORY:  Detailed assessment: Review of records and additional review of physical, cognitive, psychosocial history related to current functional performance.  CLINICAL DECISION MAKING: LOW - limited treatment options, no task modification necessary  REHAB POTENTIAL: Good  EVALUATION COMPLEXITY: Low      PLAN:  OT FREQUENCY: 1x/week  OT DURATION: 12 weeks  PLANNED INTERVENTIONS: 97168 OT Re-evaluation, 97535 self care/ADL training, 02889 therapeutic exercise, 97530 therapeutic activity, 97112 neuromuscular re-education, 97140 manual therapy, 97035 ultrasound, 97018 paraffin, 02989 moist heat, 97010 cryotherapy, 97014 electrical stimulation unattended, 97760 Orthotic Initial, 97763 Orthotic/Prosthetic subsequent, scar mobilization, passive range of motion, energy conservation, coping strategies training, patient/family education, and DME and/or AE instructions  RECOMMENDED OTHER SERVICES: n/a  CONSULTED AND AGREED WITH PLAN OF CARE:   PLAN FOR NEXT SESSION:  d/c OT   Demetress Tift, OT 02/03/2024, 10:25 AM   "

## 2024-02-04 ENCOUNTER — Encounter: Payer: Self-pay | Admitting: Family Medicine

## 2024-02-04 ENCOUNTER — Other Ambulatory Visit: Payer: Self-pay | Admitting: Family Medicine

## 2024-02-04 DIAGNOSIS — R928 Other abnormal and inconclusive findings on diagnostic imaging of breast: Secondary | ICD-10-CM

## 2024-02-04 DIAGNOSIS — R2232 Localized swelling, mass and lump, left upper limb: Secondary | ICD-10-CM

## 2024-02-09 ENCOUNTER — Other Ambulatory Visit: Payer: Self-pay | Admitting: Family Medicine

## 2024-02-09 MED ORDER — DEXCOM G7 15 DAY SENSOR MISC: 1.0000 | 3 refills | Status: AC

## 2024-02-17 ENCOUNTER — Encounter: Payer: Self-pay | Admitting: Family Medicine

## 2024-02-17 ENCOUNTER — Ambulatory Visit
Admission: RE | Admit: 2024-02-17 | Discharge: 2024-02-17 | Disposition: A | Source: Ambulatory Visit | Attending: Family Medicine | Admitting: Family Medicine

## 2024-02-17 DIAGNOSIS — R2232 Localized swelling, mass and lump, left upper limb: Secondary | ICD-10-CM

## 2024-03-09 ENCOUNTER — Ambulatory Visit: Admitting: *Deleted

## 2024-03-09 ENCOUNTER — Telehealth (HOSPITAL_BASED_OUTPATIENT_CLINIC_OR_DEPARTMENT_OTHER): Payer: Self-pay

## 2024-03-09 ENCOUNTER — Other Ambulatory Visit (HOSPITAL_BASED_OUTPATIENT_CLINIC_OR_DEPARTMENT_OTHER): Payer: Self-pay

## 2024-03-09 VITALS — BP 130/78 | HR 89 | Temp 98.1°F | Resp 20 | Ht 63.5 in | Wt 259.4 lb

## 2024-03-09 DIAGNOSIS — N926 Irregular menstruation, unspecified: Secondary | ICD-10-CM

## 2024-03-09 DIAGNOSIS — Z0000 Encounter for general adult medical examination without abnormal findings: Secondary | ICD-10-CM

## 2024-03-09 MED ORDER — NORETHINDRONE ACETATE 5 MG PO TABS: ORAL_TABLET | ORAL | 0 refills | Status: AC

## 2024-03-09 NOTE — Progress Notes (Signed)
 "  Please attest this visit in the absence of patient primary care provider.   Chief Complaint  Patient presents with   Medicare Wellness     Subjective:   Wanda Oneill is a 44 y.o. female who presents for a Medicare Annual Wellness Visit.  Visit info / Clinical Intake: Medicare Wellness Visit Type:: Subsequent Annual Wellness Visit Persons participating in visit and providing information:: patient & caregiver (mother) Medicare Wellness Visit Mode:: In-person (required for WTM) Interpreter Needed?: No Pre-visit prep was completed: yes AWV questionnaire completed by patient prior to visit?: no Living arrangements:: (!) lives alone Patient's Overall Health Status Rating: good Typical amount of pain: some (some to a lot) Does pain affect daily life?: (!) yes Are you currently prescribed opioids?: no  Dietary Habits and Nutritional Risks How many meals a day?: 3 (some snacks) Eats fruit and vegetables daily?: yes Most meals are obtained by: preparing own meals In the last 2 weeks, have you had any of the following?: (!) nausea, vomiting, diarrhea (has IBS and has intermittent diarrhea) Diabetic:: (!) yes Any non-healing wounds?: no How often do you check your BS?: continuous glucose monitor Would you like to be referred to a Nutritionist or for Diabetic Management? : no  Functional Status Activities of Daily Living (to include ambulation/medication): Independent Ambulation: Independent with device- listed below Home Assistive Devices/Equipment: Cane Medication Administration: Independent Home Management (perform basic housework or laundry): Independent Manage your own finances?: yes Primary transportation is: family / friends Concerns about vision?: no *vision screening is required for WTM* (up to date with [facility] and [facility]) Concerns about hearing?: no  Fall Screening Falls in the past year?: 1 Number of falls in past year: 1 (8-10, has history of surgery  on both knees and it pops.  Uses cane while out walking.) Was there an injury with Fall?: 0 Fall Risk Category Calculator: 2 Patient Fall Risk Level: Moderate Fall Risk  Fall Risk Patient at Risk for Falls Due to: Impaired balance/gait; History of fall(s) Fall risk Follow up: Falls evaluation completed  Home and Transportation Safety: All rugs have non-skid backing?: yes All stairs or steps have railings?: N/A, no stairs Grab bars in the bathtub or shower?: yes Have non-skid surface in bathtub or shower?: yes Good home lighting?: yes Regular seat belt use?: yes Hospital stays in the last year:: no  Cognitive Assessment Difficulty concentrating, remembering, or making decisions? : no Will 6CIT or Mini Cog be Completed: yes What year is it?: 0 points What month is it?: 0 points Give patient an address phrase to remember (5 components): [address], [place]  About what time is it?: 0 points Count backwards from 20 to 1: 0 points Say the months of the year in reverse: 0 points Repeat the address phrase from earlier: 6 points 6 CIT Score: 6 points  Advance Directives (For Healthcare) Does Patient Have a Medical Advance Directive?: No Would patient like information on creating a medical advance directive?: Yes (MAU/Ambulatory/Procedural Areas - Information given)  Reviewed/Updated  Reviewed/Updated: Reviewed All (Medical, Surgical, Family, Medications, Allergies, Care Teams, Patient Goals)    Allergies (verified) Vancomycin , Cefuroxime axetil, Sulfa antibiotics, and Pneumococcal vaccine   Current Medications (verified) Outpatient Encounter Medications as of 03/09/2024  Medication Sig   acetaminophen  (TYLENOL ) 500 MG tablet Take 500 mg by mouth every 8 (eight) hours as needed.   Albuterol  Sulfate (PROAIR  RESPICLICK) 108 (90 Base) MCG/ACT AEPB Inhale 2 puffs every 4-6 hours as needed  allopurinol  (ZYLOPRIM ) 300 MG tablet Take 1 tablet (300 mg total) by mouth  daily.   Blood Glucose Monitoring Suppl (BLOOD GLUCOSE METER KIT AND SUPPLIES) Dispense based on patient and insurance preference. To check blood sugars daily FOR ICD-9 250.00   budesonide -formoterol (SYMBICORT) 160-4.5 MCG/ACT inhaler Inhale 2 puffs into the lungs 2 (two) times daily.   cholestyramine  (QUESTRAN ) 4 g packet MIX 1 PACKET IN LIQUID AND DRINK TWICE DAILY   Continuous Glucose Sensor (DEXCOM G7 15 DAY SENSOR) MISC 1 each by Does not apply route every 14 (fourteen) days.   dicyclomine  (BENTYL ) 20 MG tablet Take 1 tablet (20 mg total) by mouth 3 (three) times daily as needed for spasms.   fenofibrate  micronized (LOFIBRA) 134 MG capsule Take 134 mg by mouth daily before breakfast.   gabapentin  (NEURONTIN ) 100 MG capsule Take 1 capsule (100 mg total) by mouth at bedtime.   Glucose Blood (BLOOD GLUCOSE TEST STRIPS) STRP 1 each by Other route See admin instructions. Use to test blood sugar up to twice a day   glucose blood (FREESTYLE TEST STRIPS) test strip Use as directed to test blood sugar up to twice a day. Dx code E11.9   insulin  aspart (NOVOLOG ) 100 UNIT/ML injection Inject 30-50 Units into the skin 3 (three) times daily before meals.   Insulin  Degludec (TRESIBA) 100 UNIT/ML SOLN Inject 50 Units into the skin at bedtime.   levothyroxine  (SYNTHROID ) 150 MCG tablet TAKE 1 TABLET DAILY BEFORE BREAKFAST (NEED APPOINTMENT AND LABS BEFORE NEXT REFILL) (Patient taking differently: Take 224 mcg by mouth every evening.)   losartan  (COZAAR ) 25 MG tablet Take 0.5 tablets (12.5 mg total) by mouth daily.   metFORMIN  (GLUCOPHAGE ) 1000 MG tablet TAKE 1 TABLET TWICE DAILY WITH A MEAL (NEED APPOINTMENT AND LABS BEFORE NEXT REFILL)   methocarbamol  (ROBAXIN ) 500 MG tablet Take 1 tablet (500 mg total) by mouth every 8 (eight) hours as needed for muscle spasms.   MOUNJARO  10 MG/0.5ML Pen Inject into the skin.   naproxen  sodium (ALEVE ) 220 MG tablet Take 440 mg by mouth daily. (Patient taking differently:  Take 220 mg by mouth in the morning and at bedtime.)   norethindrone  (AYGESTIN ) 5 MG tablet TAKE 2 TABLETS BY MOUTH IN THE MORNING AND 1 TABLET BY MOUTH AT BEDTIME.   ondansetron  (ZOFRAN ) 4 MG tablet Take 1 tablet (4 mg total) by mouth every 8 (eight) hours as needed for nausea or vomiting.   rosuvastatin  (CRESTOR ) 10 MG tablet Take 1 tablet (10 mg total) by mouth daily.   TRUEplus Lancets 30G MISC Use as directed to check glucose up to two times daily. Dx code E11.9   [DISCONTINUED] albuterol  (VENTOLIN  HFA) 108 (90 Base) MCG/ACT inhaler Inhale 2 puffs into the lungs every 6 (six) hours as needed for wheezing or shortness of breath.   [DISCONTINUED] Fluticasone  Furoate (ARNUITY ELLIPTA ) 100 MCG/ACT AEPB Inhale 1 puff into the lungs daily.   [DISCONTINUED] albuterol  (PROVENTIL ) (2.5 MG/3ML) 0.083% nebulizer solution Take 3 mLs (2.5 mg total) by nebulization every 6 (six) hours as needed for wheezing or shortness of breath.   [DISCONTINUED] benzonatate  (TESSALON ) 100 MG capsule Take 1 capsule (100 mg total) by mouth 3 (three) times daily as needed for cough.   [DISCONTINUED] Continuous Glucose Sensor (DEXCOM G6 SENSOR) MISC SMARTSIG:1 Each Topical Every 10 Days   [DISCONTINUED] Continuous Glucose Transmitter (DEXCOM G6 TRANSMITTER) MISC    [DISCONTINUED] diphenhydramine -acetaminophen  (TYLENOL  PM) 25-500 MG TABS tablet Take 500 mg by mouth at bedtime.   [  DISCONTINUED] glucose blood (TRUE METRIX BLOOD GLUCOSE TEST) test strip Use as instructed to check glucose up to 2 times daily. Dx Code E11.9   [DISCONTINUED] Insulin  Disposable Pump (OMNIPOD 5 DEXG7G6 INTRO GEN 5) KIT SMARTSIG:SUB-Q Every Other Day   [DISCONTINUED] Insulin  Disposable Pump (OMNIPOD 5 DEXG7G6 PODS GEN 5) MISC Inject into the skin as directed.   [DISCONTINUED] JARDIANCE  25 MG TABS tablet TAKE 1 TABLET EVERY DAY (Patient not taking: Reported on 03/09/2024)   [DISCONTINUED] levofloxacin  (LEVAQUIN ) 750 MG tablet Take 1 tablet (750 mg total) by  mouth daily.   [DISCONTINUED] ONE TOUCH ULTRA TEST test strip USE TO CHECK BLOOD SUGAR ONCE DAILY (ICD CODE:25.00)   [DISCONTINUED] pantoprazole  (PROTONIX ) 40 MG tablet Take 1 tablet (40 mg total) by mouth daily. Take 30-60 min before first meal of the day (Patient not taking: Reported on 03/09/2024)   [DISCONTINUED] Spacer/Aero-Holding Raguel FRENCH Use with inhalers   No facility-administered encounter medications on file as of 03/09/2024.    History: Past Medical History:  Diagnosis Date   Ambulates with cane    4 prong   Diabetes mellitus    type II   Down syndrome    Down's syndrome    Family history of adverse reaction to anesthesia    mom has had n/v   Gastritis    Headache    Hepatic steatosis    Hidradenitis    Hypothyroidism    Irritable bowel syndrome    Osteoarthritis    Periumbilical hernia    Pneumonia 03/2020   frequent    Restless legs    Sleep apnea    Tendonitis    chronic in left foot   Thyroid  disease    hypothyroidism   Urinary incontinence 02/2022   some   UTI (urinary tract infection)    just dx 04/2022 - rx amoxicillin    Past Surgical History:  Procedure Laterality Date   AMPUTATION Right 04/11/2022   Procedure: RIGHT LONG FINGER AMPUTATION;  Surgeon: Murrell Drivers, MD;  Location: [facility] OR;  Service: Orthopedics;  Laterality: Right;   AXILLARY HIDRADENITIS EXCISION Bilateral    CHOLECYSTECTOMY     HERNIA REPAIR     multiple   INCISION AND DRAINAGE ABSCESS Right 02/06/2022   Procedure: INCISION AND DRAINAGE RIGHT HAND;  Surgeon: Murrell Drivers, MD;  Location: [facility] OR;  Service: Orthopedics;  Laterality: Right;   INCISION AND DRAINAGE WOUND WITH NAILBED REPAIR Left 02/06/2022   Procedure: INCISION AND DRAINAGE LEFT INDEX FINGER;  Surgeon: Murrell Drivers, MD;  Location: [facility] OR;  Service: Orthopedics;  Laterality: Left;   INCISIONAL HERNIA REPAIR N/A 05/30/2015   Procedure: LAPAROSCOPIC INCISIONAL HERNIA;  Surgeon: Donnice Bury, MD;  Location: [facility] OR;   Service: General;  Laterality: N/A;   INSERTION OF MESH N/A 05/30/2015   Procedure: INSERTION OF MESH;  Surgeon: Donnice Bury, MD;  Location: [facility] OR;  Service: General;  Laterality: N/A;   KNEE ARTHROSCOPY     right knee   LAPAROSCOPY N/A 05/30/2015   Procedure: LAPAROSCOPY DIAGNOSTIC;  Surgeon: Donnice Bury, MD;  Location[facility] OR;  Service: General;  Laterality: N/A;   PARTIAL KNEE ARTHROPLASTY Right 10/30/2017   Procedure: UNICOMPARTMENTAL RIGHT KNEE LATERAL;  Surgeon: Ernie Donnice, MD;  Location: [facility] ORS;  Service: Orthopedics;  Laterality: Right;  90 mins   PARTIAL KNEE ARTHROPLASTY Left 06/29/2020   Procedure: UNICOMPARTMENTAL KNEE LATERALLY;  Surgeon: Ernie Donnice, MD;  Location: [facility] ORS;  Service: Orthopedics;  Laterality: Left;  70 MINS   TONSILLECTOMY  adenoids   Family History  Problem Relation Age of Onset   Thyroid  cancer Mother    Diabetes type II Mother    High Cholesterol Mother    Heart disease Mother    Diabetes type II Father    Hypertension Father    Stroke Father    Social History   Occupational History   Occupation: disabled  Tobacco Use   Smoking status: Never    Passive exposure: Never   Smokeless tobacco: Never  Vaping Use   Vaping status: Never Used  Substance and Sexual Activity   Alcohol use: No    Alcohol/week: 0.0 standard drinks of alcohol   Drug use: No   Sexual activity: Not Currently    Comment: norethindrone    Tobacco Counseling Counseling given: Not Answered  SDOH Screenings   Food Insecurity: No Food Insecurity (03/09/2024)  Housing: Low Risk (03/09/2024)  Transportation Needs: No Transportation Needs (03/09/2024)  Utilities: Not At Risk (03/09/2024)  Depression (PHQ2-9): Low Risk (03/09/2024)  Financial Resource Strain: Low Risk (01/09/2022)  Physical Activity: Insufficiently Active (03/09/2024)  Social Connections: Moderately Integrated (03/09/2024)  Stress: No Stress Concern Present (03/09/2024)  Tobacco Use: Low Risk (03/09/2024)    See flowsheets for full screening details  Depression Screen PHQ 2 & 9 Depression Scale- Over the past 2 weeks, how often have you been bothered by any of the following problems? Little interest or pleasure in doing things: 0 Feeling down, depressed, or hopeless (PHQ Adolescent also includes...irritable): 0 PHQ-2 Total Score: 0 Trouble falling or staying asleep, or sleeping too much: 1 (takes time to relax my brain. Uses Bi-pap) Feeling tired or having little energy: 1 (depends on how busy I get) Poor appetite or overeating (PHQ Adolescent also includes...weight loss): 0 Feeling bad about yourself - or that you are a failure or have let yourself or your family down: 0 Trouble concentrating on things, such as reading the newspaper or watching television (PHQ Adolescent also includes...like school work): 0 Moving or speaking so slowly that other people could have noticed. Or the opposite - being so fidgety or restless that you have been moving around a lot more than usual: 0 Thoughts that you would be better off dead, or of hurting yourself in some way: 0 PHQ-9 Total Score: 2 If you checked off any problems, how difficult have these problems made it for you to do your work, take care of things at home, or get along with other people?: Not difficult at all  Depression Treatment Depression Interventions/Treatment : EYV7-0 Score <4 Follow-up Not Indicated     Goals Addressed   None          Objective:    Today's Vitals   03/09/24 1502 03/09/24 1608  BP: (!) 141/78 130/78  Pulse: 89   Resp: 20   Temp: 98.1 F (36.7 C)   TempSrc: Oral   SpO2: 96%   Weight: 259 lb 6.4 oz (117.7 kg)   Height: 5' 3.5 (1.613 m)    Body mass index is 45.23 kg/m.  Hearing/Vision screen No results found. Immunizations and Health Maintenance Health Maintenance  Topic Date Due   Hepatitis C Screening  Never done   Pneumococcal Vaccine (1 of 2 - PCV) Never done   Hepatitis B Vaccines 19-59  Average Risk (1 of 3 - 19+ 3-dose series) Never done   FOOT EXAM  12/20/2021   Diabetic kidney evaluation - Urine ACR  05/26/2024   HEMOGLOBIN A1C  05/26/2024   COVID-19 Vaccine (  7 - Moderna risk 2025-26 season) 05/27/2024   OPHTHALMOLOGY EXAM  08/19/2024   Diabetic kidney evaluation - eGFR measurement  12/17/2024   Medicare Annual Wellness (AWV)  03/09/2025   Mammogram  01/21/2026   Cervical Cancer Screening (HPV/Pap Cotest)  05/31/2026   DTaP/Tdap/Td (2 - Td or Tdap) 03/06/2027   Influenza Vaccine  Completed   HPV VACCINES (No Doses Required) Completed   HIV Screening  Completed   Meningococcal B Vaccine  Aged Out        Assessment/Plan:  This is a routine wellness examination for [place].  Patient Care Team: Copland, Harlene BROCKS, MD as PCP - General (Family Medicine) Curtis Debby PARAS, MD as Consulting Physician (Sports Medicine)  I have personally reviewed and noted the following in the patients chart:   Medical and social history Use of alcohol, tobacco or illicit drugs  Current medications and supplements including opioid prescriptions. Functional ability and status Nutritional status Physical activity Advanced directives List of other physicians Hospitalizations, surgeries, and ER visits in previous 12 months Vitals Screenings to include cognitive, depression, and falls Referrals and appointments  No orders of the defined types were placed in this encounter.  In addition, I have reviewed and discussed with patient certain preventive protocols, quality metrics, and best practice recommendations. A written personalized care plan for preventive services as well as general preventive health recommendations were provided to patient.   Novalie Leamy, CMA   03/09/2024   Return in 1 year (on 03/09/2025).  After Visit Summary: (In Person-Printed) AVS printed and given to the patient  Nurse Notes: HM Addressed: Diabetic Foot Exam recommended Labs Due Hepatitis C  screen  Due for Hepatitis B vaccine(will check records, this may have been done).   "

## 2024-03-09 NOTE — Telephone Encounter (Signed)
 Refill sent to patient preferred pharmacy. Spoke with patient's mother, patient needs to be scheduled for an appointment. She will call back once she can access her calendar.   Morna LOISE Quale, RN

## 2024-06-17 ENCOUNTER — Ambulatory Visit: Admitting: Family Medicine

## 2025-03-17 ENCOUNTER — Ambulatory Visit
